# Patient Record
Sex: Male | Born: 1935 | Race: White | Hispanic: No | Smoking: Former smoker
Health system: Southern US, Community
[De-identification: ages and names within clinical notes are randomized; demographics above are authoritative.]

## PROBLEM LIST (undated history)

## (undated) DIAGNOSIS — K635 Polyp of colon: Secondary | ICD-10-CM

## (undated) DIAGNOSIS — I10 Essential (primary) hypertension: Secondary | ICD-10-CM

## (undated) DIAGNOSIS — R52 Pain, unspecified: Secondary | ICD-10-CM

## (undated) DIAGNOSIS — M5412 Radiculopathy, cervical region: Secondary | ICD-10-CM

## (undated) DIAGNOSIS — N189 Chronic kidney disease, unspecified: Secondary | ICD-10-CM

## (undated) DIAGNOSIS — I4891 Unspecified atrial fibrillation: Secondary | ICD-10-CM

## (undated) DIAGNOSIS — I4510 Unspecified right bundle-branch block: Secondary | ICD-10-CM

## (undated) DIAGNOSIS — Z9981 Dependence on supplemental oxygen: Secondary | ICD-10-CM

## (undated) DIAGNOSIS — J449 Chronic obstructive pulmonary disease, unspecified: Secondary | ICD-10-CM

## (undated) DIAGNOSIS — S8291XA Unspecified fracture of right lower leg, initial encounter for closed fracture: Secondary | ICD-10-CM

## (undated) DIAGNOSIS — K219 Gastro-esophageal reflux disease without esophagitis: Secondary | ICD-10-CM

## (undated) DIAGNOSIS — Z86010 Personal history of colonic polyps: Secondary | ICD-10-CM

## (undated) DIAGNOSIS — C801 Malignant (primary) neoplasm, unspecified: Secondary | ICD-10-CM

## (undated) DIAGNOSIS — K5790 Diverticulosis of intestine, part unspecified, without perforation or abscess without bleeding: Secondary | ICD-10-CM

## (undated) DIAGNOSIS — H269 Unspecified cataract: Secondary | ICD-10-CM

## (undated) DIAGNOSIS — K760 Fatty (change of) liver, not elsewhere classified: Secondary | ICD-10-CM

## (undated) DIAGNOSIS — G459 Transient cerebral ischemic attack, unspecified: Secondary | ICD-10-CM

## (undated) DIAGNOSIS — I82441 Acute embolism and thrombosis of right tibial vein: Secondary | ICD-10-CM

## (undated) DIAGNOSIS — I639 Cerebral infarction, unspecified: Secondary | ICD-10-CM

## (undated) DIAGNOSIS — M1990 Unspecified osteoarthritis, unspecified site: Secondary | ICD-10-CM

## (undated) DIAGNOSIS — R0602 Shortness of breath: Secondary | ICD-10-CM

## (undated) DIAGNOSIS — H9190 Unspecified hearing loss, unspecified ear: Secondary | ICD-10-CM

## (undated) DIAGNOSIS — I8290 Acute embolism and thrombosis of unspecified vein: Secondary | ICD-10-CM

## (undated) DIAGNOSIS — J189 Pneumonia, unspecified organism: Secondary | ICD-10-CM

## (undated) DIAGNOSIS — Z87442 Personal history of urinary calculi: Secondary | ICD-10-CM

## (undated) DIAGNOSIS — H35353 Cystoid macular degeneration, bilateral: Secondary | ICD-10-CM

## (undated) DIAGNOSIS — Z973 Presence of spectacles and contact lenses: Secondary | ICD-10-CM

## (undated) HISTORY — PX: COLONOSCOPY W/ POLYPECTOMY: SHX1380

## (undated) HISTORY — PX: COLONOSCOPY: SHX174

## (undated) HISTORY — DX: Chronic obstructive pulmonary disease, unspecified: J44.9

## (undated) HISTORY — DX: Unspecified atrial fibrillation: I48.91

## (undated) HISTORY — PX: EYE SURGERY: SHX253

## (undated) HISTORY — DX: Polyp of colon: K63.5

## (undated) HISTORY — PX: OTHER SURGICAL HISTORY: SHX169

## (undated) HISTORY — DX: Unspecified cataract: H26.9

## (undated) HISTORY — DX: Fatty (change of) liver, not elsewhere classified: K76.0

## (undated) HISTORY — DX: Radiculopathy, cervical region: M54.12

## (undated) HISTORY — DX: Acute embolism and thrombosis of right tibial vein: I82.441

## (undated) HISTORY — DX: Unspecified right bundle-branch block: I45.10

## (undated) HISTORY — DX: Diverticulosis of intestine, part unspecified, without perforation or abscess without bleeding: K57.90

## (undated) HISTORY — DX: Personal history of colonic polyps: Z86.010

---

## 2004-04-15 ENCOUNTER — Emergency Department (HOSPITAL_COMMUNITY): Admission: EM | Admit: 2004-04-15 | Discharge: 2004-04-15 | Payer: Self-pay | Admitting: Family Medicine

## 2004-04-16 ENCOUNTER — Inpatient Hospital Stay (HOSPITAL_COMMUNITY): Admission: EM | Admit: 2004-04-16 | Discharge: 2004-04-20 | Payer: Self-pay | Admitting: Family Medicine

## 2005-07-13 ENCOUNTER — Emergency Department (HOSPITAL_COMMUNITY): Admission: EM | Admit: 2005-07-13 | Discharge: 2005-07-13 | Payer: Self-pay | Admitting: *Deleted

## 2007-09-13 ENCOUNTER — Ambulatory Visit (HOSPITAL_COMMUNITY): Admission: RE | Admit: 2007-09-13 | Discharge: 2007-09-13 | Payer: Self-pay | Admitting: Orthopedic Surgery

## 2008-08-27 ENCOUNTER — Encounter (INDEPENDENT_AMBULATORY_CARE_PROVIDER_SITE_OTHER): Payer: Self-pay | Admitting: *Deleted

## 2008-10-18 ENCOUNTER — Observation Stay (HOSPITAL_COMMUNITY): Admission: EM | Admit: 2008-10-18 | Discharge: 2008-10-19 | Payer: Self-pay | Admitting: Emergency Medicine

## 2008-10-18 ENCOUNTER — Ambulatory Visit: Payer: Self-pay | Admitting: Cardiology

## 2008-10-18 ENCOUNTER — Ambulatory Visit: Payer: Self-pay | Admitting: *Deleted

## 2009-06-06 ENCOUNTER — Telehealth: Payer: Self-pay | Admitting: Internal Medicine

## 2009-08-12 ENCOUNTER — Emergency Department (HOSPITAL_COMMUNITY): Admission: EM | Admit: 2009-08-12 | Discharge: 2009-08-12 | Payer: Self-pay | Admitting: Family Medicine

## 2009-08-12 ENCOUNTER — Emergency Department (HOSPITAL_COMMUNITY): Admission: EM | Admit: 2009-08-12 | Discharge: 2009-08-12 | Payer: Self-pay | Admitting: Emergency Medicine

## 2009-08-19 ENCOUNTER — Encounter: Admission: RE | Admit: 2009-08-19 | Discharge: 2009-08-19 | Payer: Self-pay | Admitting: Family Medicine

## 2009-11-12 ENCOUNTER — Emergency Department (HOSPITAL_COMMUNITY): Admission: EM | Admit: 2009-11-12 | Discharge: 2009-11-12 | Payer: Self-pay | Admitting: Family Medicine

## 2010-02-05 ENCOUNTER — Emergency Department (HOSPITAL_COMMUNITY): Admission: EM | Admit: 2010-02-05 | Discharge: 2010-02-05 | Payer: Self-pay | Admitting: Family Medicine

## 2010-06-08 ENCOUNTER — Encounter: Payer: Self-pay | Admitting: Internal Medicine

## 2010-06-24 NOTE — Progress Notes (Signed)
Summary: Schedule Colonoscopy  Phone Note Outgoing Call Call back at [facility] Phone [phone]   Call placed by: Harlow Mares CMA Duncan Dull),  June 06, 2009 11:30 AM Call placed to: Patient Summary of Call: Left message on patients machine to call back. patient needs direct colonoscopy if he meets guildlines Initial call taken by: Harlow Mares CMA Duncan Dull),  June 06, 2009 11:31 AM  Follow-up for Phone Call        Left message on patients machine to call back.  Follow-up by: Harlow Mares CMA Duncan Dull),  June 16, 2009 3:35 PM  Additional Follow-up for Phone Call Additional follow up Details #1::        Left message on patients machine to call back.  Additional Follow-up by: Harlow Mares CMA (AAMA),  June 25, 2009 10:38 AM

## 2010-06-25 NOTE — Letter (Signed)
Summary: Pre Visit Letter Revised  [facility]  [address] [place], [place] [address]   Phone: [phone]  Fax: [phone]        06/08/2010 MRN: [id number]  Brendan Hopkins [address] [place], [place]  [address]  [place]             Procedure Date:  07-15-10 9:30am           Recall Colon Dr Leone Payor   Welcome to the Gastroenterology Division at [facility].    You are scheduled to see a nurse for your pre-procedure visit on 07-01-10 at 9:30am on the 3rd floor at [facility], [address]  We ask that you try to arrive at our office 15 minutes prior to your appointment time to allow for check-in.  Please take a minute to review the attached form.  If you answer "Yes" to one or more of the questions on the first page, we ask that you call the person listed at your earliest opportunity.  If you answer "No" to all of the questions, please complete the rest of the form and bring it to your appointment.    Your nurse visit will consist of discussing your medical and surgical history, your immediate family medical history, and your medications.   If you are unable to list all of your medications on the form, please bring the medication bottles to your appointment and we will list them.  We will need to be aware of both prescribed and over the counter drugs.  We will need to know exact dosage information as well.    Please be prepared to read and sign documents such as consent forms, a financial agreement, and acknowledgement forms.  If necessary, and with your consent, a friend or relative is welcome to sit-in on the nurse visit with you.  Please bring your insurance card so that we may make a copy of it.  If your insurance requires a referral to see a specialist, please bring your referral form from your primary care physician.  No co-pay is required for this nurse visit.     If you cannot keep your appointment, please call [phone] to cancel or  reschedule prior to your appointment date.  This allows [place] the opportunity to schedule an appointment for another patient in need of care.    Thank you for choosing [facility] for your medical needs.  We appreciate the opportunity to care for you.  Please visit [place] at our website  to learn more about our practice.  Sincerely, The Gastroenterology Division

## 2010-06-26 ENCOUNTER — Inpatient Hospital Stay (INDEPENDENT_AMBULATORY_CARE_PROVIDER_SITE_OTHER)
Admission: RE | Admit: 2010-06-26 | Discharge: 2010-06-26 | Disposition: A | Discharge status: Home / Self Care | Payer: Medicare PPO | Source: Ambulatory Visit | Attending: Emergency Medicine | Admitting: Emergency Medicine

## 2010-06-26 DIAGNOSIS — S61409A Unspecified open wound of unspecified hand, initial encounter: Secondary | ICD-10-CM

## 2010-06-30 ENCOUNTER — Encounter (INDEPENDENT_AMBULATORY_CARE_PROVIDER_SITE_OTHER): Payer: Self-pay | Admitting: *Deleted

## 2010-07-01 ENCOUNTER — Encounter: Payer: Self-pay | Admitting: Internal Medicine

## 2010-07-09 NOTE — Letter (Signed)
Summary: [facility] Instructions  [facility]  [address] [place], [place] [address]   Phone: [phone]  Fax: [phone]       Brendan Hopkins    11/23/1935    MRN: [id number]        Procedure Day Dorna Bloom:  [facility]  07/15/10     Arrival Time:  8:30AM     Procedure Time:  9:30AM     Location of Procedure:                    [name] _  [facility] (4th Floor)                      PREPARATION FOR COLONOSCOPY WITH MOVIPREP   Starting 5 days prior to your procedure 07/10/10 do not eat nuts, seeds, popcorn, corn, beans, peas,  salads, or any raw vegetables.  Do not take any fiber supplements (e.g. Metamucil, Citrucel, and Benefiber).  THE DAY BEFORE YOUR PROCEDURE         DATE: 07/14/10   DAY: TUESDAY  1.  Drink clear liquids the entire day-NO SOLID FOOD  2.  Do not drink anything colored red or purple.  Avoid juices with pulp.  No orange juice.  3.  Drink at least 64 oz. (8 glasses) of fluid/clear liquids during the day to prevent dehydration and help the prep work efficiently.  CLEAR LIQUIDS INCLUDE: Water Jello Ice Popsicles Tea (sugar ok, no milk/cream) Powdered fruit flavored drinks Coffee (sugar ok, no milk/cream) Gatorade Juice: apple, white grape, white cranberry  Lemonade Clear bullion, consomm, broth Carbonated beverages (any kind) Strained chicken noodle soup Hard Candy                             4.  In the morning, mix first dose of MoviPrep solution:    Empty 1 Pouch A and 1 Pouch B into the disposable container    Add lukewarm drinking water to the top line of the container. Mix to dissolve    Refrigerate (mixed solution should be used within 24 hrs)  5.  Begin drinking the prep at 5:00 p.m. The MoviPrep container is divided by 4 marks.   Every 15 minutes drink the solution down to the next mark (approximately 8 oz) until the full liter is complete.   6.  Follow completed prep with 16 oz of clear liquid of your choice  (Nothing red or purple).  Continue to drink clear liquids until bedtime.  7.  Before going to bed, mix second dose of MoviPrep solution:    Empty 1 Pouch A and 1 Pouch B into the disposable container    Add lukewarm drinking water to the top line of the container. Mix to dissolve    Refrigerate  THE DAY OF YOUR PROCEDURE      DATE: 07/15/10   DAY: WEDNESDAY  Beginning at 4:30AM (5 hours before procedure):         1. Every 15 minutes, drink the solution down to the next mark (approx 8 oz) until the full liter is complete.  2. Follow completed prep with 16 oz. of clear liquid of your choice.    3. You may drink clear liquids until 7:30AM (2 HOURS BEFORE PROCEDURE).   MEDICATION INSTRUCTIONS  Unless otherwise instructed, you should take regular prescription medications with a small sip of water   as early as possible the morning  of your procedure.        OTHER INSTRUCTIONS  You will need a responsible adult at least 73 years of age to accompany you and drive you home.   This person must remain in the waiting room during your procedure.  Wear loose fitting clothing that is easily removed.  Leave jewelry and other valuables at home.  However, you may wish to bring a book to read or  an iPod/MP3 player to listen to music as you wait for your procedure to start.  Remove all body piercing jewelry and leave at home.  Total time from sign-in until discharge is approximately 2-3 hours.  You should go home directly after your procedure and rest.  You can resume normal activities the  day after your procedure.  The day of your procedure you should not:   Drive   Make legal decisions   Operate machinery   Drink alcohol   Return to work  You will receive specific instructions about eating, activities and medications before you leave.    The above instructions have been reviewed and explained to me by   Wyona Almas RN  July 01, 2010 10:04 AM     I fully  understand and can verbalize these instructions _____________________________ Date _________

## 2010-07-09 NOTE — Miscellaneous (Signed)
Summary: [facility] Previsit/prep  Clinical Lists Changes  Medications: Added new medication of MOVIPREP 100 GM  SOLR (PEG-KCL-NACL-NASULF-NA ASC-C) As per prep instructions. - Signed Rx of MOVIPREP 100 GM  SOLR (PEG-KCL-NACL-NASULF-NA ASC-C) As per prep instructions.;  #1 x 0;  Signed;  Entered by: Wyona Almas RN;  Authorized by: Iva Boop MD, [facility];  Method used: Electronically to [facility] [address] [place], [place], [place]  [address], Ph: [phone], Fax: [phone] Observations: Added new observation of NKA: T (07/01/2010 8:58)    Prescriptions: MOVIPREP 100 GM  SOLR (PEG-KCL-NACL-NASULF-NA ASC-C) As per prep instructions.  #1 x 0   Entered by:   Wyona Almas RN   Authorized by:   Iva Boop MD, [facility]   Signed by:   Wyona Almas RN on 07/01/2010   Method used:   Electronically to        [facility]       [address]       [place], [place]  [address]       Ph: [phone]       Fax: [phone]   RxID:   [id number]

## 2010-07-12 ENCOUNTER — Inpatient Hospital Stay (HOSPITAL_COMMUNITY)
Admission: RE | Admit: 2010-07-12 | Discharge: 2010-07-12 | Disposition: A | Discharge status: Home / Self Care | Payer: Medicare PPO | Source: Ambulatory Visit | Attending: Family Medicine | Admitting: Family Medicine

## 2010-07-15 ENCOUNTER — Other Ambulatory Visit: Payer: Self-pay | Admitting: Internal Medicine

## 2010-07-15 ENCOUNTER — Other Ambulatory Visit (AMBULATORY_SURGERY_CENTER): Payer: Medicare PPO | Admitting: Internal Medicine

## 2010-07-15 DIAGNOSIS — D126 Benign neoplasm of colon, unspecified: Secondary | ICD-10-CM

## 2010-07-15 DIAGNOSIS — K5730 Diverticulosis of large intestine without perforation or abscess without bleeding: Secondary | ICD-10-CM

## 2010-07-15 DIAGNOSIS — Z860100 Personal history of colon polyps, unspecified: Secondary | ICD-10-CM

## 2010-07-15 DIAGNOSIS — Z1211 Encounter for screening for malignant neoplasm of colon: Secondary | ICD-10-CM

## 2010-07-15 HISTORY — DX: Personal history of colonic polyps: Z86.010

## 2010-07-15 HISTORY — DX: Personal history of colon polyps, unspecified: Z86.0100

## 2010-07-15 LAB — HM COLONOSCOPY

## 2010-07-21 ENCOUNTER — Encounter: Payer: Self-pay | Admitting: Internal Medicine

## 2010-07-21 NOTE — Procedures (Addendum)
Summary: Colonoscopy  Patient: [name] Note: All result statuses are Final unless otherwise noted.  Tests: (1) Colonoscopy (COL)   COL Colonoscopy           DONE     [facility]     [address]     [place], [place]  [address]           COLONOSCOPY PROCEDURE REPORT           PATIENT:  Brendan, Hopkins  MR#:  [id number]     BIRTHDATE:  11-06-1935, 74 yrs. old  GENDER:  male     ENDOSCOPIST:  Iva Boop, MD, [facility]           PROCEDURE DATE:  07/15/2010     PROCEDURE:  Colonoscopy with biopsy and snare polypectomy     ASA CLASS:  Class II     INDICATIONS:  surveillance and high-risk screening, history of     pre-cancerous (adenomatous) colon polyps diminutive adenoma     removed 2005 (index colonoscopy)     MEDICATIONS:  Fentanyl 50 micrograms IV and Versed 5 milligrams IV           DESCRIPTION OF PROCEDURE:   After the risks benefits and     alternatives of the procedure were thoroughly explained, informed     consent was obtained.  No rectal exam performed. The LB 180AL     [id number] endoscope was introduced through the anus and advanced to     the cecum, which was identified by both the appendix and ileocecal     valve, without limitations.  The quality of the prep was     excellent, using MoviPrep.  The instrument was then slowly     withdrawn as the colon was fully examined. Insertion: 1:38 minutes     Withdrawal: 16:29 minutes     <<PROCEDUREIMAGES>>           FINDINGS:  Two polyps were found. Cecal polyp (7-71mm) and hepatic     flexure polyp 10mm. Cecal  polyp was snared without cautery and     removed with biopsy forceps also on a piecemeal fashion.[facility]     Retrieval was successful. Hepatic flexure polyp was snared, then     cauterized with monopolar cautery. Retrieval was successful.     Moderate diverticulosis was found in the left colon.  This was     otherwise a normal examination of the colon.   Retroflexed views     in the right colon and rectum  revealed no abnormalities.    The     scope was then withdrawn from the patient and the procedure     completed.           COMPLICATIONS:  None     ENDOSCOPIC IMPRESSION:     1) Two polyps removed, largest 10mm     2) Moderate diverticulosis in the left colon     3) Otherwise normal examination, excellent prep     4) Personal history of diminutive adenoma removal 2005     RECOMMENDATIONS:     1) Hold aspirin, aspirin products, and anti-inflammatory     medication for 2 weeks.     REPEAT EXAM:  In for Colonoscopy, pending biopsy results.           Iva Boop, MD, [facility]           CC:  Gilmore Laroche MD     The Patient  n.     eSIGNED:   Iva Boop at 07/15/2010 10:09 AM           Sharyl Nimrod, [id number]  Note: An exclamation mark (!) indicates a result that was not dispersed into the flowsheet. Document Creation Date: 07/15/2010 10:10 AM _______________________________________________________________________  (1) Order result status: Final Collection or observation date-time: 07/15/2010 09:59 Requested date-time:  Receipt date-time:  Reported date-time:  Referring Physician:   Ordering Physician: Stan Head [phone]) Specimen Source:  Source: Launa Grill Order Number: [id number] Lab site:   Appended Document: Colonoscopy     Procedures Next Due Date:    Colonoscopy: 07/2013

## 2010-07-30 NOTE — Letter (Signed)
Summary: Patient Notice- Polyp Results  [facility]  [address] [place], [place] [address]   Phone: [phone]  Fax: [phone]        July 21, 2010 MRN: [id number]    Brendan Hopkins [address] [place], [place]  [address]    Dear Mr. PASTORINO,  The polyps removed from your colon were adenomatous. This means that they were pre-cancerous or that  they had the potential to change into cancer over time.   I recommend that you have a repeat colonoscopy in 3 years to determine if you have developed any new polyps over time and screen for colorectal cancer. If you develop any new rectal bleeding, abdominal pain or significant bowel habit changes, please contact us before then.  Please call us if you are having persistent problems or have questions about your condition that have not been fully answered at this time.  Sincerely,  Iva Boop MD, [facility]  This letter has been electronically signed by your physician.  Appended Document: Patient Notice- Polyp Results letter mailed

## 2010-08-06 LAB — POCT URINALYSIS DIPSTICK
Bilirubin Urine: NEGATIVE
Glucose, UA: NEGATIVE mg/dL
Hgb urine dipstick: NEGATIVE
Ketones, ur: NEGATIVE mg/dL
Nitrite: NEGATIVE
Protein, ur: NEGATIVE mg/dL
Specific Gravity, Urine: 1.025 (ref 1.005–1.030)
Urobilinogen, UA: 0.2 mg/dL (ref 0.0–1.0)
pH: 5.5 (ref 5.0–8.0)

## 2010-08-09 LAB — CULTURE, ROUTINE-ABSCESS
Culture: NO GROWTH
Gram Stain: NONE SEEN

## 2010-08-16 LAB — DIFFERENTIAL
Basophils Absolute: 0 10*3/uL (ref 0.0–0.1)
Basophils Relative: 1 % (ref 0–1)
Eosinophils Absolute: 0.1 10*3/uL (ref 0.0–0.7)
Eosinophils Relative: 1 % (ref 0–5)
Lymphocytes Relative: 22 % (ref 12–46)
Lymphs Abs: 1.7 10*3/uL (ref 0.7–4.0)
Monocytes Absolute: 0.4 10*3/uL (ref 0.1–1.0)
Monocytes Relative: 6 % (ref 3–12)
Neutro Abs: 5.4 10*3/uL (ref 1.7–7.7)
Neutrophils Relative %: 71 % (ref 43–77)

## 2010-08-16 LAB — CBC
HCT: 44.6 % (ref 39.0–52.0)
Hemoglobin: 14.8 g/dL (ref 13.0–17.0)
MCHC: 33.2 g/dL (ref 30.0–36.0)
MCV: 97.2 fL (ref 78.0–100.0)
Platelets: 180 10*3/uL (ref 150–400)
RBC: 4.59 MIL/uL (ref 4.22–5.81)
RDW: 13.3 % (ref 11.5–15.5)
WBC: 7.7 10*3/uL (ref 4.0–10.5)

## 2010-08-16 LAB — POCT I-STAT, CHEM 8
BUN: 19 mg/dL (ref 6–23)
Calcium, Ion: 1.12 mmol/L (ref 1.12–1.32)
Chloride: 107 mEq/L (ref 96–112)
Creatinine, Ser: 0.9 mg/dL (ref 0.4–1.5)
Glucose, Bld: 116 mg/dL — ABNORMAL HIGH (ref 70–99)
HCT: 44 % (ref 39.0–52.0)
Hemoglobin: 15 g/dL (ref 13.0–17.0)
Potassium: 4.1 mEq/L (ref 3.5–5.1)
Sodium: 139 mEq/L (ref 135–145)
TCO2: 24 mmol/L (ref 0–100)

## 2010-09-01 LAB — BASIC METABOLIC PANEL
BUN: 17 mg/dL (ref 6–23)
BUN: 31 mg/dL — ABNORMAL HIGH (ref 6–23)
CO2: 27 mEq/L (ref 19–32)
Calcium: 9.2 mg/dL (ref 8.4–10.5)
Chloride: 110 mEq/L (ref 96–112)
Chloride: 111 mEq/L (ref 96–112)
Creatinine, Ser: 0.88 mg/dL (ref 0.4–1.5)
Creatinine, Ser: 1.39 mg/dL (ref 0.4–1.5)
GFR calc Af Amer: >60 mL/min (ref >60)
GFR calc Af Amer: >60 mL/min (ref >60)
GFR calc non Af Amer: 51 mL/min — ABNORMAL LOW (ref >60)
GFR calc non Af Amer: >60 mL/min (ref >60)
Glucose, Bld: 105 mg/dL — ABNORMAL HIGH (ref 70–99)
Potassium: 4.1 mEq/L (ref 3.5–5.1)
Potassium: 4.1 mEq/L (ref 3.5–5.1)
Sodium: 142 mEq/L (ref 135–145)

## 2010-09-01 LAB — DIFFERENTIAL
Basophils Absolute: 0 10*3/uL (ref 0.0–0.1)
Eosinophils Absolute: 0.3 10*3/uL (ref 0.0–0.7)
Lymphs Abs: 2.2 10*3/uL (ref 0.7–4.0)
Neutrophils Relative %: 55 % (ref 43–77)

## 2010-09-01 LAB — CBC
MCV: 96.7 fL (ref 78.0–100.0)
MCV: 96.8 fL (ref 78.0–100.0)
Platelets: 150 10*3/uL (ref 150–400)
Platelets: 172 10*3/uL (ref 150–400)
Platelets: 176 10*3/uL (ref 150–400)
RBC: 4.24 MIL/uL (ref 4.22–5.81)
RDW: 13.1 % (ref 11.5–15.5)
RDW: 13.2 % (ref 11.5–15.5)
WBC: 6.3 10*3/uL (ref 4.0–10.5)
WBC: 6.9 10*3/uL (ref 4.0–10.5)
WBC: 7.5 10*3/uL (ref 4.0–10.5)

## 2010-09-01 LAB — LIPID PANEL
LDL Cholesterol: 112 mg/dL — ABNORMAL HIGH (ref 0–99)
Triglycerides: 91 mg/dL (ref <150)
VLDL: 18 mg/dL (ref 0–40)

## 2010-09-01 LAB — RPR: RPR Ser Ql: NONREACTIVE

## 2010-09-01 LAB — COMPREHENSIVE METABOLIC PANEL
AST: 23 U/L (ref 0–37)
Albumin: 3.7 g/dL (ref 3.5–5.2)
Alkaline Phosphatase: 67 U/L (ref 39–117)
Chloride: 108 mEq/L (ref 96–112)
GFR calc Af Amer: >60 mL/min (ref >60)
Potassium: 4 mEq/L (ref 3.5–5.1)
Sodium: 138 mEq/L (ref 135–145)
Total Bilirubin: 1.3 mg/dL — ABNORMAL HIGH (ref 0.3–1.2)
Total Protein: 6.5 g/dL (ref 6.0–8.3)

## 2010-09-01 LAB — PROTIME-INR
INR: 1 (ref 0.00–1.49)
Prothrombin Time: 13.7 seconds (ref 11.6–15.2)

## 2010-09-01 LAB — MAGNESIUM: Magnesium: 2.1 mg/dL (ref 1.5–2.5)

## 2010-09-01 LAB — CARDIAC PANEL(CRET KIN+CKTOT+MB+TROPI)
CK, MB: 2.8 ng/mL (ref 0.3–4.0)
CK, MB: 3.3 ng/mL (ref 0.3–4.0)
Relative Index: 2 (ref 0.0–2.5)
Relative Index: 2.1 (ref 0.0–2.5)
Total CK: 141 U/L (ref 7–232)
Troponin I: 0.01 ng/mL (ref 0.00–0.06)
Troponin I: 0.02 ng/mL (ref 0.00–0.06)

## 2010-09-01 LAB — TSH: TSH: 3.144 u[IU]/mL (ref 0.350–4.500)

## 2010-09-01 LAB — CK TOTAL AND CKMB (NOT AT ARMC): Total CK: 147 U/L (ref 7–232)

## 2010-10-06 NOTE — H&P (Signed)
NAMEMORGEN, RITACCO             ACCOUNT NO.:  [id number]   MEDICAL RECORD NO.:  [id number]          PATIENT TYPE:  OBV   LOCATION:  3027                         FACILITY:  [facility]   PHYSICIAN:  Vania Rea, M.D. DATE OF BIRTH:  [birth year]   DATE OF ADMISSION:  10/17/2008  DATE OF DISCHARGE:                              HISTORY & PHYSICAL   PRIMARY CARE PHYSICIAN:  [employer].   CHIEF COMPLAINT:  Episodic dizziness.   HISTORY OF THE PRESENT ILLNESS:  This is a 75 year old Caucasian  gentleman with a history of hypertension and a remote history of TIA  some 5 years ago at that time manifested by transient blurring of vision  with negative subsequent workup.  He was in his baseline state of  health, working outdoors today in the sun, when he had the sudden onset  of dizziness, blurring of vision and difficulty keeping his balance  while walking.  He states this was associated with difficulty focusing  mentally and eventually he went inside his house to lie down.  After  about 5 hours of rest dizziness, he states had improved somewhat, but  his vision was still episodically blurred and he still did not feel  quite right.  Eventually family persuaded him to come to the emergency  room where he was seen and evaluated.  He had a CT scan of the brain  that showed no acute abnormality in the Hospitalist Service was called  to assist with his management.   The patient denies at any time having chest discomfort or any type of  focal weakness.  He had no difficulty swallowing nor difficulty with his  speech; however, he states although he does not feel a hundred percent  right now he cannot identify any specific abnormality at this time.   PAST MEDICAL HISTORY:  The past medical history is significant for:  1. Hypertension.  2. History of TIA.  3  Osteoarthritis.   MEDICATIONS:  1. Lotrel; unknown dose, but takes 1 tablet daily.  2. Aspirin 81 mg daily.  3.  Ibuprofen 200 mg seven times daily.  4. Centrum Silver 1 tablet daily.   ALLERGIES:  No known drug allergies.   SOCIAL HISTORY:  The patient has smoked one-pack per day for the past 55  years.  Denies alcohol or illicit drug use.  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   FAMILY HISTORY:  There is no family history of cardiovascular disease.   REVIEW OF SYSTEMS:  Other than noted above a 10-point review of systems  is significant only for chronic cough for the past 2 years, which is  dry.  There is no history of hemoptysis.   PHYSICAL EXAMINATION:  GENERAL APPEARANCE:  On physical exam the patient  is a pleasant elderly Caucasian man lying on the stretcher and coughing  very frequently.  VITAL SIGNS:  The patient's temperature is 98.1, pulse 62, respirations  16, blood pressure 130/81, and he is saturating at 93% on 2 liters.  GENERAL:  The patient is in no pain.  HEENT:  The pupils are round and equal.  Mucous  membranes are pink.  Anicteric.  NECK:  The neck reveals no cervical lymphadenopathy or thyromegaly.  No  jugular venous distention.  CHEST:  The patient's chest is clear to auscultation bilaterally.  HEART:  Cardiovascular system shows a regular rhythm without murmur.  ABDOMEN:  The patient's abdomen is soft and nontender.  No masses.  EXTREMITIES:  The extremities are without edema.  He has 3+ pulses  bilaterally.  SKIN:  The patient's skin is weather beaten, but there are no rashes or  ulcers.  NEUROLOGIC EXAMINATION:  Central nervous system - cranial nerves II-XII  are grossly intact.  His power is a full grade 5 throughout; and, there  are no focal lateralizing signs.   LABORATORY DATA:  The patient's CBC was reviewed and was completely  normal with a white count of 6.9, hemoglobin 14.5 and platelets 176,000.  Serum chemistries were reviewed and were likewise pretty normal.  His  sodium is 142, potassium 4.1, chloride 110, CO2 27, glucose 105, BUN 31,  creatinine 1.39, and  calcium 9.2.  PT, PTT and INR were all normal.  CT  scan of the brain showed atrophy with minor small vessel chronic  ischemic changes of deep cerebral white matter with no definite acute  intracranial abnormalities.  There was a nonspecific well-circumscribed  lytic focus at the left parietal vertex of about 10 mm in diameter, and  of  uncertain etiology.   ASSESSMENT:  1. Transient ischemic attack.  2. Hypertension, controlled.  3. Chronic tobacco abuse.  4. Nonspecific lytic lesion in the left parietal calvarium   PLAN:  1. We will admit this gentleman for observation and hydration, and      assessment of his cardiovascular risk factors.  2. We will go ahead and get an MRI of brain to further characterize in      particular his occipital portion of the brain.  3. Because of his persistent cough and his a longstanding tobacco      abuse we will get CT imaging of his chest.  4. We will first get a D-dimer and if it is elevated will get a CT      angiogram, and if it is not elevated we will get CT scan of his      chest with contrast.  5. Other plans will be as per orders.      Vania Rea, M.D.  Electronically Signed     LC/MEDQ  D:  10/18/2008  T:  10/18/2008  Job:  [id number]   cc:   [facility]

## 2010-10-06 NOTE — Discharge Summary (Signed)
NAMEMARKEVION, LATTIN             ACCOUNT NO.:  [id number]   MEDICAL RECORD NO.:  [id number]          PATIENT TYPE:  OBV   LOCATION:  3027                         FACILITY:  [facility]   PHYSICIAN:  Peggye Pitt, M.D. DATE OF BIRTH:  June 16, 1935   DATE OF ADMISSION:  10/17/2008  DATE OF DISCHARGE:  10/19/2008                               DISCHARGE SUMMARY   DISCHARGE DIAGNOSES:  1. Transient ischemic attack.  2. Hypertension.  3. History of osteoarthritis.  4. Tobacco abuse.  5. A 4-mm right lower lobe lung nodule.   DISCHARGE MEDICATIONS:  1. Aspirin 81 mg daily.  2. Lisinopril 20 mg daily.  3. Nicotine patch 21 mg x1 week, then 14 mg x1 week, then 7 mg x1      week, changing daily.   DISPOSITION AND FOLLOWUP:  Brendan Hopkins was discharged home in stable  condition.  We recommend a followup CT scan of his chest in 1 year to  follow up on his pulmonary nodule.   CONSULTATIONS THIS HOSPITALIZATION:  None.   IMAGES AND PROCEDURES:  1. A CT scan of the head without contrast on Oct 17, 2008, that showed      atrophy with minor small-vessel chronic ischemic changes of the      deep cerebral white matter.  No definite acute intracranial      abnormalities.  Tiny nonspecific lucent lesions at the left      parietal calvarium at the vertex.  2. A CT scan of the chest with contrast on Oct 18, 2008, that showed a      mildly enlarged right hilar lymph node of uncertain etiology.  A 4-      mm right lower lobe pulmonary nodule.  Recommend followup CT chest      in 1 year.  3. An MRI scan of the brain and neck on Oct 18, 2008, that showed no      acute or reversible process.  Minor chronic small vessel changes      within the hemispheric white matter.  Normal intracranial MRA of      the large and medium-sized vessels.  4. MRA of the neck was negative with carotid bifurcation that appeared      normal.  Vertebral arteries appeared widely patent.   HISTORY AND PHYSICAL EXAM:  For full  details, please see dictation on  Oct 18, 2008, by Dr. Orvan Falconer, but in brief Mr. Schack is a very  pleasant 75 year old Caucasian man, who has a remote history of a TIA  and hypertension, who presented with transient blurring of vision at  that time.  Today, he was working outdoors in the son when he had sudden  onset of dizziness, blurry vision, and difficulty keeping his balance  while walking.  This persisted for about 5 hours.  Later, his family  members persuaded him to come into the emergency department and we were  asked to admit him for further evaluation and management.   HOSPITAL COURSE:  1. For his transient ischemic attack, his MRI started not show any      evidence for CVA.  He was started on aspirin.  He was risk      stratified with a fasting lipid profile that showed a total      cholesterol 166, triglycerides of 91, HDL of 36, and LDL of 112.      Given his only coronary artery disease risk factor is hypertension,      we will not start him on a statin at this time and I have      recommended lifestyle modification for him.  He also had carotid      Dopplers and a 2-D echocardiogram which were both within normal      limits.  He will be discharged home today in stable condition to      follow up with his primary care physician.  2. For his hypertension, he has maintained good blood pressure control      in the hospital.  3. For his tobacco abuse, he has requested assistant with smoking      cessation.  He would prefer to use the patch and I have given him a      prescription.  As for his right lower lobe pulmonary nodule, given      its small size, radiologist has recommended a repeat scan of his      chest in 1 year.  If at that time nodular has increased in size,      then further workup will ensue.  4. Rest of chronic medical issues have not been a problem this      hospitalization.   DISCHARGE VITAL SIGNS:  Blood pressure 138/72, heart rate 60,  respirations  16, and O2 sats 97% on room air with a temp of 98.1.   DISCHARGE LABORATORY DATA:  Sodium 141, potassium 4.1, chloride 111,  bicarb 24, BUN 17, and creatinine 0.88 with a glucose of 95.  WBC 6.3,  hemoglobin 14.2, and a platelet count of 150.      Peggye Pitt, M.D.  Electronically Signed     EH/MEDQ  D:  10/19/2008  T:  10/20/2008  Job:  [id number]   cc:   [facility]

## 2010-10-09 NOTE — Consult Note (Signed)
Brendan Hopkins, Brendan Hopkins             ACCOUNT NO.:  [id number]   MEDICAL RECORD NO.:  [id number]          PATIENT TYPE:  INP   LOCATION:  5031                         FACILITY:  [facility]   PHYSICIAN:  Adolph Pollack, M.D.DATE OF BIRTH:  18-May-1936   DATE OF CONSULTATION:  04/18/2004  DATE OF DISCHARGE:                                   CONSULTATION   REFERRING PHYSICIAN:  Hollice Espy, M.D.   REASON FOR PROCEDURE:  Left forearm cellulitis.   HISTORY OF PRESENT ILLNESS:  Mr. 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  He sustained  a number of scratches and scrapes to his upper extremities.  After he  returned he noticed some swelling, erythema and pain in his left forearm.  He initially was treated with at Urgent Care with oral antibiotics and a  shot of antibiotics.  However, he continued to have progression of his  cellulitis in the left forearm and it was subsequently admitted to the  hospital.  While in the hospital, he had been placed on intravenous  antibiotics and a wound culture demonstrates strep pyogenes.  However, he is  not completely cleared of the cellulitis and there is some concern about  undrained infection.  He did have a CT scan April 16, 2004 of the left  forearm which demonstrated findings consistent with the cellulitis but no  abscess.   PAST MEDICAL HISTORY:  1.  Hypertension.  2.  Arthritis.   DRUG ALLERGIES:  None.   MEDICATIONS AT HOME:  Lotrel and ibuprofen.   SOCIAL HISTORY:  He is a smoker of  one to one-and-a-half packs a day.  No  alcohol or drug use.  Married.   FAMILY HISTORY:  Positive for coronary artery disease.   REVIEW OF SYSTEMS:  CARDIAC:  Denies any heart disease.  PULMONARY:  No  chronic lung disease or asthma.  NEUROLOGIC:  No strokes or seizures.  HEMATOLOGIC:  No bleeding disorders.  ENDOCRINE:  No diabetes or thyroid disease.   REVIEW OF SYSTEMS:  CARDIAC:   She denies any heart disease, heart valve  disease or murmur.  RESPIRATORY:  She says she has hay fever.  She denies  pneumonia, asthma, emphysema.  GI:  Denies peptic ulcer disease, hepatitis,  diverticulitis although she states she has diverticulosis.  No blood in the  stool.  GU:  No kidney stones or bladder problems.  HEMATOLOGIC:  No  bleeding disorders.  No transfusions.  No deep venous thrombosis.  NEUROLOGIC:  No strokes or seizures.  ENDOCRINE:  No sugar diabetes.   PHYSICAL EXAMINATION:  GENERAL:  well-developed, well-nourished male in no  acute distress.  Very pleasant and cooperative.  VITAL SIGNS:  Afebrile with normal vital signs.  MUSCULOSKELETAL:  Examining the upper extremities, the right wrist  demonstrates a small fluctuant area in the medial aspect as well as some  erythema surrounding it.  The left forearm demonstrates dorsal and ventral  erythema with a bolus-type lesion dorsally in the mid forearm region with  some serous drainage and induration  present.  Good peripheral pulses.   DESCRIPTION OF PROCEDURE:  The right wrist area was gently debrided of  overlying necrotic skin and purulent material was drained revealing an open  wound.  The bullous lesion on the left forearm was then gently debrided as  well passively and this leaked some serous fluid but no underlying abscess  was present.   LABORATORY DATA:  White cell count 10,100.  CT scan of the left forearm  demonstrates findings consistent with cellulitis but no abscess.   IMPRESSION:  1.  Right wrist abscess, debrided and drained.  2.  Left forearm cellulitis, debrided.   RECOMMENDATIONS:  I would broaden the antibiotic coverage and add some  vancomycin because of the potential of superinfection.  I would start wound  care twice a day.  I do not see any need for further debridement at this  time.      Tish Men  D:  04/18/2004  T:  04/18/2004  Job:  [id number]   cc:   Hollice Espy,  M.D.

## 2010-10-09 NOTE — Discharge Summary (Signed)
NAMEANTHONIE, Brendan Hopkins             ACCOUNT NO.:  [id number]   MEDICAL RECORD NO.:  [id number]          PATIENT TYPE:  INP   LOCATION:  5031                         FACILITY:  [facility]   PHYSICIAN:  Hollice Espy, M.D.DATE OF BIRTH:  1936-02-15   DATE OF ADMISSION:  04/16/2004  DATE OF DISCHARGE:  04/20/2004                                 DISCHARGE SUMMARY   CONSULTATIONS:  Adolph Pollack, M.D.   DISCHARGE DIAGNOSES:  1.  Left arm cellulitis.  2.  Right wrist abscess.  3.  Hypertension.  4.  History of arthritis.   DISCHARGE MEDICATIONS:  1.  Augmentin 875 mg p.o. b.i.d. x7 days.  2.  Nicotine patch 21 mg topically once a day for 6 weeks, then decrease to      14 mg topically daily for 2 weeks and then 7 mg topically daily for 2      weeks.  The patient was advised not to smoke while on the patch.   The patient was given a prescription for Percocet 5/325 q.6h. p.r.n. for  pain, however, he refused this and decided to go with over-the-counter  analgesics instead.   HISTORY OF PRESENT ILLNESS:  The patient is a 75 year old white male with  past medical history of hypertension who was working on renovating a home  down in [place] when he either cut himself or received an insect bite, he is  not sure which as he suffered many scrapes and insect bites well renovating  his house.  Several days later, the patient's left arm was noted to be  markedly swollen.  He put some ice packs on it originally, but it did not  seem to improve.  He returned to [place] and went to an urgent care  where he received antibiotics and a shot of Rocephin, however, by the next  morning he was having increased swelling and erythema of his left forearm  with some lymphangitic streaking.  He became concerned and came to the  hospital.  There, he was noted to be slightly febrile.  His white count was  elevated at 17.2 with a 87% shift.  He was admitted for cellulitis and  started on IV Zosyn.   In addition, he was continued on his Lotrel for his  hypertension.   HOSPITAL COURSE:  The patient continued to have some slight swelling.  After  further examination his antibiotics were changed over to Ancef and I asked  the surgery service to come and evaluate the patient as he continued to have  some drainage and I was concerned about underlying infection.  A CT of his  arm showed no evidence of any abscess and on further evaluation Dr.  Abbey Chatters recommended that vancomycin be added.  In addition, his left  forearm was drained and showed no evidence of any abscess, however, Dr.  Abbey Chatters was concerned that the patient had some overlying necrotic skin  over his right wrist area and some purulent material.  This area was  debrided as well and did well.  By 04/20/04, the patient was doing much  better.  At this  point, he was on antibiotics for 5 days.  He felt  comfortable and his left arm had greatly decreased swelling and erythema,  and he felt back to his baseline - he was able to move his hand and wrist  freely of both arms.  I am discharging him on  Augmentin for 7 more days to  complete a 2-week course.  The patient was advised to follow up with his PCP  to insure the infection has completely resolved, he tells me he will do so.  He is being discharged to home.   DISPOSITION:  Improved.   DIET:  Low sodium.   ACTIVITY:  As tolerated although I am advising him to keep his wounds clean  and dry and to change his dressings once a day.  The patient tells me he  will do so.      Send   SKK/MEDQ  D:  05/07/2004  T:  05/07/2004  Job:  [id number]   cc:   Adolph Pollack, M.D.  [address], [address]  [place]  [place] [address]  Fax: [phone]

## 2010-10-09 NOTE — H&P (Signed)
NAMESHILO, PAUWELS             ACCOUNT NO.:  [id number]   MEDICAL RECORD NO.:  [id number]          PATIENT TYPE:  INP   LOCATION:  1831                         FACILITY:  [facility]   PHYSICIAN:  Hollice Espy, M.D.DATE OF BIRTH:  Jan 14, 1936   DATE OF ADMISSION:  04/16/2004  DATE OF DISCHARGE:                                HISTORY & PHYSICAL   PRIMARY CARE PHYSICIAN:  None.   CHIEF COMPLAINT:  Left forearm swelling and redness.  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  At that  time ,he did a lot of expensive floor work and in the process of removing  part of the old floor received a what he thought might have been a cut on  his arm.  In addition, he says he was working in a lot of dark corners in  the house and may have been bitten by an insect but he does not recall but  he cannot rule that out as well.  Since that time approximately 5 days ago,  he has had increased arm swelling and redness along with a cut on his left  arm that was draining.  This continued to get worse and worse and finally he  went into an Urgent Care Center yesterday, April 15, 2004.  At that time,  they made a diagnosis of cellulitis.  They outlined the borders of the  cellulitis on his skin on his left forearm, gave him a shot of Rocephin, and  sent him home with a prescription of doxycycline as well as Omnicef.  The  patient woke up this morning with increased pain and by examination the  cellulitis had spread past the borders that were marked and up his arm.  In  addition, he was having some increased drainage as well.  He became  concerned and came into the emergency room here for further evaluation.  On  admission, the patient was noted to have a slightly elevated temp of 99.5.  Most concerning, however, were his lab work.  His white count was noted to  be elevated at 17.3  with an 97% shift.  The patient was felt that he likely  had some form of extensive cellulitis that had not done well in outpatient  therapy.  He was given a dose of Unasyn and wound cultures were drawn.  Currently, he states his arm is still hurting but feels a little bit better  than it did earlier on in the day.  He denies any other complaints such as  headaches, visual changes, dysphagia, chest pain, palpitations, shortness of  breath, wheeze, cough, abdominal pain, hematuria, dysuria, constipation or  diarrhea.  He has, however, not had not much of an appetite.   PAST MEDICAL HISTORY:  Hypertension as well as tobacco abuse.   MEDICATIONS:  He currently is on Lotrel 5/20 mg.  He has recently been on,  as of yesterday, doxycycline and Omnicef.   He had no known drug allergies.  SOCIAL HISTORY:  He denies any drug or alcohol use.  He does, however, smoke  a pack to 1-1/2 packs of cigarettes daily for many years now.   FAMILY HISTORY:  Positive for CAD.   PHYSICAL EXAMINATION:  VITAL SIGNS:  On admission, temp 99.2, blood pressure  139/78, heart rate 75, respirations 14, O2 saturation 97% on room air.  GENERAL:  The patient is alert and oriented x3.  In no apparent distress.  HEENT:  Normocephalic, atraumatic.  His mucous membranes are moist.  NECK:  He has no carotid bruits.  CARDIOVASCULAR:  Regular rate and rhythm.  S1 & S2.  LUNGS:  Clear to auscultation bilaterally.  ABDOMEN:  Soft, nontender, nondistended.  Positive bowel sounds.  EXTREMITIES:  Lower extremities show a trace pitting edema.  His left arm is  markedly swollen especially over the dorsal part of the forearm with a  significant erythema.  His skin is overall quite tanned from being out in  the sun.  He has a slightly elevated warmth which is showing some evidence  of drainage with minimal blistering.  It is slightly tender.  There is an  evidence of a cellulitis extending up his arm in the form of streaking.   On  his right hand he has a small open wound blister which he says started  yesterday.  The patient otherwise is unremarkable.   LABORATORY DATA:  White count of 17.3 with an 87% shift.  H&H 17.9/42 with  MCV of 94, platelet count of 177.  Sodium 138, potassium 4.5, chloride 105,  bicarb 26, BUN 19, creatinine 1.1, glucose 122, calcium 8.9.  Cultures are  drawn and pending.   ASSESSMENT AND PLAN:  1.  Cellulitis of the left forearm.  Will start with IV Zosyn and follow.      If the cellulitis is not improving or it or the white count increased in      the morning we will add vancomycin.  Underlying etiology may be a skin      tear or even a possible spider bite.  Given the drainage, we will go      ahead and check a culture and a CT to rule out abscess.  If there is      something seen on the CT, we will ask surgery to see the patient right      away.  2.  Hypertension.  We will continue Lotrel.      Send   SKK/MEDQ  D:  04/16/2004  T:  04/16/2004  Job:  [id number]

## 2011-01-11 ENCOUNTER — Inpatient Hospital Stay (INDEPENDENT_AMBULATORY_CARE_PROVIDER_SITE_OTHER)
Admission: RE | Admit: 2011-01-11 | Discharge: 2011-01-11 | Disposition: A | Discharge status: Home / Self Care | Payer: Medicare PPO | Source: Ambulatory Visit | Attending: Family Medicine | Admitting: Family Medicine

## 2011-01-11 DIAGNOSIS — B029 Zoster without complications: Secondary | ICD-10-CM

## 2011-11-15 ENCOUNTER — Other Ambulatory Visit: Payer: Self-pay

## 2011-12-01 ENCOUNTER — Encounter (HOSPITAL_COMMUNITY): Payer: Self-pay | Admitting: Emergency Medicine

## 2011-12-01 ENCOUNTER — Emergency Department (HOSPITAL_COMMUNITY): Payer: Medicare PPO

## 2011-12-01 ENCOUNTER — Other Ambulatory Visit: Payer: Self-pay | Admitting: Family Medicine

## 2011-12-01 ENCOUNTER — Observation Stay (HOSPITAL_COMMUNITY)
Admission: EM | Admit: 2011-12-01 | Discharge: 2011-12-03 | Disposition: A | Discharge status: Home / Self Care | Payer: Medicare PPO | Attending: Family Medicine | Admitting: Family Medicine

## 2011-12-01 ENCOUNTER — Observation Stay (HOSPITAL_COMMUNITY): Payer: Medicare PPO

## 2011-12-01 DIAGNOSIS — I10 Essential (primary) hypertension: Secondary | ICD-10-CM

## 2011-12-01 DIAGNOSIS — N289 Disorder of kidney and ureter, unspecified: Secondary | ICD-10-CM | POA: Insufficient documentation

## 2011-12-01 DIAGNOSIS — I4891 Unspecified atrial fibrillation: Secondary | ICD-10-CM | POA: Insufficient documentation

## 2011-12-01 DIAGNOSIS — I452 Bifascicular block: Secondary | ICD-10-CM

## 2011-12-01 DIAGNOSIS — M129 Arthropathy, unspecified: Secondary | ICD-10-CM

## 2011-12-01 DIAGNOSIS — Z8679 Personal history of other diseases of the circulatory system: Secondary | ICD-10-CM

## 2011-12-01 DIAGNOSIS — I679 Cerebrovascular disease, unspecified: Secondary | ICD-10-CM

## 2011-12-01 DIAGNOSIS — R7309 Other abnormal glucose: Secondary | ICD-10-CM | POA: Insufficient documentation

## 2011-12-01 DIAGNOSIS — G459 Transient cerebral ischemic attack, unspecified: Principal | ICD-10-CM | POA: Insufficient documentation

## 2011-12-01 DIAGNOSIS — I959 Hypotension, unspecified: Secondary | ICD-10-CM

## 2011-12-01 DIAGNOSIS — M1990 Unspecified osteoarthritis, unspecified site: Secondary | ICD-10-CM

## 2011-12-01 DIAGNOSIS — R7302 Impaired glucose tolerance (oral): Secondary | ICD-10-CM

## 2011-12-01 DIAGNOSIS — E785 Hyperlipidemia, unspecified: Secondary | ICD-10-CM

## 2011-12-01 HISTORY — DX: Transient cerebral ischemic attack, unspecified: G45.9

## 2011-12-01 HISTORY — DX: Essential (primary) hypertension: I10

## 2011-12-01 LAB — BASIC METABOLIC PANEL
BUN: 40 mg/dL — ABNORMAL HIGH (ref 6–23)
CO2: 25 mEq/L (ref 19–32)
Calcium: 10 mg/dL (ref 8.4–10.5)
GFR calc non Af Amer: 36 mL/min — ABNORMAL LOW (ref >90)
Glucose, Bld: 117 mg/dL — ABNORMAL HIGH (ref 70–99)
Sodium: 139 mEq/L (ref 135–145)

## 2011-12-01 LAB — URINE MICROSCOPIC-ADD ON

## 2011-12-01 LAB — PROTIME-INR
INR: 1.05 (ref 0.00–1.49)
Prothrombin Time: 13.9 seconds (ref 11.6–15.2)

## 2011-12-01 LAB — CBC WITH DIFFERENTIAL/PLATELET
Eosinophils Absolute: 0.2 10*3/uL (ref 0.0–0.7)
Eosinophils Relative: 1 % (ref 0–5)
HCT: 41.8 % (ref 39.0–52.0)
Hemoglobin: 14.9 g/dL (ref 13.0–17.0)
Lymphocytes Relative: 15 % (ref 12–46)
Lymphs Abs: 1.7 10*3/uL (ref 0.7–4.0)
MCH: 32.8 pg (ref 26.0–34.0)
MCV: 92.1 fL (ref 78.0–100.0)
Monocytes Relative: 5 % (ref 3–12)
Platelets: 198 10*3/uL (ref 150–400)
RBC: 4.54 MIL/uL (ref 4.22–5.81)
WBC: 11.2 10*3/uL — ABNORMAL HIGH (ref 4.0–10.5)

## 2011-12-01 LAB — URINALYSIS, ROUTINE W REFLEX MICROSCOPIC
Glucose, UA: NEGATIVE mg/dL
Hgb urine dipstick: NEGATIVE
Protein, ur: 30 mg/dL — AB
Specific Gravity, Urine: 1.024 (ref 1.005–1.030)
Urobilinogen, UA: 0.2 mg/dL (ref 0.0–1.0)

## 2011-12-01 MED ORDER — SODIUM CHLORIDE 0.9 % IV SOLN
Freq: Once | INTRAVENOUS | Status: AC
  Administered 2011-12-01: via INTRAVENOUS

## 2011-12-01 MED ORDER — ASPIRIN 81 MG PO CHEW
324.0000 mg | CHEWABLE_TABLET | Freq: Once | ORAL | Status: AC
  Administered 2011-12-01: 324 mg via ORAL
  Filled 2011-12-01: qty 1

## 2011-12-01 MED ORDER — SODIUM CHLORIDE 0.9 % IV BOLUS (SEPSIS): 1000.0000 mL | Freq: Once | INTRAVENOUS | Status: DC

## 2011-12-01 MED ORDER — HEPARIN SODIUM (PORCINE) 5000 UNIT/ML IJ SOLN
5000.0000 [IU] | Freq: Three times a day (TID) | INTRAMUSCULAR | Status: DC
  Administered 2011-12-01 – 2011-12-03 (×5): 5000 [IU] via SUBCUTANEOUS
  Filled 2011-12-01 (×8): qty 1

## 2011-12-01 MED ORDER — ASPIRIN 325 MG PO TABS
325.0000 mg | ORAL_TABLET | Freq: Every day | ORAL | Status: DC
  Administered 2011-12-01 – 2011-12-03 (×3): 325 mg via ORAL
  Filled 2011-12-01 (×3): qty 1

## 2011-12-01 MED ORDER — ONDANSETRON HCL 4 MG/2ML IJ SOLN: 4.0000 mg | Freq: Once | INTRAMUSCULAR | Status: DC

## 2011-12-01 NOTE — ED Provider Notes (Signed)
History     CSN: [id number]  Arrival date & time 12/01/11  1414   First MD Initiated Contact with Patient 12/01/11 1510      Chief Complaint  Patient presents with  . Atrial Fibrillation  . Nausea    (Consider location/radiation/quality/duration/timing/severity/associated sxs/prior treatment) Patient is a 76 y.o. male presenting with atrial fibrillation. The history is provided by the patient.  Atrial Fibrillation  About 3 hours ago, he was mowing his lawn using a push mower when he started getting hot and sweaty. He switched to a riding mower and continued to mow his lawn, but he continued to feel hot and sweaty. When he came into the house after completing the mottling, he continued to sweat and he seemed to be in a more of a cold sweat. He was feeling dizzy and lightheaded and had associated nausea. His wife noted that he was pale and asked him was the matter and he was unable to speak. She his face looked contorted but could not be sure how it was contorted. He was not aware of any focal weakness. She called 911 and he came to the ED. The pressure was taken at home and was very low for him in that it was 100/50 and he has a history of hypertension. He denies chest pain, heaviness, tightness, pressure. He does have a history of atrial fibrillation. Is not aware of any palpitations. There is no associated dyspnea.  Past Medical History  Diagnosis Date  . Atrial fibrillation   . Hypertension     History reviewed. No pertinent past surgical history.  History reviewed. No pertinent family history.  History  Substance Use Topics  . Smoking status: Former Games developer  . Smokeless tobacco: Not on file  . Alcohol Use: No      Review of Systems  All other systems reviewed and are negative.    Allergies  Review of patient's allergies indicates no known allergies.  Home Medications   Current Outpatient Rx  Name Route Sig Dispense Refill  . AMLODIPINE BESY-BENAZEPRIL HCL 10-40  MG PO CAPS Oral Take 1 capsule by mouth daily.    . ASPIRIN EC 81 MG PO TBEC Oral Take 81 mg by mouth daily.    . IBUPROFEN 200 MG PO TABS Oral Take 200-400 mg by mouth every 6 (six) hours as needed. For pain    . ADULT MULTIVITAMIN W/MINERALS CH Oral Take 1 tablet by mouth daily.    . OMEGA-3-ACID ETHYL ESTERS 1 G PO CAPS Oral Take 1 g by mouth daily.      BP 91/49  Pulse 60  Temp 97.7 F (36.5 C) (Oral)  Resp 17  SpO2 95%  Physical Exam  Nursing note and vitals reviewed.  76 year old male who is resting comfortably and in no acute distress. Vital signs are significant for mild bradycardia with heart rate of 58. Oxygen saturation is 95% which is normal. Blood pressure is normal at 101/70, but low for someone with hypertension. Head is normocephalic and atraumatic. PERRLA, EOMI. Fundi show no hemorrhage, exudate, or papilledema. There is no facial asymmetry. Neck is nontender and supple without adenopathy, JVD, or bruit. Back is nontender. Lungs are clear without rales, wheezes, rhonchi. Heart has regular rhythm with occasional premature beat, no murmur heard. Abdomen is soft, flat, nontender without masses or hepatosplenomegaly. Extremities have no cyanosis or edema, full range of motion is present. Skin is warm and dry without rash. Neurologic: Mental status is normal, speech is normal, cranial nerves  are intact. There are no motor or sensory deficits. There is no pronator drift.  ED Course  Procedures (including critical care time)  Results for orders placed during the hospital encounter of 12/01/11  CBC WITH DIFFERENTIAL      Component Value Range   WBC 11.2 (*) 4.0 - 10.5 K/uL   RBC 4.54  4.22 - 5.81 MIL/uL   Hemoglobin 14.9  13.0 - 17.0 g/dL   HCT 16.1  09.6 - 04.5 %   MCV 92.1  78.0 - 100.0 fL   MCH 32.8  26.0 - 34.0 pg   MCHC 35.6  30.0 - 36.0 g/dL   RDW 40.9  81.1 - 91.4 %   Platelets 198  150 - 400 K/uL   Neutrophils Relative 78 (*) 43 - 77 %   Neutro Abs 8.8 (*) 1.7 - 7.7  K/uL   Lymphocytes Relative 15  12 - 46 %   Lymphs Abs 1.7  0.7 - 4.0 K/uL   Monocytes Relative 5  3 - 12 %   Monocytes Absolute 0.6  0.1 - 1.0 K/uL   Eosinophils Relative 1  0 - 5 %   Eosinophils Absolute 0.2  0.0 - 0.7 K/uL   Basophils Relative 0  0 - 1 %   Basophils Absolute 0.0  0.0 - 0.1 K/uL  BASIC METABOLIC PANEL      Component Value Range   Sodium 139  135 - 145 mEq/L   Potassium 4.5  3.5 - 5.1 mEq/L   Chloride 102  96 - 112 mEq/L   CO2 25  19 - 32 mEq/L   Glucose, Bld 117 (*) 70 - 99 mg/dL   BUN 40 (*) 6 - 23 mg/dL   Creatinine, Ser 7.82 (*) 0.50 - 1.35 mg/dL   Calcium 95.6  8.4 - 21.3 mg/dL   GFR calc non Af Amer 36 (*) >90 mL/min   GFR calc Af Amer 42 (*) >90 mL/min  TROPONIN I      Component Value Range   Troponin I <0.30  <0.30 ng/mL  PROTIME-INR      Component Value Range   Prothrombin Time 13.9  11.6 - 15.2 seconds   INR 1.05  0.00 - 1.49  APTT      Component Value Range   aPTT 31  24 - 37 seconds   Dg Chest 2 View  12/01/2011  *RADIOLOGY REPORT*  Clinical Data: Nausea.  Sweating.  Hypertension.  CHEST - 2 VIEW  Comparison: 02/05/2010  Findings: Heart size is normal.  The aorta shows unfolding. Patient has taken a poor inspiration.  There are hypoaerative changes at the lung bases.  This could simply relate to the poor inspiration or there could be some basilar atelectasis.  Scarring at both lung apices appears the same.  IMPRESSION: Hypoaeration at the lung bases either due to a poor inspiration or basilar atelectasis.  Original Report Authenticated By: Thomasenia Sales, M.D.   Ct Head Wo Contrast  12/01/2011  *RADIOLOGY REPORT*  Clinical Data: 76 year old male with sudden onset of nausea, diaphoresis, atrial fibrillation.  CT HEAD WITHOUT CONTRAST  Technique:  Contiguous axial images were obtained from the base of the skull through the vertex without contrast.  Comparison: Brain MRI 08/12/2009, head CT 10/17/2008.  Findings: Visualized paranasal sinuses and mastoids  are clear. Visualized orbits and scalp soft tissues are within normal limits. No acute osseous abnormality identified.  Stable, normal cerebral volume.  No ventriculomegaly. No midline shift, mass effect, or evidence  of mass lesion.  Patchy periventricular white matter hypodensity may be mildly increased from prior studies. No evidence of cortically based acute infarction identified.  No acute intracranial hemorrhage identified.  No suspicious intracranial vascular hyperdensity.  IMPRESSION: No acute intracranial abnormality.  Negative except for mild nonspecific white matter changes which may have mildly increased since 2010.  Original Report Authenticated By: Harley Hallmark, M.D.      Date: 12/01/2011  Rate: 54  Rhythm: sinus bradycardia and premature atrial contractions (PAC)  QRS Axis: left  Intervals: PR prolonged  ST/T Wave abnormalities: normal  Conduction Disutrbances:first-degree A-V block , right bundle branch block and left anterior fascicular block  Narrative Interpretation: Sinus bradycardia with premature atrial contractions, first degree AV block, right bundle branch block, left anterior fascicular block. When compared with ECG of 08/12/2009, no significant changes are seen.  Old EKG Reviewed: unchanged    1. Transient ischemic attack (TIA)   2. Hypotension   3. Renal insufficiency   4. Bifascicular block       MDM  Episode of dizziness and diaphoresis which may just be related, but with associated nausea, need to consider possibility of angina equivalent. Episode of inability to speak which may been related to hypotension, but needs to be treated as a possible transient ischemic attack. Cardiac and TIA workup will be initiated.  Laboratory workup shows worsening renal function compared with baseline. It is possible that dehydration accounts for all of his symptoms, however, description by his wife certainly seems like transient ischemic attack and he does need a full workup  for that. Also, it is noted that he has bifascicular block with first degree AV block and he probably should be evaluated by a cardiologist for consideration for pacemaker prophylactically. Case is discussed with Toya Smothers NP who agrees to admit the patient      Dione Booze, MD 12/01/11 [phone]

## 2011-12-01 NOTE — Consult Note (Signed)
CARDIOLOGY CONSULT NOTE  Patient ID: Brendan Hopkins MRN: [id number] DOB/AGE: [birth year] 76 y.o.  Admit date: 12/01/2011 Referring Physician Pleas Koch, MD Primary Physician: Leo Grosser, MD Reason for Consultation Abnormal EKG.  HPI: Brendan Hopkins is a 76 year old Caucasian male with no significant prior cardiovascular history except questionable atrial fibrillation in the past which I do not see any documentation going back to 2010 also admitted in 2011 with dizziness and nonspecific symptoms and MRI, MRA of the brain had revealed no significant abnormalities.  He been doing well until yesterday where he unloaded close to 2 tons of scrap building material into the jugular after loading it into the truck.  He was extremely diaphoretic, tired and felt very weak after the job.  He went home and felt nauseous and had profound diaphoresis.  He recuperated from this focal at 4 this morning and went to work came back and was more distal on and again felt very weak and fatigued.  He went inside the house and hydrated himself.  There is suddenly felt very sick to the stomach and felt nauseous and started to have profuse "cold sweats".  He also did not feel well and felt very weak.  Was extremely nauseous and at that time his wife tried to talk to him and thought that his face did not  appear to look right and hence called the EMS. Patient denies any weakness in the extremities, dizziness, syncope.  He denies any visual disturbances.  He denies any chest pain, shortness of breath, PND or orthopnea.  Past Medical History  Diagnosis Date  . Atrial fibrillation   . Hypertension   . TIA (transient ischemic attack)      History reviewed. No pertinent past surgical history.   Family History  Problem Relation Age of Onset  . Cancer Father   . Cancer Mother     died more of old age at age 38    Social History: History   Social History  . Marital Status: Married    Spouse Name: N/A   Number of Children: N/A  . Years of Education: N/A   Occupational History  . Not on file.   Social History Main Topics  . Smoking status: Former Smoker -- 57 years    Types: Cigarettes    Quit date: 05/25/2007  . Smokeless tobacco: Not on file  . Alcohol Use: No  . Drug Use: No  . Sexually Active: Not on file   Other Topics Concern  . Not on file   Social History Narrative  . No narrative on file     Prescriptions prior to admission  Medication Sig Dispense Refill  . amLODipine-benazepril (LOTREL) 10-40 MG per capsule Take 1 capsule by mouth daily.      Marland Kitchen aspirin EC 81 MG tablet Take 81 mg by mouth daily.      Marland Kitchen ibuprofen (ADVIL,MOTRIN) 200 MG tablet Take 200-400 mg by mouth every 6 (six) hours as needed. For pain      . Multiple Vitamin (MULTIVITAMIN WITH MINERALS) TABS Take 1 tablet by mouth daily.      Marland Kitchen omega-3 acid ethyl esters (LOVAZA) 1 G capsule Take 1 g by mouth daily.        ROS: General: no fevers/chills/night sweats. Had "cold sweats" yesterday and today after working. Eyes: no blurry vision, diplopia, or amaurosis ENT: no sore throat or hearing loss Resp: no cough, wheezing, or hemoptysis CV: no edema or palpitations GI: no abdominal pain, diarrhea,  or constipation. Has profound nausea.  GU: no dysuria, frequency, or hematuria Skin: no rash Neuro: no headache, numbness, tingling, or weakness of extremities Musculoskeletal: no joint pain or swelling Heme: no bleeding, DVT, or easy bruising Endo: no polydipsia or polyuria    Physical Exam: Blood pressure 113/60, pulse 56, temperature 98 F (36.7 C), temperature source Oral, resp. rate 19, height 6' (1.829 m), weight 98.521 kg (217 lb 3.2 oz), SpO2 95.00%.  Pt is alert and oriented, WD, WN, in no distress. HEENT: normal Neck: JVP normal. Carotid upstrokes normal without bruits. No thyromegaly. Lungs: equal expansion, clear bilaterally CV: Apex is discrete and nondisplaced, RRR without murmur or  gallop Abd: soft, NT, +BS, no bruit, no hepatosplenomegaly Back: no CVA tenderness Ext: no C/C/E        Femoral pulses 2+= without bruits        DP/PT pulses intact and = Skin: warm and dry without rash Neuro: CNII-XII intact             Strength intact = bilaterally  Labs:   Lab Results  Component Value Date   WBC 11.2* 12/01/2011   HGB 14.9 12/01/2011   HCT 41.8 12/01/2011   MCV 92.1 12/01/2011   PLT 198 12/01/2011    Lab 12/01/11 1526  NA 139  K 4.5  CL 102  CO2 25  BUN 40*  CREATININE 1.79*  CALCIUM 10.0  PROT --  BILITOT --  ALKPHOS --  ALT --  AST --  GLUCOSE 117*   Lab Results  Component Value Date   CKTOTAL 156 10/18/2008   CKMB 3.3 10/18/2008   TROPONINI <0.30 12/01/2011    Lipid Panel     Component Value Date/Time   CHOL  Value: 166        ATP III CLASSIFICATION:  <200     mg/dL   Desirable  161-096  mg/dL   Borderline High  >=045    mg/dL   High        08/30/8117 1145   TRIG 91 10/18/2008 1145   HDL 36* 10/18/2008 1145   CHOLHDL 4.6 10/18/2008 1145   VLDL 18 10/18/2008 1145   LDLCALC  Value: 112        Total Cholesterol/HDL:CHD Risk Coronary Heart Disease Risk Table                     Men   Women  1/2 Average Risk   3.4   3.3  Average Risk       5.0   4.4  2 X Average Risk   9.6   7.1  3 X Average Risk  23.4   11.0        Use the calculated Patient Ratio above and the CHD Risk Table to determine the patient's CHD Risk.        ATP III CLASSIFICATION (LDL):  <100     mg/dL   Optimal  147-829  mg/dL   Near or Above                    Optimal  130-159  mg/dL   Borderline  562-130  mg/dL   High  >865     mg/dL   Very High* 7/84/6962 1145       Radiology: Dg Chest 2 View  12/01/2011  *RADIOLOGY REPORT*  Clinical Data: Nausea.  Sweating.  Hypertension.  CHEST - 2 VIEW  Comparison: 02/05/2010  Findings: Heart size is normal.  The aorta shows unfolding. Patient has taken a poor inspiration.  There are hypoaerative changes at the lung bases.  This could simply relate to  the poor inspiration or there could be some basilar atelectasis.  Scarring at both lung apices appears the same.  IMPRESSION: Hypoaeration at the lung bases either due to a poor inspiration or basilar atelectasis.  Original Report Authenticated By: Thomasenia Sales, M.D.   Ct Head Wo Contrast  12/01/2011  *RADIOLOGY REPORT*  Clinical Data: 76 year old male with sudden onset of nausea, diaphoresis, atrial fibrillation.  CT HEAD WITHOUT CONTRAST  Technique:  Contiguous axial images were obtained from the base of the skull through the vertex without contrast.  Comparison: Brain MRI 08/12/2009, head CT 10/17/2008.  Findings: Visualized paranasal sinuses and mastoids are clear. Visualized orbits and scalp soft tissues are within normal limits. No acute osseous abnormality identified.  Stable, normal cerebral volume.  No ventriculomegaly. No midline shift, mass effect, or evidence of mass lesion.  Patchy periventricular white matter hypodensity may be mildly increased from prior studies. No evidence of cortically based acute infarction identified.  No acute intracranial hemorrhage identified.  No suspicious intracranial vascular hyperdensity.  IMPRESSION: No acute intracranial abnormality.  Negative except for mild nonspecific white matter changes which may have mildly increased since 2010.  Original Report Authenticated By: Ulla Potash III, M.D.    EKG: 12/01/2011: Normal sinus rhythm/sinus bradycardia at rate of 56 bpm.  Right bundle branch block.  Frequent PACs and compensated pause.  No evidence of heart block.  No ischemia.  ASSESSMENT AND PLAN:  1.  Nausea and vomiting and symptoms suggest near syncope probably vasovagal versus profound dehydration. 2.  Acute renal insufficiency secondary to dehydration. 3.  Abnormal EKG showing right bundle branch block and frequent PACs.  No significant heart block (AV block). Recommendation: I will start the patient on IV hydration overnight.  I doubt that this is TIA.  I  suspect this is performed dehydration and exhaustion.  From cardiac standpoint do not recommend any further workup at this time.  Patient will contact me for performing a outpatient routine treadmill exercise stress test to exclude any myocardial ischemia.  Will discharge him home on aspirin 81 mg by mouth daily.  His physical examination is normal.  Pamella Pert, MD 12/01/2011, 11:12 PM

## 2011-12-01 NOTE — Progress Notes (Signed)
Brendan Hopkins, is a 76 y.o. male,   MRN: [id number]  -  DOB - 04-07-1936  Outpatient Primary MD for the patient is [facility] TOM, MD  in for    Chief Complaint  Patient presents with  . Atrial Fibrillation  . Nausea     Blood pressure 91/49, pulse 60, temperature 97.7 F (36.5 C), temperature source Oral, resp. rate 17, SpO2 95.00%.  Principal Problem:  *TIA (transient ischemic attack) Active Problems:  H/O atrial fibrillation without current medication  HTN (hypertension)     76 yo hx HTN, afib , TIA, mowing lawn this am became dizzy, nauseated. Went inside and cooled off after about 1 hour and drinking water, tea and soda. Then sudden diaphoresis with nausea and difficulty speaking for few seconds. Wife reports eyes rolled back in head for few seconds and whole face contorted. VSS . CT head neg for acute abnormality.  Admit for dehydration and TIA workup.

## 2011-12-01 NOTE — ED Notes (Signed)
Patient unable to void at this time

## 2011-12-01 NOTE — ED Notes (Signed)
Pt in from [facility] from home for sudden onset of nausea and diaphoresis.  Pt states he was out mowing earlier this AM and came in--states he'd been drinking water and Dr. Reino Kent to hydrate self.  States came in after getting hot after being done with mowing and sat down, got to feeling worse, then had the sudden onset of of breaking out into cold sweat with bad nausea.  Pt has hx of a-fib.  Pt alert and oriented; states he still is nauseated but not as bad.

## 2011-12-01 NOTE — H&P (Signed)
[facility] History and Physical  Brendan Hopkins:[id number] DOB: [birth year] DOA: 12/01/2011  Referring physician: Ranae Palms PCP: Leo Grosser, MD   Chief Complaint: Brief period of altered mental status  HPI:  76 yr old male was outside yesterday and had an episode where he got really "hot" and had to stop.  Felt more overheated than anything else.  He continued doing this this Morning and was abck out mowing the grass at about 09:00 am and started to [facility] and when he got on the riding mower got real "hot" again and drank some water, Dr. Reino Kent and some tea.  Didn't feel really good and was sitting under the fan and sat there 30-45 minutes.  He started to have some [place] and cold sweats.  His wife tried to ask some clarifying questions and he became aphasic and his eyes rolled back ion his head and she called 911 which lasted for 5-6 seconds. This seems to have happened 5 yrs ago with possible TIa per wife  Review of Systems:  NO Chest pain, had some Lower extremity cramping, checked the blood pressure at home with his monitor and noted it was 100/57 Had some cold sweats as well and was pouring sweat Denies SOB but thinks his breathing changedm,  No fever or cold No stuffiness of nose or anythng else No h/o seizures No abnormal movements of the body, no postictal state however did feel no    Past Medical History  Diagnosis Date  . Atrial fibrillation   . Hypertension   . TIA (transient ischemic attack)    Chart Review Admit 12/15/5 for L arm cellulitis +R Wrist Abscess Admit 10/17/2008 for TIA-at that time had a 4 mm Lung nodule   History reviewed. No pertinent past surgical history. Social History:  reports that he quit smoking about 4 years ago. His smoking use included Cigarettes. He quit after 57 years of use. He does not have any smokeless tobacco history on file. He reports that he does not drink alcohol or use illicit drugs.  No Known Allergies  Family  History  Problem Relation Age of Onset  . Cancer Father   . Cancer Mother     died more of old age at age 28    Prior to Admission medications   Medication Sig Start Date End Date Taking? Authorizing Provider  amLODipine-benazepril (LOTREL) 10-40 MG per capsule Take 1 capsule by mouth daily.   Yes Historical Provider, MD  aspirin EC 81 MG tablet Take 81 mg by mouth daily.   Yes Historical Provider, MD  ibuprofen (ADVIL,MOTRIN) 200 MG tablet Take 200-400 mg by mouth every 6 (six) hours as needed. For pain   Yes Historical Provider, MD  Multiple Vitamin (MULTIVITAMIN WITH MINERALS) TABS Take 1 tablet by mouth daily.   Yes Historical Provider, MD  omega-3 acid ethyl esters (LOVAZA) 1 G capsule Take 1 g by mouth daily.   Yes Historical Provider, MD   Physical Exam: Filed Vitals:   12/01/11 1415 12/01/11 1447  BP: 101/70 91/49  Pulse: 58 60  Temp: 97.7 F (36.5 C)   TempSrc: Oral   Resp: 18 17  SpO2: 95% 95%     General:  Alert pleasant oriented Caucasian male, poor dentition  Eyes: No arcus senilis some mild pallor  ENT: NAD no thyroid megaly no category  Neck: soft non-tender  Cardiovascular: S1-S2 no murmur rub or gallop.  Respiratory: Clinically clear no added sound  Abdomen: Soft nontender nondistended no rebound  or guarding bowel sounds are  Skin: No lower extremity pitting edema  Musculoskeletal: Range of motion intact  Psychiatric: Euthymic  Neurologic: Cranial 2 through 12 grossly intact finger-nose-finger test normal. Strength 5 out of 5 bilaterally in upper and lower extremities sensation intact no other cerebellar signs noted gait not assessed  Labs on Admission:  Basic Metabolic Panel:  Lab 12/01/11 1610  NA 139  K 4.5  CL 102  CO2 25  GLUCOSE 117*  BUN 40*  CREATININE 1.79*  CALCIUM 10.0  MG --  PHOS --   Liver Function Tests: No results found for this basename: AST:5,ALT:5,ALKPHOS:5,BILITOT:5,PROT:5,ALBUMIN:5 in the last 168 hours No  results found for this basename: LIPASE:5,AMYLASE:5 in the last 168 hours No results found for this basename: AMMONIA:5 in the last 168 hours CBC:  Lab 12/01/11 1526  WBC 11.2*  NEUTROABS 8.8*  HGB 14.9  HCT 41.8  MCV 92.1  PLT 198   Cardiac Enzymes:  Lab 12/01/11 1526  CKTOTAL --  CKMB --  CKMBINDEX --  TROPONINI <0.30   BNP: No components found with this basename: POCBNP:5 CBG: No results found for this basename: GLUCAP:5 in the last 168 hours  Radiological Exams on Admission: Dg Chest 2 View  12/01/2011  *RADIOLOGY REPORT*  Clinical Data: Nausea.  Sweating.  Hypertension.  CHEST - 2 VIEW  Comparison: 02/05/2010  Findings: Heart size is normal.  The aorta shows unfolding. Patient has taken a poor inspiration.  There are hypoaerative changes at the lung bases.  This could simply relate to the poor inspiration or there could be some basilar atelectasis.  Scarring at both lung apices appears the same.  IMPRESSION: Hypoaeration at the lung bases either due to a poor inspiration or basilar atelectasis.  Original Report Authenticated By: Thomasenia Sales, M.D.   Ct Head Wo Contrast  12/01/2011  *RADIOLOGY REPORT*  Clinical Data: 76 year old male with sudden onset of nausea, diaphoresis, atrial fibrillation.  CT HEAD WITHOUT CONTRAST  Technique:  Contiguous axial images were obtained from the base of the skull through the vertex without contrast.  Comparison: Brain MRI 08/12/2009, head CT 10/17/2008.  Findings: Visualized paranasal sinuses and mastoids are clear. Visualized orbits and scalp soft tissues are within normal limits. No acute osseous abnormality identified.  Stable, normal cerebral volume.  No ventriculomegaly. No midline shift, mass effect, or evidence of mass lesion.  Patchy periventricular white matter hypodensity may be mildly increased from prior studies. No evidence of cortically based acute infarction identified.  No acute intracranial hemorrhage identified.  No suspicious  intracranial vascular hyperdensity.  IMPRESSION: No acute intracranial abnormality.  Negative except for mild nonspecific white matter changes which may have mildly increased since 2010.  Original Report Authenticated By: Harley Hallmark, M.D.    EKG: Independently reviewed. EKG done 12/01/11 = first degree AV block axis rightward QRS -50 = left anterior fascicular block right bundle branch block pattern noted with some evidence of strain V4 through V6. PACs noted throughout overall abnormal EKG  Assessment/Plan Principal Problem:  *TIA (transient ischemic attack) Active Problems:  H/O atrial fibrillation without current medication  HTN (hypertension)  Arthritis   1. Possible TIA-will rule out for TIA with MRI of brain, carotid Dopplers, echocardiogram and other workup. We'll consult neurology 2. Significant volume depletion likely secondary to heat exhaustion/stroke-continue IV fluids at 125 cc an hour. Repeat be met in the morning. 3. EKG abnormalities-cycle cardiac enzymes given this could be an issue equivalent. I have consulted Dr. Jacinto Halim to  assess patient. I doubt this is a high degree arrhythmia but given his right bundle branch and left acute fascicular block I believe it is very reasonable to get an echocardiogram 4. History of hypertension-will discontinue for now his hypertensive regimen. Patient was on an ACE and Dyazide which would worsen his renal insufficiency. 5. Acute kidney injury secondary to heat exhaustion-see above 6. Questioner history atrial fibrillation-patient has been told he has an arrhythmia. There is no mention of him ever having it fibrillation past notes.  Code Status: DO NOT RESUSCITATE Family Communication: SPoke w Chip Boer scott-Thoen at bedside, Mobile [phone] Disposition Plan: Observation team #4  Rhetta Mura [facility] Pager [phone]  If 7PM-7AM, please contact night-coverage [email or web address] Password [facility] 12/01/2011, 5:31  PM

## 2011-12-01 NOTE — ED Notes (Signed)
[facility] dr. Burnett Harry gave verbal order to disregard the swallow screen test and give patient something to drink.  Patient was given a coke with ice.  No difficulty swallowing witnessed or reported.

## 2011-12-02 ENCOUNTER — Observation Stay (HOSPITAL_COMMUNITY): Payer: Medicare PPO

## 2011-12-02 ENCOUNTER — Ambulatory Visit (HOSPITAL_COMMUNITY): Payer: Medicare PPO

## 2011-12-02 DIAGNOSIS — R7302 Impaired glucose tolerance (oral): Secondary | ICD-10-CM

## 2011-12-02 DIAGNOSIS — I10 Essential (primary) hypertension: Secondary | ICD-10-CM

## 2011-12-02 DIAGNOSIS — I452 Bifascicular block: Secondary | ICD-10-CM

## 2011-12-02 DIAGNOSIS — E785 Hyperlipidemia, unspecified: Secondary | ICD-10-CM

## 2011-12-02 LAB — CBC
Hemoglobin: 14.4 g/dL (ref 13.0–17.0)
MCH: 32.8 pg (ref 26.0–34.0)
MCHC: 35.5 g/dL (ref 30.0–36.0)

## 2011-12-02 LAB — LIPID PANEL
LDL Cholesterol: 96 mg/dL (ref 0–99)
Triglycerides: 79 mg/dL (ref <150)

## 2011-12-02 LAB — BASIC METABOLIC PANEL
BUN: 39 mg/dL — ABNORMAL HIGH (ref 6–23)
Calcium: 9.1 mg/dL (ref 8.4–10.5)
GFR calc non Af Amer: 55 mL/min — ABNORMAL LOW (ref >90)
Glucose, Bld: 132 mg/dL — ABNORMAL HIGH (ref 70–99)
Potassium: 3.6 mEq/L (ref 3.5–5.1)

## 2011-12-02 LAB — HEMOGLOBIN A1C
Hgb A1c MFr Bld: 6 % — ABNORMAL HIGH (ref <5.7)
Mean Plasma Glucose: 126 mg/dL — ABNORMAL HIGH (ref <117)

## 2011-12-02 LAB — RAPID URINE DRUG SCREEN, HOSP PERFORMED: Benzodiazepines: NOT DETECTED

## 2011-12-02 LAB — CARDIAC PANEL(CRET KIN+CKTOT+MB+TROPI)
CK, MB: 5.3 ng/mL — ABNORMAL HIGH (ref 0.3–4.0)
CK, MB: 4.2 ng/mL — ABNORMAL HIGH (ref 0.3–4.0)
Relative Index: 0.9 (ref 0.0–2.5)
Relative Index: 0.9 (ref 0.0–2.5)
Total CK: 576 U/L — ABNORMAL HIGH (ref 7–232)
Total CK: 452 U/L — ABNORMAL HIGH (ref 7–232)
Troponin I: <0.3 ng/mL (ref <0.30)

## 2011-12-02 LAB — GLUCOSE, CAPILLARY: Glucose-Capillary: 105 mg/dL — ABNORMAL HIGH (ref 70–99)

## 2011-12-02 LAB — MAGNESIUM: Magnesium: 2.2 mg/dL (ref 1.5–2.5)

## 2011-12-02 NOTE — Progress Notes (Signed)
  Echocardiogram 2D Echocardiogram has been performed.  Brendan Hopkins 12/02/2011, 10:44 AM

## 2011-12-02 NOTE — Care Management Note (Signed)
    Page 1 of 1   12/02/2011     2:10:27 PM   CARE MANAGEMENT NOTE 12/02/2011  Patient:  Brendan Hopkins, Brendan Hopkins   Account Number:  [id number]  Date Initiated:  12/02/2011  Documentation initiated by:  Onnie Boer  Subjective/Objective Assessment:   PT WAS ADMITTED WITH TIA     Action/Plan:   PROGRESSION OF CARE AND DISCHARGE PLANNING   Anticipated DC Date:  12/03/2011   Anticipated DC Plan:  HOME/SELF CARE      DC Planning Services  CM consult      Choice offered to / List presented to:             Status of service:  In process, will continue to follow Medicare Important Message given?   (If response is "NO", the following Medicare IM given date fields will be blank) Date Medicare IM given:   Date Additional Medicare IM given:    Discharge Disposition:    Per UR Regulation:  Reviewed for med. necessity/level of care/duration of stay  If discussed at Long Length of Stay Meetings, dates discussed:    Comments:  12/02/11 Onnie Boer, RN, BSN 1408 PT WAS ADMITTED WITH WEAKNESS.  PTA PT WAS AT HOME WITH SELF CARE.  PT IS HAVING A CARDIAC CONSULT.  WILL F/U  ON DC NEEDS.

## 2011-12-02 NOTE — Progress Notes (Signed)
VASCULAR LAB PRELIMINARY  PRELIMINARY  PRELIMINARY  PRELIMINARY  Carotid Dopplers completed.    Preliminary report:  No ICA stenosis.  Vertebral artery flow is antegrade.  Tiea Manninen, 12/02/2011, 3:42 PM

## 2011-12-02 NOTE — Progress Notes (Signed)
Event: RN called this NP 2/2 changes on tele. EKG ordered which was changed from previous today. NP to bedside. S: pt says he feels very well. A "whole lot better" than earlier today. He denies CP, SOB, n/t, weakness, dizziness when OOB or sitting up, HA, vision or speech changes.  O: Well appearing elderly WM in NAD. A&O x 3, pleasant. Card: S1S2 with occasional irregularity. Resp effort is normal. Neuro exam is grossly intact and without noted focal deficit. A/P: 1. Syncope spell-dehydration vs.TIA or cardiac issue. He is asymptomatic at present and has improved since admission. Abnormal EKG-tracing was poor quality. Repeat EKG is the same as earlier today. Cardio reviewed that EKG and has already seen pt and made recommendations. The pt is asymptomatic. Has a hx of Afib but not noted on EKG. Given the negative exam and no EKG changes, I do not see any reason to change the plan of care for tonight.   Maren Reamer, NP [facility]

## 2011-12-02 NOTE — Progress Notes (Signed)
PROGRESS NOTE  Brendan Hopkins WRU:[id number] DOB: Aug 07, 1935 DOA: 12/01/2011 PCP: Leo Grosser, MD  Brief narrative: 76 year old male with history of TIA in 2010 admitted for brief episode of unresponsiveness 7/  Past medical history: Chart Review  Admit 12/15/5 for L arm cellulitis +R Wrist Abscess  Admit 10/17/2008 for TIA-at that time had a 4 mm Lung nodule   Consultants:  Cardiology-Dr. Jacinto Halim  Procedures:   two-view chest x-ray 7/10= hyperaeration and lung base due to poor inspiration or atelectasis  CT scan head 7/10 = no acute intracranial abnormality negative except for mild nonspecific white matter changes which are mildly increased since 2000  MRI head and brain 7/11 = no acute stroke or bleed, atrophy and chronic microvascular ischemic change progress from 2007, no significant intracranial flow reducing lesions or vascular abnormality  Carotid Dopplers mouth neck pending  Echocardiogram report pending  Antibiotics:  None   Subjective  Feels well. Very hungry. No shortness of breath or chest pain. No further weakness or other issues Family in room  Objective    Interim History: Overnight events note-noted cardiology and  Subjective:   Objective: Filed Vitals:   12/01/11 2200 12/02/11 0200 12/02/11 0600 12/02/11 0940  BP: 113/60 124/80 140/73 134/65  Pulse: 56 69 61 64  Temp: 98 F (36.7 C) 98 F (36.7 C) 97.9 F (36.6 C) 97.6 F (36.4 C)  TempSrc: Oral Oral Oral   Resp: 19 18 18 20   Height:      Weight:      SpO2: 95% 96% 99% 97%    Intake/Output Summary (Last 24 hours) at 12/02/11 1247 Last data filed at 12/02/11 1210  Gross per 24 hour  Intake    840 ml  Output    600 ml  Net    240 ml    Exam: General: Alert pleasant oriented Caucasian male, poor dentition  Eyes: No arcus senilis some mild pallor  ENT: NAD no thyroid megaly no category  Neck: soft non-tender  Cardiovascular: S1-S2 no murmur rub or gallop.  Respiratory:  Clinically clear no added sound  Abdomen: Soft nontender nondistended no rebound or guarding bowel sounds are   Psychiatric: Euthymic  Neurologic: Cranial 2 through 12 grossly intact finger-nose-finger test normal. Strength 5 out of 5 bilaterally in upper and lower extremities sensation intact no other cerebellar signs noted gait not assessed   Data Reviewed: Basic Metabolic Panel:  Lab 12/02/11 9147 12/01/11 1526  NA 135 139  K 3.6 4.5  CL 99 102  CO2 22 25  GLUCOSE 132* 117*  BUN 39* 40*  CREATININE 1.26 1.79*  CALCIUM 9.1 10.0  MG 2.2 --  PHOS -- --   Liver Function Tests: No results found for this basename: AST:5,ALT:5,ALKPHOS:5,BILITOT:5,PROT:5,ALBUMIN:5 in the last 168 hours No results found for this basename: LIPASE:5,AMYLASE:5 in the last 168 hours No results found for this basename: AMMONIA:5 in the last 168 hours CBC:  Lab 12/02/11 0200 12/01/11 1526  WBC 7.7 11.2*  NEUTROABS -- 8.8*  HGB 14.4 14.9  HCT 40.6 41.8  MCV 92.5 92.1  PLT 182 198   Cardiac Enzymes:  Lab 12/02/11 0905 12/02/11 0140 12/01/11 1526  CKTOTAL 519* 576* --  CKMB 4.7* 5.3* --  CKMBINDEX -- -- --  TROPONINI <0.30 <0.30 <0.30   BNP: No components found with this basename: POCBNP:5 CBG:  Lab 12/02/11 0646  GLUCAP 107*    No results found for this or any previous visit (from the past 240 hour(s)).  Studies:              All Imaging reviewed and is as per above notation   Scheduled Meds:   . sodium chloride   Intravenous Once  . aspirin  324 mg Oral Once  . aspirin  325 mg Oral Daily  . heparin  5,000 Units Subcutaneous Q8H  . DISCONTD: ondansetron (ZOFRAN) IV  4 mg Intravenous Once  . DISCONTD: sodium chloride  1,000 mL Intravenous Once   Continuous Infusions:    Assessment/Plan:  1. Possible TIA-MRI of brain- carotid Dopplers, echocardiogram and other workup.  We'll consult neurology-I spoke with Dr.Lindzen of Neurology, on 7/11 who reviewed films and the h/o and is not  convinced this was even a TIa and will recommend only ASA for now  2. Significant volume depletion likely secondary to heat exhaustion/stroke-continue IV fluids at 125 cc an hour. Will rpt Bmet in the am 3. EKG abnormalities-appreciate cardiology input. No need for inpatient workup-will need outpatient stress test 4. History of hypertension-will discontinue for now his hypertensive regimen. Patient was on an ACE and Dyazide which would worsen his renal insufficiency.  Might need to add low dose CCB amlodipine 5mg , as blood pressures slightly up 5. Impaired Glucose tolerance-get A1c and determine the need for oral hypoglycemic if result shows 6. Acute kidney injury secondary to heat exhaustion + medications-see above 7. Questioner history atrial fibrillation-patient has been told he has an arrhythmia. There is no mention of him ever having it fibrillation in past notes. 8. HLD-his LDL was 96.  Will start on Low dose Zocor 20 mg  Code Status: DNR Family Communication: Family in room-fully updated Disposition Plan: likely home once work-up complete   Pleas Koch, MD  [facility] Pager [phone] 12/02/2011, 12:47 PM    LOS: 1 day

## 2011-12-02 NOTE — Progress Notes (Signed)
Patient's reading on tele monitoring shows changes from A-fib to first degree block. 12 lead EKG done by Clydie Braun Kirby's orders. Results  texted to her.

## 2011-12-03 ENCOUNTER — Ambulatory Visit (HOSPITAL_COMMUNITY): Payer: Medicare PPO

## 2011-12-03 DIAGNOSIS — R7309 Other abnormal glucose: Secondary | ICD-10-CM

## 2011-12-03 DIAGNOSIS — E785 Hyperlipidemia, unspecified: Secondary | ICD-10-CM

## 2011-12-03 LAB — BASIC METABOLIC PANEL
GFR calc Af Amer: >90 mL/min (ref >90)
GFR calc non Af Amer: 81 mL/min — ABNORMAL LOW (ref >90)
Glucose, Bld: 100 mg/dL — ABNORMAL HIGH (ref 70–99)
Potassium: 4.4 mEq/L (ref 3.5–5.1)
Sodium: 139 mEq/L (ref 135–145)

## 2011-12-03 LAB — HEMOGLOBIN A1C
Hgb A1c MFr Bld: 5.7 % — ABNORMAL HIGH (ref <5.7)
Mean Plasma Glucose: 117 mg/dL — ABNORMAL HIGH (ref <117)

## 2011-12-03 LAB — GLUCOSE, CAPILLARY
Glucose-Capillary: 122 mg/dL — ABNORMAL HIGH (ref 70–99)
Glucose-Capillary: 101 mg/dL — ABNORMAL HIGH (ref 70–99)

## 2011-12-03 MED ORDER — AMLODIPINE BESYLATE 5 MG PO TABS: 5.0000 mg | ORAL_TABLET | Freq: Every day | ORAL | Status: DC

## 2011-12-03 MED ORDER — AMLODIPINE BESYLATE 5 MG PO TABS
5.0000 mg | ORAL_TABLET | Freq: Every day | ORAL | Status: DC
  Administered 2011-12-03: 5 mg via ORAL
  Filled 2011-12-03: qty 1

## 2011-12-03 NOTE — Discharge Summary (Addendum)
Physician Discharge Summary  Brendan Hopkins ZOX:[id number] DOB: 02/02/1936 DOA: 12/01/2011  PCP: Leo Grosser, MD  Admit date: 12/01/2011 Discharge date: 12/03/2011  Recommendations for Outpatient Follow-up:  1. Needs followup of pulmonary nodule 2. Needs to have A1c and lipid panel addressed as an outpatient-might need low-dose metformin/statin 3. Patient will need o a basic metabolic panel in one week  Discharge Diagnoses:  Principal Problem:  *TIA (transient ischemic attack) Active Problems:  H/O atrial fibrillation without current medication  HTN (hypertension)  Arthritis  HLD (hyperlipidemia)  Impaired glucose tolerance   1. 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 He felt tired and nauseous and sweaty and went inside to have a cold drink and then had an 45 second episode of nausea cold sweats and became a few sick for couple of seconds and is to practice the patient came to emergency room and was admitted  Past medical history:  Chart Review  Admit 12/15/5 for L arm cellulitis +R Wrist Abscess  Admit 10/17/2008 for TIA-at that time had a 4 mm Lung nodule   Consultants:  Cardiology-Dr. Jacinto Halim  Procedures:  two-view chest x-ray 7/10= hyperaeration and lung base due to poor inspiration or atelectasis  CT scan head 7/10 = no acute intracranial abnormality negative except for mild nonspecific white matter changes which are mildly increased since 2000  MRI head and brain 7/11 = no acute stroke or bleed, atrophy and chronic microvascular ischemic change progress from 2007, no significant intracranial flow reducing lesions or vascular abnormality  Carotid Dopplers neck negative Echocardiogram 7/11 = systolic function normal no  wall motion abnormalities, no source of emboli noted  Antibiotics:  None  Hospital Course:   1. Possible TIA-MRI of brain- carotid Dopplers, echocardiogram and other workup performed were negative as above telephone consult neurology-I spoke with Dr.Lindzen of Neurology, on 7/11 who reviewed films and the h/o and is not convinced this was even a TIa and will recommend only ASA for now-given his lipid panel was also elevated, but deferred treatment for secondary prevention with a statin as well.  2. Significant volume depletion likely secondary to heat exhaustion/stroke-was on IV fluids at 125 cc an hour. Creatinine trended down to premorbid state 3. EKG abnormalities-appreciate cardiology input. No need for inpatient workup-will need outpatient stress test per patient in PCPs preference. 4. History of hypertension-will discontinue for now his hypertensive regimen. Patient was on an ACE and Dyazide which would worsen his renal insufficiency. This is likely because of his volume depletion and significant sweating. He'll be started on low-dose amlodipine 5 mg. This may need to be uptitrated as an outpatient . 5. Impaired Glucose tolerance-get A1c a- this is still pending at time of discharge. Patient will need followup primary care physician for further issues. 6. Acute kidney injury secondary to heat exhaustion + medications-see above 7. Questioner history atrial fibrillation-patient has been told he has an arrhythmia. There is no mention of him ever having it fibrillation in past notes. 8. HLD-his LDL was 96. Patient would like to defer his next possible take medications I encouraged him a heart healthy diet rich in fruits and vegetables   Discharge Exam: Filed Vitals:   12/03/11 0600  BP: 132/61  Pulse: 57  Temp: 97.6 F (36.4 C)  Resp:  20   Filed Vitals:   12/02/11 1700 12/02/11 2200 12/03/11 0159 12/03/11 0600  BP: 134/84 135/70 175/86 132/61  Pulse: 76 60 64 57  Temp: 97.8 F (36.6  C) 98.2 F (36.8 C) 97.7 F (36.5 C) 97.6 F (36.4 C)  TempSrc: Oral Oral Oral Oral  Resp: 20 20 20 20   Height:      Weight:      SpO2: 98% 97% 98% 100%   General: Alert pleasant oriented Caucasian male, poor dentition  Eyes: No arcus senilis some mild pallor  ENT: NAD no thyroid megaly no category  Neck: soft non-tender  Cardiovascular: S1-S2 no murmur rub or gallop.  Respiratory: Clinically clear no added sound  Abdomen: Soft nontender nondistended no rebound or guarding bowel sounds are  Psychiatric: Euthymic  Neurologic: Cranial 2 through 12 grossly intact finger-nose-finger test normal. Strength 5 out of 5 bilaterally in upper and lower extremities sensation intact no other cerebellar signs noted gait not assessed   Discharge Instructions  Discharge Orders    Future Appointments: Provider: Department: Dept Phone: Center:   12/03/2011 9:00 AM [facility]  None     Future Orders Please Complete By Expires   Diet - low sodium heart healthy      Increase activity slowly      Call MD for:  temperature >100.4      Call MD for:  redness, tenderness, or signs of infection (pain, swelling, redness, odor or green/yellow discharge around incision site)      Call MD for:  severe uncontrolled pain      Call MD for:  persistant nausea and vomiting      Call MD for:  extreme fatigue        Medication List  As of 12/03/2011  8:58 AM   STOP taking these medications         amLODipine-benazepril 10-40 MG per capsule         TAKE these medications         amLODipine 5 MG tablet   Commonly known as: NORVASC   Take 1 tablet (5 mg total) by mouth daily.      aspirin EC 81 MG tablet   Take 81 mg by mouth daily.      ibuprofen 200 MG tablet   Commonly known as: ADVIL,MOTRIN   Take 200-400 mg by mouth every 6 (six) hours as needed. For pain      multivitamin with minerals Tabs   Take 1 tablet by mouth daily.      omega-3 acid ethyl esters 1 G capsule   Commonly known  as: LOVAZA   Take 1 g by mouth daily.           Follow-up Information    Schedule an appointment as soon as possible for a visit with Leo Grosser, MD.   Contact information:   [phone] [place] [address] [phone]           The results of significant diagnostics from this hospitalization (including imaging, microbiology, ancillary and laboratory) are listed below for reference.    Significant Diagnostic Studies: Dg Chest 2 View  12/01/2011  *RADIOLOGY REPORT*  Clinical Data: Nausea.  Sweating.  Hypertension.  CHEST - 2 VIEW  Comparison: 02/05/2010  Findings: Heart size is normal.  The aorta shows unfolding. Patient has taken a poor inspiration.  There are hypoaerative changes at the lung bases.  This could simply relate to the poor  inspiration or there could be some basilar atelectasis.  Scarring at both lung apices appears the same.  IMPRESSION: Hypoaeration at the lung bases either due to a poor inspiration or basilar atelectasis.  Original Report Authenticated By: Thomasenia Sales, M.D.   Ct Head Wo Contrast  12/01/2011  *RADIOLOGY REPORT*  Clinical Data: 76 year old male with sudden onset of nausea, diaphoresis, atrial fibrillation.  CT HEAD WITHOUT CONTRAST  Technique:  Contiguous axial images were obtained from the base of the skull through the vertex without contrast.  Comparison: Brain MRI 08/12/2009, head CT 10/17/2008.  Findings: Visualized paranasal sinuses and mastoids are clear. Visualized orbits and scalp soft tissues are within normal limits. No acute osseous abnormality identified.  Stable, normal cerebral volume.  No ventriculomegaly. No midline shift, mass effect, or evidence of mass lesion.  Patchy periventricular white matter hypodensity may be mildly increased from prior studies. No evidence of cortically based acute infarction identified.  No acute intracranial hemorrhage identified.  No suspicious intracranial vascular hyperdensity.   IMPRESSION: No acute intracranial abnormality.  Negative except for mild nonspecific white matter changes which may have mildly increased since 2010.  Original Report Authenticated By: Harley Hallmark, M.D.   Mri Brain Without Contrast  12/02/2011  *RADIOLOGY REPORT*  Clinical Data:  Possible stroke versus TIA.  Hypertension.  Atrial fibrillation.  Sudden onset of nausea and diaphoresis. Brief period of altered mental status.  MRI HEAD WITHOUT CONTRAST MRA HEAD WITHOUT CONTRAST  Technique:  Multiplanar, multiecho pulse sequences of the brain and surrounding structures were obtained without intravenous contrast. Angiographic images of the head were obtained using MRA technique without contrast.  Comparison:  CT head 12/01/2011.  MR head 08/12/2009  MRI HEAD  Findings:  There is no evidence for acute infarction, intracranial hemorrhage, mass lesion, hydrocephalus, or extra-axial fluid. There is moderate atrophy with chronic microvascular ischemic change.  No midline shift.  Normal pituitary and cerebellar tonsils.  Negative upper cervical region.  No acute sinus or mastoid disease.  IMPRESSION: No acute stroke or bleed.  Atrophy and chronic microvascular ischemic change progressive from 2011.  MRA HEAD  Findings: The internal carotid arteries are widely patent.  Basilar artery is widely patent with left vertebral dominant.  There is no intracranial stenosis or aneurysm.  No cerebellar branch occlusion. Hypoplastic distal right vertebral.  IMPRESSION: No significant intracranial flow reducing lesion or vascular abnormality.  Original Report Authenticated By: Elsie Stain, M.D.   Mr Mra Head/brain Wo Cm  12/02/2011  *RADIOLOGY REPORT*  Clinical Data:  Possible stroke versus TIA.  Hypertension.  Atrial fibrillation.  Sudden onset of nausea and diaphoresis. Brief period of altered mental status.  MRI HEAD WITHOUT CONTRAST MRA HEAD WITHOUT CONTRAST  Technique:  Multiplanar, multiecho pulse sequences of the brain and  surrounding structures were obtained without intravenous contrast. Angiographic images of the head were obtained using MRA technique without contrast.  Comparison:  CT head 12/01/2011.  MR head 08/12/2009  MRI HEAD  Findings:  There is no evidence for acute infarction, intracranial hemorrhage, mass lesion, hydrocephalus, or extra-axial fluid. There is moderate atrophy with chronic microvascular ischemic change.  No midline shift.  Normal pituitary and cerebellar tonsils.  Negative upper cervical region.  No acute sinus or mastoid disease.  IMPRESSION: No acute stroke or bleed.  Atrophy and chronic microvascular ischemic change progressive from 2011.  MRA HEAD  Findings: The internal carotid arteries are widely patent.  Basilar artery is widely patent with left vertebral dominant.  There  is no intracranial stenosis or aneurysm.  No cerebellar branch occlusion. Hypoplastic distal right vertebral.  IMPRESSION: No significant intracranial flow reducing lesion or vascular abnormality.  Original Report Authenticated By: Elsie Stain, M.D.    Microbiology: No results found for this or any previous visit (from the past 240 hour(s)).   Labs: Basic Metabolic Panel:  Lab 12/03/11 2130 12/02/11 0200 12/01/11 1526  NA 139 135 139  K 4.4 3.6 4.5  CL 108 99 102  CO2 22 22 25   GLUCOSE 100* 132* 117*  BUN 19 39* 40*  CREATININE 0.93 1.26 1.79*  CALCIUM 8.6 9.1 10.0  MG -- 2.2 --  PHOS -- -- --   Liver Function Tests: No results found for this basename: AST:5,ALT:5,ALKPHOS:5,BILITOT:5,PROT:5,ALBUMIN:5 in the last 168 hours No results found for this basename: LIPASE:5,AMYLASE:5 in the last 168 hours No results found for this basename: AMMONIA:5 in the last 168 hours CBC:  Lab 12/02/11 0200 12/01/11 1526  WBC 7.7 11.2*  NEUTROABS -- 8.8*  HGB 14.4 14.9  HCT 40.6 41.8  MCV 92.5 92.1  PLT 182 198   Cardiac Enzymes:  Lab 12/02/11 1707 12/02/11 0905 12/02/11 0140 12/01/11 1526  CKTOTAL 452* 519* 576*  --  CKMB 4.2* 4.7* 5.3* --  CKMBINDEX -- -- -- --  TROPONINI <0.30 <0.30 <0.30 <0.30   BNP: BNP (last 3 results) No results found for this basename: PROBNP:3 in the last 8760 hours CBG:  Lab 12/02/11 1208 12/02/11 0646  GLUCAP 105* 107*    Time coordinating discharge: 25 min  Signed:  Rhetta Mura  [facility] 12/03/2011, 8:47 AM

## 2012-07-12 ENCOUNTER — Encounter: Payer: Self-pay | Admitting: *Deleted

## 2012-08-15 ENCOUNTER — Other Ambulatory Visit: Payer: Self-pay | Admitting: Family Medicine

## 2012-08-24 ENCOUNTER — Telehealth: Payer: Self-pay | Admitting: Family Medicine

## 2012-08-24 ENCOUNTER — Other Ambulatory Visit: Payer: Self-pay | Admitting: Family Medicine

## 2012-08-24 MED ORDER — MECLIZINE HCL 25 MG PO TABS: 25.0000 mg | ORAL_TABLET | Freq: Three times a day (TID) | ORAL | Status: DC | PRN

## 2012-08-24 NOTE — Telephone Encounter (Signed)
Meclizine e scribed.

## 2012-08-25 NOTE — Telephone Encounter (Signed)
Pt aware and got med last pm

## 2012-09-22 ENCOUNTER — Ambulatory Visit
Admission: RE | Admit: 2012-09-22 | Discharge: 2012-09-22 | Disposition: A | Discharge status: Home / Self Care | Payer: No Typology Code available for payment source | Source: Ambulatory Visit | Attending: Family Medicine | Admitting: Family Medicine

## 2012-09-22 ENCOUNTER — Encounter: Payer: Self-pay | Admitting: Family Medicine

## 2012-09-22 ENCOUNTER — Ambulatory Visit (INDEPENDENT_AMBULATORY_CARE_PROVIDER_SITE_OTHER): Payer: Medicare Other | Admitting: Family Medicine

## 2012-09-22 VITALS — BP 120/78 | HR 58 | Temp 97.7°F | Resp 18 | Ht 71.0 in | Wt 230.0 lb

## 2012-09-22 DIAGNOSIS — R0989 Other specified symptoms and signs involving the circulatory and respiratory systems: Secondary | ICD-10-CM

## 2012-09-22 DIAGNOSIS — Z0000 Encounter for general adult medical examination without abnormal findings: Secondary | ICD-10-CM

## 2012-09-22 DIAGNOSIS — R0600 Dyspnea, unspecified: Secondary | ICD-10-CM

## 2012-09-22 DIAGNOSIS — Z125 Encounter for screening for malignant neoplasm of prostate: Secondary | ICD-10-CM

## 2012-09-22 DIAGNOSIS — R0609 Other forms of dyspnea: Secondary | ICD-10-CM

## 2012-09-22 LAB — BASIC METABOLIC PANEL
CO2: 26 mEq/L (ref 19–32)
Calcium: 9.7 mg/dL (ref 8.4–10.5)
Chloride: 104 mEq/L (ref 96–112)
Glucose, Bld: 109 mg/dL — ABNORMAL HIGH (ref 70–99)
Potassium: 4.5 mEq/L (ref 3.5–5.3)
Sodium: 139 mEq/L (ref 135–145)

## 2012-09-22 LAB — CBC WITH DIFFERENTIAL/PLATELET
Basophils Absolute: 0 10*3/uL (ref 0.0–0.1)
Basophils Relative: 0 % (ref 0–1)
Eosinophils Absolute: 0.3 10*3/uL (ref 0.0–0.7)
Eosinophils Relative: 4 % (ref 0–5)
HCT: 42.4 % (ref 39.0–52.0)
MCH: 32 pg (ref 26.0–34.0)
MCHC: 34.7 g/dL (ref 30.0–36.0)
MCV: 92.4 fL (ref 78.0–100.0)
Monocytes Absolute: 0.6 10*3/uL (ref 0.1–1.0)
RDW: 13.4 % (ref 11.5–15.5)

## 2012-09-22 LAB — LIPID PANEL
Cholesterol: 170 mg/dL (ref 0–200)
Triglycerides: 79 mg/dL (ref <150)

## 2012-09-22 LAB — HEPATIC FUNCTION PANEL
ALT: 16 U/L (ref 0–53)
AST: 21 U/L (ref 0–37)
Total Protein: 7.2 g/dL (ref 6.0–8.3)

## 2012-09-22 LAB — TSH: TSH: 4.306 u[IU]/mL (ref 0.350–4.500)

## 2012-09-22 MED ORDER — PANTOPRAZOLE SODIUM 40 MG PO TBEC: 40.0000 mg | DELAYED_RELEASE_TABLET | Freq: Every day | ORAL | Status: DC

## 2012-09-22 NOTE — Progress Notes (Signed)
Subjective:    Patient ID: Brendan Hopkins, male    DOB: February 28, 1936, 77 y.o.   MRN: [id number]  HPI  He presents today for his complete physical exam. He also reports pain in his right hip he has been taking 7 Aleve tablets every day for this pain. He denies any stomach upset. He denies any melena. He does report dyspnea on exertion. He works every day in [employer]. He does a lot of manual labor. However over the last several months he notices it becoming more easily winded. He denies chest pain. He denies angina. He denies nausea vomiting. He denies chest pressure. He denies cough or hemoptysis. He does have a 50-pack-year history of smoking. However his past history is also significant for a TIA versus heat stroke in the past raising the concern for ASCVD. He has a reported history of atrial fibrillation. However he is in normal sinus rhythm today with frequent PVCs or PACs. Past Medical History  Diagnosis Date  . Atrial fibrillation   . Hypertension   . TIA (transient ischemic attack)   . RBBB   . COPD (chronic obstructive pulmonary disease)   . Diverticulosis     2005  . Colon polyps     2005   Current Outpatient Prescriptions on File Prior to Visit  Medication Sig Dispense Refill  . amLODipine (NORVASC) 5 MG tablet TAKE ONE TABLET BY MOUTH ONE TIME DAILY  30 tablet  3  . aspirin EC 81 MG tablet Take 81 mg by mouth daily.      . Multiple Vitamin (MULTIVITAMIN WITH MINERALS) TABS Take 1 tablet by mouth daily.       No current facility-administered medications on file prior to visit.   No Known Allergies History   Social History  . Marital Status: Married    Spouse Name: N/A    Number of Children: N/A  . Years of Education: N/A   Occupational History  . Not on file.   Social History Main Topics  . Smoking status: Former Smoker -- 57 years    Types: Cigarettes    Quit date: 05/25/2007  . Smokeless tobacco: Not on file  . Alcohol Use: No  . Drug Use: No  . Sexually  Active: Not on file   Other Topics Concern  . Not on file   Social History Narrative  . No narrative on file   Family History  Problem Relation Age of Onset  . Cancer Father   . Cancer Mother     died more of old age at age 83     Review of Systems  All other systems reviewed and are negative.   Colonoscopy is up to date. Prostate exam is due today. He has had a Pneumovax in 2004 his last tetanus shot was in 2005    Objective:   Physical Exam  Vitals reviewed. Constitutional: He is oriented to person, place, and time. He appears well-developed and well-nourished.  HENT:  Head: Normocephalic and atraumatic.  Right Ear: External ear normal.  Left Ear: External ear normal.  Nose: Nose normal.  Mouth/Throat: Oropharynx is clear and moist.  Eyes: Conjunctivae and EOM are normal. Pupils are equal, round, and reactive to light.  Neck: Normal range of motion. Neck supple. No JVD present. No tracheal deviation present. No thyromegaly present.  Cardiovascular: Normal rate, regular rhythm and intact distal pulses.  Exam reveals gallop (frequent PAC's auscultated). Exam reveals no friction rub.   No murmur heard. Pulmonary/Chest: Effort normal  and breath sounds normal. No respiratory distress. He has no wheezes. He has no rales. He exhibits no tenderness.  Abdominal: Soft. Bowel sounds are normal. He exhibits no distension and no mass. There is no tenderness. There is no rebound and no guarding.  Genitourinary: Rectum normal, prostate normal and penis normal.  Musculoskeletal: Normal range of motion. He exhibits no edema and no tenderness.  Lymphadenopathy:    He has no cervical adenopathy.  Neurological: He is alert and oriented to person, place, and time. He has normal reflexes. He displays normal reflexes. No cranial nerve deficit. He exhibits normal muscle tone. Coordination normal.  Skin: Skin is warm and dry. No rash noted. No erythema. No pallor.  Psychiatric: He has a normal  mood and affect. His behavior is normal. Judgment and thought content normal.          Assessment & Plan:  1. Routine general medical examination at a health care facility Physical exam appears normal.  With pain baseline screening labs pertinent to his physical the - Basic Metabolic Panel - CBC with Differential - Hepatic Function Panel - Lipid Panel - PSA, Medicare - TSH  2. Dyspnea on exertion Check a CBC to rule out anemia. Check a chest x-ray to rule out pulmonary pathology. EKG shows normal sinus rhythm with frequent PACs. He also has Q waves in II, III, aVF raising the concern of inferior ischemia. He also has a first-degree block with a right bundle branch block.  Given his history of TIAs, his history of hypertension, his long history of smoking, I felt he would benefit from a stress test to rule out ischemic cardiomyopathy.  That being said I feel the most likely explanation for his shortness of breath however is early emphysema. His chest x-rays, labs, and stress test are normal, I would proceed with workup for emphysema. - DG Chest 2 View; Future - EKG 12-Lead  3. Prostate cancer screening Patient is normal rectal exam. - PSA, Medicare

## 2012-09-25 NOTE — Progress Notes (Signed)
Pt would like to try diet and exercise to decrease his ldl before trying another medication. Told pt ok and what foods to avoid and to increase his walking that will help bring it down as well and we will recheck his cholesterol in 6 months

## 2012-10-20 ENCOUNTER — Encounter: Payer: Self-pay | Admitting: Cardiology

## 2012-10-20 ENCOUNTER — Ambulatory Visit (INDEPENDENT_AMBULATORY_CARE_PROVIDER_SITE_OTHER): Payer: Medicare Other | Admitting: Cardiology

## 2012-10-20 VITALS — BP 130/60 | HR 60 | Ht 71.5 in | Wt 229.0 lb

## 2012-10-20 DIAGNOSIS — Z01810 Encounter for preprocedural cardiovascular examination: Secondary | ICD-10-CM | POA: Insufficient documentation

## 2012-10-20 DIAGNOSIS — R0989 Other specified symptoms and signs involving the circulatory and respiratory systems: Secondary | ICD-10-CM

## 2012-10-20 DIAGNOSIS — R0600 Dyspnea, unspecified: Secondary | ICD-10-CM | POA: Insufficient documentation

## 2012-10-20 DIAGNOSIS — R0609 Other forms of dyspnea: Secondary | ICD-10-CM

## 2012-10-20 DIAGNOSIS — I10 Essential (primary) hypertension: Secondary | ICD-10-CM

## 2012-10-20 NOTE — Assessment & Plan Note (Signed)
Question related to COPD given history of tobacco abuse. Will arrange Myoview to exclude ischemia particularly in light of need for hip replacement.

## 2012-10-20 NOTE — Assessment & Plan Note (Signed)
Schedule abdominal ultrasound to exclude aneurysm. 

## 2012-10-20 NOTE — Patient Instructions (Addendum)
Your physician recommends that you schedule a follow-up appointment in: AS NEEDED PENDING TEST RESULTS  Your physician has requested that you have an abdominal aorta duplex. During this test, an ultrasound is used to evaluate the aorta. Allow 30 minutes for this exam. Do not eat after midnight the day before and avoid carbonated beverages   Your physician has requested that you have a lexiscan myoview. For further information please visit [email or web address] Please follow instruction sheet, as given.

## 2012-10-20 NOTE — Assessment & Plan Note (Signed)
Continue present medications. 

## 2012-10-20 NOTE — Progress Notes (Signed)
  HPI: 77 year old male for evaluation of dyspnea and preoperative evaluation prior to hip replacement. Patient has a question history of atrial fibrillation but not documented (states occasional "skip beat" in past. Echocardiogram in July of 2013 showed normal LV function and mild left ventricular hypertrophy. The right ventricle was mildly dilated. There was trace aortic insufficiency. Carotid Dopplers in July of 2013 showed no significant internal carotid artery stenosis. Patient has dyspnea with vigorous activities but not routine activities. No orthopnea, PND, pedal edema, chest pain, palpitations or syncope.  Current Outpatient Prescriptions  Medication Sig Dispense Refill  . acetaminophen (TYLENOL) 500 MG tablet Take 500 mg by mouth every 6 (six) hours as needed for pain. 6-7 TABLET DAILY      . amLODipine (NORVASC) 5 MG tablet TAKE ONE TABLET BY MOUTH ONE TIME DAILY  30 tablet  3  . aspirin EC 81 MG tablet Take 81 mg by mouth daily.      . Multiple Vitamin (MULTIVITAMIN WITH MINERALS) TABS Take 1 tablet by mouth daily.      . Naproxen Sodium (ALEVE) 220 MG CAPS Take by mouth as needed.      . Omega-3 Fatty Acids (FISH OIL MAXIMUM STRENGTH) 1200 MG CAPS Take by mouth.      . pantoprazole (PROTONIX) 40 MG tablet Take 1 tablet (40 mg total) by mouth daily.  30 tablet  11   No current facility-administered medications for this visit.    No Known Allergies  Past Medical History  Diagnosis Date  . Hypertension   . TIA (transient ischemic attack)   . RBBB   . COPD (chronic obstructive pulmonary disease)   . Diverticulosis     2005  . Colon polyps     2005    History reviewed. No pertinent past surgical history.  History   Social History  . Marital Status: Married    Spouse Name: N/A    Number of Children: N/A  . Years of Education: N/A   Occupational History  . Not on file.   Social History Main Topics  . Smoking status: Former Smoker -- 57 years    Types: Cigarettes   Quit date: 05/25/2007  . Smokeless tobacco: Not on file  . Alcohol Use: No  . Drug Use: No  . Sexually Active: Not on file   Other Topics Concern  . Not on file   Social History Narrative  . No narrative on file    Family History  Problem Relation Age of Onset  . Cancer Father   . Cancer Mother     died more of old age at age 60    ROS:  pain in right hip but no fevers or chills, productive cough, hemoptysis, dysphasia, odynophagia, melena, hematochezia, dysuria, hematuria, rash, seizure activity, orthopnea, PND, pedal edema, claudication. Remaining systems are negative.  Physical Exam:   Blood pressure 130/60, pulse 60, height 5' 11.5" (1.816 m), weight 229 lb (103.874 kg).  General:  Well developed/well nourished in NAD Skin warm/dry Patient not depressed No peripheral clubbing Back-normal HEENT-normal/normal eyelids Neck supple/normal carotid upstroke bilaterally; no bruits; no JVD; no thyromegaly chest - CTA/ normal expansion CV - RRR/normal S1 and S2; no murmurs, rubs or gallops;  PMI nondisplaced Abdomen -NT/ND, no HSM, no mass, + bowel sounds, positive bruit 2+ femoral pulses, no bruits Ext-trace edema, no chords, 2+ DP Neuro-grossly nonfocal  ECG 09/22/12-sinus rhythm, PACs, first degree AV block, right bundle branch block, left anterior fascicular block.

## 2012-10-20 NOTE — Assessment & Plan Note (Signed)
Await results of Myoview. If negative he may proceed with surgery.

## 2012-10-30 ENCOUNTER — Encounter (INDEPENDENT_AMBULATORY_CARE_PROVIDER_SITE_OTHER): Payer: Medicare Other

## 2012-10-30 ENCOUNTER — Other Ambulatory Visit: Payer: Self-pay | Admitting: Family Medicine

## 2012-10-30 ENCOUNTER — Telehealth: Payer: Self-pay | Admitting: Family Medicine

## 2012-10-30 DIAGNOSIS — R0989 Other specified symptoms and signs involving the circulatory and respiratory systems: Secondary | ICD-10-CM

## 2012-10-30 DIAGNOSIS — R0600 Dyspnea, unspecified: Secondary | ICD-10-CM

## 2012-10-30 DIAGNOSIS — I700 Atherosclerosis of aorta: Secondary | ICD-10-CM

## 2012-10-30 NOTE — Telephone Encounter (Signed)
Ok to refill 

## 2012-10-30 NOTE — Telephone Encounter (Signed)
OK to fill

## 2012-11-01 ENCOUNTER — Ambulatory Visit (HOSPITAL_COMMUNITY): Payer: Medicare Other | Attending: Cardiology | Admitting: Radiology

## 2012-11-01 VITALS — BP 140/75 | HR 56 | Ht 71.5 in | Wt 228.0 lb

## 2012-11-01 DIAGNOSIS — Z8673 Personal history of transient ischemic attack (TIA), and cerebral infarction without residual deficits: Secondary | ICD-10-CM | POA: Insufficient documentation

## 2012-11-01 DIAGNOSIS — Z87891 Personal history of nicotine dependence: Secondary | ICD-10-CM | POA: Insufficient documentation

## 2012-11-01 DIAGNOSIS — E663 Overweight: Secondary | ICD-10-CM | POA: Insufficient documentation

## 2012-11-01 DIAGNOSIS — R0609 Other forms of dyspnea: Secondary | ICD-10-CM | POA: Insufficient documentation

## 2012-11-01 DIAGNOSIS — R0600 Dyspnea, unspecified: Secondary | ICD-10-CM

## 2012-11-01 DIAGNOSIS — R5381 Other malaise: Secondary | ICD-10-CM | POA: Insufficient documentation

## 2012-11-01 DIAGNOSIS — R0602 Shortness of breath: Secondary | ICD-10-CM

## 2012-11-01 DIAGNOSIS — Z01810 Encounter for preprocedural cardiovascular examination: Secondary | ICD-10-CM | POA: Insufficient documentation

## 2012-11-01 DIAGNOSIS — R0989 Other specified symptoms and signs involving the circulatory and respiratory systems: Secondary | ICD-10-CM | POA: Insufficient documentation

## 2012-11-01 DIAGNOSIS — I4510 Unspecified right bundle-branch block: Secondary | ICD-10-CM | POA: Insufficient documentation

## 2012-11-01 DIAGNOSIS — I10 Essential (primary) hypertension: Secondary | ICD-10-CM | POA: Insufficient documentation

## 2012-11-01 MED ORDER — TECHNETIUM TC 99M SESTAMIBI GENERIC - CARDIOLITE
11.0000 | Freq: Once | INTRAVENOUS | Status: AC | PRN
  Administered 2012-11-01: 11 via INTRAVENOUS

## 2012-11-01 MED ORDER — TECHNETIUM TC 99M SESTAMIBI GENERIC - CARDIOLITE
33.0000 | Freq: Once | INTRAVENOUS | Status: AC | PRN
  Administered 2012-11-01: 33 via INTRAVENOUS

## 2012-11-01 MED ORDER — REGADENOSON 0.4 MG/5ML IV SOLN
0.4000 mg | Freq: Once | INTRAVENOUS | Status: AC
  Administered 2012-11-01: 0.4 mg via INTRAVENOUS

## 2012-11-01 NOTE — Progress Notes (Signed)
[facility] SITE 3 NUCLEAR MED [address] [place], [place] [address] [phone]    Cardiology Nuclear Med Study  Brendan Hopkins is a 77 y.o. male     MRN : [id number]     DOB: [birth year]  Procedure Date: 11/01/2012  Nuclear Med Background Indication for Stress Test:  Evaluation for Ischemia and Surgical Clearance for Pending (R) Hip Replacement History:  '13 Echo:normal LVF Cardiac Risk Factors: History of Smoking, Hypertension, Overweight, RBBB and TIA  Symptoms:  DOE and Chronic Fatigue   Nuclear Pre-Procedure Caffeine/Decaff Intake:  None NPO After: 7:00pm   Lungs:  Clear. O2 Sat: 95% on room air. IV 0.9% NS with Angio Cath:  22g  IV Site: R Hand  IV Started by:  Bonnita Levan, RN  Chest Size (in):  46 Cup Size: n/a  Height: 5' 11.5" (1.816 m)  Weight:  228 lb (103.42 kg)  BMI:  Body mass index is 31.36 kg/(m^2). Tech Comments:  AM med's taken.    Nuclear Med Study 1 or 2 day study: 1 day  Stress Test Type:  Lexiscan  Reading MD: Willa Rough, MD  Order Authorizing Provider:  Olga Millers, MD  Resting Radionuclide: Technetium 36m Sestamibi  Resting Radionuclide Dose: 11.0 mCi   Stress Radionuclide:  Technetium 29m Sestamibi  Stress Radionuclide Dose: 33.0 mCi           Stress Protocol Rest HR: 56 Stress HR: 69  Rest BP: 140/75 Stress BP: 140/81  Exercise Time (min): n/a METS: n/a   Predicted Max HR: 146 bpm % Max HR: 47.26 bpm Rate Pressure Product: 9660   Dose of Adenosine (mg):  n/a Dose of Lexiscan: 0.4 mg  Dose of Atropine (mg): n/a Dose of Dobutamine: n/a mcg/kg/min (at max HR)  Stress Test Technologist: Smiley Houseman, CMA-N  Nuclear Technologist:  Domenic Polite, CNMT     Rest Procedure:  Myocardial perfusion imaging was performed at rest 45 minutes following the intravenous administration of Technetium 90m Sestamibi.  Rest ECG: normal sinus rhythm with right bundle branch block  Stress Procedure:  The patient received IV  Lexiscan 0.4 mg over 15-seconds.  Technetium 58m Sestamibi injected at 30-seconds.  He did c/o chest discomfort with Lexiscan.  Quantitative spect images were obtained after a 45 minute delay.  Stress ECG: No significant change from baseline ECG  QPS Raw Data Images:  Patient motion noted; appropriate software correction applied. Stress Images:  There is a medium size defect with moderate decreased activity  affecting the base/mid/apical inferior wall. Rest Images:  There is a small defect with moderate decreased activity affecting the base/mid inferior wall. Subtraction (SDS):  There is a small area of mild reversibility at the apical inferior segment. Transient Ischemic Dilatation (Normal <1.22):  0.97 Lung/Heart Ratio (Normal <0.45):  0.25  Quantitative Gated Spect Images QGS EDV:  135 ml QGS ESV:  57 ml  Impression Exercise Capacity:  Lexiscan with no exercise. BP Response:  Normal blood pressure response. Clinical Symptoms:  He felt different in the chest ECG Impression:  No significant ST segment change suggestive of ischemia. Comparison with Prior Nuclear Study: No previous nuclear study performed  Overall Impression:  This study is abnormal. There is a small area of scar at the base/mid inferior wall. There is slight peri-infarct ischemia. This is a low risk scan.  LV Ejection Fraction: 58%.  LV Wall Motion:  Slight hypokinesis of the inferior wall.  Willa Rough, MD

## 2012-11-02 ENCOUNTER — Other Ambulatory Visit: Payer: Self-pay | Admitting: Family Medicine

## 2012-11-02 NOTE — Telephone Encounter (Signed)
Done 11/02/12

## 2012-11-09 ENCOUNTER — Encounter: Payer: Self-pay | Admitting: Family Medicine

## 2012-11-10 NOTE — Telephone Encounter (Signed)
This encounter was created in error - please disregard.

## 2012-11-20 ENCOUNTER — Ambulatory Visit (INDEPENDENT_AMBULATORY_CARE_PROVIDER_SITE_OTHER): Payer: Medicare Other | Admitting: Physician Assistant

## 2012-11-20 ENCOUNTER — Encounter: Payer: Self-pay | Admitting: Physician Assistant

## 2012-11-20 VITALS — BP 130/72 | HR 68 | Temp 98.2°F | Resp 20 | Wt 230.0 lb

## 2012-11-20 DIAGNOSIS — H6120 Impacted cerumen, unspecified ear: Secondary | ICD-10-CM

## 2012-11-20 DIAGNOSIS — H6123 Impacted cerumen, bilateral: Secondary | ICD-10-CM

## 2012-11-21 NOTE — Progress Notes (Signed)
   Patient ID: Brendan Hopkins MRN: [id number], DOB: September 17, 1935, 77 y.o. Date of Encounter: 11/21/2012, 9:04 AM    Chief Complaint:  Chief Complaint  Patient presents with  . Left ear clogged     HPI: 77 y.o. year old male here for eval b/c his ears feel like they are stopped up and his hearing has sudenly worsened in past few days. Worse on left, but is having sme problem with the right one also.      Home Meds: See attached medication section for any medications that were entered at today's visit. The computer does not put those onto this list.The following list is a list of meds entered prior to today's visit.   Current Outpatient Prescriptions on File Prior to Visit  Medication Sig Dispense Refill  . acetaminophen (TYLENOL) 500 MG tablet Take 500 mg by mouth every 6 (six) hours as needed for pain. 6-7 TABLET DAILY      . amLODipine (NORVASC) 5 MG tablet TAKE ONE TABLET BY MOUTH ONE TIME DAILY  30 tablet  3  . aspirin EC 81 MG tablet Take 81 mg by mouth daily.      . meclizine (ANTIVERT) 25 MG tablet TAKE ONE TABLET BY MOUTH THREE TIMES DAILY AS NEEDED  30 tablet  0  . Multiple Vitamin (MULTIVITAMIN WITH MINERALS) TABS Take 1 tablet by mouth daily.      . Naproxen Sodium (ALEVE) 220 MG CAPS Take by mouth as needed.      . Omega-3 Fatty Acids (FISH OIL MAXIMUM STRENGTH) 1200 MG CAPS Take by mouth.      . pantoprazole (PROTONIX) 40 MG tablet Take 1 tablet (40 mg total) by mouth daily.  30 tablet  11   No current facility-administered medications on file prior to visit.    Allergies: No Known Allergies    Review of Systems: See HPI for pertinent ROS. All other ROS negative.    Physical Exam: Blood pressure 130/72, pulse 68, temperature 98.2 F (36.8 C), temperature source Oral, resp. rate 20, weight 230 lb (104.327 kg)., Body mass index is 31.63 kg/(m^2). General: WNWD WM. Appears in no acute distress. HEENT: Bilateral ear canals are completely obstructed with cerumen.  . Lungs: Clear bilaterally to auscultation without wheezes, rales, or rhonchi. Breathing is unlabored. Heart: Regular rhythm. No murmurs, rubs, or gallops. Psych:  Responds to questions appropriately with a normal affect.     ASSESSMENT AND PLAN:  77 y.o. year old male with  1. Cerumen impaction, bilateral Bilateral ears are irrigated.    Signed, [address] [place], [place], [facility] 11/21/2012 9:04 AM

## 2012-12-11 ENCOUNTER — Other Ambulatory Visit: Payer: Self-pay | Admitting: Family Medicine

## 2012-12-27 ENCOUNTER — Encounter (HOSPITAL_COMMUNITY): Payer: Self-pay | Admitting: Pharmacy Technician

## 2013-01-01 ENCOUNTER — Encounter (HOSPITAL_COMMUNITY)
Admission: RE | Admit: 2013-01-01 | Discharge: 2013-01-01 | Disposition: A | Discharge status: Home / Self Care | Payer: Medicare Other | Source: Ambulatory Visit | Attending: Orthopedic Surgery | Admitting: Orthopedic Surgery

## 2013-01-01 ENCOUNTER — Encounter (HOSPITAL_COMMUNITY): Payer: Self-pay

## 2013-01-01 DIAGNOSIS — Z01818 Encounter for other preprocedural examination: Secondary | ICD-10-CM | POA: Insufficient documentation

## 2013-01-01 DIAGNOSIS — Z01812 Encounter for preprocedural laboratory examination: Secondary | ICD-10-CM | POA: Insufficient documentation

## 2013-01-01 HISTORY — DX: Shortness of breath: R06.02

## 2013-01-01 HISTORY — DX: Gastro-esophageal reflux disease without esophagitis: K21.9

## 2013-01-01 HISTORY — DX: Pain, unspecified: R52

## 2013-01-01 HISTORY — DX: Unspecified osteoarthritis, unspecified site: M19.90

## 2013-01-01 HISTORY — DX: Cerebral infarction, unspecified: I63.9

## 2013-01-01 LAB — URINALYSIS, ROUTINE W REFLEX MICROSCOPIC
Bilirubin Urine: NEGATIVE
Specific Gravity, Urine: 1.026 (ref 1.005–1.030)
pH: 5.5 (ref 5.0–8.0)

## 2013-01-01 LAB — BASIC METABOLIC PANEL
BUN: 23 mg/dL (ref 6–23)
Calcium: 9.6 mg/dL (ref 8.4–10.5)
GFR calc non Af Amer: 81 mL/min — ABNORMAL LOW (ref >90)
Glucose, Bld: 106 mg/dL — ABNORMAL HIGH (ref 70–99)
Sodium: 137 mEq/L (ref 135–145)

## 2013-01-01 LAB — CBC
MCH: 32.4 pg (ref 26.0–34.0)
MCHC: 35.3 g/dL (ref 30.0–36.0)
Platelets: 200 10*3/uL (ref 150–400)

## 2013-01-01 LAB — SURGICAL PCR SCREEN: MRSA, PCR: NEGATIVE

## 2013-01-01 LAB — URINE MICROSCOPIC-ADD ON

## 2013-01-01 LAB — PROTIME-INR: Prothrombin Time: 13 seconds (ref 11.6–15.2)

## 2013-01-01 NOTE — Patient Instructions (Addendum)
YOUR SURGERY IS SCHEDULED AT [facility]  ON:  Tuesday 8/19  REPORT TO [facility] AT:  11:40 AM      PHONE # FOR SHORT STAY IS [phone]  DO NOT EAT ANYTHING AFTER MIDNIGHT THE NIGHT BEFORE YOUR SURGERY.  NO FOOD, NO CHEWING GUM, NO MINTS, NO CANDIES, NO CHEWING TOBACCO. YOU MAY HAVE CLEAR LIQUIDS TO DRINK FROM MIDNIGHT THE NIGHT BEFORE YOUR SURGERY  -  UNTIL 8:30 AM  - LIKE WATER, COFFEE - NO MILK OR MILK PRODUCTS.   NOTHING TO DRINK AFTER 8:30 AM DAY OF SURGERY.  PLEASE TAKE THE FOLLOWING MEDICATIONS THE AM OF YOUR SURGERY WITH A FEW SIPS OF WATER:  NO MEDICATIONS TO TAKE    DO NOT BRING VALUABLES, MONEY, CREDIT CARDS.  DO NOT WEAR JEWELRY, MAKE-UP, NAIL POLISH AND NO METAL PINS OR CLIPS IN YOUR HAIR. CONTACT LENS, DENTURES / PARTIALS, GLASSES SHOULD NOT BE WORN TO SURGERY AND IN MOST CASES-HEARING AIDS WILL NEED TO BE REMOVED.  BRING YOUR GLASSES CASE, ANY EQUIPMENT NEEDED FOR YOUR CONTACT LENS. FOR PATIENTS ADMITTED TO THE HOSPITAL--CHECK OUT TIME THE DAY OF DISCHARGE IS 11:00 AM.  ALL INPATIENT ROOMS ARE PRIVATE - WITH BATHROOM, TELEPHONE, TELEVISION AND WIFI INTERNET.                                 PLEASE READ OVER ANY  FACT SHEETS THAT YOU WERE GIVEN: MRSA INFORMATION, BLOOD TRANSFUSION INFORMATION, INCENTIVE SPIROMETER INFORMATION. FAILURE TO FOLLOW THESE INSTRUCTIONS MAY RESULT IN THE CANCELLATION OF YOUR SURGERY.   PATIENT SIGNATURE_________________________________  PT NOTIFIED HIS SURGERY TIME ON Tuesday 8/19 WAS MOVED UP TO 12:55 PM AND HE NEEDS TO ARRIVE TO [facility] SHORT STAY CENTER BY 9:55 AM.  NOTHING TO EAT AFTER MIDNIGHT - BUT MAY HAVE THE CLEAR LIQUIDS UNTIL 6:30 AM DAY OF SURGERY - NOTHING TO DRINK AFTER 6:30 AM.

## 2013-01-01 NOTE — Pre-Procedure Instructions (Signed)
PT HAS CARDIAC CLEARANCE FOR HIP REPLACEMENT, NUCLEAR STRESS TEST REPORT 11/02/12,  CARDIOLOGY OFFICE NOTE 10/20/12 DR. CRENSHAW - IN EPIC. EKG AND CXR REPORTS ARE IN EPIC FROM 09/22/12.   MEDICAL CLEARANCE FROM DR. PICKARD ON THIS CHART.

## 2013-01-02 LAB — URINE CULTURE

## 2013-01-03 NOTE — Pre-Procedure Instructions (Signed)
PT'S PCR REPORT - POSITIVE STAPH AUREUS, URINALYSIS, URINE MICROSCOPIC, AND URINE CULTURE IN PROCESS REPORTS FAXED TO DR. Nilsa Nutting OFFICE  ON 01/01/13. URINE CULTURE RESULT  - NO GROWTH - FAXED TO DR. Nilsa Nutting OFFICE TODAY 01/03/13.

## 2013-01-07 NOTE — H&P (Signed)
TOTAL HIP ADMISSION H&P  Patient is admitted for right total hip arthroplasty.  Subjective:  Chief Complaint: Right hip OA / pain  HPI: Brendan Hopkins, 77 y.o. male, has a history of pain and functional disability in the right hip(s) due to arthritis and patient has failed non-surgical conservative treatments for greater than 12 weeks to include NSAID's and/or analgesics and activity modification.  Onset of symptoms was gradual starting 1 years ago with rapidlly worsening for a couple of months.The patient noted no past surgery on the right hip(s).  Patient currently rates pain in the right hip at 10 out of 10 with activity. Patient has night pain, worsening of pain with activity and weight bearing, trendelenberg gait, pain that interfers with activities of daily living and pain with passive range of motion. Patient has evidence of periarticular osteophytes and joint space narrowing by imaging studies. This condition presents safety issues increasing the risk of falls. There is no current active infection.  Risks, benefits and expectations were discussed with the patient. Patient understand the risks, benefits and expectations and wishes to proceed with surgery.   D/C Plans:   Home with HHPT/SNF  Post-op Meds:    No Rx given  Tranexamic Acid:   To be given  Decadron:    To be given  FYI:    ASA - post-op  Patient Active Problem List   Diagnosis Date Noted  . Dyspnea 10/20/2012  . Preop cardiovascular exam 10/20/2012  . Bruit 10/20/2012  . HLD (hyperlipidemia) 12/02/2011  . Impaired glucose tolerance 12/02/2011  . TIA (transient ischemic attack) 12/01/2011  . H/O atrial fibrillation without current medication 12/01/2011  . HTN (hypertension) 12/01/2011  . Arthritis 12/01/2011   Past Medical History  Diagnosis Date  . Hypertension   . TIA (transient ischemic attack)   . Diverticulosis     2005  . Colon polyps     2005  . RBBB     POSS HX AF IN PAST- PAC'S  . COPD (chronic  obstructive pulmonary disease)     SMOKED FOR 73 YRS  . Shortness of breath     WITH EXERTION ONLY  . Stroke     FEW YEARS AGO - PT SAYS "HEAT STROKE" X 2 - BUT NO PHYSICAL DEFICITS  . GERD (gastroesophageal reflux disease)   . Pain     RIGHT HIP AND BACK  . Arthritis     RIGHT HIP, HANDS, BACK    Past Surgical History  Procedure Laterality Date  . No previous surgery      No prescriptions prior to admission   No Known Allergies   History  Substance Use Topics  . Smoking status: Former Smoker -- 57 years    Types: Cigarettes    Quit date: 05/25/2007  . Smokeless tobacco: Not on file  . Alcohol Use: No    Family History  Problem Relation Age of Onset  . Cancer Father   . Cancer Mother     died more of old age at age 61     Review of Systems  Constitutional: Negative.   HENT: Positive for hearing loss and tinnitus.   Eyes: Negative.   Respiratory: Positive for shortness of breath.   Cardiovascular: Negative.   Gastrointestinal: Positive for heartburn.  Genitourinary: Positive for urgency and frequency.  Musculoskeletal: Positive for back pain and joint pain.  Skin: Negative.   Neurological: Negative.   Endo/Heme/Allergies: Negative.   Psychiatric/Behavioral: Negative.     Objective:  Physical  Exam  Constitutional: He is oriented to person, place, and time. He appears well-developed and well-nourished.  HENT:  Head: Normocephalic and atraumatic.  Mouth/Throat: Oropharynx is clear and moist. He has dentures.  Eyes: Pupils are equal, round, and reactive to light.  Neck: Neck supple. No JVD present. No tracheal deviation present. No thyromegaly present.  Cardiovascular: Normal rate, regular rhythm, normal heart sounds and intact distal pulses.   Respiratory: Effort normal and breath sounds normal. No stridor. No respiratory distress. He has no wheezes.  GI: Soft. There is no tenderness. There is no guarding.  Musculoskeletal:       Right hip: He exhibits  decreased range of motion, decreased strength, tenderness and bony tenderness. He exhibits no swelling, no deformity and no laceration.  Lymphadenopathy:    He has no cervical adenopathy.  Neurological: He is alert and oriented to person, place, and time.  Skin: Skin is warm and dry.  Psychiatric: He has a normal mood and affect.    Labs:  Estimated body mass index is 31.63 kg/(m^2) as calculated from the following:   Height as of 11/01/12: 5' 11.5" (1.816 m).   Weight as of 11/20/12: 104.327 kg (230 lb).   Imaging Review Plain radiographs demonstrate severe degenerative joint disease of the right hip(s). The bone quality appears to be good for age and reported activity level.  Assessment/Plan:  End stage arthritis, right hip(s)  The patient history, physical examination, clinical judgement of the provider and imaging studies are consistent with end stage degenerative joint disease of the right hip(s) and total hip arthroplasty is deemed medically necessary. The treatment options including medical management, injection therapy, arthroscopy and arthroplasty were discussed at length. The risks and benefits of total hip arthroplasty were presented and reviewed. The risks due to aseptic loosening, infection, stiffness, dislocation/subluxation,  thromboembolic complications and other imponderables were discussed.  The patient acknowledged the explanation, agreed to proceed with the plan and consent was signed. Patient is being admitted for inpatient treatment for surgery, pain control, PT, OT, prophylactic antibiotics, VTE prophylaxis, progressive ambulation and ADL's and discharge planning.The patient is planning to be discharged home with home health services/SNF .      Anastasio Auerbach Ever Halberg   PAC  01/07/2013, 8:47 PM

## 2013-01-09 ENCOUNTER — Inpatient Hospital Stay (HOSPITAL_COMMUNITY): Payer: Medicare Other

## 2013-01-09 ENCOUNTER — Inpatient Hospital Stay (HOSPITAL_COMMUNITY)
Admission: RE | Admit: 2013-01-09 | Discharge: 2013-01-10 | DRG: 470 | Disposition: A | Discharge status: Home Health | Payer: Medicare Other | Source: Ambulatory Visit | Attending: Orthopedic Surgery | Admitting: Orthopedic Surgery

## 2013-01-09 ENCOUNTER — Inpatient Hospital Stay (HOSPITAL_COMMUNITY): Payer: Medicare Other | Admitting: Anesthesiology

## 2013-01-09 ENCOUNTER — Encounter (HOSPITAL_COMMUNITY)
Admission: RE | Disposition: A | Discharge status: Home Health | Payer: Self-pay | Source: Ambulatory Visit | Attending: Orthopedic Surgery

## 2013-01-09 ENCOUNTER — Encounter (HOSPITAL_COMMUNITY): Payer: Self-pay | Admitting: *Deleted

## 2013-01-09 ENCOUNTER — Encounter (HOSPITAL_COMMUNITY): Payer: Self-pay | Admitting: Anesthesiology

## 2013-01-09 DIAGNOSIS — E785 Hyperlipidemia, unspecified: Secondary | ICD-10-CM | POA: Diagnosis present

## 2013-01-09 DIAGNOSIS — J4489 Other specified chronic obstructive pulmonary disease: Secondary | ICD-10-CM | POA: Diagnosis present

## 2013-01-09 DIAGNOSIS — J449 Chronic obstructive pulmonary disease, unspecified: Secondary | ICD-10-CM | POA: Diagnosis present

## 2013-01-09 DIAGNOSIS — M1610 Unilateral primary osteoarthritis, unspecified hip: Principal | ICD-10-CM | POA: Diagnosis present

## 2013-01-09 DIAGNOSIS — E669 Obesity, unspecified: Secondary | ICD-10-CM | POA: Diagnosis present

## 2013-01-09 DIAGNOSIS — Z96649 Presence of unspecified artificial hip joint: Secondary | ICD-10-CM

## 2013-01-09 DIAGNOSIS — M169 Osteoarthritis of hip, unspecified: Principal | ICD-10-CM | POA: Diagnosis present

## 2013-01-09 DIAGNOSIS — Z6832 Body mass index (BMI) 32.0-32.9, adult: Secondary | ICD-10-CM

## 2013-01-09 DIAGNOSIS — K219 Gastro-esophageal reflux disease without esophagitis: Secondary | ICD-10-CM | POA: Diagnosis present

## 2013-01-09 DIAGNOSIS — Z01812 Encounter for preprocedural laboratory examination: Secondary | ICD-10-CM

## 2013-01-09 DIAGNOSIS — Z87891 Personal history of nicotine dependence: Secondary | ICD-10-CM

## 2013-01-09 DIAGNOSIS — I10 Essential (primary) hypertension: Secondary | ICD-10-CM | POA: Diagnosis present

## 2013-01-09 DIAGNOSIS — Z8673 Personal history of transient ischemic attack (TIA), and cerebral infarction without residual deficits: Secondary | ICD-10-CM

## 2013-01-09 HISTORY — PX: TOTAL HIP ARTHROPLASTY: SHX124

## 2013-01-09 SURGERY — ARTHROPLASTY, HIP, TOTAL, ANTERIOR APPROACH
Anesthesia: General | Site: Hip | Laterality: Right | Wound class: Clean

## 2013-01-09 MED ORDER — EPHEDRINE SULFATE 50 MG/ML IJ SOLN
INTRAMUSCULAR | Status: DC | PRN
  Administered 2013-01-09: 5 mg via INTRAVENOUS
  Administered 2013-01-09 (×2): 10 mg via INTRAVENOUS
  Administered 2013-01-09: 5 mg via INTRAVENOUS

## 2013-01-09 MED ORDER — PANTOPRAZOLE SODIUM 40 MG PO TBEC
40.0000 mg | DELAYED_RELEASE_TABLET | Freq: Every day | ORAL | Status: DC
  Administered 2013-01-10: 40 mg via ORAL
  Filled 2013-01-09 (×2): qty 1

## 2013-01-09 MED ORDER — METHOCARBAMOL 500 MG PO TABS
500.0000 mg | ORAL_TABLET | Freq: Four times a day (QID) | ORAL | Status: DC | PRN
  Filled 2013-01-09 (×2): qty 1

## 2013-01-09 MED ORDER — PHENOL 1.4 % MT LIQD: 1.0000 | OROMUCOSAL | Status: DC | PRN

## 2013-01-09 MED ORDER — LACTATED RINGERS IV SOLN: INTRAVENOUS | Status: DC

## 2013-01-09 MED ORDER — CEFAZOLIN SODIUM-DEXTROSE 2-3 GM-% IV SOLR
2.0000 g | INTRAVENOUS | Status: AC
  Administered 2013-01-09: 2 g via INTRAVENOUS

## 2013-01-09 MED ORDER — ALUM & MAG HYDROXIDE-SIMETH 200-200-20 MG/5ML PO SUSP: 30.0000 mL | ORAL | Status: DC | PRN

## 2013-01-09 MED ORDER — MENTHOL 3 MG MT LOZG: 1.0000 | LOZENGE | OROMUCOSAL | Status: DC | PRN

## 2013-01-09 MED ORDER — SODIUM CHLORIDE 0.9 % IV SOLN
100.0000 mL/h | INTRAVENOUS | Status: DC
  Administered 2013-01-10: 100 mL/h via INTRAVENOUS
  Administered 2013-01-10: 20 mL/h via INTRAVENOUS
  Filled 2013-01-09 (×9): qty 1000

## 2013-01-09 MED ORDER — METHOCARBAMOL 100 MG/ML IJ SOLN
500.0000 mg | Freq: Four times a day (QID) | INTRAMUSCULAR | Status: DC | PRN
  Administered 2013-01-09: 500 mg via INTRAVENOUS
  Filled 2013-01-09: qty 5

## 2013-01-09 MED ORDER — ROCURONIUM BROMIDE 100 MG/10ML IV SOLN
INTRAVENOUS | Status: DC | PRN
  Administered 2013-01-09: 10 mg via INTRAVENOUS
  Administered 2013-01-09: 50 mg via INTRAVENOUS

## 2013-01-09 MED ORDER — FENTANYL CITRATE 0.05 MG/ML IJ SOLN
INTRAMUSCULAR | Status: DC | PRN
  Administered 2013-01-09: 50 ug via INTRAVENOUS
  Administered 2013-01-09: 25 ug via INTRAVENOUS
  Administered 2013-01-09 (×2): 100 ug via INTRAVENOUS
  Administered 2013-01-09: 50 ug via INTRAVENOUS
  Administered 2013-01-09: 25 ug via INTRAVENOUS
  Administered 2013-01-09: 50 ug via INTRAVENOUS

## 2013-01-09 MED ORDER — HYDROMORPHONE HCL PF 1 MG/ML IJ SOLN
INTRAMUSCULAR | Status: DC | PRN
  Administered 2013-01-09: 1 mg via INTRAVENOUS
  Administered 2013-01-09: 2 mg via INTRAVENOUS
  Administered 2013-01-09: 1 mg via INTRAVENOUS

## 2013-01-09 MED ORDER — METOCLOPRAMIDE HCL 5 MG/ML IJ SOLN
INTRAMUSCULAR | Status: DC | PRN
  Administered 2013-01-09: 10 mg via INTRAVENOUS

## 2013-01-09 MED ORDER — HYDROMORPHONE HCL PF 1 MG/ML IJ SOLN
0.5000 mg | INTRAMUSCULAR | Status: DC | PRN
Start: 2013-01-09 — End: 2013-01-10
  Administered 2013-01-09: 1 mg via INTRAVENOUS
  Filled 2013-01-09: qty 1

## 2013-01-09 MED ORDER — ZOLPIDEM TARTRATE 5 MG PO TABS: 5.0000 mg | ORAL_TABLET | Freq: Every evening | ORAL | Status: DC | PRN

## 2013-01-09 MED ORDER — AMLODIPINE BESYLATE 5 MG PO TABS
5.0000 mg | ORAL_TABLET | Freq: Every day | ORAL | Status: DC
  Administered 2013-01-09 – 2013-01-10 (×2): 5 mg via ORAL
  Filled 2013-01-09 (×2): qty 1

## 2013-01-09 MED ORDER — DIPHENHYDRAMINE HCL 25 MG PO CAPS: 25.0000 mg | ORAL_CAPSULE | Freq: Four times a day (QID) | ORAL | Status: DC | PRN

## 2013-01-09 MED ORDER — ONDANSETRON HCL 4 MG/2ML IJ SOLN: 4.0000 mg | Freq: Four times a day (QID) | INTRAMUSCULAR | Status: DC | PRN

## 2013-01-09 MED ORDER — METOCLOPRAMIDE HCL 10 MG PO TABS: 5.0000 mg | ORAL_TABLET | Freq: Three times a day (TID) | ORAL | Status: DC | PRN

## 2013-01-09 MED ORDER — PROMETHAZINE HCL 25 MG/ML IJ SOLN: 6.2500 mg | INTRAMUSCULAR | Status: DC | PRN

## 2013-01-09 MED ORDER — KETAMINE HCL 10 MG/ML IJ SOLN
INTRAMUSCULAR | Status: DC | PRN
  Administered 2013-01-09: 25 mg via INTRAVENOUS

## 2013-01-09 MED ORDER — NEOSTIGMINE METHYLSULFATE 1 MG/ML IJ SOLN
INTRAMUSCULAR | Status: DC | PRN
  Administered 2013-01-09: 5 mg via INTRAVENOUS

## 2013-01-09 MED ORDER — POLYETHYLENE GLYCOL 3350 17 G PO PACK
17.0000 g | PACK | Freq: Two times a day (BID) | ORAL | Status: DC
  Administered 2013-01-09 – 2013-01-10 (×2): 17 g via ORAL

## 2013-01-09 MED ORDER — BISACODYL 10 MG RE SUPP: 10.0000 mg | Freq: Every day | RECTAL | Status: DC | PRN

## 2013-01-09 MED ORDER — ONDANSETRON HCL 4 MG/2ML IJ SOLN
INTRAMUSCULAR | Status: DC | PRN
  Administered 2013-01-09: 4 mg via INTRAVENOUS

## 2013-01-09 MED ORDER — DEXAMETHASONE SODIUM PHOSPHATE 10 MG/ML IJ SOLN
10.0000 mg | Freq: Once | INTRAMUSCULAR | Status: DC
  Filled 2013-01-09: qty 1

## 2013-01-09 MED ORDER — DOCUSATE SODIUM 100 MG PO CAPS
100.0000 mg | ORAL_CAPSULE | Freq: Two times a day (BID) | ORAL | Status: DC
  Administered 2013-01-09 – 2013-01-10 (×2): 100 mg via ORAL

## 2013-01-09 MED ORDER — CHLORHEXIDINE GLUCONATE 4 % EX LIQD
60.0000 mL | Freq: Once | CUTANEOUS | Status: DC
  Filled 2013-01-09: qty 60

## 2013-01-09 MED ORDER — 0.9 % SODIUM CHLORIDE (POUR BTL) OPTIME
TOPICAL | Status: DC | PRN
  Administered 2013-01-09: 1000 mL

## 2013-01-09 MED ORDER — ASPIRIN EC 325 MG PO TBEC
325.0000 mg | DELAYED_RELEASE_TABLET | Freq: Two times a day (BID) | ORAL | Status: DC
  Administered 2013-01-10: 325 mg via ORAL
  Filled 2013-01-09 (×3): qty 1

## 2013-01-09 MED ORDER — HYDROCODONE-ACETAMINOPHEN 7.5-325 MG PO TABS
1.0000 | ORAL_TABLET | ORAL | Status: DC
  Filled 2013-01-09: qty 1

## 2013-01-09 MED ORDER — CELECOXIB 200 MG PO CAPS
200.0000 mg | ORAL_CAPSULE | Freq: Two times a day (BID) | ORAL | Status: DC
  Administered 2013-01-09 – 2013-01-10 (×2): 200 mg via ORAL
  Filled 2013-01-09 (×3): qty 1

## 2013-01-09 MED ORDER — TRANEXAMIC ACID 100 MG/ML IV SOLN
1000.0000 mg | Freq: Once | INTRAVENOUS | Status: AC
  Administered 2013-01-09: 1000 mg via INTRAVENOUS
  Filled 2013-01-09: qty 10

## 2013-01-09 MED ORDER — ONDANSETRON HCL 4 MG PO TABS: 4.0000 mg | ORAL_TABLET | Freq: Four times a day (QID) | ORAL | Status: DC | PRN

## 2013-01-09 MED ORDER — FLEET ENEMA 7-19 GM/118ML RE ENEM: 1.0000 | ENEMA | Freq: Once | RECTAL | Status: AC | PRN

## 2013-01-09 MED ORDER — CEFAZOLIN SODIUM-DEXTROSE 2-3 GM-% IV SOLR
2.0000 g | Freq: Four times a day (QID) | INTRAVENOUS | Status: AC
  Administered 2013-01-09 – 2013-01-10 (×2): 2 g via INTRAVENOUS
  Filled 2013-01-09 (×2): qty 50

## 2013-01-09 MED ORDER — HYDROCODONE-ACETAMINOPHEN 7.5-325 MG PO TABS
1.0000 | ORAL_TABLET | ORAL | Status: DC
  Administered 2013-01-10: 2 via ORAL
  Administered 2013-01-10: 1 via ORAL
  Administered 2013-01-10: 2 via ORAL
  Administered 2013-01-10: 1 via ORAL
  Filled 2013-01-09: qty 2
  Filled 2013-01-09: qty 1
  Filled 2013-01-09: qty 2
  Filled 2013-01-09: qty 1

## 2013-01-09 MED ORDER — FERROUS SULFATE 325 (65 FE) MG PO TABS
325.0000 mg | ORAL_TABLET | Freq: Three times a day (TID) | ORAL | Status: DC
  Administered 2013-01-10 (×2): 325 mg via ORAL
  Filled 2013-01-09 (×4): qty 1

## 2013-01-09 MED ORDER — DEXAMETHASONE SODIUM PHOSPHATE 4 MG/ML IJ SOLN
INTRAMUSCULAR | Status: DC | PRN
  Administered 2013-01-09: 10 mg via INTRAVENOUS

## 2013-01-09 MED ORDER — HYDROMORPHONE HCL PF 1 MG/ML IJ SOLN
0.2500 mg | INTRAMUSCULAR | Status: DC | PRN
Start: 2013-01-09 — End: 2013-01-09

## 2013-01-09 MED ORDER — PROPOFOL 10 MG/ML IV BOLUS
INTRAVENOUS | Status: DC | PRN
  Administered 2013-01-09: 110 mg via INTRAVENOUS

## 2013-01-09 MED ORDER — GLYCOPYRROLATE 0.2 MG/ML IJ SOLN
INTRAMUSCULAR | Status: DC | PRN
  Administered 2013-01-09: 0.6 mg via INTRAVENOUS

## 2013-01-09 MED ORDER — LACTATED RINGERS IV SOLN
INTRAVENOUS | Status: DC
  Administered 2013-01-09: 1000 mL via INTRAVENOUS
  Administered 2013-01-09 (×2): via INTRAVENOUS

## 2013-01-09 MED ORDER — DEXAMETHASONE SODIUM PHOSPHATE 10 MG/ML IJ SOLN: 10.0000 mg | Freq: Once | INTRAMUSCULAR | Status: DC

## 2013-01-09 MED ORDER — METOCLOPRAMIDE HCL 5 MG/ML IJ SOLN
5.0000 mg | Freq: Three times a day (TID) | INTRAMUSCULAR | Status: DC | PRN
  Administered 2013-01-09: 10 mg via INTRAVENOUS
  Filled 2013-01-09: qty 2

## 2013-01-09 SURGICAL SUPPLY — 38 items
BAG ZIPLOCK 12X15 (MISCELLANEOUS) ×2 IMPLANT
BLADE SAW SGTL 18X1.27X75 (BLADE) ×2 IMPLANT
CAPT HIP PF MOP ×2 IMPLANT
CLOTH BEACON ORANGE TIMEOUT ST (SAFETY) ×2 IMPLANT
DERMABOND ADVANCED (GAUZE/BANDAGES/DRESSINGS) ×1
DERMABOND ADVANCED .7 DNX12 (GAUZE/BANDAGES/DRESSINGS) ×1 IMPLANT
DRAPE C-ARM 42X120 X-RAY (DRAPES) ×2 IMPLANT
DRAPE STERI IOBAN 125X83 (DRAPES) ×2 IMPLANT
DRAPE U-SHAPE 47X51 STRL (DRAPES) ×2 IMPLANT
DRSG AQUACEL AG ADV 3.5X10 (GAUZE/BANDAGES/DRESSINGS) ×2 IMPLANT
DRSG TEGADERM 4X4.75 (GAUZE/BANDAGES/DRESSINGS) ×2 IMPLANT
DURAPREP 26ML APPLICATOR (WOUND CARE) ×2 IMPLANT
ELECT BLADE TIP CTD 4 INCH (ELECTRODE) ×2 IMPLANT
ELECT REM PT RETURN 9FT ADLT (ELECTROSURGICAL) ×2
ELECTRODE REM PT RTRN 9FT ADLT (ELECTROSURGICAL) ×1 IMPLANT
EVACUATOR 1/8 PVC DRAIN (DRAIN) ×2 IMPLANT
FACESHIELD LNG OPTICON STERILE (SAFETY) ×6 IMPLANT
GAUZE SPONGE 2X2 8PLY STRL LF (GAUZE/BANDAGES/DRESSINGS) ×1 IMPLANT
GLOVE BIOGEL PI IND STRL 7.5 (GLOVE) ×1 IMPLANT
GLOVE BIOGEL PI IND STRL 8 (GLOVE) ×1 IMPLANT
GLOVE BIOGEL PI INDICATOR 7.5 (GLOVE) ×1
GLOVE BIOGEL PI INDICATOR 8 (GLOVE) ×1
GLOVE ECLIPSE 8.0 STRL XLNG CF (GLOVE) ×2 IMPLANT
GLOVE ORTHO TXT STRL SZ7.5 (GLOVE) ×4 IMPLANT
GOWN BRE IMP PREV XXLGXLNG (GOWN DISPOSABLE) ×2 IMPLANT
GOWN STRL NON-REIN LRG LVL3 (GOWN DISPOSABLE) ×2 IMPLANT
KIT BASIN OR (CUSTOM PROCEDURE TRAY) ×2 IMPLANT
PACK TOTAL JOINT (CUSTOM PROCEDURE TRAY) ×2 IMPLANT
PADDING CAST COTTON 6X4 STRL (CAST SUPPLIES) ×2 IMPLANT
SPONGE GAUZE 2X2 STER 10/PKG (GAUZE/BANDAGES/DRESSINGS) ×1
SUCTION FRAZIER 12FR DISP (SUCTIONS) ×2 IMPLANT
SUT MNCRL AB 4-0 PS2 18 (SUTURE) ×2 IMPLANT
SUT VIC AB 1 CT1 36 (SUTURE) ×6 IMPLANT
SUT VIC AB 2-0 CT1 27 (SUTURE) ×2
SUT VIC AB 2-0 CT1 TAPERPNT 27 (SUTURE) ×2 IMPLANT
SUT VLOC 180 0 24IN GS25 (SUTURE) ×2 IMPLANT
TOWEL OR 17X26 10 PK STRL BLUE (TOWEL DISPOSABLE) ×2 IMPLANT
TRAY FOLEY BAG SILVER LF 16FR (CATHETERS) ×2 IMPLANT

## 2013-01-09 NOTE — Progress Notes (Signed)
Utilization review completed.  

## 2013-01-09 NOTE — Op Note (Signed)
NAME:  Brendan Hopkins                ACCOUNT NO.: [id number]      MEDICAL RECORD NO.: [id number]      FACILITY:  [facility]      PHYSICIAN:  Durene Romans D  DATE OF BIRTH:  [birth year]     DATE OF PROCEDURE:  01/09/2013                                 OPERATIVE REPORT         PREOPERATIVE DIAGNOSIS: Right  hip osteoarthritis.      POSTOPERATIVE DIAGNOSIS:  Right hip osteoarthritis.      PROCEDURE:  Right total hip replacement through an anterior approach   utilizing DePuy THR system, component size 54mm pinnacle cup, a size 36+4 neutral   Altrex liner, a size 8 Hi Tri Lock stem with a 36+1.5 Articuleze metal ball.      SURGEON:  Madlyn Frankel. Charlann Boxer, M.D.      ASSISTANT:  Lanney Gins, PA-C     ANESTHESIA:  General.      SPECIMENS:  None.      COMPLICATIONS:  None.      BLOOD LOSS:  150 cc     DRAINS:  One Hemovac.      INDICATION OF THE PROCEDURE:  Brendan Hopkins is a 77 y.o. male who had   presented to office for evaluation of right hip pain.  Radiographs revealed   progressive degenerative changes with bone-on-bone   articulation to the  hip joint.  The patient had painful limited range of   motion significantly affecting their overall quality of life.  The patient was failing to    respond to conservative measures, and at this point was ready   to proceed with more definitive measures.  The patient has noted progressive   degenerative changes in his hip, progressive problems and dysfunction   with regarding the hip prior to surgery.  Consent was obtained for   benefit of pain relief.  Specific risk of infection, DVT, component   failure, dislocation, need for revision surgery, as well discussion of   the anterior versus posterior approach were reviewed.  Consent was   obtained for benefit of anterior pain relief through an anterior   approach.      PROCEDURE IN DETAIL:  The patient was brought to operative theater.   Once adequate  anesthesia, preoperative antibiotics, 2gm Ancef administered.   The patient was positioned supine on the OSI Hanna table.  Once adequate   padding of boney process was carried out, we had predraped out the hip, and  used fluoroscopy to confirm orientation of the pelvis and position.      The right hip was then prepped and draped from proximal iliac crest to   mid thigh with shower curtain technique.      Time-out was performed identifying the patient, planned procedure, and   extremity.     An incision was then made 2 cm distal and lateral to the   anterior superior iliac spine extending over the orientation of the   tensor fascia lata muscle and sharp dissection was carried down to the   fascia of the muscle and protractor placed in the soft tissues.      The fascia was then incised.  The muscle belly was identified and swept  laterally and retractor placed along the superior neck.  Following   cauterization of the circumflex vessels and removing some pericapsular   fat, a second cobra retractor was placed on the inferior neck.  A third   retractor was placed on the anterior acetabulum after elevating the   anterior rectus.  A L-capsulotomy was along the line of the   superior neck to the trochanteric fossa, then extended proximally and   distally.  Tag sutures were placed and the retractors were then placed   intracapsular.  We then identified the trochanteric fossa and   orientation of my neck cut, confirmed this radiographically   and then made a neck osteotomy with the femur on traction.  The femoral   head was removed without difficulty or complication.  Traction was let   off and retractors were placed posterior and anterior around the   acetabulum.      The labrum and foveal tissue were debrided.  I began reaming with a 47mm   reamer and reamed up to 53mm reamer with good bony bed preparation and a 54   cup was chosen.  The final 54mm Pinnacle cup was then impacted under  fluoroscopy  to confirm the depth of penetration and orientation with respect to   abduction.  A screw was placed followed by the hole eliminator.  The final   36+4 neitral Altrex liner was impacted with good visualized rim fit.  The cup was positioned anatomically within the acetabular portion of the pelvis.      At this point, the femur was rolled at 80 degrees.  Further capsule was   released off the inferior aspect of the femoral neck.  I then   released the superior capsule proximally.  The hook was placed laterally   along the femur and elevated manually and held in position with the bed   hook.  The leg was then extended and adducted with the leg rolled to 100   degrees of external rotation.  Once the proximal femur was fully   exposed, I used a box osteotome to set orientation.  I then began   broaching with the starting chili pepper broach and passed this by hand and then broached up to 8.  With the 8 broach in place I chose a high offset neck and did a trial reduction with the 36+1.5 trial ball.  The offset was appropriate, confirmed radiographically.  He is noted to have advanced left hip arthritis as well so I intentional left him a bit longer on this right hip correcting for his arthritic loss of cartilage height with the known possibility that his left hip would need to be replaced at some time.  Given these findings, I went ahead and dislocated the hip, repositioned all   retractors and positioned the right hip in the extended and abducted position.  The final 8 Hi Tri Lock stem was   chosen and it was impacted down to the level of neck cut.  Based on this   and the trial reduction, a 36+1.5 Articuleze metal ball was chosen and   impacted onto a clean and dry trunnion, and the hip was reduced.  The   hip had been irrigated throughout the case again at this point.  I did   reapproximate the superior capsular leaflet to the anterior leaflet   using #1 Vicryl, placed a medium Hemovac  drain deep.  The fascia of the   tensor fascia lata muscle was then  reapproximated using #1 Vicryl.  The   remaining wound was closed with 2-0 Vicryl and running 4-0 Monocryl.   The hip was cleaned, dried, and dressed sterilely using Dermabond and   Aquacel dressing.  Drain site dressed separately.  She was then brought   to recovery room in stable condition tolerating the procedure well.    Lanney Gins, PA-C was present for the entirety of the case involved from   preoperative positioning, perioperative retractor management, general   facilitation of the case, as well as primary wound closure as assistant.            Madlyn Frankel Charlann Boxer, M.D.            MDO/MEDQ  D:  03/16/2011  T:  03/16/2011  Job:  [id number]      Electronically Signed by Durene Romans M.D. on 03/22/2011 09:15:38 AM

## 2013-01-09 NOTE — Preoperative (Signed)
Beta Blockers   Reason not to administer Beta Blockers:Not Applicable 

## 2013-01-09 NOTE — Interval H&P Note (Signed)
History and Physical Interval Note:  01/09/2013 10:22 AM  [name]  has presented today for surgery, with the diagnosis of Right Hip Osteoarthritis  The various methods of treatment have been discussed with the patient and family. After consideration of risks, benefits and other options for treatment, the patient has consented to  Procedure(s): RIGHT TOTAL HIP ARTHROPLASTY ANTERIOR APPROACH (Right) as a surgical intervention .  The patient's history has been reviewed, patient examined, no change in status, stable for surgery.  I have reviewed the patient's chart and labs.  Questions were answered to the patient's satisfaction.     Brendan Hopkins

## 2013-01-09 NOTE — Anesthesia Preprocedure Evaluation (Signed)
Anesthesia Evaluation  Patient identified by MRN, date of birth, ID band Patient awake    Reviewed: Allergy & Precautions, H&P , NPO status , Patient's Chart, lab work & pertinent test results  Airway Mallampati: II TM Distance: >3 FB Neck ROM: Full    Dental  (+) Dental Advisory Given, Edentulous Upper and Edentulous Lower   Pulmonary shortness of breath and with exertion, COPDformer smoker,  breath sounds clear to auscultation  Pulmonary exam normal       Cardiovascular hypertension, Pt. on medications + dysrhythmias Atrial Fibrillation Rhythm:Regular Rate:Normal     Neuro/Psych TIACVA negative psych ROS   GI/Hepatic Neg liver ROS, GERD-  Medicated,  Endo/Other  negative endocrine ROS  Renal/GU negative Renal ROS  negative genitourinary   Musculoskeletal negative musculoskeletal ROS (+)   Abdominal   Peds  Hematology negative hematology ROS (+)   Anesthesia Other Findings   Reproductive/Obstetrics                           Anesthesia Physical Anesthesia Plan  ASA: III  Anesthesia Plan: General   Post-op Pain Management:    Induction: Intravenous  Airway Management Planned: Oral ETT  Additional Equipment:   Intra-op Plan:   Post-operative Plan: Extubation in OR  Informed Consent: I have reviewed the patients History and Physical, chart, labs and discussed the procedure including the risks, benefits and alternatives for the proposed anesthesia with the patient or authorized representative who has indicated his/her understanding and acceptance.   Dental advisory given  Plan Discussed with: CRNA  Anesthesia Plan Comments:         Anesthesia Quick Evaluation

## 2013-01-09 NOTE — Transfer of Care (Signed)
Immediate Anesthesia Transfer of Care Note  Patient: Brendan Hopkins  Procedure(s) Performed: Procedure(s): RIGHT TOTAL HIP ARTHROPLASTY ANTERIOR APPROACH (Right)  Patient Location: PACU  Anesthesia Type:General  Level of Consciousness: awake, alert , oriented and patient cooperative  Airway & Oxygen Therapy: Patient Spontanous Breathing and Patient connected to face mask oxygen  Post-op Assessment: Report given to PACU RN, Post -op Vital signs reviewed and stable and Patient moving all extremities  Post vital signs: Reviewed and stable  Complications: No apparent anesthesia complications

## 2013-01-10 ENCOUNTER — Encounter (HOSPITAL_COMMUNITY): Payer: Self-pay | Admitting: Orthopedic Surgery

## 2013-01-10 ENCOUNTER — Other Ambulatory Visit: Payer: Self-pay | Admitting: Family Medicine

## 2013-01-10 DIAGNOSIS — E669 Obesity, unspecified: Secondary | ICD-10-CM | POA: Diagnosis present

## 2013-01-10 LAB — CBC
MCH: 32 pg (ref 26.0–34.0)
MCHC: 34.7 g/dL (ref 30.0–36.0)
MCV: 92.2 fL (ref 78.0–100.0)
Platelets: 187 10*3/uL (ref 150–400)
RBC: 4.12 MIL/uL — ABNORMAL LOW (ref 4.22–5.81)
RDW: 12.7 % (ref 11.5–15.5)

## 2013-01-10 LAB — BASIC METABOLIC PANEL
CO2: 27 mEq/L (ref 19–32)
Calcium: 9.3 mg/dL (ref 8.4–10.5)
Creatinine, Ser: 0.87 mg/dL (ref 0.50–1.35)
GFR calc Af Amer: >90 mL/min (ref >90)
GFR calc non Af Amer: 83 mL/min — ABNORMAL LOW (ref >90)
Sodium: 136 mEq/L (ref 135–145)

## 2013-01-10 MED ORDER — FERROUS SULFATE 325 (65 FE) MG PO TABS: 325.0000 mg | ORAL_TABLET | Freq: Three times a day (TID) | ORAL | Status: DC

## 2013-01-10 MED ORDER — TIZANIDINE HCL 4 MG PO CAPS: 4.0000 mg | ORAL_CAPSULE | Freq: Three times a day (TID) | ORAL | Status: DC | PRN

## 2013-01-10 MED ORDER — ASPIRIN 325 MG PO TBEC: 325.0000 mg | DELAYED_RELEASE_TABLET | Freq: Two times a day (BID) | ORAL | Status: AC

## 2013-01-10 MED ORDER — POLYETHYLENE GLYCOL 3350 17 G PO PACK: 17.0000 g | PACK | Freq: Two times a day (BID) | ORAL | Status: DC

## 2013-01-10 MED ORDER — HYDROCODONE-ACETAMINOPHEN 7.5-325 MG PO TABS: 1.0000 | ORAL_TABLET | ORAL | Status: DC | PRN

## 2013-01-10 MED ORDER — DSS 100 MG PO CAPS: 100.0000 mg | ORAL_CAPSULE | Freq: Two times a day (BID) | ORAL | Status: DC

## 2013-01-10 NOTE — Anesthesia Postprocedure Evaluation (Signed)
Anesthesia Post Note  Patient: Brendan Hopkins  Procedure(s) Performed: Procedure(s) (LRB): RIGHT TOTAL HIP ARTHROPLASTY ANTERIOR APPROACH (Right)  Anesthesia type: General  Patient location: PACU  Post pain: Pain level controlled  Post assessment: Post-op Vital signs reviewed  Last Vitals:  Filed Vitals:   01/10/13 0529  BP: 146/74  Pulse: 66  Temp: 37 C  Resp: 16    Post vital signs: Reviewed  Level of consciousness: sedated  Complications: No apparent anesthesia complications

## 2013-01-10 NOTE — Progress Notes (Signed)
[facility]  [facility] is providing the following services: [employer] (patient declined rw - already has at home)  If patient discharges after hours, please call [phone].   Renard Hamper [phone] 01/10/2013, 8:34 AM

## 2013-01-10 NOTE — Progress Notes (Signed)
Physical Therapy Treatment Patient Details Name: Brendan Hopkins MRN: [id number] DOB: [birth year] Today's Date: 01/10/2013 Time: 6578-4696 PT Time Calculation (min): 28 min  PT Assessment / Plan / Recommendation  History of Present Illness pt was admitted for R anterior direct THA   PT Comments   Reviewed stairs and car transfers with pt and spouse.  Spouse with multiple questions asked and answered  Follow Up Recommendations  Home health PT     Does the patient have the potential to tolerate intense rehabilitation     Barriers to Discharge        Equipment Recommendations  None recommended by PT    Recommendations for Other Services OT consult  Frequency 7X/week   Progress towards PT Goals Progress towards PT goals: Progressing toward goals  Plan Current plan remains appropriate    Precautions / Restrictions Precautions Precautions: Fall Restrictions Weight Bearing Restrictions: No   Pertinent Vitals/Pain 3-4/10 with activity, premed    Mobility  Bed Mobility Bed Mobility: Sit to Supine Supine to Sit: 4: Min guard Sit to Supine: 4: Min guard Details for Bed Mobility Assistance: Pt self assisting R LE with L LE Transfers Transfers: Sit to Stand;Stand to Sit Sit to Stand: 5: Supervision Stand to Sit: 5: Supervision Details for Transfer Assistance: cues for use of UEs to self assist Ambulation/Gait Ambulation/Gait Assistance: 4: Min guard;5: Supervision Ambulation Distance (Feet): 140 Feet Assistive device: Rolling walker Ambulation/Gait Assistance Details: cues for posture, stride length, position from RW Gait Pattern: Step-to pattern;Step-through pattern;Shuffle;Decreased step length - right;Decreased step length - left;Trunk flexed Stairs: Yes Stairs Assistance: 4: Min guard Stair Management Technique: Two rails;Forwards;Step to pattern Number of Stairs: 4    Exercises     PT Diagnosis:    PT Problem List:   PT Treatment Interventions:     PT Goals  (current goals can now be found in the care plan section) Acute Rehab PT Goals 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: With patient Time For Goal Achievement: 01/13/13 Potential to Achieve Goals: Good  Visit Information  Last PT Received On: 01/10/13 Assistance Needed: +1 History of Present Illness: pt was admitted for R anterior direct THA    Subjective Data  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

## 2013-01-10 NOTE — Evaluation (Signed)
Physical Therapy Evaluation Patient Details Name: Brendan Hopkins MRN: [id number] DOB: 06-Jun-1935 Today's Date: 01/10/2013 Time: 0932-1005 PT Time Calculation (min): 33 min  PT Assessment / Plan / Recommendation History of Present Illness  pt was admitted for R anterior direct THA  Clinical Impression  Pt s/p R THR presents with functional mobility limitations 2* decreased R LE strength/ROM and post op pain.  Pt should progress well to d/c home with family assist and HHPT follow up.    PT Assessment  Patient needs continued PT services    Follow Up Recommendations  Home health PT    Does the patient have the potential to tolerate intense rehabilitation      Barriers to Discharge        Equipment Recommendations  None recommended by PT    Recommendations for Other Services OT consult   Frequency 7X/week    Precautions / Restrictions Precautions Precautions: Fall Restrictions Weight Bearing Restrictions: No   Pertinent Vitals/Pain 4/10; premed, ice pack provided      Mobility  Bed Mobility Bed Mobility: Supine to Sit Supine to Sit: 4: Min assist;With rails Details for Bed Mobility Assistance: cues for sequence and use of L LE to self assist Transfers Transfers: Sit to Stand;Stand to Sit Sit to Stand: 4: Min guard Stand to Sit: 5: Supervision Details for Transfer Assistance: cues to extend leg Ambulation/Gait Ambulation/Gait Assistance: 4: Min assist;4: Min guard Ambulation Distance (Feet): 147 Feet Assistive device: Rolling walker Ambulation/Gait Assistance Details: cues for posture, sequence and position from RW Gait Pattern: Step-to pattern;Step-through pattern;Shuffle;Decreased step length - right;Decreased step length - left;Trunk flexed Stairs: No    Exercises Total Joint Exercises Ankle Circles/Pumps: AROM;15 reps;Supine;Both Quad Sets: AROM;10 reps;Both;Supine Heel Slides: AAROM;15 reps;Supine;Right Hip ABduction/ADduction: AAROM;Supine;Right;15  reps   PT Diagnosis: Difficulty walking  PT Problem List: Decreased strength;Decreased range of motion;Decreased activity tolerance;Decreased mobility;Decreased knowledge of use of DME;Pain PT Treatment Interventions: DME instruction;Gait training;Stair training;Functional mobility training;Therapeutic activities;Therapeutic exercise;Patient/family education     PT Goals(Current goals can be found in the care plan section) Acute Rehab PT Goals 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: With patient Time For Goal Achievement: 01/13/13 Potential to Achieve Goals: Good  Visit Information  Last PT Received On: 01/10/13 Assistance Needed: +1 History of Present Illness: pt was admitted for R anterior direct THA       Prior Functioning  Home Living Family/patient expects to be discharged to:: Private residence Living Arrangements: Spouse/significant other Type of Home: House Home Access: Stairs to enter Secretary/administrator of Steps: 2 Entrance Stairs-Rails: Right;Left;Can reach both Home Layout: One level Home Equipment: Environmental consultant - 2 wheels Additional Comments: 3:1 delivered to room Prior Function Level of Independence: Independent with assistive device(s) (adls with difficulty) Comments: Used RW in morning for short period Communication Communication: No difficulties Dominant Hand: Right    Cognition  Cognition Arousal/Alertness: Awake/alert Behavior During Therapy: WFL for tasks assessed/performed Overall Cognitive Status: Within Functional Limits for tasks assessed    Extremity/Trunk Assessment Upper Extremity Assessment Upper Extremity Assessment: Overall WFL for tasks assessed Lower Extremity Assessment Lower Extremity Assessment: RLE deficits/detail RLE Deficits / Details: 2+/5 hip strength with AAROM at hip to 80 flex and 10 abd   Balance    End of Session    GP     Yani Lal 01/10/2013, 12:05 PM

## 2013-01-10 NOTE — Evaluation (Signed)
Occupational Therapy Evaluation Patient Details Name: Brendan Hopkins MRN: [id number] DOB: 04-26-1936 Today's Date: 01/10/2013 Time: 8657-8469 OT Time Calculation (min): 20 min  OT Assessment / Plan / Recommendation History of present illness pt was admitted for R anterior direct THA   Clinical Impression   Pt was admitted for the above procedure.  All education was completed.  Pt does not need any further OT at this time.      OT Assessment  Patient does not need any further OT services    Follow Up Recommendations  No OT follow up    Barriers to Discharge      Equipment Recommendations  3 in 1 bedside comode    Recommendations for Other Services    Frequency       Precautions / Restrictions Restrictions Weight Bearing Restrictions: No   Pertinent Vitals/Pain 5/10 with standing/weight bearing, R hip.  Repositioned with ice    ADL  Grooming: Supervision/safety Where Assessed - Grooming: Supported standing Upper Body Bathing: Set up Where Assessed - Upper Body Bathing: Unsupported sitting Lower Body Bathing: Moderate assistance Where Assessed - Lower Body Bathing: Supported sit to stand Upper Body Dressing: Set up Where Assessed - Upper Body Dressing: Supported standing Lower Body Dressing: Moderate assistance Where Assessed - Lower Body Dressing: Supported sit to stand Toilet Transfer: Hydrographic surveyor Method: Sit to Barista:  (chair, bathroom, chair) Toileting - Architect and Hygiene: Min guard Where Assessed - Engineer, mining and Hygiene: Sit to stand from 3-in-1 or toilet Tub/Shower Transfer: Min guard Tub/Shower Transfer Method: Science writer: Walk in shower Equipment Used: Rolling walker Transfers/Ambulation Related to ADLs: ambulated to bathroom and practiced shower transfer. Pt has a shower seat at home and 3:1 was delivered.  ADL Comments: wife will assist with adls  as needed.  They have an outside reacher--recommended not using this.      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  Visit Information  Last OT Received On: 01/10/13 Assistance Needed: +1 History of Present Illness: pt was admitted for R anterior direct THA       Prior Functioning     Home Living Family/patient expects to be discharged to:: Private residence Living Arrangements: Spouse/significant other Additional Comments: 3:1 delivered to room Prior Function Level of Independence: Independent with assistive device(s) (adls with difficulty) Communication Communication: No difficulties Dominant Hand: Right         Vision/Perception     Cognition  Cognition Arousal/Alertness: Awake/alert Behavior During Therapy: WFL for tasks assessed/performed Overall Cognitive Status: Within Functional Limits for tasks assessed    Extremity/Trunk Assessment Upper Extremity Assessment Upper Extremity Assessment: Overall WFL for tasks assessed     Mobility Transfers Transfers: Sit to Stand;Stand to Sit Sit to Stand: 4: Min guard Stand to Sit: 5: Supervision Details for Transfer Assistance: cues to extend leg     Exercise     Balance     End of Session OT - End of Session Activity Tolerance: Patient tolerated treatment well Patient left: in chair;with call bell/phone within reach;with family/visitor present  GO     Brendan Hopkins 01/10/2013, 10:49 AM Marica Otter, OTR/L [phone] 01/10/2013

## 2013-01-10 NOTE — Progress Notes (Signed)
   Subjective: 1 Day Post-Op Procedure(s) (LRB): RIGHT TOTAL HIP ARTHROPLASTY ANTERIOR APPROACH (Right)   Patient reports pain as mild, pain well controlled. No events throughout the night. Ready to be discharged home if he does well with PT and pain stays well controlled.   Objective:   VITALS:   Filed Vitals:   01/10/13 0529  BP: 146/74  Pulse: 66  Temp: 98.6 F (37 C)  Resp: 16    Neurovascular intact Dorsiflexion/Plantar flexion intact Incision: dressing C/D/I No cellulitis present Compartment soft  LABS  Recent Labs  01/10/13 0425  HGB 13.2  HCT 38.0*  WBC 9.4  PLT 187     Recent Labs  01/10/13 0425  NA 136  K 4.0  BUN 20  CREATININE 0.87  GLUCOSE 154*     Assessment/Plan: 1 Day Post-Op Procedure(s) (LRB): RIGHT TOTAL HIP ARTHROPLASTY ANTERIOR APPROACH (Right) HV drain d/c'ed Foley cath d/c'ed Advance diet Up with therapy D/C IV fluids Discharge home with home health Follow up in 2 weeks at [facility]. Follow up with OLIN,Brytney Somes D in 2 weeks.  Contact information:  [facility] [address], [address] [place] [address] [phone]    Obese (BMI 30-39.9) Estimated body mass index is 32.09 kg/(m^2) as calculated from the following:   Height as of this encounter: 5\' 11"  (1.803 m).   Weight as of this encounter: 104.327 kg (230 lb). Patient also counseled that weight may inhibit the healing process Patient counseled that losing weight will help with future health issues       Anastasio Auerbach. Shellsea Borunda   PAC  01/10/2013, 8:06 AM

## 2013-01-11 NOTE — Progress Notes (Signed)
Utilization review completed.  

## 2013-01-11 NOTE — Care Management Note (Signed)
    Page 1 of 1   01/11/2013     9:22:55 AM   CARE MANAGEMENT NOTE 01/11/2013  Patient:  Brendan Hopkins, Brendan Hopkins   Account Number:  [id number]  Date Initiated:  01/10/2013  Documentation initiated by:  Colleen Can  Subjective/Objective Assessment:   DX RT HIP REPLACEMNT     Action/Plan:   CM SPOKE WITH PT.Plans are for patient to return to his home where spouse will be caregiver. Already has DME.    Genevieve Norlander will provide HHpt with start date 01/11/2013   Anticipated DC Date:  01/10/2013   Anticipated DC Plan:  HOME W HOME HEALTH SERVICES  In-house referral  Clinical Social Worker      DC Planning Services  CM consult      [facility] Choice  HOME HEALTH   Choice offered to / List presented to:  C-1 Patient        HH arranged  HH-2 PT      [facility] agency  [facility]   Status of service:  Completed, signed off Medicare Important Message given?  NA - LOS <3 / Initial given by admissions (If response is "NO", the following Medicare IM given date fields will be blank) Date Medicare IM given:   Date Additional Medicare IM given:    Discharge Disposition:  HOME W HOME HEALTH SERVICES  Per UR Regulation:    If discussed at Long Length of Stay Meetings, dates discussed:    Comments:

## 2013-01-15 NOTE — Discharge Summary (Signed)
Physician Discharge Summary  Patient ID: Brendan Hopkins MRN: [id number] DOB/AGE: 1935/07/14 77 y.o.  Admit date: 01/09/2013 Discharge date: 01/10/2013   Procedures:  Procedure(s) (LRB): RIGHT TOTAL HIP ARTHROPLASTY ANTERIOR APPROACH (Right)  Attending Physician:  Dr. Durene Romans   Admission Diagnoses:   Right hip OA / pain  Discharge Diagnoses:  Principal Problem:   S/P right THA, AA Active Problems:   Obese  Past Medical History  Diagnosis Date  . Hypertension   . TIA (transient ischemic attack)   . Diverticulosis     2005  . Colon polyps     2005  . RBBB     POSS HX AF IN PAST- PAC'S  . COPD (chronic obstructive pulmonary disease)     SMOKED FOR 49 YRS  . Shortness of breath     WITH EXERTION ONLY  . Stroke     FEW YEARS AGO - PT SAYS "HEAT STROKE" X 2 - BUT NO PHYSICAL DEFICITS  . GERD (gastroesophageal reflux disease)   . Pain     RIGHT HIP AND BACK  . Arthritis     RIGHT HIP, HANDS, BACK    HPI: Brendan Hopkins, 77 y.o. male, has a history of pain and functional disability in the right hip(s) due to arthritis and patient has failed non-surgical conservative treatments for greater than 12 weeks to include NSAID's and/or analgesics and activity modification. Onset of symptoms was gradual starting 1 years ago with rapidlly worsening for a couple of months.The patient noted no past surgery on the right hip(s). Patient currently rates pain in the right hip at 10 out of 10 with activity. Patient has night pain, worsening of pain with activity and weight bearing, trendelenberg gait, pain that interfers with activities of daily living and pain with passive range of motion. Patient has evidence of periarticular osteophytes and joint space narrowing by imaging studies. This condition presents safety issues increasing the risk of falls. There is no current active infection. Risks, benefits and expectations were discussed with the patient. Patient understand the risks,  benefits and expectations and wishes to proceed with surgery.   PCP: Leo Grosser, MD   Discharged Condition: good  Hospital Course:  Patient underwent the above stated procedure on 01/09/2013. Patient tolerated the procedure well and brought to the recovery room in good condition and subsequently to the floor.  POD #1 BP: 146/74 ; Pulse: 66 ; Temp: 98.6 F (37 C) ; Resp: 16  Pt's foley was removed, as well as the hemovac drain removed. IV was changed to a saline lock. Patient reports pain as mild, pain well controlled. No events throughout the night. Ready to be discharged home. Neurovascular intact, dorsiflexion/plantar flexion intact, incision: dressing C/D/I, no cellulitis present and compartment soft.   LABS  Basename    HGB  13.2  HCT  38.0    Discharge Exam: General appearance: alert, cooperative and no distress Extremities: Homans sign is negative, no sign of DVT, no edema, redness or tenderness in the calves or thighs and no ulcers, gangrene or trophic changes  Disposition:    Home-Health Care Svc with follow up in 2 weeks   Follow-up Information   Follow up with Shelda Pal, MD. Schedule an appointment as soon as possible for a visit in 2 weeks.   Specialty:  Orthopedic Surgery   Contact information:   [address] [place] [address] [phone]       Discharge Orders   Future Orders Complete  By Expires   Call MD / Call 911  As directed    Comments:     If you experience chest pain or shortness of breath, CALL 911 and be transported to the hospital emergency room.  If you develope a fever above 101 F, pus (white drainage) or increased drainage or redness at the wound, or calf pain, call your surgeon's office.   Change dressing  As directed    Comments:     Maintain surgical dressing for 10-14 days, then replace with 4x4 guaze and tape. Keep the area dry and clean.   Constipation Prevention  As directed    Comments:     Drink  plenty of fluids.  Prune juice may be helpful.  You may use a stool softener, such as Colace (over the counter) 100 mg twice a day.  Use MiraLax (over the counter) for constipation as needed.   Diet - low sodium heart healthy  As directed    Discharge instructions  As directed    Comments:     Maintain surgical dressing for 10-14 days, then replace with gauze and tape. Keep the area dry and clean until follow up. Follow [facility] in [facility] at [facility]. Call with any questions or concerns.   Increase activity slowly as tolerated  As directed    TED hose  As directed    Comments:     Use stockings (TED hose) for 2 weeks on both leg(s).  You may remove them at night for sleeping.   Weight bearing as tolerated  As directed         Medication List    STOP taking these medications       ibuprofen 200 MG tablet  Commonly known as:  ADVIL,MOTRIN      TAKE these medications       amLODipine 5 MG tablet  Commonly known as:  NORVASC  Take 5 mg by mouth every evening.     aspirin 325 MG EC tablet  Take 1 tablet (325 mg total) by mouth 2 (two) times daily.     DSS 100 MG Caps  Take 100 mg by mouth 2 (two) times daily.     ferrous sulfate 325 (65 FE) MG tablet  Take 1 tablet (325 mg total) by mouth 3 (three) times daily after meals.     fish oil-omega-3 fatty acids 1000 MG capsule  Take 1 g by mouth daily.     HYDROcodone-acetaminophen 7.5-325 MG per tablet  Commonly known as:  NORCO  Take 1-2 tablets by mouth every 4 (four) hours as needed for pain.     multivitamin with minerals Tabs tablet  Take 1 tablet by mouth daily.     pantoprazole 40 MG tablet  Commonly known as:  PROTONIX  Take 40 mg by mouth daily. TAKES IN EVENING     polyethylene glycol packet  Commonly known as:  MIRALAX / GLYCOLAX  Take 17 g by mouth 2 (two) times daily.     tiZANidine 4 MG capsule  Commonly known as:  ZANAFLEX  Take 1 capsule (4 mg total) by mouth 3 (three) times daily as needed for  muscle spasms.         Signed: Anastasio Auerbach. Indy Kuck   PAC  01/15/2013, 6:41 PM

## 2013-04-09 ENCOUNTER — Ambulatory Visit (INDEPENDENT_AMBULATORY_CARE_PROVIDER_SITE_OTHER): Payer: Medicare Other | Admitting: Family Medicine

## 2013-04-09 DIAGNOSIS — Z23 Encounter for immunization: Secondary | ICD-10-CM

## 2013-07-10 ENCOUNTER — Encounter: Payer: Self-pay | Admitting: Internal Medicine

## 2013-07-12 ENCOUNTER — Encounter: Payer: Self-pay | Admitting: Internal Medicine

## 2013-07-30 ENCOUNTER — Other Ambulatory Visit: Payer: Self-pay | Admitting: Family Medicine

## 2013-07-31 NOTE — Telephone Encounter (Signed)
Last OV 09/22/12  OK refill?  Not on medication list?

## 2013-08-03 ENCOUNTER — Other Ambulatory Visit: Payer: Self-pay | Admitting: Family Medicine

## 2013-09-30 ENCOUNTER — Other Ambulatory Visit: Payer: Self-pay | Admitting: Family Medicine

## 2013-10-07 ENCOUNTER — Other Ambulatory Visit: Payer: Self-pay | Admitting: Family Medicine

## 2013-10-19 ENCOUNTER — Encounter: Payer: Self-pay | Admitting: Internal Medicine

## 2013-12-10 ENCOUNTER — Other Ambulatory Visit: Payer: Medicare HMO

## 2013-12-10 DIAGNOSIS — I10 Essential (primary) hypertension: Secondary | ICD-10-CM

## 2013-12-10 DIAGNOSIS — Z0000 Encounter for general adult medical examination without abnormal findings: Secondary | ICD-10-CM

## 2013-12-10 DIAGNOSIS — E785 Hyperlipidemia, unspecified: Secondary | ICD-10-CM

## 2013-12-10 DIAGNOSIS — R0600 Dyspnea, unspecified: Secondary | ICD-10-CM

## 2013-12-10 LAB — LIPID PANEL
Cholesterol: 161 mg/dL (ref 0–200)
HDL: 48 mg/dL (ref >39)
LDL CALC: 101 mg/dL — AB (ref 0–99)
Total CHOL/HDL Ratio: 3.4 Ratio
Triglycerides: 61 mg/dL (ref <150)
VLDL: 12 mg/dL (ref 0–40)

## 2013-12-10 LAB — CBC WITH DIFFERENTIAL/PLATELET
BASOS ABS: 0.1 10*3/uL (ref 0.0–0.1)
Basophils Relative: 1 % (ref 0–1)
EOS ABS: 0.2 10*3/uL (ref 0.0–0.7)
Eosinophils Relative: 4 % (ref 0–5)
HCT: 42.5 % (ref 39.0–52.0)
Hemoglobin: 14.6 g/dL (ref 13.0–17.0)
LYMPHS ABS: 1.7 10*3/uL (ref 0.7–4.0)
LYMPHS PCT: 29 % (ref 12–46)
MCH: 31.6 pg (ref 26.0–34.0)
MCHC: 34.4 g/dL (ref 30.0–36.0)
MCV: 92 fL (ref 78.0–100.0)
Monocytes Absolute: 0.3 10*3/uL (ref 0.1–1.0)
Monocytes Relative: 6 % (ref 3–12)
NEUTROS PCT: 60 % (ref 43–77)
Neutro Abs: 3.4 10*3/uL (ref 1.7–7.7)
PLATELETS: 194 10*3/uL (ref 150–400)
RBC: 4.62 MIL/uL (ref 4.22–5.81)
RDW: 13.9 % (ref 11.5–15.5)
WBC: 5.7 10*3/uL (ref 4.0–10.5)

## 2013-12-10 LAB — COMPLETE METABOLIC PANEL WITH GFR
ALT: 16 U/L (ref 0–53)
AST: 19 U/L (ref 0–37)
Albumin: 4.2 g/dL (ref 3.5–5.2)
Alkaline Phosphatase: 80 U/L (ref 39–117)
BILIRUBIN TOTAL: 1 mg/dL (ref 0.2–1.2)
BUN: 24 mg/dL — ABNORMAL HIGH (ref 6–23)
CHLORIDE: 103 meq/L (ref 96–112)
CO2: 24 meq/L (ref 19–32)
Calcium: 9.3 mg/dL (ref 8.4–10.5)
Creat: 0.99 mg/dL (ref 0.50–1.35)
GFR, EST AFRICAN AMERICAN: 86 mL/min
GFR, Est Non African American: 74 mL/min
Glucose, Bld: 106 mg/dL — ABNORMAL HIGH (ref 70–99)
Potassium: 4.6 mEq/L (ref 3.5–5.3)
SODIUM: 138 meq/L (ref 135–145)
TOTAL PROTEIN: 6.9 g/dL (ref 6.0–8.3)

## 2013-12-10 LAB — TSH: TSH: 3.63 u[IU]/mL (ref 0.350–4.500)

## 2013-12-11 LAB — PSA, MEDICARE: PSA: 0.89 ng/mL (ref <=4.00)

## 2013-12-13 ENCOUNTER — Ambulatory Visit (AMBULATORY_SURGERY_CENTER): Payer: Managed Care, Other (non HMO) | Admitting: *Deleted

## 2013-12-13 VITALS — Ht 69.5 in | Wt 226.2 lb

## 2013-12-13 DIAGNOSIS — Z86010 Personal history of colonic polyps: Secondary | ICD-10-CM

## 2013-12-13 MED ORDER — NA SULFATE-K SULFATE-MG SULF 17.5-3.13-1.6 GM/177ML PO SOLN: ORAL | Status: DC

## 2013-12-13 NOTE — Progress Notes (Signed)
No egg or soy allergy  No anesthesia or intubation problems per pt  No diet medications taken  Registered in EMMI   

## 2013-12-17 ENCOUNTER — Encounter: Payer: Self-pay | Admitting: Family Medicine

## 2013-12-17 ENCOUNTER — Ambulatory Visit (INDEPENDENT_AMBULATORY_CARE_PROVIDER_SITE_OTHER): Payer: Medicare HMO | Admitting: Family Medicine

## 2013-12-17 VITALS — BP 130/84 | HR 78 | Temp 97.7°F | Resp 22 | Ht 70.0 in | Wt 229.0 lb

## 2013-12-17 DIAGNOSIS — Z0000 Encounter for general adult medical examination without abnormal findings: Secondary | ICD-10-CM

## 2013-12-17 NOTE — Progress Notes (Signed)
Subjective:    Patient ID: Brendan Hopkins, male    DOB: 10-23-1935, 78 y.o.   MRN: [id number]  HPI  Patient is here today for complete physical exam. Overall he is doing extremely well. He denies any complaints. His last colonoscopy was performed in 2012. He is scheduled to see his gastroenterologist later this year. Patient has had Prevnar 13. He has had Pneumovax 23. His last tetanus shot was in 2005. However he declines a repeat tetanus shot today. His most recent labwork as listed below: Lab on 12/10/2013  Component Date Value Ref Range Status  . Cholesterol 12/10/2013 161  0 - 200 mg/dL Final   Comment: ATP III Classification:                                < 200        mg/dL        Desirable                               200 - 239     mg/dL        Borderline High                               >= 240        mg/dL        High                             . Triglycerides 12/10/2013 61  <150 mg/dL Final  . HDL 12/10/2013 48  >39 mg/dL Final  . Total CHOL/HDL Ratio 12/10/2013 3.4   Final  . VLDL 12/10/2013 12  0 - 40 mg/dL Final  . LDL Cholesterol 12/10/2013 101* 0 - 99 mg/dL Final   Comment:                            Total Cholesterol/HDL Ratio:CHD Risk                                                 Coronary Heart Disease Risk Table                                                                 Men       Women                                   1/2 Average Risk              3.4        3.3                                       Average Risk  5.0        4.4                                    2X Average Risk              9.6        7.1                                    3X Average Risk             23.4       11.0                          Use the calculated Patient Ratio above and the CHD Risk table                           to determine the patient's CHD Risk.                          ATP III Classification (LDL):                                < 100        mg/dL          Optimal                               100 - 129     mg/dL         Near or Above Optimal                               130 - 159     mg/dL         Borderline High                               160 - 189     mg/dL         High                                > 190        mg/dL         Very High                             . PSA 12/10/2013 0.89  <=4.00 ng/mL Final   Comment: Test Methodology: ECLIA PSA (Electrochemiluminescence Immunoassay)                                                     For PSA values from 2.5-4.0, particularly in younger men <60 years  old, the AUA and NCCN suggest testing for % Free PSA (3515) and                          evaluation of the rate of increase in PSA (PSA velocity).  . TSH 12/10/2013 3.630  0.350 - 4.500 uIU/mL Final  . WBC 12/10/2013 5.7  4.0 - 10.5 K/uL Final  . RBC 12/10/2013 4.62  4.22 - 5.81 MIL/uL Final  . Hemoglobin 12/10/2013 14.6  13.0 - 17.0 g/dL Final  . HCT 12/10/2013 42.5  39.0 - 52.0 % Final  . MCV 12/10/2013 92.0  78.0 - 100.0 fL Final  . MCH 12/10/2013 31.6  26.0 - 34.0 pg Final  . MCHC 12/10/2013 34.4  30.0 - 36.0 g/dL Final  . RDW 12/10/2013 13.9  11.5 - 15.5 % Final  . Platelets 12/10/2013 194  150 - 400 K/uL Final  . Neutrophils Relative % 12/10/2013 60  43 - 77 % Final  . Neutro Abs 12/10/2013 3.4  1.7 - 7.7 K/uL Final  . Lymphocytes Relative 12/10/2013 29  12 - 46 % Final  . Lymphs Abs 12/10/2013 1.7  0.7 - 4.0 K/uL Final  . Monocytes Relative 12/10/2013 6  3 - 12 % Final  . Monocytes Absolute 12/10/2013 0.3  0.1 - 1.0 K/uL Final  . Eosinophils Relative 12/10/2013 4  0 - 5 % Final  . Eosinophils Absolute 12/10/2013 0.2  0.0 - 0.7 K/uL Final  . Basophils Relative 12/10/2013 1  0 - 1 % Final  . Basophils Absolute 12/10/2013 0.1  0.0 - 0.1 K/uL Final  . Smear Review 12/10/2013 Criteria for review not met   Final  . Sodium 12/10/2013 138  135 - 145 mEq/L Final  . Potassium 12/10/2013 4.6  3.5 - 5.3 mEq/L  Final  . Chloride 12/10/2013 103  96 - 112 mEq/L Final  . CO2 12/10/2013 24  19 - 32 mEq/L Final  . Glucose, Bld 12/10/2013 106* 70 - 99 mg/dL Final  . BUN 12/10/2013 24* 6 - 23 mg/dL Final  . Creat 12/10/2013 0.99  0.50 - 1.35 mg/dL Final  . Total Bilirubin 12/10/2013 1.0  0.2 - 1.2 mg/dL Final  . Alkaline Phosphatase 12/10/2013 80  39 - 117 U/L Final  . AST 12/10/2013 19  0 - 37 U/L Final  . ALT 12/10/2013 16  0 - 53 U/L Final  . Total Protein 12/10/2013 6.9  6.0 - 8.3 g/dL Final  . Albumin 12/10/2013 4.2  3.5 - 5.2 g/dL Final  . Calcium 12/10/2013 9.3  8.4 - 10.5 mg/dL Final  . GFR, Est African American 12/10/2013 86   Final  . GFR, Est Non African American 12/10/2013 74   Final   Comment:                            The estimated GFR is a calculation valid for adults (>=38 years old)                          that uses the CKD-EPI algorithm to adjust for age and sex. It is                            not to be used for children, pregnant women, hospitalized patients,  patients on dialysis, or with rapidly changing kidney function.                          According to the NKDEP, eGFR >89 is normal, 60-89 shows mild                          impairment, 30-59 shows moderate impairment, 15-29 shows severe                          impairment and <15 is ESRD.                              Past Medical History  Diagnosis Date  . Hypertension   . TIA (transient ischemic attack)   . Diverticulosis     2005  . Colon polyps     2005  . RBBB     POSS HX AF IN PAST- PAC'S  . COPD (chronic obstructive pulmonary disease)     SMOKED FOR 62 YRS  . Shortness of breath     WITH EXERTION ONLY  . Stroke     FEW YEARS AGO - PT SAYS "HEAT STROKE" X 2 - BUT NO PHYSICAL DEFICITS  . GERD (gastroesophageal reflux disease)   . Pain     RIGHT HIP AND BACK  . Arthritis     RIGHT HIP, HANDS, BACK  . Personal history of colonic polyps-adenoma and sessile serrated polyp  07/15/2010  . Cataract    Past Surgical History  Procedure Laterality Date  . No previous surgery    . Total hip arthroplasty Right 01/09/2013    Procedure: RIGHT TOTAL HIP ARTHROPLASTY ANTERIOR APPROACH;  Surgeon: [name], MD;  Location: [facility];  Service: Orthopedics;  Laterality: Right;   Current Outpatient Prescriptions on File Prior to Visit  Medication Sig Dispense Refill  . amLODipine (NORVASC) 5 MG tablet Take 5 mg by mouth every evening.      Marland Kitchen aspirin EC 81 MG tablet Take 81 mg by mouth daily.      . fish oil-omega-3 fatty acids 1000 MG capsule Take 1 g by mouth daily.      Marland Kitchen ibuprofen (ADVIL,MOTRIN) 200 MG tablet Take 200 mg by mouth 4 (four) times daily.      . meclizine (ANTIVERT) 25 MG tablet Take one tablet by mouth three times daily as needed  30 tablet  0  . Multiple Vitamin (MULTIVITAMIN WITH MINERALS) TABS Take 1 tablet by mouth daily.      . Na Sulfate-K Sulfate-Mg Sulf (SUPREP BOWEL PREP) SOLN Suprep as directed, no substitutions  354 mL  0  . pantoprazole (PROTONIX) 40 MG tablet Take one tablet by mouth one time daily  30 tablet  11   No current facility-administered medications on file prior to visit.   No Known Allergies History   Social History  . Marital Status: Married    Spouse Name: N/A    Number of Children: N/A  . Years of Education: N/A   Occupational History  . Not on file.   Social History Main Topics  . Smoking status: Former Smoker -- 78 years    Types: Cigarettes    Quit date: 05/25/2007  . Smokeless tobacco: Never Used  . Alcohol Use: No  . Drug Use: No  . Sexual Activity: Not on file  Other Topics Concern  . Not on file   Social History Narrative  . No narrative on file   Family History  Problem Relation Age of Onset  . Cancer Father   . Cancer Mother     died more of old age at age 59  . Colon cancer Neg Hx   . Esophageal cancer Neg Hx   . Prostate cancer Neg Hx   . Rectal cancer Neg Hx      Review of Systems    All other systems reviewed and are negative.      Objective:   Physical Exam  Vitals reviewed. Constitutional: He is oriented to person, place, and time. He appears well-developed and well-nourished. No distress.  HENT:  Head: Normocephalic and atraumatic.  Right Ear: External ear normal.  Left Ear: External ear normal.  Nose: Nose normal.  Mouth/Throat: Oropharynx is clear and moist. No oropharyngeal exudate.  Eyes: Conjunctivae and EOM are normal. Pupils are equal, round, and reactive to light. Right eye exhibits no discharge. Left eye exhibits no discharge. No scleral icterus.  Neck: Normal range of motion. Neck supple. No JVD present. No tracheal deviation present. No thyromegaly present.  Cardiovascular: Normal rate, regular rhythm, normal heart sounds and intact distal pulses.  Exam reveals no gallop and no friction rub.   No murmur heard. Pulmonary/Chest: Effort normal and breath sounds normal. No stridor. No respiratory distress. He has no wheezes. He has no rales. He exhibits no tenderness.  Abdominal: Soft. Bowel sounds are normal. He exhibits no distension and no mass. There is no tenderness. There is no rebound and no guarding.  Musculoskeletal: Normal range of motion. He exhibits no edema and no tenderness.  Lymphadenopathy:    He has no cervical adenopathy.  Neurological: He is alert and oriented to person, place, and time. He has normal reflexes. He displays normal reflexes. No cranial nerve deficit. He exhibits normal muscle tone. Coordination normal.  Skin: Skin is warm. No rash noted. He is not diaphoretic. No erythema. No pallor.  Psychiatric: He has a normal mood and affect. His behavior is normal. Judgment and thought content normal.   patient declines digital rectal exam        Assessment & Plan:  1. Routine general medical examination at a health care facility Patient's physical exam is completely normal. His preventive care is up to date. His PSA is  excellent. His colonoscopy is up to date. His lab work is all within normal limits. His goal LDL cholesterol is less than 100. Result cholesterols except. I recommended cholesterol below 100. His blood pressure is excellent. On May no changes in his medication at this time. Regular anticipatory guidance was provided.

## 2013-12-27 ENCOUNTER — Encounter: Payer: Self-pay | Admitting: Internal Medicine

## 2013-12-27 ENCOUNTER — Ambulatory Visit (AMBULATORY_SURGERY_CENTER): Payer: Managed Care, Other (non HMO) | Admitting: Internal Medicine

## 2013-12-27 VITALS — BP 126/76 | HR 55 | Temp 97.4°F | Resp 22 | Ht 70.0 in | Wt 229.0 lb

## 2013-12-27 DIAGNOSIS — Z86010 Personal history of colonic polyps: Secondary | ICD-10-CM

## 2013-12-27 MED ORDER — SODIUM CHLORIDE 0.9 % IV SOLN: 500.0000 mL | INTRAVENOUS | Status: DC

## 2013-12-27 NOTE — Patient Instructions (Signed)
YOU HAD AN ENDOSCOPIC PROCEDURE TODAY AT THE [facility]: Refer to the procedure report that was given to you for any specific questions about what was found during the examination.  If the procedure report does not answer your questions, please call your gastroenterologist to clarify.  If you requested that your care partner not be given the details of your procedure findings, then the procedure report has been included in a sealed envelope for you to review at your convenience later.  YOU SHOULD EXPECT: Some feelings of bloating in the abdomen. Passage of more gas than usual.  Walking can help get rid of the air that was put into your GI tract during the procedure and reduce the bloating. If you had a lower endoscopy (such as a colonoscopy or flexible sigmoidoscopy) you may notice spotting of blood in your stool or on the toilet paper. If you underwent a bowel prep for your procedure, then you may not have a normal bowel movement for a few days.  DIET: Your first meal following the procedure should be a light meal and then it is ok to progress to your normal diet.  A half-sandwich or bowl of soup is an example of a good first meal.  Heavy or fried foods are harder to digest and may make you feel nauseous or bloated.  Likewise meals heavy in dairy and vegetables can cause extra gas to form and this can also increase the bloating.  Drink plenty of fluids but you should avoid alcoholic beverages for 24 hours.  ACTIVITY: Your care partner should take you home directly after the procedure.  You should plan to take it easy, moving slowly for the rest of the day.  You can resume normal activity the day after the procedure however you should NOT DRIVE or use heavy machinery for 24 hours (because of the sedation medicines used during the test).    SYMPTOMS TO REPORT IMMEDIATELY: A gastroenterologist can be reached at any hour.  During normal business hours, 8:30 AM to 5:00 PM Monday through Friday,  call [phone].  After hours and on weekends, please call the GI answering service at [phone] who will take a message and have the physician on call contact you.   Following lower endoscopy (colonoscopy or flexible sigmoidoscopy):  Excessive amounts of blood in the stool  Significant tenderness or worsening of abdominal pains  Swelling of the abdomen that is new, acute  Fever of 100F or higher    FOLLOW UP: If any biopsies were taken you will be contacted by phone or by letter within the next 1-3 weeks.  Call your gastroenterologist if you have not heard about the biopsies in 3 weeks.  Our staff will call the home number listed on your records the next business day following your procedure to check on you and address any questions or concerns that you may have at that time regarding the information given to you following your procedure. This is a courtesy call and so if there is no answer at the home number and we have not heard from you through the emergency physician on call, we will assume that you have returned to your regular daily activities without incident.  SIGNATURES/CONFIDENTIALITY: You and/or your care partner have signed paperwork which will be entered into your electronic medical record.  These signatures attest to the fact that that the information above on your After Visit Summary has been reviewed and is understood.  Full responsibility of the confidentiality   of this discharge information lies with you and/or your care-partner.   Diverticulosis information given.  Repeat colonoscopy in 78 years, office visit discussion prior to due to age-78 years old.

## 2013-12-27 NOTE — Op Note (Signed)
[place]  [address]. [place], [address]   COLONOSCOPY PROCEDURE REPORT  PATIENT: Brendan Hopkins, Brendan Hopkins  MR#: [id number] BIRTHDATE: [birth year] , 77  yrs. old GENDER: Male ENDOSCOPIST: Gatha Mayer, Brendan Hopkins, [facility] PROCEDURE DATE:  12/27/2013 PROCEDURE:   Colonoscopy, surveillance First Screening Colonoscopy - Avg.  risk and is 50 yrs.  old or older - No.  Prior Negative Screening - Now for repeat screening. N/A  History of Adenoma - Now for follow-up colonoscopy & has been > or = to 3 yrs.  Yes hx of adenoma.  Has been 3 or more years since last colonoscopy.  Polyps Removed Today? No.  Recommend repeat exam, <10 yrs? Yes.  High risk (family or personal hx). ASA CLASS:   Class III INDICATIONS:Patient's personal history of adenomatous colon polyps.  MEDICATIONS: propofol (Diprivan) 300mg  IV, MAC sedation, administered by CRNA, and These medications were titrated to patient response per physician's verbal order  DESCRIPTION OF PROCEDURE:   After the risks benefits and alternatives of the procedure were thoroughly explained, informed consent was obtained.  A digital rectal exam revealed no abnormalities of the rectum, A digital rectal exam revealed no prostatic nodules, and A digital rectal exam revealed the prostate was not enlarged.   The [id number]  endoscope was introduced through the anus and advanced to the cecum, which was identified by both the appendix and ileocecal valve. No adverse events experienced.   The quality of the prep was excellent using Suprep  The instrument was then slowly withdrawn as the colon was fully examined.      COLON FINDINGS: Severe diverticulosis was noted in the sigmoid colon.   The colon mucosa was otherwise normal.   A right colon retroflexion was performed.   A right colon retroflexion was performed.  Retroflexed views revealed no abnormalities. The time to cecum=1 minutes 49 seconds.  Withdrawal time=8 minutes  43 seconds.  The scope was withdrawn and the procedure completed. COMPLICATIONS: There were no complications.  ENDOSCOPIC IMPRESSION: 1.   Severe diverticulosis was noted in the sigmoid colon 2.   The colon mucosa was otherwise normal - excellent prep - hx adenoma and ssp 2012  RECOMMENDATIONS: Repeat Colonoscopy in 5 years office visit discussion prior as he will be 82   eSigned:  Gatha Mayer, Brendan Hopkins, [facility] 12/27/2013 8:24 AM   cc: The Patient

## 2013-12-27 NOTE — Progress Notes (Signed)
Stable to RR 

## 2013-12-28 ENCOUNTER — Telehealth: Payer: Self-pay | Admitting: *Deleted

## 2013-12-28 NOTE — Telephone Encounter (Signed)
  Follow up Call-  Call back number 12/27/2013  Post procedure Call Back phone  # [phone]  Permission to leave phone message Yes     Patient questions:  Do you have a fever, pain , or abdominal swelling? No. Pain Score  0 *  Have you tolerated food without any problems? Yes.    Have you been able to return to your normal activities? Yes.    Do you have any questions about your discharge instructions: Diet   No. Medications  No. Follow up visit  No.  Do you have questions or concerns about your Care? No.  Actions: * If pain score is 4 or above: No action needed, pain <4.

## 2014-01-08 ENCOUNTER — Encounter: Payer: Self-pay | Admitting: Internal Medicine

## 2014-02-12 ENCOUNTER — Encounter: Payer: Self-pay | Admitting: Family Medicine

## 2014-02-12 ENCOUNTER — Telehealth: Payer: Self-pay | Admitting: Family Medicine

## 2014-02-12 ENCOUNTER — Ambulatory Visit (INDEPENDENT_AMBULATORY_CARE_PROVIDER_SITE_OTHER): Payer: Medicare HMO | Admitting: Family Medicine

## 2014-02-12 VITALS — BP 142/78 | HR 60 | Temp 97.9°F | Resp 18 | Wt 223.0 lb

## 2014-02-12 DIAGNOSIS — R2231 Localized swelling, mass and lump, right upper limb: Secondary | ICD-10-CM

## 2014-02-12 DIAGNOSIS — Z23 Encounter for immunization: Secondary | ICD-10-CM

## 2014-02-12 DIAGNOSIS — R229 Localized swelling, mass and lump, unspecified: Secondary | ICD-10-CM

## 2014-02-12 NOTE — Progress Notes (Signed)
Subjective:    Patient ID: Brendan Hopkins, male    DOB: [birth year], 78 y.o.   MRN: [id number]  HPI Patient has an enlarging mass in the web space of his right hand between his thumb and his index finger. It is approximately 1 cm in size. It is white in color. It is freely mobile. It feels like a hard sebaceous cyst or possibly a giant cell tumor. It is constantly interfering with his hand when he is trying to work and he would like surgical excision. Past Medical History  Diagnosis Date  . Hypertension   . TIA (transient ischemic attack)   . Diverticulosis     2005  . Colon polyps     2005  . RBBB     POSS HX AF IN PAST- PAC'S  . COPD (chronic obstructive pulmonary disease)     SMOKED FOR 20 YRS  . Shortness of breath     WITH EXERTION ONLY  . Stroke     FEW YEARS AGO - PT SAYS "HEAT STROKE" X 2 - BUT NO PHYSICAL DEFICITS  . GERD (gastroesophageal reflux disease)   . Pain     RIGHT HIP AND BACK  . Arthritis     RIGHT HIP, HANDS, BACK  . Personal history of colonic polyps-adenoma and sessile serrated polyp 07/15/2010  . Cataract    Past Surgical History  Procedure Laterality Date  . No previous surgery    . Total hip arthroplasty Right 01/09/2013    Procedure: RIGHT TOTAL HIP ARTHROPLASTY ANTERIOR APPROACH;  Surgeon: [name], MD;  Location: [facility];  Service: Orthopedics;  Laterality: Right;   Current Outpatient Prescriptions on File Prior to Visit  Medication Sig Dispense Refill  . amLODipine (NORVASC) 5 MG tablet Take 5 mg by mouth every evening.      Marland Kitchen aspirin EC 81 MG tablet Take 81 mg by mouth daily.      . fish oil-omega-3 fatty acids 1000 MG capsule Take 1 g by mouth daily.      Marland Kitchen ibuprofen (ADVIL,MOTRIN) 200 MG tablet Take 200 mg by mouth 4 (four) times daily.      . meclizine (ANTIVERT) 25 MG tablet Take one tablet by mouth three times daily as needed  30 tablet  0  . Multiple Vitamin (MULTIVITAMIN WITH MINERALS) TABS Take 1 tablet by mouth daily.      .  pantoprazole (PROTONIX) 40 MG tablet Take one tablet by mouth one time daily  30 tablet  11   No current facility-administered medications on file prior to visit.   No Known Allergies History   Social History  . Marital Status: Married    Spouse Name: N/A    Number of Children: N/A  . Years of Education: N/A   Occupational History  . Not on file.   Social History Main Topics  . Smoking status: Former Smoker -- 80 years    Types: Cigarettes    Quit date: 05/25/2007  . Smokeless tobacco: Never Used  . Alcohol Use: No  . Drug Use: No  . Sexual Activity: Not on file   Other Topics Concern  . Not on file   Social History Narrative  . No narrative on file      Review of Systems  All other systems reviewed and are negative.      Objective:   Physical Exam  Vitals reviewed. Cardiovascular: Normal rate and regular rhythm.   Pulmonary/Chest: Effort normal and breath sounds normal.  1 cm nodular-like subcutaneous mass on the right hand between the thumb and index finger in the web space        Assessment & Plan:  Need for prophylactic vaccination and inoculation against influenza - Plan: Flu Vaccine QUAD 36+ mos PF IM (Fluarix Quad PF)  Mass of right hand  This represents a sebaceous cyst versus giant cell tumor. I have recommended surgical excision. Given the location on recommend patient see a hand surgeon to reduce the chance of morbidity after surgical excision such as numbness in his index finger.

## 2014-02-12 NOTE — Telephone Encounter (Signed)
Patient forgot to tell dr pickard this morning that he had a bad cough and would like to now if we can call something in for him  [facility]  [phone] for questions with patient

## 2014-02-14 MED ORDER — BENZONATATE 200 MG PO CAPS: 200.0000 mg | ORAL_CAPSULE | Freq: Three times a day (TID) | ORAL | Status: DC | PRN

## 2014-02-14 NOTE — Telephone Encounter (Signed)
rx to pharmacy and pt made aware 

## 2014-02-14 NOTE — Telephone Encounter (Signed)
Tessalon perles 200 q 8 hrs prn cough (30)

## 2014-03-25 ENCOUNTER — Telehealth: Payer: Self-pay | Admitting: Family Medicine

## 2014-03-25 NOTE — Telephone Encounter (Signed)
Patient is calling to check the status of his referral  [phone]

## 2014-03-28 NOTE — Telephone Encounter (Signed)
Contacted pt back and he is calling in reference to his wife Forestine Chute, states that she is having changes in mental status and wanted to know if there was a referral for her to be seen to have her checked out. I informed pt that she needed appointment her first so that Provider can evaluate her and if feels like need sto be seen by specialist he will refer her. Appointment scheduled for Nov 10 at 12:15p.

## 2014-04-09 ENCOUNTER — Other Ambulatory Visit: Payer: Self-pay | Admitting: Family Medicine

## 2014-04-09 NOTE — Telephone Encounter (Signed)
Refill appropriate and filled per protocol. 

## 2014-04-22 ENCOUNTER — Ambulatory Visit: Payer: Medicare HMO | Admitting: Family Medicine

## 2014-05-23 ENCOUNTER — Ambulatory Visit (INDEPENDENT_AMBULATORY_CARE_PROVIDER_SITE_OTHER): Payer: Medicare HMO | Admitting: Family Medicine

## 2014-05-23 ENCOUNTER — Encounter: Payer: Self-pay | Admitting: Family Medicine

## 2014-05-23 VITALS — BP 140/70 | HR 60 | Temp 98.0°F | Resp 18 | Ht 70.0 in | Wt 228.0 lb

## 2014-05-23 DIAGNOSIS — K5732 Diverticulitis of large intestine without perforation or abscess without bleeding: Secondary | ICD-10-CM

## 2014-05-23 LAB — COMPLETE METABOLIC PANEL WITH GFR
ALK PHOS: 67 U/L (ref 39–117)
ALT: 17 U/L (ref 0–53)
AST: 19 U/L (ref 0–37)
Albumin: 4.3 g/dL (ref 3.5–5.2)
BUN: 22 mg/dL (ref 6–23)
CHLORIDE: 104 meq/L (ref 96–112)
CO2: 23 mEq/L (ref 19–32)
CREATININE: 1.01 mg/dL (ref 0.50–1.35)
Calcium: 9.7 mg/dL (ref 8.4–10.5)
GFR, Est African American: 83 mL/min
GFR, Est Non African American: 72 mL/min
Glucose, Bld: 108 mg/dL — ABNORMAL HIGH (ref 70–99)
Potassium: 4.5 mEq/L (ref 3.5–5.3)
SODIUM: 140 meq/L (ref 135–145)
Total Bilirubin: 1 mg/dL (ref 0.2–1.2)
Total Protein: 7 g/dL (ref 6.0–8.3)

## 2014-05-23 LAB — CBC WITH DIFFERENTIAL/PLATELET
BASOS ABS: 0.1 10*3/uL (ref 0.0–0.1)
Basophils Relative: 1 % (ref 0–1)
Eosinophils Absolute: 0.2 10*3/uL (ref 0.0–0.7)
Eosinophils Relative: 4 % (ref 0–5)
HCT: 42.7 % (ref 39.0–52.0)
Hemoglobin: 15.6 g/dL (ref 13.0–17.0)
LYMPHS PCT: 29 % (ref 12–46)
Lymphs Abs: 1.7 10*3/uL (ref 0.7–4.0)
MCH: 32.4 pg (ref 26.0–34.0)
MCHC: 36.5 g/dL — ABNORMAL HIGH (ref 30.0–36.0)
MCV: 88.8 fL (ref 78.0–100.0)
MONO ABS: 0.6 10*3/uL (ref 0.1–1.0)
MONOS PCT: 10 % (ref 3–12)
MPV: 10.3 fL (ref 8.6–12.4)
Neutro Abs: 3.2 10*3/uL (ref 1.7–7.7)
Neutrophils Relative %: 56 % (ref 43–77)
Platelets: 203 10*3/uL (ref 150–400)
RBC: 4.81 MIL/uL (ref 4.22–5.81)
RDW: 13.1 % (ref 11.5–15.5)
WBC: 5.8 10*3/uL (ref 4.0–10.5)

## 2014-05-23 MED ORDER — CIPROFLOXACIN HCL 500 MG PO TABS: 500.0000 mg | ORAL_TABLET | Freq: Two times a day (BID) | ORAL | Status: DC

## 2014-05-23 MED ORDER — METRONIDAZOLE 500 MG PO TABS: 500.0000 mg | ORAL_TABLET | Freq: Three times a day (TID) | ORAL | Status: DC

## 2014-05-23 NOTE — Progress Notes (Signed)
Subjective:    Patient ID: Brendan Hopkins, male    DOB: 01-02-1936, 78 y.o.   MRN: [id number]  HPI Patient has a history of severe diverticulosis seen on colonoscopy performed in August. Over the last 3 weeks he developed severe left lower quadrant abdominal pain.  He is tender to palpation left lower quadrant. He denies any change in his bowel habits. He denies any diarrhea or blood in his stool. He denies any nausea vomiting. He denies any constipation. The pain does not worsen with movement. It does not appear to be muscular. He denies any hematuria or dysuria. Past Medical History  Diagnosis Date  . Hypertension   . TIA (transient ischemic attack)   . Diverticulosis     2005  . Colon polyps     2005  . RBBB     POSS HX AF IN PAST- PAC'S  . COPD (chronic obstructive pulmonary disease)     SMOKED FOR 22 YRS  . Shortness of breath     WITH EXERTION ONLY  . Stroke     FEW YEARS AGO - PT SAYS "HEAT STROKE" X 2 - BUT NO PHYSICAL DEFICITS  . GERD (gastroesophageal reflux disease)   . Pain     RIGHT HIP AND BACK  . Arthritis     RIGHT HIP, HANDS, BACK  . Personal history of colonic polyps-adenoma and sessile serrated polyp 07/15/2010  . Cataract    Past Surgical History  Procedure Laterality Date  . No previous surgery    . Total hip arthroplasty Right 01/09/2013    Procedure: RIGHT TOTAL HIP ARTHROPLASTY ANTERIOR APPROACH;  Surgeon: [name], MD;  Location: [facility];  Service: Orthopedics;  Laterality: Right;   Current Outpatient Prescriptions on File Prior to Visit  Medication Sig Dispense Refill  . amLODipine (NORVASC) 5 MG tablet TAKE ONE TABLET BY MOUTH ONE TIME DAILY  30 tablet 4  . aspirin EC 81 MG tablet Take 81 mg by mouth daily.    . fish oil-omega-3 fatty acids 1000 MG capsule Take 1 g by mouth daily.    Marland Kitchen ibuprofen (ADVIL,MOTRIN) 200 MG tablet Take 200 mg by mouth 4 (four) times daily.    . Multiple Vitamin (MULTIVITAMIN WITH MINERALS) TABS Take 1 tablet by  mouth daily.    . pantoprazole (PROTONIX) 40 MG tablet Take one tablet by mouth one time daily 30 tablet 11   No current facility-administered medications on file prior to visit.   No Known Allergies History   Social History  . Marital Status: Married    Spouse Name: N/A    Number of Children: N/A  . Years of Education: N/A   Occupational History  . Not on file.   Social History Main Topics  . Smoking status: Former Smoker -- 70 years    Types: Cigarettes    Quit date: 05/25/2007  . Smokeless tobacco: Never Used  . Alcohol Use: No  . Drug Use: No  . Sexual Activity: Not on file   Other Topics Concern  . Not on file   Social History Narrative      Review of Systems  All other systems reviewed and are negative.      Objective:   Physical Exam  Cardiovascular: Normal rate, regular rhythm and normal heart sounds.   No murmur heard. Pulmonary/Chest: Effort normal and breath sounds normal. No respiratory distress. He has no wheezes. He has no rales.  Abdominal: Soft. Bowel sounds are normal. There is  tenderness.  Vitals reviewed.         Assessment & Plan:  Diverticulitis of colon without hemorrhage - Plan: ciprofloxacin (CIPRO) 500 MG tablet, metroNIDAZOLE (FLAGYL) 500 MG tablet, CBC with Differential, COMPLETE METABOLIC PANEL WITH GFR, Sedimentation rate  Patient appears to have diverticulitis although I'm not certain of this diagnosis. I will begin treating him empirically with Flagyl 500 mg by mouth 3 times a day for 10 days and Cipro 500 mg by mouth twice a day for 10 days. I will check a CBC as well as a CMP. I will also schedule the patient for a CT scan of the abdomen given the doubt regarding his diagnosis.

## 2014-05-24 LAB — SEDIMENTATION RATE: SED RATE: 4 mm/h (ref 0–16)

## 2014-05-27 ENCOUNTER — Telehealth: Payer: Self-pay | Admitting: *Deleted

## 2014-05-27 NOTE — Telephone Encounter (Signed)
I called pt this morning to inform him of his CT appt at [facility] imaging and pt wanted to let you know that the 2 RX that was given to him he still hurts but not as bad or often, Pt wants to know do you still want him to have the CT or wait couple more days.

## 2014-05-27 NOTE — Telephone Encounter (Signed)
Yes proceed with ct.

## 2014-05-27 NOTE — Telephone Encounter (Signed)
Called pt and aware to proceed with CT

## 2014-05-31 ENCOUNTER — Ambulatory Visit
Admission: RE | Admit: 2014-05-31 | Discharge: 2014-05-31 | Disposition: A | Discharge status: Home / Self Care | Payer: Commercial Managed Care - HMO | Source: Ambulatory Visit | Attending: Family Medicine | Admitting: Family Medicine

## 2014-05-31 DIAGNOSIS — K5732 Diverticulitis of large intestine without perforation or abscess without bleeding: Secondary | ICD-10-CM

## 2014-05-31 MED ORDER — IOHEXOL 300 MG/ML  SOLN
125.0000 mL | Freq: Once | INTRAMUSCULAR | Status: AC | PRN
  Administered 2014-05-31: 125 mL via INTRAVENOUS

## 2014-06-03 ENCOUNTER — Telehealth: Payer: Self-pay | Admitting: Family Medicine

## 2014-06-03 DIAGNOSIS — R911 Solitary pulmonary nodule: Secondary | ICD-10-CM

## 2014-06-03 NOTE — Telephone Encounter (Signed)
Patient calling for scan results done last week  [phone]

## 2014-06-03 NOTE — Telephone Encounter (Signed)
Awaiting MD to review

## 2014-06-04 NOTE — Telephone Encounter (Signed)
-----   Message from [name], MD sent at 06/04/2014  7:37 AM EST ----- No explanation of the patients pain is seen on CT.  Of note, he does have severe DDD in L-spine and a small nodule in his right lung that were coincidental findings.  Nodule is likely nothing but we need to repeat CT of chest in 6 months to follow up to make sure it is not growing.  Please have sabrina schedule for 6 months.   NTBS if pain is not improving because right now I am not sure of its cause if the abx did not help.

## 2014-06-04 NOTE — Telephone Encounter (Signed)
There is no cause seen on CT.  If not better after abx it is likely not diverticulitis.  I do not know what is causing his pain.  I would like to reexamine him to try to come up with the next step because my first impression was wrong.

## 2014-06-04 NOTE — Telephone Encounter (Signed)
Pt aware of CT results.  He states he has completed the abx.  Still has pain, not quite as bad but still there.  He wants to know what to do now?  I told him you said NTBS, but wants message sent to you anyway!  Future CT chest ordered.

## 2014-06-04 NOTE — Telephone Encounter (Signed)
Left patient message that provider does want to see him again.

## 2014-06-10 ENCOUNTER — Ambulatory Visit (INDEPENDENT_AMBULATORY_CARE_PROVIDER_SITE_OTHER): Payer: Commercial Managed Care - HMO | Admitting: Family Medicine

## 2014-06-10 ENCOUNTER — Encounter: Payer: Self-pay | Admitting: Family Medicine

## 2014-06-10 VITALS — BP 140/84 | HR 68 | Temp 97.6°F | Resp 20 | Wt 231.0 lb

## 2014-06-10 DIAGNOSIS — K5732 Diverticulitis of large intestine without perforation or abscess without bleeding: Secondary | ICD-10-CM

## 2014-06-10 DIAGNOSIS — R1032 Left lower quadrant pain: Secondary | ICD-10-CM

## 2014-06-10 MED ORDER — AMOXICILLIN-POT CLAVULANATE 875-125 MG PO TABS: 1.0000 | ORAL_TABLET | Freq: Two times a day (BID) | ORAL | Status: DC

## 2014-06-10 NOTE — Progress Notes (Signed)
Subjective:    Patient ID: Brendan Hopkins, male    DOB: [birth year], 79 y.o.   MRN: [id number]  HPI 05/23/14 Patient has a history of severe diverticulosis seen on colonoscopy performed in August. Over the last 3 weeks he developed severe left lower quadrant abdominal pain.  He is tender to palpation left lower quadrant. He denies any change in his bowel habits. He denies any diarrhea or blood in his stool. He denies any nausea vomiting. He denies any constipation. The pain does not worsen with movement. It does not appear to be muscular. He denies any hematuria or dysuria.  At that time, my plan was: Patient appears to have diverticulitis although I'm not certain of this diagnosis. I will begin treating him empirically with Flagyl 500 mg by mouth 3 times a day for 10 days and Cipro 500 mg by mouth twice a day for 10 days. I will check a CBC as well as a CMP. I will also schedule the patient for a CT scan of the abdomen given the doubt regarding his diagnosis.  06/10/14 Patient's pain did improve on antibiotics somewhat but it never completely went away. Due to the persistent nature of the pain I did obtain a CT scan. The CT scan revealed no explanation for the patient's left lower quadrant abdominal pain other than the left hemidiaphragm elevation. A second floor quadrant abdominal pain which is made worse by food. He is also having more frequent bowel movements. He has severe diverticulosis. He denies any fevers or chills or myalgias. He denies any melena or hematochezia. He denies any nausea or vomiting. He denies any exacerbation of the pain with movement. It does not appear to be muscular. He denies any hematuria or dysuria.  Past Medical History  Diagnosis Date  . Hypertension   . TIA (transient ischemic attack)   . Diverticulosis     2005  . Colon polyps     2005  . RBBB     POSS HX AF IN PAST- PAC'S  . COPD (chronic obstructive pulmonary disease)     SMOKED FOR 67 YRS  . Shortness  of breath     WITH EXERTION ONLY  . Stroke     FEW YEARS AGO - PT SAYS "HEAT STROKE" X 2 - BUT NO PHYSICAL DEFICITS  . GERD (gastroesophageal reflux disease)   . Pain     RIGHT HIP AND BACK  . Arthritis     RIGHT HIP, HANDS, BACK  . Personal history of colonic polyps-adenoma and sessile serrated polyp 07/15/2010  . Cataract    Past Surgical History  Procedure Laterality Date  . No previous surgery    . Total hip arthroplasty Right 01/09/2013    Procedure: RIGHT TOTAL HIP ARTHROPLASTY ANTERIOR APPROACH;  Surgeon: [name], MD;  Location: [facility];  Service: Orthopedics;  Laterality: Right;   Current Outpatient Prescriptions on File Prior to Visit  Medication Sig Dispense Refill  . amLODipine (NORVASC) 5 MG tablet TAKE ONE TABLET BY MOUTH ONE TIME DAILY  30 tablet 4  . aspirin EC 81 MG tablet Take 81 mg by mouth daily.    . fish oil-omega-3 fatty acids 1000 MG capsule Take 1 g by mouth daily.    Marland Kitchen ibuprofen (ADVIL,MOTRIN) 200 MG tablet Take 200 mg by mouth 4 (four) times daily.    . metroNIDAZOLE (FLAGYL) 500 MG tablet Take 1 tablet (500 mg total) by mouth 3 (three) times daily. 30 tablet 0  .  Multiple Vitamin (MULTIVITAMIN WITH MINERALS) TABS Take 1 tablet by mouth daily.    . pantoprazole (PROTONIX) 40 MG tablet Take one tablet by mouth one time daily 30 tablet 11   No current facility-administered medications on file prior to visit.   No Known Allergies History   Social History  . Marital Status: Married    Spouse Name: N/A    Number of Children: N/A  . Years of Education: N/A   Occupational History  . Not on file.   Social History Main Topics  . Smoking status: Former Smoker -- 53 years    Types: Cigarettes    Quit date: 05/25/2007  . Smokeless tobacco: Never Used  . Alcohol Use: No  . Drug Use: No  . Sexual Activity: Not on file   Other Topics Concern  . Not on file   Social History Narrative      Review of Systems  All other systems reviewed and are  negative.      Objective:   Physical Exam  Cardiovascular: Normal rate, regular rhythm and normal heart sounds.   No murmur heard. Pulmonary/Chest: Effort normal and breath sounds normal. No respiratory distress. He has no wheezes. He has no rales.  Abdominal: Soft. Bowel sounds are normal. There is tenderness.  Vitals reviewed.         Assessment & Plan:  Diverticulitis of colon without hemorrhage - Plan: amoxicillin-clavulanate (AUGMENTIN) 875-125 MG per tablet  LLQ abdominal pain  At this point I am unsure of the patient's cause of his pain. I am concerned there may be a subclinical festering area of diverticulitis given the location and nature of the pain. I will treat the patient with Augmentin 875 mg by mouth twice a day for at least 10 days. If his pain improves we may extend it for 21 days total. The pain is not better after 10 days or like him to see a gastroenterologist for possible colonoscopy.

## 2014-06-12 ENCOUNTER — Encounter: Payer: Self-pay | Admitting: Internal Medicine

## 2014-06-18 ENCOUNTER — Telehealth: Payer: Self-pay | Admitting: Physician Assistant

## 2014-06-18 ENCOUNTER — Ambulatory Visit (INDEPENDENT_AMBULATORY_CARE_PROVIDER_SITE_OTHER): Payer: Commercial Managed Care - HMO | Admitting: Physician Assistant

## 2014-06-18 ENCOUNTER — Encounter: Payer: Self-pay | Admitting: Physician Assistant

## 2014-06-18 VITALS — BP 140/82 | HR 70 | Ht 71.0 in | Wt 226.0 lb

## 2014-06-18 DIAGNOSIS — K5730 Diverticulosis of large intestine without perforation or abscess without bleeding: Secondary | ICD-10-CM

## 2014-06-18 MED ORDER — DICYCLOMINE HCL 10 MG PO CAPS: 10.0000 mg | ORAL_CAPSULE | Freq: Four times a day (QID) | ORAL | Status: DC

## 2014-06-18 MED ORDER — HYOSCYAMINE SULFATE 0.125 MG PO TABS: 0.2500 mg | ORAL_TABLET | Freq: Four times a day (QID) | ORAL | Status: DC | PRN

## 2014-06-18 NOTE — Telephone Encounter (Signed)
Patient notified of new rx

## 2014-06-18 NOTE — Telephone Encounter (Signed)
Dicyclomine 10 mg q 6 hours  # 120, 1 rf

## 2014-06-18 NOTE — Progress Notes (Signed)
Patient ID: Brendan Hopkins, male   DOB: 1936/04/04, 79 y.o.   MRN: [id number]     History of Present Illness:   Brendan Hopkins is a 79 year old male presents here with a 3 to four-week history of left lower quadrant abdominal pain. He describes the pain as an ache in nature. It is exacerbated with ingestion of food and it is alleviated with passage of gas or defecation. He has had no associated diarrhea. He's had no bright red blood per rectum or melena. He has no associated nausea or vomiting. He has not had any fever or chills. He had a colonoscopy on 12/27/2013 that showed severe diverticulosis in the sigmoid colon. He was initially seen by his primary care provider on December 31 with left lower quadrant pain he was treated with a course of Cipro and Flagyl and a CBC was obtained that revealed a normal white blood cell count. He had a CT scan that revealed no diverticulitis. He was seen in follow-up on January 18 and noted some mild improvement on antibiotics but states his pain never went away. He was empirically started on Augmentin and advised to follow up here today. He has no radiation of the pain. He has no associated urinary symptoms. He's had no change in his stool habits or caliber.   Past Medical History  Diagnosis Date  . Hypertension   . TIA (transient ischemic attack)   . Diverticulosis     2005  . Colon polyps     2005  . RBBB     POSS HX AF IN PAST- PAC'S  . COPD (chronic obstructive pulmonary disease)     SMOKED FOR 63 YRS  . Shortness of breath     WITH EXERTION ONLY  . Stroke     FEW YEARS AGO - PT SAYS "HEAT STROKE" X 2 - BUT NO PHYSICAL DEFICITS  . GERD (gastroesophageal reflux disease)   . Pain     RIGHT HIP AND BACK  . Arthritis     RIGHT HIP, HANDS, BACK  . Personal history of colonic polyps-adenoma and sessile serrated polyp 07/15/2010  . Cataract     Past Surgical History  Procedure Laterality Date  . No previous surgery    . Total hip arthroplasty Right  01/09/2013    Procedure: RIGHT TOTAL HIP ARTHROPLASTY ANTERIOR APPROACH;  Surgeon: [name], MD;  Location: [facility];  Service: Orthopedics;  Laterality: Right;   Family History  Problem Relation Age of Onset  . Cancer Father   . Cancer Mother     died more of old age at age 61  . Colon cancer Neg Hx   . Esophageal cancer Neg Hx   . Prostate cancer Neg Hx   . Rectal cancer Neg Hx    History  Substance Use Topics  . Smoking status: Former Smoker -- 55 years    Types: Cigarettes    Quit date: 05/25/2007  . Smokeless tobacco: Never Used  . Alcohol Use: No   Current Outpatient Prescriptions  Medication Sig Dispense Refill  . amLODipine (NORVASC) 5 MG tablet TAKE ONE TABLET BY MOUTH ONE TIME DAILY  30 tablet 4  . amoxicillin-clavulanate (AUGMENTIN) 875-125 MG per tablet Take 1 tablet by mouth 2 (two) times daily. 20 tablet 0  . aspirin EC 81 MG tablet Take 81 mg by mouth daily.    . fish oil-omega-3 fatty acids 1000 MG capsule Take 1 g by mouth daily.    Marland Kitchen ibuprofen (ADVIL,MOTRIN) 200  MG tablet Take 200 mg by mouth 4 (four) times daily.    . Multiple Vitamin (MULTIVITAMIN WITH MINERALS) TABS Take 1 tablet by mouth daily.    . pantoprazole (PROTONIX) 40 MG tablet Take one tablet by mouth one time daily 30 tablet 11  . hyoscyamine (LEVSIN) 0.125 MG tablet Take 2 tablets (0.25 mg total) by mouth every 6 (six) hours as needed for cramping. 120 tablet 1  . metroNIDAZOLE (FLAGYL) 500 MG tablet Take 1 tablet (500 mg total) by mouth 3 (three) times daily. (Patient not taking: Reported on 06/18/2014) 30 tablet 0   No current facility-administered medications for this visit.   No Known Allergies    Review of Systems: Gen: Denies any fever, chills, sweats, anorexia, fatigue, weakness, malaise, weight loss, and sleep disorder CV: Denies chest pain, angina, palpitations, syncope, orthopnea, PND, peripheral edema, and claudication. Resp: Denies dyspnea at rest, dyspnea with exercise, cough,  sputum, wheezing, coughing up blood, and pleurisy. GI: Denies vomiting blood, jaundice, and fecal incontinence.   Denies dysphagia or odynophagia. GU : Denies urinary burning, blood in urine, urinary frequency, urinary hesitancy, nocturnal urination, and urinary incontinence. MS: Denies joint pain, limitation of movement, and swelling, stiffness, low back pain, extremity pain. Denies muscle weakness, cramps, atrophy.  Derm: Denies rash, itching, dry skin, hives, moles, warts, or unhealing ulcers.  Psych: Denies depression, anxiety, memory loss, suicidal ideation, hallucinations, paranoia, and confusion. Heme: Denies bruising, bleeding, and enlarged lymph nodes. Neuro:  Denies any headaches, dizziness, paresthesia Endo:  Denies any problems with DM, thyroid, adrenal  LAB RESULTS: CBC on 05/23/2014 had a white blood cell count 5.8, hemoglobin 15.6, hematocrit 42.7, platelets 203,000.  Studies:   Ct Abdomen Pelvis W Contrast  05/31/2014   CLINICAL DATA:  Left lower quadrant pain for 4 weeks, nausea, evaluate for diverticulitis, on antibiotics  EXAM: CT ABDOMEN AND PELVIS WITH CONTRAST  TECHNIQUE: Multidetector CT imaging of the abdomen and pelvis was performed using the standard protocol following bolus administration of intravenous contrast.  CONTRAST:  162mL OMNIPAQUE IOHEXOL 300 MG/ML  SOLN  COMPARISON:  None.  FINDINGS: There is levoscoliosis of lumbar spine. Sagittal images shows significant disc space flattening with endplate sclerotic changes multilevel. There is significant disc space flattening with vacuum disc phenomenon and mild anterior spurring at T11-T12 level. Significant disc space flattening with mild anterior and mild posterior spurring at T12-L1-L1-L2 L2-L3 and L3-L4 level. Moderate disc space flattening with vacuum disc phenomenon and posterior spurring at L4-L5 level. Disc space flattening with vacuum disc phenomenon at L5-S1 level. There are metallic artifacts from right hip  prosthesis.  There is elevation of the left hemidiaphragm. Mild atelectasis noted left base posteriorly.  Mild hepatic fatty infiltration. No calcified gallstones are noted within gallbladder. No focal hepatic mass. The pancreas, spleen and adrenal glands are unremarkable.  Kidneys are symmetrical in size and enhancement. No hydronephrosis or hydroureter. There is a nonobstructive calculus in lower left renal pelvis measures 8.8 mm. Mild cortical thinning noted lower pole of the left kidney. Nonobstructive calcified calculus in midpole of the left kidney measures 6.7 mm. Nonobstructive calculus in medial aspect lower pole of the left kidney measures 6 mm.  No calcified ureteral calculi are noted.  Oral contrast material was given to the patient. No small bowel obstruction. No ascites or free air. No adenopathy. Descending colon diverticula are noted. Multiple sigmoid colon diverticula. There is no definite evidence of acute diverticulitis.  No pericecal inflammation. Normal appendix partially visualized in axial image  58. The urinary bladder is unremarkable. Prostate gland and seminal vesicles are unremarkable.  Coronal images shows moderate degenerative changes left hip joint.  Delayed renal images shows bilateral renal symmetrical excretion. There is bilateral complication of proximal ureter.  Axial image one there is 6 mm nodule in right lower lobe  IMPRESSION: 1. There is levoscoliosis in extensive degenerative changes lumbar spine are noted. Degenerative changes left hip joint. Right hip prosthesis in anatomic alignment. 2. No small bowel or colonic obstruction. 3. Mild elevation of the left hemidiaphragm with left lung base posterior atelectasis. 4. Left colon diverticula are noted. Multiple sigmoid colon diverticula. No evidence of acute diverticulitis or colitis. 5. No hydronephrosis or hydroureter. Left nonobstructive nephrolithiasis. No calcified ureteral calculi. Bilateral duplication of proximal ureter.  6. No pericecal inflammation.  Normal appendix partially visualized. 7. There is 6 mm non calcified nodule in right lower lobe partially visualized. If the patient is at high risk for bronchogenic carcinoma, follow-up chest CT at 6-12 months is recommended. If the patient is at low risk for bronchogenic carcinoma, follow-up chest CT at 12 months is recommended. This recommendation follows the consensus statement: Guidelines for Management of Small Pulmonary Nodules Detected on CT Scans: A Statement from the [employer] as published in Radiology 2005;237:395-400.   Electronically Signed   By: Lahoma Crocker M.D.   On: 05/31/2014 10:58   Endoscopies: Colonoscopy 12/27/13:  COLON FINDINGS: Severe diverticulosis was noted in the sigmoid colon. The colon mucosa was otherwise normal. A right colon retroflexion was performed. A right colon retroflexion was performed. Retroflexed views revealed no abnormalities. The time to cecum=1 minutes 49 seconds. Withdrawal time=8 minutes 43 seconds. The scope was withdrawn and the procedure completed. COMPLICATIONS: There were no complications. ENDOSCOPIC IMPRESSION: 1. Severe diverticulosis was noted in the sigmoid colon 2. The colon mucosa was otherwise normal - excellent prep - hx adenoma and ssp 2012 RECOMMENDATIONS: Repeat Colonoscopy in 5 years office visit discussion prior as he will be 82  Physical Exam: General: Pleasant, well developed male in no acute distress Head: Normocephalic and atraumatic Eyes:  sclerae anicteric, conjunctiva pink  Ears: Normal auditory acuity Lungs: Clear throughout to auscultation Heart: Regular rate and rhythm Abdomen: Soft, non distended, mild LLQ tenderness with no rebound or guarding, no CVAT, No masses, no hepatomegaly. Normal bowel sounds Musculoskeletal: Symmetrical with no gross deformities  Extremities: No edema  Neurological: Alert oriented x 4, grossly nonfocal Psychological:  Alert and cooperative. Normal mood  and affect  Assessment and Recommendations: 79 year old male with known extensive diverticular disease here with a several week history of lower quadrant pain. His pain is reportedly exacerbated with ingestion of food and somewhat alleviated with defecation or passage of gas, suggestive of a functional component. He may have a segment of spastic diverticular disease. Dietary review reveals that the patient's eats out most of the time and has an affinity for fried foods. He has been advised to adhere to a high-fiber low-fat diet. He will be given a trial of Levsin 0.25 mg every 6 hours. He will also try [facility] 250 mg twice a day for 30 days and we'll discontinue his Augmentin he will follow up in 2 weeks, but was advised to call us in 1 week to let us know how he is feeling.    Odesser Tourangeau, Deloris Ping 06/18/2014,

## 2014-06-18 NOTE — Telephone Encounter (Signed)
Lori please advise an alternative to hyoscyamine.  Too expensive.  Thanks

## 2014-06-18 NOTE — Patient Instructions (Addendum)
Stop your augumentin.  We have sent the following medications to your pharmacy for you to pick up at your convenience: levsin  Purchase over the counter Florastor to take twice a day.  This is a probiotic to put the good bacteria back into your colon.  Today you have been given a handout on low fat diet information to read and follow.  We are giving you a benefiber sheet to read and follow.  Take one heaping tablespoon daily.  I will be in touch to make a follow up appointment with Cecille Rubin for you.  I appreciate the opportunity to care for you.

## 2014-06-19 ENCOUNTER — Telehealth: Payer: Self-pay | Admitting: *Deleted

## 2014-06-19 NOTE — Telephone Encounter (Signed)
Submitted [employer] referral thru [facility] for authorization to Dr. Silvano Rusk Gastroenterologist with authorization number [id number]  Dx: [id number] of lg int w/o perforation or abscess w/o bleeding  Number of visits:6  Start Date: 06/18/14  Valid till 12/15/14  Spoke to [place] and is aware of authorization number

## 2014-06-24 NOTE — Progress Notes (Signed)
Agree w/ Ms. Hvozdovic's note and mangement.  

## 2014-07-02 ENCOUNTER — Ambulatory Visit (INDEPENDENT_AMBULATORY_CARE_PROVIDER_SITE_OTHER): Payer: Commercial Managed Care - HMO | Admitting: Physician Assistant

## 2014-07-02 ENCOUNTER — Encounter: Payer: Self-pay | Admitting: *Deleted

## 2014-07-02 VITALS — BP 138/78 | HR 72 | Ht 71.0 in | Wt 229.0 lb

## 2014-07-02 DIAGNOSIS — K589 Irritable bowel syndrome without diarrhea: Secondary | ICD-10-CM

## 2014-07-02 DIAGNOSIS — K5730 Diverticulosis of large intestine without perforation or abscess without bleeding: Secondary | ICD-10-CM

## 2014-07-02 MED ORDER — DICYCLOMINE HCL 10 MG PO CAPS: 20.0000 mg | ORAL_CAPSULE | Freq: Four times a day (QID) | ORAL | Status: DC

## 2014-07-02 NOTE — Patient Instructions (Signed)
We have sent the following medications to your pharmacy for you to pick up at your convenience: Dicyclomine 10 mg capsules-Take 2 capsules every 6 hours as needed  Please purchase the following medications over the counter and take as directed: Gas-X twice daily  You have been scheduled for a follow up with Dr Carlean Purl on Thursday, 08/29/14 @ 11:00 am.

## 2014-07-02 NOTE — Progress Notes (Signed)
Patient ID: Brendan Hopkins, male   DOB: 12-09-1935, 79 y.o.   MRN: [id number]     History of Present Illness:     Brendan Hopkins is a 79 year old male who was last seen here in 05/29/2014. He had presented with a 3 to four-week history of left lower quadrant pain that he described as a constant ache. His pain is exacerbated with ingestion of food and alleviated with passage of gas or defecation. It is not associated with diarrhea. He has had no bright red blood per rectum or melena. He had a colonoscopy in August 2015 that showed severe diverticulosis in the sigmoid. On December 21 he was seen by his primary care provider with left lower quadrant pain and treated with a course of Cipro and Flagyl. CBC was normal and CT revealed no diverticulitis he was seen in follow-up on January 18 and was still complaining of pain and so he wasn't. We started on Augmentin and advised to follow up with [place] his Augmentin was discontinued at his last visit and he was advised to adhere to a high-fiber low-fat diet. He was given a trial of Levsin however his insurance would not cover it and so dicyclomine was substituted. He returns today stating he is unable to tolerate a low-fat diet he says he last approximately 2 days on a low-fat diet and then had to get some fried food because it is what he is used to. His bowel movements are formed and occurring once on a daily basis but he gets left lower quadrant cramping after every meal that is relieved with defecation or passage of gas. He has had no fever or chills. He has not noted any significant relief with the use of dicyclomine 10 mg 4 times daily. His weight has been stable and his appetite is good. He has not experienced dry mouth, sleepiness, or urinary retention with dicyclomine.   Past Medical History  Diagnosis Date  . Hypertension   . TIA (transient ischemic attack)   . Diverticulosis     2005  . Colon polyps     2005  . RBBB     POSS HX AF IN PAST- PAC'S  . COPD  (chronic obstructive pulmonary disease)     SMOKED FOR 46 YRS  . Shortness of breath     WITH EXERTION ONLY  . Stroke     FEW YEARS AGO - PT SAYS "HEAT STROKE" X 2 - BUT NO PHYSICAL DEFICITS  . GERD (gastroesophageal reflux disease)   . Pain     RIGHT HIP AND BACK  . Arthritis     RIGHT HIP, HANDS, BACK  . Personal history of colonic polyps-adenoma and sessile serrated polyp 07/15/2010  . Cataract   . Fatty liver     Past Surgical History  Procedure Laterality Date  . No previous surgery    . Total hip arthroplasty Right 01/09/2013    Procedure: RIGHT TOTAL HIP ARTHROPLASTY ANTERIOR APPROACH;  Surgeon: [name], MD;  Location: [facility];  Service: Orthopedics;  Laterality: Right;   Family History  Problem Relation Age of Onset  . Cancer Father   . Cancer Mother     died more of old age at age 42  . Colon cancer Neg Hx   . Esophageal cancer Neg Hx   . Prostate cancer Neg Hx   . Rectal cancer Neg Hx    History  Substance Use Topics  . Smoking status: Former Smoker -- 98 years  Types: Cigarettes    Quit date: 05/25/2007  . Smokeless tobacco: Never Used  . Alcohol Use: No   Current Outpatient Prescriptions  Medication Sig Dispense Refill  . amLODipine (NORVASC) 5 MG tablet TAKE ONE TABLET BY MOUTH ONE TIME DAILY  30 tablet 4  . Aspirin (ANACIN PO) Take by mouth. Takes 2 pills 3-4 times daily    . aspirin EC 81 MG tablet Take 81 mg by mouth daily.    Marland Kitchen dicyclomine (BENTYL) 10 MG capsule Take 2 capsules (20 mg total) by mouth every 6 (six) hours. 240 capsule 2  . fish oil-omega-3 fatty acids 1000 MG capsule Take 1 g by mouth daily.    . Multiple Vitamin (MULTIVITAMIN WITH MINERALS) TABS Take 1 tablet by mouth daily.    . pantoprazole (PROTONIX) 40 MG tablet Take one tablet by mouth one time daily 30 tablet 11   No current facility-administered medications for this visit.   No Known Allergies    Review of Systems: Gen: Denies any fever, chills, sweats, anorexia,  fatigue, weakness, malaise, weight loss, and sleep disorder CV: Denies chest pain, angina, palpitations, syncope, orthopnea, PND, peripheral edema, and claudication. Resp: Denies dyspnea at rest, dyspnea with exercise, cough, sputum, wheezing, coughing up blood, and pleurisy. GI: Denies vomiting blood, jaundice, and fecal incontinence.   Denies dysphagia or odynophagia. GU : Denies urinary burning, blood in urine, urinary frequency, urinary hesitancy, nocturnal urination, and urinary incontinence. MS: Denies joint pain, limitation of movement, and swelling, stiffness, low back pain, extremity pain. Denies muscle weakness, cramps, atrophy.  Derm: Denies rash, itching, dry skin, hives, moles, warts, or unhealing ulcers.  Psych: Denies depression, anxiety, memory loss, suicidal ideation, hallucinations, paranoia, and confusion. Heme: Denies bruising, bleeding, and enlarged lymph nodes. Neuro:  Denies any headaches, dizziness, paresthesia Endo:  Denies any problems with DM, thyroid, adrenal    Physical Exam: General: Pleasant, well developed male in no acute distress Head: Normocephalic and atraumatic Eyes:  sclerae anicteric, conjunctiva pink  Ears: Normal auditory acuity Lungs: Clear throughout to auscultation Heart: Regular rate and rhythm Abdomen: Soft, non distended, non-tender. No masses, no hepatomegaly. Normal bowel sounds Musculoskeletal: Symmetrical with no gross deformities  Extremities: No edema  Neurological: Alert oriented x 4, grossly nonfocal Psychological:  Alert and cooperative. Normal mood and affect  Assessment and Recommendations:  79 year old male with a known history of extensive diverticular disease experiencing postprandial cramping. His symptoms are likely functional and representative of some spastic diverticular disease he's been advised to try a low-fat diet again. He will increase his dicyclomine to 10 mg 2 capsules every 6 hours as needed and will return in 4-6  weeks to assess response, sooner if needed.    Dayami Taitt, Vita Barley PA-C 07/02/2014,

## 2014-07-11 NOTE — Progress Notes (Signed)
Agree w/ Ms. Hvozdovic's note and mangement.  

## 2014-08-05 ENCOUNTER — Ambulatory Visit: Payer: Commercial Managed Care - HMO | Admitting: Internal Medicine

## 2014-08-05 ENCOUNTER — Telehealth: Payer: Self-pay | Admitting: Physician Assistant

## 2014-08-05 NOTE — Telephone Encounter (Signed)
Patient asking if it is ok to continue dicyclomine.  He is advised that he can refill as needed. He is asked to keep the follow up with Dr. Carlean Purl on 08/29/14

## 2014-08-29 ENCOUNTER — Ambulatory Visit: Payer: Commercial Managed Care - HMO | Admitting: Internal Medicine

## 2014-09-04 NOTE — Progress Notes (Signed)
Patient ID: Brendan Hopkins, male   DOB: 1935/11/17, 79 y.o.   MRN: [id number] The patient's chart has been reviewed by Dr. Carlean Purl  and the recommendations are noted below.    Follow-up advised. Schedule patient for next available appointment. Outcome of communication with the patient:   Letter mailed

## 2014-09-11 ENCOUNTER — Other Ambulatory Visit: Payer: Self-pay | Admitting: Family Medicine

## 2014-09-27 ENCOUNTER — Encounter: Payer: Self-pay | Admitting: Family Medicine

## 2014-09-27 ENCOUNTER — Ambulatory Visit (INDEPENDENT_AMBULATORY_CARE_PROVIDER_SITE_OTHER): Payer: Commercial Managed Care - HMO | Admitting: Family Medicine

## 2014-09-27 VITALS — BP 106/70 | HR 78 | Temp 98.0°F | Resp 18 | Wt 227.0 lb

## 2014-09-27 DIAGNOSIS — H6123 Impacted cerumen, bilateral: Secondary | ICD-10-CM | POA: Diagnosis not present

## 2014-09-27 NOTE — Progress Notes (Signed)
Subjective:    Patient ID: Brendan Hopkins, male    DOB: 11/20/1935, 79 y.o.   MRN: [id number]  HPI Patient reports decreased hearing in both ears. He believes he has wax plugs bilaterally. He is requesting to have the wax plugs removed so that he can hear better. Physical exam confirms bilateral cerumen impactions. Past Medical History  Diagnosis Date  . Hypertension   . TIA (transient ischemic attack)   . Diverticulosis     2005  . Colon polyps     2005  . RBBB     POSS HX AF IN PAST- PAC'S  . COPD (chronic obstructive pulmonary disease)     SMOKED FOR 90 YRS  . Shortness of breath     WITH EXERTION ONLY  . Stroke     FEW YEARS AGO - PT SAYS "HEAT STROKE" X 2 - BUT NO PHYSICAL DEFICITS  . GERD (gastroesophageal reflux disease)   . Pain     RIGHT HIP AND BACK  . Arthritis     RIGHT HIP, HANDS, BACK  . Personal history of colonic polyps-adenoma and sessile serrated polyp 07/15/2010  . Cataract   . Fatty liver    Past Surgical History  Procedure Laterality Date  . No previous surgery    . Total hip arthroplasty Right 01/09/2013    Procedure: RIGHT TOTAL HIP ARTHROPLASTY ANTERIOR APPROACH;  Surgeon: [name], MD;  Location: [facility];  Service: Orthopedics;  Laterality: Right;  . Colonoscopy     Current Outpatient Prescriptions on File Prior to Visit  Medication Sig Dispense Refill  . amLODipine (NORVASC) 5 MG tablet TAKE ONE TABLET BY MOUTH ONE TIME DAILY 30 tablet 11  . Aspirin (ANACIN PO) Take by mouth. Takes 2 pills 3-4 times daily    . aspirin EC 81 MG tablet Take 81 mg by mouth daily.    Marland Kitchen dicyclomine (BENTYL) 10 MG capsule Take 2 capsules (20 mg total) by mouth every 6 (six) hours. 240 capsule 2  . fish oil-omega-3 fatty acids 1000 MG capsule Take 1 g by mouth daily.    . Multiple Vitamin (MULTIVITAMIN WITH MINERALS) TABS Take 1 tablet by mouth daily.    . pantoprazole (PROTONIX) 40 MG tablet Take one tablet by mouth one time daily 30 tablet 11   No current  facility-administered medications on file prior to visit.   No Known Allergies History   Social History  . Marital Status: Married    Spouse Name: N/A  . Number of Children: N/A  . Years of Education: N/A   Occupational History  . Retired    Social History Main Topics  . Smoking status: Former Smoker -- 1 years    Types: Cigarettes    Quit date: 05/25/2007  . Smokeless tobacco: Never Used  . Alcohol Use: No  . Drug Use: No  . Sexual Activity: Not on file   Other Topics Concern  . Not on file   Social History Narrative     Review of Systems  All other systems reviewed and are negative.      Objective:   Physical Exam  Constitutional: He appears well-developed and well-nourished.  HENT:  Right Ear: External ear normal.  Left Ear: External ear normal.  Neck: Neck supple.  Cardiovascular: Normal rate and regular rhythm.   Pulmonary/Chest: Effort normal and breath sounds normal.  Vitals reviewed.  patient has bilateral cerumen impactions        Assessment & Plan:  Cerumen  impaction, bilateral  Patient does have hearing loss related to the bilateral cerumen impactions. Cerumen impaction was removed using irrigation/lavage without difficulty. The patient's hearing improved dramatically.

## 2014-10-07 ENCOUNTER — Other Ambulatory Visit: Payer: Self-pay | Admitting: Family Medicine

## 2014-10-18 ENCOUNTER — Other Ambulatory Visit: Payer: Self-pay | Admitting: Physician Assistant

## 2014-11-04 ENCOUNTER — Encounter: Payer: Self-pay | Admitting: Internal Medicine

## 2014-11-04 ENCOUNTER — Ambulatory Visit (INDEPENDENT_AMBULATORY_CARE_PROVIDER_SITE_OTHER): Payer: Commercial Managed Care - HMO | Admitting: Internal Medicine

## 2014-11-04 VITALS — BP 128/72 | HR 76 | Ht 71.0 in | Wt 229.0 lb

## 2014-11-04 DIAGNOSIS — R1032 Left lower quadrant pain: Secondary | ICD-10-CM

## 2014-11-04 DIAGNOSIS — R1012 Left upper quadrant pain: Secondary | ICD-10-CM

## 2014-11-04 DIAGNOSIS — K5730 Diverticulosis of large intestine without perforation or abscess without bleeding: Secondary | ICD-10-CM | POA: Diagnosis not present

## 2014-11-04 NOTE — Patient Instructions (Signed)
  You have been scheduled for an endoscopy. Please follow written instructions given to you at your visit today. If you use inhalers (even only as needed), please bring them with you on the day of your procedure.   I appreciate the opportunity to care for you. Carl Gessner, MD, FACG 

## 2014-11-05 ENCOUNTER — Encounter: Payer: Self-pay | Admitting: Internal Medicine

## 2014-11-05 NOTE — Progress Notes (Signed)
   Subjective:    Patient ID: Brendan Hopkins, male    DOB: 06-29-1935, 79 y.o.   MRN: [id number] Chief complaint: Left abdominal pain HPI  The patient is a very nice elderly white man known to me from prior colonoscopy, last in 2015. He had severe diverticulosis on the left side of the colon. He has been having problems with left-sided abdominal pain seems to be in the left lower quadrant area but is during and after eating where it comes on and he feels a pressure there. No problems with defecation. He has been treated 2 or 3 times for diverticulitis with Cipro and metronidazole and then Augmentin since fall 2015. CT scanning in January demonstrated severe diverticulosis but no findings to suggest diverticulitis or other cause of his pain. Dicyclomine was prescribed by one of our physician assistant and does help some. He has not lost weight, he thinks he has gained weight. There is no bleeding. He does not think the antibody helped him though he is not quite sure when questioned closely. However he has the symptoms still. He takes PPI daily but he also uses ibuprofen several times a day because of back pain. X-rays have shown levoscoliosis as well. He does not remember having these problems before the last several months. Medications, allergies, past medical history, past surgical history, family history and social history are reviewed and updated in the EMR.   Review of Systems As per history of present illness. 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    Objective:   Physical Exam @BP  128/72 mmHg  Pulse 76  Ht 5\' 11"  (1.803 m)  Wt 229 lb (103.874 kg)  BMI 31.95 kg/m2@  General:  NAD Eyes:   anicteric Lungs:  clear Heart::  S1S2 no rubs, murmurs or gallops Abdomen:  soft and mild to moderately tender in the mid and left lower quadrants. His ribs calmed down low there is not much abdomen there., BS+     Data Reviewed:  Prior primary care notes from 2016 in  late 2015, CT scanning from 2016, GI notes, labs previous colonoscopies he has not had an upper endoscopy before     Assessment & Plan:   1. LUQ pain   2. LLQ pain   3. Diverticulosis of colon without hemorrhage    We had thought he probably has symptomatic diverticulosis and he may. The onset of pain during and soon after eating suggest the possibility of a gastric problem. Question if his scoliosis and back problems have any relation though relationship to eating goes against that. He is an elderly man with the symptoms that we haven't completely explained but only partially helped and since CT has not shown a clear cause, colonoscopy is recent, we'll pursue upper endoscopy.The risks and benefits as well as alternatives of endoscopic procedure(s) have been discussed and reviewed. All questions answered. The patient agrees to proceed. I appreciate the opportunity to care for this patient.

## 2014-11-07 ENCOUNTER — Encounter: Payer: Self-pay | Admitting: Internal Medicine

## 2014-11-07 ENCOUNTER — Ambulatory Visit (AMBULATORY_SURGERY_CENTER): Payer: Commercial Managed Care - HMO | Admitting: Internal Medicine

## 2014-11-07 VITALS — BP 116/63 | HR 53 | Temp 96.8°F | Resp 20 | Ht 71.0 in | Wt 229.0 lb

## 2014-11-07 DIAGNOSIS — R1012 Left upper quadrant pain: Secondary | ICD-10-CM | POA: Diagnosis not present

## 2014-11-07 DIAGNOSIS — K2990 Gastroduodenitis, unspecified, without bleeding: Secondary | ICD-10-CM

## 2014-11-07 DIAGNOSIS — K2970 Gastritis, unspecified, without bleeding: Secondary | ICD-10-CM | POA: Diagnosis not present

## 2014-11-07 DIAGNOSIS — K319 Disease of stomach and duodenum, unspecified: Secondary | ICD-10-CM | POA: Diagnosis not present

## 2014-11-07 MED ORDER — SODIUM CHLORIDE 0.9 % IV SOLN: 500.0000 mL | INTRAVENOUS | Status: DC

## 2014-11-07 NOTE — Op Note (Signed)
[facility]  [address] [place], [address]   ENDOSCOPY PROCEDURE REPORT  PATIENT: Adeoluwa, Silvers  MR#: [id number] BIRTHDATE: 1935-05-25 , 78  yrs. old GENDER: male ENDOSCOPIST: Gatha Mayer, MD, [facility] PROCEDURE DATE:  11/07/2014 PROCEDURE:  EGD w/ biopsy ASA CLASS:     Class III INDICATIONS:  abdominal pain in upper left quadrant. MEDICATIONS: Propofol 120 mg IV and Monitored anesthesia care TOPICAL ANESTHETIC: none  DESCRIPTION OF PROCEDURE: After the risks benefits and alternatives of the procedure were thoroughly explained, informed consent was obtained.  The LB [id number] endoscope was introduced through the mouth and advanced to the second portion of the duodenum , Without limitations.  The instrument was slowly withdrawn as the mucosa was fully examined.    1) One erosion and mottled + erythematous antrum = gastritis. Biopsies taken. 2) Otherwise normal.  Retroflexed views revealed no abnormalities. The scope was then withdrawn from the patient and the procedure completed.  COMPLICATIONS: There were no immediate complications.  ENDOSCOPIC IMPRESSION: 1) One erosion and mottled + erythematous antrum = gastritis. Biopsies taken. 2) Otherwise normal  RECOMMENDATIONS: Office will call with results Due for Chest CT in July - to be done through PCP REPEAT EXAM:  eSigned:  Gatha Mayer, MD, [facility] 11/07/2014 7:55 AM    CC:The Patient and Dr. Margaretmary Eddy

## 2014-11-07 NOTE — Patient Instructions (Addendum)
The stomach looks irritated - we call that gastritis. I took biopsies to help understand better and will call with results - next week.  I appreciate the opportunity to care for you. Gatha Mayer, MD, FACG   YOU HAD AN ENDOSCOPIC PROCEDURE TODAY AT [facility]:   Refer to the procedure report that was given to you for any specific questions about what was found during the examination.  If the procedure report does not answer your questions, please call your gastroenterologist to clarify.  If you requested that your care partner not be given the details of your procedure findings, then the procedure report has been included in a sealed envelope for you to review at your convenience later.  YOU SHOULD EXPECT: Some feelings of bloating in the abdomen. Passage of more gas than usual.  Walking can help get rid of the air that was put into your GI tract during the procedure and reduce the bloating. If you had a lower endoscopy (such as a colonoscopy or flexible sigmoidoscopy) you may notice spotting of blood in your stool or on the toilet paper. If you underwent a bowel prep for your procedure, you may not have a normal bowel movement for a few days.  Please Note:  You might notice some irritation and congestion in your nose or some drainage.  This is from the oxygen used during your procedure.  There is no need for concern and it should clear up in a day or so.  SYMPTOMS TO REPORT IMMEDIATELY:    Following upper endoscopy (EGD)  Vomiting of blood or coffee ground material  New chest pain or pain under the shoulder blades  Painful or persistently difficult swallowing  New shortness of breath  Fever of 100F or higher  Black, tarry-looking stools  For urgent or emergent issues, a gastroenterologist can be reached at any hour by calling [phone].   DIET: Your first meal following the procedure should be a small meal.  Likewise, meals heavy in dairy and vegetables  can increase bloating.  Drink plenty of fluids but you should avoid alcoholic beverages for 24 hours.  ACTIVITY:  You should plan to take it easy for the rest of today and you should NOT DRIVE or use heavy machinery until tomorrow (because of the sedation medicines used during the test).    FOLLOW UP: Our staff will call the number listed on your records the next business day following your procedure to check on you and address any questions or concerns that you may have regarding the information given to you following your procedure. If we do not reach you, we will leave a message.  However, if you are feeling well and you are not experiencing any problems, there is no need to return our call.  We will assume that you have returned to your regular daily activities without incident.  If any biopsies were taken you will be contacted by phone or by letter within the next 1-3 weeks.  Please call us at [phone] if you have not heard about the biopsies in 3 weeks.    SIGNATURES/CONFIDENTIALITY: You and/or your care partner have signed paperwork which will be entered into your electronic medical record.  These signatures attest to the fact that that the information above on your After Visit Summary has been reviewed and is understood.  Full responsibility of the confidentiality of this discharge information lies with you and/or your care-partner.  Read all of the handouts  given to you by your recovery room nurse.

## 2014-11-07 NOTE — Progress Notes (Signed)
Called to room to assist during endoscopic procedure.  Patient ID and intended procedure confirmed with present staff. Received instructions for my participation in the procedure from the performing physician.  

## 2014-11-07 NOTE — Progress Notes (Signed)
To recovery, report to Hodges, RN, VSS 

## 2014-11-11 ENCOUNTER — Telehealth: Payer: Self-pay | Admitting: *Deleted

## 2014-11-11 NOTE — Telephone Encounter (Signed)
Left message on f/u callback 

## 2014-11-18 NOTE — Progress Notes (Signed)
Quick Note:  Bxs ok - no cancer, mild inflammation Would continue current Rx - if he is not satisfied with that Tx then REV me next avail not urgent ______

## 2014-12-04 ENCOUNTER — Telehealth: Payer: Self-pay | Admitting: *Deleted

## 2014-12-04 NOTE — Telephone Encounter (Signed)
Submitted humana referral thru acuity connect for authorization to [place] with authorization number [id number]  Requesting provider: Flonnie Hailstone  Treating provider: [facility]ology  Number of visits: 6  Start Date:12/03/14  End Date:06/01/15  Dx:R91.1-Solitary pulmonary nodule  Place of service: Physicians office  Copy has been faxed to [facility] imaging for review and records

## 2014-12-04 NOTE — Telephone Encounter (Signed)
Submitted humana referral thru acuity connect for authorization to [place] with authorization number [id number]  Requesting provider: Flonnie Hailstone  Treating provider: [facility]  Number of visits: 1  Start Date:12/04/14  End Date:06/02/15  Dx:R91.1-Solitary pulmonary nodule  Place of service: Physicians office  Type of service: CT  Procedure:71250-Ct-Thorax w/o dye  Copy has been faxed to [facility] imaging for review and records

## 2014-12-06 ENCOUNTER — Ambulatory Visit
Admission: RE | Admit: 2014-12-06 | Discharge: 2014-12-06 | Disposition: A | Discharge status: Home / Self Care | Payer: Commercial Managed Care - HMO | Source: Ambulatory Visit | Attending: Family Medicine | Admitting: Family Medicine

## 2014-12-06 DIAGNOSIS — R911 Solitary pulmonary nodule: Secondary | ICD-10-CM

## 2014-12-07 ENCOUNTER — Emergency Department (HOSPITAL_COMMUNITY)
Admission: EM | Admit: 2014-12-07 | Discharge: 2014-12-07 | Disposition: A | Discharge status: Home / Self Care | Payer: Commercial Managed Care - HMO | Attending: Emergency Medicine | Admitting: Emergency Medicine

## 2014-12-07 ENCOUNTER — Emergency Department (HOSPITAL_COMMUNITY): Payer: Commercial Managed Care - HMO

## 2014-12-07 ENCOUNTER — Encounter (HOSPITAL_COMMUNITY): Payer: Self-pay | Admitting: *Deleted

## 2014-12-07 DIAGNOSIS — Z7982 Long term (current) use of aspirin: Secondary | ICD-10-CM | POA: Diagnosis not present

## 2014-12-07 DIAGNOSIS — Z79899 Other long term (current) drug therapy: Secondary | ICD-10-CM | POA: Diagnosis not present

## 2014-12-07 DIAGNOSIS — Z8673 Personal history of transient ischemic attack (TIA), and cerebral infarction without residual deficits: Secondary | ICD-10-CM | POA: Insufficient documentation

## 2014-12-07 DIAGNOSIS — M19041 Primary osteoarthritis, right hand: Secondary | ICD-10-CM | POA: Diagnosis not present

## 2014-12-07 DIAGNOSIS — Z86010 Personal history of colonic polyps: Secondary | ICD-10-CM | POA: Insufficient documentation

## 2014-12-07 DIAGNOSIS — Z87891 Personal history of nicotine dependence: Secondary | ICD-10-CM | POA: Diagnosis not present

## 2014-12-07 DIAGNOSIS — K219 Gastro-esophageal reflux disease without esophagitis: Secondary | ICD-10-CM | POA: Diagnosis not present

## 2014-12-07 DIAGNOSIS — I10 Essential (primary) hypertension: Secondary | ICD-10-CM | POA: Insufficient documentation

## 2014-12-07 DIAGNOSIS — Z8669 Personal history of other diseases of the nervous system and sense organs: Secondary | ICD-10-CM | POA: Insufficient documentation

## 2014-12-07 DIAGNOSIS — L03114 Cellulitis of left upper limb: Secondary | ICD-10-CM | POA: Insufficient documentation

## 2014-12-07 DIAGNOSIS — M169 Osteoarthritis of hip, unspecified: Secondary | ICD-10-CM | POA: Diagnosis not present

## 2014-12-07 DIAGNOSIS — M47819 Spondylosis without myelopathy or radiculopathy, site unspecified: Secondary | ICD-10-CM | POA: Insufficient documentation

## 2014-12-07 DIAGNOSIS — M19042 Primary osteoarthritis, left hand: Secondary | ICD-10-CM | POA: Insufficient documentation

## 2014-12-07 DIAGNOSIS — J449 Chronic obstructive pulmonary disease, unspecified: Secondary | ICD-10-CM | POA: Insufficient documentation

## 2014-12-07 MED ORDER — SULFAMETHOXAZOLE-TRIMETHOPRIM 800-160 MG PO TABS: 1.0000 | ORAL_TABLET | Freq: Two times a day (BID) | ORAL | Status: AC

## 2014-12-07 MED ORDER — SULFAMETHOXAZOLE-TRIMETHOPRIM 800-160 MG PO TABS
1.0000 | ORAL_TABLET | Freq: Once | ORAL | Status: AC
  Administered 2014-12-07: 1 via ORAL
  Filled 2014-12-07: qty 1

## 2014-12-07 NOTE — ED Notes (Signed)
The pt is c/o pain and swelling in his lt hand palmar surface.  He had a splinter  Lodged in his lt hand  Just beneath his little finger .  He removed a portion of it but he was unable to get the rest out.  Increasing pain with swelling

## 2014-12-07 NOTE — ED Provider Notes (Signed)
CSN: [id number]     Arrival date & time 12/07/14  2055 History  This chart was scribed for Ottie Glazier, PA-C, working with Francine Graven, DO by Starleen Arms, ED Scribe. This patient was seen in room C28C/C28C and the patient's care was started at 9:45 PM.   Chief Complaint  Patient presents with  . Foreign Body in Skin   The history is provided by the patient. No language interpreter was used.   HPI Comments: Brendan Hopkins is a 79 y.o. male who presents to the Emergency Department complaining of a splinter in the palm of the left hand onset several days ago with associated mild pain and swelling.  The patient reports he was able to remove part of the wooden splinter with a pair of pliers, but he thinks a piece is still lodged.  He has a prior history of infection after a similar incident in the opposite hand.  Patient is currently taking anti-coagulants.  He denies any fever or chills.  Past Medical History  Diagnosis Date  . Hypertension   . TIA (transient ischemic attack)   . Diverticulosis     2005  . Colon polyps     2005  . RBBB     POSS HX AF IN PAST- PAC'S  . COPD (chronic obstructive pulmonary disease)     SMOKED FOR 28 YRS  . Shortness of breath     WITH EXERTION ONLY  . Stroke     FEW YEARS AGO - PT SAYS "HEAT STROKE" X 2 - BUT NO PHYSICAL DEFICITS  . GERD (gastroesophageal reflux disease)   . Pain     RIGHT HIP AND BACK  . Arthritis     RIGHT HIP, HANDS, BACK  . Personal history of colonic polyps-adenoma and sessile serrated polyp 07/15/2010  . Cataract   . Fatty liver    Past Surgical History  Procedure Laterality Date  . Total hip arthroplasty Right 01/09/2013    Procedure: RIGHT TOTAL HIP ARTHROPLASTY ANTERIOR APPROACH;  Surgeon: [name], MD;  Location: [facility]S;  Service: Orthopedics;  Laterality: Right;  . Colonoscopy     Family History  Problem Relation Age of Onset  . Cancer Father   . Cancer Mother     died more of old age at age 24  .  Colon cancer Neg Hx   . Esophageal cancer Neg Hx   . Prostate cancer Neg Hx   . Rectal cancer Neg Hx   . Stomach cancer Neg Hx    History  Substance Use Topics  . Smoking status: Former Smoker -- 72 years    Types: Cigarettes    Quit date: 05/25/2007  . Smokeless tobacco: Never Used  . Alcohol Use: No    Review of Systems  Constitutional: Negative for fever.  Skin: Positive for wound.      Allergies  Review of patient's allergies indicates no known allergies.  Home Medications   Prior to Admission medications   Medication Sig Start Date End Date Taking? Authorizing Provider  amLODipine (NORVASC) 5 MG tablet TAKE ONE TABLET BY MOUTH ONE TIME DAILY 09/11/14   [name], MD  aspirin EC 81 MG tablet Take 81 mg by mouth daily.    Historical Provider, MD  dicyclomine (BENTYL) 10 MG capsule TAKE TWO CAPSULES BY MOUTH EVERY SIX HOURS 10/18/14   Lori P Hvozdovic, PA-C  fish oil-omega-3 fatty acids 1000 MG capsule Take 1 g by mouth daily.    Historical  Provider, MD  ibuprofen (ADVIL,MOTRIN) 100 MG/5ML suspension Take 200 mg by mouth every 4 (four) hours as needed.    Historical Provider, MD  Multiple Vitamin (MULTIVITAMIN WITH MINERALS) TABS Take 1 tablet by mouth daily.    Historical Provider, MD  pantoprazole (PROTONIX) 40 MG tablet TAKE ONE TABLET BY MOUTH ONE TIME DAILY 10/07/14   [name], MD  sulfamethoxazole-trimethoprim (BACTRIM DS,SEPTRA DS) 800-160 MG per tablet Take 1 tablet by mouth 2 (two) times daily. 12/07/14 12/14/14  Lashae Wollenberg Patel-Mills, PA-C   BP 136/84 mmHg  Pulse 68  Temp(Src) 97.9 F (36.6 C) (Oral)  Resp 16  Ht 5\' 8"  (1.727 m)  Wt 225 lb 8 oz (102.286 kg)  BMI 34.30 kg/m2  SpO2 98% Physical Exam  Constitutional: He is oriented to person, place, and time. He appears well-developed and well-nourished. No distress.  HENT:  Head: Normocephalic and atraumatic.  Eyes: Conjunctivae and EOM are normal.  Neck: Neck supple. No tracheal deviation present.   Cardiovascular: Normal rate.   Pulmonary/Chest: Effort normal. No respiratory distress.  Musculoskeletal: Normal range of motion.  He is able to flex and extend all fingers. Good radial pulse. He has a 2 cm area of erythema and tenderness to palpation along the palmar surface of the fifth metacarpal without drainage or signs of necrosis.  Neurological: He is alert and oriented to person, place, and time.  Skin: Skin is warm and dry.  Psychiatric: He has a normal mood and affect. His behavior is normal.  Nursing note and vitals reviewed.   ED Course  Procedures (including critical care time)  DIAGNOSTIC STUDIES: Oxygen Saturation is 96% on RA, adequate by my interpretation.    COORDINATION OF CARE:  9:51 PM Discussed treatment plan with patient at bedside.  Patient acknowledges and agrees with plan.    Labs Review Labs Reviewed - No data to display  Imaging Review Ct Chest Wo Contrast  12/06/2014   CLINICAL DATA:  Six month followup lung nodules.  EXAM: CT CHEST WITHOUT CONTRAST  TECHNIQUE: Multidetector CT imaging of the chest was performed following the standard protocol without IV contrast.  COMPARISON:  09/22/2012  FINDINGS: Mediastinum: The heart size appears normal. There is no pericardial effusion. The trachea appears patent and is midline. Normal appearance of the esophagus. Aortic atherosclerosis. Calcifications within the LAD coronary artery noted. No mediastinal or hilar adenopathy.  Lungs/Pleura: Moderate changes of centrilobular emphysema. There is no airspace consolidation. Chronic pleural and parenchymal scarring in the left lung base noted. Right lower lobe lung nodule measures 7 mm, image 33/series 4. This measured 6 mm in 2011 and is favored to represent a benign process. No new pulmonary nodules identified.  Upper Abdomen: There is stress that the visualized portions of the liver and spleen are normal. There is a small calcification within the left adrenal gland. Small  bilateral renal calculi noted.  Musculoskeletal: Multi level degenerative disc disease is noted within the lower thoracic spine. No aggressive lytic or sclerotic bone lesions.  IMPRESSION: 1. No acute cardiopulmonary abnormalities. 2. Chronic scarring in the left lung base. 3. Emphysema 4. Aortic atherosclerosis 5. No significant change in the appearance of right lower lobe nodule compared with 08/19/2009 consistent with a benign process.   Electronically Signed   By: Kerby Moors M.D.   On: 12/06/2014 15:53   Dg Hand 2 View Left  12/07/2014   CLINICAL DATA:  Left hand splinter/foreign body  EXAM: LEFT HAND - 2 VIEW  COMPARISON:  Bilateral  hand radiographs dated 07/13/2005  FINDINGS: No fracture or dislocation is seen.  Moderate degenerative changes of the 1st CMC joint. Mild degenerative changes at the 1st MCP joint.  No radiopaque foreign body is seen.  IMPRESSION: No radiopaque foreign body is seen.   Electronically Signed   By: Julian Hy M.D.   On: 12/07/2014 21:50     EKG Interpretation None      MDM   Final diagnoses:  Cellulitis of hand, left  Vitals are stable and patient is in no acute distress. X-ray is negative for any foreign body. The area from which the splinter was removed appears to be the beginning of an infection. Due to the patient's comorbidities I will place the patient on anabiotic's and he can follow up with his primary care physician. I gave the patient return precautions. Patient verbally agrees with the plan. Medications  sulfamethoxazole-trimethoprim (BACTRIM DS,SEPTRA DS) 800-160 MG per tablet 1 tablet (1 tablet Oral Given 12/07/14 2221)  I personally performed the services described in this documentation, which was scribed in my presence. The recorded information has been reviewed and is accurate.    Ottie Glazier, PA-C 12/08/14 [place], DO 12/10/14 2206

## 2014-12-07 NOTE — Discharge Instructions (Signed)
Cellulitis Apply warm compresses to the area several times a day. Take anabiotic says prescribed. Follow-up with your primary care physician in 2 days. Return for fever or worsening symptoms including spreading redness or pain. Cellulitis is an infection of the skin and the tissue under the skin. The infected area is usually red and tender. This happens most often in the arms and lower legs. HOME CARE   Take your antibiotic medicine as told. Finish the medicine even if you start to feel better.  Keep the infected arm or leg raised (elevated).  Put a warm cloth on the area up to 4 times per day.  Only take medicines as told by your doctor.  Keep all doctor visits as told. GET HELP IF:  You see red streaks on the skin coming from the infected area.  Your red area gets bigger or turns a dark color.  Your bone or joint under the infected area is painful after the skin heals.  Your infection comes back in the same area or different area.  You have a puffy (swollen) bump in the infected area.  You have new symptoms.  You have a fever. GET HELP RIGHT AWAY IF:   You feel very sleepy.  You throw up (vomit) or have watery poop (diarrhea).  You feel sick and have muscle aches and pains. MAKE SURE YOU:   Understand these instructions.  Will watch your condition.  Will get help right away if you are not doing well or get worse. Document Released: 10/27/2007 Document Revised: 09/24/2013 Document Reviewed: 07/26/2011 [facility] Patient Information 2015 [place], [place]. This information is not intended to replace advice given to you by your health care provider. Make sure you discuss any questions you have with your health care provider.

## 2014-12-19 ENCOUNTER — Ambulatory Visit (INDEPENDENT_AMBULATORY_CARE_PROVIDER_SITE_OTHER): Payer: Commercial Managed Care - HMO | Admitting: Family Medicine

## 2014-12-19 ENCOUNTER — Encounter: Payer: Self-pay | Admitting: Family Medicine

## 2014-12-19 VITALS — BP 146/80 | HR 78 | Temp 98.3°F | Resp 20 | Ht 71.0 in | Wt 222.0 lb

## 2014-12-19 DIAGNOSIS — Z23 Encounter for immunization: Secondary | ICD-10-CM | POA: Diagnosis not present

## 2014-12-19 DIAGNOSIS — Z0000 Encounter for general adult medical examination without abnormal findings: Secondary | ICD-10-CM | POA: Diagnosis not present

## 2014-12-19 LAB — COMPLETE METABOLIC PANEL WITH GFR
ALK PHOS: 84 U/L (ref 40–115)
ALT: 16 U/L (ref 9–46)
AST: 21 U/L (ref 10–35)
Albumin: 4.6 g/dL (ref 3.6–5.1)
BILIRUBIN TOTAL: 1.2 mg/dL (ref 0.2–1.2)
BUN: 26 mg/dL — ABNORMAL HIGH (ref 7–25)
CALCIUM: 9.6 mg/dL (ref 8.6–10.3)
CO2: 23 mEq/L (ref 20–31)
Chloride: 104 mEq/L (ref 98–110)
Creat: 0.95 mg/dL (ref 0.70–1.18)
GFR, Est Non African American: 77 mL/min (ref >=60)
Glucose, Bld: 97 mg/dL (ref 70–99)
POTASSIUM: 4.3 meq/L (ref 3.5–5.3)
SODIUM: 141 meq/L (ref 135–146)
TOTAL PROTEIN: 7.2 g/dL (ref 6.1–8.1)

## 2014-12-19 LAB — CBC WITH DIFFERENTIAL/PLATELET
BASOS PCT: 1 % (ref 0–1)
Basophils Absolute: 0.1 10*3/uL (ref 0.0–0.1)
EOS PCT: 4 % (ref 0–5)
Eosinophils Absolute: 0.3 10*3/uL (ref 0.0–0.7)
HEMATOCRIT: 43.7 % (ref 39.0–52.0)
Hemoglobin: 15.6 g/dL (ref 13.0–17.0)
Lymphocytes Relative: 25 % (ref 12–46)
Lymphs Abs: 2 10*3/uL (ref 0.7–4.0)
MCH: 33.2 pg (ref 26.0–34.0)
MCHC: 35.7 g/dL (ref 30.0–36.0)
MCV: 93 fL (ref 78.0–100.0)
MONO ABS: 0.6 10*3/uL (ref 0.1–1.0)
MPV: 10.1 fL (ref 8.6–12.4)
Monocytes Relative: 7 % (ref 3–12)
Neutro Abs: 5 10*3/uL (ref 1.7–7.7)
Neutrophils Relative %: 63 % (ref 43–77)
Platelets: 193 10*3/uL (ref 150–400)
RBC: 4.7 MIL/uL (ref 4.22–5.81)
RDW: 13.2 % (ref 11.5–15.5)
WBC: 7.9 10*3/uL (ref 4.0–10.5)

## 2014-12-19 LAB — LIPID PANEL
Cholesterol: 193 mg/dL (ref 125–200)
HDL: 52 mg/dL (ref >=40)
LDL Cholesterol: 125 mg/dL (ref <130)
Total CHOL/HDL Ratio: 3.7 Ratio (ref <=5.0)
Triglycerides: 78 mg/dL (ref <150)
VLDL: 16 mg/dL (ref <30)

## 2014-12-19 NOTE — Addendum Note (Signed)
Addended by: Shary Decamp B on: 12/19/2014 10:41 AM   Modules accepted: Orders

## 2014-12-19 NOTE — Progress Notes (Signed)
Subjective:    Patient ID: Brendan Hopkins, male    DOB: July 24, 1935, 79 y.o.   MRN: [id number]  HPI Patient is here today for complete physical exam. He denies any concerns. He has had Prevnar 13. He is due for Pneumovax 23 today. He is due for the shingles vaccine but the patient has had shingles and due to the cost of the vaccine he declines it. His colonoscopy was last checked August 2015. He is due for prostate exam today but he declines a digital rectal exam. He will consent to a PSA. Past Medical History  Diagnosis Date  . Hypertension   . TIA (transient ischemic attack)   . Diverticulosis     2005  . Colon polyps     2005  . RBBB     POSS HX AF IN PAST- PAC'S  . COPD (chronic obstructive pulmonary disease)     SMOKED FOR 74 YRS  . Shortness of breath     WITH EXERTION ONLY  . Stroke     FEW YEARS AGO - PT SAYS "HEAT STROKE" X 2 - BUT NO PHYSICAL DEFICITS  . GERD (gastroesophageal reflux disease)   . Pain     RIGHT HIP AND BACK  . Arthritis     RIGHT HIP, HANDS, BACK  . Personal history of colonic polyps-adenoma and sessile serrated polyp 07/15/2010  . Cataract   . Fatty liver    Past Surgical History  Procedure Laterality Date  . Total hip arthroplasty Right 01/09/2013    Procedure: RIGHT TOTAL HIP ARTHROPLASTY ANTERIOR APPROACH;  Surgeon: [name], MD;  Location: [facility];  Service: Orthopedics;  Laterality: Right;  . Colonoscopy     Current Outpatient Prescriptions on File Prior to Visit  Medication Sig Dispense Refill  . amLODipine (NORVASC) 5 MG tablet TAKE ONE TABLET BY MOUTH ONE TIME DAILY 30 tablet 11  . aspirin EC 81 MG tablet Take 81 mg by mouth daily.    Marland Kitchen dicyclomine (BENTYL) 10 MG capsule TAKE TWO CAPSULES BY MOUTH EVERY SIX HOURS 240 capsule 2  . fish oil-omega-3 fatty acids 1000 MG capsule Take 1 g by mouth daily.    Marland Kitchen ibuprofen (ADVIL,MOTRIN) 100 MG/5ML suspension Take 200 mg by mouth every 4 (four) hours as needed.    . Multiple Vitamin  (MULTIVITAMIN WITH MINERALS) TABS Take 1 tablet by mouth daily.    . pantoprazole (PROTONIX) 40 MG tablet TAKE ONE TABLET BY MOUTH ONE TIME DAILY 30 tablet 11   No current facility-administered medications on file prior to visit.   No Known Allergies History   Social History  . Marital Status: Married    Spouse Name: N/A  . Number of Children: N/A  . Years of Education: N/A   Occupational History  . Retired    Social History Main Topics  . Smoking status: Former Smoker -- 89 years    Types: Cigarettes    Quit date: 05/25/2007  . Smokeless tobacco: Never Used  . Alcohol Use: No  . Drug Use: No  . Sexual Activity: Not on file   Other Topics Concern  . 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 Cancer Father   . Cancer Mother     died more of old age at age 35  . Colon cancer Neg Hx   . Esophageal  cancer Neg Hx   . Prostate cancer Neg Hx   . Rectal cancer Neg Hx   . Stomach cancer Neg Hx       Review of Systems  All other systems reviewed and are negative.      Objective:   Physical Exam  Constitutional: He is oriented to person, place, and time. He appears well-developed and well-nourished. No distress.  HENT:  Head: Normocephalic and atraumatic.  Right Ear: External ear normal.  Left Ear: External ear normal.  Nose: Nose normal.  Mouth/Throat: Oropharynx is clear and moist. No oropharyngeal exudate.  Eyes: Conjunctivae and EOM are normal. Pupils are equal, round, and reactive to light. Right eye exhibits no discharge. Left eye exhibits no discharge. No scleral icterus.  Neck: Normal range of motion. Neck supple. No JVD present. No tracheal deviation present. No thyromegaly present.  Cardiovascular: Normal rate, regular rhythm, normal heart sounds and intact distal pulses.  Exam reveals no gallop and no friction rub.   No murmur  heard. Pulmonary/Chest: Effort normal and breath sounds normal. No stridor. No respiratory distress. He has no wheezes. He has no rales. He exhibits no tenderness.  Abdominal: Soft. Bowel sounds are normal. He exhibits no distension and no mass. There is no tenderness. There is no rebound and no guarding.  Musculoskeletal: Normal range of motion. He exhibits no edema or tenderness.  Lymphadenopathy:    He has no cervical adenopathy.  Neurological: He is alert and oriented to person, place, and time. He has normal reflexes. He displays normal reflexes. No cranial nerve deficit. He exhibits normal muscle tone. Coordination normal.  Skin: Skin is warm. No rash noted. He is not diaphoretic. No erythema. No pallor.  Psychiatric: He has a normal mood and affect. His behavior is normal. Judgment and thought content normal.  Vitals reviewed.         Assessment & Plan:  Routine general medical examination at a health care facility - Plan: CBC with Differential/Platelet, COMPLETE METABOLIC PANEL WITH GFR, Lipid panel, PSA, Medicare  Patient's physical exam is completely normal. Vaccinations are updated. Patient received Pneumovax 23 today. His cancer screening is up-to-date. Patient's colonoscopy was performed last year. His PSA will be performed today. He declines a digital rectal exam. I will check a CBC, CMP, fasting lipid panel, and a PSA

## 2014-12-20 ENCOUNTER — Encounter: Payer: Self-pay | Admitting: *Deleted

## 2014-12-20 LAB — PSA, MEDICARE: PSA: 1.03 ng/mL (ref <=4.00)

## 2015-01-15 ENCOUNTER — Other Ambulatory Visit: Payer: Self-pay | Admitting: Family Medicine

## 2015-01-15 ENCOUNTER — Other Ambulatory Visit: Payer: Self-pay | Admitting: Physician Assistant

## 2015-01-16 NOTE — Telephone Encounter (Signed)
ok 

## 2015-01-16 NOTE — Telephone Encounter (Signed)
Ok to refill 

## 2015-01-16 NOTE — Telephone Encounter (Signed)
Refill appropriate and filled per protocol. 

## 2015-01-30 DIAGNOSIS — Z8673 Personal history of transient ischemic attack (TIA), and cerebral infarction without residual deficits: Secondary | ICD-10-CM | POA: Diagnosis not present

## 2015-01-30 DIAGNOSIS — S50812A Abrasion of left forearm, initial encounter: Secondary | ICD-10-CM | POA: Diagnosis not present

## 2015-01-30 DIAGNOSIS — Y9389 Activity, other specified: Secondary | ICD-10-CM | POA: Insufficient documentation

## 2015-01-30 DIAGNOSIS — S3992XA Unspecified injury of lower back, initial encounter: Secondary | ICD-10-CM | POA: Insufficient documentation

## 2015-01-30 DIAGNOSIS — K219 Gastro-esophageal reflux disease without esophagitis: Secondary | ICD-10-CM | POA: Diagnosis not present

## 2015-01-30 DIAGNOSIS — J449 Chronic obstructive pulmonary disease, unspecified: Secondary | ICD-10-CM | POA: Diagnosis not present

## 2015-01-30 DIAGNOSIS — Z7982 Long term (current) use of aspirin: Secondary | ICD-10-CM | POA: Insufficient documentation

## 2015-01-30 DIAGNOSIS — Z79899 Other long term (current) drug therapy: Secondary | ICD-10-CM | POA: Diagnosis not present

## 2015-01-30 DIAGNOSIS — Y9289 Other specified places as the place of occurrence of the external cause: Secondary | ICD-10-CM | POA: Diagnosis not present

## 2015-01-30 DIAGNOSIS — Z87891 Personal history of nicotine dependence: Secondary | ICD-10-CM | POA: Diagnosis not present

## 2015-01-30 DIAGNOSIS — H269 Unspecified cataract: Secondary | ICD-10-CM | POA: Insufficient documentation

## 2015-01-30 DIAGNOSIS — W11XXXA Fall on and from ladder, initial encounter: Secondary | ICD-10-CM | POA: Insufficient documentation

## 2015-01-30 DIAGNOSIS — M158 Other polyosteoarthritis: Secondary | ICD-10-CM | POA: Diagnosis not present

## 2015-01-30 DIAGNOSIS — I10 Essential (primary) hypertension: Secondary | ICD-10-CM | POA: Insufficient documentation

## 2015-01-30 DIAGNOSIS — S299XXA Unspecified injury of thorax, initial encounter: Secondary | ICD-10-CM | POA: Diagnosis present

## 2015-01-30 DIAGNOSIS — Z86010 Personal history of colonic polyps: Secondary | ICD-10-CM | POA: Insufficient documentation

## 2015-01-30 DIAGNOSIS — Y998 Other external cause status: Secondary | ICD-10-CM | POA: Insufficient documentation

## 2015-01-30 DIAGNOSIS — S20212A Contusion of left front wall of thorax, initial encounter: Secondary | ICD-10-CM | POA: Insufficient documentation

## 2015-01-31 ENCOUNTER — Emergency Department (HOSPITAL_COMMUNITY): Payer: Commercial Managed Care - HMO

## 2015-01-31 ENCOUNTER — Emergency Department (HOSPITAL_COMMUNITY)
Admission: EM | Admit: 2015-01-31 | Discharge: 2015-01-31 | Disposition: A | Discharge status: Home / Self Care | Payer: Commercial Managed Care - HMO | Attending: Emergency Medicine | Admitting: Emergency Medicine

## 2015-01-31 ENCOUNTER — Encounter (HOSPITAL_COMMUNITY): Payer: Self-pay | Admitting: *Deleted

## 2015-01-31 DIAGNOSIS — S20212A Contusion of left front wall of thorax, initial encounter: Secondary | ICD-10-CM

## 2015-01-31 DIAGNOSIS — W11XXXA Fall on and from ladder, initial encounter: Secondary | ICD-10-CM

## 2015-01-31 MED ORDER — HYDROCODONE-ACETAMINOPHEN 5-325 MG PO TABS
1.0000 | ORAL_TABLET | Freq: Once | ORAL | Status: AC
  Administered 2015-01-31: 1 via ORAL
  Filled 2015-01-31: qty 1

## 2015-01-31 MED ORDER — HYDROCODONE-ACETAMINOPHEN 5-325 MG PO TABS: 2.0000 | ORAL_TABLET | ORAL | Status: DC | PRN

## 2015-01-31 NOTE — ED Notes (Addendum)
Pt reports he was working on a roof x 2 days ago, standing on two 5 foot ladder with a board and fell.  Landing on his back.  Pt reports L upper back pain.

## 2015-01-31 NOTE — Discharge Instructions (Signed)
Chest Contusion A chest contusion is a deep bruise on your chest area. Contusions are the result of an injury that caused bleeding under the skin. A chest contusion may involve bruising of the skin, muscles, or ribs. The contusion may turn blue, purple, or yellow. Minor injuries will give you a painless contusion, but more severe contusions may stay painful and swollen for a few weeks. CAUSES  A contusion is usually caused by a blow, trauma, or direct force to an area of the body. SYMPTOMS   Swelling and redness of the injured area.  Discoloration of the injured area.  Tenderness and soreness of the injured area.  Pain. DIAGNOSIS  The diagnosis can be made by taking a history and performing a physical exam. An X-ray, CT scan, or MRI may be needed to determine if there were any associated injuries, such as broken bones (fractures) or internal injuries. TREATMENT  Often, the best treatment for a chest contusion is resting, icing, and applying cold compresses to the injured area. Deep breathing exercises may be recommended to reduce the risk of pneumonia. Over-the-counter medicines may also be recommended for pain control. HOME CARE INSTRUCTIONS   Put ice on the injured area.  Put ice in a plastic bag.  Place a towel between your skin and the bag.  Leave the ice on for 15-20 minutes, 03-04 times a day.  Only take over-the-counter or prescription medicines as directed by your caregiver. Your caregiver may recommend avoiding anti-inflammatory medicines (aspirin, ibuprofen, and naproxen) for 48 hours because these medicines may increase bruising.  Rest the injured area.  Perform deep-breathing exercises as directed by your caregiver.  Stop smoking if you smoke.  Do not lift objects over 5 pounds (2.3 kg) for 3 days or longer if recommended by your caregiver. SEEK IMMEDIATE MEDICAL CARE IF:   You have increased bruising or swelling.  You have pain that is getting worse.  You have  difficulty breathing.  You have dizziness, weakness, or fainting.  You have blood in your urine or stool.  You cough up or vomit blood.  Your swelling or pain is not relieved with medicines. MAKE SURE YOU:   Understand these instructions.  Will watch your condition.  Will get help right away if you are not doing well or get worse. Document Released: 02/02/2001 Document Revised: 02/02/2012 Document Reviewed: 11/01/2011 [facility] Patient Information 2015 [place], [place]. This information is not intended to replace advice given to you by your health care provider. Make sure you discuss any questions you have with your health care provider. There is no evidence of rib fracture on your x-ray tonight he be given a prescription for Vicodin for severe pain. Please continue taking ibuprofen. Follow up with your primary care physician as needed

## 2015-01-31 NOTE — ED Provider Notes (Signed)
CSN: [id number]     Arrival date & time 01/30/15  2357 History   First MD Initiated Contact with Patient 01/31/15 0049     Chief Complaint  Patient presents with  . Fall     (Consider location/radiation/quality/duration/timing/severity/associated sxs/prior Treatment) HPI Comments: This is a healthy, active 79 year old man.  He states that on Tuesday he was on a 5 foot ladder when the ladder gave way causing him to fall, landing on his left side.  He hit his head on the stump and scratched his forearm on a 2 x 4.  He cleaned it out when back to work.  He took ibuprofen at home which she normally takes for his arthritic pain.  He cleaned up his arm and rested.  He worked the next day and he worked today as well, but after sitting on the sofa tonight he developed increased pain in his left posterior rib area that cause some shortness of breath when he moves. He denies any neck pain, loss of consciousness  Patient is a 79 y.o. male presenting with fall. The history is provided by the patient.  Fall This is a new problem. The current episode started in the past 7 days. The problem occurs constantly. The problem has been gradually worsening. Associated symptoms include arthralgias. Pertinent negatives include no coughing, fever, headaches, neck pain, rash or weakness. The symptoms are aggravated by exertion. He has tried NSAIDs for the symptoms. The treatment provided no relief.    Past Medical History  Diagnosis Date  . Hypertension   . TIA (transient ischemic attack)   . Diverticulosis     2005  . Colon polyps     2005  . RBBB     POSS HX AF IN PAST- PAC'S  . COPD (chronic obstructive pulmonary disease)     SMOKED FOR 51 YRS  . Shortness of breath     WITH EXERTION ONLY  . Stroke     FEW YEARS AGO - PT SAYS "HEAT STROKE" X 2 - BUT NO PHYSICAL DEFICITS  . GERD (gastroesophageal reflux disease)   . Pain     RIGHT HIP AND BACK  . Arthritis     RIGHT HIP, HANDS, BACK  . Personal history  of colonic polyps-adenoma and sessile serrated polyp 07/15/2010  . Cataract   . Fatty liver    Past Surgical History  Procedure Laterality Date  . Total hip arthroplasty Right 01/09/2013    Procedure: RIGHT TOTAL HIP ARTHROPLASTY ANTERIOR APPROACH;  Surgeon: [name], MD;  Location: [facility];  Service: Orthopedics;  Laterality: Right;  . Colonoscopy     Family History  Problem Relation Age of Onset  . Cancer Father   . Cancer Mother     died more of old age at age 48  . Colon cancer Neg Hx   . Esophageal cancer Neg Hx   . Prostate cancer Neg Hx   . Rectal cancer Neg Hx   . Stomach cancer Neg Hx    Social History  Substance Use Topics  . Smoking status: Former Smoker -- 73 years    Types: Cigarettes    Quit date: 05/25/2007  . Smokeless tobacco: Never Used  . Alcohol Use: No    Review of Systems  Constitutional: Negative for fever.  Respiratory: Negative for cough and shortness of breath.   Musculoskeletal: Positive for back pain and arthralgias. Negative for neck pain.  Skin: Positive for wound. Negative for rash.  Neurological: Negative for dizziness,  weakness and headaches.  All other systems reviewed and are negative.     Allergies  Review of patient's allergies indicates no known allergies.  Home Medications   Prior to Admission medications   Medication Sig Start Date End Date Taking? Authorizing Provider  amLODipine (NORVASC) 5 MG tablet TAKE ONE TABLET BY MOUTH ONE TIME DAILY 09/11/14   [name], MD  aspirin EC 81 MG tablet Take 81 mg by mouth daily.    Historical Provider, MD  dicyclomine (BENTYL) 10 MG capsule TAKE TWO CAPSULES BY MOUTH EVERY SIX HOURS 01/15/15   Gatha Mayer, MD  fish oil-omega-3 fatty acids 1000 MG capsule Take 1 g by mouth daily.    Historical Provider, MD  HYDROcodone-acetaminophen (NORCO/VICODIN) 5-325 MG per tablet Take 2 tablets by mouth every 4 (four) hours as needed. 01/31/15   Junius Creamer, NP  ibuprofen (ADVIL,MOTRIN) 100  MG/5ML suspension Take 200 mg by mouth every 4 (four) hours as needed.    Historical Provider, MD  meclizine (ANTIVERT) 25 MG tablet TAKE ONE TABLET BY MOUTH THREE TIMES DAILY AS NEEDED 01/16/15   [name], MD  Multiple Vitamin (MULTIVITAMIN WITH MINERALS) TABS Take 1 tablet by mouth daily.    Historical Provider, MD  pantoprazole (PROTONIX) 40 MG tablet TAKE ONE TABLET BY MOUTH ONE TIME DAILY 10/07/14   [name], MD   BP 156/93 mmHg  Pulse 81  Temp(Src) 98.1 F (36.7 C) (Oral)  Resp 18  SpO2 95% Physical Exam  Constitutional: He is oriented to person, place, and time. He appears well-developed and well-nourished.  HENT:  Head: Normocephalic.  Eyes: Pupils are equal, round, and reactive to light.  Neck: Normal range of motion.  Cardiovascular: Normal rate and regular rhythm.   Pulmonary/Chest: Effort normal and breath sounds normal. He exhibits tenderness.  Musculoskeletal: Normal range of motion.  Neurological: He is alert and oriented to person, place, and time.  Skin: Skin is warm and dry.  Superficial linear abrasion along the lateral left forearm, without surrounding erythema.  Scab has formed  Nursing note and vitals reviewed.   ED Course  Procedures (including critical care time) Labs Review Labs Reviewed - No data to display  Imaging Review Dg Ribs Unilateral W/chest Left  01/31/2015   CLINICAL DATA:  79 year old male with fall  EXAM: LEFT RIBS AND CHEST - 3+ VIEW  COMPARISON:  CT dated 12/06/2014  FINDINGS: Emphysema. Left lung base atelectatic changes/scarring. Pneumonia is not excluded. No pneumothorax. No pleural effusion. The cardiac silhouette is within normal limits.  Osteopenia with degenerative changes of the spine. No rib fracture identified.  IMPRESSION: No evidence of rib fracture or pneumothorax.   Electronically Signed   By: Anner Crete M.D.   On: 01/31/2015 02:37   I have personally reviewed and evaluated these images and lab results as part  of my medical decision-making.   EKG Interpretation None     No other fractures noted on his x-ray has been given a prescription for Vicodin and follow-up with his primary care physician [facility]DM   Final diagnoses:  Fall from ladder, initial encounter  Rib contusion, left, initial encounter         Junius Creamer, NP 01/31/15 0300  April Palumbo, MD 01/31/15 [phone]

## 2015-02-27 ENCOUNTER — Other Ambulatory Visit: Payer: Self-pay | Admitting: Internal Medicine

## 2015-02-27 NOTE — Telephone Encounter (Signed)
Spoke with patient, he is taking the dicyclomine 10mg  tabs as follows:  2 with breakfast, 2 at lunch, 3 with supper, and 2 at bedtime.  He said it is really helping.

## 2015-02-27 NOTE — Telephone Encounter (Signed)
Spoke with patient and he said the ladder sunk in the ground and fell , he didn't fall off the ladder.  He will try to reduce the dose.

## 2015-02-27 NOTE — Telephone Encounter (Signed)
This medicine can make him dizzy somewhat and I saw he fell off a ladder so I am concerned. I'm glad it's helping but would like to see if we could perhaps reduce the dose. I will refill it but I want him to try to cut back on it to see if he continues only one at a time. You can refill at 2

## 2015-02-27 NOTE — Telephone Encounter (Signed)
Please advise how many refills Sir, thank you. 

## 2015-02-27 NOTE — Telephone Encounter (Signed)
Find out how he is taking thisplease 10, 20 mg and how often  Thanks

## 2015-03-14 ENCOUNTER — Other Ambulatory Visit (INDEPENDENT_AMBULATORY_CARE_PROVIDER_SITE_OTHER): Payer: Commercial Managed Care - HMO | Admitting: Family Medicine

## 2015-03-14 DIAGNOSIS — Z23 Encounter for immunization: Secondary | ICD-10-CM

## 2015-04-14 ENCOUNTER — Other Ambulatory Visit: Payer: Self-pay | Admitting: Internal Medicine

## 2015-04-15 NOTE — Telephone Encounter (Signed)
May I refill Sir? 

## 2015-04-16 NOTE — Telephone Encounter (Signed)
Refill x 6 We know he is elderly so accept the rx

## 2015-09-11 ENCOUNTER — Other Ambulatory Visit: Payer: Self-pay | Admitting: Family Medicine

## 2015-10-07 ENCOUNTER — Other Ambulatory Visit: Payer: Self-pay | Admitting: Family Medicine

## 2015-10-07 NOTE — Telephone Encounter (Signed)
Refill appropriate and filled per protocol. 

## 2015-11-11 ENCOUNTER — Other Ambulatory Visit: Payer: Self-pay | Admitting: Family Medicine

## 2015-11-11 NOTE — Telephone Encounter (Signed)
Refill appropriate and filled per protocol. 

## 2015-11-19 ENCOUNTER — Telehealth: Payer: Self-pay | Admitting: Internal Medicine

## 2015-11-19 ENCOUNTER — Telehealth: Payer: Self-pay | Admitting: Family Medicine

## 2015-11-19 NOTE — Telephone Encounter (Signed)
Pt had blood in his stool this morning and he would like to know if he needs to wait until Dr. Dennard Schaumann comes back to see him or if he should be scheduled for a colonoscopy. [phone]

## 2015-11-19 NOTE — Telephone Encounter (Signed)
Spoke to pt and he is having a lot of bright red blood in his stool when he has BM's. I recommended that he needs to be seen but Dr. Dennard Schaumann is OOT this week and he would have to see Olean Ree and he is not comfortable seeing a male so i suggested he call Dr. Carlean Purl and see what they tell him and if we need to do referral we will be more then happy to assist with that referral.

## 2015-11-19 NOTE — Telephone Encounter (Signed)
The pt had 2 episodes of BRB per rectum today, this is the first time this has happened in several months.  The pt has an appt in August with Dr Carlean Purl and has been added to the wait list.  He is seeing his PCP tomorrow and will have that office contact us if needed.

## 2015-11-19 NOTE — Telephone Encounter (Signed)
Left message on machine to call back  

## 2015-11-20 NOTE — Telephone Encounter (Signed)
Called and spoke to pt and he has not had any episodes since yesterday and if it reoccurs he will call us back for an appt.

## 2016-01-01 ENCOUNTER — Telehealth: Payer: Self-pay | Admitting: *Deleted

## 2016-01-01 MED ORDER — MECLIZINE HCL 25 MG PO TABS: 25.0000 mg | ORAL_TABLET | Freq: Three times a day (TID) | ORAL | 0 refills | Status: DC | PRN

## 2016-01-01 NOTE — Telephone Encounter (Signed)
Medication called/sent to requested pharmacy  

## 2016-01-01 NOTE — Telephone Encounter (Signed)
Patient called and states he needs a refill of his Meclizine. His pharmacy is [facility] in [place]. Thanks

## 2016-01-21 ENCOUNTER — Other Ambulatory Visit (INDEPENDENT_AMBULATORY_CARE_PROVIDER_SITE_OTHER): Payer: PPO

## 2016-01-21 ENCOUNTER — Ambulatory Visit (INDEPENDENT_AMBULATORY_CARE_PROVIDER_SITE_OTHER): Payer: PPO | Admitting: Internal Medicine

## 2016-01-21 ENCOUNTER — Telehealth: Payer: Self-pay | Admitting: Internal Medicine

## 2016-01-21 ENCOUNTER — Encounter: Payer: Self-pay | Admitting: Internal Medicine

## 2016-01-21 ENCOUNTER — Encounter (INDEPENDENT_AMBULATORY_CARE_PROVIDER_SITE_OTHER): Payer: Self-pay

## 2016-01-21 VITALS — BP 150/80 | HR 64 | Ht 68.25 in | Wt 228.0 lb

## 2016-01-21 DIAGNOSIS — R1012 Left upper quadrant pain: Secondary | ICD-10-CM

## 2016-01-21 DIAGNOSIS — G8929 Other chronic pain: Secondary | ICD-10-CM

## 2016-01-21 DIAGNOSIS — K921 Melena: Secondary | ICD-10-CM

## 2016-01-21 DIAGNOSIS — K648 Other hemorrhoids: Secondary | ICD-10-CM

## 2016-01-21 DIAGNOSIS — K641 Second degree hemorrhoids: Secondary | ICD-10-CM

## 2016-01-21 LAB — CBC WITH DIFFERENTIAL/PLATELET
Basophils Absolute: 0.1 10*3/uL (ref 0.0–0.1)
Basophils Relative: 0.8 % (ref 0.0–3.0)
Eosinophils Absolute: 0.3 10*3/uL (ref 0.0–0.7)
Eosinophils Relative: 3.4 % (ref 0.0–5.0)
HCT: 44.8 % (ref 39.0–52.0)
Hemoglobin: 15.6 g/dL (ref 13.0–17.0)
Lymphocytes Relative: 23.1 % (ref 12.0–46.0)
Lymphs Abs: 1.7 10*3/uL (ref 0.7–4.0)
MCHC: 34.7 g/dL (ref 30.0–36.0)
MCV: 94.1 fl (ref 78.0–100.0)
Monocytes Absolute: 0.6 10*3/uL (ref 0.1–1.0)
Monocytes Relative: 7.6 % (ref 3.0–12.0)
Neutro Abs: 4.9 10*3/uL (ref 1.4–7.7)
Neutrophils Relative %: 65.1 % (ref 43.0–77.0)
Platelets: 192 10*3/uL (ref 150.0–400.0)
RBC: 4.76 Mil/uL (ref 4.22–5.81)
RDW: 13 % (ref 11.5–15.5)
WBC: 7.5 10*3/uL (ref 4.0–10.5)

## 2016-01-21 MED ORDER — DICYCLOMINE HCL 20 MG PO TABS: 20.0000 mg | ORAL_TABLET | Freq: Three times a day (TID) | ORAL | 11 refills | Status: DC

## 2016-01-21 NOTE — Telephone Encounter (Signed)
See follow up. 

## 2016-01-21 NOTE — Patient Instructions (Signed)
  Your physician has requested that you go to the basement for the following lab work before leaving today: CBC/diff    I appreciate the opportunity to care for you. Silvano Rusk, MD, [facility]

## 2016-01-21 NOTE — Progress Notes (Signed)
   Subjective:     Assessment & Plan:   Encounter Diagnoses  Name Primary?  . Hematochezia Yes  . Chronic LUQ pain   . Prolapsed internal hemorrhoids, grade 2    Sounds like a low-grade short-lived diverticular bleed vs bleeding hemorrhoids Resolved  His chronic LUQ pain is likely symptomatic diverticulosis   Plans:  Meds:  Change dicyclomine to 20 mg tid/hs Studies: CBC  RTC prn further bleeding     Patient ID: Brendan Hopkins, male    DOB: [birth year], 80 y.o.   MRN: [id number] Cc: rectal bleeding HPI A few months ago had 2 episodes in one day of passing red blood w/ pain or other sxs.Does have a chronic low grade LLQ pain w/o change - prior w/u colonoscopy, CT unrevealinmg Has not recurred. Having Reg defecation.   Medications, allergies, past medical history, past surgical history, family history and social history are reviewed and updated in the EMR.  Review of Systems As above    Objective:   Physical Exam BP (!) 150/80 (BP Location: Left Arm, Patient Position: Sitting, Cuff Size: Normal)   Pulse 64   Ht 5' 8.25" (1.734 m) Comment: height measured without shoes  Wt 228 lb (103.4 kg)   BMI 34.41 kg/m  NAD Bd soft and NT BS+ Rectal - NL anoderm - NL resting tone, brown stool and no mass Anoscopy: - gr 2 inflamed internal hemorrhoids all positions

## 2016-01-22 NOTE — Progress Notes (Signed)
Blood count is fine - normal - no new recommendations

## 2016-01-24 ENCOUNTER — Encounter: Payer: Self-pay | Admitting: Internal Medicine

## 2016-02-09 ENCOUNTER — Telehealth: Payer: Self-pay | Admitting: Family Medicine

## 2016-02-09 NOTE — Telephone Encounter (Signed)
Received fax from [facility] back Auth# [id number] Treating Provider Dr. Silvano Rusk # of Visits 6 Start Date 01/21/2016 End Date 07/19/2016 CPT 99499 DX K92.1

## 2016-03-04 ENCOUNTER — Ambulatory Visit (INDEPENDENT_AMBULATORY_CARE_PROVIDER_SITE_OTHER): Payer: PPO

## 2016-03-04 DIAGNOSIS — Z23 Encounter for immunization: Secondary | ICD-10-CM

## 2016-03-09 ENCOUNTER — Other Ambulatory Visit: Payer: Self-pay | Admitting: Family Medicine

## 2016-03-16 ENCOUNTER — Other Ambulatory Visit: Payer: Self-pay | Admitting: Family Medicine

## 2016-03-18 ENCOUNTER — Ambulatory Visit (INDEPENDENT_AMBULATORY_CARE_PROVIDER_SITE_OTHER): Payer: PPO | Admitting: Physician Assistant

## 2016-03-18 VITALS — BP 146/82 | HR 66 | Temp 98.3°F | Resp 16 | Wt 229.0 lb

## 2016-03-18 DIAGNOSIS — H6123 Impacted cerumen, bilateral: Secondary | ICD-10-CM

## 2016-03-18 NOTE — Progress Notes (Signed)
    Patient ID: Brendan Hopkins MRN: [id number], DOB: February 21, 1936, 80 y.o. Date of Encounter: 03/18/2016, 2:26 PM    Chief Complaint:  Chief Complaint  Patient presents with  . check ears     HPI: 80 y.o. year old male presents with above.   Patient states that he usually has to come get his ears cleaned out about once a year. Wondering if they have gotten clogged up again and need to be cleaned again.  No other complaints or concerns.     Home Meds:   Outpatient Medications Prior to Visit  Medication Sig Dispense Refill  . amLODipine (NORVASC) 5 MG tablet TAKE ONE TABLET BY MOUTH ONCE DAILY 30 tablet 5  . aspirin EC 81 MG tablet Take 81 mg by mouth daily.    Marland Kitchen dicyclomine (BENTYL) 20 MG tablet Take 1 tablet (20 mg total) by mouth 4 (four) times daily -  before meals and at bedtime. 120 tablet 11  . fish oil-omega-3 fatty acids 1000 MG capsule Take 1 g by mouth daily.    Marland Kitchen HYDROcodone-acetaminophen (NORCO/VICODIN) 5-325 MG per tablet Take 2 tablets by mouth every 4 (four) hours as needed. 10 tablet 0  . ibuprofen (ADVIL,MOTRIN) 100 MG/5ML suspension Take 200 mg by mouth every 4 (four) hours as needed.    . meclizine (ANTIVERT) 25 MG tablet Take 1 tablet (25 mg total) by mouth 3 (three) times daily as needed. 30 tablet 0  . Multiple Vitamin (MULTIVITAMIN WITH MINERALS) TABS Take 1 tablet by mouth daily.    . pantoprazole (PROTONIX) 40 MG tablet TAKE ONE TABLET BY MOUTH ONCE DAILY 30 tablet 3   No facility-administered medications prior to visit.     Allergies: No Known Allergies    Review of Systems: See HPI for pertinent ROS. All other ROS negative.    Physical Exam: Blood pressure (!) 146/82, pulse 66, temperature 98.3 F (36.8 C), resp. rate 16, weight 229 lb (103.9 kg)., Body mass index is 34.56 kg/m. General:  WNWD WM. Appears in no acute distress. HEENT:  Right Ear Canal is 2/3 obstructed with cerumen.  Left Ear Canal is completed obstructed with cerumen.    Neck: Supple. No thyromegaly. No lymphadenopathy. Lungs: Clear bilaterally to auscultation without wheezes, rales, or rhonchi. Breathing is unlabored. Heart: Regular rhythm. No murmurs, rubs, or gallops. Msk:  Strength and tone normal for age. Extremities/Skin: Warm and dry.  Neuro: Alert and oriented X 3. Moves all extremities spontaneously. Gait is normal. CNII-XII grossly in tact. Psych:  Responds to questions appropriately with a normal affect.     ASSESSMENT AND PLAN:  80 y.o. year old male with  1. Bilateral impacted cerumen Both ears are irrigated. To use otc drops to prevent re-occurrence   Signed, Olean Ree Arnold, [place], [facility] 03/18/2016 2:26 PM

## 2016-04-27 ENCOUNTER — Telehealth: Payer: Self-pay | Admitting: Family Medicine

## 2016-04-27 NOTE — Telephone Encounter (Signed)
Apolonio Schneiders # [id number] for 6 visits with Dr Carlean Purl, [place], 01/21/16 -07/19/16

## 2016-05-06 ENCOUNTER — Encounter: Payer: Self-pay | Admitting: Family Medicine

## 2016-05-06 ENCOUNTER — Ambulatory Visit (INDEPENDENT_AMBULATORY_CARE_PROVIDER_SITE_OTHER): Payer: PPO | Admitting: Family Medicine

## 2016-05-06 VITALS — BP 140/82 | HR 72 | Temp 98.1°F | Resp 20 | Ht 71.0 in | Wt 232.0 lb

## 2016-05-06 DIAGNOSIS — R0602 Shortness of breath: Secondary | ICD-10-CM

## 2016-05-06 MED ORDER — AMOXICILLIN 875 MG PO TABS: 875.0000 mg | ORAL_TABLET | Freq: Two times a day (BID) | ORAL | 0 refills | Status: DC

## 2016-05-07 LAB — COMPLETE METABOLIC PANEL WITH GFR
ALT: 15 U/L (ref 9–46)
AST: 21 U/L (ref 10–35)
Albumin: 4.4 g/dL (ref 3.6–5.1)
Alkaline Phosphatase: 66 U/L (ref 40–115)
BILIRUBIN TOTAL: 0.7 mg/dL (ref 0.2–1.2)
BUN: 23 mg/dL (ref 7–25)
CHLORIDE: 105 mmol/L (ref 98–110)
CO2: 23 mmol/L (ref 20–31)
Calcium: 9.3 mg/dL (ref 8.6–10.3)
Creat: 1.15 mg/dL (ref 0.70–1.18)
GFR, EST AFRICAN AMERICAN: 70 mL/min (ref >=60)
GFR, EST NON AFRICAN AMERICAN: 61 mL/min (ref >=60)
Glucose, Bld: 114 mg/dL — ABNORMAL HIGH (ref 70–99)
POTASSIUM: 4.1 mmol/L (ref 3.5–5.3)
Sodium: 139 mmol/L (ref 135–146)
TOTAL PROTEIN: 6.9 g/dL (ref 6.1–8.1)

## 2016-05-07 LAB — CBC WITH DIFFERENTIAL/PLATELET
BASOS ABS: 0 {cells}/uL (ref 0–200)
Basophils Relative: 0 %
EOS ABS: 312 {cells}/uL (ref 15–500)
EOS PCT: 4 %
HCT: 44.2 % (ref 38.5–50.0)
Hemoglobin: 15.5 g/dL (ref 13.0–17.0)
LYMPHS PCT: 25 %
Lymphs Abs: 1950 cells/uL (ref 850–3900)
MCH: 32.9 pg (ref 27.0–33.0)
MCHC: 35.1 g/dL (ref 32.0–36.0)
MCV: 93.8 fL (ref 80.0–100.0)
MONOS PCT: 7 %
MPV: 10.2 fL (ref 7.5–12.5)
Monocytes Absolute: 546 cells/uL (ref 200–950)
Neutro Abs: 4992 cells/uL (ref 1500–7800)
Neutrophils Relative %: 64 %
PLATELETS: 212 10*3/uL (ref 140–400)
RBC: 4.71 MIL/uL (ref 4.20–5.80)
RDW: 13.5 % (ref 11.0–15.0)
WBC: 7.8 10*3/uL (ref 3.8–10.8)

## 2016-05-07 LAB — BRAIN NATRIURETIC PEPTIDE: Brain Natriuretic Peptide: 55.7 pg/mL (ref <100)

## 2016-05-07 NOTE — Progress Notes (Signed)
Subjective:    Patient ID: Brendan Hopkins, male    DOB: 1936-02-04, 80 y.o.   MRN: [id number]  HPI Patient is a very pleasant 80 year old white male. 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 Therefore he is not as physically active as he once was. She also is losing weight. She will eat [employer]. Therefore they go there almost every day or [employer] caf[employer]. As a result he has been eating fast food on a regular basis. As a result he has gained a proximally 10 pounds over the last year. He is now concerned because whenever he bends over to put on his shoes he is very short of breath. If he tries to walk long distances he becomes very short of breath. He denies any chest pain or angina. He denies any orthopnea. He does have some peripheral edema but this is chronic and mild. He denies any palpitations or syncope. His other primary concern are some tender nodules that he is palpated on the left side of his neck. It is difficult to feel them below I believe he is feeling are reactive lymph nodes. They're approximately 1 cm in diameter. They are not hard or calcified. At best I can palpate one or possibly 2 on the left side of his neck anterior to the sternocleidomastoid muscle. He has recently had a "sinus infection" for the last 4 weeks coupled with drainage rhinorrhea headaches and sore throat Past Medical History:  Diagnosis Date  . Arthritis    RIGHT HIP, HANDS, BACK  . Cataract   . Colon polyps    2005  . COPD (chronic obstructive pulmonary disease) ([place])    SMOKED FOR 19 YRS  . Diverticulosis    2005  . Fatty liver   . GERD (gastroesophageal reflux disease)   . Hypertension   . Pain    RIGHT HIP AND BACK  . Personal history of colonic polyps-adenoma and sessile serrated polyp 07/15/2010  . RBBB    POSS HX AF IN PAST- PAC'S  . Shortness of breath    WITH EXERTION ONLY  . Stroke ([place])    FEW YEARS AGO - PT SAYS "HEAT STROKE" X 2 -  BUT NO PHYSICAL DEFICITS  . TIA (transient ischemic attack)    Past Surgical History:  Procedure Laterality Date  . COLONOSCOPY    . TOTAL HIP ARTHROPLASTY Right 01/09/2013   Procedure: RIGHT TOTAL HIP ARTHROPLASTY ANTERIOR APPROACH;  Surgeon: [name], MD;  Location: [facility];  Service: Orthopedics;  Laterality: Right;   Current Outpatient Prescriptions on File Prior to Visit  Medication Sig Dispense Refill  . amLODipine (NORVASC) 5 MG tablet TAKE ONE TABLET BY MOUTH ONCE DAILY 30 tablet 5  . aspirin EC 81 MG tablet Take 81 mg by mouth daily.    Marland Kitchen dicyclomine (BENTYL) 20 MG tablet Take 1 tablet (20 mg total) by mouth 4 (four) times daily -  before meals and at bedtime. 120 tablet 11  . fish oil-omega-3 fatty acids 1000 MG capsule Take 1 g by mouth daily.    Marland Kitchen ibuprofen (ADVIL,MOTRIN) 100 MG/5ML suspension Take 200 mg by mouth every 4 (four) hours as needed.    . meclizine (ANTIVERT) 25 MG tablet Take 1 tablet (25 mg total) by mouth 3 (three) times daily as needed. 30 tablet 0  . Multiple Vitamin (MULTIVITAMIN WITH MINERALS) TABS Take 1 tablet by mouth daily.    . pantoprazole (  PROTONIX) 40 MG tablet TAKE ONE TABLET BY MOUTH ONCE DAILY 30 tablet 3   No current facility-administered medications on file prior to visit.    No Known Allergies Social History   Social History  . Marital status: Married    Spouse name: N/A  . Number of children: N/A  . Years of education: N/A   Occupational History  . Retired    Social History Main Topics  . Smoking status: Former Smoker    Years: 57.00    Types: Cigarettes    Quit date: 05/25/2007  . Smokeless tobacco: Never Used  . Alcohol use No  . Drug use: No  . Sexual activity: Not on file   Other Topics Concern  . 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      Objective:   Physical Exam    Constitutional: He appears well-developed and well-nourished. No distress.  Eyes: Conjunctivae are normal.  Neck: Neck supple. No JVD present. No tracheal deviation present. No thyromegaly present.  Cardiovascular: Normal rate, regular rhythm and normal heart sounds.   No murmur heard. Pulmonary/Chest: Effort normal and breath sounds normal. No stridor. No respiratory distress. He has no wheezes. He has no rales.  Abdominal: Soft. Bowel sounds are normal. He exhibits no distension and no mass. There is no tenderness. There is no rebound and no guarding.  Musculoskeletal: He exhibits edema.  Lymphadenopathy:    He has cervical adenopathy.  Skin: He is not diaphoretic.  Vitals reviewed.         Assessment & Plan:  SOB (shortness of breath) on exertion - Plan: EKG 12-Lead, CBC with Differential/Platelet, COMPLETE METABOLIC PANEL WITH GFR, Brain natriuretic peptide, DG Chest 2 View  The lymph nodes do not feel pathologic. I believe they're likely reactive to his sinus infection. Given duration of symptoms, I will check a CBC. Also treat the patient with amoxicillin 875 mg by mouth twice a day for 10 days. Reassess if lymph nodes persist more than 3 weeks. Ellyn Hack his shortness of breath with exertion. I believe this is more likely deconditioning. His weight gain does not appear to be fluid overload. Instead I believe this is due to their dietary changes and his lack of exercise. EKG today does show a chronic right bundle branch block with chronic Q waves in inferior leads but this has not changed since 2014 when compared to old EKGs. Therefore there is no obvious change in his EKG. I'll check a CBC to rule out anemia. I will check a BNP given his edema. Also check a chest x-ray given his history of smoking. I have asked the patient to gradually begin increasing his exercise to see if this is deconditioning. Given his age, and shortness of breath worsens, I would recommend cardiology evaluation as  the likelihood of coronary artery calcifications are very high. EKG at first class appears his atrial fibrillation. However there are small definite P waves. The abnormality seen appear to be occasional PACs.

## 2016-05-13 ENCOUNTER — Ambulatory Visit
Admission: RE | Admit: 2016-05-13 | Discharge: 2016-05-13 | Disposition: A | Discharge status: Home / Self Care | Payer: PPO | Source: Ambulatory Visit | Attending: Family Medicine | Admitting: Family Medicine

## 2016-05-13 DIAGNOSIS — R0602 Shortness of breath: Secondary | ICD-10-CM

## 2016-07-10 ENCOUNTER — Other Ambulatory Visit: Payer: Self-pay | Admitting: Family Medicine

## 2016-09-18 ENCOUNTER — Other Ambulatory Visit: Payer: Self-pay | Admitting: Family Medicine

## 2016-12-14 ENCOUNTER — Encounter: Payer: Self-pay | Admitting: Family Medicine

## 2016-12-14 ENCOUNTER — Ambulatory Visit (INDEPENDENT_AMBULATORY_CARE_PROVIDER_SITE_OTHER): Payer: PPO | Admitting: Family Medicine

## 2016-12-14 VITALS — BP 126/80 | HR 82 | Temp 98.1°F | Resp 18 | Ht 71.0 in | Wt 229.0 lb

## 2016-12-14 DIAGNOSIS — I10 Essential (primary) hypertension: Secondary | ICD-10-CM

## 2016-12-14 DIAGNOSIS — Z125 Encounter for screening for malignant neoplasm of prostate: Secondary | ICD-10-CM | POA: Diagnosis not present

## 2016-12-14 DIAGNOSIS — Z0000 Encounter for general adult medical examination without abnormal findings: Secondary | ICD-10-CM | POA: Diagnosis not present

## 2016-12-14 LAB — LIPID PANEL
Cholesterol: 185 mg/dL (ref <200)
HDL: 56 mg/dL (ref >40)
LDL Cholesterol: 115 mg/dL — ABNORMAL HIGH (ref <100)
Total CHOL/HDL Ratio: 3.3 Ratio (ref <5.0)
Triglycerides: 70 mg/dL (ref <150)
VLDL: 14 mg/dL (ref <30)

## 2016-12-14 LAB — CBC WITH DIFFERENTIAL/PLATELET
BASOS ABS: 83 {cells}/uL (ref 0–200)
Basophils Relative: 1 %
EOS ABS: 249 {cells}/uL (ref 15–500)
Eosinophils Relative: 3 %
HCT: 46.9 % (ref 38.5–50.0)
HEMOGLOBIN: 16 g/dL (ref 13.0–17.0)
LYMPHS ABS: 1909 {cells}/uL (ref 850–3900)
Lymphocytes Relative: 23 %
MCH: 32.7 pg (ref 27.0–33.0)
MCHC: 34.1 g/dL (ref 32.0–36.0)
MCV: 95.7 fL (ref 80.0–100.0)
MONO ABS: 498 {cells}/uL (ref 200–950)
MPV: 10.2 fL (ref 7.5–12.5)
Monocytes Relative: 6 %
NEUTROS ABS: 5561 {cells}/uL (ref 1500–7800)
Neutrophils Relative %: 67 %
Platelets: 208 10*3/uL (ref 140–400)
RBC: 4.9 MIL/uL (ref 4.20–5.80)
RDW: 13.5 % (ref 11.0–15.0)
WBC: 8.3 10*3/uL (ref 3.8–10.8)

## 2016-12-14 LAB — COMPLETE METABOLIC PANEL WITH GFR
ALBUMIN: 4.5 g/dL (ref 3.6–5.1)
ALK PHOS: 77 U/L (ref 40–115)
ALT: 16 U/L (ref 9–46)
AST: 20 U/L (ref 10–35)
BILIRUBIN TOTAL: 0.9 mg/dL (ref 0.2–1.2)
BUN: 21 mg/dL (ref 7–25)
CO2: 20 mmol/L (ref 20–31)
CREATININE: 1.04 mg/dL (ref 0.70–1.18)
Calcium: 9.4 mg/dL (ref 8.6–10.3)
Chloride: 105 mmol/L (ref 98–110)
GFR, EST AFRICAN AMERICAN: 79 mL/min (ref >=60)
GFR, EST NON AFRICAN AMERICAN: 68 mL/min (ref >=60)
GLUCOSE: 98 mg/dL (ref 70–99)
Potassium: 4.5 mmol/L (ref 3.5–5.3)
SODIUM: 139 mmol/L (ref 135–146)
TOTAL PROTEIN: 7.2 g/dL (ref 6.1–8.1)

## 2016-12-14 MED ORDER — HYOSCYAMINE SULFATE 0.125 MG PO TABS: 0.1250 mg | ORAL_TABLET | ORAL | 0 refills | Status: DC | PRN

## 2016-12-14 NOTE — Progress Notes (Signed)
Subjective:    Patient ID: Brendan Hopkins, male    DOB: 1935-09-17, 81 y.o.   MRN: [id number]  HPI Patient is here today for complete physical exam. He denies any concerns. Unfortunately, since I last saw him, his wife died from Alzheimer's disease.  He is doing remarkably well.  His only concern is llq abd pain that has been present for years and has been treated by Dr. Carlean Purl with bentyl.  He would like to try something else as bentyl is not beneficial.  Last colonosocpy was 2015 and is due again in 2020.  He is due for a PSA today. Immunizations are up-to-date except for the shingles vaccine and the tetanus shot. He declines the shingles vaccine and a tetanus shot today. He can return if he suffers a cut or puncture wound and I'll be glad given the tetanus shot at that time. The remainder of his preventative care is up-to-date Past Medical History:  Diagnosis Date  . Arthritis    RIGHT HIP, HANDS, BACK  . Cataract   . Colon polyps    2005  . COPD (chronic obstructive pulmonary disease) ([place])    SMOKED FOR 35 YRS  . Diverticulosis    2005  . Fatty liver   . GERD (gastroesophageal reflux disease)   . Hypertension   . Pain    RIGHT HIP AND BACK  . Personal history of colonic polyps-adenoma and sessile serrated polyp 07/15/2010  . RBBB    POSS HX AF IN PAST- PAC'S  . Shortness of breath    WITH EXERTION ONLY  . Stroke ([place])    FEW YEARS AGO - PT SAYS "HEAT STROKE" X 2 - BUT NO PHYSICAL DEFICITS  . TIA (transient ischemic attack)    Past Surgical History:  Procedure Laterality Date  . COLONOSCOPY    . TOTAL HIP ARTHROPLASTY Right 01/09/2013   Procedure: RIGHT TOTAL HIP ARTHROPLASTY ANTERIOR APPROACH;  Surgeon: [name], MD;  Location: [facility];  Service: Orthopedics;  Laterality: Right;   Current Outpatient Prescriptions on File Prior to Visit  Medication Sig Dispense Refill  . amLODipine (NORVASC) 5 MG tablet TAKE ONE TABLET BY MOUTH ONCE DAILY 30 tablet 5  . aspirin  EC 81 MG tablet Take 81 mg by mouth daily.    Marland Kitchen dicyclomine (BENTYL) 20 MG tablet Take 1 tablet (20 mg total) by mouth 4 (four) times daily -  before meals and at bedtime. 120 tablet 11  . fish oil-omega-3 fatty acids 1000 MG capsule Take 1 g by mouth daily.    Marland Kitchen ibuprofen (ADVIL,MOTRIN) 100 MG/5ML suspension Take 200 mg by mouth every 4 (four) hours as needed.    . meclizine (ANTIVERT) 25 MG tablet TAKE ONE TABLET BY MOUTH THREE TIMES DAILY AS NEEDED (Patient taking differently: TAKE ONE TABLET BY MOUTH THREE TIMES DAILY AS NEEDED FOR DIZZINESS) 30 tablet 0  . Multiple Vitamin (MULTIVITAMIN WITH MINERALS) TABS Take 1 tablet by mouth daily.    . pantoprazole (PROTONIX) 40 MG tablet TAKE ONE TABLET BY MOUTH ONCE DAILY 30 tablet 3   No current facility-administered medications on file prior to visit.    No Known Allergies Social History   Social History  . Marital status: Married    Spouse name: N/A  . Number of children: N/A  . Years of education: N/A   Occupational History  . Retired    Social History Main Topics  . Smoking status: Former Smoker    Years: 57.00  Types: Cigarettes    Quit date: 05/25/2007  . Smokeless tobacco: Never Used  . Alcohol use No  . Drug use: No  . Sexual activity: Not on file   Other Topics Concern  . 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 Cancer Father   . Cancer Mother        died more of old age at age 51  . Colon cancer Neg Hx   . Esophageal cancer Neg Hx   . Prostate cancer Neg Hx   . Rectal cancer Neg Hx   . Stomach cancer Neg Hx       Review of Systems  All other systems reviewed and are negative.      Objective:   Physical Exam  Constitutional: He is oriented to person, place, and time. He appears well-developed and well-nourished. No distress.  HENT:  Head: Normocephalic and atraumatic.  Right Ear:  External ear normal.  Left Ear: External ear normal.  Nose: Nose normal.  Mouth/Throat: Oropharynx is clear and moist. No oropharyngeal exudate.  Eyes: Pupils are equal, round, and reactive to light. Conjunctivae and EOM are normal. Right eye exhibits no discharge. Left eye exhibits no discharge. No scleral icterus.  Neck: Normal range of motion. Neck supple. No JVD present. No tracheal deviation present. No thyromegaly present.  Cardiovascular: Normal rate, regular rhythm, normal heart sounds and intact distal pulses.  Exam reveals no gallop and no friction rub.   No murmur heard. Pulmonary/Chest: Effort normal and breath sounds normal. No stridor. No respiratory distress. He has no wheezes. He has no rales. He exhibits no tenderness.  Abdominal: Soft. Bowel sounds are normal. He exhibits no distension and no mass. There is no tenderness. There is no rebound and no guarding.  Musculoskeletal: Normal range of motion. He exhibits no edema or tenderness.  Lymphadenopathy:    He has no cervical adenopathy.  Neurological: He is alert and oriented to person, place, and time. He has normal reflexes. No cranial nerve deficit. He exhibits normal muscle tone. Coordination normal.  Skin: Skin is warm. No rash noted. He is not diaphoretic. No erythema. No pallor.  Psychiatric: He has a normal mood and affect. His behavior is normal. Judgment and thought content normal.  Vitals reviewed.         Assessment & Plan:  Benign essential HTN - Plan: CBC with Differential/Platelet, COMPLETE METABOLIC PANEL WITH GFR, Lipid panel  Prostate cancer screening - Plan: PSA  Routine general medical examination at a health care facility  Patient's physical exam is completely normal. Colonoscopy is up-to-date. Immunizations are up-to-date. I did recommend the shingles shot. He should check on the price first. He can return at any time to get that if he wants it. Likewise he can return at any point for the tetanus  shot. I will check a PSA to screen for prostate cancer. Blood pressures well controlled on medication. I will check a CBC, CMP, fasting lipid panel. Goal LDL cholesterol is less than 130. On his back, there are several sebaceous cyst. 3 a very small. 2 are moderate in size about 2 cm each. He also has a 2 x 3 cm elliptical erythematous scaly hard patch on his right flank which is concerning for skin cancer. I recommended an excisional biopsy of this. He can return at that point for that procedure  and we can also remove the 2 bigger cysts.  He will schedule that.

## 2016-12-15 LAB — PSA: PSA: 0.9 ng/mL (ref <=4.0)

## 2016-12-20 ENCOUNTER — Ambulatory Visit: Payer: PPO | Admitting: Family Medicine

## 2016-12-21 ENCOUNTER — Ambulatory Visit (INDEPENDENT_AMBULATORY_CARE_PROVIDER_SITE_OTHER): Payer: PPO | Admitting: Family Medicine

## 2016-12-21 VITALS — BP 140/70 | HR 68 | Temp 98.3°F | Resp 18 | Ht 71.0 in | Wt 229.0 lb

## 2016-12-21 DIAGNOSIS — D485 Neoplasm of uncertain behavior of skin: Secondary | ICD-10-CM | POA: Diagnosis not present

## 2016-12-21 DIAGNOSIS — C4491 Basal cell carcinoma of skin, unspecified: Secondary | ICD-10-CM | POA: Diagnosis not present

## 2016-12-21 NOTE — Addendum Note (Signed)
Addended by: Shary Decamp B on: 12/21/2016 03:02 PM   Modules accepted: Orders

## 2016-12-21 NOTE — Progress Notes (Signed)
Subjective:    Patient ID: Brendan Hopkins, male    DOB: 03-Mar-1936, 81 y.o.   MRN: [id number]  HPI  Patient has a suspicious lesion on his right flank at approximately the level of T12. It is a 2 cm erythematous scaly patch with telangiectasias and fissuring but I am concerned may be a squamous cell carcinoma. I recommended an excisional biopsy of this. He is here today for that procedure Past Medical History:  Diagnosis Date  . Arthritis    RIGHT HIP, HANDS, BACK  . Cataract   . Colon polyps    2005  . COPD (chronic obstructive pulmonary disease) ([place])    SMOKED FOR 70 YRS  . Diverticulosis    2005  . Fatty liver   . GERD (gastroesophageal reflux disease)   . Hypertension   . Pain    RIGHT HIP AND BACK  . Personal history of colonic polyps-adenoma and sessile serrated polyp 07/15/2010  . RBBB    POSS HX AF IN PAST- PAC'S  . Shortness of breath    WITH EXERTION ONLY  . Stroke ([place])    FEW YEARS AGO - PT SAYS "HEAT STROKE" X 2 - BUT NO PHYSICAL DEFICITS  . TIA (transient ischemic attack)    Past Surgical History:  Procedure Laterality Date  . COLONOSCOPY    . TOTAL HIP ARTHROPLASTY Right 01/09/2013   Procedure: RIGHT TOTAL HIP ARTHROPLASTY ANTERIOR APPROACH;  Surgeon: [name], MD;  Location: [facility];  Service: Orthopedics;  Laterality: Right;   Current Outpatient Prescriptions on File Prior to Visit  Medication Sig Dispense Refill  . amLODipine (NORVASC) 5 MG tablet TAKE ONE TABLET BY MOUTH ONCE DAILY 30 tablet 5  . aspirin EC 81 MG tablet Take 81 mg by mouth daily.    Marland Kitchen dicyclomine (BENTYL) 20 MG tablet Take 1 tablet (20 mg total) by mouth 4 (four) times daily -  before meals and at bedtime. 120 tablet 11  . fish oil-omega-3 fatty acids 1000 MG capsule Take 1 g by mouth daily.    . hyoscyamine (LEVSIN) 0.125 MG tablet Take 1 tablet (0.125 mg total) by mouth every 4 (four) hours as needed. 30 tablet 0  . ibuprofen (ADVIL,MOTRIN) 100 MG/5ML suspension Take 200 mg  by mouth every 4 (four) hours as needed.    . meclizine (ANTIVERT) 25 MG tablet TAKE ONE TABLET BY MOUTH THREE TIMES DAILY AS NEEDED (Patient taking differently: TAKE ONE TABLET BY MOUTH THREE TIMES DAILY AS NEEDED FOR DIZZINESS) 30 tablet 0  . Multiple Vitamin (MULTIVITAMIN WITH MINERALS) TABS Take 1 tablet by mouth daily.    . pantoprazole (PROTONIX) 40 MG tablet TAKE ONE TABLET BY MOUTH ONCE DAILY 30 tablet 3   No current facility-administered medications on file prior to visit.    No Known Allergies Social History   Social History  . Marital status: Married    Spouse name: N/A  . Number of children: N/A  . Years of education: N/A   Occupational History  . Retired    Social History Main Topics  . Smoking status: Former Smoker    Years: 57.00    Types: Cigarettes    Quit date: 05/25/2007  . Smokeless tobacco: Never Used  . Alcohol use No  . Drug use: No  . Sexual activity: Not on file   Other Topics Concern  . 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      Objective:   Physical Exam  Cardiovascular: Normal rate, regular rhythm and normal heart sounds.   Pulmonary/Chest: Effort normal and breath sounds normal. No respiratory distress. He has no wheezes. He has no rales.  Vitals reviewed.   See HPI      Assessment & Plan:  Neoplasm of uncertain behavior of skin  There was anesthetized with 0.1% lidocaine without epinephrine. A 2 cm x 3 cm elliptical excision was performed horizontally around the lesion down to the subcutaneous fascia. The lesion was sent to pathology in a labeled container. Procedure was performed under sterile conditions after the patient was prepped and draped in a sterile fashion. The wound edges were then approximated using 5 simple interrupted 3-0 Ethilon sutures. It was minimal blood loss. The patient tolerated  the procedure well. Return for suture removal in 7-10 days

## 2016-12-22 ENCOUNTER — Other Ambulatory Visit: Payer: Self-pay | Admitting: Family Medicine

## 2016-12-23 ENCOUNTER — Telehealth: Payer: Self-pay | Admitting: Family Medicine

## 2016-12-23 NOTE — Telephone Encounter (Signed)
Called pt and left detailed message on vm

## 2016-12-23 NOTE — Telephone Encounter (Signed)
Pt called about medication hyoscyamine, he cannot afford it, is there anything else similar with lower cost that we can prescribe?

## 2016-12-23 NOTE — Telephone Encounter (Signed)
He can come try samples of the donnatal elixir 1 tsp po q 6 hrs prn we have, I put on your desk.

## 2016-12-24 LAB — PATHOLOGY

## 2016-12-27 ENCOUNTER — Telehealth: Payer: Self-pay | Admitting: Family Medicine

## 2016-12-27 NOTE — Telephone Encounter (Signed)
Pt came in and got samples and understands how to take the medication.

## 2016-12-27 NOTE — Telephone Encounter (Signed)
Brendan Hopkins called saying the samples we had given to him were expired, he is going to take them still and see if they work if not he

## 2016-12-28 NOTE — Telephone Encounter (Signed)
I still think they are good to try to see if med will help before he buys it.

## 2016-12-28 NOTE — Telephone Encounter (Signed)
Pt is going to take them and let us know how they work

## 2016-12-30 ENCOUNTER — Encounter: Payer: Self-pay | Admitting: Family Medicine

## 2016-12-30 ENCOUNTER — Ambulatory Visit (INDEPENDENT_AMBULATORY_CARE_PROVIDER_SITE_OTHER): Payer: PPO | Admitting: Family Medicine

## 2016-12-30 VITALS — BP 136/70 | HR 80 | Temp 98.2°F | Resp 18 | Ht 71.0 in | Wt 229.0 lb

## 2016-12-30 DIAGNOSIS — Z4802 Encounter for removal of sutures: Secondary | ICD-10-CM

## 2016-12-30 NOTE — Progress Notes (Signed)
Subjective:    Patient ID: Brendan Hopkins, male    DOB: 1936-02-24, 81 y.o.   MRN: [id number]  HPI 12/21/16 Patient has a suspicious lesion on his right flank at approximately the level of T12. It is a 2 cm erythematous scaly patch with telangiectasias and fissuring but I am concerned may be a squamous cell carcinoma. I recommended an excisional biopsy of this. He is here today for that procedure.  At that time, my plan was: There was anesthetized with 0.1% lidocaine without epinephrine. A 2 cm x 3 cm elliptical excision was performed horizontally around the lesion down to the subcutaneous fascia. The lesion was sent to pathology in a labeled container. Procedure was performed under sterile conditions after the patient was prepped and draped in a sterile fashion. The wound edges were then approximated using 5 simple interrupted 3-0 Ethilon sutures. It was minimal blood loss. The patient tolerated the procedure well. Return for suture removal in 7-10 days  12/30/16 Biopsy revealed squamous cell cancer however margins were clear. He is here today for suture removal. The wound itself is well healed. There is no evidence of cellulitis or erythema. All 5 sutures were removed without difficulty. Past Medical History:  Diagnosis Date  . Arthritis    RIGHT HIP, HANDS, BACK  . Cataract   . Colon polyps    2005  . COPD (chronic obstructive pulmonary disease) ([place])    SMOKED FOR 63 YRS  . Diverticulosis    2005  . Fatty liver   . GERD (gastroesophageal reflux disease)   . Hypertension   . Pain    RIGHT HIP AND BACK  . Personal history of colonic polyps-adenoma and sessile serrated polyp 07/15/2010  . RBBB    POSS HX AF IN PAST- PAC'S  . Shortness of breath    WITH EXERTION ONLY  . Stroke ([place])    FEW YEARS AGO - PT SAYS "HEAT STROKE" X 2 - BUT NO PHYSICAL DEFICITS  . TIA (transient ischemic attack)    Past Surgical History:  Procedure Laterality Date  . COLONOSCOPY    . TOTAL HIP  ARTHROPLASTY Right 01/09/2013   Procedure: RIGHT TOTAL HIP ARTHROPLASTY ANTERIOR APPROACH;  Surgeon: [name], MD;  Location: [facility];  Service: Orthopedics;  Laterality: Right;   Current Outpatient Prescriptions on File Prior to Visit  Medication Sig Dispense Refill  . amLODipine (NORVASC) 5 MG tablet TAKE ONE TABLET BY MOUTH ONCE DAILY 30 tablet 5  . aspirin EC 81 MG tablet Take 81 mg by mouth daily.    Marland Kitchen dicyclomine (BENTYL) 20 MG tablet Take 1 tablet (20 mg total) by mouth 4 (four) times daily -  before meals and at bedtime. 120 tablet 11  . fish oil-omega-3 fatty acids 1000 MG capsule Take 1 g by mouth daily.    . hyoscyamine (LEVSIN, ANASPAZ) 0.125 MG tablet TAKE 1 TABLET BY MOUTH EVERY 4 HOURS AS NEEDED 180 tablet 0  . ibuprofen (ADVIL,MOTRIN) 100 MG/5ML suspension Take 200 mg by mouth every 4 (four) hours as needed.    . meclizine (ANTIVERT) 25 MG tablet TAKE ONE TABLET BY MOUTH THREE TIMES DAILY AS NEEDED (Patient taking differently: TAKE ONE TABLET BY MOUTH THREE TIMES DAILY AS NEEDED FOR DIZZINESS) 30 tablet 0  . Multiple Vitamin (MULTIVITAMIN WITH MINERALS) TABS Take 1 tablet by mouth daily.    . pantoprazole (PROTONIX) 40 MG tablet TAKE ONE TABLET BY MOUTH ONCE DAILY 30 tablet 3   No current  facility-administered medications on file prior to visit.    No Known Allergies Social History   Social History  . Marital status: Married    Spouse name: N/A  . Number of children: N/A  . Years of education: N/A   Occupational History  . Retired    Social History Main Topics  . Smoking status: Former Smoker    Years: 57.00    Types: Cigarettes    Quit date: 05/25/2007  . Smokeless tobacco: Never Used  . Alcohol use No  . Drug use: No  . Sexual activity: Not on file   Other Topics Concern  . 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      Objective:   Physical Exam  Cardiovascular: Normal rate, regular rhythm and normal heart sounds.   Pulmonary/Chest: Effort normal and breath sounds normal. No respiratory distress. He has no wheezes. He has no rales.  Vitals reviewed.   See HPI      Assessment & Plan:  Visit for suture removal  Wound is well-healed with no evidence of secondary infection. Sutures removed without difficulty. Biopsy reveals cancer was removed with clear margins

## 2017-01-10 DIAGNOSIS — H2513 Age-related nuclear cataract, bilateral: Secondary | ICD-10-CM | POA: Diagnosis not present

## 2017-01-26 ENCOUNTER — Other Ambulatory Visit: Payer: Self-pay | Admitting: *Deleted

## 2017-01-26 MED ORDER — AMLODIPINE BESYLATE 5 MG PO TABS: 5.0000 mg | ORAL_TABLET | Freq: Every day | ORAL | 3 refills | Status: DC

## 2017-02-03 ENCOUNTER — Other Ambulatory Visit: Payer: Self-pay | Admitting: Internal Medicine

## 2017-02-03 DIAGNOSIS — R1012 Left upper quadrant pain: Principal | ICD-10-CM

## 2017-02-03 DIAGNOSIS — G8929 Other chronic pain: Secondary | ICD-10-CM

## 2017-02-03 NOTE — Telephone Encounter (Signed)
Tell him I read that he told Dr. Dennard Schaumann dicyclomine was not helping his LLQ pain  I suggest he try IB Gard 2 tablets twice a day to see if that helps

## 2017-02-03 NOTE — Telephone Encounter (Signed)
Spoke with Brendan Hopkins and he will go get the IBgard and try it for several weeks and let us know if it helps him or not. He wrote the name down.

## 2017-02-03 NOTE — Telephone Encounter (Signed)
Okay to refill Sir? 

## 2017-02-10 ENCOUNTER — Other Ambulatory Visit: Payer: Self-pay | Admitting: Family Medicine

## 2017-02-10 NOTE — Telephone Encounter (Signed)
Medication refilled per protocol. 

## 2017-03-02 ENCOUNTER — Ambulatory Visit (INDEPENDENT_AMBULATORY_CARE_PROVIDER_SITE_OTHER): Payer: PPO

## 2017-03-02 DIAGNOSIS — Z23 Encounter for immunization: Secondary | ICD-10-CM

## 2017-03-02 NOTE — Progress Notes (Signed)
Patient was seen in office for flu vaccine.patietn received vaccine in left deltoid. patient tolerated well

## 2017-03-03 ENCOUNTER — Ambulatory Visit: Payer: PPO | Admitting: Family Medicine

## 2017-05-26 ENCOUNTER — Ambulatory Visit (INDEPENDENT_AMBULATORY_CARE_PROVIDER_SITE_OTHER): Payer: Medicare HMO | Admitting: Family Medicine

## 2017-05-26 ENCOUNTER — Ambulatory Visit
Admission: RE | Admit: 2017-05-26 | Discharge: 2017-05-26 | Disposition: A | Discharge status: Home / Self Care | Payer: Medicare HMO | Source: Ambulatory Visit | Attending: Family Medicine | Admitting: Family Medicine

## 2017-05-26 ENCOUNTER — Encounter: Payer: Self-pay | Admitting: Family Medicine

## 2017-05-26 VITALS — BP 130/84 | HR 88 | Temp 98.2°F | Resp 18 | Ht 71.0 in | Wt 233.0 lb

## 2017-05-26 DIAGNOSIS — M549 Dorsalgia, unspecified: Secondary | ICD-10-CM | POA: Diagnosis not present

## 2017-05-26 DIAGNOSIS — M4184 Other forms of scoliosis, thoracic region: Secondary | ICD-10-CM | POA: Diagnosis not present

## 2017-05-26 DIAGNOSIS — M47814 Spondylosis without myelopathy or radiculopathy, thoracic region: Secondary | ICD-10-CM | POA: Diagnosis not present

## 2017-05-26 MED ORDER — CYCLOBENZAPRINE HCL 10 MG PO TABS: 10.0000 mg | ORAL_TABLET | Freq: Three times a day (TID) | ORAL | 0 refills | Status: DC | PRN

## 2017-05-26 MED ORDER — PREDNISONE 20 MG PO TABS: ORAL_TABLET | ORAL | 0 refills | Status: DC

## 2017-05-26 NOTE — Progress Notes (Signed)
Subjective:    Patient ID: Brendan Hopkins, male    DOB: [birth year], 82 y.o.   MRN: [id number]  Patient is a very pleasant 82 year old Caucasian male who presents with 4 weeks of pain in his mid back. The pain occurs at approximately the level of T3 just to the left of his spinal cord and just medial to his shoulder blade. The pain is described as an intense burning stabbing pain. It is made worse with forward abduction of his left arm and also when he bends over to pick up an object with his left arm. Any use of his extended left arm pulse pressure in that area and causes intense burning pain. He would like a referral to see Dr. Mina Marble at [facility] orthopedic.  He denies any specific injury that caused this problem. He denies any numbness or tingling in his left arm. He denies any chest pain or shortness of breath or hemoptysis. On examination today, there is no visible abnormality on the skin in the area where he reports pain. I am unable to reproduce the pain with palpation. There is no palpable abnormality in the musculature in his mid upper back to the left of the spine. His muscle strength in his left and right arm is equal and symmetric bilaterally and 5 over 5. There is no pain shooting down his back with axial load applied to the skull Past Medical History:  Diagnosis Date  . Arthritis    RIGHT HIP, HANDS, BACK  . Cataract   . Colon polyps    2005  . COPD (chronic obstructive pulmonary disease) ([place])    SMOKED FOR 14 YRS  . Diverticulosis    2005  . Fatty liver   . GERD (gastroesophageal reflux disease)   . Hypertension   . Pain    RIGHT HIP AND BACK  . Personal history of colonic polyps-adenoma and sessile serrated polyp 07/15/2010  . RBBB    POSS HX AF IN PAST- PAC'S  . Shortness of breath    WITH EXERTION ONLY  . Stroke ([place])    FEW YEARS AGO - PT SAYS "HEAT STROKE" X 2 - BUT NO PHYSICAL DEFICITS  . TIA (transient ischemic attack)    Past Surgical History:  Procedure  Laterality Date  . COLONOSCOPY    . TOTAL HIP ARTHROPLASTY Right 01/09/2013   Procedure: RIGHT TOTAL HIP ARTHROPLASTY ANTERIOR APPROACH;  Surgeon: [name], MD;  Location: [facility];  Service: Orthopedics;  Laterality: Right;   Current Outpatient Medications on File Prior to Visit  Medication Sig Dispense Refill  . amLODipine (NORVASC) 5 MG tablet Take 1 tablet (5 mg total) by mouth daily. 90 tablet 3  . aspirin EC 81 MG tablet Take 81 mg by mouth daily.    . fish oil-omega-3 fatty acids 1000 MG capsule Take 1 g by mouth daily.    . Multiple Vitamin (MULTIVITAMIN WITH MINERALS) TABS Take 1 tablet by mouth daily.    . pantoprazole (PROTONIX) 40 MG tablet TAKE 1 TABLET BY MOUTH ONCE DAILY 90 tablet 1   No current facility-administered medications on file prior to visit.    No Known Allergies Social History   Socioeconomic History  . Marital status: Married    Spouse name: Not on file  . Number of children: Not on file  . Years of education: Not on file  . Highest education level: Not on file  Social Needs  . Financial resource strain: Not on file  .  Food insecurity - worry: Not on file  . Food insecurity - inability: Not on file  . Transportation needs - medical: Not on file  . Transportation needs - non-medical: Not on file  Occupational History  . Occupation: Retired  Tobacco Use  . Smoking status: Former Smoker    Years: 57.00    Types: Cigarettes    Last attempt to quit: 05/25/2007    Years since quitting: 10.0  . Smokeless tobacco: Never Used  Substance and Sexual Activity  . Alcohol use: No    Alcohol/week: 0.0 oz  . Drug use: No  . Sexual activity: Not on file  Other Topics Concern  . 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      Objective:   Physical Exam  Constitutional: He appears well-developed and well-nourished. No  distress.   Neck: Neck supple. No JVD present. No tracheal deviation present. No thyromegaly present.  Cardiovascular: Normal rate, regular rhythm and normal heart sounds.   No murmur heard. Pulmonary/Chest: Effort normal and breath sounds normal. No stridor. No respiratory distress. He has no wheezes. He has no rales.   Skin: He is not diaphoretic.  Vitals reviewed.   Pain is located at the level of T3 just to the left of the spine just medial to the shoulder blade. Skin appears normal. He has pain with range of motion in his left shoulder when the left arm is held forward 90 similar to O'Brien's maneuver. He also has pain with gentle range of motion of the left shoulder however the pain is not in the shoulder itself      Assessment & Plan:  Mid back pain - Plan: DG Thoracic Spine W/Swimmers  I suspect that the patient has strained a muscle in his upper mid back possibly the subscapularis or the rhomboids.  Patient is already tried and failed 4 weeks of conservative therapy. I will try the patient on a prednisone taper pack as an anti-inflammatory and also use Flexeril 5-10 mg every 8 hours as needed for muscle spasms. I will also obtain an x-ray of the thoracic spine to evaluate for any possible thoracic degenerative disc disease that can mimic this pain pattern. If pain does not improve, I will gladly consult orthopedics but I suspect that they would recommend physical therapy

## 2017-05-31 ENCOUNTER — Other Ambulatory Visit: Payer: Self-pay | Admitting: Family Medicine

## 2017-05-31 DIAGNOSIS — M549 Dorsalgia, unspecified: Secondary | ICD-10-CM

## 2017-06-06 ENCOUNTER — Other Ambulatory Visit: Payer: Self-pay | Admitting: Family Medicine

## 2017-06-07 NOTE — Telephone Encounter (Signed)
Ok to refill 

## 2017-06-13 DIAGNOSIS — M5412 Radiculopathy, cervical region: Secondary | ICD-10-CM | POA: Diagnosis not present

## 2017-06-18 DIAGNOSIS — M546 Pain in thoracic spine: Secondary | ICD-10-CM | POA: Diagnosis not present

## 2017-06-19 ENCOUNTER — Other Ambulatory Visit: Payer: Self-pay | Admitting: Family Medicine

## 2017-06-20 DIAGNOSIS — M5412 Radiculopathy, cervical region: Secondary | ICD-10-CM | POA: Diagnosis not present

## 2017-06-20 NOTE — Telephone Encounter (Signed)
Requesting refill    Flexeril  LOV: 05/26/17 LRF:  06/07/17

## 2017-06-23 DIAGNOSIS — M542 Cervicalgia: Secondary | ICD-10-CM | POA: Diagnosis not present

## 2017-06-27 DIAGNOSIS — M5412 Radiculopathy, cervical region: Secondary | ICD-10-CM | POA: Diagnosis not present

## 2017-06-29 DIAGNOSIS — M5412 Radiculopathy, cervical region: Secondary | ICD-10-CM | POA: Diagnosis not present

## 2017-07-04 ENCOUNTER — Other Ambulatory Visit: Payer: Self-pay | Admitting: Family Medicine

## 2017-07-04 NOTE — Telephone Encounter (Signed)
Requesting refill    Flexeril  LOV:  05/26/17 LRF:  06/20/17

## 2017-07-05 DIAGNOSIS — M5412 Radiculopathy, cervical region: Secondary | ICD-10-CM | POA: Diagnosis not present

## 2017-07-06 ENCOUNTER — Telehealth: Payer: Self-pay | Admitting: Family Medicine

## 2017-07-06 NOTE — Telephone Encounter (Signed)
PA Submitted through [email or web address] and received the following:   Sent to [employer]  The plan will fax you a determination, typically within 1 to 5 business days.   Called pt LMTR

## 2017-07-07 MED ORDER — CYCLOBENZAPRINE HCL 10 MG PO TABS: 10.0000 mg | ORAL_TABLET | Freq: Three times a day (TID) | ORAL | 0 refills | Status: DC | PRN

## 2017-07-07 NOTE — Telephone Encounter (Signed)
Medication approved through ins - pharm aware

## 2017-07-08 DIAGNOSIS — M5412 Radiculopathy, cervical region: Secondary | ICD-10-CM | POA: Diagnosis not present

## 2017-07-11 ENCOUNTER — Ambulatory Visit: Payer: Medicare HMO | Admitting: Family Medicine

## 2017-07-11 DIAGNOSIS — M5412 Radiculopathy, cervical region: Secondary | ICD-10-CM | POA: Diagnosis not present

## 2017-07-13 DIAGNOSIS — M5412 Radiculopathy, cervical region: Secondary | ICD-10-CM | POA: Diagnosis not present

## 2017-07-19 DIAGNOSIS — M25512 Pain in left shoulder: Secondary | ICD-10-CM | POA: Diagnosis not present

## 2017-07-22 DIAGNOSIS — M5412 Radiculopathy, cervical region: Secondary | ICD-10-CM | POA: Diagnosis not present

## 2017-07-22 DIAGNOSIS — M25512 Pain in left shoulder: Secondary | ICD-10-CM | POA: Diagnosis not present

## 2017-08-05 ENCOUNTER — Other Ambulatory Visit: Payer: Self-pay | Admitting: Family Medicine

## 2017-08-05 NOTE — Telephone Encounter (Signed)
Requesting refill    Flexeril  LOV: 05/26/17  LRF:  07/07/17

## 2017-08-08 ENCOUNTER — Encounter: Payer: Self-pay | Admitting: Family Medicine

## 2017-08-09 DIAGNOSIS — M5412 Radiculopathy, cervical region: Secondary | ICD-10-CM | POA: Diagnosis not present

## 2017-08-16 ENCOUNTER — Other Ambulatory Visit: Payer: Self-pay | Admitting: Family Medicine

## 2017-09-06 ENCOUNTER — Other Ambulatory Visit: Payer: Self-pay | Admitting: Family Medicine

## 2017-09-06 NOTE — Telephone Encounter (Signed)
Requesting refill      LOV: 05/26/17  LRF:  08/05/17

## 2017-09-15 ENCOUNTER — Encounter: Payer: Self-pay | Admitting: Physician Assistant

## 2017-09-15 ENCOUNTER — Other Ambulatory Visit: Payer: Self-pay

## 2017-09-15 ENCOUNTER — Ambulatory Visit
Admission: RE | Admit: 2017-09-15 | Discharge: 2017-09-15 | Disposition: A | Discharge status: Home / Self Care | Payer: Medicare HMO | Source: Ambulatory Visit | Attending: Physician Assistant | Admitting: Physician Assistant

## 2017-09-15 ENCOUNTER — Ambulatory Visit (INDEPENDENT_AMBULATORY_CARE_PROVIDER_SITE_OTHER): Payer: Medicare HMO | Admitting: Physician Assistant

## 2017-09-15 VITALS — BP 140/86 | HR 93 | Temp 97.8°F | Resp 18 | Ht 71.0 in | Wt 229.8 lb

## 2017-09-15 DIAGNOSIS — S59911A Unspecified injury of right forearm, initial encounter: Secondary | ICD-10-CM | POA: Diagnosis not present

## 2017-09-15 DIAGNOSIS — M79631 Pain in right forearm: Secondary | ICD-10-CM | POA: Diagnosis not present

## 2017-09-15 DIAGNOSIS — S59901A Unspecified injury of right elbow, initial encounter: Secondary | ICD-10-CM | POA: Diagnosis not present

## 2017-09-15 DIAGNOSIS — M25521 Pain in right elbow: Secondary | ICD-10-CM | POA: Diagnosis not present

## 2017-09-15 DIAGNOSIS — M79602 Pain in left arm: Secondary | ICD-10-CM

## 2017-09-15 DIAGNOSIS — M25511 Pain in right shoulder: Secondary | ICD-10-CM | POA: Diagnosis not present

## 2017-09-15 DIAGNOSIS — S4991XA Unspecified injury of right shoulder and upper arm, initial encounter: Secondary | ICD-10-CM | POA: Diagnosis not present

## 2017-09-15 DIAGNOSIS — M25512 Pain in left shoulder: Secondary | ICD-10-CM

## 2017-09-15 MED ORDER — HYDROCODONE-ACETAMINOPHEN 5-325 MG PO TABS: 1.0000 | ORAL_TABLET | ORAL | 0 refills | Status: DC | PRN

## 2017-09-15 NOTE — Progress Notes (Signed)
Patient ID: Brendan Hopkins MRN: [id number], DOB: [birth year], 82 y.o. Date of Encounter: 09/15/2017, 3:44 PM    Chief Complaint:  Chief Complaint  Patient presents with  . right arm and shoulder pain    fell today      HPI: 82 y.o. year old male presents with above.   Reports that his wife passed away last 23-Nov-2022. Since then, his daughter passed away. Also mentions a third family member who has also passed away in the past year.  Says that when his wife was alive, she would take care of the plants.  Says that they would set the plants under the house-- has area where can walk up under the house and just sets the plants right there during winter and then puts them out during the warm weather. He was going into that area --under the house-- today to get those plants out.  There is a threshold at the door.  Says that the pot was coming loose from the saucer underneath that--- he was trying to keep those 2 pieces together ---and in the meanwhile his foot was up against that threshold--- and then he fell.  Thinks that he fell onto the right arm with the right arm down against his body.  Fell to the floor/ground. Has pain up towards the right shoulder and cannot lift and abduct from the right shoulder.  Also has bruising and skin tear on the right forearm.  Says that when it first happened especially was really afraid he had broken a bone.  Also concerned because he could not even lift his arm to drive over here-- had to use the left arm to drive.     Home Meds:   Outpatient Medications Prior to Visit  Medication Sig Dispense Refill  . amLODipine (NORVASC) 5 MG tablet Take 1 tablet (5 mg total) by mouth daily. 90 tablet 3  . aspirin EC 81 MG tablet Take 81 mg by mouth daily.    . cyclobenzaprine (FLEXERIL) 10 MG tablet Take 1 tablet (10 mg total) by mouth 3 (three) times daily as needed. for muscle spams 30 tablet 0  . fish oil-omega-3 fatty acids 1000 MG capsule Take 1 g by mouth  daily.    . Multiple Vitamin (MULTIVITAMIN WITH MINERALS) TABS Take 1 tablet by mouth daily.    . pantoprazole (PROTONIX) 40 MG tablet TAKE 1 TABLET BY MOUTH ONCE DAILY 90 tablet 3  . cyclobenzaprine (FLEXERIL) 10 MG tablet TAKE 1 TABLET BY MOUTH THREE TIMES DAILY AS NEEDED FOR MUSCLE SPASM 30 tablet 1  . predniSONE (DELTASONE) 20 MG tablet 3 tabs poqday 1-2, 2 tabs poqday 3-4, 1 tab poqday 5-6 12 tablet 0   No facility-administered medications prior to visit.     Allergies: No Known Allergies    Review of Systems: See HPI for pertinent ROS. All other ROS negative.    Physical Exam: Blood pressure 140/86, pulse 93, temperature 97.8 F (36.6 C), temperature source Oral, resp. rate 18, height 5\' 11"  (1.803 m), weight 104.2 kg (229 lb 12.8 oz), SpO2 94 %., Body mass index is 32.05 kg/m. General:  WNWD WM. Appears in no acute distress. Neck: Supple. No thyromegaly. No lymphadenopathy. Lungs: Clear bilaterally to auscultation without wheezes, rales, or rhonchi. Breathing is unlabored. Heart: Regular rhythm. No murmurs, rubs, or gallops. Msk:  Strength and tone normal for age. Extremities/Skin:  Right Shoulder: Inspection of shoulder region appears normal with no ecchymosis and no abrasion. Passive range of  motion is intact.  However active range of motion is extremely limited. Forearm strength against resistance with adduction and abduction are intact. Elbow region appears normal.  Just distal to the elbow--on lateral aspect of forearm---there is large area of ecchymosis--dark purple/black color. At the proximal edge of this, just distal to the elbow--there is small skin tear.  Neuro: Alert and oriented X 3. Moves all extremities spontaneously. Gait is normal. CNII-XII grossly in tact. Psych:  Responds to questions appropriately with a normal affect.     ASSESSMENT AND PLAN:  82 y.o. year old male with  1. Right arm pain Obtain x-ray of right shoulder, humerus, elbow, forearm. Site  of skin tear was cleaned and bandaged.  He is to keep this area clean and dry. I will call him when I get x-ray reports. Have prescribed hydrocodone 5 mg to have available in case pain increases especially at night.  Gave #30+0. Follow-up if needed.  Otherwise I will follow-up with him when I get x-ray result. - DG Elbow 2 Views Right; Future - DG Forearm Right; Future - DG Humerus Right; Future - DG Shoulder Right; Future  2. Acute pain of Right shoulder    Signed, [address] [place], [place], [facility] 09/15/2017 3:44 PM

## 2017-09-29 ENCOUNTER — Telehealth: Payer: Self-pay | Admitting: Family Medicine

## 2017-09-29 NOTE — Telephone Encounter (Signed)
Patient is calling to speak with you regarding the fall he had  And about the medication he is taking for this [phone]

## 2017-09-30 NOTE — Telephone Encounter (Signed)
LMTRC

## 2017-10-06 NOTE — Telephone Encounter (Signed)
LMTRC

## 2017-10-11 DIAGNOSIS — M5412 Radiculopathy, cervical region: Secondary | ICD-10-CM | POA: Diagnosis not present

## 2017-10-11 DIAGNOSIS — M25511 Pain in right shoulder: Secondary | ICD-10-CM | POA: Diagnosis not present

## 2017-11-03 DIAGNOSIS — Z833 Family history of diabetes mellitus: Secondary | ICD-10-CM | POA: Diagnosis not present

## 2017-11-03 DIAGNOSIS — I10 Essential (primary) hypertension: Secondary | ICD-10-CM | POA: Diagnosis not present

## 2017-11-03 DIAGNOSIS — Z6832 Body mass index (BMI) 32.0-32.9, adult: Secondary | ICD-10-CM | POA: Diagnosis not present

## 2017-11-03 DIAGNOSIS — K219 Gastro-esophageal reflux disease without esophagitis: Secondary | ICD-10-CM | POA: Diagnosis not present

## 2017-11-03 DIAGNOSIS — Z803 Family history of malignant neoplasm of breast: Secondary | ICD-10-CM | POA: Diagnosis not present

## 2017-11-03 DIAGNOSIS — Z809 Family history of malignant neoplasm, unspecified: Secondary | ICD-10-CM | POA: Diagnosis not present

## 2017-11-03 DIAGNOSIS — E669 Obesity, unspecified: Secondary | ICD-10-CM | POA: Diagnosis not present

## 2017-11-03 DIAGNOSIS — Z8249 Family history of ischemic heart disease and other diseases of the circulatory system: Secondary | ICD-10-CM | POA: Diagnosis not present

## 2017-11-03 DIAGNOSIS — Z87891 Personal history of nicotine dependence: Secondary | ICD-10-CM | POA: Diagnosis not present

## 2017-11-03 DIAGNOSIS — R69 Illness, unspecified: Secondary | ICD-10-CM | POA: Diagnosis not present

## 2017-12-01 DIAGNOSIS — M25511 Pain in right shoulder: Secondary | ICD-10-CM | POA: Diagnosis not present

## 2017-12-05 DIAGNOSIS — Z85828 Personal history of other malignant neoplasm of skin: Secondary | ICD-10-CM | POA: Diagnosis not present

## 2017-12-05 DIAGNOSIS — L819 Disorder of pigmentation, unspecified: Secondary | ICD-10-CM | POA: Diagnosis not present

## 2017-12-05 DIAGNOSIS — L720 Epidermal cyst: Secondary | ICD-10-CM | POA: Diagnosis not present

## 2017-12-05 DIAGNOSIS — L821 Other seborrheic keratosis: Secondary | ICD-10-CM | POA: Diagnosis not present

## 2017-12-05 DIAGNOSIS — L578 Other skin changes due to chronic exposure to nonionizing radiation: Secondary | ICD-10-CM | POA: Diagnosis not present

## 2017-12-05 DIAGNOSIS — D229 Melanocytic nevi, unspecified: Secondary | ICD-10-CM | POA: Diagnosis not present

## 2017-12-05 DIAGNOSIS — L814 Other melanin hyperpigmentation: Secondary | ICD-10-CM | POA: Diagnosis not present

## 2017-12-05 DIAGNOSIS — L570 Actinic keratosis: Secondary | ICD-10-CM | POA: Diagnosis not present

## 2017-12-16 DIAGNOSIS — M25511 Pain in right shoulder: Secondary | ICD-10-CM | POA: Diagnosis not present

## 2017-12-20 ENCOUNTER — Ambulatory Visit (INDEPENDENT_AMBULATORY_CARE_PROVIDER_SITE_OTHER): Payer: Medicare HMO | Admitting: Family Medicine

## 2017-12-20 ENCOUNTER — Encounter: Payer: Self-pay | Admitting: Family Medicine

## 2017-12-20 VITALS — BP 150/80 | HR 78 | Temp 97.9°F | Resp 18 | Ht 71.0 in | Wt 234.0 lb

## 2017-12-20 DIAGNOSIS — E785 Hyperlipidemia, unspecified: Secondary | ICD-10-CM | POA: Diagnosis not present

## 2017-12-20 DIAGNOSIS — G459 Transient cerebral ischemic attack, unspecified: Secondary | ICD-10-CM

## 2017-12-20 DIAGNOSIS — Z125 Encounter for screening for malignant neoplasm of prostate: Secondary | ICD-10-CM

## 2017-12-20 DIAGNOSIS — I10 Essential (primary) hypertension: Secondary | ICD-10-CM | POA: Diagnosis not present

## 2017-12-20 DIAGNOSIS — Z13220 Encounter for screening for lipoid disorders: Secondary | ICD-10-CM | POA: Diagnosis not present

## 2017-12-20 DIAGNOSIS — R7309 Other abnormal glucose: Secondary | ICD-10-CM | POA: Diagnosis not present

## 2017-12-20 DIAGNOSIS — Z0000 Encounter for general adult medical examination without abnormal findings: Secondary | ICD-10-CM

## 2017-12-20 DIAGNOSIS — M79602 Pain in left arm: Secondary | ICD-10-CM | POA: Diagnosis not present

## 2017-12-20 MED ORDER — HYDROCODONE-ACETAMINOPHEN 7.5-325 MG PO TABS: 1.0000 | ORAL_TABLET | Freq: Four times a day (QID) | ORAL | 0 refills | Status: DC | PRN

## 2017-12-20 NOTE — Progress Notes (Signed)
Subjective:    Patient ID: Brendan Hopkins, male    DOB: 01-01-1936, 82 y.o.   MRN: [id number]  HPI Patient is here today for complete physical exam. He denies any concerns.  Since I last saw him, the patient suffered an accidental fall and injured his right rotator cuff.  He is planning to have surgical correction in the next month or so with orthopedics.  He is having severe pain at night that keeps him awake.  During the daytime he does relatively well but at night he is constantly awakened by severe shooting pain in his right shoulder.  He would be willing to consider pain medication at night that he could use sparingly to help him sleep until he has surgical correction.  He is due today for a PSA for prostate cancer screening.  His colonoscopy was last performed in 2015 and is good until 2020.  He sees Dr. Carlean Purl with gastroenterology.  His immunizations are up-to-date: Immunization History  Administered Date(s) Administered  . Influenza Split 02/15/2011  . Influenza,inj,Quad PF,6+ Mos 04/09/2013, 02/12/2014, 03/14/2015, 03/04/2016, 03/02/2017  . Pneumococcal Conjugate-13 04/09/2013  . Pneumococcal Polysaccharide-23 12/19/2014  . Td 09/10/2003    Past Medical History:  Diagnosis Date  . Arthritis    RIGHT HIP, HANDS, BACK  . Cataract   . Cervical radiculopathy    severe C3-4 neuroforaminal stenosis, C5-6 left synovial cyst compressing left nerve root.  . Colon polyps    2005  . COPD (chronic obstructive pulmonary disease) ([place])    SMOKED FOR 44 YRS  . Diverticulosis    2005  . Fatty liver   . GERD (gastroesophageal reflux disease)   . Hypertension   . Pain    RIGHT HIP AND BACK  . Personal history of colonic polyps-adenoma and sessile serrated polyp 07/15/2010  . RBBB    POSS HX AF IN PAST- PAC'S  . Shortness of breath    WITH EXERTION ONLY  . Stroke ([place])    FEW YEARS AGO - PT SAYS "HEAT STROKE" X 2 - BUT NO PHYSICAL DEFICITS  . TIA (transient ischemic attack)     Past Surgical History:  Procedure Laterality Date  . COLONOSCOPY    . TOTAL HIP ARTHROPLASTY Right 01/09/2013   Procedure: RIGHT TOTAL HIP ARTHROPLASTY ANTERIOR APPROACH;  Surgeon: [name], MD;  Location: [facility];  Service: Orthopedics;  Laterality: Right;   Current Outpatient Medications on File Prior to Visit  Medication Sig Dispense Refill  . amLODipine (NORVASC) 5 MG tablet Take 1 tablet (5 mg total) by mouth daily. 90 tablet 3  . aspirin EC 81 MG tablet Take 81 mg by mouth daily.    . fish oil-omega-3 fatty acids 1000 MG capsule Take 1 g by mouth daily.    Marland Kitchen HYDROcodone-acetaminophen (NORCO/VICODIN) 5-325 MG tablet Take 1 tablet by mouth every 4 (four) hours as needed. 30 tablet 0  . Multiple Vitamin (MULTIVITAMIN WITH MINERALS) TABS Take 1 tablet by mouth daily.    . pantoprazole (PROTONIX) 40 MG tablet TAKE 1 TABLET BY MOUTH ONCE DAILY 90 tablet 3   No current facility-administered medications on file prior to visit.    No Known Allergies Social History   Socioeconomic History  . Marital status: Married    Spouse name: Not on file  . Number of children: Not on file  . Years of education: Not on file  . Highest education level: Not on file  Occupational History  . Occupation: Retired  Science writer  Needs  . Financial resource strain: Not on file  . Food insecurity:    Worry: Not on file    Inability: Not on file  . Transportation needs:    Medical: Not on file    Non-medical: Not on file  Tobacco Use  . Smoking status: Former Smoker    Years: 57.00    Types: Cigarettes    Last attempt to quit: 05/25/2007    Years since quitting: 10.5  . Smokeless tobacco: Never Used  Substance and Sexual Activity  . Alcohol use: No    Alcohol/week: 0.0 oz  . Drug use: No  . Sexual activity: Not on file  Lifestyle  . Physical activity:    Days per week: Not on file    Minutes per session: Not on file  . Stress: Not on file  Relationships  . Social connections:    Talks on  phone: Not on file    Gets together: Not on file    Attends religious service: Not on file    Active member of club or organization: Not on file    Attends meetings of clubs or organizations: Not on file    Relationship status: Not on file  . Intimate partner violence:    Fear of current or ex partner: Not on file    Emotionally abused: Not on file    Physically abused: Not on file    Forced sexual activity: Not on file  Other Topics Concern  . 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 Cancer Father   . Cancer Mother        died more of old age at age 70  . Colon cancer Neg Hx   . Esophageal cancer Neg Hx   . Prostate cancer Neg Hx   . Rectal cancer Neg Hx   . Stomach cancer Neg Hx       Review of Systems  All other systems reviewed and are negative.      Objective:   Physical Exam  Constitutional: He is oriented to person, place, and time. He appears well-developed and well-nourished. No distress.  HENT:  Head: Normocephalic and atraumatic.  Right Ear: External ear normal.  Left Ear: External ear normal.  Nose: Nose normal.  Mouth/Throat: Oropharynx is clear and moist. No oropharyngeal exudate.  Eyes: Pupils are equal, round, and reactive to light. Conjunctivae and EOM are normal. Right eye exhibits no discharge. Left eye exhibits no discharge. No scleral icterus.  Neck: Normal range of motion. Neck supple. No JVD present. No tracheal deviation present. No thyromegaly present.  Cardiovascular: Normal rate, regular rhythm, normal heart sounds and intact distal pulses. Exam reveals no gallop and no friction rub.  No murmur heard. Pulmonary/Chest: Effort normal and breath sounds normal. No stridor. No respiratory distress. He has no wheezes. He has no rales. He exhibits no tenderness.  Abdominal: Soft. Bowel sounds are normal. He exhibits no distension and  no mass. There is no tenderness. There is no rebound and no guarding.  Musculoskeletal: Normal range of motion. He exhibits no edema or tenderness.  Lymphadenopathy:    He has no cervical adenopathy.  Neurological: He is alert and oriented to person, place, and time. He has normal reflexes. No cranial nerve deficit. He exhibits normal muscle tone. Coordination normal.  Skin: Skin is warm. No rash noted. He  is not diaphoretic. No erythema. No pallor.  Psychiatric: He has a normal mood and affect. His behavior is normal. Judgment and thought content normal.  Vitals reviewed.         Assessment & Plan:  General medical exam - Plan: CBC with Differential/Platelet, COMPLETE METABOLIC PANEL WITH GFR, Lipid panel, PSA  Right arm pain  Prostate cancer screening  TIA (transient ischemic attack)  Benign essential HTN  Patient's physical exam is normal except for his elevated blood pressure and his severe pain in his right shoulder.  I have recommended hydrocodone/acetaminophen 7.5/325 p.o. nightly to help him sleep until he sees the surgeon.  I recommended that he check his blood pressure every day and bring those values by so that we can check them.  His goal blood pressures less than 140/90.  We may need to uptitrate his medication.  Given his history of a TIA, I will check a fasting lipid panel and his goal LDL cholesterol is less than 70.  I will also screen for prostate cancer with a PSA and check a CBC as well as a CMP in addition to his fasting lipid panel.  Colonoscopy is up-to-date.  Immunizations are up-to-date.  Regular anticipatory guidance is provided.  Follow-up his blood pressure 1 to 2 weeks

## 2017-12-23 LAB — COMPLETE METABOLIC PANEL WITH GFR
AG Ratio: 1.8 (calc) (ref 1.0–2.5)
ALBUMIN MSPROF: 4.4 g/dL (ref 3.6–5.1)
ALT: 18 U/L (ref 9–46)
AST: 19 U/L (ref 10–35)
Alkaline phosphatase (APISO): 69 U/L (ref 40–115)
BUN: 22 mg/dL (ref 7–25)
CHLORIDE: 104 mmol/L (ref 98–110)
CO2: 23 mmol/L (ref 20–32)
Calcium: 9.7 mg/dL (ref 8.6–10.3)
Creat: 1.08 mg/dL (ref 0.70–1.18)
GFR, EST AFRICAN AMERICAN: 75 mL/min/{1.73_m2} (ref >=60)
GFR, EST NON AFRICAN AMERICAN: 65 mL/min/{1.73_m2} (ref >=60)
GLUCOSE: 138 mg/dL — AB (ref 65–99)
Globulin: 2.5 g/dL (calc) (ref 1.9–3.7)
Potassium: 4.2 mmol/L (ref 3.5–5.3)
Sodium: 139 mmol/L (ref 135–146)
TOTAL PROTEIN: 6.9 g/dL (ref 6.1–8.1)
Total Bilirubin: 0.8 mg/dL (ref 0.2–1.2)

## 2017-12-23 LAB — HEMOGLOBIN A1C
HEMOGLOBIN A1C: 6.1 %{Hb} — AB (ref <5.7)
MEAN PLASMA GLUCOSE: 128 (calc)
eAG (mmol/L): 7.1 (calc)

## 2017-12-23 LAB — CBC WITH DIFFERENTIAL/PLATELET
BASOS PCT: 1 %
Basophils Absolute: 70 cells/uL (ref 0–200)
EOS ABS: 252 {cells}/uL (ref 15–500)
Eosinophils Relative: 3.6 %
HCT: 46 % (ref 38.5–50.0)
HEMOGLOBIN: 16.3 g/dL (ref 13.2–17.1)
Lymphs Abs: 1442 cells/uL (ref 850–3900)
MCH: 32.7 pg (ref 27.0–33.0)
MCHC: 35.4 g/dL (ref 32.0–36.0)
MCV: 92.2 fL (ref 80.0–100.0)
MONOS PCT: 6.4 %
MPV: 10.6 fL (ref 7.5–12.5)
Neutro Abs: 4788 cells/uL (ref 1500–7800)
Neutrophils Relative %: 68.4 %
Platelets: 210 10*3/uL (ref 140–400)
RBC: 4.99 10*6/uL (ref 4.20–5.80)
RDW: 12.6 % (ref 11.0–15.0)
Total Lymphocyte: 20.6 %
WBC: 7 10*3/uL (ref 3.8–10.8)
WBCMIX: 448 {cells}/uL (ref 200–950)

## 2017-12-23 LAB — LIPID PANEL
CHOL/HDL RATIO: 3.9 (calc) (ref <5.0)
Cholesterol: 183 mg/dL (ref <200)
HDL: 47 mg/dL (ref >40)
LDL CHOLESTEROL (CALC): 113 mg/dL — AB
NON-HDL CHOLESTEROL (CALC): 136 mg/dL — AB (ref <130)
Triglycerides: 123 mg/dL (ref <150)

## 2017-12-23 LAB — TEST AUTHORIZATION

## 2017-12-23 LAB — PSA: PSA: 1.5 ng/mL (ref <=4.0)

## 2017-12-26 ENCOUNTER — Encounter: Payer: Self-pay | Admitting: Family Medicine

## 2018-01-02 ENCOUNTER — Encounter: Payer: Self-pay | Admitting: Family Medicine

## 2018-01-02 ENCOUNTER — Ambulatory Visit (INDEPENDENT_AMBULATORY_CARE_PROVIDER_SITE_OTHER): Payer: Medicare HMO | Admitting: Family Medicine

## 2018-01-02 VITALS — BP 130/76 | HR 68 | Temp 98.3°F | Resp 18 | Ht 71.0 in | Wt 235.0 lb

## 2018-01-02 DIAGNOSIS — I10 Essential (primary) hypertension: Secondary | ICD-10-CM

## 2018-01-02 DIAGNOSIS — M25511 Pain in right shoulder: Secondary | ICD-10-CM

## 2018-01-02 DIAGNOSIS — G8929 Other chronic pain: Secondary | ICD-10-CM

## 2018-01-02 DIAGNOSIS — H6123 Impacted cerumen, bilateral: Secondary | ICD-10-CM | POA: Diagnosis not present

## 2018-01-02 MED ORDER — HYDROCHLOROTHIAZIDE 25 MG PO TABS: 25.0000 mg | ORAL_TABLET | Freq: Every day | ORAL | 3 refills | Status: DC

## 2018-01-02 NOTE — Progress Notes (Signed)
Subjective:    Patient ID: Brendan Hopkins, male    DOB: May 17, 1936, 82 y.o.   MRN: [id number]  HPI  12/20/17 Patient is here today for complete physical exam. He denies any concerns.  Since I last saw him, the patient suffered an accidental fall and injured his right rotator cuff.  He is planning to have surgical correction in the next month or so with orthopedics.  He is having severe pain at night that keeps him awake.  During the daytime he does relatively well but at night he is constantly awakened by severe shooting pain in his right shoulder.  He would be willing to consider pain medication at night that he could use sparingly to help him sleep until he has surgical correction.  He is due today for a PSA for prostate cancer screening.  His colonoscopy was last performed in 2015 and is good until 2020.  He sees Dr. Carlean Purl with gastroenterology.  His immunizations are up-to-date.  At that time, my plan was: Patient's physical exam is normal except for his elevated blood pressure and his severe pain in his right shoulder.  I have recommended hydrocodone/acetaminophen 7.5/325 p.o. nightly to help him sleep until he sees the surgeon.  I recommended that he check his blood pressure every day and bring those values by so that we can check them.  His goal blood pressures less than 140/90.  We may need to uptitrate his medication.  Given his history of a TIA, I will check a fasting lipid panel and his goal LDL cholesterol is less than 70.  I will also screen for prostate cancer with a PSA and check a CBC as well as a CMP in addition to his fasting lipid panel.  Colonoscopy is up-to-date.  Immunizations are up-to-date.  Regular anticipatory guidance is provided.  Follow-up his blood pressure 1 to 2 weeks  01/02/18 Patient is here today requesting that his ears be cleaned out.  He is being evaluated for hearing aids tomorrow.  On physical exam, he has bilateral cerumen impactions.  This was removed easily  with irrigation and lavage.  The patient's hearing improved considerably simply with removal of cerumen impactions however he is still planning to go for hearing aid assessment in the morning.  He is also been monitoring his blood pressure.  His blood pressure at home has been consistent between 150 and 165/80-95.  He is taking amlodipine 5 mg a day.  He also states that the hydrocodone/acetaminophen 7.5/325 tablets are helping him sleep 2 hours 3 hours but they tend to wear off later in the night and he is unable to sleep after that.  He is still not getting a good night sleep.   Past Medical History:  Diagnosis Date  . Arthritis    RIGHT HIP, HANDS, BACK  . Cataract   . Cervical radiculopathy    severe C3-4 neuroforaminal stenosis, C5-6 left synovial cyst compressing left nerve root.  . Colon polyps    2005  . COPD (chronic obstructive pulmonary disease) ([place])    SMOKED FOR 68 YRS  . Diverticulosis    2005  . Fatty liver   . GERD (gastroesophageal reflux disease)   . Hypertension   . Pain    RIGHT HIP AND BACK  . Personal history of colonic polyps-adenoma and sessile serrated polyp 07/15/2010  . RBBB    POSS HX AF IN PAST- PAC'S  . Shortness of breath    WITH EXERTION ONLY  .  Stroke ([place])    FEW YEARS AGO - PT SAYS "HEAT STROKE" X 2 - BUT NO PHYSICAL DEFICITS  . TIA (transient ischemic attack)    Past Surgical History:  Procedure Laterality Date  . COLONOSCOPY    . TOTAL HIP ARTHROPLASTY Right 01/09/2013   Procedure: RIGHT TOTAL HIP ARTHROPLASTY ANTERIOR APPROACH;  Surgeon: [name], MD;  Location: [facility];  Service: Orthopedics;  Laterality: Right;   Current Outpatient Medications on File Prior to Visit  Medication Sig Dispense Refill  . amLODipine (NORVASC) 5 MG tablet Take 1 tablet (5 mg total) by mouth daily. 90 tablet 3  . aspirin EC 81 MG tablet Take 81 mg by mouth daily.    . fish oil-omega-3 fatty acids 1000 MG capsule Take 1 g by mouth daily.    Marland Kitchen  HYDROcodone-acetaminophen (NORCO) 7.5-325 MG tablet Take 1 tablet by mouth every 6 (six) hours as needed for moderate pain. 30 tablet 0  . Multiple Vitamin (MULTIVITAMIN WITH MINERALS) TABS Take 1 tablet by mouth daily.    . pantoprazole (PROTONIX) 40 MG tablet TAKE 1 TABLET BY MOUTH ONCE DAILY 90 tablet 3   No current facility-administered medications on file prior to visit.    No Known Allergies Social History   Socioeconomic History  . Marital status: Married    Spouse name: Not on file  . Number of children: Not on file  . Years of education: Not on file  . Highest education level: Not on file  Occupational History  . Occupation: Retired  Scientific laboratory technician  . Financial resource strain: Not on file  . Food insecurity:    Worry: Not on file    Inability: Not on file  . Transportation needs:    Medical: Not on file    Non-medical: Not on file  Tobacco Use  . Smoking status: Former Smoker    Years: 57.00    Types: Cigarettes    Last attempt to quit: 05/25/2007    Years since quitting: 10.6  . Smokeless tobacco: Never Used  Substance and Sexual Activity  . Alcohol use: No    Alcohol/week: 0.0 standard drinks  . Drug use: No  . Sexual activity: Not on file  Lifestyle  . Physical activity:    Days per week: Not on file    Minutes per session: Not on file  . Stress: Not on file  Relationships  . Social connections:    Talks on phone: Not on file    Gets together: Not on file    Attends religious service: Not on file    Active member of club or organization: Not on file    Attends meetings of clubs or organizations: Not on file    Relationship status: Not on file  . Intimate partner violence:    Fear of current or ex partner: Not on file    Emotionally abused: Not on file    Physically abused: Not on file    Forced sexual activity: Not on file  Other Topics Concern  . 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 Cancer Father   . Cancer Mother        died more of old age at age 32  . Colon cancer Neg Hx   . Esophageal cancer Neg Hx   . Prostate cancer Neg Hx   .  Rectal cancer Neg Hx   . Stomach cancer Neg Hx       Review of Systems  All other systems reviewed and are negative.      Objective:   Physical Exam  Constitutional: He appears well-developed and well-nourished. No distress.  HENT:  Right Ear: External ear normal.  Left Ear: External ear normal.  Cardiovascular: Normal rate, regular rhythm, normal heart sounds and intact distal pulses. Exam reveals no gallop and no friction rub.  No murmur heard. Pulmonary/Chest: Effort normal and breath sounds normal. No respiratory distress. He has no wheezes. He has no rales. He exhibits no tenderness.  Musculoskeletal: He exhibits tenderness.  Skin: He is not diaphoretic.  Vitals reviewed.         Assessment & Plan:  Bilateral cerumen impactions-removed easily with irrigation and lavage without complication  Essential hypertension-continue amlodipine and supplement with hydrochlorothiazide 25 mg p.o. daily and recheck blood pressure in 2 to 3 weeks  Shoulder pain-patient can increase his hydrocodone/acetaminophen to 1-1/2 tablets p.o. nightly and see if this gets him a little bit longer pain-free interval so that he can sleep better.  He has an appointment later this week to see orthopedist.

## 2018-01-04 DIAGNOSIS — M75121 Complete rotator cuff tear or rupture of right shoulder, not specified as traumatic: Secondary | ICD-10-CM | POA: Diagnosis not present

## 2018-01-06 ENCOUNTER — Other Ambulatory Visit: Payer: Self-pay | Admitting: Family Medicine

## 2018-01-06 MED ORDER — HYDROCODONE-ACETAMINOPHEN 7.5-325 MG PO TABS: 1.0000 | ORAL_TABLET | Freq: Four times a day (QID) | ORAL | 0 refills | Status: DC | PRN

## 2018-01-06 NOTE — Telephone Encounter (Signed)
Patient is requesting a refill on Hydrocodone   LOV: 01/02/18  LRF:   12/20/17

## 2018-01-06 NOTE — Telephone Encounter (Signed)
Patient needs refill on his hydrocodone  To be sent to [place]

## 2018-01-13 ENCOUNTER — Other Ambulatory Visit: Payer: Self-pay | Admitting: Family Medicine

## 2018-01-17 DIAGNOSIS — S46011S Strain of muscle(s) and tendon(s) of the rotator cuff of right shoulder, sequela: Secondary | ICD-10-CM | POA: Diagnosis not present

## 2018-01-17 DIAGNOSIS — M659 Synovitis and tenosynovitis, unspecified: Secondary | ICD-10-CM | POA: Diagnosis not present

## 2018-01-17 DIAGNOSIS — M7521 Bicipital tendinitis, right shoulder: Secondary | ICD-10-CM | POA: Diagnosis not present

## 2018-01-17 DIAGNOSIS — G8918 Other acute postprocedural pain: Secondary | ICD-10-CM | POA: Diagnosis not present

## 2018-01-17 DIAGNOSIS — Y999 Unspecified external cause status: Secondary | ICD-10-CM | POA: Diagnosis not present

## 2018-01-17 DIAGNOSIS — S46011A Strain of muscle(s) and tendon(s) of the rotator cuff of right shoulder, initial encounter: Secondary | ICD-10-CM | POA: Diagnosis not present

## 2018-01-17 DIAGNOSIS — M7541 Impingement syndrome of right shoulder: Secondary | ICD-10-CM | POA: Diagnosis not present

## 2018-01-25 DIAGNOSIS — M25511 Pain in right shoulder: Secondary | ICD-10-CM | POA: Diagnosis not present

## 2018-01-25 DIAGNOSIS — Z9889 Other specified postprocedural states: Secondary | ICD-10-CM | POA: Diagnosis not present

## 2018-01-26 ENCOUNTER — Telehealth: Payer: Self-pay | Admitting: Family Medicine

## 2018-01-26 NOTE — Telephone Encounter (Signed)
Pt came in and states that he will start doing doing his blood pressure readings and that he will bring them back since pickard requested 2 weeks. He has had shoulder surgery and that is why he had not done it yet.   Also, pt dropped off empty toe nail fungus medication that he bought otc and states to tell pickard to advise all his patients with toe nail fungus to use "KERASAL" because it "WORKS"

## 2018-01-31 DIAGNOSIS — M25511 Pain in right shoulder: Secondary | ICD-10-CM | POA: Diagnosis not present

## 2018-02-03 DIAGNOSIS — M25511 Pain in right shoulder: Secondary | ICD-10-CM | POA: Diagnosis not present

## 2018-02-07 DIAGNOSIS — M25511 Pain in right shoulder: Secondary | ICD-10-CM | POA: Diagnosis not present

## 2018-02-10 ENCOUNTER — Telehealth: Payer: Self-pay | Admitting: Family Medicine

## 2018-02-10 DIAGNOSIS — M25511 Pain in right shoulder: Secondary | ICD-10-CM | POA: Diagnosis not present

## 2018-02-10 NOTE — Telephone Encounter (Signed)
Can't do that, come in to discuss.

## 2018-02-10 NOTE — Telephone Encounter (Signed)
Pt called and states that he is having to take a lot of the IBGuard medication and it is very expensive (30$ q 3 days) and he had some hydrocodone left over and started taking that tid for the last 3 days and has had no pain what so ever and would like a refill for this. OK to refill ? LRF - 01/06/18

## 2018-02-13 NOTE — Telephone Encounter (Signed)
Pt aware and apt made 

## 2018-02-14 DIAGNOSIS — M25511 Pain in right shoulder: Secondary | ICD-10-CM | POA: Diagnosis not present

## 2018-02-17 ENCOUNTER — Encounter: Payer: Self-pay | Admitting: Family Medicine

## 2018-02-17 ENCOUNTER — Ambulatory Visit (INDEPENDENT_AMBULATORY_CARE_PROVIDER_SITE_OTHER): Payer: Medicare HMO | Admitting: Family Medicine

## 2018-02-17 VITALS — BP 136/74 | HR 100 | Temp 97.9°F | Resp 18 | Ht 71.0 in | Wt 225.0 lb

## 2018-02-17 DIAGNOSIS — Z23 Encounter for immunization: Secondary | ICD-10-CM

## 2018-02-17 DIAGNOSIS — K582 Mixed irritable bowel syndrome: Secondary | ICD-10-CM | POA: Diagnosis not present

## 2018-02-17 MED ORDER — OXYCODONE-ACETAMINOPHEN 10-325 MG PO TABS: 1.0000 | ORAL_TABLET | Freq: Two times a day (BID) | ORAL | 0 refills | Status: DC

## 2018-02-17 NOTE — Addendum Note (Signed)
Addended by: Shary Decamp B on: 02/17/2018 02:04 PM   Modules accepted: Orders

## 2018-02-17 NOTE — Progress Notes (Signed)
Subjective:    Patient ID: Brendan Hopkins, male    DOB: 1936/02/05, 82 y.o.   MRN: [id number]  HPI  At the end of 2015 in the beginning of 2016, I started to see the patient for unexplained abdominal pain.  Work-up at that time included a CT scan that showed no cause for the patient's abdominal pain: IMPRESSION: 1. There is levoscoliosis in extensive degenerative changes lumbar spine are noted. Degenerative changes left hip joint. Right hip prosthesis in anatomic alignment. 2. No small bowel or colonic obstruction. 3. Mild elevation of the left hemidiaphragm with left lung base posterior atelectasis. 4. Left colon diverticula are noted. Multiple sigmoid colon diverticula. No evidence of acute diverticulitis or colitis. 5. No hydronephrosis or hydroureter. Left nonobstructive nephrolithiasis. No calcified ureteral calculi. Bilateral duplication of proximal ureter. 6. No pericecal inflammation.  Normal appendix partially visualized. 7. There is 6 mm non calcified nodule in right lower lobe partially visualized. If the patient is at high risk for bronchogenic carcinoma, follow-up chest CT at 6-12 months is recommended. If the patient is at low risk for bronchogenic carcinoma, follow-up chest CT at 12 months is recommended. This recommendation follows the consensus statement: Guidelines for Management of Small Pulmonary Nodules Detected on CT Scans: A Statement from the [employer] as published in Radiology 2005;237:395-400.  She had a colonoscopy in 2015, and upper endoscopy in 2016.  He saw gastroenterology, Dr. Carlean Purl, and was diagnosed with irritable bowel syndrome.  Numerous agents including Levsin have been tried.  Ultimately the patient was started on IBGard.  However he has to take 6 pills a day in order to control his abdominal pain which is primarily left upper quadrant and left lower quadrant and cramp-like in nature.  This manages the pain however it caused him more  than $120 per month.  Recently with his shoulder surgery, he had oxycodone left over which he actually took for the abdominal pain and the pain improved dramatically.  He is here today to discuss possibly supplementing his natural supplement with oxycodone to help manage his abdominal pain.  He would primarily want to take the medication at night to help him sleep as the abdominal pain frequently awakens him from sleep. Past Medical History:  Diagnosis Date  . Arthritis    RIGHT HIP, HANDS, BACK  . Cataract   . Cervical radiculopathy    severe C3-4 neuroforaminal stenosis, C5-6 left synovial cyst compressing left nerve root.  . Colon polyps    2005  . COPD (chronic obstructive pulmonary disease) ([place])    SMOKED FOR 68 YRS  . Diverticulosis    2005  . Fatty liver   . GERD (gastroesophageal reflux disease)   . Hypertension   . Pain    RIGHT HIP AND BACK  . Personal history of colonic polyps-adenoma and sessile serrated polyp 07/15/2010  . RBBB    POSS HX AF IN PAST- PAC'S  . Shortness of breath    WITH EXERTION ONLY  . Stroke ([place])    FEW YEARS AGO - PT SAYS "HEAT STROKE" X 2 - BUT NO PHYSICAL DEFICITS  . TIA (transient ischemic attack)    Past Surgical History:  Procedure Laterality Date  . COLONOSCOPY    . TOTAL HIP ARTHROPLASTY Right 01/09/2013   Procedure: RIGHT TOTAL HIP ARTHROPLASTY ANTERIOR APPROACH;  Surgeon: [name], MD;  Location: [facility];  Service: Orthopedics;  Laterality: Right;   Current Outpatient Medications on File Prior to Visit  Medication  Sig Dispense Refill  . amLODipine (NORVASC) 5 MG tablet Take 1 tablet (5 mg total) by mouth daily. 90 tablet 3  . aspirin EC 81 MG tablet Take 81 mg by mouth daily.    . fish oil-omega-3 fatty acids 1000 MG capsule Take 1 g by mouth daily.    . hydrochlorothiazide (HYDRODIURIL) 25 MG tablet Take 1 tablet (25 mg total) by mouth daily. 90 tablet 3  . HYDROcodone-acetaminophen (NORCO) 7.5-325 MG tablet Take 1 tablet by  mouth every 6 (six) hours as needed for moderate pain. 30 tablet 0  . meclizine (ANTIVERT) 25 MG tablet TAKE ONE TABLET BY MOUTH THREE TIMES DAILY AS NEEDED FOR DIZZINESS 30 tablet 2  . Multiple Vitamin (MULTIVITAMIN WITH MINERALS) TABS Take 1 tablet by mouth daily.    . pantoprazole (PROTONIX) 40 MG tablet TAKE 1 TABLET BY MOUTH ONCE DAILY 90 tablet 3   No current facility-administered medications on file prior to visit.    No Known Allergies Social History   Socioeconomic History  . Marital status: Married    Spouse name: Not on file  . Number of children: Not on file  . Years of education: Not on file  . Highest education level: Not on file  Occupational History  . Occupation: Retired  Scientific laboratory technician  . Financial resource strain: Not on file  . Food insecurity:    Worry: Not on file    Inability: Not on file  . Transportation needs:    Medical: Not on file    Non-medical: Not on file  Tobacco Use  . Smoking status: Former Smoker    Years: 57.00    Types: Cigarettes    Last attempt to quit: 05/25/2007    Years since quitting: 10.7  . Smokeless tobacco: Never Used  Substance and Sexual Activity  . Alcohol use: No    Alcohol/week: 0.0 standard drinks  . Drug use: No  . Sexual activity: Not on file  Lifestyle  . Physical activity:    Days per week: Not on file    Minutes per session: Not on file  . Stress: Not on file  Relationships  . Social connections:    Talks on phone: Not on file    Gets together: Not on file    Attends religious service: Not on file    Active member of club or organization: Not on file    Attends meetings of clubs or organizations: Not on file    Relationship status: Not on file  . Intimate partner violence:    Fear of current or ex partner: Not on file    Emotionally abused: Not on file    Physically abused: Not on file    Forced sexual activity: Not on file  Other Topics Concern  . 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 Arthritis    RIGHT HIP, HANDS, BACK  . Cataract   . Cervical radiculopathy    severe C3-4 neuroforaminal stenosis, C5-6 left synovial cyst compressing left nerve root.  . Colon polyps    2005  . COPD (chronic obstructive pulmonary disease) ([place])    SMOKED FOR 38 YRS  . Diverticulosis    2005  . Fatty liver   . GERD (gastroesophageal reflux disease)   . Hypertension   . Pain    RIGHT HIP AND BACK  . Personal  history of colonic polyps-adenoma and sessile serrated polyp 07/15/2010  . RBBB    POSS HX AF IN PAST- PAC'S  . Shortness of breath    WITH EXERTION ONLY  . Stroke ([place])    FEW YEARS AGO - PT SAYS "HEAT STROKE" X 2 - BUT NO PHYSICAL DEFICITS  . TIA (transient ischemic attack)    Past Surgical History:  Procedure Laterality Date  . COLONOSCOPY    . TOTAL HIP ARTHROPLASTY Right 01/09/2013   Procedure: RIGHT TOTAL HIP ARTHROPLASTY ANTERIOR APPROACH;  Surgeon: [name], MD;  Location: [facility];  Service: Orthopedics;  Laterality: Right;   Current Outpatient Medications on File Prior to Visit  Medication Sig Dispense Refill  . amLODipine (NORVASC) 5 MG tablet Take 1 tablet (5 mg total) by mouth daily. 90 tablet 3  . aspirin EC 81 MG tablet Take 81 mg by mouth daily.    . fish oil-omega-3 fatty acids 1000 MG capsule Take 1 g by mouth daily.    . hydrochlorothiazide (HYDRODIURIL) 25 MG tablet Take 1 tablet (25 mg total) by mouth daily. 90 tablet 3  . HYDROcodone-acetaminophen (NORCO) 7.5-325 MG tablet Take 1 tablet by mouth every 6 (six) hours as needed for moderate pain. 30 tablet 0  . meclizine (ANTIVERT) 25 MG tablet TAKE ONE TABLET BY MOUTH THREE TIMES DAILY AS NEEDED FOR DIZZINESS 30 tablet 2  . Multiple Vitamin (MULTIVITAMIN WITH MINERALS) TABS Take 1 tablet by mouth daily.    . pantoprazole (PROTONIX) 40 MG tablet TAKE 1 TABLET BY MOUTH ONCE DAILY 90 tablet 3   No  current facility-administered medications on file prior to visit.    No Known Allergies Social History   Socioeconomic History  . Marital status: Married    Spouse name: Not on file  . Number of children: Not on file  . Years of education: Not on file  . Highest education level: Not on file  Occupational History  . Occupation: Retired  Scientific laboratory technician  . Financial resource strain: Not on file  . Food insecurity:    Worry: Not on file    Inability: Not on file  . Transportation needs:    Medical: Not on file    Non-medical: Not on file  Tobacco Use  . Smoking status: Former Smoker    Years: 57.00    Types: Cigarettes    Last attempt to quit: 05/25/2007    Years since quitting: 10.7  . Smokeless tobacco: Never Used  Substance and Sexual Activity  . Alcohol use: No    Alcohol/week: 0.0 standard drinks  . Drug use: No  . Sexual activity: Not on file  Lifestyle  . Physical activity:    Days per week: Not on file    Minutes per session: Not on file  . Stress: Not on file  Relationships  . Social connections:    Talks on phone: Not on file    Gets together: Not on file    Attends religious service: Not on file    Active member of club or organization: Not on file    Attends meetings of clubs or organizations: Not on file    Relationship status: Not on file  . Intimate partner violence:    Fear of current or ex partner: Not on file    Emotionally abused: Not on file    Physically abused: Not on file    Forced sexual activity: Not on file  Other Topics Concern  . 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      Objective:   Physical Exam  Cardiovascular: Normal rate, regular rhythm and normal heart sounds.  No murmur heard. Pulmonary/Chest: Effort normal and breath sounds normal. No respiratory distress. He has no wheezes. He has no  rales.  Abdominal: Soft. Bowel sounds are normal. He exhibits no distension and no mass. There is no tenderness. There is no rebound and no guarding.  Vitals reviewed.         Assessment & Plan:  Irritable bowel syndrome with both constipation and diarrhea  Patient has a 4-year history of IBS with diarrhea and constipation primarily characterized by crampy left-sided abdominal pain.  He has been scanned and also evaluated by GI and no cause was found.  Patient is very trustworthy.  I have no concerns about him abusing medication.  Given his age, I believe is reasonable for him to use Percocet 1 pill/day at night to help manage his chronic abdominal pain.  I will give him 60 tablets.  He can take it up to twice a day if necessary however I would like the 60 tablets to last 2 months of possible.  He can continue to take IBGard to manage his pain primarily however this is been limited mainly due to cost.  Recheck the patient in 2 months or as needed

## 2018-02-21 DIAGNOSIS — M25511 Pain in right shoulder: Secondary | ICD-10-CM | POA: Diagnosis not present

## 2018-02-24 DIAGNOSIS — M25511 Pain in right shoulder: Secondary | ICD-10-CM | POA: Diagnosis not present

## 2018-02-28 DIAGNOSIS — M25511 Pain in right shoulder: Secondary | ICD-10-CM | POA: Diagnosis not present

## 2018-03-03 DIAGNOSIS — M25511 Pain in right shoulder: Secondary | ICD-10-CM | POA: Diagnosis not present

## 2018-03-07 DIAGNOSIS — M25511 Pain in right shoulder: Secondary | ICD-10-CM | POA: Diagnosis not present

## 2018-03-10 DIAGNOSIS — M25511 Pain in right shoulder: Secondary | ICD-10-CM | POA: Diagnosis not present

## 2018-03-14 DIAGNOSIS — M25511 Pain in right shoulder: Secondary | ICD-10-CM | POA: Diagnosis not present

## 2018-03-17 DIAGNOSIS — M25511 Pain in right shoulder: Secondary | ICD-10-CM | POA: Diagnosis not present

## 2018-03-21 DIAGNOSIS — M25511 Pain in right shoulder: Secondary | ICD-10-CM | POA: Diagnosis not present

## 2018-03-22 ENCOUNTER — Other Ambulatory Visit: Payer: Self-pay | Admitting: Family Medicine

## 2018-03-24 DIAGNOSIS — M25511 Pain in right shoulder: Secondary | ICD-10-CM | POA: Diagnosis not present

## 2018-03-28 DIAGNOSIS — M25511 Pain in right shoulder: Secondary | ICD-10-CM | POA: Diagnosis not present

## 2018-03-31 DIAGNOSIS — M25511 Pain in right shoulder: Secondary | ICD-10-CM | POA: Diagnosis not present

## 2018-04-05 DIAGNOSIS — Z9889 Other specified postprocedural states: Secondary | ICD-10-CM | POA: Diagnosis not present

## 2018-04-07 DIAGNOSIS — D485 Neoplasm of uncertain behavior of skin: Secondary | ICD-10-CM | POA: Diagnosis not present

## 2018-04-07 DIAGNOSIS — D225 Melanocytic nevi of trunk: Secondary | ICD-10-CM | POA: Diagnosis not present

## 2018-04-07 DIAGNOSIS — L439 Lichen planus, unspecified: Secondary | ICD-10-CM | POA: Diagnosis not present

## 2018-04-07 DIAGNOSIS — C44622 Squamous cell carcinoma of skin of right upper limb, including shoulder: Secondary | ICD-10-CM | POA: Diagnosis not present

## 2018-04-07 DIAGNOSIS — D1801 Hemangioma of skin and subcutaneous tissue: Secondary | ICD-10-CM | POA: Diagnosis not present

## 2018-04-07 DIAGNOSIS — L570 Actinic keratosis: Secondary | ICD-10-CM | POA: Diagnosis not present

## 2018-04-07 DIAGNOSIS — L821 Other seborrheic keratosis: Secondary | ICD-10-CM | POA: Diagnosis not present

## 2018-04-17 ENCOUNTER — Other Ambulatory Visit: Payer: Self-pay | Admitting: Family Medicine

## 2018-04-17 MED ORDER — HYDROCODONE-ACETAMINOPHEN 7.5-325 MG PO TABS: 1.0000 | ORAL_TABLET | Freq: Four times a day (QID) | ORAL | 0 refills | Status: DC | PRN

## 2018-04-17 NOTE — Telephone Encounter (Signed)
Patient is requesting a refill on Hydrocodone   LOV: 02/17/18  LRF:   01/06/18

## 2018-04-17 NOTE — Telephone Encounter (Signed)
[place]  Patient needs his oxycodone

## 2018-04-18 ENCOUNTER — Telehealth: Payer: Self-pay | Admitting: Family Medicine

## 2018-04-18 DIAGNOSIS — D0461 Carcinoma in situ of skin of right upper limb, including shoulder: Secondary | ICD-10-CM | POA: Diagnosis not present

## 2018-04-18 MED ORDER — OXYCODONE-ACETAMINOPHEN 10-325 MG PO TABS: 1.0000 | ORAL_TABLET | Freq: Two times a day (BID) | ORAL | 0 refills | Status: DC

## 2018-04-18 NOTE — Telephone Encounter (Signed)
Can we send over for Oxycodone? I accidentally set up the RX for hydrocodone. I will call pharm if ok and give permission to fill the Oxycodone if ok!?!

## 2018-04-18 NOTE — Telephone Encounter (Signed)
Called and spoke to pharm and medication went through on ins and pt aware via vm

## 2018-04-18 NOTE — Telephone Encounter (Signed)
Pt was sent Norco instead of percocet, ha dboth on med list.  Percocet sent Will alert pharmacy of mistake. Pt not to take norco if he has picked up. If for some reason he can not get the Percocet, understandingly with the new narcotic laws, he can take very 6 hours as needed for pain

## 2018-05-02 ENCOUNTER — Emergency Department (HOSPITAL_COMMUNITY)
Admission: EM | Admit: 2018-05-02 | Discharge: 2018-05-02 | Disposition: A | Discharge status: Home / Self Care | Payer: Medicare HMO | Attending: Emergency Medicine | Admitting: Emergency Medicine

## 2018-05-02 ENCOUNTER — Encounter (HOSPITAL_COMMUNITY): Payer: Self-pay | Admitting: Internal Medicine

## 2018-05-02 DIAGNOSIS — Z08 Encounter for follow-up examination after completed treatment for malignant neoplasm: Secondary | ICD-10-CM | POA: Diagnosis not present

## 2018-05-02 DIAGNOSIS — J449 Chronic obstructive pulmonary disease, unspecified: Secondary | ICD-10-CM | POA: Insufficient documentation

## 2018-05-02 DIAGNOSIS — L03113 Cellulitis of right upper limb: Secondary | ICD-10-CM | POA: Diagnosis not present

## 2018-05-02 DIAGNOSIS — I10 Essential (primary) hypertension: Secondary | ICD-10-CM | POA: Insufficient documentation

## 2018-05-02 DIAGNOSIS — Z7982 Long term (current) use of aspirin: Secondary | ICD-10-CM | POA: Insufficient documentation

## 2018-05-02 DIAGNOSIS — Z85828 Personal history of other malignant neoplasm of skin: Secondary | ICD-10-CM | POA: Diagnosis not present

## 2018-05-02 DIAGNOSIS — Z87891 Personal history of nicotine dependence: Secondary | ICD-10-CM | POA: Diagnosis not present

## 2018-05-02 DIAGNOSIS — Z79899 Other long term (current) drug therapy: Secondary | ICD-10-CM | POA: Insufficient documentation

## 2018-05-02 DIAGNOSIS — L570 Actinic keratosis: Secondary | ICD-10-CM | POA: Diagnosis not present

## 2018-05-02 MED ORDER — CLINDAMYCIN HCL 150 MG PO CAPS: 300.0000 mg | ORAL_CAPSULE | Freq: Three times a day (TID) | ORAL | 0 refills | Status: AC

## 2018-05-02 NOTE — ED Notes (Signed)
ED Provider at bedside. 

## 2018-05-02 NOTE — ED Provider Notes (Signed)
[facility] Provider Note   CSN: [id number] Arrival date & time: 05/02/18  1497     History   Chief Complaint Chief Complaint  Patient presents with  . Arm Swelling    HPI Brendan Hopkins is a 82 y.o. male.  Patient with history of stroke, COPD, skin cancer currently undergoing topical treatment who presents the ED with bilateral arm redness and swelling.  Redness and swelling slightly worse on the right than the left.  Patient has been using topical creams to treat skin issue, supposedly cancer, to both arms.  He states that arms have been red and irritated and ever since using the cream that he started several weeks ago.  He tried to call his dermatologist for follow-up today but told over the phone that this was likely a normal reaction.  Patient denies any nausea, vomiting, chest pain.  The history is provided by the patient.  Illness  This is a new problem. The current episode started more than 1 week ago. The problem occurs daily. The problem has been gradually worsening. Pertinent negatives include no chest pain, no abdominal pain, no headaches and no shortness of breath. Nothing aggravates the symptoms. Nothing relieves the symptoms. He has tried nothing for the symptoms. The treatment provided no relief.    Past Medical History:  Diagnosis Date  . Arthritis    RIGHT HIP, HANDS, BACK  . Cataract   . Cervical radiculopathy    severe C3-4 neuroforaminal stenosis, C5-6 left synovial cyst compressing left nerve root.  . Colon polyps    2005  . COPD (chronic obstructive pulmonary disease) ([place])    SMOKED FOR 17 YRS  . Diverticulosis    2005  . Fatty liver   . GERD (gastroesophageal reflux disease)   . Hypertension   . Pain    RIGHT HIP AND BACK  . Personal history of colonic polyps-adenoma and sessile serrated polyp 07/15/2010  . RBBB    POSS HX AF IN PAST- PAC'S  . Shortness of breath    WITH EXERTION ONLY  . Stroke ([place])      FEW YEARS AGO - PT SAYS "HEAT STROKE" X 2 - BUT NO PHYSICAL DEFICITS  . TIA (transient ischemic attack)     Patient Active Problem List   Diagnosis Date Noted  . Obese 01/10/2013  . S/P right THA, AA 01/09/2013  . Dyspnea 10/20/2012  . Preop cardiovascular exam 10/20/2012  . Bruit 10/20/2012  . HLD (hyperlipidemia) 12/02/2011  . Impaired glucose tolerance 12/02/2011  . TIA (transient ischemic attack) 12/01/2011  . H/O atrial fibrillation without current medication 12/01/2011  . HTN (hypertension) 12/01/2011  . Arthritis 12/01/2011  . Personal history of colonic polyps-adenoma and sessile serrated polyp 07/15/2010    Past Surgical History:  Procedure Laterality Date  . COLONOSCOPY    . TOTAL HIP ARTHROPLASTY Right 01/09/2013   Procedure: RIGHT TOTAL HIP ARTHROPLASTY ANTERIOR APPROACH;  Surgeon: [name], MD;  Location: [facility];  Service: Orthopedics;  Laterality: Right;        Home Medications    Prior to Admission medications   Medication Sig Start Date End Date Taking? Authorizing Provider  amLODipine (NORVASC) 5 MG tablet TAKE 1 TABLET BY MOUTH ONCE DAILY 03/22/18   [name], MD  aspirin EC 81 MG tablet Take 81 mg by mouth daily.    [provider]  clindamycin (CLEOCIN) 150 MG capsule Take 2 capsules (300 mg total) by mouth 3 (  three) times daily for 10 days. 05/02/18 05/12/18  Keera Altidor, DO  fish oil-omega-3 fatty acids 1000 MG capsule Take 1 g by mouth daily.    [provider]  hydrochlorothiazide (HYDRODIURIL) 25 MG tablet Take 1 tablet (25 mg total) by mouth daily. 01/02/18   [name], MD  meclizine (ANTIVERT) 25 MG tablet TAKE ONE TABLET BY MOUTH THREE TIMES DAILY AS NEEDED FOR DIZZINESS 01/13/18   [name], MD  Multiple Vitamin (MULTIVITAMIN WITH MINERALS) TABS Take 1 tablet by mouth daily.    [provider]  oxyCODONE-acetaminophen (PERCOCET) 10-325 MG tablet Take 1 tablet by mouth 2 (two) times daily  at 10 AM and 5 PM. 04/18/18   [place], Modena Nunnery, MD  pantoprazole (PROTONIX) 40 MG tablet TAKE 1 TABLET BY MOUTH ONCE DAILY 08/16/17   [name], MD    Family History Family History  Problem Relation Age of Onset  . Cancer Father   . Cancer Mother        died more of old age at age 78  . Colon cancer Neg Hx   . Esophageal cancer Neg Hx   . Prostate cancer Neg Hx   . Rectal cancer Neg Hx   . Stomach cancer Neg Hx     Social History Social History   Tobacco Use  . Smoking status: Former Smoker    Years: 57.00    Types: Cigarettes    Last attempt to quit: 05/25/2007    Years since quitting: 10.9  . Smokeless tobacco: Never Used  Substance Use Topics  . Alcohol use: No    Alcohol/week: 0.0 standard drinks  . Drug use: No     Allergies   Patient has no known allergies.   Review of Systems Review of Systems  Constitutional: Negative for chills and fever.  HENT: Negative for ear pain and sore throat.   Eyes: Negative for pain and visual disturbance.  Respiratory: Negative for cough and shortness of breath.   Cardiovascular: Negative for chest pain and palpitations.  Gastrointestinal: Negative for abdominal pain and vomiting.  Genitourinary: Negative for dysuria and hematuria.  Musculoskeletal: Negative for arthralgias and back pain.  Skin: Positive for color change, rash and wound.  Neurological: Negative for seizures, syncope and headaches.  All other systems reviewed and are negative.    Physical Exam Updated Vital Signs BP (!) 130/103 (BP Location: Left Arm)   Pulse (!) 108   Temp 98.5 F (36.9 C) (Oral)   Resp 16   SpO2 95%   Physical Exam  Constitutional: He appears well-developed and well-nourished.  HENT:  Head: Normocephalic and atraumatic.  Eyes: Pupils are equal, round, and reactive to light. Conjunctivae and EOM are normal.  Neck: Normal range of motion. Neck supple.  Cardiovascular: Normal rate, regular rhythm, normal heart sounds and  intact distal pulses.  No murmur heard. Pulmonary/Chest: Effort normal and breath sounds normal. No respiratory distress.  Abdominal: Soft. There is no tenderness.  Musculoskeletal: Normal range of motion. He exhibits no edema, tenderness or deformity.  Neurological: He is alert.  Skin: Skin is warm and dry.  Bilateral arms are red, warm, irritated.  Redness extends from the shoulder down to the wrists bilaterally.  There is mild swelling to both arms that is slightly worse than the right.  Concerning for cellulitis  Psychiatric: He has a normal mood and affect.  Nursing note and vitals reviewed.    ED Treatments / Results  Labs (all labs ordered  are listed, but only abnormal results are displayed) Labs Reviewed - No data to display  EKG None  Radiology No results found.  Procedures Procedures (including critical care time)  Medications Ordered in ED Medications - No data to display   Initial Impression / Assessment and Plan / ED Course  I have reviewed the triage vital signs and the nursing notes.  Pertinent labs & imaging results that were available during my care of the patient were reviewed by me and considered in my medical decision making (see chart for details).     Brendan Hopkins is an 82 year old male with history of COPD, skin cancer currently undergoing topical treatment who presents to the ED with bilateral arm redness and swelling.  Patient with overall unremarkable vitals.  No fever.  Patient with redness and swelling that started several weeks ago since starting new topical treatment for skin cancer to both arms.  Patient called dermatologist for appointment today as he has noticed worsening redness to both arms with slightly more swollen in the right arm.  Patient has normal pulses in both arms.  Both arms appear to be irritated, red, warm.  Possible side effect of medication versus possible cellulitis.  It appears to be likely a slow process as symptoms have  been ongoing for several weeks.  Slightly worse over the last 2 days according to the patient.  Will start clindamycin for possible cellulitis. Rash is not concerning for SJS/TEN at this time.  However recommend close follow-up with dermatologist to discuss whether this is side effect from medication he has been given as well.  No concern for blood clot or deeper space infection.  Recommend 48-hour follow-up with dermatology or primary care doctor.  Told to return to the ED if symptoms worsen.  Patient discharged in good condition.  Understands return precautions.  This chart was dictated using voice recognition software.  Despite best efforts to proofread,  errors can occur which can change the documentation meaning.   Final Clinical Impressions(s) / ED Diagnoses   Final diagnoses:  Cellulitis of right upper extremity    ED Discharge Orders         Ordered    clindamycin (CLEOCIN) 150 MG capsule  3 times daily     05/02/18 0807           Lennice Sites, DO 05/02/18 6834

## 2018-05-02 NOTE — Discharge Instructions (Signed)
Follow-up with dermatologist in 48 hours, return to ED if symptoms worsen.  Possibly drug side effect versus skin infection.

## 2018-05-02 NOTE — ED Triage Notes (Signed)
Pt here from home c/o right arm swelling x2 days. Positive radial pulses bilaterally. Endorses chills and shortness of breath x2 days as well.

## 2018-06-19 ENCOUNTER — Other Ambulatory Visit: Payer: Self-pay | Admitting: Family Medicine

## 2018-06-19 MED ORDER — OXYCODONE-ACETAMINOPHEN 10-325 MG PO TABS: 1.0000 | ORAL_TABLET | Freq: Two times a day (BID) | ORAL | 0 refills | Status: DC

## 2018-06-19 NOTE — Telephone Encounter (Signed)
Refill on oxycodone to [facility] °

## 2018-06-19 NOTE — Telephone Encounter (Signed)
Patient requesting a refill on Oxycodone     LOV: 02/17/18  LRF:     04/18/18

## 2018-07-07 DIAGNOSIS — C44629 Squamous cell carcinoma of skin of left upper limb, including shoulder: Secondary | ICD-10-CM | POA: Diagnosis not present

## 2018-07-07 DIAGNOSIS — Z85828 Personal history of other malignant neoplasm of skin: Secondary | ICD-10-CM | POA: Diagnosis not present

## 2018-07-07 DIAGNOSIS — D692 Other nonthrombocytopenic purpura: Secondary | ICD-10-CM | POA: Diagnosis not present

## 2018-07-07 DIAGNOSIS — L570 Actinic keratosis: Secondary | ICD-10-CM | POA: Diagnosis not present

## 2018-07-07 DIAGNOSIS — D485 Neoplasm of uncertain behavior of skin: Secondary | ICD-10-CM | POA: Diagnosis not present

## 2018-07-07 DIAGNOSIS — D225 Melanocytic nevi of trunk: Secondary | ICD-10-CM | POA: Diagnosis not present

## 2018-07-07 DIAGNOSIS — L821 Other seborrheic keratosis: Secondary | ICD-10-CM | POA: Diagnosis not present

## 2018-07-07 DIAGNOSIS — L814 Other melanin hyperpigmentation: Secondary | ICD-10-CM | POA: Diagnosis not present

## 2018-07-12 DIAGNOSIS — H2513 Age-related nuclear cataract, bilateral: Secondary | ICD-10-CM | POA: Diagnosis not present

## 2018-07-20 ENCOUNTER — Telehealth: Payer: Self-pay | Admitting: Family Medicine

## 2018-07-20 NOTE — Telephone Encounter (Signed)
Pt states he has gone to several pharmacies looking for the mask they are showing on the news that everyone needs to purchase for the coronovirus. Please call pt to advise where he can go to purchase the mask.  [phone]

## 2018-07-24 NOTE — Telephone Encounter (Signed)
I do not know will have to call around to different places to find

## 2018-07-24 NOTE — Telephone Encounter (Signed)
Pt aware.

## 2018-08-07 DIAGNOSIS — L905 Scar conditions and fibrosis of skin: Secondary | ICD-10-CM | POA: Diagnosis not present

## 2018-08-07 DIAGNOSIS — C44629 Squamous cell carcinoma of skin of left upper limb, including shoulder: Secondary | ICD-10-CM | POA: Diagnosis not present

## 2018-08-09 ENCOUNTER — Other Ambulatory Visit: Payer: Self-pay | Admitting: Family Medicine

## 2018-08-17 ENCOUNTER — Other Ambulatory Visit: Payer: Self-pay | Admitting: Family Medicine

## 2018-08-17 NOTE — Telephone Encounter (Signed)
Ok to refill??  Last office visit 9/27/22019.  Last refill 06/19/2018.

## 2018-08-17 NOTE — Telephone Encounter (Signed)
Patient called in requesting a refill on his oxyCODONE-acetaminophen (PERCOCET) 10-325 MG tablet  Called into [facility] at [facility]. He will be out of medication this weekend.  CB# [phone]

## 2018-08-18 MED ORDER — OXYCODONE-ACETAMINOPHEN 10-325 MG PO TABS: 1.0000 | ORAL_TABLET | Freq: Two times a day (BID) | ORAL | 0 refills | Status: DC

## 2018-08-21 ENCOUNTER — Telehealth: Payer: Self-pay | Admitting: Family Medicine

## 2018-08-21 DIAGNOSIS — C44629 Squamous cell carcinoma of skin of left upper limb, including shoulder: Secondary | ICD-10-CM | POA: Diagnosis not present

## 2018-08-21 NOTE — Telephone Encounter (Signed)
Wants to ask you a question about his heart, I offered him to talk to triage but he refused.

## 2018-08-21 NOTE — Telephone Encounter (Signed)
Called and spoke to pt - he had an episode that woke him up with his heart beating hard and loud and was concerned. NO cp, arm pain, sob, n,v,d, fever. Suggested pt be seen and apt made.

## 2018-08-22 ENCOUNTER — Encounter: Payer: Self-pay | Admitting: Family Medicine

## 2018-08-22 ENCOUNTER — Ambulatory Visit (INDEPENDENT_AMBULATORY_CARE_PROVIDER_SITE_OTHER): Payer: Medicare HMO | Admitting: Family Medicine

## 2018-08-22 ENCOUNTER — Other Ambulatory Visit: Payer: Self-pay

## 2018-08-22 VITALS — BP 118/90 | HR 80 | Temp 98.0°F | Resp 18 | Ht 71.0 in | Wt 232.0 lb

## 2018-08-22 DIAGNOSIS — R002 Palpitations: Secondary | ICD-10-CM

## 2018-08-22 NOTE — Progress Notes (Signed)
Subjective:    Patient ID: Brendan Hopkins, male    DOB: 1935-07-14, 83 y.o.   MRN: [id number]  HPI  Sunday night, the patient was awoken from sleep by pounding heartbeat.  He denies any irregular heartbeat.  It was beating normally and regularly however it seemed to be pounding.  Patient states that it even seem to shake the bed that he was laying on.  He was laying on his left side.  When he rolled over onto his back it seemed to improve.  However when he rolled back to his left side, the pounding heartbeat continued.  He called and made this appointment.  It did not happen Monday night.  He denies any chest pain.  There was no chest pain during the event.  There was no shortness of breath.  There was no syncope or near syncope.  He denies any irregular heartbeat since that time.  He denies any palpitations.  He denies any angina.  He denies any pleurisy or shortness of breath or tearing chest pain.  Patient states he feels fine now.  He just wanted it checked out. Past Medical History:  Diagnosis Date  . Arthritis    RIGHT HIP, HANDS, BACK  . Cataract   . Cervical radiculopathy    severe C3-4 neuroforaminal stenosis, C5-6 left synovial cyst compressing left nerve root.  . Colon polyps    2005  . COPD (chronic obstructive pulmonary disease) ([place])    SMOKED FOR 48 YRS  . Diverticulosis    2005  . Fatty liver   . GERD (gastroesophageal reflux disease)   . Hypertension   . Pain    RIGHT HIP AND BACK  . Personal history of colonic polyps-adenoma and sessile serrated polyp 07/15/2010  . RBBB    POSS HX AF IN PAST- PAC'S  . Shortness of breath    WITH EXERTION ONLY  . Stroke ([place])    FEW YEARS AGO - PT SAYS "HEAT STROKE" X 2 - BUT NO PHYSICAL DEFICITS  . TIA (transient ischemic attack)    Past Surgical History:  Procedure Laterality Date  . COLONOSCOPY    . TOTAL HIP ARTHROPLASTY Right 01/09/2013   Procedure: RIGHT TOTAL HIP ARTHROPLASTY ANTERIOR APPROACH;  Surgeon: [name], MD;  Location: [facility];  Service: Orthopedics;  Laterality: Right;   Current Outpatient Medications on File Prior to Visit  Medication Sig Dispense Refill  . amLODipine (NORVASC) 5 MG tablet TAKE 1 TABLET BY MOUTH ONCE DAILY 90 tablet 3  . aspirin EC 81 MG tablet Take 81 mg by mouth daily.    . fish oil-omega-3 fatty acids 1000 MG capsule Take 1 g by mouth daily.    . hydrochlorothiazide (HYDRODIURIL) 25 MG tablet Take 1 tablet (25 mg total) by mouth daily. 90 tablet 3  . meclizine (ANTIVERT) 25 MG tablet TAKE ONE TABLET BY MOUTH THREE TIMES DAILY AS NEEDED FOR DIZZINESS 30 tablet 2  . Multiple Vitamin (MULTIVITAMIN WITH MINERALS) TABS Take 1 tablet by mouth daily.    Marland Kitchen oxyCODONE-acetaminophen (PERCOCET) 10-325 MG tablet Take 1 tablet by mouth 2 (two) times daily at 10 AM and 5 PM. 60 tablet 0  . pantoprazole (PROTONIX) 40 MG tablet Take 1 tablet by mouth once daily 90 tablet 0   No current facility-administered medications on file prior to visit.    No Known Allergies Social History   Socioeconomic History  . Marital status: Married    Spouse name: Not on file  .  Number of children: Not on file  . Years of education: Not on file  . Highest education level: Not on file  Occupational History  . Occupation: Retired  Scientific laboratory technician  . Financial resource strain: Not on file  . Food insecurity:    Worry: Not on file    Inability: Not on file  . Transportation needs:    Medical: Not on file    Non-medical: Not on file  Tobacco Use  . Smoking status: Former Smoker    Years: 57.00    Types: Cigarettes    Last attempt to quit: 05/25/2007    Years since quitting: 11.2  . Smokeless tobacco: Never Used  Substance and Sexual Activity  . Alcohol use: No    Alcohol/week: 0.0 standard drinks  . Drug use: No  . Sexual activity: Not on file  Lifestyle  . Physical activity:    Days per week: Not on file    Minutes per session: Not on file  . Stress: Not on file  Relationships  .  Social connections:    Talks on phone: Not on file    Gets together: Not on file    Attends religious service: Not on file    Active member of club or organization: Not on file    Attends meetings of clubs or organizations: Not on file    Relationship status: Not on file  . Intimate partner violence:    Fear of current or ex partner: Not on file    Emotionally abused: Not on file    Physically abused: Not on file    Forced sexual activity: Not on file  Other Topics Concern  . 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 Arthritis    RIGHT HIP, HANDS, BACK  . Cataract   . Cervical radiculopathy    severe C3-4 neuroforaminal stenosis, C5-6 left synovial cyst compressing left nerve root.  . Colon polyps    2005  . COPD (chronic obstructive pulmonary disease) ([place])    SMOKED FOR 15 YRS  . Diverticulosis    2005  . Fatty liver   . GERD (gastroesophageal reflux disease)   . Hypertension   . Pain    RIGHT HIP AND BACK  . Personal history of colonic polyps-adenoma and sessile serrated polyp 07/15/2010  . RBBB    POSS HX AF IN PAST- PAC'S  . Shortness of breath    WITH EXERTION ONLY  . Stroke ([place])    FEW YEARS AGO - PT SAYS "HEAT STROKE" X 2 - BUT NO PHYSICAL DEFICITS  . TIA (transient ischemic attack)    Past Surgical History:  Procedure Laterality Date  . COLONOSCOPY    . TOTAL HIP ARTHROPLASTY Right 01/09/2013   Procedure: RIGHT TOTAL HIP ARTHROPLASTY ANTERIOR APPROACH;  Surgeon: [name], MD;  Location: [facility];  Service: Orthopedics;  Laterality: Right;   Current Outpatient Medications on File Prior to Visit  Medication Sig Dispense Refill  . amLODipine (NORVASC) 5 MG tablet TAKE 1 TABLET BY MOUTH ONCE DAILY 90 tablet 3  . aspirin EC 81 MG tablet Take 81 mg by mouth daily.    . fish oil-omega-3 fatty acids 1000 MG capsule Take 1 g by mouth daily.    .  hydrochlorothiazide (HYDRODIURIL) 25 MG tablet Take 1 tablet (25 mg total) by mouth daily. 90 tablet 3  .  meclizine (ANTIVERT) 25 MG tablet TAKE ONE TABLET BY MOUTH THREE TIMES DAILY AS NEEDED FOR DIZZINESS 30 tablet 2  . Multiple Vitamin (MULTIVITAMIN WITH MINERALS) TABS Take 1 tablet by mouth daily.    Marland Kitchen oxyCODONE-acetaminophen (PERCOCET) 10-325 MG tablet Take 1 tablet by mouth 2 (two) times daily at 10 AM and 5 PM. 60 tablet 0  . pantoprazole (PROTONIX) 40 MG tablet Take 1 tablet by mouth once daily 90 tablet 0   No current facility-administered medications on file prior to visit.    No Known Allergies Social History   Socioeconomic History  . Marital status: Married    Spouse name: Not on file  . Number of children: Not on file  . Years of education: Not on file  . Highest education level: Not on file  Occupational History  . Occupation: Retired  Scientific laboratory technician  . Financial resource strain: Not on file  . Food insecurity:    Worry: Not on file    Inability: Not on file  . Transportation needs:    Medical: Not on file    Non-medical: Not on file  Tobacco Use  . Smoking status: Former Smoker    Years: 57.00    Types: Cigarettes    Last attempt to quit: 05/25/2007    Years since quitting: 11.2  . Smokeless tobacco: Never Used  Substance and Sexual Activity  . Alcohol use: No    Alcohol/week: 0.0 standard drinks  . Drug use: No  . Sexual activity: Not on file  Lifestyle  . Physical activity:    Days per week: Not on file    Minutes per session: Not on file  . Stress: Not on file  Relationships  . Social connections:    Talks on phone: Not on file    Gets together: Not on file    Attends religious service: Not on file    Active member of club or organization: Not on file    Attends meetings of clubs or organizations: Not on file    Relationship status: Not on file  . Intimate partner violence:    Fear of current or ex partner: Not on file    Emotionally abused: Not on  file    Physically abused: Not on file    Forced sexual activity: Not on file  Other Topics Concern  . 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      Objective:   Physical Exam  Constitutional: He appears well-developed and well-nourished. No distress.  Neck: No JVD present. No thyromegaly present.  Cardiovascular: Normal rate, regular rhythm, normal heart sounds and intact distal pulses. Exam reveals no gallop and no friction rub.  No murmur heard. Pulmonary/Chest: Effort normal and breath sounds normal. No respiratory distress. He has no wheezes. He has no rales.  Abdominal: Soft. Bowel sounds are normal. He exhibits no distension. There is no abdominal tenderness.  Musculoskeletal:        General: No edema.  Lymphadenopathy:    He has no cervical adenopathy.  Skin: He is not diaphoretic.  Vitals reviewed.   EKG today shows normal sinus rhythm.  He does have a first-degree AV block with a PR interval greater than 200.  He also has a right bundle branch block and a left axis deviation.  Although this is essentially unchanged  from his previous EKG in 2017.  There is also mention of a possible anterior lateral infarct due to Q waves in the inferior and lateral leads.  This was present as well in 2017.      Assessment & Plan:  Pounding heartbeat - Plan: EKG 12-Lead  Patient denies any angina.  He denies any shortness of breath.  He denies any syncope or palpitations or presyncope.  There is no indication for hospitalization.  His EKG is essentially unchanged from 2017 and his physical exam is normal.  I believe that the reason he felt his pounding heartbeat was because he was laying on his left side which was the way he was sleeping.  When he rolled off of his left side, the symptoms subsided.  If this continues, we could consider possibly switching  to a beta-blocker to see if this would help ameliorate some of the symptoms if this becomes persistent however I hesitate to do that given his prolonged PR interval first-degree AV block.  Therefore the patient elects to make no changes and simply monitor this clinically at the present time

## 2018-10-18 ENCOUNTER — Other Ambulatory Visit: Payer: Self-pay | Admitting: Family Medicine

## 2018-10-18 NOTE — Telephone Encounter (Signed)
Patient requesting refill on oxycodone  [facility]

## 2018-10-18 NOTE — Telephone Encounter (Signed)
Patient requesting a refill on Oxycodone     LOV:  08/22/2018  LRF:     08/18/18

## 2018-10-19 MED ORDER — OXYCODONE-ACETAMINOPHEN 10-325 MG PO TABS: 1.0000 | ORAL_TABLET | Freq: Two times a day (BID) | ORAL | 0 refills | Status: DC

## 2018-10-30 ENCOUNTER — Telehealth: Payer: Self-pay | Admitting: Family Medicine

## 2018-10-30 MED ORDER — HYDROCHLOROTHIAZIDE 25 MG PO TABS: 25.0000 mg | ORAL_TABLET | Freq: Every day | ORAL | 3 refills | Status: DC

## 2018-10-30 NOTE — Telephone Encounter (Signed)
Refill on hctz to [facility]

## 2018-10-30 NOTE — Telephone Encounter (Signed)
Medication called/sent to requested pharmacy  

## 2018-11-07 ENCOUNTER — Other Ambulatory Visit: Payer: Self-pay | Admitting: Family Medicine

## 2018-11-07 MED ORDER — PANTOPRAZOLE SODIUM 40 MG PO TBEC: 40.0000 mg | DELAYED_RELEASE_TABLET | Freq: Every day | ORAL | 3 refills | Status: DC

## 2018-12-18 ENCOUNTER — Other Ambulatory Visit: Payer: Self-pay | Admitting: Family Medicine

## 2018-12-18 MED ORDER — OXYCODONE-ACETAMINOPHEN 10-325 MG PO TABS: 1.0000 | ORAL_TABLET | Freq: Two times a day (BID) | ORAL | 0 refills | Status: DC

## 2018-12-18 NOTE — Telephone Encounter (Signed)
Refill on oxycodone to [facility].

## 2018-12-18 NOTE — Telephone Encounter (Signed)
Patient requesting a refill on Oxycodone     LOV: 08/22/18  LRF:     10/19/18

## 2018-12-21 ENCOUNTER — Encounter: Payer: Self-pay | Admitting: Internal Medicine

## 2019-01-24 ENCOUNTER — Other Ambulatory Visit: Payer: Self-pay

## 2019-01-25 ENCOUNTER — Ambulatory Visit (INDEPENDENT_AMBULATORY_CARE_PROVIDER_SITE_OTHER): Payer: Medicare HMO | Admitting: Family Medicine

## 2019-01-25 VITALS — BP 120/70 | HR 63 | Temp 97.8°F | Resp 18 | Ht 71.0 in | Wt 234.8 lb

## 2019-01-25 DIAGNOSIS — Z23 Encounter for immunization: Secondary | ICD-10-CM | POA: Diagnosis not present

## 2019-01-25 DIAGNOSIS — Z8673 Personal history of transient ischemic attack (TIA), and cerebral infarction without residual deficits: Secondary | ICD-10-CM

## 2019-01-25 DIAGNOSIS — Z0001 Encounter for general adult medical examination with abnormal findings: Secondary | ICD-10-CM | POA: Diagnosis not present

## 2019-01-25 DIAGNOSIS — Z0000 Encounter for general adult medical examination without abnormal findings: Secondary | ICD-10-CM

## 2019-01-25 DIAGNOSIS — I10 Essential (primary) hypertension: Secondary | ICD-10-CM

## 2019-01-25 DIAGNOSIS — R0602 Shortness of breath: Secondary | ICD-10-CM | POA: Diagnosis not present

## 2019-01-25 DIAGNOSIS — K582 Mixed irritable bowel syndrome: Secondary | ICD-10-CM | POA: Diagnosis not present

## 2019-01-25 DIAGNOSIS — R7303 Prediabetes: Secondary | ICD-10-CM

## 2019-01-25 DIAGNOSIS — E7800 Pure hypercholesterolemia, unspecified: Secondary | ICD-10-CM | POA: Diagnosis not present

## 2019-01-25 MED ORDER — TERBINAFINE HCL 250 MG PO TABS: 250.0000 mg | ORAL_TABLET | Freq: Every day | ORAL | 0 refills | Status: DC

## 2019-01-25 NOTE — Progress Notes (Signed)
Subjective:    Patient ID: Brendan Hopkins, male    DOB: [birth year], 83 y.o.   MRN: [id number]  HPI Patient is here today for complete physical exam. He denies any concerns.  His colonoscopy was last performed in 2015.  Due to his age they do not recommend a repeat colonoscopy.  Also due to his age I would not recommend a PSA.  Patient denies any complaints or concerns regarding his prostate.  He denies any blood in his stool or melena.  He denies any issues with depression or memory loss or trouble performing his ADLs.  In fact he recently helped his son build a deck.  However while building the deck, he noticed shortness of breath and dyspnea on exertion beyond what he experienced before.  He denies any chest pain.  He denied any palpitations.  He denies any orthopnea or paroxysmal nocturnal dyspnea although he has noticed some increased swelling in both of his legs left greater than right.  He is still able to do considerable amount of physical work to be 83 years old however his exercise tolerance has diminished in the last year. Immunization History  Administered Date(s) Administered  . Influenza Split 02/15/2011  . Influenza, High Dose Seasonal PF 02/17/2018  . Influenza,inj,Quad PF,6+ Mos 04/09/2013, 02/12/2014, 03/14/2015, 03/04/2016, 03/02/2017  . Pneumococcal Conjugate-13 04/09/2013  . Pneumococcal Polysaccharide-23 12/19/2014  . Td 09/10/2003    Past Medical History:  Diagnosis Date  . Arthritis    RIGHT HIP, HANDS, BACK  . Cataract   . Cervical radiculopathy    severe C3-4 neuroforaminal stenosis, C5-6 left synovial cyst compressing left nerve root.  . Colon polyps    2005  . COPD (chronic obstructive pulmonary disease) ([place])    SMOKED FOR 14 YRS  . Diverticulosis    2005  . Fatty liver   . GERD (gastroesophageal reflux disease)   . Hypertension   . Pain    RIGHT HIP AND BACK  . Personal history of colonic polyps-adenoma and sessile serrated polyp 07/15/2010  . RBBB     POSS HX AF IN PAST- PAC'S  . Shortness of breath    WITH EXERTION ONLY  . Stroke ([place])    FEW YEARS AGO - PT SAYS "HEAT STROKE" X 2 - BUT NO PHYSICAL DEFICITS  . TIA (transient ischemic attack)    Past Surgical History:  Procedure Laterality Date  . COLONOSCOPY    . TOTAL HIP ARTHROPLASTY Right 01/09/2013   Procedure: RIGHT TOTAL HIP ARTHROPLASTY ANTERIOR APPROACH;  Surgeon: [name], MD;  Location: [facility];  Service: Orthopedics;  Laterality: Right;   Current Outpatient Medications on File Prior to Visit  Medication Sig Dispense Refill  . amLODipine (NORVASC) 5 MG tablet TAKE 1 TABLET BY MOUTH ONCE DAILY 90 tablet 3  . aspirin EC 81 MG tablet Take 81 mg by mouth daily.    . fish oil-omega-3 fatty acids 1000 MG capsule Take 1 g by mouth daily.    . hydrochlorothiazide (HYDRODIURIL) 25 MG tablet Take 1 tablet (25 mg total) by mouth daily. 90 tablet 3  . meclizine (ANTIVERT) 25 MG tablet TAKE ONE TABLET BY MOUTH THREE TIMES DAILY AS NEEDED FOR DIZZINESS 30 tablet 2  . Multiple Vitamin (MULTIVITAMIN WITH MINERALS) TABS Take 1 tablet by mouth daily.    Marland Kitchen oxyCODONE-acetaminophen (PERCOCET) 10-325 MG tablet Take 1 tablet by mouth 2 (two) times daily at 10 AM and 5 PM. 60 tablet 0  . pantoprazole (PROTONIX) 40  MG tablet Take 1 tablet (40 mg total) by mouth daily. 90 tablet 3   No current facility-administered medications on file prior to visit.    No Known Allergies Social History   Socioeconomic History  . Marital status: Married    Spouse name: Not on file  . Number of children: Not on file  . Years of education: Not on file  . Highest education level: Not on file  Occupational History  . Occupation: Retired  Scientific laboratory technician  . Financial resource strain: Not on file  . Food insecurity    Worry: Not on file    Inability: Not on file  . Transportation needs    Medical: Not on file    Non-medical: Not on file  Tobacco Use  . Smoking status: Former Smoker    Years: 57.00     Types: Cigarettes    Quit date: 05/25/2007    Years since quitting: 11.6  . Smokeless tobacco: Never Used  Substance and Sexual Activity  . Alcohol use: No    Alcohol/week: 0.0 standard drinks  . Drug use: No  . Sexual activity: Not on file  Lifestyle  . Physical activity    Days per week: Not on file    Minutes per session: Not on file  . Stress: Not on file  Relationships  . Social Herbalist on phone: Not on file    Gets together: Not on file    Attends religious service: Not on file    Active member of club or organization: Not on file    Attends meetings of clubs or organizations: Not on file    Relationship status: Not on file  . Intimate partner violence    Fear of current or ex partner: Not on file    Emotionally abused: Not on file    Physically abused: Not on file    Forced sexual activity: Not on file  Other Topics Concern  . 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 Cancer Father   . Cancer Mother        died more of old age at age 12  . Colon cancer Neg Hx   . Esophageal cancer Neg Hx   . Prostate cancer Neg Hx   . Rectal cancer Neg Hx   . Stomach cancer Neg Hx       Review of Systems  All other systems reviewed and are negative.      Objective:   Physical Exam  Constitutional: He is oriented to person, place, and time. He appears well-developed and well-nourished. No distress.  HENT:  Head: Normocephalic and atraumatic.  Right Ear: External ear normal.  Left Ear: External ear normal.  Nose: Nose normal.  Mouth/Throat: Oropharynx is clear and moist. No oropharyngeal exudate.  Eyes: Pupils are equal, round, and reactive to light. Conjunctivae and EOM are normal. Right eye exhibits no discharge. Left eye exhibits no discharge. No scleral icterus.  Neck: Normal range of motion. Neck supple. No JVD present. No tracheal  deviation present. No thyromegaly present.  Cardiovascular: Normal rate, regular rhythm, normal heart sounds and intact distal pulses. Exam reveals no gallop and no friction rub.  No murmur heard. Pulmonary/Chest: Effort normal and breath sounds normal. No stridor. No respiratory distress. He has no wheezes. He has no rales. He exhibits no tenderness.  Abdominal: Soft. Bowel sounds are normal. He exhibits no distension and no mass. There is no abdominal tenderness. There is no rebound and no guarding.  Musculoskeletal: Normal range of motion.        General: No tenderness or edema.  Lymphadenopathy:    He has no cervical adenopathy.  Neurological: He is alert and oriented to person, place, and time. He has normal reflexes. No cranial nerve deficit. He exhibits normal muscle tone. Coordination normal.  Skin: Skin is warm. No rash noted. He is not diaphoretic. No erythema. No pallor.  Psychiatric: He has a normal mood and affect. His behavior is normal. Judgment and thought content normal.  Vitals reviewed.  Patient has a cerumen impaction in both ears that he would like removed       Assessment & Plan:  General medical exam - Plan: CBC with Differential/Platelet, COMPLETE METABOLIC PANEL WITH GFR, Lipid panel  Benign essential HTN - Plan: CBC with Differential/Platelet, COMPLETE METABOLIC PANEL WITH GFR, Lipid panel  Pure hypercholesterolemia - Plan: CBC with Differential/Platelet, COMPLETE METABOLIC PANEL WITH GFR, Lipid panel  Irritable bowel syndrome with both constipation and diarrhea  History of TIA (transient ischemic attack) - Plan: CBC with Differential/Platelet, COMPLETE METABOLIC PANEL WITH GFR, Lipid panel  Prediabetes - Plan: Hemoglobin A1c  SOB (shortness of breath) - Plan: DG Chest 2 View, ECHOCARDIOGRAM COMPLETE  Patient's physical exam today is normal.  He received his flu shot.  I recommended shin Grix when available.  Blood pressure is controlled.  I recommended  returning in the morning fasting for CBC, CMP, fasting lipid panel.  I would like his LDL cholesterol to be below 100 given his history of a TIA.  Given his history of prediabetes I will check a hemoglobin A1c.  Given his dyspnea on exertion I will check a chest x-ray even his remote smoking history.  I will also check an echocardiogram and check a CBC to rule out anemia.  If all of this is normal, I will assume that his shortness of breath could be due to declining physical conditioning secondary to age.  Patient otherwise is doing exceptionally well.  Depression screen, fall risk screen, is completely normal

## 2019-01-26 ENCOUNTER — Other Ambulatory Visit: Payer: Self-pay

## 2019-01-26 ENCOUNTER — Ambulatory Visit
Admission: RE | Admit: 2019-01-26 | Discharge: 2019-01-26 | Disposition: A | Discharge status: Home / Self Care | Payer: Medicare HMO | Source: Ambulatory Visit | Attending: Family Medicine | Admitting: Family Medicine

## 2019-01-26 ENCOUNTER — Other Ambulatory Visit: Payer: Medicare HMO

## 2019-01-26 DIAGNOSIS — E7800 Pure hypercholesterolemia, unspecified: Secondary | ICD-10-CM | POA: Diagnosis not present

## 2019-01-26 DIAGNOSIS — R7303 Prediabetes: Secondary | ICD-10-CM | POA: Diagnosis not present

## 2019-01-26 DIAGNOSIS — R0602 Shortness of breath: Secondary | ICD-10-CM | POA: Diagnosis not present

## 2019-01-26 DIAGNOSIS — I10 Essential (primary) hypertension: Secondary | ICD-10-CM | POA: Diagnosis not present

## 2019-01-26 DIAGNOSIS — Z0000 Encounter for general adult medical examination without abnormal findings: Secondary | ICD-10-CM | POA: Diagnosis not present

## 2019-01-26 DIAGNOSIS — Z8673 Personal history of transient ischemic attack (TIA), and cerebral infarction without residual deficits: Secondary | ICD-10-CM | POA: Diagnosis not present

## 2019-01-27 LAB — COMPLETE METABOLIC PANEL WITH GFR
AG Ratio: 1.6 (calc) (ref 1.0–2.5)
ALT: 15 U/L (ref 9–46)
AST: 20 U/L (ref 10–35)
Albumin: 4.1 g/dL (ref 3.6–5.1)
Alkaline phosphatase (APISO): 52 U/L (ref 35–144)
BUN: 25 mg/dL (ref 7–25)
CO2: 25 mmol/L (ref 20–32)
Calcium: 9.3 mg/dL (ref 8.6–10.3)
Chloride: 101 mmol/L (ref 98–110)
Creat: 1.04 mg/dL (ref 0.70–1.11)
GFR, Est African American: 78 mL/min/{1.73_m2} (ref >=60)
GFR, Est Non African American: 67 mL/min/{1.73_m2} (ref >=60)
Globulin: 2.5 g/dL (calc) (ref 1.9–3.7)
Glucose, Bld: 107 mg/dL — ABNORMAL HIGH (ref 65–99)
Potassium: 4.2 mmol/L (ref 3.5–5.3)
Sodium: 137 mmol/L (ref 135–146)
Total Bilirubin: 1 mg/dL (ref 0.2–1.2)
Total Protein: 6.6 g/dL (ref 6.1–8.1)

## 2019-01-27 LAB — HEMOGLOBIN A1C
Hgb A1c MFr Bld: 6 % of total Hgb — ABNORMAL HIGH (ref <5.7)
Mean Plasma Glucose: 126 (calc)
eAG (mmol/L): 7 (calc)

## 2019-01-27 LAB — LIPID PANEL
Cholesterol: 175 mg/dL (ref <200)
HDL: 46 mg/dL (ref >=40)
LDL Cholesterol (Calc): 107 mg/dL (calc) — ABNORMAL HIGH
Non-HDL Cholesterol (Calc): 129 mg/dL (calc) (ref <130)
Total CHOL/HDL Ratio: 3.8 (calc) (ref <5.0)
Triglycerides: 121 mg/dL (ref <150)

## 2019-01-27 LAB — CBC WITH DIFFERENTIAL/PLATELET
Absolute Monocytes: 635 cells/uL (ref 200–950)
Basophils Absolute: 37 cells/uL (ref 0–200)
Basophils Relative: 0.5 %
Eosinophils Absolute: 241 cells/uL (ref 15–500)
Eosinophils Relative: 3.3 %
HCT: 46.6 % (ref 38.5–50.0)
Hemoglobin: 16.1 g/dL (ref 13.2–17.1)
Lymphs Abs: 1497 cells/uL (ref 850–3900)
MCH: 33.2 pg — ABNORMAL HIGH (ref 27.0–33.0)
MCHC: 34.5 g/dL (ref 32.0–36.0)
MCV: 96.1 fL (ref 80.0–100.0)
MPV: 10.5 fL (ref 7.5–12.5)
Monocytes Relative: 8.7 %
Neutro Abs: 4891 cells/uL (ref 1500–7800)
Neutrophils Relative %: 67 %
Platelets: 191 10*3/uL (ref 140–400)
RBC: 4.85 10*6/uL (ref 4.20–5.80)
RDW: 12.3 % (ref 11.0–15.0)
Total Lymphocyte: 20.5 %
WBC: 7.3 10*3/uL (ref 3.8–10.8)

## 2019-02-02 ENCOUNTER — Other Ambulatory Visit: Payer: Self-pay

## 2019-02-02 ENCOUNTER — Ambulatory Visit (HOSPITAL_COMMUNITY): Payer: Medicare HMO | Attending: Cardiology

## 2019-02-02 DIAGNOSIS — R0602 Shortness of breath: Secondary | ICD-10-CM | POA: Diagnosis not present

## 2019-02-14 ENCOUNTER — Telehealth: Payer: Self-pay | Admitting: Family Medicine

## 2019-02-14 NOTE — Telephone Encounter (Signed)
Patient requesting a refill on Oxycodone     LOV: 01/25/2019  LRF:    12/18/2018

## 2019-02-14 NOTE — Telephone Encounter (Signed)
Patient left a message requesting a refill on his oxycodone.  CB# [phone]

## 2019-02-15 MED ORDER — OXYCODONE-ACETAMINOPHEN 10-325 MG PO TABS: 1.0000 | ORAL_TABLET | Freq: Two times a day (BID) | ORAL | 0 refills | Status: DC

## 2019-02-15 NOTE — Telephone Encounter (Signed)
I refilled his oxycodone

## 2019-02-26 ENCOUNTER — Telehealth: Payer: Self-pay | Admitting: Family Medicine

## 2019-02-26 NOTE — Telephone Encounter (Signed)
Patient needs refill on his terbinafine [facility]

## 2019-02-26 NOTE — Telephone Encounter (Signed)
Requesting refill on Terbinafine - ok to refill?   LOV: 01/25/19  LRF:  01/25/19

## 2019-02-27 NOTE — Telephone Encounter (Signed)
What is he using it for?  We only use it for 3 months and need to monitor lft's monthly while on it.

## 2019-02-28 NOTE — Telephone Encounter (Signed)
Called and spoke to pt and he states that you give it to him for some fingernail fungus. He states that it is better but not gone and thinks he needs another month and it should be gone by then.

## 2019-03-01 ENCOUNTER — Other Ambulatory Visit: Payer: Self-pay | Admitting: Family Medicine

## 2019-03-01 MED ORDER — TERBINAFINE HCL 250 MG PO TABS: 250.0000 mg | ORAL_TABLET | Freq: Every day | ORAL | 0 refills | Status: DC

## 2019-03-01 NOTE — Telephone Encounter (Signed)
I will refill one more month.

## 2019-03-01 NOTE — Telephone Encounter (Signed)
Pt aware via vm 

## 2019-03-12 DIAGNOSIS — D1801 Hemangioma of skin and subcutaneous tissue: Secondary | ICD-10-CM | POA: Diagnosis not present

## 2019-03-12 DIAGNOSIS — L570 Actinic keratosis: Secondary | ICD-10-CM | POA: Diagnosis not present

## 2019-03-12 DIAGNOSIS — Z85828 Personal history of other malignant neoplasm of skin: Secondary | ICD-10-CM | POA: Diagnosis not present

## 2019-03-12 DIAGNOSIS — L814 Other melanin hyperpigmentation: Secondary | ICD-10-CM | POA: Diagnosis not present

## 2019-03-12 DIAGNOSIS — L578 Other skin changes due to chronic exposure to nonionizing radiation: Secondary | ICD-10-CM | POA: Diagnosis not present

## 2019-03-19 ENCOUNTER — Telehealth: Payer: Self-pay | Admitting: Family Medicine

## 2019-03-19 NOTE — Telephone Encounter (Signed)
Patient calling requesting a refill on his amlodpipine called into [facility] [address].    CB# [phone]

## 2019-03-20 MED ORDER — AMLODIPINE BESYLATE 5 MG PO TABS: 5.0000 mg | ORAL_TABLET | Freq: Every day | ORAL | 3 refills | Status: DC

## 2019-03-20 NOTE — Telephone Encounter (Signed)
Medication called/sent to requested pharmacy  

## 2019-03-25 DIAGNOSIS — J189 Pneumonia, unspecified organism: Secondary | ICD-10-CM

## 2019-03-25 HISTORY — DX: Pneumonia, unspecified organism: J18.9

## 2019-04-05 ENCOUNTER — Ambulatory Visit (INDEPENDENT_AMBULATORY_CARE_PROVIDER_SITE_OTHER): Payer: Medicare HMO | Admitting: Family Medicine

## 2019-04-05 ENCOUNTER — Other Ambulatory Visit: Payer: Self-pay

## 2019-04-05 DIAGNOSIS — Z20828 Contact with and (suspected) exposure to other viral communicable diseases: Secondary | ICD-10-CM

## 2019-04-05 DIAGNOSIS — Z20822 Contact with and (suspected) exposure to covid-19: Secondary | ICD-10-CM

## 2019-04-05 NOTE — Progress Notes (Signed)
Subjective:    Patient ID: Brendan Hopkins, male    DOB: 1935/07/18, 83 y.o.   MRN: [id number]  HPI  Patient is a very pleasant 83 year old Caucasian male who is being seen today as a telephone visit.  Phone call began at 920.  Phone call concluded at 928.  Patient consents to be seen by telephone.  8 days ago, the patient got a haircut.  The person cutting his hair was not wearing a mask.  They were asymptomatic.  However the barber called him yesterday and stated that he had just turned positive for Covid.  His symptoms developed 2 or 3 days after he cut the patient's hair.  The patient denies any symptoms whatsoever.  He denies any cough, rhinorrhea, fatigue, change in taste or smell, runny nose, sore throat.  He states that he feels fine.  He went to an urgent care this morning and had a Covid test however the results will not be back for another 3 days.  His last exposure to COVID-19 was potentially 8 days ago. Past Medical History:  Diagnosis Date  . Arthritis    RIGHT HIP, HANDS, BACK  . Cataract   . Cervical radiculopathy    severe C3-4 neuroforaminal stenosis, C5-6 left synovial cyst compressing left nerve root.  . Colon polyps    2005  . COPD (chronic obstructive pulmonary disease) ([place])    SMOKED FOR 5 YRS  . Diverticulosis    2005  . Fatty liver   . GERD (gastroesophageal reflux disease)   . Hypertension   . Pain    RIGHT HIP AND BACK  . Personal history of colonic polyps-adenoma and sessile serrated polyp 07/15/2010  . RBBB    POSS HX AF IN PAST- PAC'S  . Shortness of breath    WITH EXERTION ONLY  . Stroke ([place])    FEW YEARS AGO - PT SAYS "HEAT STROKE" X 2 - BUT NO PHYSICAL DEFICITS  . TIA (transient ischemic attack)    Past Surgical History:  Procedure Laterality Date  . COLONOSCOPY    . TOTAL HIP ARTHROPLASTY Right 01/09/2013   Procedure: RIGHT TOTAL HIP ARTHROPLASTY ANTERIOR APPROACH;  Surgeon: [name], MD;  Location: [facility];  Service: Orthopedics;   Laterality: Right;   Current Outpatient Medications on File Prior to Visit  Medication Sig Dispense Refill  . amLODipine (NORVASC) 5 MG tablet Take 1 tablet (5 mg total) by mouth daily. 90 tablet 3  . aspirin EC 81 MG tablet Take 81 mg by mouth daily.    . fish oil-omega-3 fatty acids 1000 MG capsule Take 1 g by mouth daily.    . hydrochlorothiazide (HYDRODIURIL) 25 MG tablet Take 1 tablet (25 mg total) by mouth daily. 90 tablet 3  . meclizine (ANTIVERT) 25 MG tablet TAKE ONE TABLET BY MOUTH THREE TIMES DAILY AS NEEDED FOR DIZZINESS 30 tablet 2  . Multiple Vitamin (MULTIVITAMIN WITH MINERALS) TABS Take 1 tablet by mouth daily.    Marland Kitchen oxyCODONE-acetaminophen (PERCOCET) 10-325 MG tablet Take 1 tablet by mouth 2 (two) times daily at 10 AM and 5 PM. 60 tablet 0  . pantoprazole (PROTONIX) 40 MG tablet Take 1 tablet (40 mg total) by mouth daily. 90 tablet 3  . terbinafine (LAMISIL) 250 MG tablet Take 1 tablet (250 mg total) by mouth daily. 30 tablet 0   No current facility-administered medications on file prior to visit.    No Known Allergies Social History   Socioeconomic History  .  Marital status: Married    Spouse name: Not on file  . Number of children: Not on file  . Years of education: Not on file  . Highest education level: Not on file  Occupational History  . Occupation: Retired  Scientific laboratory technician  . Financial resource strain: Not on file  . Food insecurity    Worry: Not on file    Inability: Not on file  . Transportation needs    Medical: Not on file    Non-medical: Not on file  Tobacco Use  . Smoking status: Former Smoker    Years: 57.00    Types: Cigarettes    Quit date: 05/25/2007    Years since quitting: 11.8  . Smokeless tobacco: Never Used  Substance and Sexual Activity  . Alcohol use: No    Alcohol/week: 0.0 standard drinks  . Drug use: No  . Sexual activity: Not on file  Lifestyle  . Physical activity    Days per week: Not on file    Minutes per session: Not on  file  . Stress: Not on file  Relationships  . Social Herbalist on phone: Not on file    Gets together: Not on file    Attends religious service: Not on file    Active member of club or organization: Not on file    Attends meetings of clubs or organizations: Not on file    Relationship status: Not on file  . Intimate partner violence    Fear of current or ex partner: Not on file    Emotionally abused: Not on file    Physically abused: Not on file    Forced sexual activity: Not on file  Other Topics Concern  . 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      Objective:   Physical Exam  Patient was seen today as a telephone visit.  No physical exam could be performed.      Assessment & Plan:  Close exposure to COVID-19 virus  Patient has been asymptomatic now for 8 days.  I have recommended the patient quarantine for 14 days total.  I explained to the patient that symptoms can present up to 14 days after potential exposure so even though he feels fine now he should still quarantine at home to avoid exposing other people.  Hopefully his test results are returned negative early next week and he will complete the 14-day quarantine without any symptoms.  At the present time he needs no treatment as he is completely asymptomatic but I stressed the importance of wearing a mask and quarantining to avoid potentially exposing other people.

## 2019-04-10 ENCOUNTER — Other Ambulatory Visit: Payer: Self-pay | Admitting: Family Medicine

## 2019-04-10 ENCOUNTER — Telehealth: Payer: Self-pay | Admitting: Family Medicine

## 2019-04-10 MED ORDER — HYDROCODONE-HOMATROPINE 5-1.5 MG/5ML PO SYRP: 5.0000 mL | ORAL_SOLUTION | Freq: Three times a day (TID) | ORAL | 0 refills | Status: DC | PRN

## 2019-04-10 NOTE — Telephone Encounter (Signed)
Patient has covid 85, he wants to know if something can be called in for his cough if possible  [phone] [facility]

## 2019-04-10 NOTE — Telephone Encounter (Signed)
Pt aware.

## 2019-04-10 NOTE — Telephone Encounter (Signed)
I have sent in cough medicine to his pharmacy.  However if his cough is worsening and he develops shortness of breath he needs to go to the emergency room

## 2019-04-11 ENCOUNTER — Telehealth: Payer: Self-pay | Admitting: Family Medicine

## 2019-04-11 NOTE — Telephone Encounter (Signed)
If patient is expericing worsening cough, SOB, fever, he would need to be seen at ER to have chest xray. Since he is positive for COVID If he feels well otherwise and just has fever, he can continue to monitor symptoms, take fever reducer

## 2019-04-11 NOTE — Telephone Encounter (Signed)
Spoke with patient and informed him of recommendations. Patient verbalized understanding.

## 2019-04-11 NOTE — Telephone Encounter (Signed)
Patient called in stating that he tested positive for COVID on Sunday at an Urgent Care. Today makes day 7 of the onset of his symptoms. He has been doing fairly well just having some muscle aches and cough. However today he has ran a fever for the first time at 101.2.  Patient is wondering if you believe he could be experiencing a secondary infection. He would like to know if we could order a chest xray. Please advise?

## 2019-04-12 ENCOUNTER — Ambulatory Visit (INDEPENDENT_AMBULATORY_CARE_PROVIDER_SITE_OTHER): Payer: Medicare HMO

## 2019-04-12 ENCOUNTER — Ambulatory Visit (HOSPITAL_COMMUNITY)
Admission: EM | Admit: 2019-04-12 | Discharge: 2019-04-12 | Disposition: A | Discharge status: Home / Self Care | Payer: Medicare HMO | Attending: Family Medicine | Admitting: Family Medicine

## 2019-04-12 ENCOUNTER — Encounter (HOSPITAL_COMMUNITY): Payer: Self-pay

## 2019-04-12 ENCOUNTER — Other Ambulatory Visit: Payer: Self-pay

## 2019-04-12 DIAGNOSIS — U071 COVID-19: Secondary | ICD-10-CM

## 2019-04-12 DIAGNOSIS — J441 Chronic obstructive pulmonary disease with (acute) exacerbation: Secondary | ICD-10-CM | POA: Diagnosis not present

## 2019-04-12 DIAGNOSIS — R05 Cough: Secondary | ICD-10-CM | POA: Diagnosis not present

## 2019-04-12 DIAGNOSIS — R0602 Shortness of breath: Secondary | ICD-10-CM | POA: Diagnosis not present

## 2019-04-12 MED ORDER — DOXYCYCLINE HYCLATE 100 MG PO CAPS: 100.0000 mg | ORAL_CAPSULE | Freq: Two times a day (BID) | ORAL | 0 refills | Status: AC

## 2019-04-12 MED ORDER — ALBUTEROL SULFATE HFA 108 (90 BASE) MCG/ACT IN AERS
2.0000 | INHALATION_SPRAY | Freq: Once | RESPIRATORY_TRACT | Status: AC
  Administered 2019-04-12: 17:00:00 2 via RESPIRATORY_TRACT

## 2019-04-12 MED ORDER — ONDANSETRON 4 MG PO TBDP: 4.0000 mg | ORAL_TABLET | Freq: Three times a day (TID) | ORAL | 0 refills | Status: DC | PRN

## 2019-04-12 MED ORDER — PREDNISONE 10 MG PO TABS: 40.0000 mg | ORAL_TABLET | Freq: Every day | ORAL | 0 refills | Status: AC

## 2019-04-12 MED ORDER — BENZONATATE 100 MG PO CAPS: 100.0000 mg | ORAL_CAPSULE | Freq: Three times a day (TID) | ORAL | 0 refills | Status: DC

## 2019-04-12 MED ORDER — AEROCHAMBER PLUS FLO-VU LARGE MISC
Status: AC
  Filled 2019-04-12: qty 1

## 2019-04-12 MED ORDER — AEROCHAMBER PLUS FLO-VU MEDIUM MISC: 1.0000 | Freq: Once | Status: DC

## 2019-04-12 MED ORDER — ALBUTEROL SULFATE HFA 108 (90 BASE) MCG/ACT IN AERS
INHALATION_SPRAY | RESPIRATORY_TRACT | Status: AC
  Filled 2019-04-12: qty 6.7

## 2019-04-12 NOTE — Discharge Instructions (Addendum)
Doxycycline twice a day for the next 7 days Prednisone daily for the next 5 days.  Take this with food Tessalon Perles for cough every 8 hours as needed Zofran every 8 hours as needed for nausea, vomiting An inhaler given here.  You can use that every 6 hours as needed for cough, wheezing or shortness of breath If your symptoms worsen go to ER.

## 2019-04-12 NOTE — ED Provider Notes (Signed)
[facility]    CSN: [id number] Arrival date & time: 04/12/19  1405      History   Chief Complaint Chief Complaint  Patient presents with  . COVID POSITIVE sob, nausea    HPI LEAM SAYE is a 83 y.o. male.   Patient is a 83 year old male with past medical history of arthritis, cataract, COPD, diverticulosis, fatty liver, GERD, hypertension, right bundle branch block, TIA. Patient presents today with cough, mild shortness of breath, nausea, decreased appetite, fever.  Patient tested positive for Covid approximately 1 week ago.  Reporting the cough has somewhat improved but he is now concerned with the fever, nausea and loss of appetite.  He is not been able to eat much in over a week.  Reporting he is feeling overall weak.  He has been sipping fluids.  No vomiting or diarrhea.  No chest pain.  He has been taking hydrocodone cough syrup which he reports makes him nauseous.  ROS per HPI      Past Medical History:  Diagnosis Date  . Arthritis    RIGHT HIP, HANDS, BACK  . Cataract   . Cervical radiculopathy    severe C3-4 neuroforaminal stenosis, C5-6 left synovial cyst compressing left nerve root.  . Colon polyps    2005  . COPD (chronic obstructive pulmonary disease) ([place])    SMOKED FOR 69 YRS  . Diverticulosis    2005  . Fatty liver   . GERD (gastroesophageal reflux disease)   . Hypertension   . Pain    RIGHT HIP AND BACK  . Personal history of colonic polyps-adenoma and sessile serrated polyp 07/15/2010  . RBBB    POSS HX AF IN PAST- PAC'S  . Shortness of breath    WITH EXERTION ONLY  . Stroke ([place])    FEW YEARS AGO - PT SAYS "HEAT STROKE" X 2 - BUT NO PHYSICAL DEFICITS  . TIA (transient ischemic attack)     Patient Active Problem List   Diagnosis Date Noted  . Obese 01/10/2013  . S/P right THA, AA 01/09/2013  . Dyspnea 10/20/2012  . Preop cardiovascular exam 10/20/2012  . Bruit 10/20/2012  . HLD (hyperlipidemia) 12/02/2011  . Impaired  glucose tolerance 12/02/2011  . TIA (transient ischemic attack) 12/01/2011  . H/O atrial fibrillation without current medication 12/01/2011  . HTN (hypertension) 12/01/2011  . Arthritis 12/01/2011  . Personal history of colonic polyps-adenoma and sessile serrated polyp 07/15/2010    Past Surgical History:  Procedure Laterality Date  . COLONOSCOPY    . TOTAL HIP ARTHROPLASTY Right 01/09/2013   Procedure: RIGHT TOTAL HIP ARTHROPLASTY ANTERIOR APPROACH;  Surgeon: [name], MD;  Location: [facility];  Service: Orthopedics;  Laterality: Right;       Home Medications    Prior to Admission medications   Medication Sig Start Date End Date Taking? Authorizing Provider  amLODipine (NORVASC) 5 MG tablet Take 1 tablet (5 mg total) by mouth daily. 03/20/19   [name], MD  aspirin EC 81 MG tablet Take 81 mg by mouth daily.    [provider]  benzonatate (TESSALON) 100 MG capsule Take 1 capsule (100 mg total) by mouth every 8 (eight) hours. 04/12/19   Loura Halt A, NP  doxycycline (VIBRAMYCIN) 100 MG capsule Take 1 capsule (100 mg total) by mouth 2 (two) times daily for 7 days. 04/12/19 04/19/19  Loura Halt A, NP  fish oil-omega-3 fatty acids 1000 MG capsule Take 1 g by mouth daily.  [provider]  hydrochlorothiazide (HYDRODIURIL) 25 MG tablet Take 1 tablet (25 mg total) by mouth daily. 10/30/18   [name], MD  HYDROcodone-homatropine (HYCODAN) 5-1.5 MG/5ML syrup Take 5 mLs by mouth every 8 (eight) hours as needed for cough. 04/10/19   [name], MD  meclizine (ANTIVERT) 25 MG tablet TAKE ONE TABLET BY MOUTH THREE TIMES DAILY AS NEEDED FOR DIZZINESS 01/13/18   [name], MD  Multiple Vitamin (MULTIVITAMIN WITH MINERALS) TABS Take 1 tablet by mouth daily.    [provider]  ondansetron (ZOFRAN ODT) 4 MG disintegrating tablet Take 1 tablet (4 mg total) by mouth every 8 (eight) hours as needed for nausea or vomiting. 04/12/19   Loura Halt A, NP  oxyCODONE-acetaminophen (PERCOCET) 10-325 MG tablet Take 1 tablet by mouth 2 (two) times daily at 10 AM and 5 PM. 02/15/19   [name], MD  pantoprazole (PROTONIX) 40 MG tablet Take 1 tablet (40 mg total) by mouth daily. 11/07/18   [name], MD  predniSONE (DELTASONE) 10 MG tablet Take 4 tablets (40 mg total) by mouth daily for 5 days. 04/12/19 04/17/19  Loura Halt A, NP  terbinafine (LAMISIL) 250 MG tablet Take 1 tablet (250 mg total) by mouth daily. 03/01/19   [name], MD    Family History Family History  Problem Relation Age of Onset  . Cancer Father   . Cancer Mother        died more of old age at age 50  . Colon cancer Neg Hx   . Esophageal cancer Neg Hx   . Prostate cancer Neg Hx   . Rectal cancer Neg Hx   . Stomach cancer Neg Hx     Social History Social History   Tobacco Use  . Smoking status: Former Smoker    Years: 57.00    Types: Cigarettes    Quit date: 05/25/2007    Years since quitting: 11.8  . Smokeless tobacco: Never Used  Substance Use Topics  . Alcohol use: No    Alcohol/week: 0.0 standard drinks  . Drug use: No     Allergies   Patient has no known allergies.   Review of Systems Review of Systems   Physical Exam Triage Vital Signs ED Triage Vitals  Enc Vitals Group     BP 04/12/19 1504 (!) 165/89     Pulse Rate 04/12/19 1504 87     Resp 04/12/19 1504 16     Temp 04/12/19 1508 97.8 F (36.6 C)     Temp Source 04/12/19 1508 Oral     SpO2 04/12/19 1504 94 %     Weight --      Height --      Head Circumference --      Peak Flow --      Pain Score 04/12/19 1509 0     Pain Loc --      Pain Edu? --      Excl. in [place]? --    No data found.  Updated Vital Signs BP (!) 165/89 (BP Location: Left Arm)   Pulse 87   Temp 97.8 F (36.6 C) (Oral)   Resp (!) 22   SpO2 94%   Visual Acuity Right Eye Distance:   Left Eye Distance:   Bilateral Distance:    Right Eye Near:   Left Eye Near:    Bilateral  Near:     Physical Exam Vitals signs and nursing note reviewed.  Constitutional:      General: He is not in acute distress.    Appearance: Normal appearance. He is not ill-appearing, toxic-appearing or diaphoretic.  HENT:     Head: Normocephalic and atraumatic.     Nose: Nose normal.     Mouth/Throat:     Pharynx: Oropharynx is clear.  Eyes:     Conjunctiva/sclera: Conjunctivae normal.  Neck:     Musculoskeletal: Normal range of motion.  Cardiovascular:     Rate and Rhythm: Normal rate and regular rhythm.  Pulmonary:     Effort: Pulmonary effort is normal.     Comments: Mild expiratory wheezing.  Patient coughing during entire exam Musculoskeletal: Normal range of motion.  Skin:    General: Skin is warm and dry.  Neurological:     Mental Status: He is alert.  Psychiatric:        Mood and Affect: Mood normal.      UC Treatments / Results  Labs (all labs ordered are listed, but only abnormal results are displayed) Labs Reviewed - No data to display  EKG   Radiology Dg Chest 2 View  Result Date: 04/12/2019 CLINICAL DATA:  Cough and shortness of breath. COVID positive. EXAM: CHEST - 2 VIEW COMPARISON:  Chest x-rays dated 01/26/2019 and 05/13/2016 and chest CT dated 12/06/2014 FINDINGS: Since the prior study the patient has developed increased diffuse accentuation of the interstitial markings bilaterally most prominent at the bases and in the right midzone. Chronic peribronchial thickening has increased. There is chronic slight elevation of the left hemidiaphragm with slight scarring at the left lung base. The heart size and pulmonary vascularity are normal. No effusions. No acute bone abnormality. IMPRESSION: 1. Increased accentuation of the interstitial markings since the prior study. This could represent acute on chronic interstitial lung disease. The prior chest CT demonstrated emphysematous changes in the upper lobes. 2. Progressive chronic peribronchial thickening.  Electronically Signed   By: Lorriane Shire M.D.   On: 04/12/2019 16:10    Procedures Procedures (including critical care time)  Medications Ordered in UC Medications  albuterol (VENTOLIN HFA) 108 (90 Base) MCG/ACT inhaler 2 puff (2 puffs Inhalation Given 04/12/19 1646)  albuterol (VENTOLIN HFA) 108 (90 Base) MCG/ACT inhaler (has no administration in time range)  AeroChamber Plus Flo-Vu Large MISC (has no administration in time range)    Initial Impression / Assessment and Plan / UC Course  I have reviewed the triage vital signs and the nursing notes.  Pertinent labs & imaging results that were available during my care of the patient were reviewed by me and considered in my medical decision making (see chart for details).     COVID-19-patient with mild shortness of breath and nausea, loss of appetite. X-ray without lobar pneumonia or viral pneumonia Most likely COPD exacerbation due to the progressive chronic peribronchial thickening.  Patient's vital signs stable and he is not dyspneic or in any distress. Sats 94%. We will treat with prednisone daily over the next 5 days Tessalon Perles for cough instead of the hydrocodone cough syrup.  Patient reports medication makes him nauseous. Albuterol inhaler And antibiotic coverage with doxycycline. Zofran as needed for nausea, vomiting  Final Clinical Impressions(s) / UC Diagnoses   Final diagnoses:  COPD exacerbation ([place])  COVID-19     Discharge Instructions     Doxycycline twice a day for the next 7 days Prednisone daily for the next 5 days.  Take this with food Tessalon Perles for cough every 8 hours as  needed Zofran every 8 hours as needed for nausea, vomiting An inhaler given here.  You can use that every 6 hours as needed for cough, wheezing or shortness of breath If your symptoms worsen go to ER.     ED Prescriptions    Medication Sig Dispense Auth. Provider   doxycycline (VIBRAMYCIN) 100 MG capsule Take 1 capsule  (100 mg total) by mouth 2 (two) times daily for 7 days. 14 capsule Azariya Freeman A, NP   predniSONE (DELTASONE) 10 MG tablet Take 4 tablets (40 mg total) by mouth daily for 5 days. 20 tablet Melvie Paglia A, NP   benzonatate (TESSALON) 100 MG capsule Take 1 capsule (100 mg total) by mouth every 8 (eight) hours. 21 capsule Avonelle Viveros A, NP   ondansetron (ZOFRAN ODT) 4 MG disintegrating tablet Take 1 tablet (4 mg total) by mouth every 8 (eight) hours as needed for nausea or vomiting. 20 tablet Loura Halt A, NP     PDMP not reviewed this encounter.   Orvan July, NP 04/13/19 1455

## 2019-04-12 NOTE — ED Triage Notes (Signed)
Pt presents to UC stating he tested positive covid 1 week ago. Pt has sob and nausea x1 week. Pt's nurse stated to come here to get xray.

## 2019-04-13 ENCOUNTER — Encounter (HOSPITAL_COMMUNITY): Payer: Self-pay | Admitting: Family Medicine

## 2019-04-18 ENCOUNTER — Other Ambulatory Visit: Payer: Self-pay

## 2019-04-18 MED ORDER — OXYCODONE-ACETAMINOPHEN 10-325 MG PO TABS: 1.0000 | ORAL_TABLET | Freq: Two times a day (BID) | ORAL | 0 refills | Status: DC

## 2019-04-18 NOTE — Telephone Encounter (Signed)
Requested Prescriptions   Pending Prescriptions Disp Refills  . oxyCODONE-acetaminophen (PERCOCET) 10-325 MG tablet 60 tablet 0    Sig: Take 1 tablet by mouth 2 (two) times daily at 10 AM and 5 PM.    Last OV 04/05/2019   Last written 02/15/2019

## 2019-06-18 ENCOUNTER — Other Ambulatory Visit: Payer: Self-pay | Admitting: Family Medicine

## 2019-06-18 NOTE — Telephone Encounter (Signed)
Patient requesting a refill on Oxycodone     LOV: 04/05/2019  LRF:    05/18/2019

## 2019-06-18 NOTE — Telephone Encounter (Signed)
Patient requesting a refill on his oxycodone he uses Paediatric nurse at [facility].  CB# [phone]

## 2019-06-19 MED ORDER — OXYCODONE-ACETAMINOPHEN 10-325 MG PO TABS: 1.0000 | ORAL_TABLET | Freq: Two times a day (BID) | ORAL | 0 refills | Status: DC

## 2019-06-27 ENCOUNTER — Telehealth: Payer: Self-pay | Admitting: Family Medicine

## 2019-06-27 NOTE — Telephone Encounter (Signed)
Pt called and states that he would like to try Ambien to help him sleep at night. He has taken some of his daughters and it works very well for him. Please advise.

## 2019-06-28 ENCOUNTER — Other Ambulatory Visit: Payer: Self-pay | Admitting: Family Medicine

## 2019-06-28 MED ORDER — ZOLPIDEM TARTRATE 10 MG PO TABS: 10.0000 mg | ORAL_TABLET | Freq: Every evening | ORAL | 1 refills | Status: DC | PRN

## 2019-06-28 NOTE — Telephone Encounter (Signed)
I will give him ambien but don't take with his pain medication

## 2019-06-28 NOTE — Telephone Encounter (Signed)
Pt aware of med and not to take both together

## 2019-07-01 ENCOUNTER — Encounter (HOSPITAL_COMMUNITY): Payer: Self-pay | Admitting: Emergency Medicine

## 2019-07-01 ENCOUNTER — Emergency Department (HOSPITAL_COMMUNITY): Payer: Medicare Other

## 2019-07-01 ENCOUNTER — Emergency Department (HOSPITAL_COMMUNITY)
Admission: EM | Admit: 2019-07-01 | Discharge: 2019-07-01 | Disposition: A | Discharge status: Home / Self Care | Payer: Medicare Other | Attending: Emergency Medicine | Admitting: Emergency Medicine

## 2019-07-01 DIAGNOSIS — S82841A Displaced bimalleolar fracture of right lower leg, initial encounter for closed fracture: Secondary | ICD-10-CM | POA: Diagnosis not present

## 2019-07-01 DIAGNOSIS — Y9389 Activity, other specified: Secondary | ICD-10-CM | POA: Diagnosis not present

## 2019-07-01 DIAGNOSIS — J449 Chronic obstructive pulmonary disease, unspecified: Secondary | ICD-10-CM | POA: Diagnosis not present

## 2019-07-01 DIAGNOSIS — Y999 Unspecified external cause status: Secondary | ICD-10-CM | POA: Diagnosis not present

## 2019-07-01 DIAGNOSIS — S8251XD Displaced fracture of medial malleolus of right tibia, subsequent encounter for closed fracture with routine healing: Secondary | ICD-10-CM | POA: Diagnosis not present

## 2019-07-01 DIAGNOSIS — S82831A Other fracture of upper and lower end of right fibula, initial encounter for closed fracture: Secondary | ICD-10-CM | POA: Diagnosis not present

## 2019-07-01 DIAGNOSIS — X500XXA Overexertion from strenuous movement or load, initial encounter: Secondary | ICD-10-CM | POA: Diagnosis not present

## 2019-07-01 DIAGNOSIS — Z87891 Personal history of nicotine dependence: Secondary | ICD-10-CM | POA: Diagnosis not present

## 2019-07-01 DIAGNOSIS — Z743 Need for continuous supervision: Secondary | ICD-10-CM | POA: Diagnosis not present

## 2019-07-01 DIAGNOSIS — Z79899 Other long term (current) drug therapy: Secondary | ICD-10-CM | POA: Insufficient documentation

## 2019-07-01 DIAGNOSIS — R609 Edema, unspecified: Secondary | ICD-10-CM | POA: Diagnosis not present

## 2019-07-01 DIAGNOSIS — Z8616 Personal history of COVID-19: Secondary | ICD-10-CM | POA: Insufficient documentation

## 2019-07-01 DIAGNOSIS — Z7982 Long term (current) use of aspirin: Secondary | ICD-10-CM | POA: Insufficient documentation

## 2019-07-01 DIAGNOSIS — S82891A Other fracture of right lower leg, initial encounter for closed fracture: Secondary | ICD-10-CM | POA: Diagnosis not present

## 2019-07-01 DIAGNOSIS — I10 Essential (primary) hypertension: Secondary | ICD-10-CM | POA: Diagnosis not present

## 2019-07-01 DIAGNOSIS — W010XXA Fall on same level from slipping, tripping and stumbling without subsequent striking against object, initial encounter: Secondary | ICD-10-CM | POA: Insufficient documentation

## 2019-07-01 DIAGNOSIS — S99911A Unspecified injury of right ankle, initial encounter: Secondary | ICD-10-CM | POA: Diagnosis present

## 2019-07-01 DIAGNOSIS — Y92007 Garden or yard of unspecified non-institutional (private) residence as the place of occurrence of the external cause: Secondary | ICD-10-CM | POA: Diagnosis not present

## 2019-07-01 DIAGNOSIS — S8251XA Displaced fracture of medial malleolus of right tibia, initial encounter for closed fracture: Secondary | ICD-10-CM | POA: Diagnosis not present

## 2019-07-01 DIAGNOSIS — R0902 Hypoxemia: Secondary | ICD-10-CM | POA: Diagnosis not present

## 2019-07-01 DIAGNOSIS — S82831D Other fracture of upper and lower end of right fibula, subsequent encounter for closed fracture with routine healing: Secondary | ICD-10-CM | POA: Diagnosis not present

## 2019-07-01 DIAGNOSIS — W19XXXA Unspecified fall, initial encounter: Secondary | ICD-10-CM | POA: Diagnosis not present

## 2019-07-01 LAB — CBC WITH DIFFERENTIAL/PLATELET
Abs Immature Granulocytes: 0.04 10*3/uL (ref 0.00–0.07)
Basophils Absolute: 0.1 10*3/uL (ref 0.0–0.1)
Basophils Relative: 1 %
Eosinophils Absolute: 0.1 10*3/uL (ref 0.0–0.5)
Eosinophils Relative: 2 %
HCT: 48.6 % (ref 39.0–52.0)
Hemoglobin: 16.6 g/dL (ref 13.0–17.0)
Immature Granulocytes: 1 %
Lymphocytes Relative: 16 %
Lymphs Abs: 1.4 10*3/uL (ref 0.7–4.0)
MCH: 32.9 pg (ref 26.0–34.0)
MCHC: 34.2 g/dL (ref 30.0–36.0)
MCV: 96.2 fL (ref 80.0–100.0)
Monocytes Absolute: 0.5 10*3/uL (ref 0.1–1.0)
Monocytes Relative: 6 %
Neutro Abs: 6.6 10*3/uL (ref 1.7–7.7)
Neutrophils Relative %: 74 %
Platelets: 198 10*3/uL (ref 150–400)
RBC: 5.05 MIL/uL (ref 4.22–5.81)
RDW: 12.9 % (ref 11.5–15.5)
WBC: 8.8 10*3/uL (ref 4.0–10.5)
nRBC: 0 % (ref 0.0–0.2)

## 2019-07-01 LAB — BASIC METABOLIC PANEL
Anion gap: 10 (ref 5–15)
BUN: 21 mg/dL (ref 8–23)
CO2: 23 mmol/L (ref 22–32)
Calcium: 9.3 mg/dL (ref 8.9–10.3)
Chloride: 102 mmol/L (ref 98–111)
Creatinine, Ser: 0.99 mg/dL (ref 0.61–1.24)
GFR calc Af Amer: >60 mL/min (ref >60)
GFR calc non Af Amer: >60 mL/min (ref >60)
Glucose, Bld: 118 mg/dL — ABNORMAL HIGH (ref 70–99)
Potassium: 4 mmol/L (ref 3.5–5.1)
Sodium: 135 mmol/L (ref 135–145)

## 2019-07-01 LAB — PROTIME-INR
INR: 1 (ref 0.8–1.2)
Prothrombin Time: 12.9 seconds (ref 11.4–15.2)

## 2019-07-01 MED ORDER — IBUPROFEN 400 MG PO TABS
600.0000 mg | ORAL_TABLET | Freq: Once | ORAL | Status: AC
  Administered 2019-07-01: 12:00:00 600 mg via ORAL
  Filled 2019-07-01: qty 1

## 2019-07-01 MED ORDER — FENTANYL CITRATE (PF) 100 MCG/2ML IJ SOLN
INTRAMUSCULAR | Status: AC | PRN
  Administered 2019-07-01: 50 ug via INTRAVENOUS

## 2019-07-01 MED ORDER — IBUPROFEN 600 MG PO TABS: 600.0000 mg | ORAL_TABLET | Freq: Four times a day (QID) | ORAL | 0 refills | Status: DC | PRN

## 2019-07-01 MED ORDER — ETOMIDATE 2 MG/ML IV SOLN
INTRAVENOUS | Status: AC | PRN
  Administered 2019-07-01: 10 mg via INTRAVENOUS

## 2019-07-01 MED ORDER — OXYCODONE-ACETAMINOPHEN 5-325 MG PO TABS: 1.0000 | ORAL_TABLET | Freq: Four times a day (QID) | ORAL | 0 refills | Status: DC | PRN

## 2019-07-01 MED ORDER — FENTANYL CITRATE (PF) 100 MCG/2ML IJ SOLN
100.0000 ug | Freq: Once | INTRAMUSCULAR | Status: DC
  Filled 2019-07-01: qty 2

## 2019-07-01 MED ORDER — ETOMIDATE 2 MG/ML IV SOLN
10.0000 mg | Freq: Once | INTRAVENOUS | Status: DC
  Filled 2019-07-01: qty 10

## 2019-07-01 NOTE — ED Triage Notes (Signed)
Pt here from home with c/o right ankle pain after a fall this morning when he went out to feed his birds ,pt had to crawl back to the house

## 2019-07-01 NOTE — Discharge Instructions (Addendum)
You have a serious fracture of your right ankle.  Your bones were dislocated, but we were able to line them back up in the ER today.  You will still need to see an orthopedic surgeon this week, and you will very likely need an operation to stabilize your ankle.  Please call Dr Tama Headings office on Monday.  He would like to see you in their office on Tuesday.  It is very important you don't miss this appointment.  In the meantime, you need to be NON WEIGHT BEARING on your right foot.  Do not put any weight on your foot.  Use your crutches to get around.  Keep your cast warm and dry at home.  If you notice your toes turning blue or purple, or losing feeling in your foot, come back to the ER immediately.  These can be signs of injuries to your blood vessels or nerves in your ankle or foot.  Finally, you can take motrin 600 mg every 6 hours for pain, for the next 7-10 days.  If you have severe pain, you can take a percocet (5 mg).  Be aware this medicine has opioids in it, and can make you woozy, drowsy, dizzy.  It can raise your risk of falling.  Do not take this medicine before driving (you should also not drive until your ankle is healed).   Your prescriptions were sent to your [facility] on [address].

## 2019-07-01 NOTE — ED Provider Notes (Signed)
[facility] EMERGENCY DEPARTMENT Provider Note   CSN: [id number] Arrival date & time: 07/01/19  1017     History CC: ankle injury  Brendan Hopkins is a 84 y.o. male presenting to the emergency department with mechanical fall and right ankle injury.  Patient reports that he was restocking his bird feeder today outside and then slipped coming back into the house.  He still he rolled his ankle backwards and fell forwards.  He had immediate severe pain in his right ankle was unable to walk.  He dragged himself into his house.  He denies striking his head or injuring any other part of his body.  He is not on any blood thinners.  He denies numbness in his foot.  Hx of COVID-19 illness in Nov 2020  HPI     Past Medical History:  Diagnosis Date  . Arthritis    RIGHT HIP, HANDS, BACK  . Cataract   . Cervical radiculopathy    severe C3-4 neuroforaminal stenosis, C5-6 left synovial cyst compressing left nerve root.  . Colon polyps    2005  . COPD (chronic obstructive pulmonary disease) ([place])    SMOKED FOR 42 YRS  . Diverticulosis    2005  . Fatty liver   . GERD (gastroesophageal reflux disease)   . Hypertension   . Pain    RIGHT HIP AND BACK  . Personal history of colonic polyps-adenoma and sessile serrated polyp 07/15/2010  . RBBB    POSS HX AF IN PAST- PAC'S  . Shortness of breath    WITH EXERTION ONLY  . Stroke ([place])    FEW YEARS AGO - PT SAYS "HEAT STROKE" X 2 - BUT NO PHYSICAL DEFICITS  . TIA (transient ischemic attack)     Patient Active Problem List   Diagnosis Date Noted  . Obese 01/10/2013  . S/P right THA, AA 01/09/2013  . Dyspnea 10/20/2012  . Preop cardiovascular exam 10/20/2012  . Bruit 10/20/2012  . HLD (hyperlipidemia) 12/02/2011  . Impaired glucose tolerance 12/02/2011  . TIA (transient ischemic attack) 12/01/2011  . H/O atrial fibrillation without current medication 12/01/2011  . HTN (hypertension) 12/01/2011  . Arthritis 12/01/2011    . Personal history of colonic polyps-adenoma and sessile serrated polyp 07/15/2010    Past Surgical History:  Procedure Laterality Date  . COLONOSCOPY    . TOTAL HIP ARTHROPLASTY Right 01/09/2013   Procedure: RIGHT TOTAL HIP ARTHROPLASTY ANTERIOR APPROACH;  Surgeon: [name], MD;  Location: [facility] ORS;  Service: Orthopedics;  Laterality: Right;       Family History  Problem Relation Age of Onset  . Cancer Father   . Cancer Mother        died more of old age at age 47  . Colon cancer Neg Hx   . Esophageal cancer Neg Hx   . Prostate cancer Neg Hx   . Rectal cancer Neg Hx   . Stomach cancer Neg Hx     Social History   Tobacco Use  . Smoking status: Former Smoker    Years: 57.00    Types: Cigarettes    Quit date: 05/25/2007    Years since quitting: 12.1  . Smokeless tobacco: Never Used  Substance Use Topics  . Alcohol use: No    Alcohol/week: 0.0 standard drinks  . Drug use: No    Home Medications Prior to Admission medications   Medication Sig Start Date End Date Taking? Authorizing Provider  amLODipine (NORVASC) 5 MG tablet  Take 1 tablet (5 mg total) by mouth daily. 03/20/19   [name], MD  aspirin EC 81 MG tablet Take 81 mg by mouth daily.    [provider]  benzonatate (TESSALON) 100 MG capsule Take 1 capsule (100 mg total) by mouth every 8 (eight) hours. 04/12/19   Bast, Tressia Miners A, NP  fish oil-omega-3 fatty acids 1000 MG capsule Take 1 g by mouth daily.    [provider]  hydrochlorothiazide (HYDRODIURIL) 25 MG tablet Take 1 tablet (25 mg total) by mouth daily. 10/30/18   [name], MD  HYDROcodone-homatropine (HYCODAN) 5-1.5 MG/5ML syrup Take 5 mLs by mouth every 8 (eight) hours as needed for cough. 04/10/19   [name], MD  ibuprofen (ADVIL) 600 MG tablet Take 1 tablet (600 mg total) by mouth every 6 (six) hours as needed for up to 30 doses for mild pain or moderate pain. 07/01/19   Wyvonnia Dusky, MD  meclizine  (ANTIVERT) 25 MG tablet TAKE ONE TABLET BY MOUTH THREE TIMES DAILY AS NEEDED FOR DIZZINESS 01/13/18   [name], MD  Multiple Vitamin (MULTIVITAMIN WITH MINERALS) TABS Take 1 tablet by mouth daily.    [provider]  ondansetron (ZOFRAN ODT) 4 MG disintegrating tablet Take 1 tablet (4 mg total) by mouth every 8 (eight) hours as needed for nausea or vomiting. 04/12/19   Loura Halt A, NP  oxyCODONE-acetaminophen (PERCOCET) 10-325 MG tablet Take 1 tablet by mouth 2 (two) times daily at 10 AM and 5 PM. 06/19/19   [name], MD  oxyCODONE-acetaminophen (PERCOCET/ROXICET) 5-325 MG tablet Take 1 tablet by mouth every 6 (six) hours as needed for up to 5 doses for severe pain. 07/01/19   Wyvonnia Dusky, MD  pantoprazole (PROTONIX) 40 MG tablet Take 1 tablet (40 mg total) by mouth daily. 11/07/18   [name], MD  terbinafine (LAMISIL) 250 MG tablet Take 1 tablet (250 mg total) by mouth daily. 03/01/19   [name], MD  zolpidem (AMBIEN) 10 MG tablet Take 1 tablet (10 mg total) by mouth at bedtime as needed for sleep. 06/28/19 07/28/19  [name], MD    Allergies    Patient has no known allergies.  Review of Systems   Review of Systems  Constitutional: Negative for chills and fever.  Respiratory: Negative for cough and shortness of breath.   Cardiovascular: Negative for chest pain and palpitations.  Musculoskeletal: Positive for arthralgias, gait problem, joint swelling and myalgias.  Neurological: Negative for syncope, numbness and headaches.  Psychiatric/Behavioral: Negative for agitation and confusion.  All other systems reviewed and are negative.   Physical Exam Updated Vital Signs BP (!) 142/87   Pulse (!) 57   Temp 98.6 F (37 C) (Oral)   Resp 16   SpO2 96%   Physical Exam Vitals and nursing note reviewed.  Constitutional:      Appearance: He is well-developed.  HENT:     Head: Normocephalic and atraumatic.  Eyes:     Conjunctiva/sclera:  Conjunctivae normal.  Cardiovascular:     Rate and Rhythm: Normal rate and regular rhythm.     Pulses: Normal pulses.  Pulmonary:     Effort: Pulmonary effort is normal. No respiratory distress.  Musculoskeletal:     Cervical back: Neck supple.     Comments: Gross deformity and swelling of right ankle No open fracture or wound  Skin:    General: Skin is warm and dry.  Neurological:  Mental Status: He is alert.     Comments: Sensation in foot intact Able to move all toes  Psychiatric:        Mood and Affect: Mood normal.        Behavior: Behavior normal.     ED Results / Procedures / Treatments   Labs (all labs ordered are listed, but only abnormal results are displayed) Labs Reviewed  BASIC METABOLIC PANEL - Abnormal; Notable for the following components:      Result Value   Glucose, Bld 118 (*)    All other components within normal limits  CBC WITH DIFFERENTIAL/PLATELET  PROTIME-INR    EKG None  Radiology DG Ankle 2 Views Right  Result Date: 07/01/2019 CLINICAL DATA:  Fracture.  Post reduction. EXAM: RIGHT ANKLE - 2 VIEW COMPARISON:  Earlier same day FINDINGS: Two views study has fine bony detail obscured by overlying fiberglass cast. The tibiotalar dislocation has been reduced in the interval with comminuted fractures of the medial malleolus and distal fibula again noted. No definite posterior lip fracture noted distal tibia. IMPRESSION: Improved alignment status post closed reduction. Electronically Signed   By: Misty Stanley M.D.   On: 07/01/2019 14:18   DG Ankle Complete Right  Result Date: 07/01/2019 CLINICAL DATA:  84 year old male with ankle injury EXAM: RIGHT ANKLE - COMPLETE 3+ VIEW COMPARISON:  None. FINDINGS: Osteopenia. Posterolateral subluxation of the tibiotalar joint. Comminuted fracture of the distal fibula. Comminuted fracture of the medial malleolus. Soft tissue swelling. Joint effusion. Degenerative changes of the hindfoot. IMPRESSION: Tibiotalar  fracture dislocation Electronically Signed   By: Corrie Mckusick D.O.   On: 07/01/2019 13:05    Procedures .Ortho Injury Treatment  Date/Time: 07/01/2019 5:26 PM Performed by: Wyvonnia Dusky, MD Authorized by: Wyvonnia Dusky, MD   Consent:    Consent obtained:  Verbal   Consent given by:  Patient   Risks discussed:  Fracture, nerve damage and recurrent dislocationInjury location: ankle Location details: right ankle Injury type: fracture-dislocation Fracture type: trimalleolar Pre-procedure neurovascular assessment: neurovascularly intact Pre-procedure distal perfusion: normal Pre-procedure neurological function: normal Pre-procedure range of motion: reduced  Anesthesia: Local anesthesia used: no  Patient sedated: Yes. Refer to sedation procedure documentation for details of sedation. Manipulation performed: yes Skin traction used: yes Skeletal traction used: yes Reduction successful: yes X-ray confirmed reduction: yes Immobilization: splint Splint type: ankle stirrup Supplies used: cotton padding and Ortho-Glass Post-procedure neurovascular assessment: post-procedure neurovascularly intact Post-procedure distal perfusion: normal Post-procedure neurological function: normal Post-procedure range of motion: improved Patient tolerance: patient tolerated the procedure well with no immediate complications  .Sedation  Date/Time: 07/01/2019 5:27 PM Performed by: Wyvonnia Dusky, MD Authorized by: Wyvonnia Dusky, MD   Consent:    Consent obtained:  Verbal   Consent given by:  Patient   Risks discussed:  Allergic reaction, prolonged hypoxia resulting in organ damage, prolonged sedation necessitating reversal, dysrhythmia, inadequate sedation, respiratory compromise necessitating ventilatory assistance and intubation, vomiting and nausea Universal protocol:    Procedure explained and questions answered to patient or proxy's satisfaction: yes     Relevant documents  present and verified: yes     Test results available and properly labeled: yes     Imaging studies available: yes     Required blood products, implants, devices, and special equipment available: yes     Site/side marked: yes     Immediately prior to procedure a time out was called: yes     Patient identity confirmation method:  Anonymous  protocol, patient vented/unresponsive, verbally with patient and arm band Indications:    Procedure performed:  Dislocation reduction   Procedure necessitating sedation performed by:  Physician performing sedation Pre-sedation assessment:    Time since last food or drink:  6 hour   ASA classification: class 3 - patient with severe systemic disease     Neck mobility: normal     Mouth opening:  2 finger widths   Thyromental distance:  3 finger widths   Mallampati score:  III - soft palate, base of uvula visible   Pre-sedation assessments completed and reviewed: airway patency, cardiovascular function, mental status, nausea/vomiting, pain level and respiratory function   Immediate pre-procedure details:    Reassessment: Patient reassessed immediately prior to procedure     Reviewed: vital signs, relevant labs/tests and NPO status     Verified: bag valve mask available, emergency equipment available, intubation equipment available, IV patency confirmed, oxygen available, reversal medications available and suction available   Procedure details (see MAR for exact dosages):    Preoxygenation:  Nasal cannula   Sedation:  Etomidate   Intended level of sedation: deep   Analgesia:  Fentanyl   Intra-procedure monitoring:  Continuous capnometry, blood pressure monitoring, cardiac monitor, continuous pulse oximetry, frequent LOC assessments and frequent vital sign checks   Intra-procedure events: none     Total Provider sedation time (minutes):  30 Post-procedure details:    Attendance: Constant attendance by certified staff until patient recovered     Recovery:  Patient returned to pre-procedure baseline     Post-sedation assessments completed and reviewed: airway patency, cardiovascular function, mental status, pain level and respiratory function     Patient is stable for discharge or admission: yes     Patient tolerance:  Tolerated well, no immediate complications   (including critical care time)  Medications Ordered in ED Medications  fentaNYL (SUBLIMAZE) injection 100 mcg (has no administration in time range)  etomidate (AMIDATE) injection 10 mg (has no administration in time range)  ibuprofen (ADVIL) tablet 600 mg (600 mg Oral Given by Other 07/01/19 1137)  fentaNYL (SUBLIMAZE) injection (50 mcg Intravenous Given 07/01/19 1329)  etomidate (AMIDATE) injection (10 mg Intravenous Given 07/01/19 1329)    ED Course  I have reviewed the triage vital signs and the nursing notes.  Pertinent labs & imaging results that were available during my care of the patient were reviewed by me and considered in my medical decision making (see chart for details).  84 yo male presenting to ED with right ankle fracture/dislocation after mechanical fall.  Neurovascularly intact.  Ankle reduced in ED after discussion with Dr Doreatha Martin of orthopedics.  Patient placed in splint, neurological and vascularly reassessed after reduction, and discharged home on crutches with planned close outpatient office follow up.  Clinical Course as of Jul 01 1727  Nancy Fetter Jul 01, 2019  1150 Per my interpretion of xray, patient with bilmalleolar, possible trimalleolar fracture and dislocation of the ankle.  He remains neurovascularly intact in his foot   [MT]  1225 Will attempt reduction in ED, per dr haddix.     [MT]  1318 IMPRESSION: Tibiotalar fracture dislocation   [MT]  1339 Attempted reduction w/ 50 mcg fentanyl for pain, 10 mg etomidate.  Awaiting xray, patient tolerated well   [MT]  1407 I spoke to Dr Doreatha Martin ortho who reports reduction appears appropriate on films.  If patient can  manage crutches and NWB at home, okay to discharge and have him f/u in their office Tuesday,  likely OR fixation on Wed.  We'll see how the patient does once anesthesia has fully worn off    [MT]  1443 Able to ambulate with crutches, will discharge home and have him f/u in the office this week.  He will call his granddaughter to come get him   [MT]    Clinical Course User Index [MT] Arlynn Mcdermid, Carola Rhine, MD    Final Clinical Impression(s) / ED Diagnoses Final diagnoses:  Ankle fracture, right, closed, initial encounter    Rx / DC Orders ED Discharge Orders         Ordered    ibuprofen (ADVIL) 600 MG tablet  Every 6 hours PRN     07/01/19 1451    oxyCODONE-acetaminophen (PERCOCET/ROXICET) 5-325 MG tablet  Every 6 hours PRN     07/01/19 1451           Wyvonnia Dusky, MD 07/01/19 1729

## 2019-07-02 DIAGNOSIS — C44629 Squamous cell carcinoma of skin of left upper limb, including shoulder: Secondary | ICD-10-CM | POA: Diagnosis not present

## 2019-07-02 DIAGNOSIS — L570 Actinic keratosis: Secondary | ICD-10-CM | POA: Diagnosis not present

## 2019-07-02 DIAGNOSIS — D485 Neoplasm of uncertain behavior of skin: Secondary | ICD-10-CM | POA: Diagnosis not present

## 2019-07-02 DIAGNOSIS — L244 Irritant contact dermatitis due to drugs in contact with skin: Secondary | ICD-10-CM | POA: Diagnosis not present

## 2019-07-05 ENCOUNTER — Telehealth: Payer: Self-pay

## 2019-07-05 NOTE — Telephone Encounter (Signed)
Pt called to report that he fell on Sunday morning and broke his ankle and leg. Pt states that he went to the pharmacy to get his zolpidem refilled and they would only fill half of his Rx stating that pt needs a PA done. Do you know if this has been done?

## 2019-07-05 NOTE — Telephone Encounter (Signed)
No PA done - Dr. Dennard Schaumann only give him 15 to use prn not nightly.

## 2019-07-06 NOTE — Telephone Encounter (Signed)
Left vm on pt's phone.

## 2019-07-09 ENCOUNTER — Other Ambulatory Visit (HOSPITAL_COMMUNITY)
Admission: RE | Admit: 2019-07-09 | Discharge: 2019-07-09 | Disposition: A | Discharge status: Home / Self Care | Payer: Medicare Other | Source: Ambulatory Visit | Attending: Student | Admitting: Student

## 2019-07-09 ENCOUNTER — Ambulatory Visit: Payer: Self-pay | Admitting: Student

## 2019-07-09 DIAGNOSIS — Z01812 Encounter for preprocedural laboratory examination: Secondary | ICD-10-CM | POA: Diagnosis not present

## 2019-07-09 DIAGNOSIS — S82851A Displaced trimalleolar fracture of right lower leg, initial encounter for closed fracture: Secondary | ICD-10-CM

## 2019-07-09 DIAGNOSIS — Z20822 Contact with and (suspected) exposure to covid-19: Secondary | ICD-10-CM | POA: Diagnosis not present

## 2019-07-09 LAB — SARS CORONAVIRUS 2 (TAT 6-24 HRS): SARS Coronavirus 2: NEGATIVE

## 2019-07-09 NOTE — H&P (Signed)
Orthopaedic Trauma Service (OTS) H&P  Patient ID: Brendan Hopkins MRN: [id number] DOB/AGE: [birth year] 82 y.o.  Reason for Surgery: Right trimalleolar ankle fracture  HPI: Brendan Hopkins is an 84 y.o. male presenting for surgery on the right ankle.  Patient sustained a right ankle injury after a fall while at home on 07/01/2019. Was evaluated in [facility] emergency department on day of injury, imaging revealed a right trimalleolar ankle fracture dislocation.  Orthopedics was consulted, requested ankle be reduced by EDP.  Ankle was successfully reduced by EDP and patient was placed in a short leg splint with instructions to follow-up with orthopedic trauma service on outpatient basis to discuss surgical fixation of the fracture.  Patient seen in [place] clinic on 07/09/2019.  He is remained nonweightbearing on the right lower extremity.  Denies any significant numbness or tingling.  History of right total hip replacement by Dr. Alvan Dame in 2014.  Denies any previous injury or surgery to the right ankle.  Denies pain in any of his other extremities.  Has been mobilizing with the use of a knee scooter.  Patient lives at home alone but does have a family member who checks on him twice a day.  History of hypertension, controlled with medication.  Takes aspirin 81 mg daily.   Past Medical History:  Diagnosis Date  . Arthritis    RIGHT HIP, HANDS, BACK  . Cataract   . Cervical radiculopathy    severe C3-4 neuroforaminal stenosis, C5-6 left synovial cyst compressing left nerve root.  . Colon polyps    2005  . COPD (chronic obstructive pulmonary disease) ([place])    SMOKED FOR 3 YRS  . Diverticulosis    2005  . Fatty liver   . GERD (gastroesophageal reflux disease)   . Hypertension   . Pain    RIGHT HIP AND BACK  . Personal history of colonic polyps-adenoma and sessile serrated polyp 07/15/2010  . RBBB    POSS HX AF IN PAST- PAC'S  . Shortness of breath    WITH EXERTION ONLY  . Stroke ([place])    FEW  YEARS AGO - PT SAYS "HEAT STROKE" X 2 - BUT NO PHYSICAL DEFICITS  . TIA (transient ischemic attack)     Past Surgical History:  Procedure Laterality Date  . COLONOSCOPY    . TOTAL HIP ARTHROPLASTY Right 01/09/2013   Procedure: RIGHT TOTAL HIP ARTHROPLASTY ANTERIOR APPROACH;  Surgeon: [name], MD;  Location: [facility] ORS;  Service: Orthopedics;  Laterality: Right;    Family History  Problem Relation Age of Onset  . Cancer Father   . Cancer Mother        died more of old age at age 57  . Colon cancer Neg Hx   . Esophageal cancer Neg Hx   . Prostate cancer Neg Hx   . Rectal cancer Neg Hx   . Stomach cancer Neg Hx     Social History:  reports that he quit smoking about 12 years ago. His smoking use included cigarettes. He quit after 57.00 years of use. He has never used smokeless tobacco. He reports that he does not drink alcohol or use drugs.  Allergies: No Known Allergies  Medications:  No current facility-administered medications on file prior to encounter.   Current Outpatient Medications on File Prior to Encounter  Medication Sig Dispense Refill  . amLODipine (NORVASC) 5 MG tablet Take 1 tablet (5 mg total) by mouth daily. 90 tablet 3  . aspirin EC 81 MG  tablet Take 81 mg by mouth daily.    . benzonatate (TESSALON) 100 MG capsule Take 1 capsule (100 mg total) by mouth every 8 (eight) hours. 21 capsule 0  . fish oil-omega-3 fatty acids 1000 MG capsule Take 1 g by mouth daily.    . hydrochlorothiazide (HYDRODIURIL) 25 MG tablet Take 1 tablet (25 mg total) by mouth daily. 90 tablet 3  . HYDROcodone-homatropine (HYCODAN) 5-1.5 MG/5ML syrup Take 5 mLs by mouth every 8 (eight) hours as needed for cough. 120 mL 0  . ibuprofen (ADVIL) 600 MG tablet Take 1 tablet (600 mg total) by mouth every 6 (six) hours as needed for up to 30 doses for mild pain or moderate pain. 30 tablet 0  . meclizine (ANTIVERT) 25 MG tablet TAKE ONE TABLET BY MOUTH THREE TIMES DAILY AS NEEDED FOR DIZZINESS 30  tablet 2  . Multiple Vitamin (MULTIVITAMIN WITH MINERALS) TABS Take 1 tablet by mouth daily.    . ondansetron (ZOFRAN ODT) 4 MG disintegrating tablet Take 1 tablet (4 mg total) by mouth every 8 (eight) hours as needed for nausea or vomiting. 20 tablet 0  . oxyCODONE-acetaminophen (PERCOCET) 10-325 MG tablet Take 1 tablet by mouth 2 (two) times daily at 10 AM and 5 PM. 60 tablet 0  . oxyCODONE-acetaminophen (PERCOCET/ROXICET) 5-325 MG tablet Take 1 tablet by mouth every 6 (six) hours as needed for up to 5 doses for severe pain. 5 tablet 0  . pantoprazole (PROTONIX) 40 MG tablet Take 1 tablet (40 mg total) by mouth daily. 90 tablet 3  . terbinafine (LAMISIL) 250 MG tablet Take 1 tablet (250 mg total) by mouth daily. 30 tablet 0  . zolpidem (AMBIEN) 10 MG tablet Take 1 tablet (10 mg total) by mouth at bedtime as needed for sleep. 15 tablet 1     ROS: Constitutional: No fever or chills Vision: No changes in vision ENT: No difficulty swallowing CV: No chest pain Pulm: No SOB or wheezing GI: No nausea or vomiting GU: No urgency or inability to hold urine Skin: No poor wound healing Neurologic: No numbness or tingling Psychiatric: No depression or anxiety Heme: No bruising Allergic: No reaction to medications or food   Exam: There were no vitals taken for this visit. General: No acute distress Orientation: Alert and oriented x3 Mood and Affect: Mood and affect appropriate.  Pleasant and cooperative Gait: Nonweightbearing right lower extremity, mobilizes with knee scooter. Coordination and balance: Within normal limits  Right lower extremity: Short leg splint in place.  Nontender above splint.  Splint partially removed to evaluate soft tissue.  Swelling noted to the ankle, skin wrinkles small amount.  Bruising noted throughout the lower leg, ankle, foot.  Sensation is intact to light touch of the dorsal and plantar aspect of the foot.  Patient is able to wiggle his toes.+ EHL. + FHL. + DP  pulse  Left lower extremity: Skin without lesions. No tenderness to palpation. Full painless ROM, full strength in each muscle group without evidence of instability.  Motor and sensory function is intact.  Neurovascularly intact   Medical Decision Making: Data: Imaging: AP and lateral views of the right ankle show fractures involving the medial and lateral malleolus. Tibiotalar joint maintained.    Labs: No results found for this or any previous visit (from the past 24 hour(s)).   Assessment/Plan: 84 year old male status post fall, resulting in right ankle fracture.  Patient sustained a significant injury to the right lower extremity which required surgical fixation in  order to obtain optimal outcome.  Recommend proceeding with open reduction internal fixation of the right ankle.  Risks and benefits of the procedure discussed patient. Risks discussed included bleeding, infection, malunion, nonunion, damage to surrounding nerves and blood vessels, pain, hardware prominence or irritation, hardware failure, stiffness, post-traumatic arthritis, DVT/PE, and anesthesia complications.  Patient states understanding of these risks and agrees to proceed with surgery.  All questions were answered to the patient's satisfaction.  Patient will be admitted for observation postoperatively and will likely be discharged on postoperative day #1.  Hellena Pridgen A. [facility] [phone] (office) [email or web address]

## 2019-07-10 ENCOUNTER — Other Ambulatory Visit: Payer: Self-pay

## 2019-07-10 ENCOUNTER — Encounter (HOSPITAL_COMMUNITY): Payer: Self-pay | Admitting: Student

## 2019-07-10 NOTE — Progress Notes (Addendum)
[name] denies chest pain or shortness of breath. Until accident , Mr Brendan Hopkins worked part time in [employer].  [name] had Covid in November, 2020, patient tested negative 07/08/2018. I instructed Mr Brendan Hopkins to stop Advil, vitamins and herbal products. I instructed patient to not eat anything after midnight, but may drink clear liquids until 0530.  Patient does not have anyone to Mr Brendan Hopkins to pick up Pre- OP Ensure states he will drink black coffee and Gatorade. [name] lives alone and is concerned is there is power outage 2/18 if he will be discharged. I told patient to speak to Dr. And staff on the unit he is admitted on.   Mr takes IBGuard before meals and in the midlde of the night, I left a message asking someone in pharmacy to add medications.  I did inform that it is a herbal product and that he should not take anymore today or in the am.

## 2019-07-11 ENCOUNTER — Other Ambulatory Visit: Payer: Self-pay

## 2019-07-11 ENCOUNTER — Ambulatory Visit (HOSPITAL_COMMUNITY): Payer: Medicare Other | Admitting: Certified Registered Nurse Anesthetist

## 2019-07-11 ENCOUNTER — Observation Stay (HOSPITAL_COMMUNITY): Payer: Medicare Other

## 2019-07-11 ENCOUNTER — Encounter (HOSPITAL_COMMUNITY)
Admission: RE | Disposition: A | Discharge status: Skilled Nursing Facility | Payer: Self-pay | Source: Home / Self Care | Attending: Student

## 2019-07-11 ENCOUNTER — Observation Stay (HOSPITAL_COMMUNITY)
Admission: RE | Admit: 2019-07-11 | Discharge: 2019-07-13 | Disposition: A | Discharge status: Skilled Nursing Facility | Payer: Medicare Other | Attending: Student | Admitting: Student

## 2019-07-11 ENCOUNTER — Encounter (HOSPITAL_COMMUNITY): Payer: Self-pay | Admitting: Student

## 2019-07-11 DIAGNOSIS — I10 Essential (primary) hypertension: Secondary | ICD-10-CM | POA: Insufficient documentation

## 2019-07-11 DIAGNOSIS — Z8673 Personal history of transient ischemic attack (TIA), and cerebral infarction without residual deficits: Secondary | ICD-10-CM | POA: Insufficient documentation

## 2019-07-11 DIAGNOSIS — S82891A Other fracture of right lower leg, initial encounter for closed fracture: Secondary | ICD-10-CM | POA: Diagnosis present

## 2019-07-11 DIAGNOSIS — J449 Chronic obstructive pulmonary disease, unspecified: Secondary | ICD-10-CM | POA: Insufficient documentation

## 2019-07-11 DIAGNOSIS — K219 Gastro-esophageal reflux disease without esophagitis: Secondary | ICD-10-CM | POA: Insufficient documentation

## 2019-07-11 DIAGNOSIS — Z7982 Long term (current) use of aspirin: Secondary | ICD-10-CM | POA: Diagnosis not present

## 2019-07-11 DIAGNOSIS — W19XXXA Unspecified fall, initial encounter: Secondary | ICD-10-CM | POA: Insufficient documentation

## 2019-07-11 DIAGNOSIS — Z20822 Contact with and (suspected) exposure to covid-19: Secondary | ICD-10-CM | POA: Diagnosis not present

## 2019-07-11 DIAGNOSIS — Y92009 Unspecified place in unspecified non-institutional (private) residence as the place of occurrence of the external cause: Secondary | ICD-10-CM | POA: Diagnosis not present

## 2019-07-11 DIAGNOSIS — G8918 Other acute postprocedural pain: Secondary | ICD-10-CM | POA: Diagnosis not present

## 2019-07-11 DIAGNOSIS — S93431A Sprain of tibiofibular ligament of right ankle, initial encounter: Secondary | ICD-10-CM | POA: Insufficient documentation

## 2019-07-11 DIAGNOSIS — S82841D Displaced bimalleolar fracture of right lower leg, subsequent encounter for closed fracture with routine healing: Secondary | ICD-10-CM | POA: Diagnosis not present

## 2019-07-11 DIAGNOSIS — Z85828 Personal history of other malignant neoplasm of skin: Secondary | ICD-10-CM | POA: Diagnosis not present

## 2019-07-11 DIAGNOSIS — Z79899 Other long term (current) drug therapy: Secondary | ICD-10-CM | POA: Insufficient documentation

## 2019-07-11 DIAGNOSIS — Z419 Encounter for procedure for purposes other than remedying health state, unspecified: Secondary | ICD-10-CM

## 2019-07-11 DIAGNOSIS — S8251XD Displaced fracture of medial malleolus of right tibia, subsequent encounter for closed fracture with routine healing: Secondary | ICD-10-CM | POA: Diagnosis not present

## 2019-07-11 DIAGNOSIS — S82851A Displaced trimalleolar fracture of right lower leg, initial encounter for closed fracture: Secondary | ICD-10-CM | POA: Diagnosis not present

## 2019-07-11 DIAGNOSIS — E785 Hyperlipidemia, unspecified: Secondary | ICD-10-CM | POA: Diagnosis not present

## 2019-07-11 DIAGNOSIS — S82391D Other fracture of lower end of right tibia, subsequent encounter for closed fracture with routine healing: Secondary | ICD-10-CM | POA: Diagnosis not present

## 2019-07-11 DIAGNOSIS — S82831D Other fracture of upper and lower end of right fibula, subsequent encounter for closed fracture with routine healing: Secondary | ICD-10-CM | POA: Diagnosis not present

## 2019-07-11 DIAGNOSIS — T148XXA Other injury of unspecified body region, initial encounter: Secondary | ICD-10-CM

## 2019-07-11 HISTORY — PX: ORIF ANKLE FRACTURE: SHX5408

## 2019-07-11 HISTORY — DX: Malignant (primary) neoplasm, unspecified: C80.1

## 2019-07-11 HISTORY — PX: ORIF ANKLE FRACTURE: SUR919

## 2019-07-11 LAB — CBC
HCT: 45.7 % (ref 39.0–52.0)
Hemoglobin: 15.6 g/dL (ref 13.0–17.0)
MCH: 34 pg (ref 26.0–34.0)
MCHC: 34.1 g/dL (ref 30.0–36.0)
MCV: 99.6 fL (ref 80.0–100.0)
Platelets: 209 10*3/uL (ref 150–400)
RBC: 4.59 MIL/uL (ref 4.22–5.81)
RDW: 13.2 % (ref 11.5–15.5)
WBC: 6.7 10*3/uL (ref 4.0–10.5)
nRBC: 0 % (ref 0.0–0.2)

## 2019-07-11 LAB — CREATININE, SERUM
Creatinine, Ser: 1.01 mg/dL (ref 0.61–1.24)
GFR calc Af Amer: >60 mL/min (ref >60)
GFR calc non Af Amer: >60 mL/min (ref >60)

## 2019-07-11 LAB — VITAMIN D 25 HYDROXY (VIT D DEFICIENCY, FRACTURES): Vit D, 25-Hydroxy: 35.64 ng/mL (ref 30–100)

## 2019-07-11 SURGERY — OPEN REDUCTION INTERNAL FIXATION (ORIF) ANKLE FRACTURE
Anesthesia: Regional | Site: Ankle | Laterality: Right

## 2019-07-11 MED ORDER — FENTANYL CITRATE (PF) 250 MCG/5ML IJ SOLN
INTRAMUSCULAR | Status: DC | PRN
  Administered 2019-07-11: 100 ug via INTRAVENOUS
  Administered 2019-07-11: 50 ug via INTRAVENOUS

## 2019-07-11 MED ORDER — LIDOCAINE 2% (20 MG/ML) 5 ML SYRINGE
INTRAMUSCULAR | Status: AC
  Filled 2019-07-11: qty 5

## 2019-07-11 MED ORDER — DOCUSATE SODIUM 100 MG PO CAPS: 100.0000 mg | ORAL_CAPSULE | Freq: Two times a day (BID) | ORAL | Status: DC

## 2019-07-11 MED ORDER — EPHEDRINE 5 MG/ML INJ
INTRAVENOUS | Status: AC
  Filled 2019-07-11: qty 10

## 2019-07-11 MED ORDER — OXYCODONE HCL 5 MG PO TABS: 5.0000 mg | ORAL_TABLET | ORAL | Status: DC | PRN

## 2019-07-11 MED ORDER — HYDROCHLOROTHIAZIDE 25 MG PO TABS
25.0000 mg | ORAL_TABLET | Freq: Every day | ORAL | Status: DC
  Administered 2019-07-11 – 2019-07-13 (×3): 25 mg via ORAL
  Filled 2019-07-11 (×3): qty 1

## 2019-07-11 MED ORDER — PEPPERMINT OIL 90 MG PO CPCR
2.0000 | ORAL_CAPSULE | ORAL | Status: DC
  Administered 2019-07-11 – 2019-07-13 (×9): 2 via ORAL
  Filled 2019-07-11 (×9): qty 2

## 2019-07-11 MED ORDER — ONDANSETRON HCL 4 MG/2ML IJ SOLN
INTRAMUSCULAR | Status: DC | PRN
  Administered 2019-07-11: 4 mg via INTRAVENOUS

## 2019-07-11 MED ORDER — DEXAMETHASONE SODIUM PHOSPHATE 10 MG/ML IJ SOLN
INTRAMUSCULAR | Status: DC | PRN
  Administered 2019-07-11: 4 mg via INTRAVENOUS

## 2019-07-11 MED ORDER — HYDROMORPHONE HCL 1 MG/ML IJ SOLN: 1.0000 mg | INTRAMUSCULAR | Status: DC | PRN

## 2019-07-11 MED ORDER — SODIUM CHLORIDE 0.9 % IV SOLN: INTRAVENOUS | Status: DC

## 2019-07-11 MED ORDER — ONDANSETRON HCL 4 MG PO TABS: 4.0000 mg | ORAL_TABLET | Freq: Four times a day (QID) | ORAL | Status: DC | PRN

## 2019-07-11 MED ORDER — ENOXAPARIN SODIUM 40 MG/0.4ML ~~LOC~~ SOLN: 40.0000 mg | SUBCUTANEOUS | Status: DC

## 2019-07-11 MED ORDER — SUGAMMADEX SODIUM 200 MG/2ML IV SOLN
INTRAVENOUS | Status: DC | PRN
  Administered 2019-07-11: 200 mg via INTRAVENOUS

## 2019-07-11 MED ORDER — METHOCARBAMOL 1000 MG/10ML IJ SOLN
500.0000 mg | Freq: Four times a day (QID) | INTRAVENOUS | Status: DC | PRN
  Filled 2019-07-11: qty 5

## 2019-07-11 MED ORDER — ONDANSETRON HCL 4 MG/2ML IJ SOLN
INTRAMUSCULAR | Status: AC
  Filled 2019-07-11: qty 2

## 2019-07-11 MED ORDER — VANCOMYCIN HCL 1000 MG IV SOLR
INTRAVENOUS | Status: DC | PRN
  Administered 2019-07-11: 1000 mg

## 2019-07-11 MED ORDER — ROCURONIUM BROMIDE 10 MG/ML (PF) SYRINGE
PREFILLED_SYRINGE | INTRAVENOUS | Status: AC
  Filled 2019-07-11: qty 10

## 2019-07-11 MED ORDER — ASPIRIN EC 325 MG PO TBEC
325.0000 mg | DELAYED_RELEASE_TABLET | Freq: Two times a day (BID) | ORAL | Status: DC
  Administered 2019-07-11 – 2019-07-13 (×4): 325 mg via ORAL
  Filled 2019-07-11 (×4): qty 1

## 2019-07-11 MED ORDER — DEXAMETHASONE SODIUM PHOSPHATE 10 MG/ML IJ SOLN
INTRAMUSCULAR | Status: AC
  Filled 2019-07-11: qty 1

## 2019-07-11 MED ORDER — EPHEDRINE SULFATE-NACL 50-0.9 MG/10ML-% IV SOSY
PREFILLED_SYRINGE | INTRAVENOUS | Status: DC | PRN
  Administered 2019-07-11 (×2): 5 mg via INTRAVENOUS

## 2019-07-11 MED ORDER — METOCLOPRAMIDE HCL 5 MG/ML IJ SOLN: 5.0000 mg | Freq: Three times a day (TID) | INTRAMUSCULAR | Status: DC | PRN

## 2019-07-11 MED ORDER — METHOCARBAMOL 500 MG PO TABS
500.0000 mg | ORAL_TABLET | Freq: Four times a day (QID) | ORAL | Status: DC | PRN
  Administered 2019-07-13: 500 mg via ORAL
  Filled 2019-07-11: qty 1

## 2019-07-11 MED ORDER — CEFAZOLIN SODIUM-DEXTROSE 2-4 GM/100ML-% IV SOLN
2.0000 g | Freq: Three times a day (TID) | INTRAVENOUS | Status: AC
  Administered 2019-07-11 – 2019-07-12 (×3): 2 g via INTRAVENOUS
  Filled 2019-07-11 (×3): qty 100

## 2019-07-11 MED ORDER — ROCURONIUM BROMIDE 10 MG/ML (PF) SYRINGE
PREFILLED_SYRINGE | INTRAVENOUS | Status: DC | PRN
  Administered 2019-07-11: 70 mg via INTRAVENOUS

## 2019-07-11 MED ORDER — PHENYLEPHRINE 40 MCG/ML (10ML) SYRINGE FOR IV PUSH (FOR BLOOD PRESSURE SUPPORT)
PREFILLED_SYRINGE | INTRAVENOUS | Status: DC | PRN
  Administered 2019-07-11: 80 ug via INTRAVENOUS
  Administered 2019-07-11: 40 ug via INTRAVENOUS

## 2019-07-11 MED ORDER — TOBRAMYCIN SULFATE 1.2 G IJ SOLR
INTRAMUSCULAR | Status: AC
  Filled 2019-07-11: qty 1.2

## 2019-07-11 MED ORDER — PHENYLEPHRINE 40 MCG/ML (10ML) SYRINGE FOR IV PUSH (FOR BLOOD PRESSURE SUPPORT)
PREFILLED_SYRINGE | INTRAVENOUS | Status: AC
  Filled 2019-07-11: qty 10

## 2019-07-11 MED ORDER — ACETAMINOPHEN 500 MG PO TABS
1000.0000 mg | ORAL_TABLET | Freq: Four times a day (QID) | ORAL | Status: DC
  Administered 2019-07-11 – 2019-07-13 (×8): 1000 mg via ORAL
  Filled 2019-07-11 (×8): qty 2

## 2019-07-11 MED ORDER — POTASSIUM CHLORIDE IN NACL 20-0.9 MEQ/L-% IV SOLN
INTRAVENOUS | Status: DC
  Filled 2019-07-11: qty 1000

## 2019-07-11 MED ORDER — VANCOMYCIN HCL 1000 MG IV SOLR
INTRAVENOUS | Status: AC
  Filled 2019-07-11: qty 1000

## 2019-07-11 MED ORDER — DIPHENHYDRAMINE HCL 12.5 MG/5ML PO ELIX: 12.5000 mg | ORAL_SOLUTION | ORAL | Status: DC | PRN

## 2019-07-11 MED ORDER — CEFAZOLIN SODIUM-DEXTROSE 2-3 GM-%(50ML) IV SOLR
INTRAVENOUS | Status: DC | PRN
  Administered 2019-07-11: 2 g via INTRAVENOUS

## 2019-07-11 MED ORDER — CEFAZOLIN SODIUM-DEXTROSE 2-4 GM/100ML-% IV SOLN
INTRAVENOUS | Status: AC
  Filled 2019-07-11: qty 100

## 2019-07-11 MED ORDER — PROPOFOL 10 MG/ML IV BOLUS
INTRAVENOUS | Status: AC
  Filled 2019-07-11: qty 40

## 2019-07-11 MED ORDER — DOCUSATE SODIUM 100 MG PO CAPS
100.0000 mg | ORAL_CAPSULE | Freq: Two times a day (BID) | ORAL | Status: DC
  Administered 2019-07-11 – 2019-07-13 (×4): 100 mg via ORAL
  Filled 2019-07-11 (×5): qty 1

## 2019-07-11 MED ORDER — LIDOCAINE 2% (20 MG/ML) 5 ML SYRINGE
INTRAMUSCULAR | Status: DC | PRN
  Administered 2019-07-11: 60 mg via INTRAVENOUS

## 2019-07-11 MED ORDER — GABAPENTIN 100 MG PO CAPS
100.0000 mg | ORAL_CAPSULE | Freq: Three times a day (TID) | ORAL | Status: DC
  Administered 2019-07-11 – 2019-07-13 (×7): 100 mg via ORAL
  Filled 2019-07-11 (×7): qty 1

## 2019-07-11 MED ORDER — LACTATED RINGERS IV SOLN: INTRAVENOUS | Status: DC | PRN

## 2019-07-11 MED ORDER — FENTANYL CITRATE (PF) 250 MCG/5ML IJ SOLN
INTRAMUSCULAR | Status: AC
  Filled 2019-07-11: qty 5

## 2019-07-11 MED ORDER — ASPIRIN EC 81 MG PO TBEC
81.0000 mg | DELAYED_RELEASE_TABLET | Freq: Every day | ORAL | Status: DC
  Administered 2019-07-11: 81 mg via ORAL
  Filled 2019-07-11: qty 1

## 2019-07-11 MED ORDER — METOCLOPRAMIDE HCL 5 MG PO TABS: 5.0000 mg | ORAL_TABLET | Freq: Three times a day (TID) | ORAL | Status: DC | PRN

## 2019-07-11 MED ORDER — 0.9 % SODIUM CHLORIDE (POUR BTL) OPTIME
TOPICAL | Status: DC | PRN
  Administered 2019-07-11: 1000 mL

## 2019-07-11 MED ORDER — AMLODIPINE BESYLATE 5 MG PO TABS
5.0000 mg | ORAL_TABLET | Freq: Every day | ORAL | Status: DC
  Administered 2019-07-11 – 2019-07-12 (×2): 5 mg via ORAL
  Filled 2019-07-11 (×2): qty 1

## 2019-07-11 MED ORDER — PANTOPRAZOLE SODIUM 40 MG PO TBEC
40.0000 mg | DELAYED_RELEASE_TABLET | Freq: Every day | ORAL | Status: DC
  Administered 2019-07-11 – 2019-07-13 (×3): 40 mg via ORAL
  Filled 2019-07-11 (×3): qty 1

## 2019-07-11 MED ORDER — ONDANSETRON HCL 4 MG/2ML IJ SOLN: 4.0000 mg | Freq: Four times a day (QID) | INTRAMUSCULAR | Status: DC | PRN

## 2019-07-11 MED ORDER — MECLIZINE HCL 25 MG PO TABS
25.0000 mg | ORAL_TABLET | Freq: Three times a day (TID) | ORAL | Status: DC | PRN
  Filled 2019-07-11: qty 1

## 2019-07-11 MED ORDER — PROPOFOL 10 MG/ML IV BOLUS
INTRAVENOUS | Status: DC | PRN
  Administered 2019-07-11: 160 mg via INTRAVENOUS
  Administered 2019-07-11: 40 mg via INTRAVENOUS

## 2019-07-11 MED ORDER — ZOLPIDEM TARTRATE 5 MG PO TABS: 5.0000 mg | ORAL_TABLET | Freq: Every evening | ORAL | Status: DC | PRN

## 2019-07-11 SURGICAL SUPPLY — 76 items
BANDAGE ESMARK 6X9 LF (GAUZE/BANDAGES/DRESSINGS) IMPLANT
BIT DRILL 2.5MM SMALL QC EVOS (BIT) ×1 IMPLANT
BIT DRILL QC 2.0 SHORT EVOS SM (DRILL) ×1 IMPLANT
BIT DRILL QC 2.5MM SHRT EVO SM (DRILL) ×1 IMPLANT
BNDG COHESIVE 4X5 TAN STRL (GAUZE/BANDAGES/DRESSINGS) ×2 IMPLANT
BNDG COHESIVE 4X5 WHT NS (GAUZE/BANDAGES/DRESSINGS) ×2 IMPLANT
BNDG ELASTIC 4X5.8 VLCR STR LF (GAUZE/BANDAGES/DRESSINGS) IMPLANT
BNDG ELASTIC 6X5.8 VLCR STR LF (GAUZE/BANDAGES/DRESSINGS) ×2 IMPLANT
BNDG ESMARK 6X9 LF (GAUZE/BANDAGES/DRESSINGS)
BRUSH SCRUB EZ PLAIN DRY (MISCELLANEOUS) ×4 IMPLANT
CHLORAPREP W/TINT 26 (MISCELLANEOUS) ×2 IMPLANT
COVER SURGICAL LIGHT HANDLE (MISCELLANEOUS) ×4 IMPLANT
COVER WAND RF STERILE (DRAPES) ×2 IMPLANT
DRAPE C-ARM 42X72 X-RAY (DRAPES) ×2 IMPLANT
DRAPE C-ARMOR (DRAPES) ×2 IMPLANT
DRAPE ORTHO SPLIT 77X108 STRL (DRAPES) ×2
DRAPE SURG ORHT 6 SPLT 77X108 (DRAPES) ×2 IMPLANT
DRAPE U-SHAPE 47X51 STRL (DRAPES) ×2 IMPLANT
DRILL 2.5MM SMALL QC EVOS (BIT) ×2
DRILL QC 2.0 SHORT EVOS SM (DRILL) ×2
DRILL QC 2.5MM SHORT EVOS SM (DRILL) ×2
DRSG ADAPTIC 3X8 NADH LF (GAUZE/BANDAGES/DRESSINGS) IMPLANT
DRSG MEPITEL 4X7.2 (GAUZE/BANDAGES/DRESSINGS) ×2 IMPLANT
ELECT REM PT RETURN 9FT ADLT (ELECTROSURGICAL) ×2
ELECTRODE REM PT RTRN 9FT ADLT (ELECTROSURGICAL) ×1 IMPLANT
GAUZE SPONGE 4X4 12PLY STRL (GAUZE/BANDAGES/DRESSINGS) IMPLANT
GAUZE SPONGE 4X4 12PLY STRL LF (GAUZE/BANDAGES/DRESSINGS) ×2 IMPLANT
GLOVE BIO SURGEON STRL SZ 6.5 (GLOVE) ×6 IMPLANT
GLOVE BIO SURGEON STRL SZ7.5 (GLOVE) ×6 IMPLANT
GLOVE BIOGEL PI IND STRL 6.5 (GLOVE) ×1 IMPLANT
GLOVE BIOGEL PI IND STRL 7.5 (GLOVE) ×1 IMPLANT
GLOVE BIOGEL PI INDICATOR 6.5 (GLOVE) ×1
GLOVE BIOGEL PI INDICATOR 7.5 (GLOVE) ×1
GOWN STRL REUS W/ TWL LRG LVL3 (GOWN DISPOSABLE) ×2 IMPLANT
GOWN STRL REUS W/TWL LRG LVL3 (GOWN DISPOSABLE) ×2
KIT TURNOVER KIT B (KITS) ×2 IMPLANT
MANIFOLD NEPTUNE II (INSTRUMENTS) IMPLANT
NEEDLE HYPO 21X1.5 SAFETY (NEEDLE) IMPLANT
NEEDLE HYPO 25GX1X1/2 BEV (NEEDLE) IMPLANT
NS IRRIG 1000ML POUR BTL (IV SOLUTION) ×2 IMPLANT
PACK TOTAL JOINT (CUSTOM PROCEDURE TRAY) ×2 IMPLANT
PAD ARMBOARD 7.5X6 YLW CONV (MISCELLANEOUS) ×4 IMPLANT
PAD CAST 4YDX4 CTTN HI CHSV (CAST SUPPLIES) ×1 IMPLANT
PADDING CAST COTTON 4X4 STRL (CAST SUPPLIES) ×1
PADDING CAST COTTON 6X4 STRL (CAST SUPPLIES) ×2 IMPLANT
PADDING CAST SYN 6 (CAST SUPPLIES) ×1
PADDING CAST SYNTHETIC 4 (CAST SUPPLIES) ×1
PADDING CAST SYNTHETIC 4X4 STR (CAST SUPPLIES) ×1 IMPLANT
PADDING CAST SYNTHETIC 6X4 NS (CAST SUPPLIES) ×1 IMPLANT
PIN PROV FIXATION 2.0X14 (WIRE) ×2 IMPLANT
PLATE FIB EVOS 2.7/3.5 7H R103 (Plate) ×2 IMPLANT
SCREW CORT 2.7X17 T8 ST EVOS (Screw) ×4 IMPLANT
SCREW CORT 2.7X19 ST STAR EVOS (Screw) ×2 IMPLANT
SCREW CORT 3.5X13MM (Screw) ×2 IMPLANT
SCREW CORT 3.5X15 ST EVOS (Screw) ×2 IMPLANT
SCREW CORT 3.5X16 ST EVOS (Screw) ×2 IMPLANT
SCREW CORT ST EVOS 3.5X60 (Screw) ×4 IMPLANT
SCREW CORT ST EVOS 3.5X70 (Screw) ×2 IMPLANT
SCREW LOCK ST EVOS 2.7X20 (Screw) ×4 IMPLANT
SPLINT PLASTER CAST XFAST 5X30 (CAST SUPPLIES) ×1 IMPLANT
SPLINT PLASTER XFAST SET 5X30 (CAST SUPPLIES) ×1
SPONGE LAP 18X18 RF (DISPOSABLE) IMPLANT
STAPLER VISISTAT 35W (STAPLE) IMPLANT
SUCTION FRAZIER HANDLE 10FR (MISCELLANEOUS) ×1
SUCTION TUBE FRAZIER 10FR DISP (MISCELLANEOUS) ×1 IMPLANT
SUT ETHILON 3 0 PS 1 (SUTURE) ×4 IMPLANT
SUT PROLENE 0 CT (SUTURE) IMPLANT
SUT VIC AB 0 CT1 27 (SUTURE) ×1
SUT VIC AB 0 CT1 27XBRD ANBCTR (SUTURE) ×1 IMPLANT
SUT VIC AB 2-0 CT1 27 (SUTURE) ×3
SUT VIC AB 2-0 CT1 TAPERPNT 27 (SUTURE) ×3 IMPLANT
SYR CONTROL 10ML LL (SYRINGE) IMPLANT
TOWEL GREEN STERILE (TOWEL DISPOSABLE) ×4 IMPLANT
TOWEL GREEN STERILE FF (TOWEL DISPOSABLE) ×2 IMPLANT
UNDERPAD 30X30 (UNDERPADS AND DIAPERS) ×2 IMPLANT
WATER STERILE IRR 1000ML POUR (IV SOLUTION) ×2 IMPLANT

## 2019-07-11 NOTE — Transfer of Care (Signed)
Immediate Anesthesia Transfer of Care Note  Patient: Brendan Hopkins  Procedure(s) Performed: OPEN REDUCTION INTERNAL FIXATION (ORIF) ANKLE FRACTURE (Right Ankle)  Patient Location: PACU  Anesthesia Type:GA combined with regional for post-op pain  Level of Consciousness: oriented, drowsy and patient cooperative  Airway & Oxygen Therapy: Patient Spontanous Breathing and Patient connected to nasal cannula oxygen  Post-op Assessment: Report given to RN and Post -op Vital signs reviewed and stable  Post vital signs: Reviewed and stable  Last Vitals:  Vitals Value Taken Time  BP 138/76 07/11/19 0959  Temp 36.6 C 07/11/19 1000  Pulse 73 07/11/19 1001  Resp 16 07/11/19 1001  SpO2 96 % 07/11/19 1001  Vitals shown include unvalidated device data.  Last Pain:  Vitals:   07/11/19 1000  TempSrc:   PainSc: 0-No pain         Complications: No apparent anesthesia complications

## 2019-07-11 NOTE — Op Note (Signed)
Orthopaedic Surgery Operative Note (CSN: [id number] ) Date of Surgery: 07/11/2019  Admit Date: 07/11/2019   Diagnoses: Pre-Op Diagnoses: Right trimalleolar ankle fracture  Post-Op Diagnosis: Same  Procedures: 1. CPT A215606 reduction internal fixation of right trimalleolar ankle fracture 2. CPT 27829-Open reduction internal fixation of right syndesmotic disruption  Surgeons : Primary: Quintella Mura, Thomasene Lot, MD  Assistant: Patrecia Pace, PA-C  Location: OR 3  Anesthesia:General with regional anesthesia  Antibiotics: Ancef 2g preop with 1 gram vancomycin topically  Tourniquet time:None used  Estimated Blood 123456 mL  Complications:None   Specimens:None  Implants: Implant Name Type Inv. Item Serial No. Manufacturer Lot No. LRB No. Used Action  SCREW CORTEX 3.5X16 - [id number] Screw SCREW CORTEX 3.5X16  [employer]  Right 1 Implanted  SCREW CORTEX 3.5X15 EVOS - [id number] Screw SCREW CORTEX 3.5X15 EVOS  [employer]  Right 1 Implanted  SCREW CORTEX 3.5X13MM - [id number] Screw SCREW CORTEX 3.5X13MM  [employer]  Right 1 Implanted  PLATE FIB EVOS 075-GRM 7H R103 - [id number] Plate PLATE FIB EVOS 075-GRM 7H R103  [employer]  Right 1 Implanted  SCREW LOCK ST EVOS 2.7X20 [id number] Screw SCREW LOCK ST EVOS 2.7X20  [employer]  Right 2 Implanted  SCREW CORT EVOS ST T8 2.7X17 - [id number] Screw SCREW CORT EVOS ST T8 2.7X17  [employer]  Right 2 Implanted  SCREW LOCK ST EVOS 2.7X19 [id number] Screw SCREW LOCK ST EVOS 2.7X19  [employer]  Right 1 Implanted  SCREW CORT ST EVOS 3.5X70 - [id number] Screw SCREW CORT ST EVOS 3.5X70  [employer]  Right 1 Implanted  SCREW CORT ST EVOS 3.5X60 [id number] Screw SCREW CORT ST EVOS 3.5X60  [employer]  Right 2 Implanted     Indications for Surgery: 84 year old male who tripped and fell down a hill  sustaining a right closed trimalleolar ankle fracture dislocation.  He was reduced in the emergency room and splinted.  Due to the unstable nature of his injury I recommended proceeding to the operating room for open reduction internal fixation.  Risks and benefits were discussed with the patient.  Risks included but not limited to bleeding, infection, malunion, nonunion, hardware failure, hardware irritation, nerve and blood vessel injury, posttraumatic arthritis, ankle stiffness, DVT, even the possibility anesthetic complications.  Patient agreed to proceed with surgery and consent was obtained.  Operative Findings: 1.  Open reduction internal fixation of right trimalleolar ankle fracture using [employer] EVOS distal fibular locking plate and independent 3.5 millimeter screws for the medial malleolus. 2.  Medial clear space widening with external rotation stress view treated with reduction and fixation using a 3.5 mm nonlocking screw. 3.  2.5 mm drill bit that was fractured within the tibia that was left in place  Procedure: The patient was identified in the preoperative holding area. Consent was confirmed with the patient and their family and all questions were answered. The operative extremity was marked after confirmation with the patient. he was then brought back to the operating room by our anesthesia colleagues.  He was placed under general anesthetic and carefully transferred over to a radiolucent flat top table.  The right lower extremity was then prepped and draped in usual sterile fashion.  A timeout was performed to verify the patient, the procedure, and the extremity.  Preoperative antibiotics were dosed.  Fluoroscopic imaging was obtained to show the unstable nature of  his injury.  A direct lateral approach was made to the fibula carried down through skin and subcutaneous tissue.  I carefully dissected to protect the superficial peroneal nerve.  The fracture was identified.  It is highly  comminuted and as result I felt that bridging the fracture would be most appropriate with a distal fibular locking plate.  A 7 hole Smith [employer] Nephew plate was placed on the lateral aspect and held provisionally distally.  A clamp was used to reduce the shaft of the fibula to the plate.  A nonlocking screw was placed in the fibular shaft.  Fluoroscopic imaging confirmed anatomic reduction of the fracture in the mortise.  I then placed locking screws in the distal segment.  These were 2.7 millimeter screws.  I returned to the fibular shaft and placed another 2 nonlocking screws.  Once the fibula was fixed I turned my attention to the medial side.  A curvilinear incision was made along the medial malleolus.  Carried down through skin and subcutaneous tissue.  I carefully dissected out the saphenous neurovascular bundle and protected this through the procedure.  The fracture was visualized periosteum and hematoma was removed.  A reduction tenaculum was used to anatomically reduce the fracture.  I then used fluoroscopy to drill bicortically across the medial malleolus and into the lateral cortex of the tibia metaphysis.  I placed a nonlocking 3.5 millimeter screw.  The process was repeated posteriorly to the first screw.  Unfortunately due to the angle and the quality of his bone the drill bit broke off.  As a result of having already fixed the medial malleolus and the invasiveness of her retrieving the drill bit I felt that leaving this in place would be most appropriate and provide the least amount of risk.  I change the drill bits and then drilled bicortically again and placed a 3.5 millimeter screw.  Excellent fixation was obtained.  An external rotation stress view was then obtained with the fixation in place.  There was medial clear space widening.  As result the ankle was dorsiflexed in full dorsiflexion.  Medial pressure was provided to the fibula to reduce it anatomically in the incisura and then a Quadra  cortical 3.5 millimeter screw was drilled and placed across both the fibula and tibia.  A repeat external rotation stress view was obtained which showed no medial clear space widening.  The incision was copiously irrigated.  A gram of vancomycin powder was placed between the 2 incisions.  A layered closure of 2-0 Vicryl and 3-0 nylon was used to close the hand.  Sterile dressing consisting of Mepitel, 4 x 4's, sterile cast padding and a well-padded short leg splint was placed.  He was then awoken from anesthesia and taken the PACU in stable condition.  Post Op Plan/Instructions: Patient will be nonweightbearing to the right lower extremity.  He will be admitted overnight for observation.  He will receive postoperative Ancef.  He will be placed on aspirin for DVT prophylaxis.  He will be discharged home tomorrow.  We will likely have him mobilize with physical therapy while he is in the hospital.  I was present and performed the entire surgery.  Patrecia Pace, PA-C did assist me throughout the case. An assistant was necessary given the difficulty in approach, maintenance of reduction and ability to instrument the fracture.   Katha Hamming, MD [facility]

## 2019-07-11 NOTE — Anesthesia Procedure Notes (Signed)
Procedure Name: Intubation Date/Time: 07/11/2019 8:41 AM Performed by: Colin Benton, CRNA Pre-anesthesia Checklist: Patient identified, Emergency Drugs available, Suction available and Patient being monitored Patient Re-evaluated:Patient Re-evaluated prior to induction Oxygen Delivery Method: Circle system utilized Preoxygenation: Pre-oxygenation with 100% oxygen Induction Type: IV induction Ventilation: Mask ventilation without difficulty and Oral airway inserted - appropriate to patient size Laryngoscope Size: Miller and 3 Grade View: Grade I Tube type: Oral Tube size: 7.5 mm Number of attempts: 1 Airway Equipment and Method: Stylet Placement Confirmation: ETT inserted through vocal cords under direct vision,  positive ETCO2 and breath sounds checked- equal and bilateral Secured at: 23 cm Tube secured with: Tape Dental Injury: Teeth and Oropharynx as per pre-operative assessment

## 2019-07-11 NOTE — Plan of Care (Signed)

## 2019-07-11 NOTE — Evaluation (Signed)
Physical Therapy Evaluation Patient Details Name: Brendan Hopkins MRN: [id number] DOB: Oct 10, 1935 Today's Date: 07/11/2019   History of Present Illness  Pt is an 84 y/o male s/p ORIF of R ankle fx. PMH includes COPD, GERD, HTN and R THA.   Clinical Impression  Pt is s/p surgery above with deficits below. Pt requiring min guard A to stand and take hop steps at EOB. Able to maintain NWB, however, fatigues easily. Pt reports he is concerned about not being able to do what he needs to do at d/c, however, does not want to stay away from his dogs at d/c. Feel pt may benefit from [facility] for longer distances if he decides to go home. Will continue to follow acutely to maximize functional mobility independence and safety.     Follow Up Recommendations Supervision for mobility/OOB(HHPT vs SNF )    Equipment Recommendations  Wheelchair cushion (measurements PT);Wheelchair (measurements PT)    Recommendations for Other Services       Precautions / Restrictions Precautions Precautions: Fall Restrictions Weight Bearing Restrictions: Yes RLE Weight Bearing: Non weight bearing      Mobility  Bed Mobility Overal bed mobility: Needs Assistance Bed Mobility: Supine to Sit     Supine to sit: Supervision     General bed mobility comments: Supervision for safety.   Transfers Overall transfer level: Needs assistance Equipment used: Rolling walker (2 wheeled) Transfers: Sit to/from Stand Sit to Stand: Min guard         General transfer comment: Min guard for safety. Cues for safe hand placement.   Ambulation/Gait Ambulation/Gait assistance: Min guard   Assistive device: Rolling walker (2 wheeled)       General Gait Details: Took hop steps at EOB. Pt with increased fatigue so further mobility deferred.   Stairs            Wheelchair Mobility    Modified Rankin (Stroke Patients Only)       Balance Overall balance assessment: Needs assistance Sitting-balance support: No  upper extremity supported;Feet supported Sitting balance-Leahy Scale: Fair     Standing balance support: Bilateral upper extremity supported;During functional activity Standing balance-Leahy Scale: Poor Standing balance comment: Reliant on BUE support                              Pertinent Vitals/Pain Pain Assessment: No/denies pain    Home Living Family/patient expects to be discharged to:: Private residence Living Arrangements: Alone Available Help at Discharge: Family Type of Home: House Home Access: Ramped entrance(hommade ramp)   Entrance Stairs-Number of Steps: 1 Home Layout: One level Home Equipment: Walker - 2 wheels;Other (comment);Bedside commode(knee scooter)      Prior Function Level of Independence: Independent with assistive device(s)         Comments: was using knee scooter since he fell     Hand Dominance        Extremity/Trunk Assessment   Upper Extremity Assessment Upper Extremity Assessment: Defer to OT evaluation    Lower Extremity Assessment Lower Extremity Assessment: RLE deficits/detail RLE Deficits / Details: Deficits consistent with post op pain and weakness.     Cervical / Trunk Assessment Cervical / Trunk Assessment: Normal  Communication   Communication: No difficulties  Cognition Arousal/Alertness: Awake/alert Behavior During Therapy: WFL for tasks assessed/performed Overall Cognitive Status: Within Functional Limits for tasks assessed  General Comments      Exercises     Assessment/Plan    PT Assessment Patient needs continued PT services  PT Problem List Decreased strength;Decreased balance;Decreased mobility;Decreased activity tolerance;Decreased knowledge of use of DME       PT Treatment Interventions Gait training;DME instruction;Functional mobility training;Therapeutic activities;Therapeutic exercise;Balance training;Patient/family  education;Wheelchair mobility training    PT Goals (Current goals can be found in the Care Plan section)  Acute Rehab PT Goals Patient Stated Goal: to be able to get around where I need to  PT Goal Formulation: With patient Time For Goal Achievement: 07/25/19 Potential to Achieve Goals: Good    Frequency Min 5X/week   Barriers to discharge Decreased caregiver support      Co-evaluation               AM-PAC PT "6 Clicks" Mobility  Outcome Measure Help needed turning from your back to your side while in a flat bed without using bedrails?: None Help needed moving from lying on your back to sitting on the side of a flat bed without using bedrails?: None Help needed moving to and from a bed to a chair (including a wheelchair)?: A Little Help needed standing up from a chair using your arms (e.g., wheelchair or bedside chair)?: A Little Help needed to walk in hospital room?: A Lot Help needed climbing 3-5 steps with a railing? : A Lot 6 Click Score: 18    End of Session Equipment Utilized During Treatment: Gait belt Activity Tolerance: Patient tolerated treatment well Patient left: in bed;with call bell/phone within reach;with bed alarm set(sitting EOB ) Nurse Communication: Mobility status PT Visit Diagnosis: Unsteadiness on feet (R26.81);History of falling (Z91.81)    Time: 1416-1440 PT Time Calculation (min) (ACUTE ONLY): 24 min   Charges:   PT Evaluation $PT Eval Low Complexity: 1 Low PT Treatments $Therapeutic Activity: 8-22 mins        Lou Miner, DPT  Acute Rehabilitation Services  Pager: [phone] Office: [phone]   Rudean Hitt 07/11/2019, 3:18 PM

## 2019-07-12 DIAGNOSIS — J449 Chronic obstructive pulmonary disease, unspecified: Secondary | ICD-10-CM | POA: Diagnosis not present

## 2019-07-12 DIAGNOSIS — K219 Gastro-esophageal reflux disease without esophagitis: Secondary | ICD-10-CM | POA: Diagnosis not present

## 2019-07-12 DIAGNOSIS — Z79899 Other long term (current) drug therapy: Secondary | ICD-10-CM | POA: Diagnosis not present

## 2019-07-12 DIAGNOSIS — Z7982 Long term (current) use of aspirin: Secondary | ICD-10-CM | POA: Diagnosis not present

## 2019-07-12 DIAGNOSIS — Z8673 Personal history of transient ischemic attack (TIA), and cerebral infarction without residual deficits: Secondary | ICD-10-CM | POA: Diagnosis not present

## 2019-07-12 DIAGNOSIS — S93431A Sprain of tibiofibular ligament of right ankle, initial encounter: Secondary | ICD-10-CM | POA: Diagnosis not present

## 2019-07-12 DIAGNOSIS — I10 Essential (primary) hypertension: Secondary | ICD-10-CM | POA: Diagnosis not present

## 2019-07-12 DIAGNOSIS — Z20822 Contact with and (suspected) exposure to covid-19: Secondary | ICD-10-CM | POA: Diagnosis not present

## 2019-07-12 DIAGNOSIS — S82851A Displaced trimalleolar fracture of right lower leg, initial encounter for closed fracture: Secondary | ICD-10-CM | POA: Diagnosis not present

## 2019-07-12 DIAGNOSIS — Z85828 Personal history of other malignant neoplasm of skin: Secondary | ICD-10-CM | POA: Diagnosis not present

## 2019-07-12 LAB — CBC
HCT: 41.4 % (ref 39.0–52.0)
Hemoglobin: 14 g/dL (ref 13.0–17.0)
MCH: 33.4 pg (ref 26.0–34.0)
MCHC: 33.8 g/dL (ref 30.0–36.0)
MCV: 98.8 fL (ref 80.0–100.0)
Platelets: 210 10*3/uL (ref 150–400)
RBC: 4.19 MIL/uL — ABNORMAL LOW (ref 4.22–5.81)
RDW: 13 % (ref 11.5–15.5)
WBC: 10.2 10*3/uL (ref 4.0–10.5)
nRBC: 0 % (ref 0.0–0.2)

## 2019-07-12 LAB — BASIC METABOLIC PANEL
Anion gap: 11 (ref 5–15)
BUN: 20 mg/dL (ref 8–23)
CO2: 23 mmol/L (ref 22–32)
Calcium: 8.9 mg/dL (ref 8.9–10.3)
Chloride: 104 mmol/L (ref 98–111)
Creatinine, Ser: 0.99 mg/dL (ref 0.61–1.24)
GFR calc Af Amer: >60 mL/min (ref >60)
GFR calc non Af Amer: >60 mL/min (ref >60)
Glucose, Bld: 124 mg/dL — ABNORMAL HIGH (ref 70–99)
Potassium: 3.9 mmol/L (ref 3.5–5.1)
Sodium: 138 mmol/L (ref 135–145)

## 2019-07-12 LAB — SARS CORONAVIRUS 2 (TAT 6-24 HRS): SARS Coronavirus 2: NEGATIVE

## 2019-07-12 MED ORDER — BUPIVACAINE HCL (PF) 0.5 % IJ SOLN
INTRAMUSCULAR | Status: DC | PRN
  Administered 2019-07-11: 30 mL via PERINEURAL

## 2019-07-12 MED ORDER — HYDROCODONE-ACETAMINOPHEN 5-325 MG PO TABS: 1.0000 | ORAL_TABLET | Freq: Four times a day (QID) | ORAL | 0 refills | Status: DC | PRN

## 2019-07-12 MED ORDER — ROPIVACAINE HCL 7.5 MG/ML IJ SOLN
INTRAMUSCULAR | Status: DC | PRN
  Administered 2019-07-11: 12 mL via PERINEURAL

## 2019-07-12 MED ORDER — ASPIRIN 325 MG PO TBEC: 325.0000 mg | DELAYED_RELEASE_TABLET | Freq: Two times a day (BID) | ORAL | 0 refills | Status: DC

## 2019-07-12 MED ORDER — VITAMIN D 25 MCG (1000 UNIT) PO TABS
2000.0000 [IU] | ORAL_TABLET | Freq: Every day | ORAL | Status: DC
  Administered 2019-07-12 – 2019-07-13 (×2): 2000 [IU] via ORAL
  Filled 2019-07-12 (×2): qty 2

## 2019-07-12 NOTE — Anesthesia Procedure Notes (Signed)
Anesthesia Regional Block: Adductor canal block   Pre-Anesthetic Checklist: ,, timeout performed, Correct Patient, Correct Site, Correct Laterality, Correct Procedure, Correct Position, site marked, Risks and benefits discussed,  Surgical consent,  Pre-op evaluation,  At surgeon's request and post-op pain management  Laterality: Right and Lower  Prep: chloraprep       Needles:  Injection technique: Single-shot     Needle Length: 9cm  Needle Gauge: 22     Additional Needles: Arrow StimuQuik ECHO Echogenic Stimulating PNB Needle  Procedures:,,,, ultrasound used (permanent image in chart),,,,  Narrative:  Start time: 07/11/2019 8:22 AM End time: 07/11/2019 8:29 AM Injection made incrementally with aspirations every 5 mL.  Performed by: Personally  Anesthesiologist: Oleta Mouse, MD

## 2019-07-12 NOTE — Care Management (Signed)
    Durable Medical Equipment  (From admission, onward)         Start     Ordered   07/11/19 1806  For home use only DME standard manual wheelchair with seat cushion  Once    Comments: Patient suffers from right trimalleolar ankle fracture which impairs their ability to perform daily activities in the home.  A crutch/walker will not resolve issue with performing activities of daily living. A wheelchair will allow patient to safely perform daily activities. Patient can safely propel the wheelchair in the home or has a caregiver who can provide assistance. Length of need 3 months Accessories: elevating leg rests (ELRs), wheel locks, extensions and anti-tippers.   07/11/19 1806

## 2019-07-12 NOTE — Progress Notes (Signed)
Orthopaedic Trauma Progress Note  S: Spoke to patient via telephone.  Doing well this morning, just finished eating breakfast.  Nerve block has not worn off yet.  Decided he would prefer to go to SNF versus home with home health due to not having any available help at home.  O:  Vitals:   07/11/19 2313 07/12/19 0422  BP: 125/81 123/76  Pulse: (!) 54 (!) 55  Resp: 17 18  Temp: 98.3 F (36.8 C) 97.8 F (36.6 C)  SpO2: 96% 92%    General - Sitting up in bed, no acute distress  Right lower extremity - Short leg splint in place.  Nerve block still in full effect.  Unable to wiggle toes.  Sensation intact to light touch above splint.  Able to bend his knee a small amount.  Imaging: Stable post op imaging.  Labs:  Results for orders placed or performed during the hospital encounter of 07/11/19 (from the past 24 hour(s))  VITAMIN D 25 Hydroxy (Vit-D Deficiency, Fractures)     Status: None   Collection Time: 07/11/19 11:31 AM  Result Value Ref Range   Vit D, 25-Hydroxy 35.64 30 - 100 ng/mL  CBC     Status: None   Collection Time: 07/11/19 11:31 AM  Result Value Ref Range   WBC 6.7 4.0 - 10.5 K/uL   RBC 4.59 4.22 - 5.81 MIL/uL   Hemoglobin 15.6 13.0 - 17.0 g/dL   HCT 45.7 39.0 - 52.0 %   MCV 99.6 80.0 - 100.0 fL   MCH 34.0 26.0 - 34.0 pg   MCHC 34.1 30.0 - 36.0 g/dL   RDW 13.2 11.5 - 15.5 %   Platelets 209 150 - 400 K/uL   nRBC 0.0 0.0 - 0.2 %  Creatinine, serum     Status: None   Collection Time: 07/11/19 11:31 AM  Result Value Ref Range   Creatinine, Ser 1.01 0.61 - 1.24 mg/dL   GFR calc non Af Amer >60 >60 mL/min   GFR calc Af Amer >60 >60 mL/min  Basic metabolic panel     Status: Abnormal   Collection Time: 07/12/19  1:22 AM  Result Value Ref Range   Sodium 138 135 - 145 mmol/L   Potassium 3.9 3.5 - 5.1 mmol/L   Chloride 104 98 - 111 mmol/L   CO2 23 22 - 32 mmol/L   Glucose, Bld 124 (H) 70 - 99 mg/dL   BUN 20 8 - 23 mg/dL   Creatinine, Ser 0.99 0.61 - 1.24 mg/dL   Calcium 8.9 8.9 - 10.3 mg/dL   GFR calc non Af Amer >60 >60 mL/min   GFR calc Af Amer >60 >60 mL/min   Anion gap 11 5 - 15  CBC     Status: Abnormal   Collection Time: 07/12/19  1:22 AM  Result Value Ref Range   WBC 10.2 4.0 - 10.5 K/uL   RBC 4.19 (L) 4.22 - 5.81 MIL/uL   Hemoglobin 14.0 13.0 - 17.0 g/dL   HCT 41.4 39.0 - 52.0 %   MCV 98.8 80.0 - 100.0 fL   MCH 33.4 26.0 - 34.0 pg   MCHC 33.8 30.0 - 36.0 g/dL   RDW 13.0 11.5 - 15.5 %   Platelets 210 150 - 400 K/uL   nRBC 0.0 0.0 - 0.2 %    Assessment: 84 year old male status post fall, 1 Day Post-Op   Injuries: Right trimalleolar ankle fracture status post ORIF  Weightbearing: NWB RLE  Insicional and dressing  care: Dressings left intact until follow-up   Orthopedic device(s): Splint RLE  CV/Blood loss: Hemoglobin 14.0 this morning. Hemodynamically stable  Pain management:  1. Tylenol 1000 mg q 6 hours scheduled 2. Robaxin 500 mg q 6 hours PRN 3. Oxycodone 5-10 mg q 4 hours PRN 4. Neurontin 100 mg TID 5. Dilaudid 1 mg q 3 hours PRN  VTE prophylaxis: Aspirin 325 twice daily.  This will be continued at discharge  ID: Ancef 2gm post op  Foley/Lines: No foley, KVO IVFs  Medical co-morbidities: Hypertension, GERD, irritable bowel syndrome  Impediments to fracture healing: Vitamin D level 35, is within normal limits.  Recommend starting vitamin D3 2000 units daily at discharge  Dispo: Up with therapy as tolerated.  PT recommending SNF.  Patient is in agreement to this.  SW/CM following, will get this process started today.    Follow - up plan: 2 weeks for splint removal and suture removal  Contact information:  Katha Hamming MD, Patrecia Pace PA-C   [facility] [phone] (office) [email or web address]

## 2019-07-12 NOTE — Anesthesia Postprocedure Evaluation (Signed)
Anesthesia Post Note  Patient: Brendan Hopkins  Procedure(s) Performed: OPEN REDUCTION INTERNAL FIXATION (ORIF) ANKLE FRACTURE (Right Ankle)     Patient location during evaluation: PACU Anesthesia Type: Regional and General Level of consciousness: awake and alert Pain management: pain level controlled Vital Signs Assessment: post-procedure vital signs reviewed and stable Respiratory status: spontaneous breathing, nonlabored ventilation, respiratory function stable and patient connected to nasal cannula oxygen Cardiovascular status: blood pressure returned to baseline and stable Postop Assessment: no apparent nausea or vomiting Anesthetic complications: no    Last Vitals:  Vitals:   07/11/19 2313 07/12/19 0422  BP: 125/81 123/76  Pulse: (!) 54 (!) 55  Resp: 17 18  Temp: 36.8 C 36.6 C  SpO2: 96% 92%    Last Pain:  Vitals:   07/12/19 0246  TempSrc:   PainSc: Asleep                 Zahari Xiang

## 2019-07-12 NOTE — Evaluation (Signed)
Occupational Therapy Evaluation Patient Details Name: Brendan Hopkins MRN: [id number] DOB: 05/17/1936 Today's Date: 07/12/2019    History of Present Illness Pt is an 84 y/o male s/p ORIF of R ankle fx. PMH includes COPD, GERD, HTN and R THA.    Clinical Impression   Pt typically functions independently. Presents with impaired standing balance and decreased activity tolerance. Pt requires min guard to min assist for ADL and ADL transfers. He lives alone and reports managing since his fall has been difficult. Recommending ST rehab in SNF and pt is in agreement. Will follow acutely.    Follow Up Recommendations  SNF    Equipment Recommendations  None recommended by OT    Recommendations for Other Services       Precautions / Restrictions Precautions Precautions: Fall Restrictions Weight Bearing Restrictions: Yes RLE Weight Bearing: Non weight bearing      Mobility Bed Mobility    General bed mobility comments: received in chair  Transfers Overall transfer level: Needs assistance Equipment used: Rolling walker (2 wheeled) Transfers: Sit to/from Stand Sit to Stand: Min guard         General transfer comment: good technique    Balance Overall balance assessment: Needs assistance Sitting-balance support: No upper extremity supported;Feet supported Sitting balance-Leahy Scale: Good     Standing balance support: Bilateral upper extremity supported;During functional activity Standing balance-Leahy Scale: Poor Standing balance comment: Reliant on BUE support                            ADL either performed or assessed with clinical judgement   ADL Overall ADL's : Needs assistance/impaired Eating/Feeding: Independent   Grooming: Min guard;Sitting;Wash/dry hands   Upper Body Bathing: Set up;Sitting   Lower Body Bathing: Minimal assistance;Sit to/from stand   Upper Body Dressing : Set up;Sitting   Lower Body Dressing: Minimal assistance;Sit to/from  stand   Toilet Transfer: Min guard;Ambulation;RW   Toileting- Water quality scientist and Hygiene: Min guard;Sit to/from stand       Functional mobility during ADLs: Min guard;Rolling walker       Vision Patient Visual Report: No change from baseline       Perception     Praxis      Pertinent Vitals/Pain Pain Assessment: No/denies pain     Hand Dominance Right   Extremity/Trunk Assessment Upper Extremity Assessment Upper Extremity Assessment: Overall WFL for tasks assessed   Lower Extremity Assessment Lower Extremity Assessment: Defer to PT evaluation   Cervical / Trunk Assessment Cervical / Trunk Assessment: Normal   Communication Communication Communication: No difficulties   Cognition Arousal/Alertness: Awake/alert Behavior During Therapy: WFL for tasks assessed/performed Overall Cognitive Status: Within Functional Limits for tasks assessed                                     General Comments       Exercises   Shoulder Instructions      Home Living Family/patient expects to be discharged to:: Private residence Living Arrangements: Alone Available Help at Discharge: Family Type of Home: House Home Access: Ramped entrance Entrance Stairs-Number of Steps: 1   Home Layout: One level     Bathroom Shower/Tub: Teacher, early years/pre: Standard     Home Equipment: Environmental consultant - 2 wheels;Other (comment);Bedside commode(knee scooter)          Prior Functioning/Environment Level of  Independence: Independent with assistive device(s)        Comments: was using knee scooter since he fell        OT Problem List: Impaired balance (sitting and/or standing)      OT Treatment/Interventions: Self-care/ADL training;DME and/or AE instruction;Patient/family education;Balance training    OT Goals(Current goals can be found in the care plan section) Acute Rehab OT Goals Patient Stated Goal: to be able to get around where I need to   OT Goal Formulation: With patient Time For Goal Achievement: 07/26/19 Potential to Achieve Goals: Good ADL Goals Pt Will Perform Grooming: with modified independence;standing;sitting(at sink) Pt Will Perform Lower Body Bathing: with modified independence;sit to/from stand Pt Will Perform Lower Body Dressing: with modified independence;sit to/from stand Pt Will Transfer to Toilet: with modified independence;ambulating Pt Will Perform Toileting - Clothing Manipulation and hygiene: with modified independence;sit to/from stand Additional ADL Goal #1: Pt will adhere to NWB on R LE during ADL and mobility modified independently.  OT Frequency: Min 2X/week   Barriers to D/C: Decreased caregiver support          Co-evaluation              AM-PAC OT "6 Clicks" Daily Activity     Outcome Measure Help from another person eating meals?: None Help from another person taking care of personal grooming?: A Little Help from another person toileting, which includes using toliet, bedpan, or urinal?: A Little Help from another person bathing (including washing, rinsing, drying)?: A Little Help from another person to put on and taking off regular upper body clothing?: None Help from another person to put on and taking off regular lower body clothing?: A Little 6 Click Score: 20   End of Session Equipment Utilized During Treatment: Gait belt;Rolling walker  Activity Tolerance: Patient tolerated treatment well Patient left: in chair;with call bell/phone within reach  OT Visit Diagnosis: Other abnormalities of gait and mobility (R26.89)                Time: 1340-1355 OT Time Calculation (min): 15 min Charges:  OT Evaluation $OT Eval Low Complexity: 1 Low  Nestor Lewandowsky, OTR/L Acute Rehabilitation Services Pager: [phone] Office: [phone]  Malka So 07/12/2019, 1:57 PM

## 2019-07-12 NOTE — Progress Notes (Addendum)
Ortho Trauma Progress Note  S: Patient seen this afternoon. Doing well, pain well controlled.  Nerve block starting to wear off some.  Has decided to pursue SNF for short-term rehab versus going home with home health therapy.  CM/SW following patient.  Patient has chosen and been accepted at [facility] in [place].    O: General: Sitting up in bed, no acute distress Respiratory: No increased work of breathing Right lower extremity: Short leg splint in place. Bruising noted over toes. Non-tender above splint.  Able to wiggle toes very small amount. Diminished sensation with light touch of dorsal/plantar aspect of foot and toes due to nerve block still being in place.  Able to fully flex and extend the knee without difficulty. Toes warm and well perfused.  Assessment: 84 year old male with right trimalleolar ankle fracture s/p ORIF right ankle 07/11/2019  Plan: - PT/OT eval today, recommending SNF. - Continue nonweightbearing right lower extremity - Repeat Covid testing ordered today, likely discharge to [facility] tomorrow morning - Continue aspirin 325 twice daily for DVT prophylaxis. - Follow up with Dr. Doreatha Martin in 2 weeks for splint and suture removal

## 2019-07-12 NOTE — Anesthesia Procedure Notes (Signed)
Anesthesia Regional Block: Popliteal block   Pre-Anesthetic Checklist: ,, timeout performed, Correct Patient, Correct Site, Correct Laterality, Correct Procedure, Correct Position, site marked, Risks and benefits discussed,  Surgical consent,  Pre-op evaluation,  At surgeon's request and post-op pain management  Laterality: Right and Lower  Prep: chloraprep       Needles:  Injection technique: Single-shot     Needle Length: 9cm  Needle Gauge: 22     Additional Needles: Arrow StimuQuik ECHO Echogenic Stimulating PNB Needle  Procedures:,,,, ultrasound used (permanent image in chart),,,,  Narrative:  Start time: 07/11/2019 8:22 AM End time: 07/11/2019 8:29 AM Injection made incrementally with aspirations every 5 mL.  Performed by: Personally  Anesthesiologist: Oleta Mouse, MD

## 2019-07-12 NOTE — Anesthesia Preprocedure Evaluation (Signed)
Anesthesia Evaluation  Patient identified by MRN, date of birth, ID band Patient awake  General Assessment Comment:Heard of hearing  Reviewed: Allergy & Precautions, NPO status , Patient's Chart, lab work & pertinent test results  History of Anesthesia Complications Negative for: history of anesthetic complications  Airway Mallampati: III  TM Distance: >3 FB Neck ROM: Full    Dental  (+) Dental Advisory Given   Pulmonary shortness of breath, COPD, former smoker,    breath sounds clear to auscultation       Cardiovascular hypertension, Pt. on medications (-) angina(-) Past MI and (-) CHF  Rhythm:Regular     Neuro/Psych TIA Neuromuscular disease CVA negative psych ROS   GI/Hepatic Neg liver ROS, GERD  ,  Endo/Other  negative endocrine ROS  Renal/GU negative Renal ROS     Musculoskeletal  (+) Arthritis ,  Right trimalleolar ankle fracture   Abdominal   Peds  Hematology negative hematology ROS (+)   Anesthesia Other Findings   Reproductive/Obstetrics                             Anesthesia Physical Anesthesia Plan  ASA: II  Anesthesia Plan: General and Regional   Post-op Pain Management:  Regional for Post-op pain   Induction: Intravenous  PONV Risk Score and Plan: 2 and Ondansetron and Dexamethasone  Airway Management Planned: LMA and Oral ETT  Additional Equipment: None  Intra-op Plan:   Post-operative Plan: Extubation in OR  Informed Consent: I have reviewed the patients History and Physical, chart, labs and discussed the procedure including the risks, benefits and alternatives for the proposed anesthesia with the patient or authorized representative who has indicated his/her understanding and acceptance.     Dental advisory given  Plan Discussed with: CRNA and Surgeon  Anesthesia Plan Comments:         Anesthesia Quick Evaluation

## 2019-07-12 NOTE — NC FL2 (Signed)
[place] LEVEL OF CARE SCREENING TOOL     IDENTIFICATION  Patient Name: Brendan Hopkins Birthdate: 1935-08-22 Sex: male Admission Date (Current Location): 07/11/2019  [facility] and [place] Number:  Herbalist and Address:  [facility] [place] [address] [place], [place] [address]      Provider Number: [id number]  Attending Physician Name and Address:  Shona Needles, MD  Relative Name and Phone Number:  Brylen Collens (son) [phone]    Current Level of Care: Hospital Recommended Level of Care: [place] Prior Approval Number:    Date Approved/Denied:   PASRR Number: [id number]  Discharge Plan: SNF    Current Diagnoses: Patient Active Problem List   Diagnosis Date Noted  . Closed right ankle fracture 07/11/2019  . Closed displaced trimalleolar fracture of right lower leg 07/09/2019  . Obese 01/10/2013  . S/P right THA, AA 01/09/2013  . Dyspnea 10/20/2012  . Preop cardiovascular exam 10/20/2012  . Bruit 10/20/2012  . HLD (hyperlipidemia) 12/02/2011  . Impaired glucose tolerance 12/02/2011  . TIA (transient ischemic attack) 12/01/2011  . H/O atrial fibrillation without current medication 12/01/2011  . HTN (hypertension) 12/01/2011  . Arthritis 12/01/2011  . Personal history of colonic polyps-adenoma and sessile serrated polyp 07/15/2010    Orientation RESPIRATION BLADDER Height & Weight     Self, Time, Situation, Place  Normal Continent Weight: 102.1 kg Height:  5\' 11"  (180.3 cm)  BEHAVIORAL SYMPTOMS/MOOD NEUROLOGICAL BOWEL NUTRITION STATUS      Continent Diet  AMBULATORY STATUS COMMUNICATION OF NEEDS Skin   Limited Assist Verbally Normal(surgical incision)                       Personal Care Assistance Level of Assistance  Bathing, Dressing, Feeding Bathing Assistance: Limited assistance Feeding assistance: Limited assistance Dressing Assistance: Limited assistance     Functional  Limitations Info             SPECIAL CARE FACTORS FREQUENCY  PT (By licensed PT), OT (By licensed OT)     PT Frequency: five times a  week OT Frequency: five times a week            Contractures Contractures Info: Not present    Additional Factors Info  Code Status, Allergies Code Status Info: full Allergies Info: No known allergies           Current Medications (07/12/2019):  This is the current hospital active medication list Current Facility-Administered Medications  Medication Dose Route Frequency Provider Last Rate Last Admin  . 0.9 %  sodium chloride infusion   Intravenous Continuous Delray Alt, PA-C 100 mL/hr at 07/11/19 2314 New Bag at 07/11/19 2314  . 0.9 % NaCl with KCl 20 mEq/ L  infusion   Intravenous Continuous Delray Alt, PA-C 50 mL/hr at 07/12/19 1002 New Bag at 07/12/19 1002  . acetaminophen (TYLENOL) tablet 1,000 mg  1,000 mg Oral Q6H Patrecia Pace A, PA-C   1,000 mg at 07/12/19 0954  . amLODipine (NORVASC) tablet 5 mg  5 mg Oral QHS Patrecia Pace A, PA-C   5 mg at 07/11/19 2120  . aspirin EC tablet 325 mg  325 mg Oral BID Delray Alt, PA-C   325 mg at 07/12/19 [id number]  . diphenhydrAMINE (BENADRYL) 12.5 MG/5ML elixir 12.5-25 mg  12.5-25 mg Oral Q4H PRN Patrecia Pace A, PA-C      . docusate sodium (COLACE) capsule 100  mg  100 mg Oral BID Patrecia Pace A, PA-C   100 mg at 07/12/19 [id number]  . gabapentin (NEURONTIN) capsule 100 mg  100 mg Oral TID Patrecia Pace A, PA-C   100 mg at 07/12/19 0954  . hydrochlorothiazide (HYDRODIURIL) tablet 25 mg  25 mg Oral Daily Patrecia Pace A, PA-C   25 mg at 07/12/19 [id number]  . HYDROmorphone (DILAUDID) injection 1 mg  1 mg Intravenous Q3H PRN Delray Alt, PA-C      . meclizine (ANTIVERT) tablet 25 mg  25 mg Oral TID PRN Delray Alt, PA-C      . methocarbamol (ROBAXIN) tablet 500 mg  500 mg Oral Q6H PRN Patrecia Pace A, PA-C       Or  . methocarbamol (ROBAXIN) 500 mg in dextrose 5 % 50 mL IVPB  500 mg Intravenous  Q6H PRN Delray Alt, PA-C      . metoCLOPramide (REGLAN) tablet 5-10 mg  5-10 mg Oral Q8H PRN Patrecia Pace A, PA-C       Or  . metoCLOPramide (REGLAN) injection 5-10 mg  5-10 mg Intravenous Q8H PRN Delray Alt, PA-C      . ondansetron (ZOFRAN) tablet 4 mg  4 mg Oral Q6H PRN Delray Alt, PA-C       Or  . ondansetron (ZOFRAN) injection 4 mg  4 mg Intravenous Q6H PRN Delray Alt, PA-C      . oxyCODONE (Oxy IR/ROXICODONE) immediate release tablet 5-10 mg  5-10 mg Oral Q4H PRN Delray Alt, PA-C      . pantoprazole (PROTONIX) EC tablet 40 mg  40 mg Oral Daily Patrecia Pace A, PA-C   40 mg at 07/12/19 0954  . Peppermint Oil CPCR 2 capsule  2 capsule Oral 5 times per day Delray Alt, PA-C   2 capsule at 07/11/19 2311  . zolpidem (AMBIEN) tablet 5 mg  5 mg Oral QHS PRN Delray Alt, PA-C         Discharge Medications: Please see discharge summary for a list of discharge medications.  Relevant Imaging Results:  Relevant Lab Results:   Additional Information SSI [id number]  Marilu Favre, RN

## 2019-07-12 NOTE — Progress Notes (Signed)
Physical Therapy Treatment Patient Details Name: Brendan Hopkins MRN: [id number] DOB: September 04, 1935 Today's Date: 07/12/2019    History of Present Illness Pt is an 84 y/o male s/p ORIF of R ankle fx. PMH includes COPD, GERD, HTN and R THA.     PT Comments    Pt overall moving very well with functional mobility and use of RW for transfers and initiation of gait training. Pt remains pain-free as he reports numbness from his R knee distally. He did not require any physical assistance this session, just min guard for safety. Pt would continue to benefit from skilled physical therapy services at this time while admitted and after d/c to address the below listed limitations in order to improve overall safety and independence with functional mobility.    Follow Up Recommendations  SNF;Other (comment)(pt wanting to d/c to SNF for short term rehab)     Equipment Recommendations  None recommended by PT    Recommendations for Other Services       Precautions / Restrictions Precautions Precautions: Fall Restrictions Weight Bearing Restrictions: Yes RLE Weight Bearing: Non weight bearing    Mobility  Bed Mobility Overal bed mobility: Needs Assistance Bed Mobility: Supine to Sit;Sit to Supine     Supine to sit: Supervision Sit to supine: Supervision   General bed mobility comments: Supervision for safety.   Transfers Overall transfer level: Needs assistance Equipment used: Rolling walker (2 wheeled) Transfers: Sit to/from Stand Sit to Stand: Min guard         General transfer comment: Min guard for safety, good technique utilized  Ambulation/Gait Ambulation/Gait assistance: Min guard   Assistive device: Rolling walker (2 wheeled)       General Gait Details: pt able to hop several times laterally at EOB, also able to hop forwards and backwards x2   Stairs             Wheelchair Mobility    Modified Rankin (Stroke Patients Only)       Balance Overall  balance assessment: Needs assistance Sitting-balance support: No upper extremity supported;Feet supported Sitting balance-Leahy Scale: Good     Standing balance support: Bilateral upper extremity supported;During functional activity Standing balance-Leahy Scale: Poor Standing balance comment: Reliant on BUE support                             Cognition Arousal/Alertness: Awake/alert Behavior During Therapy: WFL for tasks assessed/performed Overall Cognitive Status: Within Functional Limits for tasks assessed                                        Exercises General Exercises - Lower Extremity Long Arc Quad: AROM;Strengthening;Right;15 reps;Seated Hip Flexion/Marching: AROM;Strengthening;Right;15 reps;Seated;Standing;Other (comment)(pt performed in sitting and standing position)    General Comments        Pertinent Vitals/Pain Pain Assessment: No/denies pain    Home Living                      Prior Function            PT Goals (current goals can now be found in the care plan section) Acute Rehab PT Goals PT Goal Formulation: With patient Time For Goal Achievement: 07/25/19 Potential to Achieve Goals: Good Progress towards PT goals: Progressing toward goals    Frequency    Min 3X/week  PT Plan Current plan remains appropriate;Frequency needs to be updated    Co-evaluation              AM-PAC PT "6 Clicks" Mobility   Outcome Measure  Help needed turning from your back to your side while in a flat bed without using bedrails?: None Help needed moving from lying on your back to sitting on the side of a flat bed without using bedrails?: None Help needed moving to and from a bed to a chair (including a wheelchair)?: A Little Help needed standing up from a chair using your arms (e.g., wheelchair or bedside chair)?: None Help needed to walk in hospital room?: A Little Help needed climbing 3-5 steps with a railing? :  Total 6 Click Score: 19    End of Session Equipment Utilized During Treatment: Gait belt Activity Tolerance: Patient tolerated treatment well Patient left: in bed;with call bell/phone within reach;with nursing/sitter in room Nurse Communication: Mobility status PT Visit Diagnosis: Unsteadiness on feet (R26.81);History of falling (Z91.81)     Time: [id number] PT Time Calculation (min) (ACUTE ONLY): 28 min  Charges:  $Therapeutic Exercise: 8-22 mins $Therapeutic Activity: 8-22 mins                     Anastasio Champion, DPT  Acute Rehabilitation Services Pager [phone] Office [place] 07/12/2019, 11:47 AM

## 2019-07-12 NOTE — TOC Initial Note (Addendum)
Transition of Care [facility]) - Initial/Assessment Note    Patient Details  Name: Brendan Hopkins MRN: [id number] Date of Birth: [birth year]  [facility] CM/SW Contact:    Brendan Favre, RN Phone Number: 07/12/2019, 11:13 AM  Clinical Narrative:                  Patient from home alone. Has PCP.   Patient's son Brendan Hopkins lives in [place] , step daughter Brendan Hopkins lives in [place] and is a Merchandiser, retail. Patient has another son who lives in [place] who has medical issues.  [place] daughter currently looking after patient's home and three dogs.   Patient agreeable to go to SNF for short term rehab. He has never been to a SNF before. His father in law had been to [facility] twice, but patient has no preference.   Explained NCM will fax out FL2 and provide him with offers once received. NCM will start insurance authorization. NCM offered to call family, however patient declined , he stated he has already talked to family.   Will follow up once receive offers. Provided offers to patient. Patient chose [facility].  Offered to call family patient declined stating he will. Sent Brendan Hopkins a message. Will update insurance.  Spoke to [place] with [facility]. Patient will need new covid test and insurance authorization they can admit patient tomorrow if medically ready . Update insurance on patient's chose of SNF spoke with Brendan Hopkins. Insurance auth still Contractor PA for new covid test   Expected Discharge Plan: Skilled Nursing Facility Barriers to Discharge: Continued Medical Work up   Patient Goals and CMS Choice Patient states their goals for this hospitalization and ongoing recovery are:: to get stronger CMS Medicare.gov Compare Post Acute Care list provided to:: Patient Choice offered to / list presented to : Patient  Expected Discharge Plan and Services Expected Discharge Plan: [place]   Discharge Planning Services: CM Consult Post Acute Care  Choice: [place] Living arrangements for the past 2 months: Single Family Home                 DME Arranged: N/A                    Prior Living Arrangements/Services Living arrangements for the past 2 months: Single Family Home Lives with:: Self Patient language and need for interpreter reviewed:: Yes Do you feel safe going back to the place where you live?: Yes      Need for Family Participation in Patient Care: Yes (Comment) Care giver support system in place?: Yes (comment)   Criminal Activity/Legal Involvement Pertinent to Current Situation/Hospitalization: No - Comment as needed  Activities of Daily Living Home Assistive Devices/Equipment: Other (Comment) ADL Screening (condition at time of admission) Patient's cognitive ability adequate to safely complete daily activities?: Yes Is the patient deaf or have difficulty hearing?: Yes Does the patient have difficulty seeing, even when wearing glasses/contacts?: No Does the patient have difficulty concentrating, remembering, or making decisions?: No Patient able to express need for assistance with ADLs?: Yes Does the patient have difficulty dressing or bathing?: Yes Independently performs ADLs?: Yes (appropriate for developmental age) Does the patient have difficulty walking or climbing stairs?: Yes Weakness of Legs: None Weakness of Arms/Hands: None  Permission Sought/Granted   Permission granted to share information with : No              Emotional Assessment Appearance:: Appears stated age Attitude/Demeanor/Rapport:  Engaged Affect (typically observed): Accepting Orientation: : Oriented to Self, Oriented to Place, Oriented to  Time, Oriented to Situation Alcohol / Substance Use: Not Applicable Psych Involvement: No (comment)  Admission diagnosis:  Closed right ankle fracture [S82.891A] Patient Active Problem List   Diagnosis Date Noted  . Closed right ankle fracture 07/11/2019  . Closed  displaced trimalleolar fracture of right lower leg 07/09/2019  . Obese 01/10/2013  . S/P right THA, AA 01/09/2013  . Dyspnea 10/20/2012  . Preop cardiovascular exam 10/20/2012  . Bruit 10/20/2012  . HLD (hyperlipidemia) 12/02/2011  . Impaired glucose tolerance 12/02/2011  . TIA (transient ischemic attack) 12/01/2011  . H/O atrial fibrillation without current medication 12/01/2011  . HTN (hypertension) 12/01/2011  . Arthritis 12/01/2011  . Personal history of colonic polyps-adenoma and sessile serrated polyp 07/15/2010   PCP:  Brendan [name], MD Pharmacy:   [facility] ([place]), [place] - [address] [place]) [place] [address] Phone: [phone] Fax: [phone]     Social Determinants of Health (SDOH) Interventions    Readmission Risk Interventions No flowsheet data found.

## 2019-07-12 NOTE — Plan of Care (Signed)

## 2019-07-12 NOTE — Discharge Instructions (Signed)
[facility] Discharge Instructions   General Discharge Instructions  WEIGHT BEARING STATUS: Nonweightbearing right lower extremity  RANGE OF MOTION/ACTIVITY: Do not remove splint.  Okay for knee motion.  Wound Care: Do not remove splint from right lower extremity.  Okay to begin showering, keep splint covered and dry when doing so.    DVT/PE prophylaxis: Aspirin 325 mg twice daily x30 days  Diet: as you were eating previously.  Can use over the counter stool softeners and bowel preparations, such as Miralax, to help with bowel movements.  Narcotics can be constipating.  Be sure to drink plenty of fluids  PAIN MEDICATION USE AND EXPECTATIONS  You have likely been given narcotic medications to help control your pain.  After a traumatic event that results in an fracture (broken bone) with or without surgery, it is ok to use narcotic pain medications to help control one's pain.  We understand that everyone responds to pain differently and each individual patient will be evaluated on a regular basis for the continued need for narcotic medications. Ideally, narcotic medication use should last no more than 6-8 weeks (coinciding with fracture healing).   As a patient it is your responsibility as well to monitor narcotic medication use and report the amount and frequency you use these medications when you come to your office visit.   We would also advise that if you are using narcotic medications, you should take a dose prior to therapy to maximize you participation.  IF YOU ARE ON NARCOTIC MEDICATIONS IT IS NOT PERMISSIBLE TO OPERATE A MOTOR VEHICLE (MOTORCYCLE/CAR/TRUCK/MOPED) OR HEAVY MACHINERY DO NOT MIX NARCOTICS WITH OTHER CNS (CENTRAL NERVOUS SYSTEM) DEPRESSANTS SUCH AS ALCOHOL   STOP SMOKING OR USING NICOTINE PRODUCTS!!!!  As discussed nicotine severely impairs your body's ability to heal surgical and traumatic wounds but also impairs bone healing.  Wounds and bone heal by  forming microscopic blood vessels (angiogenesis) and nicotine is a vasoconstrictor (essentially, shrinks blood vessels).  Therefore, if vasoconstriction occurs to these microscopic blood vessels they essentially disappear and are unable to deliver necessary nutrients to the healing tissue.  This is one modifiable factor that you can do to dramatically increase your chances of healing your injury.    (This means no smoking, no nicotine gum, patches, etc)  DO NOT USE NONSTEROIDAL ANTI-INFLAMMATORY DRUGS (NSAID'S)  Using products such as Advil (ibuprofen), Aleve (naproxen), Motrin (ibuprofen) for additional pain control during fracture healing can delay and/or prevent the healing response.  If you would like to take over the counter (OTC) medication, Tylenol (acetaminophen) is ok.  However, some narcotic medications that are given for pain control contain acetaminophen as well. Therefore, you should not exceed more than 4000 mg of tylenol in a day if you do not have liver disease.  Also note that there are may OTC medicines, such as cold medicines and allergy medicines that my contain tylenol as well.  If you have any questions about medications and/or interactions please ask your doctor/PA or your pharmacist.      ICE AND ELEVATE INJURED/OPERATIVE EXTREMITY  Using ice and elevating the injured extremity above your heart can help with swelling and pain control.  Icing in a pulsatile fashion, such as 20 minutes on and 20 minutes off, can be followed.    Do not place ice directly on skin. Make sure there is a barrier between to skin and the ice pack.    Using frozen items such as frozen peas works well as the  conform nicely to the are that needs to be iced.  USE AN ACE WRAP OR TED HOSE FOR SWELLING CONTROL  In addition to icing and elevation, Ace wraps or TED hose are used to help limit and resolve swelling.  It is recommended to use Ace wraps or TED hose until you are informed to stop.    When using Ace  Wraps start the wrapping distally (farthest away from the body) and wrap proximally (closer to the body)   Example: If you had surgery on your leg or thing and you do not have a splint on, start the ace wrap at the toes and work your way up to the thigh        If you had surgery on your upper extremity and do not have a splint on, start the ace wrap at your fingers and work your way up to the upper arm  IF YOU ARE IN A SPLINT OR CAST DO NOT [place]   If your splint gets wet for any reason please contact the office immediately. You may shower in your splint or cast as long as you keep it dry.  This can be done by wrapping in a cast cover or garbage back (or similar)  Do Not stick any thing down your splint or cast such as pencils, money, or hangers to try and scratch yourself with.  If you feel itchy take benadryl as prescribed on the bottle for itching  IF YOU ARE IN A CAM BOOT (BLACK BOOT)  You may remove boot periodically. Perform daily dressing changes as noted below.  Wash the liner of the boot regularly and wear a sock when wearing the boot. It is recommended that you sleep in the boot until told otherwise   CALL THE OFFICE WITH ANY QUESTIONS OR CONCERNS: [phone]   VISIT OUR WEBSITE FOR ADDITIONAL INFORMATION: [email or web address]    Discharge Wound Care Instructions  Do NOT apply any ointments, solutions or lotions to pin sites or surgical wounds.  These prevent needed drainage and even though solutions like hydrogen peroxide kill bacteria, they also damage cells lining the pin sites that help fight infection.  Applying lotions or ointments can keep the wounds moist and can cause them to breakdown and open up as well. This can increase the risk for infection. When in doubt call the office.  Surgical incisions should be dressed daily.  If any drainage is noted, use one layer of adaptic, then gauze, Kerlix, and an ace wrap.  Once the incision is completely dry and  without drainage, it may be left open to air out.  Showering may begin 36-48 hours later.  Cleaning gently with soap and water.  Traumatic wounds should be dressed daily as well.    One layer of adaptic, gauze, Kerlix, then ace wrap.  The adaptic can be discontinued once the draining has ceased    If you have a wet to dry dressing: wet the gauze with saline the squeeze as much saline out so the gauze is moist (not soaking wet), place moistened gauze over wound, then place a dry gauze over the moist one, followed by Kerlix wrap, then ace wrap.

## 2019-07-13 DIAGNOSIS — S82851A Displaced trimalleolar fracture of right lower leg, initial encounter for closed fracture: Secondary | ICD-10-CM | POA: Diagnosis not present

## 2019-07-13 DIAGNOSIS — Z85828 Personal history of other malignant neoplasm of skin: Secondary | ICD-10-CM | POA: Diagnosis not present

## 2019-07-13 DIAGNOSIS — M2550 Pain in unspecified joint: Secondary | ICD-10-CM | POA: Diagnosis not present

## 2019-07-13 DIAGNOSIS — S82851E Displaced trimalleolar fracture of right lower leg, subsequent encounter for open fracture type I or II with routine healing: Secondary | ICD-10-CM | POA: Diagnosis not present

## 2019-07-13 DIAGNOSIS — Z741 Need for assistance with personal care: Secondary | ICD-10-CM | POA: Diagnosis not present

## 2019-07-13 DIAGNOSIS — R5381 Other malaise: Secondary | ICD-10-CM | POA: Diagnosis not present

## 2019-07-13 DIAGNOSIS — Z7401 Bed confinement status: Secondary | ICD-10-CM | POA: Diagnosis not present

## 2019-07-13 DIAGNOSIS — Z03818 Encounter for observation for suspected exposure to other biological agents ruled out: Secondary | ICD-10-CM | POA: Diagnosis not present

## 2019-07-13 DIAGNOSIS — Z20822 Contact with and (suspected) exposure to covid-19: Secondary | ICD-10-CM | POA: Diagnosis not present

## 2019-07-13 DIAGNOSIS — Z7982 Long term (current) use of aspirin: Secondary | ICD-10-CM | POA: Diagnosis not present

## 2019-07-13 DIAGNOSIS — S93431A Sprain of tibiofibular ligament of right ankle, initial encounter: Secondary | ICD-10-CM | POA: Diagnosis not present

## 2019-07-13 DIAGNOSIS — Z79899 Other long term (current) drug therapy: Secondary | ICD-10-CM | POA: Diagnosis not present

## 2019-07-13 DIAGNOSIS — Z8673 Personal history of transient ischemic attack (TIA), and cerebral infarction without residual deficits: Secondary | ICD-10-CM | POA: Diagnosis not present

## 2019-07-13 DIAGNOSIS — M6281 Muscle weakness (generalized): Secondary | ICD-10-CM | POA: Diagnosis not present

## 2019-07-13 DIAGNOSIS — I10 Essential (primary) hypertension: Secondary | ICD-10-CM | POA: Diagnosis not present

## 2019-07-13 DIAGNOSIS — J449 Chronic obstructive pulmonary disease, unspecified: Secondary | ICD-10-CM | POA: Diagnosis not present

## 2019-07-13 DIAGNOSIS — M1990 Unspecified osteoarthritis, unspecified site: Secondary | ICD-10-CM | POA: Diagnosis not present

## 2019-07-13 DIAGNOSIS — S82851D Displaced trimalleolar fracture of right lower leg, subsequent encounter for closed fracture with routine healing: Secondary | ICD-10-CM | POA: Diagnosis not present

## 2019-07-13 DIAGNOSIS — R2689 Other abnormalities of gait and mobility: Secondary | ICD-10-CM | POA: Diagnosis not present

## 2019-07-13 DIAGNOSIS — I679 Cerebrovascular disease, unspecified: Secondary | ICD-10-CM | POA: Diagnosis not present

## 2019-07-13 DIAGNOSIS — K219 Gastro-esophageal reflux disease without esophagitis: Secondary | ICD-10-CM | POA: Diagnosis not present

## 2019-07-13 LAB — CBC
HCT: 41.7 % (ref 39.0–52.0)
Hemoglobin: 14.3 g/dL (ref 13.0–17.0)
MCH: 33.6 pg (ref 26.0–34.0)
MCHC: 34.3 g/dL (ref 30.0–36.0)
MCV: 98.1 fL (ref 80.0–100.0)
Platelets: 206 10*3/uL (ref 150–400)
RBC: 4.25 MIL/uL (ref 4.22–5.81)
RDW: 13.3 % (ref 11.5–15.5)
WBC: 8.7 10*3/uL (ref 4.0–10.5)
nRBC: 0 % (ref 0.0–0.2)

## 2019-07-13 LAB — BASIC METABOLIC PANEL
Anion gap: 11 (ref 5–15)
BUN: 17 mg/dL (ref 8–23)
CO2: 23 mmol/L (ref 22–32)
Calcium: 8.8 mg/dL — ABNORMAL LOW (ref 8.9–10.3)
Chloride: 103 mmol/L (ref 98–111)
Creatinine, Ser: 1.05 mg/dL (ref 0.61–1.24)
GFR calc Af Amer: >60 mL/min (ref >60)
GFR calc non Af Amer: >60 mL/min (ref >60)
Glucose, Bld: 101 mg/dL — ABNORMAL HIGH (ref 70–99)
Potassium: 4.1 mmol/L (ref 3.5–5.1)
Sodium: 137 mmol/L (ref 135–145)

## 2019-07-13 MED ORDER — VITAMIN D3 25 MCG PO TABS: 2000.0000 [IU] | ORAL_TABLET | Freq: Every day | ORAL | 0 refills | Status: AC

## 2019-07-13 MED ORDER — HYDROCODONE-ACETAMINOPHEN 5-325 MG PO TABS: 1.0000 | ORAL_TABLET | Freq: Four times a day (QID) | ORAL | 0 refills | Status: DC | PRN

## 2019-07-13 MED ORDER — ASPIRIN 325 MG PO TBEC: 325.0000 mg | DELAYED_RELEASE_TABLET | Freq: Two times a day (BID) | ORAL | 0 refills | Status: AC

## 2019-07-13 NOTE — Progress Notes (Signed)
Patient discharged to [place].  Transported via [facility] with discharge instruction packet.

## 2019-07-13 NOTE — Plan of Care (Signed)

## 2019-07-13 NOTE — TOC Transition Note (Addendum)
Transition of Care [facility]) - CM/SW Discharge Note   Patient Details  Name: Brendan Hopkins MRN: [id number] Date of Birth: 08-31-1935  [facility] of [facility]) CM/SW Contact:  Marilu Favre, RN Phone Number: 07/13/2019, 7:29 AM   Clinical Narrative:     Have received insurance authorization for SNF. Auth number [id number], ref [id number]. Patient has chosen [facility] [place]. Tammy with [facility] asking if patient can be there by 1 pm today so PT can eval same day.  PA working on discharge summary now. Will update Tammy. Will place PTAR papers in shadow chart.   Nurse will call report. [facility] arranged for noon.  Final next level of care: Skilled Nursing Facility Barriers to Discharge: Continued Medical Work up   Patient Goals and CMS Choice Patient states their goals for this hospitalization and ongoing recovery are:: to get stronger CMS Medicare.gov Compare Post Acute Care list provided to:: Patient Choice offered to / list presented to : Patient  Discharge Placement                       Discharge Plan and Services   Discharge Planning Services: CM Consult Post Acute Care Choice: [place]          DME Arranged: N/A                    Social Determinants of Health (SDOH) Interventions     Readmission Risk Interventions No flowsheet data found.

## 2019-07-13 NOTE — Discharge Summary (Signed)
Orthopaedic Trauma Service (OTS) Discharge Summary   Patient ID: Brendan Hopkins MRN: [id number] DOB/AGE: 1935-12-17 84 y.o.  Admit date: 07/11/2019 Discharge date: 07/13/2019  Admission Diagnoses: Right trimalleolar ankle fracture   Discharge Diagnoses:  Principal Problem:   Closed displaced trimalleolar fracture of right lower leg Active Problems:   Closed right ankle fracture   Past Medical History:  Diagnosis Date  . Arthritis    RIGHT HIP, HANDS, BACK  . Cancer [facility])    Skin cancer   . Cataract   . Cervical radiculopathy    severe C3-4 neuroforaminal stenosis, C5-6 left synovial cyst compressing left nerve root.  . Colon polyps    2005  . COPD (chronic obstructive pulmonary disease) ([place])    SMOKED FOR 89 YRS  . Diverticulosis    2005  . Fatty liver   . GERD (gastroesophageal reflux disease)   . Hypertension   . Pain    RIGHT HIP AND BACK  . Personal history of colonic polyps-adenoma and sessile serrated polyp 07/15/2010  . RBBB    POSS HX AF IN PAST- PAC'S  . Shortness of breath    WITH EXERTION ONLY  . Stroke ([place])    FEW YEARS AGO - PT SAYS "HEAT STROKE" X 2 - BUT NO PHYSICAL DEFICITS  . TIA (transient ischemic attack)      Procedures Performed: 1. CPT 27822-Open reduction internal fixation of right trimalleolar ankle fracture 2. CPT 27829-Open reduction internal fixation of right syndesmotic disruption  Discharged Condition: good  Hospital Course: Patient presented to [facility] on 07/11/2019 for scheduled surgical fixation of right ankle.  Was taken to the operating room by Dr. Lennette Bihari Haddix for the above procedure.  He tolerated this well without complications.  Was placed in a short leg splint postoperatively with instructions to be nonweightbearing.  Was admitted overnight for pain control and observation.  Began working with physical and occupational therapy starting on postoperative day #1.  Was started on aspirin 325 mg twice daily for DVT  prophylaxis starting on postoperative day #1.  Due to lack of support at home patient decided to pursue SNF placement for acute rehab.  Case management/social work was consulted to begin this process on postop day #1.  The remainder of the patient's hospitalization was dedicated to increasing mobility. On 07/13/2019, the patient was tolerating diet, working well with therapies, pain well controlled, vital signs stable, dressings clean, dry, intact and felt stable for discharge to  SNF ([place]). Patient will follow up as below and knows to call with questions or concerns.     Consults: None  Significant Diagnostic Studies:   Results for orders placed or performed during the hospital encounter of 07/11/19 (from the past 168 hour(s))  VITAMIN D 25 Hydroxy (Vit-D Deficiency, Fractures)   Collection Time: 07/11/19 11:31 AM  Result Value Ref Range   Vit D, 25-Hydroxy 35.64 30 - 100 ng/mL  CBC   Collection Time: 07/11/19 11:31 AM  Result Value Ref Range   WBC 6.7 4.0 - 10.5 K/uL   RBC 4.59 4.22 - 5.81 MIL/uL   Hemoglobin 15.6 13.0 - 17.0 g/dL   HCT 45.7 39.0 - 52.0 %   MCV 99.6 80.0 - 100.0 fL   MCH 34.0 26.0 - 34.0 pg   MCHC 34.1 30.0 - 36.0 g/dL   RDW 13.2 11.5 - 15.5 %   Platelets 209 150 - 400 K/uL   nRBC 0.0 0.0 - 0.2 %  Creatinine, serum  Collection Time: 07/11/19 11:31 AM  Result Value Ref Range   Creatinine, Ser 1.01 0.61 - 1.24 mg/dL   GFR calc non Af Amer >60 >60 mL/min   GFR calc Af Amer >60 >60 mL/min  Basic metabolic panel   Collection Time: 07/12/19  1:22 AM  Result Value Ref Range   Sodium 138 135 - 145 mmol/L   Potassium 3.9 3.5 - 5.1 mmol/L   Chloride 104 98 - 111 mmol/L   CO2 23 22 - 32 mmol/L   Glucose, Bld 124 (H) 70 - 99 mg/dL   BUN 20 8 - 23 mg/dL   Creatinine, Ser 0.99 0.61 - 1.24 mg/dL   Calcium 8.9 8.9 - 10.3 mg/dL   GFR calc non Af Amer >60 >60 mL/min   GFR calc Af Amer >60 >60 mL/min   Anion gap 11 5 - 15  CBC   Collection Time:  07/12/19  1:22 AM  Result Value Ref Range   WBC 10.2 4.0 - 10.5 K/uL   RBC 4.19 (L) 4.22 - 5.81 MIL/uL   Hemoglobin 14.0 13.0 - 17.0 g/dL   HCT 41.4 39.0 - 52.0 %   MCV 98.8 80.0 - 100.0 fL   MCH 33.4 26.0 - 34.0 pg   MCHC 33.8 30.0 - 36.0 g/dL   RDW 13.0 11.5 - 15.5 %   Platelets 210 150 - 400 K/uL   nRBC 0.0 0.0 - 0.2 %  SARS CORONAVIRUS 2 (TAT 6-24 HRS) Nasopharyngeal Nasopharyngeal Swab   Collection Time: 07/12/19  1:53 PM   Specimen: Nasopharyngeal Swab  Result Value Ref Range   SARS Coronavirus 2 NEGATIVE NEGATIVE  Basic metabolic panel   Collection Time: 07/13/19  4:09 AM  Result Value Ref Range   Sodium 137 135 - 145 mmol/L   Potassium 4.1 3.5 - 5.1 mmol/L   Chloride 103 98 - 111 mmol/L   CO2 23 22 - 32 mmol/L   Glucose, Bld 101 (H) 70 - 99 mg/dL   BUN 17 8 - 23 mg/dL   Creatinine, Ser 1.05 0.61 - 1.24 mg/dL   Calcium 8.8 (L) 8.9 - 10.3 mg/dL   GFR calc non Af Amer >60 >60 mL/min   GFR calc Af Amer >60 >60 mL/min   Anion gap 11 5 - 15  CBC   Collection Time: 07/13/19  4:09 AM  Result Value Ref Range   WBC 8.7 4.0 - 10.5 K/uL   RBC 4.25 4.22 - 5.81 MIL/uL   Hemoglobin 14.3 13.0 - 17.0 g/dL   HCT 41.7 39.0 - 52.0 %   MCV 98.1 80.0 - 100.0 fL   MCH 33.6 26.0 - 34.0 pg   MCHC 34.3 30.0 - 36.0 g/dL   RDW 13.3 11.5 - 15.5 %   Platelets 206 150 - 400 K/uL   nRBC 0.0 0.0 - 0.2 %  Results for orders placed or performed during the hospital encounter of 07/09/19 (from the past 168 hour(s))  SARS CORONAVIRUS 2 (TAT 6-24 HRS) Nasopharyngeal Nasopharyngeal Swab   Collection Time: 07/09/19  1:20 PM   Specimen: Nasopharyngeal Swab  Result Value Ref Range   SARS Coronavirus 2 NEGATIVE NEGATIVE     Treatments: surgery: 1. CPT 27822-Open reduction internal fixation of right trimalleolar ankle fracture 2. CPT 27829-Open reduction internal fixation of right syndesmotic disruption  Discharge Exam: General: Sitting up in bed, no acute distress Respiratory: No increased work  of breathing at rest Right lower extremity: Short leg splint in place.  Bruising noted over  the toes.  Nontender above the splint.  Able to fully flex and extend his knee.  Able to wiggle his toes.  Sensation is intact to light touch over the dorsum and plantar aspect of the forefoot and toes.  Toes are warm and well-perfused.  Disposition: Discharge disposition: 03-Skilled Nursing Facility       Allergies as of 07/13/2019   No Known Allergies     Medication List    STOP taking these medications   benzonatate 100 MG capsule Commonly known as: TESSALON   HYDROcodone-homatropine 5-1.5 MG/5ML syrup Commonly known as: HYCODAN   ibuprofen 600 MG tablet Commonly known as: ADVIL   ondansetron 4 MG disintegrating tablet Commonly known as: Zofran ODT   oxyCODONE-acetaminophen 5-325 MG tablet Commonly known as: PERCOCET/ROXICET   terbinafine 250 MG tablet Commonly known as: LamISIL     TAKE these medications   acetaminophen 650 MG CR tablet Commonly known as: TYLENOL Take 650 mg by mouth every 8 (eight) hours as needed for pain.   amLODipine 5 MG tablet Commonly known as: NORVASC Take 1 tablet (5 mg total) by mouth daily. What changed: when to take this   aspirin 325 MG EC tablet Take 1 tablet (325 mg total) by mouth in the morning and at bedtime. What changed:   medication strength  how much to take  when to take this   fish oil-omega-3 fatty acids 1000 MG capsule Take 1 g by mouth daily.   hydrochlorothiazide 25 MG tablet Commonly known as: HYDRODIURIL Take 1 tablet (25 mg total) by mouth daily.   HYDROcodone-acetaminophen 5-325 MG tablet Commonly known as: NORCO/VICODIN Take 1 tablet by mouth every 6 (six) hours as needed for severe pain.   IBGARD PO Take 2 capsules by mouth See admin instructions. Take 2 capsules in the morning, at lunch, late after noon and bed time and in middle of the night for a total of 10 tablets   meclizine 25 MG tablet Commonly  known as: ANTIVERT TAKE ONE TABLET BY MOUTH THREE TIMES DAILY AS NEEDED FOR DIZZINESS What changed:   how much to take  how to take this  when to take this  reasons to take this   multivitamin with minerals Tabs tablet Take 1 tablet by mouth daily.   pantoprazole 40 MG tablet Commonly known as: PROTONIX Take 1 tablet (40 mg total) by mouth daily.   Vitamin D3 25 MCG tablet Commonly known as: Vitamin D Take 2 tablets (2,000 Units total) by mouth daily.   zolpidem 10 MG tablet Commonly known as: AMBIEN Take 1 tablet (10 mg total) by mouth at bedtime as needed for sleep. What changed: how much to take       Contact information for follow-up providers    Haddix, Thomasene Lot, MD. Schedule an appointment as soon as possible for a visit in 2 week(s).   Specialty: Orthopedic Surgery Why: For splint and suture removal, repeat x-rays right ankle Contact information: [place] [address] [phone]            Contact information for after-discharge care    Destination    [facility] .   Service: Skilled Nursing Contact information: [place] [phone]                  Discharge Instructions and Plan: Patient will be discharged to SNF for acute rehab. Will be discharged on Aspirin 325 mg twice daily x  30 days for DVT prophylaxis. Will remain non-weightbearing on right lower extremity x 6 weeks. Patient will follow up with Dr. Doreatha Martin in 2 weeks for repeat x-rays, suture removal, splint removal with transition to CAM boot.   Signed:  Leary Roca. Carmie Kanner ?[phone] (phone) 07/13/2019, 8:47 AM  [facility] [place] [id number] [phone] (F)

## 2019-07-13 NOTE — Progress Notes (Signed)
Report given to [facility], nurse.

## 2019-07-16 ENCOUNTER — Encounter: Payer: Self-pay | Admitting: Student

## 2019-07-16 ENCOUNTER — Encounter: Payer: Self-pay | Admitting: *Deleted

## 2019-07-16 DIAGNOSIS — M1990 Unspecified osteoarthritis, unspecified site: Secondary | ICD-10-CM | POA: Diagnosis not present

## 2019-07-16 DIAGNOSIS — I10 Essential (primary) hypertension: Secondary | ICD-10-CM | POA: Diagnosis not present

## 2019-07-16 DIAGNOSIS — S93431A Sprain of tibiofibular ligament of right ankle, initial encounter: Secondary | ICD-10-CM | POA: Insufficient documentation

## 2019-07-16 DIAGNOSIS — R5381 Other malaise: Secondary | ICD-10-CM | POA: Diagnosis not present

## 2019-07-16 DIAGNOSIS — S82851D Displaced trimalleolar fracture of right lower leg, subsequent encounter for closed fracture with routine healing: Secondary | ICD-10-CM | POA: Diagnosis not present

## 2019-07-17 DIAGNOSIS — M1990 Unspecified osteoarthritis, unspecified site: Secondary | ICD-10-CM | POA: Diagnosis not present

## 2019-07-17 DIAGNOSIS — I679 Cerebrovascular disease, unspecified: Secondary | ICD-10-CM | POA: Diagnosis not present

## 2019-07-17 DIAGNOSIS — Z03818 Encounter for observation for suspected exposure to other biological agents ruled out: Secondary | ICD-10-CM | POA: Diagnosis not present

## 2019-07-17 DIAGNOSIS — S82851D Displaced trimalleolar fracture of right lower leg, subsequent encounter for closed fracture with routine healing: Secondary | ICD-10-CM | POA: Diagnosis not present

## 2019-07-17 DIAGNOSIS — I10 Essential (primary) hypertension: Secondary | ICD-10-CM | POA: Diagnosis not present

## 2019-07-23 DIAGNOSIS — R5381 Other malaise: Secondary | ICD-10-CM | POA: Diagnosis not present

## 2019-07-23 DIAGNOSIS — S82851D Displaced trimalleolar fracture of right lower leg, subsequent encounter for closed fracture with routine healing: Secondary | ICD-10-CM | POA: Diagnosis not present

## 2019-07-23 DIAGNOSIS — I10 Essential (primary) hypertension: Secondary | ICD-10-CM | POA: Diagnosis not present

## 2019-07-23 DIAGNOSIS — M1990 Unspecified osteoarthritis, unspecified site: Secondary | ICD-10-CM | POA: Diagnosis not present

## 2019-07-24 DIAGNOSIS — S82851D Displaced trimalleolar fracture of right lower leg, subsequent encounter for closed fracture with routine healing: Secondary | ICD-10-CM | POA: Diagnosis not present

## 2019-07-27 DIAGNOSIS — R5381 Other malaise: Secondary | ICD-10-CM | POA: Diagnosis not present

## 2019-07-27 DIAGNOSIS — I10 Essential (primary) hypertension: Secondary | ICD-10-CM | POA: Diagnosis not present

## 2019-07-27 DIAGNOSIS — I679 Cerebrovascular disease, unspecified: Secondary | ICD-10-CM | POA: Diagnosis not present

## 2019-07-27 DIAGNOSIS — M1990 Unspecified osteoarthritis, unspecified site: Secondary | ICD-10-CM | POA: Diagnosis not present

## 2019-08-02 ENCOUNTER — Other Ambulatory Visit: Payer: Self-pay

## 2019-08-02 ENCOUNTER — Ambulatory Visit (INDEPENDENT_AMBULATORY_CARE_PROVIDER_SITE_OTHER): Payer: Medicare Other | Admitting: Family Medicine

## 2019-08-02 ENCOUNTER — Encounter: Payer: Self-pay | Admitting: Family Medicine

## 2019-08-02 VITALS — BP 118/70 | HR 72 | Temp 98.3°F | Resp 14 | Ht 71.0 in | Wt 232.0 lb

## 2019-08-02 DIAGNOSIS — M7989 Other specified soft tissue disorders: Secondary | ICD-10-CM | POA: Diagnosis not present

## 2019-08-02 NOTE — Progress Notes (Signed)
Subjective:    Patient ID: Brendan Hopkins, male    DOB: 01/03/1936, 84 y.o.   MRN: [id number]  HPI  Recently admitted to hospital with ankle fracture.  I copied relevant portions of the discharge summary below:  Admit date: 07/11/2019 Discharge date: 07/13/2019  Admission Diagnoses: Right trimalleolar ankle fracture   Discharge Diagnoses:  Principal Problem:   Closed displaced trimalleolar fracture of right lower leg Active Problems:   Closed right ankle fracture   Procedures Performed: 1. CPT 27822-Open reduction internal fixation of right trimalleolar ankle fracture 2. CPT 27829-Open reduction internal fixation of right syndesmotic disruption  Discharged Condition: good  Hospital Course: Patient presented to [facility] on 07/11/2019 for scheduled surgical fixation of right ankle.  Was taken to the operating room by Dr. Lennette Bihari Haddix for the above procedure.  He tolerated this well without complications.  Was placed in a short leg splint postoperatively with instructions to be nonweightbearing.  Was admitted overnight for pain control and observation.  Began working with physical and occupational therapy starting on postoperative day #1.  Was started on aspirin 325 mg twice daily for DVT prophylaxis starting on postoperative day #1.  Due to lack of support at home patient decided to pursue SNF placement for acute rehab.  Case management/social work was consulted to begin this process on postop day #1.  The remainder of the patient's hospitalization was dedicated to increasing mobility. On 07/13/2019, the patient was tolerating diet,working well with therapies, pain well controlled, vital signs stable,dressings clean, dry, intactand felt stable for [facility] SNF ([facility]). Patient will follow up as below and knows to call with questions or concerns.  Patient has substantial swelling in his right leg distal to the knee.  He has +2 pitting edema in his right  leg and +1 pitting edema in his left leg.  Calf circumference in his right leg is 18 inches.  Calf circumference of his left leg is 17 inches.  He has some pain in his right leg as well would expect due to the fracture.  He has been immobile and has had to remove his cam walker due to pain in his leg.  He is not putting any weight on his leg.  He denies any chest pain shortness of breath or dyspnea on exertion.  He is not requiring any pain medication other than Tylenol.  He is now only taking a baby aspirin and is not on any other anticoagulant.  However at the skilled nursing facility due to Covid restrictions he was basically confined to his room lying in bed as he was unable to leave the room.  Therefore he has been extremely immobile. Past Medical History:  Diagnosis Date  . Arthritis    RIGHT HIP, HANDS, BACK  . Cancer [facility])    Skin cancer   . Cataract   . Cervical radiculopathy    severe C3-4 neuroforaminal stenosis, C5-6 left synovial cyst compressing left nerve root.  . Colon polyps    2005  . COPD (chronic obstructive pulmonary disease) ([place])    SMOKED FOR 72 YRS  . Diverticulosis    2005  . Fatty liver   . GERD (gastroesophageal reflux disease)   . Hypertension   . Pain    RIGHT HIP AND BACK  . Personal history of colonic polyps-adenoma and sessile serrated polyp 07/15/2010  . RBBB    POSS HX AF IN PAST- PAC'S  . Shortness of breath    WITH EXERTION ONLY  .  Stroke ([place])    FEW YEARS AGO - PT SAYS "HEAT STROKE" X 2 - BUT NO PHYSICAL DEFICITS  . TIA (transient ischemic attack)    Past Surgical History:  Procedure Laterality Date  . COLONOSCOPY     maybe 3   . COLONOSCOPY W/ POLYPECTOMY    . ORIF ANKLE FRACTURE Right 07/11/2019  . ORIF ANKLE FRACTURE Right 07/11/2019   Procedure: OPEN REDUCTION INTERNAL FIXATION (ORIF) ANKLE FRACTURE;  Surgeon: Shona Needles, MD;  Location: [facility];  Service: Orthopedics;  Laterality: Right;  . TOTAL HIP ARTHROPLASTY Right 01/09/2013    Procedure: RIGHT TOTAL HIP ARTHROPLASTY ANTERIOR APPROACH;  Surgeon: [name], MD;  Location: [facility];  Service: Orthopedics;  Laterality: Right;   Current Outpatient Medications on File Prior to Visit  Medication Sig Dispense Refill  . acetaminophen (TYLENOL) 650 MG CR tablet Take 650 mg by mouth every 8 (eight) hours as needed for pain.    Marland Kitchen amLODipine (NORVASC) 5 MG tablet Take 1 tablet (5 mg total) by mouth daily. (Patient taking differently: Take 5 mg by mouth at bedtime. ) 90 tablet 3  . aspirin 325 MG EC tablet Take 1 tablet (325 mg total) by mouth in the morning and at bedtime. 60 tablet 0  . cholecalciferol (VITAMIN D) 25 MCG tablet Take 2 tablets (2,000 Units total) by mouth daily. 180 tablet 0  . fish oil-omega-3 fatty acids 1000 MG capsule Take 1 g by mouth daily.    . hydrochlorothiazide (HYDRODIURIL) 25 MG tablet Take 1 tablet (25 mg total) by mouth daily. 90 tablet 3  . HYDROcodone-acetaminophen (NORCO/VICODIN) 5-325 MG tablet Take 1 tablet by mouth every 6 (six) hours as needed for severe pain. 28 tablet 0  . meclizine (ANTIVERT) 25 MG tablet TAKE ONE TABLET BY MOUTH THREE TIMES DAILY AS NEEDED FOR DIZZINESS (Patient taking differently: Take 25 mg by mouth 3 (three) times daily as needed for dizziness. TAKE ONE TABLET BY MOUTH THREE TIMES DAILY AS NEEDED FOR DIZZINESS) 30 tablet 2  . Multiple Vitamin (MULTIVITAMIN WITH MINERALS) TABS Take 1 tablet by mouth daily.    . pantoprazole (PROTONIX) 40 MG tablet Take 1 tablet (40 mg total) by mouth daily. 90 tablet 3  . Peppermint Oil (IBGARD PO) Take 2 capsules by mouth See admin instructions. Take 2 capsules in the morning, at lunch, late after noon and bed time and in middle of the night for a total of 10 tablets    . zolpidem (AMBIEN) 10 MG tablet Take 1 tablet (10 mg total) by mouth at bedtime as needed for sleep. (Patient taking differently: Take 5 mg by mouth at bedtime as needed for sleep. ) 15 tablet 1   No current  facility-administered medications on file prior to visit.   No Known Allergies Social History   Socioeconomic History  . Marital status: Married    Spouse name: Not on file  . Number of children: Not on file  . Years of education: Not on file  . Highest education level: Not on file  Occupational History  . Occupation: Retired  Tobacco Use  . Smoking status: Former Smoker    Years: 57.00    Types: Cigarettes    Quit date: 05/25/2007    Years since quitting: 12.1  . Smokeless tobacco: Never Used  Substance and Sexual Activity  . Alcohol use: No    Alcohol/week: 0.0 standard drinks  . Drug use: No  . Sexual activity: Not on file  Other  Topics Concern  . 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 Difficulty of Paying Living Expenses:   Food Insecurity:   . Worried About Charity fundraiser in the Last Year:   . Arboriculturist in the Last Year:   Transportation Needs:   . Film/video editor (Medical):   Marland Kitchen Lack of Transportation (Non-Medical):   Physical Activity:   . Days of Exercise per Week:   . Minutes of Exercise per Session:   Stress:   . Feeling of Stress :   Social Connections:   . Frequency of Communication with Friends and Family:   . Frequency of Social Gatherings with Friends and Family:   . Attends Religious Services:   . Active Member of Clubs or Organizations:   . Attends Archivist Meetings:   Marland Kitchen Marital Status:   Intimate Partner Violence:   . Fear of Current or Ex-Partner:   . Emotionally Abused:   Marland Kitchen Physically Abused:   . Sexually Abused:     Review of Systems  All other systems reviewed and are negative.      Objective:   Physical Exam Vitals reviewed.  Constitutional:      General: He is not in acute distress.    Appearance: Normal appearance. He is normal weight. He is not ill-appearing or  toxic-appearing.  Cardiovascular:     Rate and Rhythm: Normal rate and regular rhythm.     Heart sounds: Normal heart sounds.  Pulmonary:     Effort: Pulmonary effort is normal. No respiratory distress.     Breath sounds: Normal breath sounds. No stridor. No wheezing, rhonchi or rales.  Chest:     Chest wall: No tenderness.  Musculoskeletal:     Right lower leg: Edema present.     Left lower leg: Edema present.  Skin:    Findings: No erythema or rash.  Neurological:     Mental Status: He is alert.           Assessment & Plan:  Right leg swelling - Plan: CBC with Differential/Platelet, COMPLETE METABOLIC PANEL WITH GFR, US Venous Img Lower Unilateral Right  Given the tremendous amount of swelling in his right leg I am going to have the patient evaluated for a DVT.  Otherwise he seems to be doing well.  I encouraged the patient to use a cam walker as a precaution when he is ambulatory even though he is nonweightbearing.  I am concerned that if he awkwardly put his weight on his ankle by accident he could reinjure the ankle and require further surgery.

## 2019-08-03 ENCOUNTER — Telehealth: Payer: Self-pay | Admitting: *Deleted

## 2019-08-03 ENCOUNTER — Ambulatory Visit (INDEPENDENT_AMBULATORY_CARE_PROVIDER_SITE_OTHER): Payer: Medicare Other | Admitting: Family Medicine

## 2019-08-03 ENCOUNTER — Encounter: Payer: Self-pay | Admitting: Family Medicine

## 2019-08-03 ENCOUNTER — Ambulatory Visit: Payer: Medicare Other | Admitting: Family Medicine

## 2019-08-03 ENCOUNTER — Ambulatory Visit (HOSPITAL_COMMUNITY)
Admission: RE | Admit: 2019-08-03 | Discharge: 2019-08-03 | Disposition: A | Discharge status: Home / Self Care | Payer: Medicare Other | Source: Ambulatory Visit | Attending: Family Medicine | Admitting: Family Medicine

## 2019-08-03 VITALS — BP 130/72 | HR 69 | Temp 97.6°F | Resp 18 | Ht 71.0 in | Wt 232.0 lb

## 2019-08-03 DIAGNOSIS — I82441 Acute embolism and thrombosis of right tibial vein: Secondary | ICD-10-CM | POA: Diagnosis not present

## 2019-08-03 DIAGNOSIS — I82401 Acute embolism and thrombosis of unspecified deep veins of right lower extremity: Secondary | ICD-10-CM

## 2019-08-03 DIAGNOSIS — M7989 Other specified soft tissue disorders: Secondary | ICD-10-CM | POA: Diagnosis not present

## 2019-08-03 LAB — COMPLETE METABOLIC PANEL WITH GFR
AG Ratio: 1.6 (calc) (ref 1.0–2.5)
ALT: 13 U/L (ref 9–46)
AST: 18 U/L (ref 10–35)
Albumin: 4.1 g/dL (ref 3.6–5.1)
Alkaline phosphatase (APISO): 68 U/L (ref 35–144)
BUN: 24 mg/dL (ref 7–25)
CO2: 26 mmol/L (ref 20–32)
Calcium: 9.4 mg/dL (ref 8.6–10.3)
Chloride: 104 mmol/L (ref 98–110)
Creat: 1.06 mg/dL (ref 0.70–1.11)
GFR, Est African American: 76 mL/min/{1.73_m2} (ref >=60)
GFR, Est Non African American: 65 mL/min/{1.73_m2} (ref >=60)
Globulin: 2.6 g/dL (calc) (ref 1.9–3.7)
Glucose, Bld: 113 mg/dL — ABNORMAL HIGH (ref 65–99)
Potassium: 4.2 mmol/L (ref 3.5–5.3)
Sodium: 140 mmol/L (ref 135–146)
Total Bilirubin: 0.5 mg/dL (ref 0.2–1.2)
Total Protein: 6.7 g/dL (ref 6.1–8.1)

## 2019-08-03 LAB — CBC WITH DIFFERENTIAL/PLATELET
Absolute Monocytes: 540 cells/uL (ref 200–950)
Basophils Absolute: 50 cells/uL (ref 0–200)
Basophils Relative: 0.7 %
Eosinophils Absolute: 259 cells/uL (ref 15–500)
Eosinophils Relative: 3.6 %
HCT: 45.3 % (ref 38.5–50.0)
Hemoglobin: 15.7 g/dL (ref 13.2–17.1)
Lymphs Abs: 1901 cells/uL (ref 850–3900)
MCH: 33.6 pg — ABNORMAL HIGH (ref 27.0–33.0)
MCHC: 34.7 g/dL (ref 32.0–36.0)
MCV: 97 fL (ref 80.0–100.0)
MPV: 10.4 fL (ref 7.5–12.5)
Monocytes Relative: 7.5 %
Neutro Abs: 4450 cells/uL (ref 1500–7800)
Neutrophils Relative %: 61.8 %
Platelets: 216 10*3/uL (ref 140–400)
RBC: 4.67 10*6/uL (ref 4.20–5.80)
RDW: 12.4 % (ref 11.0–15.0)
Total Lymphocyte: 26.4 %
WBC: 7.2 10*3/uL (ref 3.8–10.8)

## 2019-08-03 MED ORDER — RIVAROXABAN (XARELTO) VTE STARTER PACK (15 & 20 MG): ORAL_TABLET | ORAL | 0 refills | Status: DC

## 2019-08-03 NOTE — Progress Notes (Signed)
Right lower extremity venous duplex exam completed.  Preliminary results given to [facility], [place]. Patient instructed to go directly to Dr. Samella Parr office.  Preliminary results can be found under CV proc under chart review.  08/03/2019 9:19 AM  Jerica Creegan, K., RDMS, RVT

## 2019-08-03 NOTE — Progress Notes (Signed)
Subjective:    Patient ID: Brendan Hopkins, male    DOB: September 08, 1935, 84 y.o.   MRN: [id number]  HPI 08/02/19 Recently admitted to hospital with ankle fracture.  I copied relevant portions of the discharge summary below:  Admit date: 07/11/2019 Discharge date: 07/13/2019  Admission Diagnoses: Right trimalleolar ankle fracture   Discharge Diagnoses:  Principal Problem:   Closed displaced trimalleolar fracture of right lower leg Active Problems:   Closed right ankle fracture   Procedures Performed: 1. CPT 27822-Open reduction internal fixation of right trimalleolar ankle fracture 2. CPT 27829-Open reduction internal fixation of right syndesmotic disruption  Discharged Condition: good  Hospital Course: Patient presented to [facility] on 07/11/2019 for scheduled surgical fixation of right ankle.  Was taken to the operating room by Dr. Lennette Bihari Haddix for the above procedure.  He tolerated this well without complications.  Was placed in a short leg splint postoperatively with instructions to be nonweightbearing.  Was admitted overnight for pain control and observation.  Began working with physical and occupational therapy starting on postoperative day #1.  Was started on aspirin 325 mg twice daily for DVT prophylaxis starting on postoperative day #1.  Due to lack of support at home patient decided to pursue SNF placement for acute rehab.  Case management/social work was consulted to begin this process on postop day #1.  The remainder of the patient's hospitalization was dedicated to increasing mobility. On 07/13/2019, the patient was tolerating diet,working well with therapies, pain well controlled, vital signs stable,dressings clean, dry, intactand felt stable for [facility] SNF ([facility]). Patient will follow up as below and knows to call with questions or concerns.  Patient has substantial swelling in his right leg distal to the knee.  He has +2 pitting edema in his  right leg and +1 pitting edema in his left leg.  Calf circumference in his right leg is 18 inches.  Calf circumference of his left leg is 17 inches.  He has some pain in his right leg as well would expect due to the fracture.  He has been immobile and has had to remove his cam walker due to pain in his leg.  He is not putting any weight on his leg.  He denies any chest pain shortness of breath or dyspnea on exertion.  He is not requiring any pain medication other than Tylenol.  He is now only taking a baby aspirin and is not on any other anticoagulant.  However at the skilled nursing facility due to Covid restrictions he was basically confined to his room lying in bed as he was unable to leave the room.  Therefore he has been extremely immobile.  At that time, my plan was: Given the tremendous amount of swelling in his right leg I am going to have the patient evaluated for a DVT.  Otherwise he seems to be doing well.  I encouraged the patient to use a cam walker as a precaution when he is ambulatory even though he is nonweightbearing.  I am concerned that if he awkwardly put his weight on his ankle by accident he could reinjure the ankle and require further surgery.  08/03/19 US revealed right posterior tibial DVT.  Here to discuss  Past Medical History:  Diagnosis Date  . Arthritis    RIGHT HIP, HANDS, BACK  . Cancer [facility])    Skin cancer   . Cataract   . Cervical radiculopathy    severe C3-4 neuroforaminal stenosis, C5-6 left synovial  cyst compressing left nerve root.  . Colon polyps    2005  . COPD (chronic obstructive pulmonary disease) ([place])    SMOKED FOR 91 YRS  . Diverticulosis    2005  . Fatty liver   . GERD (gastroesophageal reflux disease)   . Hypertension   . Pain    RIGHT HIP AND BACK  . Personal history of colonic polyps-adenoma and sessile serrated polyp 07/15/2010  . RBBB    POSS HX AF IN PAST- PAC'S  . Shortness of breath    WITH EXERTION ONLY  . Stroke ([place])    FEW YEARS  AGO - PT SAYS "HEAT STROKE" X 2 - BUT NO PHYSICAL DEFICITS  . TIA (transient ischemic attack)    Past Surgical History:  Procedure Laterality Date  . COLONOSCOPY     maybe 3   . COLONOSCOPY W/ POLYPECTOMY    . ORIF ANKLE FRACTURE Right 07/11/2019  . ORIF ANKLE FRACTURE Right 07/11/2019   Procedure: OPEN REDUCTION INTERNAL FIXATION (ORIF) ANKLE FRACTURE;  Surgeon: Shona Needles, MD;  Location: [facility];  Service: Orthopedics;  Laterality: Right;  . TOTAL HIP ARTHROPLASTY Right 01/09/2013   Procedure: RIGHT TOTAL HIP ARTHROPLASTY ANTERIOR APPROACH;  Surgeon: [name], MD;  [facility];  Service: Orthopedics;  Laterality: Right;   Current Outpatient Medications on File Prior to Visit  Medication Sig Dispense Refill  . acetaminophen (TYLENOL) 650 MG CR tablet Take 650 mg by mouth every 8 (eight) hours as needed for pain.    Marland Kitchen amLODipine (NORVASC) 5 MG tablet Take 1 tablet (5 mg total) by mouth daily. (Patient taking differently: Take 5 mg by mouth at bedtime. ) 90 tablet 3  . aspirin 325 MG EC tablet Take 1 tablet (325 mg total) by mouth in the morning and at bedtime. 60 tablet 0  . cholecalciferol (VITAMIN D) 25 MCG tablet Take 2 tablets (2,000 Units total) by mouth daily. 180 tablet 0  . fish oil-omega-3 fatty acids 1000 MG capsule Take 1 g by mouth daily.    . hydrochlorothiazide (HYDRODIURIL) 25 MG tablet Take 1 tablet (25 mg total) by mouth daily. 90 tablet 3  . HYDROcodone-acetaminophen (NORCO/VICODIN) 5-325 MG tablet Take 1 tablet by mouth every 6 (six) hours as needed for severe pain. 28 tablet 0  . meclizine (ANTIVERT) 25 MG tablet TAKE ONE TABLET BY MOUTH THREE TIMES DAILY AS NEEDED FOR DIZZINESS (Patient taking differently: Take 25 mg by mouth 3 (three) times daily as needed for dizziness. TAKE ONE TABLET BY MOUTH THREE TIMES DAILY AS NEEDED FOR DIZZINESS) 30 tablet 2  . Multiple Vitamin (MULTIVITAMIN WITH MINERALS) TABS Take 1 tablet by mouth daily.    . pantoprazole  (PROTONIX) 40 MG tablet Take 1 tablet (40 mg total) by mouth daily. 90 tablet 3  . Peppermint Oil (IBGARD PO) Take 2 capsules by mouth See admin instructions. Take 2 capsules in the morning, at lunch, late after noon and bed time and in middle of the night for a total of 10 tablets    . zolpidem (AMBIEN) 10 MG tablet Take 1 tablet (10 mg total) by mouth at bedtime as needed for sleep. (Patient taking differently: Take 5 mg by mouth at bedtime as needed for sleep. ) 15 tablet 1   No current facility-administered medications on file prior to visit.   No Known Allergies Social History   Socioeconomic History  . Marital status: Married    Spouse name: Not on file  .  Number of children: Not on file  . Years of education: Not on file  . Highest education level: Not on file  Occupational History  . Occupation: Retired  Tobacco Use  . Smoking status: Former Smoker    Years: 57.00    Types: Cigarettes    Quit date: 05/25/2007    Years since quitting: 12.2  . Smokeless tobacco: Never Used  Substance and Sexual Activity  . Alcohol use: No    Alcohol/week: 0.0 standard drinks  . Drug use: No  . Sexual activity: Not on file  Other Topics Concern  . 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 Difficulty of Paying Living Expenses:   Food Insecurity:   . Worried About Charity fundraiser in the Last Year:   . Arboriculturist in the Last Year:   Transportation Needs:   . Film/video editor (Medical):   Marland Kitchen Lack of Transportation (Non-Medical):   Physical Activity:   . Days of Exercise per Week:   . Minutes of Exercise per Session:   Stress:   . Feeling of Stress :   Social Connections:   . Frequency of Communication with Friends and Family:   . Frequency of Social Gatherings with Friends and Family:   . Attends Religious Services:   . Active Member of Clubs  or Organizations:   . Attends Archivist Meetings:   Marland Kitchen Marital Status:   Intimate Partner Violence:   . Fear of Current or Ex-Partner:   . Emotionally Abused:   Marland Kitchen Physically Abused:   . Sexually Abused:     Review of Systems  All other systems reviewed and are negative.      Objective:   Physical Exam Vitals reviewed.  Constitutional:      General: He is not in acute distress.    Appearance: Normal appearance. He is normal weight. He is not ill-appearing or toxic-appearing.  Cardiovascular:     Rate and Rhythm: Normal rate and regular rhythm.     Heart sounds: Normal heart sounds.  Pulmonary:     Effort: Pulmonary effort is normal. No respiratory distress.     Breath sounds: Normal breath sounds. No stridor. No wheezing, rhonchi or rales.  Chest:     Chest wall: No tenderness.  Musculoskeletal:     Right lower leg: Edema present.     Left lower leg: Edema present.  Skin:    Findings: No erythema or rash.  Neurological:     Mental Status: He is alert.           Assessment & Plan:  Acute deep vein thrombosis (DVT) of right lower extremity, unspecified vein (HCC)  Xarelto 15 bid for 21 days then 20 mg poqday for a total of 6 months.  Recheck in 1 month or PRN.

## 2019-08-03 NOTE — Telephone Encounter (Signed)
Received results from Imaging.   Reports DVT noted in R PTV. Patient waiting for provider.  MD made aware and new recommendations obtained.   Advised to come to office to discuss with PCP and get medication samples.

## 2019-08-21 DIAGNOSIS — S82851D Displaced trimalleolar fracture of right lower leg, subsequent encounter for closed fracture with routine healing: Secondary | ICD-10-CM | POA: Diagnosis not present

## 2019-08-22 DIAGNOSIS — D485 Neoplasm of uncertain behavior of skin: Secondary | ICD-10-CM | POA: Diagnosis not present

## 2019-08-22 DIAGNOSIS — L570 Actinic keratosis: Secondary | ICD-10-CM | POA: Diagnosis not present

## 2019-08-22 DIAGNOSIS — L905 Scar conditions and fibrosis of skin: Secondary | ICD-10-CM | POA: Diagnosis not present

## 2019-08-22 DIAGNOSIS — C44319 Basal cell carcinoma of skin of other parts of face: Secondary | ICD-10-CM | POA: Diagnosis not present

## 2019-08-28 DIAGNOSIS — H5203 Hypermetropia, bilateral: Secondary | ICD-10-CM | POA: Diagnosis not present

## 2019-08-28 DIAGNOSIS — H4323 Crystalline deposits in vitreous body, bilateral: Secondary | ICD-10-CM | POA: Diagnosis not present

## 2019-08-28 DIAGNOSIS — H2513 Age-related nuclear cataract, bilateral: Secondary | ICD-10-CM | POA: Diagnosis not present

## 2019-08-28 DIAGNOSIS — H524 Presbyopia: Secondary | ICD-10-CM | POA: Diagnosis not present

## 2019-09-04 ENCOUNTER — Other Ambulatory Visit: Payer: Self-pay

## 2019-09-04 ENCOUNTER — Ambulatory Visit (INDEPENDENT_AMBULATORY_CARE_PROVIDER_SITE_OTHER): Payer: Medicare Other | Admitting: Family Medicine

## 2019-09-04 ENCOUNTER — Encounter: Payer: Self-pay | Admitting: Family Medicine

## 2019-09-04 VITALS — BP 126/78 | HR 86 | Temp 96.5°F | Resp 18 | Ht 71.0 in | Wt 232.0 lb

## 2019-09-04 DIAGNOSIS — I82401 Acute embolism and thrombosis of unspecified deep veins of right lower extremity: Secondary | ICD-10-CM

## 2019-09-04 LAB — CBC WITH DIFFERENTIAL/PLATELET
Absolute Monocytes: 582 cells/uL (ref 200–950)
Basophils Absolute: 74 cells/uL (ref 0–200)
Basophils Relative: 0.9 %
Eosinophils Absolute: 156 cells/uL (ref 15–500)
Eosinophils Relative: 1.9 %
HCT: 43.6 % (ref 38.5–50.0)
Hemoglobin: 15.1 g/dL (ref 13.2–17.1)
Lymphs Abs: 1919 cells/uL (ref 850–3900)
MCH: 33.6 pg — ABNORMAL HIGH (ref 27.0–33.0)
MCHC: 34.6 g/dL (ref 32.0–36.0)
MCV: 97.1 fL (ref 80.0–100.0)
MPV: 9.7 fL (ref 7.5–12.5)
Monocytes Relative: 7.1 %
Neutro Abs: 5469 cells/uL (ref 1500–7800)
Neutrophils Relative %: 66.7 %
Platelets: 252 10*3/uL (ref 140–400)
RBC: 4.49 10*6/uL (ref 4.20–5.80)
RDW: 11.8 % (ref 11.0–15.0)
Total Lymphocyte: 23.4 %
WBC: 8.2 10*3/uL (ref 3.8–10.8)

## 2019-09-04 LAB — BASIC METABOLIC PANEL WITHOUT GFR
BUN: 23 mg/dL (ref 7–25)
CO2: 24 mmol/L (ref 20–32)
Calcium: 9.4 mg/dL (ref 8.6–10.3)
Chloride: 100 mmol/L (ref 98–110)
Creat: 1.07 mg/dL (ref 0.70–1.11)
GFR, Est African American: 75 mL/min/1.73m2 (ref >=60)
GFR, Est Non African American: 65 mL/min/1.73m2 (ref >=60)
Glucose, Bld: 119 mg/dL — ABNORMAL HIGH (ref 65–99)
Potassium: 4.2 mmol/L (ref 3.5–5.3)
Sodium: 135 mmol/L (ref 135–146)

## 2019-09-04 MED ORDER — RIVAROXABAN 20 MG PO TABS: 20.0000 mg | ORAL_TABLET | Freq: Every day | ORAL | 4 refills | Status: DC

## 2019-09-04 NOTE — Progress Notes (Signed)
Subjective:    Patient ID: Brendan Hopkins, male    DOB: [birth year], 84 y.o.   MRN: [id number]  HPI 08/02/19 Recently admitted to hospital with ankle fracture.  I copied relevant portions of the discharge summary below:  Admit date: 07/11/2019 Discharge date: 07/13/2019  Admission Diagnoses: Right trimalleolar ankle fracture   Discharge Diagnoses:  Principal Problem:   Closed displaced trimalleolar fracture of right lower leg Active Problems:   Closed right ankle fracture   Procedures Performed: 1. CPT 27822-Open reduction internal fixation of right trimalleolar ankle fracture 2. CPT 27829-Open reduction internal fixation of right syndesmotic disruption  Discharged Condition: good  Hospital Course: Patient presented to [facility] on 07/11/2019 for scheduled surgical fixation of right ankle.  Was taken to the operating room by Dr. Lennette Bihari Haddix for the above procedure.  He tolerated this well without complications.  Was placed in a short leg splint postoperatively with instructions to be nonweightbearing.  Was admitted overnight for pain control and observation.  Began working with physical and occupational therapy starting on postoperative day #1.  Was started on aspirin 325 mg twice daily for DVT prophylaxis starting on postoperative day #1.  Due to lack of support at home patient decided to pursue SNF placement for acute rehab.  Case management/social work was consulted to begin this process on postop day #1.  The remainder of the patient's hospitalization was dedicated to increasing mobility. On 07/13/2019, the patient was tolerating diet,working well with therapies, pain well controlled, vital signs stable,dressings clean, dry, intactand felt stable for dischargeto SNF ([facility]). Patient will follow up as below and knows to call with questions or concerns.  Patient has substantial swelling in his right leg distal to the knee.  He has +2 pitting edema in his  right leg and +1 pitting edema in his left leg.  Calf circumference in his right leg is 18 inches.  Calf circumference of his left leg is 17 inches.  He has some pain in his right leg as well would expect due to the fracture.  He has been immobile and has had to remove his cam walker due to pain in his leg.  He is not putting any weight on his leg.  He denies any chest pain shortness of breath or dyspnea on exertion.  He is not requiring any pain medication other than Tylenol.  He is now only taking a baby aspirin and is not on any other anticoagulant.  However at the skilled nursing facility due to Covid restrictions he was basically confined to his room lying in bed as he was unable to leave the room.  Therefore he has been extremely immobile.  At that time, my plan was: Given the tremendous amount of swelling in his right leg I am going to have the patient evaluated for a DVT.  Otherwise he seems to be doing well.  I encouraged the patient to use a cam walker as a precaution when he is ambulatory even though he is nonweightbearing.  I am concerned that if he awkwardly put his weight on his ankle by accident he could reinjure the ankle and require further surgery.  08/03/19 US revealed right posterior tibial DVT.  Here to discuss.  At that time, my plan was: Xarelto 15 bid for 21 days then 20 mg poqday for a total of 6 months.  Recheck in 1 month or PRN.  09/04/19  While taking Xarelto 15 mg twice a day, the patient experienced episodes  of hematuria.  They were painless.  They resolved after he switched to Xarelto 20 mg a day.  He is seeing no further episodes of hematuria.  He continues to have mild swelling in his right leg particularly worse later in the day after he is been walking on it.  However the pain in his right leg has improved dramatically.  He denies any further episodes of bleeding or bruising. Past Medical History:  Diagnosis Date  . Acute deep vein thrombosis (DVT) of tibial vein of  right lower extremity (HCC)    after ankle fracture/surgery (2/21)  . Arthritis    RIGHT HIP, HANDS, BACK  . Cancer [facility])    Skin cancer   . Cataract   . Cervical radiculopathy    severe C3-4 neuroforaminal stenosis, C5-6 left synovial cyst compressing left nerve root.  . Colon polyps    2005  . COPD (chronic obstructive pulmonary disease) ([place])    SMOKED FOR 42 YRS  . Diverticulosis    2005  . Fatty liver   . GERD (gastroesophageal reflux disease)   . Hypertension   . Pain    RIGHT HIP AND BACK  . Personal history of colonic polyps-adenoma and sessile serrated polyp 07/15/2010  . RBBB    POSS HX AF IN PAST- PAC'S  . Shortness of breath    WITH EXERTION ONLY  . Stroke ([place])    FEW YEARS AGO - PT SAYS "HEAT STROKE" X 2 - BUT NO PHYSICAL DEFICITS  . TIA (transient ischemic attack)    Past Surgical History:  Procedure Laterality Date  . COLONOSCOPY     maybe 3   . COLONOSCOPY W/ POLYPECTOMY    . ORIF ANKLE FRACTURE Right 07/11/2019  . ORIF ANKLE FRACTURE Right 07/11/2019   Procedure: OPEN REDUCTION INTERNAL FIXATION (ORIF) ANKLE FRACTURE;  Surgeon: Shona Needles, MD;  Location: [place];  Service: Orthopedics;  Laterality: Right;  . TOTAL HIP ARTHROPLASTY Right 01/09/2013   Procedure: RIGHT TOTAL HIP ARTHROPLASTY ANTERIOR APPROACH;  Surgeon: [name], MD;  Location: [facility];  Service: Orthopedics;  Laterality: Right;   Current Outpatient Medications on File Prior to Visit  Medication Sig Dispense Refill  . acetaminophen (TYLENOL) 650 MG CR tablet Take 650 mg by mouth every 8 (eight) hours as needed for pain.    Marland Kitchen amLODipine (NORVASC) 5 MG tablet Take 1 tablet (5 mg total) by mouth daily. (Patient taking differently: Take 5 mg by mouth at bedtime. ) 90 tablet 3  . cholecalciferol (VITAMIN D) 25 MCG tablet Take 2 tablets (2,000 Units total) by mouth daily. 180 tablet 0  . fish oil-omega-3 fatty acids 1000 MG capsule Take 1 g by mouth daily.    . hydrochlorothiazide  (HYDRODIURIL) 25 MG tablet Take 1 tablet (25 mg total) by mouth daily. 90 tablet 3  . HYDROcodone-acetaminophen (NORCO/VICODIN) 5-325 MG tablet Take 1 tablet by mouth every 6 (six) hours as needed for severe pain. 28 tablet 0  . meclizine (ANTIVERT) 25 MG tablet TAKE ONE TABLET BY MOUTH THREE TIMES DAILY AS NEEDED FOR DIZZINESS (Patient taking differently: Take 25 mg by mouth 3 (three) times daily as needed for dizziness. TAKE ONE TABLET BY MOUTH THREE TIMES DAILY AS NEEDED FOR DIZZINESS) 30 tablet 2  . Multiple Vitamin (MULTIVITAMIN WITH MINERALS) TABS Take 1 tablet by mouth daily.    . pantoprazole (PROTONIX) 40 MG tablet Take 1 tablet (40 mg total) by mouth daily. 90 tablet 3  . Peppermint Oil (  IBGARD PO) Take 2 capsules by mouth See admin instructions. Take 2 capsules in the morning, at lunch, late after noon and bed time and in middle of the night for a total of 10 tablets    . Rivaroxaban 15 & 20 MG TBPK Follow package directions: Take one 15mg  tablet by mouth twice a day. On day 22, switch to one 20mg  tablet once a day. Take with food. 51 each 0  . zolpidem (AMBIEN) 10 MG tablet Take 1 tablet (10 mg total) by mouth at bedtime as needed for sleep. (Patient taking differently: Take 5 mg by mouth at bedtime as needed for sleep. ) 15 tablet 1   No current facility-administered medications on file prior to visit.   No Known Allergies Social History   Socioeconomic History  . Marital status: Married    Spouse name: Not on file  . Number of children: Not on file  . Years of education: Not on file  . Highest education level: Not on file  Occupational History  . Occupation: Retired  Tobacco Use  . Smoking status: Former Smoker    Years: 57.00    Types: Cigarettes    Quit date: 05/25/2007    Years since quitting: 12.2  . Smokeless tobacco: Never Used  Substance and Sexual Activity  . Alcohol use: No    Alcohol/week: 0.0 standard drinks  . Drug use: No  . Sexual activity: Not on file    Other Topics Concern  . 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 Difficulty of Paying Living Expenses:   Food Insecurity:   . Worried About Charity fundraiser in the Last Year:   . Arboriculturist in the Last Year:   Transportation Needs:   . Film/video editor (Medical):   Marland Kitchen Lack of Transportation (Non-Medical):   Physical Activity:   . Days of Exercise per Week:   . Minutes of Exercise per Session:   Stress:   . Feeling of Stress :   Social Connections:   . Frequency of Communication with Friends and Family:   . Frequency of Social Gatherings with Friends and Family:   . Attends Religious Services:   . Active Member of Clubs or Organizations:   . Attends Archivist Meetings:   Marland Kitchen Marital Status:   Intimate Partner Violence:   . Fear of Current or Ex-Partner:   . Emotionally Abused:   Marland Kitchen Physically Abused:   . Sexually Abused:     Review of Systems  All other systems reviewed and are negative.      Objective:   Physical Exam Vitals reviewed.  Constitutional:      General: He is not in acute distress.    Appearance: Normal appearance. He is normal weight. He is not ill-appearing or toxic-appearing.  Cardiovascular:     Rate and Rhythm: Normal rate and regular rhythm.     Heart sounds: Normal heart sounds.  Pulmonary:     Effort: Pulmonary effort is normal. No respiratory distress.     Breath sounds: Normal breath sounds. No stridor. No wheezing, rhonchi or rales.  Chest:     Chest wall: No tenderness.  Musculoskeletal:     Right lower leg: Edema present.     Left lower leg: Edema present.  Skin:    Findings: No erythema  or rash.  Neurological:     Mental Status: He is alert.           Assessment & Plan:    Acute deep vein thrombosis (DVT) of right lower extremity, unspecified vein (HCC) - Plan: CBC with  Differential/Platelet, BASIC METABOLIC PANEL WITH GFR  Switch to Xarelto 20 mg a day for an additional 5 months.  Recheck CBC today to rule out thrombocytopenia or severe anemia and monitor renal function with a BMP.  As long as the patient does not see any further episodes of hematuria I will not work-up further.  If he experiences gross hematuria again, I would recommend a urology consultation for possible cystoscopy.

## 2019-09-07 ENCOUNTER — Telehealth: Payer: Self-pay | Admitting: Family Medicine

## 2019-09-07 ENCOUNTER — Other Ambulatory Visit: Payer: Self-pay | Admitting: Family Medicine

## 2019-09-07 MED ORDER — SOLIFENACIN SUCCINATE 10 MG PO TABS: 10.0000 mg | ORAL_TABLET | Freq: Every day | ORAL | 1 refills | Status: DC

## 2019-09-07 NOTE — Telephone Encounter (Signed)
Pt aware.

## 2019-09-07 NOTE — Telephone Encounter (Signed)
Pt wanted to know if there was something that you could prescribe him for overactive bladder. He is having frequent urination at night.

## 2019-09-07 NOTE — Telephone Encounter (Signed)
I will send in vesicare.

## 2019-09-11 DIAGNOSIS — S82851D Displaced trimalleolar fracture of right lower leg, subsequent encounter for closed fracture with routine healing: Secondary | ICD-10-CM | POA: Diagnosis not present

## 2019-10-02 DIAGNOSIS — S82851D Displaced trimalleolar fracture of right lower leg, subsequent encounter for closed fracture with routine healing: Secondary | ICD-10-CM | POA: Diagnosis not present

## 2019-10-11 ENCOUNTER — Other Ambulatory Visit: Payer: Self-pay | Admitting: Family Medicine

## 2019-10-11 MED ORDER — MECLIZINE HCL 25 MG PO TABS: ORAL_TABLET | ORAL | 2 refills | Status: DC

## 2019-10-11 NOTE — Telephone Encounter (Signed)
I refilled the Meclizine - ok to refill Ambien?

## 2019-10-11 NOTE — Telephone Encounter (Signed)
Patient requesting a refill on his meclizine 25 mg, and sleeping pill he don't remember name call into [facility].  CB# [phone]

## 2019-10-12 MED ORDER — ZOLPIDEM TARTRATE 10 MG PO TABS: 10.0000 mg | ORAL_TABLET | Freq: Every evening | ORAL | 1 refills | Status: DC | PRN

## 2019-10-15 DIAGNOSIS — C44319 Basal cell carcinoma of skin of other parts of face: Secondary | ICD-10-CM | POA: Diagnosis not present

## 2019-10-23 DIAGNOSIS — S82851D Displaced trimalleolar fracture of right lower leg, subsequent encounter for closed fracture with routine healing: Secondary | ICD-10-CM | POA: Diagnosis not present

## 2019-11-06 ENCOUNTER — Ambulatory Visit (INDEPENDENT_AMBULATORY_CARE_PROVIDER_SITE_OTHER): Payer: Medicare Other | Admitting: Nurse Practitioner

## 2019-11-06 VITALS — BP 140/88 | HR 106 | Temp 97.6°F | Resp 18 | Wt 236.6 lb

## 2019-11-06 DIAGNOSIS — M7989 Other specified soft tissue disorders: Secondary | ICD-10-CM | POA: Diagnosis not present

## 2019-11-06 DIAGNOSIS — L03119 Cellulitis of unspecified part of limb: Secondary | ICD-10-CM

## 2019-11-06 DIAGNOSIS — I824Y1 Acute embolism and thrombosis of unspecified deep veins of right proximal lower extremity: Secondary | ICD-10-CM

## 2019-11-06 DIAGNOSIS — L02619 Cutaneous abscess of unspecified foot: Secondary | ICD-10-CM | POA: Diagnosis not present

## 2019-11-06 MED ORDER — CEFTRIAXONE SODIUM 500 MG IJ SOLR
500.0000 mg | Freq: Once | INTRAMUSCULAR | Status: AC
  Administered 2019-11-06: 500 mg via INTRAMUSCULAR

## 2019-11-06 MED ORDER — SULFAMETHOXAZOLE-TRIMETHOPRIM 800-160 MG PO TABS: 1.0000 | ORAL_TABLET | Freq: Two times a day (BID) | ORAL | 7 refills | Status: DC

## 2019-11-06 NOTE — Progress Notes (Signed)
Established Patient Office Visit  Subjective:  Patient ID: Brendan Hopkins, male    DOB: 18-Jul-1935  Age: 84 y.o. MRN: [id number]  CC:  Chief Complaint  Patient presents with  . Leg Pain    R leg, edema, can't remember if injuried    HPI Brendan Hopkins is a 84 year old male presenting to the clinic with increased swelling to both le but more in the right over the past two day. He reports that this morning he has a circular area that drained "pus", no foul odor, no fever or chills. He has a known h/o Dec 2020 falling and injuring right leg bones s/p surgery with secondary DVT to RLE, cellulitis, and infection to the right outer ankle located where a "surgical pin was". The pt reports that since the injury he has declined physically and gained much weight. 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   Past Medical History:  Diagnosis Date  . Acute deep vein thrombosis (DVT) of tibial vein of right lower extremity (HCC)    after ankle fracture/surgery (2/21)  . Arthritis    RIGHT HIP, HANDS, BACK  . Cancer [facility])    Skin cancer   . Cataract   . Cervical radiculopathy    severe C3-4 neuroforaminal stenosis, C5-6 left synovial cyst compressing left nerve root.  . Colon polyps    2005  . COPD (chronic obstructive pulmonary disease) ([place])    SMOKED FOR 51 YRS  . Diverticulosis    2005  . Fatty liver   . GERD (gastroesophageal reflux disease)   . Hypertension   . Pain    RIGHT HIP AND BACK  . Personal history of colonic polyps-adenoma and sessile serrated polyp 07/15/2010  . RBBB    POSS HX AF IN PAST- PAC'S  . Shortness of breath    WITH EXERTION ONLY  . Stroke ([place])    FEW YEARS AGO - PT SAYS "HEAT STROKE" X 2 - BUT NO PHYSICAL DEFICITS  . TIA (transient ischemic attack)     Past Surgical History:  Procedure  Laterality Date  . COLONOSCOPY     maybe 3   . COLONOSCOPY W/ POLYPECTOMY    . ORIF ANKLE FRACTURE Right 07/11/2019  . ORIF ANKLE FRACTURE Right 07/11/2019   Procedure: OPEN REDUCTION INTERNAL FIXATION (ORIF) ANKLE FRACTURE;  Surgeon: Shona Needles, MD;  Location: [place];  Service: Orthopedics;  Laterality: Right;  . TOTAL HIP ARTHROPLASTY Right 01/09/2013   Procedure: RIGHT TOTAL HIP ARTHROPLASTY ANTERIOR APPROACH;  Surgeon: [name], MD;  [facility];  Service: Orthopedics;  Laterality: Right;    Family History  Problem Relation Age of Onset  . Cancer Father   . Cancer Mother        died more of old age at age 41  . Colon cancer Neg Hx   . Esophageal cancer Neg Hx   . Prostate cancer Neg Hx   . Rectal cancer Neg Hx   . Stomach cancer Neg Hx     Social History   Socioeconomic History  . Marital status: Married    Spouse name: Not on file  . Number of children: Not on file  . Years of education: Not on file  . Highest education level: Not on file  Occupational History  . Occupation: Retired  Tobacco  Use  . Smoking status: Former Smoker    Years: 57.00    Types: Cigarettes    Quit date: 05/25/2007    Years since quitting: 12.4  . Smokeless tobacco: Never Used  Vaping Use  . Vaping Use: Never used  Substance and Sexual Activity  . Alcohol use: No    Alcohol/week: 0.0 standard drinks  . Drug use: No  . Sexual activity: Not on file  Other Topics Concern  . 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 Difficulty of Paying Living Expenses:   Food Insecurity:   . Worried About Charity fundraiser in the Last Year:   . Arboriculturist in the Last Year:   Transportation Needs:   . Film/video editor (Medical):   Marland Kitchen Lack of Transportation (Non-Medical):   Physical Activity:   . Days of Exercise per Week:   . Minutes of Exercise per  Session:   Stress:   . Feeling of Stress :   Social Connections:   . Frequency of Communication with Friends and Family:   . Frequency of Social Gatherings with Friends and Family:   . Attends Religious Services:   . Active Member of Clubs or Organizations:   . Attends Archivist Meetings:   Marland Kitchen Marital Status:   Intimate Partner Violence:   . Fear of Current or Ex-Partner:   . Emotionally Abused:   Marland Kitchen Physically Abused:   . Sexually Abused:     Outpatient Medications Prior to Visit  Medication Sig Dispense Refill  . acetaminophen (TYLENOL) 650 MG CR tablet Take 650 mg by mouth every 8 (eight) hours as needed for pain.    Marland Kitchen amLODipine (NORVASC) 5 MG tablet Take 1 tablet (5 mg total) by mouth daily. (Patient taking differently: Take 5 mg by mouth at bedtime. ) 90 tablet 3  . fish oil-omega-3 fatty acids 1000 MG capsule Take 1 g by mouth daily.    . hydrochlorothiazide (HYDRODIURIL) 25 MG tablet Take 1 tablet (25 mg total) by mouth daily. 90 tablet 3  . HYDROcodone-acetaminophen (NORCO/VICODIN) 5-325 MG tablet Take 1 tablet by mouth every 6 (six) hours as needed for severe pain. 28 tablet 0  . meclizine (ANTIVERT) 25 MG tablet TAKE ONE TABLET BY MOUTH THREE TIMES DAILY AS NEEDED FOR DIZZINESS 30 tablet 2  . Multiple Vitamin (MULTIVITAMIN WITH MINERALS) TABS Take 1 tablet by mouth daily.    . pantoprazole (PROTONIX) 40 MG tablet Take 1 tablet (40 mg total) by mouth daily. 90 tablet 3  . Peppermint Oil (IBGARD PO) Take 2 capsules by mouth See admin instructions. Take 2 capsules in the morning, at lunch, late after noon and bed time and in middle of the night for a total of 10 tablets    . rivaroxaban (XARELTO) 20 MG TABS tablet Take 1 tablet (20 mg total) by mouth daily with supper. 30 tablet 4  . Rivaroxaban 15 & 20 MG TBPK Follow package directions: Take one 15mg  tablet by mouth twice a day. On day 22, switch to one 20mg  tablet once a day. Take with food. 51 each 0  . solifenacin  (VESICARE) 10 MG tablet Take 1 tablet (10 mg total) by mouth daily. 30 tablet 1  . zolpidem (AMBIEN) 10 MG tablet Take 1 tablet (10 mg total) by mouth at bedtime  as needed for sleep. 15 tablet 1   No facility-administered medications prior to visit.    No Known Allergies  ROS Review of Systems  All other systems reviewed and are negative.     Objective:    Physical Exam Vitals and nursing note reviewed.  Constitutional:      General: He is not in acute distress.    Appearance: Normal appearance. He is not ill-appearing, toxic-appearing or diaphoretic.  HENT:     Head: Normocephalic.  Eyes:     Extraocular Movements: Extraocular movements intact.     Conjunctiva/sclera: Conjunctivae normal.     Pupils: Pupils are equal, round, and reactive to light.  Cardiovascular:     Rate and Rhythm: Normal rate.  Pulmonary:     Effort: Pulmonary effort is normal.  Skin:    Capillary Refill: Capillary refill takes less than 2 seconds.     Coloration: Skin is not cyanotic, jaundiced or mottled.          Comments: Erythema to lower right extremie from ankle up 1/4 of lower half of extremity. The posterial ankle with round 1cm with yellow center, I/D of surface sent for wound culture.  consistent with cellulitis.   The left lower half ext with edema from below knee to ankle no erythema non pitting.  Neurological:     General: No focal deficit present.     Mental Status: He is alert and oriented to person, place, and time.  Psychiatric:        Mood and Affect: Mood normal.        Behavior: Behavior normal.        Thought Content: Thought content normal.     BP 140/88 (BP Location: Left Arm, Patient Position: Sitting, Cuff Size: Large)   Pulse (!) 106   Temp 97.6 F (36.4 C) (Temporal)   Resp 18   Wt 236 lb 9.6 oz (107.3 kg)   SpO2 96%   BMI 33.00 kg/m  Wt Readings from Last 3 Encounters:  11/06/19 236 lb 9.6 oz (107.3 kg)  09/04/19 232 lb (105.2 kg)  08/03/19 232 lb (105.2  kg)     Health Maintenance Due  Topic Date Due  . COVID-19 Vaccine (1) Never done  . TETANUS/TDAP  09/09/2013  . COLONOSCOPY  12/28/2018    There are no preventive care reminders to display for this patient.  Lab Results  Component Value Date   TSH 3.630 12/10/2013   Lab Results  Component Value Date   WBC 8.2 09/04/2019   HGB 15.1 09/04/2019   HCT 43.6 09/04/2019   MCV 97.1 09/04/2019   PLT 252 09/04/2019   Lab Results  Component Value Date   NA 135 09/04/2019   K 4.2 09/04/2019   CO2 24 09/04/2019   GLUCOSE 119 (H) 09/04/2019   BUN 23 09/04/2019   CREATININE 1.07 09/04/2019   BILITOT 0.5 08/02/2019   ALKPHOS 77 12/14/2016   AST 18 08/02/2019   ALT 13 08/02/2019   PROT 6.7 08/02/2019   ALBUMIN 4.5 12/14/2016   CALCIUM 9.4 09/04/2019   ANIONGAP 11 07/13/2019   Lab Results  Component Value Date   CHOL 175 01/26/2019   Lab Results  Component Value Date   HDL 46 01/26/2019   Lab Results  Component Value Date   LDLCALC 107 (H) 01/26/2019   Lab Results  Component Value Date   TRIG 121 01/26/2019   Lab Results  Component Value Date   CHOLHDL 3.8 01/26/2019  Lab Results  Component Value Date   HGBA1C 6.0 (H) 01/26/2019      Assessment & Plan:   Problem List Items Addressed This Visit    None    Visit Diagnoses    Cellulitis and abscess of foot    -  Primary   Relevant Medications   cefTRIAXone (ROCEPHIN) injection 500 mg (Completed)   sulfamethoxazole-trimethoprim (BACTRIM DS) 800-160 MG tablet   Other Relevant Orders   WOUND CULTURE   INCISION AND DRAINAGE   Acute deep vein thrombosis (DVT) of proximal vein of right lower extremity (HCC)       Relevant Medications   furosemide (LASIX) 20 MG tablet   Other Relevant Orders   US Venous Img Lower Unilateral Right   Swelling of both lower extremities       Relevant Medications   furosemide (LASIX) 20 MG tablet   Other Relevant Orders   BASIC METABOLIC PANEL WITH GFR    I/D of top  surface only of ankle, right yellow center abscess for wound culture for sensitivity, Rocephin 500 mg IM 10 minutes in clinic tolerated well no reaction, will start and complete course of Bactrim.   Pt has known h/o Dec 2020 bone injury to right leg s/p surgery with development of right DVT LE diagnosed by [facility] in 07/2019 and repeated cellulitis, wound infection, and continual ble edema.   Measures to reduce edema such as compression device has been limited r/t DVT. The pt as stated in HPI has had a decline in his physical status and gained weight over this course.   He would like to get back to his previous physical state. I will f./u image to evaluate resolution to consider treatment course as status  Edema certainly to bilateral le is a high risk factor for repeated cellulitis and continually decline in this pt physical status.   Most recent labs show good kidney function therefore I will order Lasix 20 mg QOD to help reduce the ble edema and pt instructed to eat one banana daily for 1 month with BMP in 2 weeks, pt will notify clinic of muscle cramps right away educated on low potassium r/t Lasix.   DVT precaution reiterated.   He will continue Xarelto at this point.   Once DVT has resolved the course of treatment will be to order home sequential compression device r/t pt not good candidate for compression stockings, continue lasix if needed/indicated, close monitoring electrolyte imbalance, discontinue Xarelto as soon as clinically possible to reduce bleeding risk r/t falls as pt is High fall risk especially with discomfort of ble edema.  Pts printed instructions: Elevate both lower extremities above the level of the heart to promote decreased swelling.   IM antibiotic given to you in clinic today and you did not have an allergic reaction and tolerated well.   I have prescribed an oral antibiotic start and complete.  I will prescribe Lasix QOD, you should eat a daily banana and report any  muscle cramps  U/S of Right Lower EXT to eval 3 month post of the DVT 3pm 11/07/2019 instruction provided verbally with verbal u/u feedback  Follow up lab in 2 weeks.  Continue DVT precaution and Xarelto at this point, we will consider compression device after DVT has resolved. Meds ordered this encounter  Medications  . cefTRIAXone (ROCEPHIN) injection 500 mg  . sulfamethoxazole-trimethoprim (BACTRIM DS) 800-160 MG tablet    Sig: Take 1 tablet by mouth 2 (two) times daily.    Dispense:  14 tablet    Refill:  7  . furosemide (LASIX) 20 MG tablet    Sig: Take 1 tablet (20 mg total) by mouth every other day.    Dispense:  30 tablet    Refill:  0    Follow-up: Return in about 2 days (around 11/08/2019).    Annie Main, FNP

## 2019-11-07 ENCOUNTER — Telehealth: Payer: Self-pay | Admitting: Nurse Practitioner

## 2019-11-07 ENCOUNTER — Ambulatory Visit (HOSPITAL_COMMUNITY)
Admission: RE | Admit: 2019-11-07 | Discharge: 2019-11-07 | Disposition: A | Discharge status: Home / Self Care | Payer: Medicare Other | Source: Ambulatory Visit | Attending: Nurse Practitioner | Admitting: Nurse Practitioner

## 2019-11-07 ENCOUNTER — Other Ambulatory Visit: Payer: Self-pay

## 2019-11-07 ENCOUNTER — Other Ambulatory Visit: Payer: Self-pay | Admitting: Family Medicine

## 2019-11-07 DIAGNOSIS — I824Y1 Acute embolism and thrombosis of unspecified deep veins of right proximal lower extremity: Secondary | ICD-10-CM | POA: Diagnosis not present

## 2019-11-07 MED ORDER — FUROSEMIDE 20 MG PO TABS: 20.0000 mg | ORAL_TABLET | ORAL | 0 refills | Status: DC

## 2019-11-07 NOTE — Telephone Encounter (Signed)
Left a detailed message on pt's VM.

## 2019-11-07 NOTE — Patient Instructions (Signed)
Elevate both lower extremities above the level of the heart to promote decreased swelling.   IM antibiotic given to you in clinic today and you did not have an allergic reaction and tolerated well.   I have prescribed an oral antibiotic start and complete.  I will prescribe Lasix QOD, you should eat a daily banana and report any muscle cramps  U/S of Right Lower EXT to eval 3 month post of the DVT 3pm 11/07/2019 instruction provided verbally with verbal u/u feedback  Follow up lab in 2 weeks.  Continue DVT precaution and Xarelto at this point, we will consider compression device after DVT has resolved.

## 2019-11-07 NOTE — Telephone Encounter (Signed)
Instruct pt:  Elevate both lower extremities above the level of the heart to promote decreased swelling.   IM antibiotic given to you in clinic today and you did not have an allergic reaction and tolerated well.   I have prescribed an oral antibiotic start and complete.  I will prescribe Lasix QOD, you should eat a daily banana and report any muscle cramps  U/S of Right Lower EXT to eval 3 month post of the DVT 3pm 11/07/2019 instruction provided verbally with verbal u/u feedback  Follow up lab in 2 weeks.  Continue DVT precaution and Xarelto at this point, we will consider compression device after DVT has resolved.

## 2019-11-07 NOTE — Progress Notes (Signed)
1. DVT is still there. We could see vascular to vascular to consider our option or continue the course of Xarelto and repeat picture in another 3 months. There is some improvement. If continue same course then I would recommend Lasix every other day to help minimize the edema to minimize the status to minimize the cellulitis/infection risk, also elevate. We cannot do compression on the DVT side or we could dislodge however avoid wearing those sock he had on yesterday and he could wear a compression stocking to help the other extremity swelling. Also, to keep the potassium up eat banana every day and let us know if he has any muscle cramps and lab every 3 month BMP.   2. Let me know what he wants to do.

## 2019-11-08 ENCOUNTER — Ambulatory Visit: Payer: Medicare Other | Admitting: Nurse Practitioner

## 2019-11-09 LAB — WOUND CULTURE
MICRO NUMBER:: 10593179
SPECIMEN QUALITY:: ADEQUATE

## 2019-11-18 ENCOUNTER — Inpatient Hospital Stay (HOSPITAL_COMMUNITY)
Admission: EM | Admit: 2019-11-18 | Discharge: 2019-11-22 | DRG: 871 | Disposition: A | Discharge status: Home / Self Care | Payer: Medicare Other | Attending: Internal Medicine | Admitting: Internal Medicine

## 2019-11-18 ENCOUNTER — Encounter (HOSPITAL_COMMUNITY): Payer: Self-pay | Admitting: Emergency Medicine

## 2019-11-18 ENCOUNTER — Emergency Department (HOSPITAL_COMMUNITY): Payer: Medicare Other

## 2019-11-18 ENCOUNTER — Other Ambulatory Visit: Payer: Self-pay

## 2019-11-18 DIAGNOSIS — R310 Gross hematuria: Secondary | ICD-10-CM | POA: Diagnosis not present

## 2019-11-18 DIAGNOSIS — L03115 Cellulitis of right lower limb: Secondary | ICD-10-CM | POA: Diagnosis not present

## 2019-11-18 DIAGNOSIS — M25571 Pain in right ankle and joints of right foot: Secondary | ICD-10-CM | POA: Diagnosis not present

## 2019-11-18 DIAGNOSIS — J449 Chronic obstructive pulmonary disease, unspecified: Secondary | ICD-10-CM | POA: Diagnosis not present

## 2019-11-18 DIAGNOSIS — T8141XA Infection following a procedure, superficial incisional surgical site, initial encounter: Secondary | ICD-10-CM | POA: Diagnosis not present

## 2019-11-18 DIAGNOSIS — Z86718 Personal history of other venous thrombosis and embolism: Secondary | ICD-10-CM

## 2019-11-18 DIAGNOSIS — E7800 Pure hypercholesterolemia, unspecified: Secondary | ICD-10-CM | POA: Diagnosis not present

## 2019-11-18 DIAGNOSIS — M7989 Other specified soft tissue disorders: Secondary | ICD-10-CM | POA: Diagnosis not present

## 2019-11-18 DIAGNOSIS — J440 Chronic obstructive pulmonary disease with acute lower respiratory infection: Secondary | ICD-10-CM | POA: Diagnosis present

## 2019-11-18 DIAGNOSIS — Z8781 Personal history of (healed) traumatic fracture: Secondary | ICD-10-CM

## 2019-11-18 DIAGNOSIS — E785 Hyperlipidemia, unspecified: Secondary | ICD-10-CM | POA: Diagnosis present

## 2019-11-18 DIAGNOSIS — S82891G Other fracture of right lower leg, subsequent encounter for closed fracture with delayed healing: Secondary | ICD-10-CM | POA: Diagnosis not present

## 2019-11-18 DIAGNOSIS — Z20822 Contact with and (suspected) exposure to covid-19: Secondary | ICD-10-CM | POA: Diagnosis present

## 2019-11-18 DIAGNOSIS — A419 Sepsis, unspecified organism: Principal | ICD-10-CM | POA: Diagnosis present

## 2019-11-18 DIAGNOSIS — N200 Calculus of kidney: Secondary | ICD-10-CM | POA: Diagnosis not present

## 2019-11-18 DIAGNOSIS — Z6833 Body mass index (BMI) 33.0-33.9, adult: Secondary | ICD-10-CM

## 2019-11-18 DIAGNOSIS — Y831 Surgical operation with implant of artificial internal device as the cause of abnormal reaction of the patient, or of later complication, without mention of misadventure at the time of the procedure: Secondary | ICD-10-CM | POA: Diagnosis present

## 2019-11-18 DIAGNOSIS — E669 Obesity, unspecified: Secondary | ICD-10-CM | POA: Diagnosis present

## 2019-11-18 DIAGNOSIS — R0902 Hypoxemia: Secondary | ICD-10-CM | POA: Diagnosis not present

## 2019-11-18 DIAGNOSIS — S82891A Other fracture of right lower leg, initial encounter for closed fracture: Secondary | ICD-10-CM | POA: Diagnosis present

## 2019-11-18 DIAGNOSIS — E871 Hypo-osmolality and hyponatremia: Secondary | ICD-10-CM | POA: Diagnosis present

## 2019-11-18 DIAGNOSIS — Z87891 Personal history of nicotine dependence: Secondary | ICD-10-CM

## 2019-11-18 DIAGNOSIS — R319 Hematuria, unspecified: Secondary | ICD-10-CM

## 2019-11-18 DIAGNOSIS — J189 Pneumonia, unspecified organism: Secondary | ICD-10-CM

## 2019-11-18 DIAGNOSIS — I10 Essential (primary) hypertension: Secondary | ICD-10-CM | POA: Diagnosis present

## 2019-11-18 DIAGNOSIS — S52201D Unspecified fracture of shaft of right ulna, subsequent encounter for closed fracture with routine healing: Secondary | ICD-10-CM | POA: Diagnosis not present

## 2019-11-18 DIAGNOSIS — N179 Acute kidney failure, unspecified: Secondary | ICD-10-CM | POA: Diagnosis present

## 2019-11-18 DIAGNOSIS — M25471 Effusion, right ankle: Secondary | ICD-10-CM

## 2019-11-18 DIAGNOSIS — G4700 Insomnia, unspecified: Secondary | ICD-10-CM | POA: Diagnosis present

## 2019-11-18 DIAGNOSIS — J9811 Atelectasis: Secondary | ICD-10-CM | POA: Diagnosis not present

## 2019-11-18 DIAGNOSIS — S93431A Sprain of tibiofibular ligament of right ankle, initial encounter: Secondary | ICD-10-CM | POA: Diagnosis not present

## 2019-11-18 DIAGNOSIS — M7731 Calcaneal spur, right foot: Secondary | ICD-10-CM | POA: Diagnosis not present

## 2019-11-18 DIAGNOSIS — K219 Gastro-esophageal reflux disease without esophagitis: Secondary | ICD-10-CM

## 2019-11-18 DIAGNOSIS — Z9889 Other specified postprocedural states: Secondary | ICD-10-CM

## 2019-11-18 DIAGNOSIS — Z7901 Long term (current) use of anticoagulants: Secondary | ICD-10-CM | POA: Diagnosis not present

## 2019-11-18 DIAGNOSIS — S82851K Displaced trimalleolar fracture of right lower leg, subsequent encounter for closed fracture with nonunion: Secondary | ICD-10-CM

## 2019-11-18 DIAGNOSIS — Z96641 Presence of right artificial hip joint: Secondary | ICD-10-CM | POA: Diagnosis present

## 2019-11-18 DIAGNOSIS — R6510 Systemic inflammatory response syndrome (SIRS) of non-infectious origin without acute organ dysfunction: Secondary | ICD-10-CM | POA: Diagnosis present

## 2019-11-18 DIAGNOSIS — S82851A Displaced trimalleolar fracture of right lower leg, initial encounter for closed fracture: Secondary | ICD-10-CM | POA: Diagnosis not present

## 2019-11-18 DIAGNOSIS — I82441 Acute embolism and thrombosis of right tibial vein: Secondary | ICD-10-CM | POA: Diagnosis not present

## 2019-11-18 DIAGNOSIS — L03119 Cellulitis of unspecified part of limb: Secondary | ICD-10-CM

## 2019-11-18 DIAGNOSIS — L02619 Cutaneous abscess of unspecified foot: Secondary | ICD-10-CM

## 2019-11-18 LAB — COMPREHENSIVE METABOLIC PANEL
ALT: 17 U/L (ref 0–44)
AST: 21 U/L (ref 15–41)
Albumin: 3.6 g/dL (ref 3.5–5.0)
Alkaline Phosphatase: 60 U/L (ref 38–126)
Anion gap: 12 (ref 5–15)
BUN: 20 mg/dL (ref 8–23)
CO2: 22 mmol/L (ref 22–32)
Calcium: 9 mg/dL (ref 8.9–10.3)
Chloride: 99 mmol/L (ref 98–111)
Creatinine, Ser: 1.36 mg/dL — ABNORMAL HIGH (ref 0.61–1.24)
GFR calc Af Amer: 56 mL/min — ABNORMAL LOW (ref >60)
GFR calc non Af Amer: 48 mL/min — ABNORMAL LOW (ref >60)
Glucose, Bld: 142 mg/dL — ABNORMAL HIGH (ref 70–99)
Potassium: 3.8 mmol/L (ref 3.5–5.1)
Sodium: 133 mmol/L — ABNORMAL LOW (ref 135–145)
Total Bilirubin: 1.1 mg/dL (ref 0.3–1.2)
Total Protein: 7 g/dL (ref 6.5–8.1)

## 2019-11-18 LAB — CBC WITH DIFFERENTIAL/PLATELET
Abs Immature Granulocytes: 0.03 10*3/uL (ref 0.00–0.07)
Basophils Absolute: 0.1 10*3/uL (ref 0.0–0.1)
Basophils Relative: 1 %
Eosinophils Absolute: 0 10*3/uL (ref 0.0–0.5)
Eosinophils Relative: 0 %
HCT: 41.9 % (ref 39.0–52.0)
Hemoglobin: 13.7 g/dL (ref 13.0–17.0)
Immature Granulocytes: 0 %
Lymphocytes Relative: 8 %
Lymphs Abs: 1 10*3/uL (ref 0.7–4.0)
MCH: 30.6 pg (ref 26.0–34.0)
MCHC: 32.7 g/dL (ref 30.0–36.0)
MCV: 93.7 fL (ref 80.0–100.0)
Monocytes Absolute: 0.8 10*3/uL (ref 0.1–1.0)
Monocytes Relative: 6 %
Neutro Abs: 9.9 10*3/uL — ABNORMAL HIGH (ref 1.7–7.7)
Neutrophils Relative %: 85 %
Platelets: 229 10*3/uL (ref 150–400)
RBC: 4.47 MIL/uL (ref 4.22–5.81)
RDW: 13.1 % (ref 11.5–15.5)
WBC: 11.8 10*3/uL — ABNORMAL HIGH (ref 4.0–10.5)
nRBC: 0 % (ref 0.0–0.2)

## 2019-11-18 LAB — URINALYSIS, ROUTINE W REFLEX MICROSCOPIC
Bacteria, UA: NONE SEEN
Bilirubin Urine: NEGATIVE
Glucose, UA: NEGATIVE mg/dL
Ketones, ur: NEGATIVE mg/dL
Leukocytes,Ua: NEGATIVE
Nitrite: NEGATIVE
Protein, ur: 30 mg/dL — AB
RBC / HPF: >50 RBC/hpf — ABNORMAL HIGH (ref 0–5)
Specific Gravity, Urine: 1.023 (ref 1.005–1.030)
pH: 6 (ref 5.0–8.0)

## 2019-11-18 LAB — SARS CORONAVIRUS 2 BY RT PCR (HOSPITAL ORDER, PERFORMED IN ~~LOC~~ HOSPITAL LAB): SARS Coronavirus 2: NEGATIVE

## 2019-11-18 LAB — PROCALCITONIN: Procalcitonin: 0.12 ng/mL

## 2019-11-18 LAB — PHOSPHORUS: Phosphorus: 3 mg/dL (ref 2.5–4.6)

## 2019-11-18 LAB — MAGNESIUM: Magnesium: 1.9 mg/dL (ref 1.7–2.4)

## 2019-11-18 LAB — LACTIC ACID, PLASMA: Lactic Acid, Venous: 1.4 mmol/L (ref 0.5–1.9)

## 2019-11-18 LAB — STREP PNEUMONIAE URINARY ANTIGEN: Strep Pneumo Urinary Antigen: NEGATIVE

## 2019-11-18 MED ORDER — OXYCODONE HCL 5 MG PO TABS: 5.0000 mg | ORAL_TABLET | ORAL | Status: DC | PRN

## 2019-11-18 MED ORDER — SODIUM CHLORIDE 0.9 % IV SOLN
500.0000 mg | INTRAVENOUS | Status: DC
  Administered 2019-11-19 – 2019-11-21 (×3): 500 mg via INTRAVENOUS
  Filled 2019-11-18 (×4): qty 500

## 2019-11-18 MED ORDER — PANTOPRAZOLE SODIUM 40 MG PO TBEC
40.0000 mg | DELAYED_RELEASE_TABLET | Freq: Every day | ORAL | Status: DC
  Administered 2019-11-18 – 2019-11-22 (×5): 40 mg via ORAL
  Filled 2019-11-18 (×5): qty 1

## 2019-11-18 MED ORDER — ONDANSETRON HCL 4 MG PO TABS: 4.0000 mg | ORAL_TABLET | Freq: Four times a day (QID) | ORAL | Status: DC | PRN

## 2019-11-18 MED ORDER — ACETAMINOPHEN 325 MG PO TABS
650.0000 mg | ORAL_TABLET | Freq: Four times a day (QID) | ORAL | Status: DC | PRN
  Administered 2019-11-19 – 2019-11-22 (×6): 650 mg via ORAL
  Filled 2019-11-18 (×6): qty 2

## 2019-11-18 MED ORDER — SODIUM CHLORIDE 0.9 % IV SOLN
500.0000 mg | Freq: Once | INTRAVENOUS | Status: AC
  Administered 2019-11-18: 500 mg via INTRAVENOUS
  Filled 2019-11-18: qty 500

## 2019-11-18 MED ORDER — SODIUM CHLORIDE 0.9% FLUSH: 3.0000 mL | Freq: Once | INTRAVENOUS | Status: DC

## 2019-11-18 MED ORDER — AMLODIPINE BESYLATE 5 MG PO TABS
5.0000 mg | ORAL_TABLET | Freq: Every day | ORAL | Status: DC
  Administered 2019-11-18 – 2019-11-21 (×4): 5 mg via ORAL
  Filled 2019-11-18 (×4): qty 1

## 2019-11-18 MED ORDER — ACETAMINOPHEN 500 MG PO TABS
1000.0000 mg | ORAL_TABLET | Freq: Once | ORAL | Status: AC
  Administered 2019-11-18: 1000 mg via ORAL
  Filled 2019-11-18: qty 2

## 2019-11-18 MED ORDER — RIVAROXABAN 20 MG PO TABS
20.0000 mg | ORAL_TABLET | Freq: Every day | ORAL | Status: DC
  Administered 2019-11-19 – 2019-11-21 (×3): 20 mg via ORAL
  Filled 2019-11-18 (×4): qty 1

## 2019-11-18 MED ORDER — HYDROMORPHONE HCL 1 MG/ML IJ SOLN: 0.5000 mg | INTRAMUSCULAR | Status: DC | PRN

## 2019-11-18 MED ORDER — ONDANSETRON HCL 4 MG/2ML IJ SOLN: 4.0000 mg | Freq: Four times a day (QID) | INTRAMUSCULAR | Status: DC | PRN

## 2019-11-18 MED ORDER — SODIUM CHLORIDE 0.9 % IV SOLN
1.0000 g | INTRAVENOUS | Status: DC
  Administered 2019-11-19 – 2019-11-21 (×3): 1 g via INTRAVENOUS
  Filled 2019-11-18: qty 10
  Filled 2019-11-18 (×2): qty 1
  Filled 2019-11-18: qty 0.05

## 2019-11-18 MED ORDER — ZOLPIDEM TARTRATE 5 MG PO TABS
10.0000 mg | ORAL_TABLET | Freq: Every evening | ORAL | Status: DC | PRN
  Administered 2019-11-20 – 2019-11-21 (×2): 10 mg via ORAL
  Filled 2019-11-18 (×2): qty 2

## 2019-11-18 MED ORDER — ACETAMINOPHEN 650 MG RE SUPP: 650.0000 mg | Freq: Four times a day (QID) | RECTAL | Status: DC | PRN

## 2019-11-18 MED ORDER — SODIUM CHLORIDE 0.9 % IV SOLN: INTRAVENOUS | Status: DC

## 2019-11-18 MED ORDER — SODIUM CHLORIDE 0.9 % IV SOLN
1.0000 g | Freq: Once | INTRAVENOUS | Status: AC
  Administered 2019-11-18: 1 g via INTRAVENOUS
  Filled 2019-11-18: qty 10

## 2019-11-18 NOTE — ED Notes (Signed)
Pt ambulated in the room with a steady gait and pulse ox. Pt's saturation remained between 95-100%.

## 2019-11-18 NOTE — Progress Notes (Signed)
Pt arrived to 2w36. Alert and oriented x 4. Identified appropriately. VS stable, denied chest pain, no signs of acute distress. Pt ambulated to bathroom with supervision, study gait.  IV fluids infusing NS @ 75 ml/hr. Cardiac monitor in place and CCMD notified.  Pt oriented to room and equipments, instructed to use call bell for assistance and call bell left within pt reach. Will continue to monitor and treat pt per MD orders.

## 2019-11-18 NOTE — ED Triage Notes (Signed)
Pt states he had R ankle surgery in February and has had ongoing problems with infections at surgical site.  C/o R foot swelling and drainage from surgical site R lateral ankle.  Reports nausea and chills last night.

## 2019-11-18 NOTE — H&P (Signed)
History and Physical    JOHNMICHAEL Hopkins CVE:[id number] DOB: 1936-03-25 DOA: 11/18/2019  PCP: [name], MD  Patient coming from: Home  I have personally briefly reviewed patient's old medical records in [place]  Chief Complaint: "I have infection in my body"  HPI: Brendan Hopkins is a 84 y.o. male with medical history significant of right ankle trimalleolar fracture status post ORIF on 07/09/2019, hypertension, GERD, insomnia, obesity, history of right lower extremity DVT-on Xarelto presents to emergency department due to fever and chills.  Patient tells me since he underwent ORIF in February 2021 he has been experiencing intermittent greenish drainage from ORIF sites.  He was seen by his PCP 2 weeks ago due to worsening drainage and bilateral lower extremity edema and was prescribed Bactrim for 10 days and Lasix every other day respectively.  Patient denies severe pain in right ankle, redness, active drainage.  Reports exertional shortness of breath and nausea since about 2 weeks, fever, chills and myalgias started last night.  Denies cough, congestion, headache, blurry vision, chest pain, orthopnea, PND, vomiting, diarrhea, abdominal pain, generalized weakness or lethargy.  Reports that he has increased urinary frequency due to Lasix and HCTZ and noticed blood sometimes in his urine as he takes Xarelto.  He lives with his granddaughter.  Uses walker for ambulation.  No history of smoking, alcohol, listed drug use.  ED Course: Upon arrival to ED: Patient had fever of 101.9, tachycardic, tachypneic with ambulation, leukocytosis of 11.8, lactic acid: WNL, CMP shows AKI, sodium of 133, UA, blood culture, COVID-19 pending.  X-ray of right ankle: Negative for acute findings.  Chest x-ray shows Findings may represent chronic interstitial lung disease with superimposed atelectasis or infiltrates not excluded.  EDP consulted orthopedic surgery recommended work-up for other source of  infection.  Patient started on IV Rocephin and azithromycin.  [facility] hospitalist consulted for admission for sepsis.  Review of Systems: As per HPI otherwise negative.    Past Medical History:  Diagnosis Date  . Acute deep vein thrombosis (DVT) of tibial vein of right lower extremity (HCC)    after ankle fracture/surgery (2/21)  . Arthritis    RIGHT HIP, HANDS, BACK  . Cancer [facility])    Skin cancer   . Cataract   . Cervical radiculopathy    severe C3-4 neuroforaminal stenosis, C5-6 left synovial cyst compressing left nerve root.  . Colon polyps    2005  . COPD (chronic obstructive pulmonary disease) ([employer])    SMOKED FOR 94 YRS  . Diverticulosis    2005  . Fatty liver   . GERD (gastroesophageal reflux disease)   . Hypertension   . Pain    RIGHT HIP AND BACK  . Personal history of colonic polyps-adenoma and sessile serrated polyp 07/15/2010  . RBBB    POSS HX AF IN PAST- PAC'S  . Shortness of breath    WITH EXERTION ONLY  . Stroke ([place])    FEW YEARS AGO - PT SAYS "HEAT STROKE" X 2 - BUT NO PHYSICAL DEFICITS  . TIA (transient ischemic attack)     Past Surgical History:  Procedure Laterality Date  . COLONOSCOPY     maybe 3   . COLONOSCOPY W/ POLYPECTOMY    . ORIF ANKLE FRACTURE Right 07/11/2019  . ORIF ANKLE FRACTURE Right 07/11/2019   Procedure: OPEN REDUCTION INTERNAL FIXATION (ORIF) ANKLE FRACTURE;  Surgeon: Shona Needles, MD;  Location: [place];  Service: Orthopedics;  Laterality: Right;  . TOTAL  HIP ARTHROPLASTY Right 01/09/2013   Procedure: RIGHT TOTAL HIP ARTHROPLASTY ANTERIOR APPROACH;  Surgeon: [name], MD;  Location: [facility];  Service: Orthopedics;  Laterality: Right;     reports that he quit smoking about 12 years ago. His smoking use included cigarettes. He quit after 57.00 years of use. He has never used smokeless tobacco. He reports that he does not drink alcohol and does not use drugs.  No Known Allergies  Family History  Problem Relation Age of Onset    . Cancer Father   . Cancer Mother        died more of old age at age 30  . Colon cancer Neg Hx   . Esophageal cancer Neg Hx   . Prostate cancer Neg Hx   . Rectal cancer Neg Hx   . Stomach cancer Neg Hx     Prior to Admission medications   Medication Sig Start Date End Date Taking? Authorizing Provider  acetaminophen (TYLENOL) 650 MG CR tablet Take 650 mg by mouth every 8 (eight) hours as needed for pain.   Yes [provider]  amLODipine (NORVASC) 5 MG tablet Take 1 tablet (5 mg total) by mouth daily. Patient taking differently: Take 5 mg by mouth at bedtime.  03/20/19  Yes [name], MD  fish oil-omega-3 fatty acids 1000 MG capsule Take 1 g by mouth daily.   Yes [provider]  furosemide (LASIX) 20 MG tablet Take 1 tablet (20 mg total) by mouth every other day. 11/07/19  Yes Bates, Crystal A, FNP  hydrochlorothiazide (HYDRODIURIL) 25 MG tablet Take 1 tablet (25 mg total) by mouth daily. 10/30/18  Yes [name], MD  meclizine (ANTIVERT) 25 MG tablet TAKE ONE TABLET BY MOUTH THREE TIMES DAILY AS NEEDED FOR DIZZINESS Patient taking differently: Take 25 mg by mouth 3 (three) times daily as needed for dizziness. TAKE ONE TABLET BY MOUTH THREE TIMES DAILY AS NEEDED FOR DIZZINESS 10/11/19  Yes [name], MD  Multiple Vitamin (MULTIVITAMIN WITH MINERALS) TABS Take 1 tablet by mouth daily.   Yes [provider]  pantoprazole (PROTONIX) 40 MG tablet Take 1 tablet (40 mg total) by mouth daily. 11/07/18  Yes [name], MD  Peppermint Oil (IBGARD PO) Take 2 capsules by mouth See admin instructions. Take 2 capsules in the morning, at lunch, late after noon and bed time and in middle of the night for a total of 10 tablets   Yes [provider]  rivaroxaban (XARELTO) 20 MG TABS tablet Take 1 tablet (20 mg total) by mouth daily with supper. 09/04/19  Yes [name], MD  sulfamethoxazole-trimethoprim (BACTRIM DS) 800-160 MG tablet Take 1  tablet by mouth 2 (two) times daily. 11/06/19  Yes Bates, Crystal A, FNP  zolpidem (AMBIEN) 10 MG tablet Take 1 tablet (10 mg total) by mouth at bedtime as needed for sleep. 10/12/19 11/18/19 Yes Pickard, Cammie Mcgee, MD  HYDROcodone-acetaminophen (NORCO/VICODIN) 5-325 MG tablet Take 1 tablet by mouth every 6 (six) hours as needed for severe pain. Patient not taking: Reported on 11/18/2019 07/13/19   Delray Alt, PA-C  Rivaroxaban 15 & 20 MG TBPK Follow package directions: Take one 15mg  tablet by mouth twice a day. On day 22, switch to one 20mg  tablet once a day. Take with food. Patient not taking: Reported on 11/18/2019 08/03/19   [name], MD  solifenacin (VESICARE) 10 MG tablet Take 1 tablet (10 mg total) by mouth daily. Patient  not taking: Reported on 11/18/2019 09/07/19   [name], MD    Physical Exam: Vitals:   11/18/19 1414 11/18/19 1415 11/18/19 1537 11/18/19 1616  BP:  140/85  (!) 141/83  Pulse:  82 (!) 108 (!) 103  Resp:  17    Temp: 98.8 F (37.1 C)  (!) 101.9 F (38.8 C)   TempSrc: Oral  Rectal   SpO2:  91% 96% 93%  Weight:      Height:        Constitutional: NAD, calm, comfortable, communicating well, obese, on room air Eyes: PERRL, lids and conjunctivae normal ENMT: Mucous membranes are moist. Posterior pharynx clear of any exudate or lesions.Normal dentition.  Neck: normal, supple, no masses, no thyromegaly Respiratory: clear to auscultation bilaterally, no wheezing, no crackles. Normal respiratory effort. No accessory muscle use.  Cardiovascular: Regular rate and rhythm, no murmurs / rubs / gallops.  1+ bilateral pitting edema positive. 2+ pedal pulses. No carotid bruits.  Abdomen: no tenderness, no masses palpated. No hepatosplenomegaly. Bowel sounds positive.  Musculoskeletal:      Neurologic: CN 2-12 grossly intact. Sensation intact, DTR normal. Strength 5/5 in all 4.  Psychiatric: Normal judgment and insight. Alert and oriented x 3. Normal mood.     Labs on Admission: I have personally reviewed following labs and imaging studies  CBC: Recent Labs  Lab 11/18/19 0932  WBC 11.8*  NEUTROABS 9.9*  HGB 13.7  HCT 41.9  MCV 93.7  PLT 742   Basic Metabolic Panel: Recent Labs  Lab 11/18/19 0932  NA 133*  K 3.8  CL 99  CO2 22  GLUCOSE 142*  BUN 20  CREATININE 1.36*  CALCIUM 9.0   GFR: Estimated Creatinine Clearance: 52.5 mL/min (A) (by C-G formula based on SCr of 1.36 mg/dL (H)). Liver Function Tests: Recent Labs  Lab 11/18/19 0932  AST 21  ALT 17  ALKPHOS 60  BILITOT 1.1  PROT 7.0  ALBUMIN 3.6   No results for input(s): LIPASE, AMYLASE in the last 168 hours. No results for input(s): AMMONIA in the last 168 hours. Coagulation Profile: No results for input(s): INR, PROTIME in the last 168 hours. Cardiac Enzymes: No results for input(s): CKTOTAL, CKMB, CKMBINDEX, TROPONINI in the last 168 hours. BNP (last 3 results) No results for input(s): PROBNP in the last 8760 hours. HbA1C: No results for input(s): HGBA1C in the last 72 hours. CBG: No results for input(s): GLUCAP in the last 168 hours. Lipid Profile: No results for input(s): CHOL, HDL, LDLCALC, TRIG, CHOLHDL, LDLDIRECT in the last 72 hours. Thyroid Function Tests: No results for input(s): TSH, T4TOTAL, FREET4, T3FREE, THYROIDAB in the last 72 hours. Anemia Panel: No results for input(s): VITAMINB12, FOLATE, FERRITIN, TIBC, IRON, RETICCTPCT in the last 72 hours. Urine analysis:    Component Value Date/Time   COLORURINE YELLOW 11/18/2019 1625   APPEARANCEUR HAZY (A) 11/18/2019 1625   LABSPEC 1.023 11/18/2019 1625   PHURINE 6.0 11/18/2019 1625   GLUCOSEU NEGATIVE 11/18/2019 1625   HGBUR LARGE (A) 11/18/2019 1625   BILIRUBINUR NEGATIVE 11/18/2019 1625   KETONESUR NEGATIVE 11/18/2019 1625   PROTEINUR 30 (A) 11/18/2019 1625   UROBILINOGEN 0.2 01/01/2013 1028   NITRITE NEGATIVE 11/18/2019 1625   LEUKOCYTESUR NEGATIVE 11/18/2019 1625    Radiological  Exams on Admission: DG Chest 2 View  Result Date: 11/18/2019 CLINICAL DATA:  Chills. EXAM: CHEST - 2 VIEW COMPARISON:  April 12, 2019 FINDINGS: There are persistent prominent interstitial lung markings with hazy opacities at the  lung bases bilaterally. This is similar to prior studies. There is no pneumothorax. No large pleural effusion. There is stable mild elevation of the left hemidiaphragm. There are degenerative changes throughout the visualized thoracic spine. IMPRESSION: Prominent interstitial lung markings with hazy opacities at the lung bases bilaterally, similar to prior studies. Findings may represent chronic interstitial lung disease with superimposed atelectasis or infiltrates not excluded. Electronically Signed   By: Constance Holster M.D.   On: 11/18/2019 14:57   DG Ankle Complete Right  Result Date: 11/18/2019 CLINICAL DATA:  Pain. EXAM: RIGHT ANKLE - COMPLETE 3+ VIEW COMPARISON:  07/11/2019 FINDINGS: The patient's ankle ORIF hardware is intact. There is no new acute displaced fracture. There is evidence for interval healing of the previously demonstrated fractures. There is persistent surrounding soft tissue swelling with an ankle joint effusion. There is a small plantar calcaneal spur. IMPRESSION: 1. No acute displaced fracture. 2. Interval healing of the previously demonstrated fractures. Electronically Signed   By: Constance Holster M.D.   On: 11/18/2019 14:55   Assessment/Plan Principal Problem:   SIRS (systemic inflammatory response syndrome) (HCC) Active Problems:   HTN (hypertension)   HLD (hyperlipidemia)   Closed right ankle fracture   Acute deep vein thrombosis (DVT) of tibial vein of right lower extremity (HCC)   COPD (chronic obstructive pulmonary disease) (HCC)   Status post ORIF of fracture of ankle   AKI (acute kidney injury) ([place])   Hematuria   Insomnia   GERD (gastroesophageal reflux disease)   Sepsis: -CAP vs right ankle hardware? -Patient presented  with fever of 101.9, tachycardic, tachypnea & hypoxic in 90s with ambulation, leukocytosis of 11.8.  Lactic acid: WNL.  - Reviewed x-ray of right ankle and chest x-ray.  No active drainage or tenderness noted on right ankle. Taking Bactrim DS BID at home. -Patient received IV Rocephin and azithromycin in ED.  -Blood culture, COVID-19 pending, currently patient is on room air. -Admit patient on the floor.  On continuous pulse ox -Check procalcitonin, urine strep antigen, urine Legionella antigen. -Continue Rocephin and azithromycin. -Monitor vitals closely.  Albuterol as needed  History of right ankle fracture: Status post ORIF in February 2021 -EDP consulted orthopedic surgery.  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  No DVT in left leg.  Hypertension: Stable -Continue amlodipine, hold HCTZ due to AKI -Monitor blood pressure closely  AKI: -Hold Lasix, HCTZ for now monitor kidney function closely.  Repeat CMP tomorrow a.m.  Bilateral lower extremity edema: On Lasix every other day -Hold for now due to AKI.  Resume Lasix once kidney function back to baseline.  GERD: Continue PPI  Insomnia: Continue Ambien  Hematuria: -Patient reports intermittent hematuria due to Xarelto.  UA is positive for more than 50 RBC -Currently H&H is stable.  Continue to monitor. -Change/hold Gwenyth Bouillon?  DVT prophylaxis: Xarellto/SCD Code Status: Full code-confirmed with the patient Family Communication: None present at bedside.  Plan of care discussed with patient in length and he verbalized understanding and agreed with it. Disposition Plan: home in 2 days Consults called: Ortho by EDP  Admission status: Inpatient  Mckinley Jewel MD [facility]  If 7PM-7AM, please contact night-coverage [email or web address] Password [facility]  11/18/2019,  5:24 PM

## 2019-11-18 NOTE — ED Provider Notes (Signed)
[place] [facility] EMERGENCY DEPARTMENT Provider Note   CSN: [id number] Arrival date & time: 11/18/19  5035     History Chief Complaint  Patient presents with  . ankle infection    TAMIM SKOG is a 84 y.o. male with history of COPD, hypertension, GERD, CVA/TIA presents for evaluation of acute onset, intermittent chills since last night.  He reports that he underwent ankle surgery with Dr. Doreatha Martin in February 2021.  Since then he has experienced intermittent infections involving this area and states that he has been on 3 different courses of antibiotics.  He subsequently did develop a DVT to the right lower extremity after surgery and is currently anticoagulated on Xarelto.  Around 2 weeks ago he developed worsening drainage from the right ankle and went to see his PCP who cultured the wound and started him on Bactrim.  He reports that he has completed a 10-day course and started another course as of 2 days ago.  He has also been experiencing bilateral lower extremity edema, right worse than left.  His PCP started him on Lasix every other day and reports that his edema has improved significantly since that time.  He does note increased urinary frequency as a result of the medication but otherwise denies urinary symptoms.  Late last night after dinner he reports developing myalgias and subjective fevers and chills.  He was also nauseated and took an Alka-Seltzer with improvement.  He thought he may have eaten something that upset his stomach.  Denies vomiting, abdominal pain, diarrhea.  No chest pain.  He called a family member who recommended presentation to the ED with concern that "the infection has spread to my bloodstream".  He does note some shortness of breath but reports that this is baseline for him and he attributes it to physical deconditioning after contracting Covid November of last year.  He is fully vaccinated against Covid.  He reports that overall his pain has  improved markedly.  He is still draining from his ankle wound but reports that this has improved.  He still feels that the ankle is experiencing infection.  The history is provided by the patient and medical records.       Past Medical History:  Diagnosis Date  . Acute deep vein thrombosis (DVT) of tibial vein of right lower extremity (HCC)    after ankle fracture/surgery (2/21)  . Arthritis    RIGHT HIP, HANDS, BACK  . Cancer [facility])    Skin cancer   . Cataract   . Cervical radiculopathy    severe C3-4 neuroforaminal stenosis, C5-6 left synovial cyst compressing left nerve root.  . Colon polyps    2005  . COPD (chronic obstructive pulmonary disease) ([place])    SMOKED FOR 66 YRS  . Diverticulosis    2005  . Fatty liver   . GERD (gastroesophageal reflux disease)   . Hypertension   . Pain    RIGHT HIP AND BACK  . Personal history of colonic polyps-adenoma and sessile serrated polyp 07/15/2010  . RBBB    POSS HX AF IN PAST- PAC'S  . Shortness of breath    WITH EXERTION ONLY  . Stroke ([place])    FEW YEARS AGO - PT SAYS "HEAT STROKE" X 2 - BUT NO PHYSICAL DEFICITS  . TIA (transient ischemic attack)     Patient Active Problem List   Diagnosis Date Noted  . SIRS (systemic inflammatory response syndrome) ([place]) 11/18/2019  . COPD (chronic obstructive pulmonary disease) ([place])   .  Status post ORIF of fracture of ankle   . AKI (acute kidney injury) ([place])   . Hematuria   . Insomnia   . GERD (gastroesophageal reflux disease)   . Acute deep vein thrombosis (DVT) of tibial vein of right lower extremity ([place])   . Ankle syndesmosis disruption, right, initial encounter 07/16/2019  . Closed right ankle fracture 07/11/2019  . Closed displaced trimalleolar fracture of right lower leg 07/09/2019  . Obese 01/10/2013  . S/P right THA, AA 01/09/2013  . Dyspnea 10/20/2012  . Preop cardiovascular exam 10/20/2012  . Bruit 10/20/2012  . HLD (hyperlipidemia) 12/02/2011  . Impaired glucose  tolerance 12/02/2011  . TIA (transient ischemic attack) 12/01/2011  . H/O atrial fibrillation without current medication 12/01/2011  . HTN (hypertension) 12/01/2011  . Arthritis 12/01/2011  . Personal history of colonic polyps-adenoma and sessile serrated polyp 07/15/2010    Past Surgical History:  Procedure Laterality Date  . COLONOSCOPY     maybe 3   . COLONOSCOPY W/ POLYPECTOMY    . ORIF ANKLE FRACTURE Right 07/11/2019  . ORIF ANKLE FRACTURE Right 07/11/2019   Procedure: OPEN REDUCTION INTERNAL FIXATION (ORIF) ANKLE FRACTURE;  Surgeon: Shona Needles, MD;  Location: [facility];  Service: Orthopedics;  Laterality: Right;  . TOTAL HIP ARTHROPLASTY Right 01/09/2013   Procedure: RIGHT TOTAL HIP ARTHROPLASTY ANTERIOR APPROACH;  Surgeon: [name], MD;  Location: [facility];  Service: Orthopedics;  Laterality: Right;       Family History  Problem Relation Age of Onset  . Cancer Father   . Cancer Mother        died more of old age at age 45  . Colon cancer Neg Hx   . Esophageal cancer Neg Hx   . Prostate cancer Neg Hx   . Rectal cancer Neg Hx   . Stomach cancer Neg Hx     Social History   Tobacco Use  . Smoking status: Former Smoker    Years: 57.00    Types: Cigarettes    Quit date: 05/25/2007    Years since quitting: 12.4  . Smokeless tobacco: Never Used  Vaping Use  . Vaping Use: Never used  Substance Use Topics  . Alcohol use: No    Alcohol/week: 0.0 standard drinks  . Drug use: No    Home Medications Prior to Admission medications   Medication Sig Start Date End Date Taking? Authorizing Provider  acetaminophen (TYLENOL) 650 MG CR tablet Take 650 mg by mouth every 8 (eight) hours as needed for pain.   Yes [provider]  amLODipine (NORVASC) 5 MG tablet Take 1 tablet (5 mg total) by mouth daily. Patient taking differently: Take 5 mg by mouth at bedtime.  03/20/19  Yes [name], MD  fish oil-omega-3 fatty acids 1000 MG capsule Take 1 g by mouth  daily.   Yes [provider]  furosemide (LASIX) 20 MG tablet Take 1 tablet (20 mg total) by mouth every other day. 11/07/19  Yes Bates, Crystal A, FNP  hydrochlorothiazide (HYDRODIURIL) 25 MG tablet Take 1 tablet (25 mg total) by mouth daily. 10/30/18  Yes [name], MD  meclizine (ANTIVERT) 25 MG tablet TAKE ONE TABLET BY MOUTH THREE TIMES DAILY AS NEEDED FOR DIZZINESS Patient taking differently: Take 25 mg by mouth 3 (three) times daily as needed for dizziness. TAKE ONE TABLET BY MOUTH THREE TIMES DAILY AS NEEDED FOR DIZZINESS 10/11/19  Yes [name], MD  Multiple Vitamin (MULTIVITAMIN WITH MINERALS) TABS  Take 1 tablet by mouth daily.   Yes [provider]  pantoprazole (PROTONIX) 40 MG tablet Take 1 tablet (40 mg total) by mouth daily. 11/07/18  Yes [name], MD  Peppermint Oil (IBGARD PO) Take 2 capsules by mouth See admin instructions. Take 2 capsules in the morning, at lunch, late after noon and bed time and in middle of the night for a total of 10 tablets   Yes [provider]  rivaroxaban (XARELTO) 20 MG TABS tablet Take 1 tablet (20 mg total) by mouth daily with supper. 09/04/19  Yes [name], MD  sulfamethoxazole-trimethoprim (BACTRIM DS) 800-160 MG tablet Take 1 tablet by mouth 2 (two) times daily. 11/06/19  Yes Bates, Crystal A, FNP  zolpidem (AMBIEN) 10 MG tablet Take 1 tablet (10 mg total) by mouth at bedtime as needed for sleep. 10/12/19 11/18/19 Yes Pickard, Cammie Mcgee, MD  HYDROcodone-acetaminophen (NORCO/VICODIN) 5-325 MG tablet Take 1 tablet by mouth every 6 (six) hours as needed for severe pain. Patient not taking: Reported on 11/18/2019 07/13/19   Delray Alt, PA-C  Rivaroxaban 15 & 20 MG TBPK Follow package directions: Take one 15mg  tablet by mouth twice a day. On day 22, switch to one 20mg  tablet once a day. Take with food. Patient not taking: Reported on 11/18/2019 08/03/19   [name], MD  solifenacin (VESICARE) 10 MG  tablet Take 1 tablet (10 mg total) by mouth daily. Patient not taking: Reported on 11/18/2019 09/07/19   [name], MD    Allergies    Patient has no known allergies.  Review of Systems   Review of Systems  Constitutional: Positive for chills and fever.  Respiratory: Positive for shortness of breath (chronic, unchanged).   Cardiovascular: Positive for leg swelling. Negative for chest pain.  Gastrointestinal: Positive for nausea. Negative for abdominal pain, diarrhea and vomiting.  Musculoskeletal: Positive for arthralgias.  Neurological: Negative for syncope.  All other systems reviewed and are negative.   Physical Exam Updated Vital Signs BP 113/66   Pulse 81   Temp (!) 101.9 F (38.8 C) (Rectal)   Resp (!) 27   Ht 5' 10.5" (1.791 m)   Wt 106.6 kg   SpO2 91%   BMI 33.24 kg/m   Physical Exam Vitals and nursing note reviewed.  Constitutional:      General: He is not in acute distress.    Appearance: He is well-developed. He is obese.  HENT:     Head: Normocephalic and atraumatic.  Eyes:     General:        Right eye: No discharge.        Left eye: No discharge.     Conjunctiva/sclera: Conjunctivae normal.  Neck:     Vascular: No JVD.     Trachea: No tracheal deviation.  Cardiovascular:     Rate and Rhythm: Normal rate and regular rhythm.     Comments: 2+ DP/PT pulses bilaterally.  Bilateral lower extremity edema, right worse than left.  Patient reports this is improved over the last couple of weeks ever since starting Lasix. Pulmonary:     Effort: Pulmonary effort is normal.  Abdominal:     General: Bowel sounds are normal. There is no distension.     Palpations: Abdomen is soft.  Musculoskeletal:        General: Tenderness present.     Cervical back: Neck supple.     Right lower leg: Edema present.     Left lower  leg: Edema present.     Comments: Diffuse soreness on palpation of the right ankle which patient reports is chronic and improving.  He has  scabbed wounds over the medial and lateral malleoli.  No surrounding erythema or induration.  No active drainage.  5/5 strength of BLE major muscle groups.  Skin:    General: Skin is warm and dry.     Findings: No erythema.  Neurological:     Mental Status: He is alert.     Comments: Sensation intact to light touch of bilateral lower extremities  Psychiatric:        Behavior: Behavior normal.     ED Results / Procedures / Treatments   Labs (all labs ordered are listed, but only abnormal results are displayed) Labs Reviewed  COMPREHENSIVE METABOLIC PANEL - Abnormal; Notable for the following components:      Result Value   Sodium 133 (*)    Glucose, Bld 142 (*)    Creatinine, Ser 1.36 (*)    GFR calc non Af Amer 48 (*)    GFR calc Af Amer 56 (*)    All other components within normal limits  CBC WITH DIFFERENTIAL/PLATELET - Abnormal; Notable for the following components:   WBC 11.8 (*)    Neutro Abs 9.9 (*)    All other components within normal limits  URINALYSIS, ROUTINE W REFLEX MICROSCOPIC - Abnormal; Notable for the following components:   APPearance HAZY (*)    Hgb urine dipstick LARGE (*)    Protein, ur 30 (*)    RBC / HPF >50 (*)    All other components within normal limits  CULTURE, BLOOD (ROUTINE X 2)  CULTURE, BLOOD (ROUTINE X 2)  SARS CORONAVIRUS 2 BY RT PCR (HOSPITAL ORDER, [facility])  LACTIC ACID, PLASMA  STREP PNEUMONIAE URINARY ANTIGEN  PROCALCITONIN  MAGNESIUM  PHOSPHORUS  CBC  COMPREHENSIVE METABOLIC PANEL  LEGIONELLA PNEUMOPHILA SEROGP 1 UR AG    EKG None  Radiology DG Chest 2 View  Result Date: 11/18/2019 CLINICAL DATA:  Chills. EXAM: CHEST - 2 VIEW COMPARISON:  April 12, 2019 FINDINGS: There are persistent prominent interstitial lung markings with hazy opacities at the lung bases bilaterally. This is similar to prior studies. There is no pneumothorax. No large pleural effusion. There is stable mild elevation of the  left hemidiaphragm. There are degenerative changes throughout the visualized thoracic spine. IMPRESSION: Prominent interstitial lung markings with hazy opacities at the lung bases bilaterally, similar to prior studies. Findings may represent chronic interstitial lung disease with superimposed atelectasis or infiltrates not excluded. Electronically Signed   By: Constance Holster M.D.   On: 11/18/2019 14:57   DG Ankle Complete Right  Result Date: 11/18/2019 CLINICAL DATA:  Pain. EXAM: RIGHT ANKLE - COMPLETE 3+ VIEW COMPARISON:  07/11/2019 FINDINGS: The patient's ankle ORIF hardware is intact. There is no new acute displaced fracture. There is evidence for interval healing of the previously demonstrated fractures. There is persistent surrounding soft tissue swelling with an ankle joint effusion. There is a small plantar calcaneal spur. IMPRESSION: 1. No acute displaced fracture. 2. Interval healing of the previously demonstrated fractures. Electronically Signed   By: Constance Holster M.D.   On: 11/18/2019 14:55    Procedures Procedures (including critical care time)  Medications Ordered in ED Medications  sodium chloride flush (NS) 0.9 % injection 3 mL (3 mLs Intravenous Not Given 11/18/19 1157)  cefTRIAXone (ROCEPHIN) 1 g in sodium chloride 0.9 % 100 mL  IVPB (1 g Intravenous New Bag/Given 11/18/19 1734)  azithromycin (ZITHROMAX) 500 mg in sodium chloride 0.9 % 250 mL IVPB (has no administration in time range)  0.9 %  sodium chloride infusion ( Intravenous New Bag/Given 11/18/19 1733)  acetaminophen (TYLENOL) tablet 650 mg (has no administration in time range)    Or  acetaminophen (TYLENOL) suppository 650 mg (has no administration in time range)  oxyCODONE (Oxy IR/ROXICODONE) immediate release tablet 5 mg (has no administration in time range)  HYDROmorphone (DILAUDID) injection 0.5-1 mg (has no administration in time range)  ondansetron (ZOFRAN) tablet 4 mg (has no administration in time range)     Or  ondansetron (ZOFRAN) injection 4 mg (has no administration in time range)  amLODipine (NORVASC) tablet 5 mg (has no administration in time range)  zolpidem (AMBIEN) tablet 10 mg (has no administration in time range)  pantoprazole (PROTONIX) EC tablet 40 mg (40 mg Oral Given 11/18/19 1724)  rivaroxaban (XARELTO) tablet 20 mg (has no administration in time range)  cefTRIAXone (ROCEPHIN) 1 g in sodium chloride 0.9 % 100 mL IVPB (has no administration in time range)  azithromycin (ZITHROMAX) 500 mg in sodium chloride 0.9 % 250 mL IVPB (has no administration in time range)  acetaminophen (TYLENOL) tablet 1,000 mg (1,000 mg Oral Given 11/18/19 1625)    ED Course  I have reviewed the triage vital signs and the nursing notes.  Pertinent labs & imaging results that were available during my care of the patient were reviewed by me and considered in my medical decision making (see chart for details).    MDM Rules/Calculators/A&P                          FABIO WAH was evaluated in Emergency Department on 11/18/2019 for the symptoms described in the history of present illness. He was evaluated in the context of the global COVID-19 pandemic, which necessitated consideration that the patient might be at risk for infection with the SARS-CoV-2 virus that causes COVID-19. Institutional protocols and algorithms that pertain to the evaluation of patients at risk for COVID-19 are in a state of rapid change based on information released by regulatory bodies including the CDC and federal and state organizations. These policies and algorithms were followed during the patient's care in the ED.  Patient presenting for evaluation of fevers and chills since last night.  Has been experiencing ongoing shortness of breath particularly dyspnea on exertion as well as recurrent skin infections to the right ankle.  Status post right ORIF in February with Dr. Doreatha Martin.  Has been on Bactrim for the last 12 days and prior to  that has been on antibiotics intermittently.  On my assessment he is febrile to 101.9 F rectally, tachypneic, tachycardic intermittently.  SPO2 saturations dropped down to the low 90s with movement.  He appears markedly winded with ambulation.  With regards to lower extremity physical examination he has bilateral lower extremity edema, right worse than left.  He does report this is improved after starting Lasix every other day.  His work-up is significant for leukocytosis of 11.8, slight elevation in creatinine but BUN is within normal limits.  He is mildly hyponatremic as well.  UA shows hematuria which he reports he has noticed since starting Xarelto.  He notes urinary frequency as a result of starting Lasix but otherwise no urinary symptoms.  I do not suspect this is a source of infection for him at this time.  His  chest x-ray shows findings concerning for possible pneumonia versus interstitial lung disease with superimposed atelectasis.  His ankle x-ray shows no evidence of fracture, hardware failure or osteomyelitis.  CONSULT: Spoke with Dr. Doreatha Martin, patient's orthopedist.  He reviewed the patient's films today.  He does not feel that the fever is related to the patient's ankle wounds/hardware today.  He does not recommend any advanced imaging today.  He has discussed the possibility of hardware removal with the patient previously.  He recommends searching for potential other sources of infection to explain the patient's fever and will see the patient while admitted to the hospital, likely tomorrow morning.  Given the patient's increased work of breathing, worsening saturations with ambulation I suspect that his fever could be due to a respiratory source.  He is vaccinated against Covid and had Covid in November so I have a low suspicion that his chest x-ray is due to Covid infection.  Will start on Rocephin and azithromycin for management of what appears to be community-acquired pneumonia.  Spoke  with Dr. Doristine Bosworth with [facility] hospitalist service who agrees to some care of patient and bring him into the hospital for further evaluation and management.  Patient was seen and evaluated by Dr. Maryan Rued who agrees with assessment and plan at this time.   Final Clinical Impression(s) / ED Diagnoses Final diagnoses:  SIRS (systemic inflammatory response syndrome) (HCC)  Multifocal pneumonia  Pain and swelling of right ankle    Rx / DC Orders ED Discharge Orders    None       Renita Papa, PA-C 11/18/19 1753    Blanchie Dessert, MD 11/23/19 [phone]

## 2019-11-18 NOTE — ED Notes (Signed)
RN not on the floor

## 2019-11-18 NOTE — ED Notes (Signed)
Patient transported to X-ray 

## 2019-11-18 NOTE — Progress Notes (Signed)
Ortho Trauma Note  Patient is well known to me from ORIF of right ankle in Feb of this year. He has had ongoing intermittent drainage from his lateral incision that has been treated with outpatient oral antibiotics. Patient had DVT postop for which he has been on anticoagulation. Patient now with fever and rigors. Ankle x-rays have no sign of osteomyelitis and appear stable from clinic x-rays. I have very low suspicion that this is coming from his ankle, as this has been a chronic problem. Would recommend workup for other sources of infection. I will evaluate patient in hospital likely tomorrow AM.  Shona Needles, MD [facility] [phone] (office) [email or web address]

## 2019-11-18 NOTE — Consult Note (Signed)
Orthopaedic Trauma Service (OTS) Consult   Patient ID: Brendan Hopkins MRN: [id number] DOB/AGE: 82-Feb-1937 82 y.o.  Reason for Consult: Infection, unknown source Referring Physician: Rodell Perna, PA-C [facility] [name]  HPI: Brendan Hopkins is an 84 y.o. male being seen at the request of [facility] EDP for evaluation of right ankle as possible source of infection. Patient is well known to orthopaedic trauma service from ORIF of right ankle with Dr. Doreatha Martin in February 2021. He has had ongoing intermittent drainage from his lateral incision that has been treated with outpatient oral antibiotics. He subsequently did develop a DVT to the right lower extremity after surgery and is currently anticoagulated on Xarelto.  Around 2 weeks ago he developed worsening drainage from the right ankle and went to see his PCP who cultured the wound and started him on Bactrim.  He reports that he has completed a 10-day course and started another course as of 2 days ago.  Late last night patient reports developing myalgias, subjective fevers and chills, and nausea.  Denies vomiting, abdominal pain, diarrhea.  Presented to emergency department this morning with concern that "the infection has spread to my bloodstream".  Noted to have leukocytosis on initial blood draw. Orthopaedic trauma service was consulted for evaluation of the right ankle as possible source of current infection.  Patient seen this evening in the emergency room. He reports that overall his ankle pain has improved markedly.  noted to have drainage last Monday but has not noted any over the past couple days. Redness previous noted around lateral incision has now resolved. Denies issue with medial incision.  Is hopeful that he doesn't have to have hardware removed.   Past Medical History:  Diagnosis Date  . Acute deep vein thrombosis (DVT) of tibial vein of right lower extremity (HCC)    after ankle fracture/surgery (2/21)  . Arthritis    RIGHT HIP,  HANDS, BACK  . Cancer [facility])    Skin cancer   . Cataract   . Cervical radiculopathy    severe C3-4 neuroforaminal stenosis, C5-6 left synovial cyst compressing left nerve root.  . Colon polyps    2005  . COPD (chronic obstructive pulmonary disease) ([place])    SMOKED FOR 66 YRS  . Diverticulosis    2005  . Fatty liver   . GERD (gastroesophageal reflux disease)   . Hypertension   . Pain    RIGHT HIP AND BACK  . Personal history of colonic polyps-adenoma and sessile serrated polyp 07/15/2010  . RBBB    POSS HX AF IN PAST- PAC'S  . Shortness of breath    WITH EXERTION ONLY  . Stroke ([place])    FEW YEARS AGO - PT SAYS "HEAT STROKE" X 2 - BUT NO PHYSICAL DEFICITS  . TIA (transient ischemic attack)     Past Surgical History:  Procedure Laterality Date  . COLONOSCOPY     maybe 3   . COLONOSCOPY W/ POLYPECTOMY    . ORIF ANKLE FRACTURE Right 07/11/2019  . ORIF ANKLE FRACTURE Right 07/11/2019   Procedure: OPEN REDUCTION INTERNAL FIXATION (ORIF) ANKLE FRACTURE;  Surgeon: Shona Needles, MD;  Location: [facility];  Service: Orthopedics;  Laterality: Right;  . TOTAL HIP ARTHROPLASTY Right 01/09/2013   Procedure: RIGHT TOTAL HIP ARTHROPLASTY ANTERIOR APPROACH;  Surgeon: [name], MD;  Location: [facility];  Service: Orthopedics;  Laterality: Right;    Family History  Problem Relation Age of Onset  . Cancer Father   .  Cancer Mother        died more of old age at age 41  . Colon cancer Neg Hx   . Esophageal cancer Neg Hx   . Prostate cancer Neg Hx   . Rectal cancer Neg Hx   . Stomach cancer Neg Hx     Social History:  reports that he quit smoking about 12 years ago. His smoking use included cigarettes. He quit after 57.00 years of use. He has never used smokeless tobacco. He reports that he does not drink alcohol and does not use drugs.  Allergies: No Known Allergies  Medications:  Prior to Admission:  Medications Prior to Admission  Medication Sig Dispense Refill Last Dose  .  acetaminophen (TYLENOL) 650 MG CR tablet Take 650 mg by mouth every 8 (eight) hours as needed for pain.   11/18/2019 at Unknown time  . amLODipine (NORVASC) 5 MG tablet Take 1 tablet (5 mg total) by mouth daily. (Patient taking differently: Take 5 mg by mouth at bedtime. ) 90 tablet 3 11/17/2019 at Unknown time  . fish oil-omega-3 fatty acids 1000 MG capsule Take 1 g by mouth daily.   11/17/2019 at Unknown time  . furosemide (LASIX) 20 MG tablet Take 1 tablet (20 mg total) by mouth every other day. 30 tablet 0 11/17/2019  . hydrochlorothiazide (HYDRODIURIL) 25 MG tablet Take 1 tablet (25 mg total) by mouth daily. 90 tablet 3 11/17/2019 at Unknown time  . meclizine (ANTIVERT) 25 MG tablet TAKE ONE TABLET BY MOUTH THREE TIMES DAILY AS NEEDED FOR DIZZINESS (Patient taking differently: Take 25 mg by mouth 3 (three) times daily as needed for dizziness. TAKE ONE TABLET BY MOUTH THREE TIMES DAILY AS NEEDED FOR DIZZINESS) 30 tablet 2 unk  . Multiple Vitamin (MULTIVITAMIN WITH MINERALS) TABS Take 1 tablet by mouth daily.   11/17/2019 at Unknown time  . pantoprazole (PROTONIX) 40 MG tablet Take 1 tablet (40 mg total) by mouth daily. 90 tablet 3 11/17/2019 at Unknown time  . Peppermint Oil (IBGARD PO) Take 2 capsules by mouth See admin instructions. Take 2 capsules in the morning, at lunch, late after noon and bed time and in middle of the night for a total of 10 tablets   11/17/2019 at Unknown time  . rivaroxaban (XARELTO) 20 MG TABS tablet Take 1 tablet (20 mg total) by mouth daily with supper. 30 tablet 4 11/17/2019 at 0900  . sulfamethoxazole-trimethoprim (BACTRIM DS) 800-160 MG tablet Take 1 tablet by mouth 2 (two) times daily. 14 tablet 7 11/17/2019 at Unknown time  . zolpidem (AMBIEN) 10 MG tablet Take 1 tablet (10 mg total) by mouth at bedtime as needed for sleep. 15 tablet 1 11/17/2019 at Unknown time  . HYDROcodone-acetaminophen (NORCO/VICODIN) 5-325 MG tablet Take 1 tablet by mouth every 6 (six) hours as needed for  severe pain. (Patient not taking: Reported on 11/18/2019) 28 tablet 0 Not Taking at Unknown time  . Rivaroxaban 15 & 20 MG TBPK Follow package directions: Take one 15mg  tablet by mouth twice a day. On day 22, switch to one 20mg  tablet once a day. Take with food. (Patient not taking: Reported on 11/18/2019) 51 each 0 Not Taking at Unknown time  . solifenacin (VESICARE) 10 MG tablet Take 1 tablet (10 mg total) by mouth daily. (Patient not taking: Reported on 11/18/2019) 30 tablet 1 Not Taking at Unknown time    ROS: Constitutional: +hx fever, chills Vision: No changes in vision ENT: No difficulty swallowing CV: No chest  pain Pulm: +hx of SOB  GI: No nausea or vomiting GU: No urgency or inability to hold urine Skin:+eschar right lateral ankle incision Neurologic: No numbness or tingling Psychiatric: No depression or anxiety Heme: No bruising Allergic: No reaction to medications or food   Exam: Blood pressure (!) 147/76, pulse 84, temperature 98 F (36.7 C), temperature source Oral, resp. rate 15, height 5' 10.5" (1.791 m), weight 106.6 kg, SpO2 96 %. General: Laying in bed, NAD. Orientation: Alert and oriented x 3 Mood and Affect: Mood and affect appropriate, pleasant and cooperative Gait: Not assessed Coordination and balance: Within normal limits  Right Lower Extremity: Lateral ankle incisions with stable eschar. No active drainage. No surrounding erythema. Edema noted to lower leg, right worse than left. No significant tenderness with palpation about the ankle. No significant increased warmth to ankle when compared to contralateral side. Sensation intact to light touch distally. + DP pulse  Left Lower Extremity: Skin without lesions. Lower leg edema, less significant than right. No tenderness to palpation. Full painless ROM. Sensation and motor function intact distally. Neurovascularly intact   Medical Decision Making: Data: Imaging: AP, lateral, oblique views of right ankle show  previous ORIF. No signs of hardware failure or loosening. Fracture healed. No signs of osteomyelitis. Ankle effusion present  Labs:  Results for orders placed or performed during the hospital encounter of 11/18/19 (from the past 24 hour(s))  Lactic acid, plasma     Status: None   Collection Time: 11/18/19  9:32 AM  Result Value Ref Range   Lactic Acid, Venous 1.4 0.5 - 1.9 mmol/L  Comprehensive metabolic panel     Status: Abnormal   Collection Time: 11/18/19  9:32 AM  Result Value Ref Range   Sodium 133 (L) 135 - 145 mmol/L   Potassium 3.8 3.5 - 5.1 mmol/L   Chloride 99 98 - 111 mmol/L   CO2 22 22 - 32 mmol/L   Glucose, Bld 142 (H) 70 - 99 mg/dL   BUN 20 8 - 23 mg/dL   Creatinine, Ser 1.36 (H) 0.61 - 1.24 mg/dL   Calcium 9.0 8.9 - 10.3 mg/dL   Total Protein 7.0 6.5 - 8.1 g/dL   Albumin 3.6 3.5 - 5.0 g/dL   AST 21 15 - 41 U/L   ALT 17 0 - 44 U/L   Alkaline Phosphatase 60 38 - 126 U/L   Total Bilirubin 1.1 0.3 - 1.2 mg/dL   GFR calc non Af Amer 48 (L) >60 mL/min   GFR calc Af Amer 56 (L) >60 mL/min   Anion gap 12 5 - 15  CBC with Differential     Status: Abnormal   Collection Time: 11/18/19  9:32 AM  Result Value Ref Range   WBC 11.8 (H) 4.0 - 10.5 K/uL   RBC 4.47 4.22 - 5.81 MIL/uL   Hemoglobin 13.7 13.0 - 17.0 g/dL   HCT 41.9 39 - 52 %   MCV 93.7 80.0 - 100.0 fL   MCH 30.6 26.0 - 34.0 pg   MCHC 32.7 30.0 - 36.0 g/dL   RDW 13.1 11.5 - 15.5 %   Platelets 229 150 - 400 K/uL   nRBC 0.0 0.0 - 0.2 %   Neutrophils Relative % 85 %   Neutro Abs 9.9 (H) 1.7 - 7.7 K/uL   Lymphocytes Relative 8 %   Lymphs Abs 1.0 0.7 - 4.0 K/uL   Monocytes Relative 6 %   Monocytes Absolute 0.8 0 - 1 K/uL   Eosinophils  Relative 0 %   Eosinophils Absolute 0.0 0 - 0 K/uL   Basophils Relative 1 %   Basophils Absolute 0.1 0 - 0 K/uL   Immature Granulocytes 0 %   Abs Immature Granulocytes 0.03 0.00 - 0.07 K/uL  Culture, blood (routine x 2)     Status: None (Preliminary result)   Collection Time:  11/18/19 12:35 PM   Specimen: BLOOD  Result Value Ref Range   Specimen Description BLOOD LEFT ANTECUBITAL    Special Requests      BOTTLES DRAWN AEROBIC AND ANAEROBIC Blood Culture results may not be optimal due to an excessive volume of blood received in culture bottles   Culture      NO GROWTH < 12 HOURS Performed at [facility] [address], [place],  [address]    Report Status PENDING   Urinalysis, Routine w reflex microscopic     Status: Abnormal   Collection Time: 11/18/19  4:25 PM  Result Value Ref Range   Color, Urine YELLOW YELLOW   APPearance HAZY (A) CLEAR   Specific Gravity, Urine 1.023 1.005 - 1.030   pH 6.0 5.0 - 8.0   Glucose, UA NEGATIVE NEGATIVE mg/dL   Hgb urine dipstick LARGE (A) NEGATIVE   Bilirubin Urine NEGATIVE NEGATIVE   Ketones, ur NEGATIVE NEGATIVE mg/dL   Protein, ur 30 (A) NEGATIVE mg/dL   Nitrite NEGATIVE NEGATIVE   Leukocytes,Ua NEGATIVE NEGATIVE   RBC / HPF >50 (H) 0 - 5 RBC/hpf   Bacteria, UA NONE SEEN NONE SEEN   Squamous Epithelial / LPF 0-5 0 - 5   Mucus PRESENT   Strep pneumoniae urinary antigen     Status: None   Collection Time: 11/18/19  5:09 PM  Result Value Ref Range   Strep Pneumo Urinary Antigen NEGATIVE NEGATIVE  SARS Coronavirus 2 by RT PCR (hospital order, performed in [place] hospital lab) Nasopharyngeal Nasopharyngeal Swab     Status: None   Collection Time: 11/18/19  5:19 PM   Specimen: Nasopharyngeal Swab  Result Value Ref Range   SARS Coronavirus 2 NEGATIVE NEGATIVE  Procalcitonin - Baseline     Status: None   Collection Time: 11/18/19  5:31 PM  Result Value Ref Range   Procalcitonin 0.12 ng/mL  Magnesium     Status: None   Collection Time: 11/18/19  5:31 PM  Result Value Ref Range   Magnesium 1.9 1.7 - 2.4 mg/dL  Phosphorus     Status: None   Collection Time: 11/18/19  5:31 PM  Result Value Ref Range   Phosphorus 3.0 2.5 - 4.6 mg/dL    Assessment/Plan: 84 year old male with s/p ORIF right  ankle 06/2019  Imaging of the right ankle done in emergency department show no signs of osteomyelitis and appear stable form clinic x-rays. Ankle wound with stable eschar and no active drainage or surrounding redness on clinical exam. Currently have low suspicion that infection is coming from his ankle, as this has been a chronic problem. Would recommend workup for other sources of infection. If no other source of infection found, would recommending advanced imaging of the ankle.  Shyah Cadmus A. Carmie [facility] [phone] (office) [email or web address]

## 2019-11-19 ENCOUNTER — Inpatient Hospital Stay (HOSPITAL_COMMUNITY): Payer: Medicare Other

## 2019-11-19 DIAGNOSIS — I82441 Acute embolism and thrombosis of right tibial vein: Secondary | ICD-10-CM

## 2019-11-19 DIAGNOSIS — Z9889 Other specified postprocedural states: Secondary | ICD-10-CM

## 2019-11-19 DIAGNOSIS — R310 Gross hematuria: Secondary | ICD-10-CM

## 2019-11-19 DIAGNOSIS — Z8781 Personal history of (healed) traumatic fracture: Secondary | ICD-10-CM

## 2019-11-19 DIAGNOSIS — S82891G Other fracture of right lower leg, subsequent encounter for closed fracture with delayed healing: Secondary | ICD-10-CM

## 2019-11-19 DIAGNOSIS — I10 Essential (primary) hypertension: Secondary | ICD-10-CM

## 2019-11-19 DIAGNOSIS — N179 Acute kidney failure, unspecified: Secondary | ICD-10-CM

## 2019-11-19 DIAGNOSIS — E7800 Pure hypercholesterolemia, unspecified: Secondary | ICD-10-CM

## 2019-11-19 DIAGNOSIS — K219 Gastro-esophageal reflux disease without esophagitis: Secondary | ICD-10-CM

## 2019-11-19 DIAGNOSIS — J449 Chronic obstructive pulmonary disease, unspecified: Secondary | ICD-10-CM

## 2019-11-19 LAB — CBC
HCT: 37.5 % — ABNORMAL LOW (ref 39.0–52.0)
Hemoglobin: 12.5 g/dL — ABNORMAL LOW (ref 13.0–17.0)
MCH: 31.5 pg (ref 26.0–34.0)
MCHC: 33.3 g/dL (ref 30.0–36.0)
MCV: 94.5 fL (ref 80.0–100.0)
Platelets: 204 10*3/uL (ref 150–400)
RBC: 3.97 MIL/uL — ABNORMAL LOW (ref 4.22–5.81)
RDW: 13.2 % (ref 11.5–15.5)
WBC: 9.1 10*3/uL (ref 4.0–10.5)
nRBC: 0 % (ref 0.0–0.2)

## 2019-11-19 LAB — COMPREHENSIVE METABOLIC PANEL
ALT: 14 U/L (ref 0–44)
AST: 18 U/L (ref 15–41)
Albumin: 3.2 g/dL — ABNORMAL LOW (ref 3.5–5.0)
Alkaline Phosphatase: 50 U/L (ref 38–126)
Anion gap: 10 (ref 5–15)
BUN: 18 mg/dL (ref 8–23)
CO2: 22 mmol/L (ref 22–32)
Calcium: 8.5 mg/dL — ABNORMAL LOW (ref 8.9–10.3)
Chloride: 102 mmol/L (ref 98–111)
Creatinine, Ser: 1.16 mg/dL (ref 0.61–1.24)
GFR calc Af Amer: >60 mL/min (ref >60)
GFR calc non Af Amer: 59 mL/min — ABNORMAL LOW (ref >60)
Glucose, Bld: 116 mg/dL — ABNORMAL HIGH (ref 70–99)
Potassium: 3.8 mmol/L (ref 3.5–5.1)
Sodium: 134 mmol/L — ABNORMAL LOW (ref 135–145)
Total Bilirubin: 0.6 mg/dL (ref 0.3–1.2)
Total Protein: 6.3 g/dL — ABNORMAL LOW (ref 6.5–8.1)

## 2019-11-19 NOTE — Progress Notes (Signed)
PROGRESS NOTE    Brendan Hopkins  BJY:[id number]  DOB: Dec 13, 1935  PCP: [name], MD Admit date:11/18/2019 Chief compliant: fever/chills 84 y.o. male with medical history significant of right ankle trimalleolar fracture status post ORIF on 07/09/2019, hypertension, GERD, insomnia, obesity, history of right lower extremity DVT-on Xarelto presents to emergency department due to fever and chills associated with intermittent greenish drainage from ORIF site since he underwent sx.He was seen by his PCP 2 weeks ago due to worsening drainage and bilateral lower extremity edema and was prescribed Bactrim for 10 days and Lasix every other day respectively.  Reports that he has increased urinary frequency due to Lasix and HCTZ and noticed blood sometimes in his urine as he takes Xarelto. ED Course: Patient had fever of 101.9, tachycardic, tachypneic with ambulation, leukocytosis of 11.8, lactic acid: WNL, CMP shows AKI, sodium of 133, UA, blood culture, COVID-19 pending.  X-ray of right ankle: Negative for acute findings.  Chest x-ray shows Findings may represent chronic interstitial lung disease with superimposed atelectasis or infiltrates not excluded.  EDP consulted orthopedic surgery recommended work-up for other source of infection.  Patient started on IV Rocephin and azithromycin. Hospital course: Patient admitted to [facility] for further mx of sepsis with ortho consultation.  Subjective:  Patient appears to be in good spirits.  He believes the source of infection is his foot.  He does report intermittent gross hematuria over the last few weeks.  He related it to being on a blood thinner.  He states sometimes it is pink urine and sometimes gross blood upon urination.  He does report history of kidney stones which he had passed spontaneously but denies any significant associated pain during these episodes..  Objective: Vitals:   11/18/19 2037 11/18/19 2144 11/19/19 0200 11/19/19 0804  BP:  (!)  149/41 135/85 (!) 142/72  Pulse:  75 81 85  Resp:  16 18 20   Temp: 98 F (36.7 C) 98 F (36.7 C) 98.3 F (36.8 C) 98.3 F (36.8 C)  TempSrc: Oral Oral Oral Oral  SpO2:  97% 98% 94%  Weight:      Height:        Intake/Output Summary (Last 24 hours) at 11/19/2019 0939 Last data filed at 11/19/2019 0865 Gross per 24 hour  Intake 100 ml  Output 1000 ml  Net -900 ml   Filed Weights   11/18/19 0914  Weight: 106.6 kg    Physical Examination:. General: Moderately built, no acute distress noted Head ENT: Atraumatic normocephalic, PERRLA, neck supple Heart: S1-S2 heard, regular rate and rhythm, no murmurs.  No leg edema noted Lungs: Equal air entry bilaterally, no rhonchi or rales on exam, no accessory muscle use Abdomen: Obese, bowel sounds heard, soft, nontender, nondistended. No organomegaly.  No CVA tenderness Extremities: Does have a small scab along lateral aspect of right ankle, no active drainage currently.  He does have some surrounding edema/puffiness but no fluctuant swelling appreciated. Neurological: Awake alert oriented x3, no focal weakness or numbness, strength and sensations to crude touch intact Skin: No wounds or rashes.   Data Reviewed: I have personally reviewed following labs and imaging studies  CBC: Recent Labs  Lab 11/18/19 0932 11/19/19 0333  WBC 11.8* 9.1  NEUTROABS 9.9*  --   HGB 13.7 12.5*  HCT 41.9 37.5*  MCV 93.7 94.5  PLT 229 784   Basic Metabolic Panel: Recent Labs  Lab 11/18/19 0932 11/18/19 1731 11/19/19 0333  NA 133*  --  134*  K  3.8  --  3.8  CL 99  --  102  CO2 22  --  22  GLUCOSE 142*  --  116*  BUN 20  --  18  CREATININE 1.36*  --  1.16  CALCIUM 9.0  --  8.5*  MG  --  1.9  --   PHOS  --  3.0  --    GFR: Estimated Creatinine Clearance: 61.6 mL/min (by C-G formula based on SCr of 1.16 mg/dL). Liver Function Tests: Recent Labs  Lab 11/18/19 0932 11/19/19 0333  AST 21 18  ALT 17 14  ALKPHOS 60 50  BILITOT 1.1 0.6    PROT 7.0 6.3*  ALBUMIN 3.6 3.2*   No results for input(s): LIPASE, AMYLASE in the last 168 hours. No results for input(s): AMMONIA in the last 168 hours. Coagulation Profile: No results for input(s): INR, PROTIME in the last 168 hours. Cardiac Enzymes: No results for input(s): CKTOTAL, CKMB, CKMBINDEX, TROPONINI in the last 168 hours. BNP (last 3 results) No results for input(s): PROBNP in the last 8760 hours. HbA1C: No results for input(s): HGBA1C in the last 72 hours. CBG: No results for input(s): GLUCAP in the last 168 hours. Lipid Profile: No results for input(s): CHOL, HDL, LDLCALC, TRIG, CHOLHDL, LDLDIRECT in the last 72 hours. Thyroid Function Tests: No results for input(s): TSH, T4TOTAL, FREET4, T3FREE, THYROIDAB in the last 72 hours. Anemia Panel: No results for input(s): VITAMINB12, FOLATE, FERRITIN, TIBC, IRON, RETICCTPCT in the last 72 hours. Sepsis Labs: Recent Labs  Lab 11/18/19 0932 11/18/19 1731  PROCALCITON  --  0.12  LATICACIDVEN 1.4  --     Recent Results (from the past 240 hour(s))  Culture, blood (routine x 2)     Status: None (Preliminary result)   Collection Time: 11/18/19 12:35 PM   Specimen: BLOOD  Result Value Ref Range Status   Specimen Description BLOOD LEFT ANTECUBITAL  Final   Special Requests   Final    BOTTLES DRAWN AEROBIC AND ANAEROBIC Blood Culture results may not be optimal due to an excessive volume of blood received in culture bottles   Culture   Final    NO GROWTH < 12 HOURS Performed at [facility] [address] [place], [place] [address]    Report Status PENDING  Incomplete  SARS Coronavirus 2 by RT PCR (hospital order, performed in [facility] hospital lab) Nasopharyngeal Nasopharyngeal Swab     Status: None   Collection Time: 11/18/19  5:19 PM   Specimen: Nasopharyngeal Swab  Result Value Ref Range Status   SARS Coronavirus 2 NEGATIVE NEGATIVE Final    Comment: (NOTE) SARS-CoV-2 target nucleic acids are NOT  DETECTED.  The SARS-CoV-2 RNA is generally detectable in upper and lower respiratory specimens during the acute phase of infection. The lowest concentration of SARS-CoV-2 viral copies this assay can detect is 250 copies / mL. A negative result does not preclude SARS-CoV-2 infection and should not be used as the sole basis for treatment or other patient management decisions.  A negative result may occur with improper specimen collection / handling, submission of specimen other than nasopharyngeal swab, presence of viral mutation(s) within the areas targeted by this assay, and inadequate number of viral copies (<250 copies / mL). A negative result must be combined with clinical observations, patient history, and epidemiological information.  Fact Sheet for Patients:   StrictlyIdeas.no  Fact Sheet for Healthcare Providers: BankingDealers.co.za  This test is not yet approved or  cleared by  the [employer] and has been authorized for detection and/or diagnosis of SARS-CoV-2 by FDA under an Emergency Use Authorization (EUA).  This EUA will remain in effect (meaning this test can be used) for the duration of the COVID-19 declaration under Section 564(b)(1) of the Act, 21 U.S.C. section 360bbb-3(b)(1), unless the authorization is terminated or revoked sooner.  Performed at [facility], [place] [address], [place], [place] [address]       Radiology Studies: DG Chest 2 View  Result Date: 11/18/2019 CLINICAL DATA:  Chills. EXAM: CHEST - 2 VIEW COMPARISON:  April 12, 2019 FINDINGS: There are persistent prominent interstitial lung markings with hazy opacities at the lung bases bilaterally. This is similar to prior studies. There is no pneumothorax. No large pleural effusion. There is stable mild elevation of the left hemidiaphragm. There are degenerative changes throughout the visualized thoracic spine. IMPRESSION: Prominent  interstitial lung markings with hazy opacities at the lung bases bilaterally, similar to prior studies. Findings may represent chronic interstitial lung disease with superimposed atelectasis or infiltrates not excluded. Electronically Signed   By: Constance Holster M.D.   On: 11/18/2019 14:57   DG Ankle Complete Right  Result Date: 11/18/2019 CLINICAL DATA:  Pain. EXAM: RIGHT ANKLE - COMPLETE 3+ VIEW COMPARISON:  07/11/2019 FINDINGS: The patient's ankle ORIF hardware is intact. There is no new acute displaced fracture. There is evidence for interval healing of the previously demonstrated fractures. There is persistent surrounding soft tissue swelling with an ankle joint effusion. There is a small plantar calcaneal spur. IMPRESSION: 1. No acute displaced fracture. 2. Interval healing of the previously demonstrated fractures. Electronically Signed   By: Constance Holster M.D.   On: 11/18/2019 14:55      Scheduled Meds: . amLODipine  5 mg Oral QHS  . pantoprazole  40 mg Oral Daily  . rivaroxaban  20 mg Oral Q supper  . sodium chloride flush  3 mL Intravenous Once   Continuous Infusions: . sodium chloride 75 mL/hr at 11/19/19 0922  . azithromycin    . cefTRIAXone (ROCEPHIN)  IV       Assessment/Plan:  Sepsis POA. Admitted with empiric abx for CAP vs right ankle hardware infection.Patient presented with fever of 101.9, tachycardic, tachypnea & hypoxic in 90s with ambulation, leukocytosis of 11.8.  Lactic acid: WNL. Ankle wound with stable eschar and no active drainage or surrounding redness on clinical exam,no signs of osteomyelitis and appear stable form clinic x-rays.Been taking Bactrim DS BID at home. U/A unremarkable except for hematuria. f/u Blood cultures. CXR with findings that may represent chronic interstitial lung disease with superimposed atelectasis or infiltrates, currently patient is on room air. COVID 19 -ve.Procalcitonin 0.12, urine strep antigen-ve, urine Legionella antigen pend.  Continue Rocephin and azithromycin.  Will obtain CT-renal to rule out nephrolithiasis/ureterolithiasis as possible source.  Patient does not have any renal colic currently but also denies pain with prior episodes.  History of right ankle fracture: Status post ORIF in February 2021-EDP consulted orthopedic surgery who are not concerned and recommended to look for alternate source of infection.  Mild AKI, mild hyponatremia:-Likely secondary to dehydration in the setting of diuretic use.  Creat at 1.36 on admission-- improved to 1.16 (close to baseline 0.9-1.0)Hold Lasix, HCTZ for now monitor kidney function closely.  Elevated creat could be related to bactrim use as well.  Follow-up CT renal as above.  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  No DVT in left leg.  Hematuria:-Patient reports intermittent hematuria due to Xarelto.  Hemoglobin somewhat downtrending but not very significant. UA is positive for more than 50 RBC.Currently H&H is stable. Will obtain CT renal to r/o stones/mass. May need urology evaluation. U/A with no bacteria, pyuria not mentioned. Check urine cx anyway.  Hypertension: Stable.Continue amlodipine, hold HCTZ due to AKI  Bilateral lower extremity edema: On Lasix every other day-Hold for now due to AKI.  Resume Lasix for PRN use once kidney function back to baseline.  GERD: Continue PPI  Insomnia: Continue Ambien      DVT prophylaxis: On Xarelto Code Status: Full code Family / Patient Communication:  Disposition Plan:  He lives with his granddaughter.  Uses walker for ambulation Status is: Inpatient  Remains inpatient appropriate because:IV treatments appropriate due to intensity of illness or inability to take PO   Dispo: The patient is from: Home              Anticipated d/c is to: Home              Anticipated  d/c date is: 2 days              Patient currently is not medically stable to d/c.            Time spent: 35 minutes     >50% time spent in discussions with care team and coordination of care.    Guilford Shi, MD [facility] Pager in [place]  If 7PM-7AM, please contact night-coverage [email or web address] 11/19/2019, 9:39 AM

## 2019-11-20 LAB — BASIC METABOLIC PANEL
Anion gap: 10 (ref 5–15)
BUN: 19 mg/dL (ref 8–23)
CO2: 20 mmol/L — ABNORMAL LOW (ref 22–32)
Calcium: 8.5 mg/dL — ABNORMAL LOW (ref 8.9–10.3)
Chloride: 105 mmol/L (ref 98–111)
Creatinine, Ser: 1.05 mg/dL (ref 0.61–1.24)
GFR calc Af Amer: >60 mL/min (ref >60)
GFR calc non Af Amer: >60 mL/min (ref >60)
Glucose, Bld: 120 mg/dL — ABNORMAL HIGH (ref 70–99)
Potassium: 3.9 mmol/L (ref 3.5–5.1)
Sodium: 135 mmol/L (ref 135–145)

## 2019-11-20 LAB — URINE CULTURE: Culture: NO GROWTH

## 2019-11-20 LAB — LEGIONELLA PNEUMOPHILA SEROGP 1 UR AG: L. pneumophila Serogp 1 Ur Ag: NEGATIVE

## 2019-11-20 NOTE — Treatment Plan (Addendum)
84 y.o. male with history of nephrolithiasis, DVT on xarelto, intermittent gross hematuria, recent right ankle fx s/p ORIF, currently admitted for sepsis. CT scan revealed bilateral renal stones, with 1cm left renal pelvis stone. Patient without flank pain. Hb stable at 15. Urine culture no growth from 6/27. Cr 1.05. Given low concern for urinary source of sepsis, and mild hematuria with stable hemoglobin, and asymptomatic, no indication for acute urologic intervention at this time. Patient will require completion of workup for gross hematuria as an outpatient, including cystoscopy, CT urogram. Will also plan to discuss treatment options for his renal stones.   -- Please place referral to [facility] on discharge for workup of gross hematuria and nephrolithiasis   Plan discussed in person with Dr. Earnest Conroy.

## 2019-11-20 NOTE — Plan of Care (Signed)

## 2019-11-20 NOTE — Progress Notes (Signed)
PROGRESS NOTE    Brendan Hopkins  FIE:[id number]  DOB: 06/17/1935  PCP: [name], MD Admit date:11/18/2019 Chief compliant: fever/chills 84 y.o. male with medical history significant of right ankle trimalleolar fracture status post ORIF on 07/09/2019, hypertension, GERD, insomnia, obesity, history of right lower extremity DVT-on Xarelto presents to emergency department due to fever and chills associated with intermittent greenish drainage from ORIF site since he underwent sx.He was seen by his PCP 2 weeks ago due to worsening drainage and bilateral lower extremity edema and was prescribed Bactrim for 10 days and Lasix every other day respectively.  Reports that he has increased urinary frequency due to Lasix and HCTZ and noticed blood sometimes in his urine as he takes Xarelto. ED Course: Patient had fever of 101.9, tachycardic, tachypneic with ambulation, leukocytosis of 11.8, lactic acid: WNL, CMP shows AKI, sodium of 133, UA, blood culture, COVID-19 pending.  X-ray of right ankle: Negative for acute findings.  Chest x-ray shows Findings may represent chronic interstitial lung disease with superimposed atelectasis or infiltrates not excluded.  EDP consulted orthopedic surgery recommended work-up for other source of infection.  Patient started on IV Rocephin and azithromycin. Hospital course: Patient admitted to [facility] for further mx of sepsis with ortho consultation. Patient also reports intermittent gross hematuria over the last few weeks.  He related it to being on a blood thinner.  He states sometimes it is pink urine and sometimes gross blood upon urination.  He does report history of kidney stones which he had passed spontaneously but denies any significant associated pain during these episodes.. Subjective:  Patient appears to be in good spirits.  He feels the source of infection is his foot.  No hematuria /flank pain while been here.   Objective: Vitals:   11/19/19 0804 11/19/19  1625 11/19/19 2057 11/20/19 0829  BP: (!) 142/72 (!) 145/82 (!) 141/81 (!) 149/76  Pulse: 85 72 71 81  Resp: 20 20 18 19   Temp: 98.3 F (36.8 C) 97.8 F (36.6 C) 98.9 F (37.2 C) 98.8 F (37.1 C)  TempSrc: Oral  Oral Oral  SpO2: 94% 99% 100% 95%  Weight:      Height:        Intake/Output Summary (Last 24 hours) at 11/20/2019 1457 Last data filed at 11/20/2019 0800 Gross per 24 hour  Intake 500 ml  Output 400 ml  Net 100 ml   Filed Weights   11/18/19 0914  Weight: 106.6 kg    Physical Examination:. General: Moderately built, no acute distress noted Head ENT: Atraumatic normocephalic, PERRLA, neck supple Heart: S1-S2 heard, regular rate and rhythm, no murmurs.  No leg edema noted Lungs: Equal air entry bilaterally, no rhonchi or rales on exam, no accessory muscle use Abdomen: Obese, bowel sounds heard, soft, nontender, nondistended. No organomegaly.  No CVA tenderness Extremities: Does have a small scab along lateral aspect of right ankle, no active drainage currently.  He does have some surrounding edema/puffiness but no fluctuant swelling appreciated. Neurological: Awake alert oriented x3, no focal weakness or numbness, strength and sensations to crude touch intact Skin: No wounds or rashes.   Data Reviewed: I have personally reviewed following labs and imaging studies  CBC: Recent Labs  Lab 11/18/19 0932 11/19/19 0333  WBC 11.8* 9.1  NEUTROABS 9.9*  --   HGB 13.7 12.5*  HCT 41.9 37.5*  MCV 93.7 94.5  PLT 229 166   Basic Metabolic Panel: Recent Labs  Lab 11/18/19 0932 11/18/19 1731 11/19/19 0333  11/20/19 1226  NA 133*  --  134* 135  K 3.8  --  3.8 3.9  CL 99  --  102 105  CO2 22  --  22 20*  GLUCOSE 142*  --  116* 120*  BUN 20  --  18 19  CREATININE 1.36*  --  1.16 1.05  CALCIUM 9.0  --  8.5* 8.5*  MG  --  1.9  --   --   PHOS  --  3.0  --   --    GFR: Estimated Creatinine Clearance: 68.1 mL/min (by C-G formula based on SCr of 1.05 mg/dL). Liver  Function Tests: Recent Labs  Lab 11/18/19 0932 11/19/19 0333  AST 21 18  ALT 17 14  ALKPHOS 60 50  BILITOT 1.1 0.6  PROT 7.0 6.3*  ALBUMIN 3.6 3.2*   No results for input(s): LIPASE, AMYLASE in the last 168 hours. No results for input(s): AMMONIA in the last 168 hours. Coagulation Profile: No results for input(s): INR, PROTIME in the last 168 hours. Cardiac Enzymes: No results for input(s): CKTOTAL, CKMB, CKMBINDEX, TROPONINI in the last 168 hours. BNP (last 3 results) No results for input(s): PROBNP in the last 8760 hours. HbA1C: No results for input(s): HGBA1C in the last 72 hours. CBG: No results for input(s): GLUCAP in the last 168 hours. Lipid Profile: No results for input(s): CHOL, HDL, LDLCALC, TRIG, CHOLHDL, LDLDIRECT in the last 72 hours. Thyroid Function Tests: No results for input(s): TSH, T4TOTAL, FREET4, T3FREE, THYROIDAB in the last 72 hours. Anemia Panel: No results for input(s): VITAMINB12, FOLATE, FERRITIN, TIBC, IRON, RETICCTPCT in the last 72 hours. Sepsis Labs: Recent Labs  Lab 11/18/19 0932 11/18/19 1731  PROCALCITON  --  0.12  LATICACIDVEN 1.4  --     Recent Results (from the past 240 hour(s))  Culture, blood (routine x 2)     Status: None (Preliminary result)   Collection Time: 11/18/19 12:35 PM   Specimen: BLOOD  Result Value Ref Range Status   Specimen Description BLOOD LEFT ANTECUBITAL  Final   Special Requests   Final    BOTTLES DRAWN AEROBIC AND ANAEROBIC Blood Culture results may not be optimal due to an excessive volume of blood received in culture bottles   Culture   Final    NO GROWTH 2 DAYS Performed at [facility] [address] [place], [place] [address]    Report Status PENDING  Incomplete  Culture, blood (routine x 2)     Status: None (Preliminary result)   Collection Time: 11/18/19  3:03 PM   Specimen: BLOOD  Result Value Ref Range Status   Specimen Description BLOOD RIGHT ANTECUBITAL  Final   Special Requests    Final    BOTTLES DRAWN AEROBIC AND ANAEROBIC Blood Culture results may not be optimal due to an inadequate volume of blood received in culture bottles   Culture   Final    NO GROWTH 2 DAYS Performed at [facility], [place] [address] [place], [place] [address]    Report Status PENDING  Incomplete  SARS Coronavirus 2 by RT PCR (hospital order, performed in [facility] hospital lab) Nasopharyngeal Nasopharyngeal Swab     Status: None   Collection Time: 11/18/19  5:19 PM   Specimen: Nasopharyngeal Swab  Result Value Ref Range Status   SARS Coronavirus 2 NEGATIVE NEGATIVE Final    Comment: (NOTE) SARS-CoV-2 target nucleic acids are NOT DETECTED.  The SARS-CoV-2 RNA is generally detectable in  upper and lower respiratory specimens during the acute phase of infection. The lowest concentration of SARS-CoV-2 viral copies this assay can detect is 250 copies / mL. A negative result does not preclude SARS-CoV-2 infection and should not be used as the sole basis for treatment or other patient management decisions.  A negative result may occur with improper specimen collection / handling, submission of specimen other than nasopharyngeal swab, presence of viral mutation(s) within the areas targeted by this assay, and inadequate number of viral copies (<250 copies / mL). A negative result must be combined with clinical observations, patient history, and epidemiological information.  Fact Sheet for Patients:   StrictlyIdeas.no  Fact Sheet for Healthcare Providers: BankingDealers.co.za  This test is not yet approved or  cleared by the [place] FDA and has been authorized for detection and/or diagnosis of SARS-CoV-2 by FDA under an Emergency Use Authorization (EUA).  This EUA will remain in effect (meaning this test can be used) for the duration of the COVID-19 declaration under Section 564(b)(1) of the Act, 21 U.S.C. section 360bbb-3(b)(1),  unless the authorization is terminated or revoked sooner.  Performed at [facility], [place] [address], [place], [place] [address]   Culture, Urine     Status: None   Collection Time: 11/19/19 10:01 AM   Specimen: Urine, Random  Result Value Ref Range Status   Specimen Description URINE, RANDOM  Final   Special Requests NONE  Final   Culture   Final    NO GROWTH Performed at [facility], [place] [address], [place], [place] [address]    Report Status 11/20/2019 FINAL  Final      Radiology Studies: CT RENAL STONE STUDY  Result Date: 11/19/2019 CLINICAL DATA:  Hematuria EXAM: CT ABDOMEN AND PELVIS WITHOUT CONTRAST TECHNIQUE: Multidetector CT imaging of the abdomen and pelvis was performed following the standard protocol without IV contrast. COMPARISON:  05/31/2014 FINDINGS: Lower chest: Elevation of the left hemidiaphragm. Left base scarring or atelectasis. No acute abnormality. Hepatobiliary: No focal hepatic abnormality. Gallbladder unremarkable. Pancreas: No focal abnormality or ductal dilatation. Spleen: No focal abnormality.  Normal size. Adrenals/Urinary Tract: Multiple small left renal stones. There is a 10 mm left renal pelvic stone. No ureteral stones or hydronephrosis. Punctate stones in the midpole of the right kidney. Adrenal glands and urinary bladder unremarkable. Stomach/Bowel: Left colonic diverticulosis. Moderate stool burden. Stomach and small bowel decompressed, unremarkable. Vascular/Lymphatic: Aortic atherosclerosis. No enlarged abdominal or pelvic lymph nodes. Reproductive: No visible focal abnormality. Other: No free fluid or free air. Musculoskeletal: No acute bony abnormality. Diffuse degenerative changes throughout the lumbar spine. IMPRESSION: Bilateral nephrolithiasis, left greater than right. 10 mm left renal pelvic stone. No hydronephrosis. Moderate stool burden. Left colonic diverticulosis.  No active diverticulitis. Aortic atherosclerosis. Electronically  Signed   By: Rolm Baptise M.D.   On: 11/19/2019 22:37      Scheduled Meds: . amLODipine  5 mg Oral QHS  . pantoprazole  40 mg Oral Daily  . rivaroxaban  20 mg Oral Q supper  . sodium chloride flush  3 mL Intravenous Once   Continuous Infusions: . sodium chloride 75 mL/hr at 11/19/19 1813  . azithromycin 500 mg (11/19/19 2304)  . cefTRIAXone (ROCEPHIN)  IV 1 g (11/19/19 1901)     Assessment/Plan:  Sepsis POA. Admitted with empiric abx for CAP vs right ankle hardware infection.Patient presented with fever of 101.9, tachycardic, tachypnea & hypoxic in 90s with ambulation, leukocytosis of 11.8.  Lactic acid: WNL.  Ankle wound with stable eschar and no active drainage or surrounding redness on clinical exam,no signs of osteomyelitis and appear stable form clinic x-rays.Been taking Bactrim DS BID at home. U/A unremarkable except for hematuria. f/u Blood cultures. CXR with findings that may represent chronic interstitial lung disease with superimposed atelectasis or infiltrates, currently patient is on room air. COVID 19 -ve.Procalcitonin 0.12, urine strep antigen-ve, urine Legionella antigen pend. Continue Rocephin and azithromycin. CT-renal done and reveals a 91mm renal pelvis stone but patient asymptomatic and urine cx with no growth so far.   Patient does not have any renal colic currently, also denies pain with prior episodes.Per ortho recommendation , will obtain CT imaging to r/o underlying abscess as no other source identified (renal function better). Leucocytosis resolved  History of right ankle fracture: Status post ORIF in February 2021-EDP consulted orthopedic surgery who are not concerned and recommended to look for alternate source of infection. If none identified, further imaging of foot was recommended  Mild AKI, mild hyponatremia:-Likely secondary to dehydration in the setting of diuretic use.  Creat at 1.36 on admission-- improved to 1.16 (close to baseline 0.9-1.0)Hold Lasix, HCTZ  for now monitor kidney function closely after contrasted CT of LE.  Elevated creat could be related to bactrim use as well.CT renal as above, no hydronephrosis but did show renal pelvis stone.  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  No DVT in left leg.  Hematuria:-Patient reports intermittent hematuria due to Xarelto.  Hemoglobin somewhat downtrending but not very significant. UA is positive for more than 50 RBC.Currently H&H is stable. CT renal as above. U/A with no bacteria, pyuria not mentioned. Urine cx -ve so far. Consult Urology.  Hypertension: Stable.Continue amlodipine, hold HCTZ due to AKI  Bilateral lower extremity edema: On Lasix every other day-Hold for now due to AKI.  Resume Lasix for PRN use once kidney function back to baseline.  GERD: Continue PPI  Insomnia: Continue Ambien      DVT prophylaxis: On Xarelto Code Status: Full code Family / Patient Communication:  Disposition Plan:  He lives with his granddaughter.  Uses walker for ambulation Status is: Inpatient  Remains inpatient appropriate because:IV treatments appropriate due to intensity of illness or inability to take PO   Dispo: The patient is from: Home              Anticipated d/c is to: Home              Anticipated d/c date is: 1-2 days              Patient currently is not medically stable to d/c.            Time spent: 35 minutes     >50% time spent in discussions with care team and coordination of care.    Guilford Shi, MD [facility] Pager in [place]  If 7PM-7AM, please contact night-coverage [email or web address] 11/20/2019, 2:57 PM

## 2019-11-21 ENCOUNTER — Inpatient Hospital Stay (HOSPITAL_COMMUNITY): Payer: Medicare Other

## 2019-11-21 LAB — CBC
HCT: 35.7 % — ABNORMAL LOW (ref 39.0–52.0)
Hemoglobin: 11.7 g/dL — ABNORMAL LOW (ref 13.0–17.0)
MCH: 31 pg (ref 26.0–34.0)
MCHC: 32.8 g/dL (ref 30.0–36.0)
MCV: 94.7 fL (ref 80.0–100.0)
Platelets: 190 10*3/uL (ref 150–400)
RBC: 3.77 MIL/uL — ABNORMAL LOW (ref 4.22–5.81)
RDW: 12.9 % (ref 11.5–15.5)
WBC: 7.5 10*3/uL (ref 4.0–10.5)
nRBC: 0 % (ref 0.0–0.2)

## 2019-11-21 LAB — BASIC METABOLIC PANEL
Anion gap: 8 (ref 5–15)
BUN: 18 mg/dL (ref 8–23)
CO2: 21 mmol/L — ABNORMAL LOW (ref 22–32)
Calcium: 8.6 mg/dL — ABNORMAL LOW (ref 8.9–10.3)
Chloride: 106 mmol/L (ref 98–111)
Creatinine, Ser: 0.99 mg/dL (ref 0.61–1.24)
GFR calc Af Amer: >60 mL/min (ref >60)
GFR calc non Af Amer: >60 mL/min (ref >60)
Glucose, Bld: 140 mg/dL — ABNORMAL HIGH (ref 70–99)
Potassium: 3.9 mmol/L (ref 3.5–5.1)
Sodium: 135 mmol/L (ref 135–145)

## 2019-11-21 MED ORDER — SODIUM CHLORIDE 0.9 % IV SOLN: INTRAVENOUS | Status: AC

## 2019-11-21 MED ORDER — POLYETHYLENE GLYCOL 3350 17 G PO PACK
17.0000 g | PACK | Freq: Every day | ORAL | Status: DC
  Administered 2019-11-22: 17 g via ORAL
  Filled 2019-11-21: qty 1

## 2019-11-21 MED ORDER — IOHEXOL 300 MG/ML  SOLN
100.0000 mL | Freq: Once | INTRAMUSCULAR | Status: AC | PRN
  Administered 2019-11-21: 100 mL via INTRAVENOUS

## 2019-11-21 MED ORDER — ALBUTEROL SULFATE (2.5 MG/3ML) 0.083% IN NEBU: 3.0000 mL | INHALATION_SOLUTION | Freq: Four times a day (QID) | RESPIRATORY_TRACT | Status: DC | PRN

## 2019-11-21 MED ORDER — SENNA 8.6 MG PO TABS
1.0000 | ORAL_TABLET | Freq: Every day | ORAL | Status: DC
  Administered 2019-11-22: 8.6 mg via ORAL
  Filled 2019-11-21: qty 1

## 2019-11-21 NOTE — Progress Notes (Signed)
PROGRESS NOTE    Brendan Hopkins  VHQ:[id number] DOB: 01-09-1936 DOA: 11/18/2019 PCP: [name], MD   Brief Narrative:  Patient admitted to [facility] for further mx of sepsis with ortho consultation. Patient also reports intermittent gross hematuria over the last few weeks.  He related it to being on a blood thinner.  He states sometimes it is pink urine and sometimes gross blood upon urination.  He does report history of kidney stones which he had passed spontaneously but denies any significant associated pain during these episodes.  Assessment & Plan:   Principal Problem:   SIRS (systemic inflammatory response syndrome) (HCC) Active Problems:   HTN (hypertension)   HLD (hyperlipidemia)   Closed right ankle fracture   Acute deep vein thrombosis (DVT) of tibial vein of right lower extremity (HCC)   COPD (chronic obstructive pulmonary disease) (HCC)   Status post ORIF of fracture of ankle   AKI (acute kidney injury) ([place])   Hematuria   Insomnia   GERD (gastroesophageal reflux disease)   1-Sepsis POA; Secondary to PNA vs right ankle infection,  Chest x ray with chronic interstitial finding superimpose PNA.  CT ankle showed cellulitis, no abscess, no bone infection.  Continue with IV antibiotics.   2-History of right ankle fracture.  ORIF February 2021 [place] following Follow ortho recommendation in regards CT ankle result.   3-Mild AKI, Hyponatremia; improved with IV fluids.  4-History of right LE DVT;  Continue with xarelto 5-Hematuria;  CT;Bilateral nephrolithiasis, left greater than right. 10 mm left renal pelvic stone. No hydronephrosis Urology recommend out patient follow up LE edema; resume lasix tomorrow.   Estimated body mass index is 33.24 kg/m as calculated from the following:   Height as of this encounter: 5' 10.5" (1.791 m).   Weight as of this encounter: 106.6 kg.   DVT prophylaxis: Xarelto Code Status: full code Family Communication: Care  discussed with pateint Disposition Plan:  Status is: Inpatient  Remains inpatient appropriate because:IV treatments appropriate due to intensity of illness or inability to take PO   Dispo: The patient is from: Home              Anticipated d/c is to: Home              Anticipated d/c date is: 1 day              Patient currently is not medically stable to d/c.        Consultants:   Ortho  Procedures:   none  Antimicrobials:  Ceftriaxone and azithro  Subjective: Dyspnea on exertion. Denies worsening dyspnea at rest.  Denies ankle pain   Objective: Vitals:   11/20/19 1542 11/20/19 2159 11/21/19 0759 11/21/19 1558  BP: 131/76 (!) 144/81 127/87 126/77  Pulse: 71 77 91 75  Resp: 19 19 19 19   Temp: 97.8 F (36.6 C) 98.7 F (37.1 C) 98.5 F (36.9 C) 99.2 F (37.3 C)  TempSrc:  Oral    SpO2: 98% 98% 97% 98%  Weight:      Height:        Intake/Output Summary (Last 24 hours) at 11/21/2019 1953 Last data filed at 11/21/2019 1600 Gross per 24 hour  Intake 1422.5 ml  Output 600 ml  Net 822.5 ml   Filed Weights   11/18/19 0914  Weight: 106.6 kg    Examination:  General exam: Appears calm and comfortable  Respiratory system: Clear to auscultation. Respiratory effort normal. Cardiovascular system: S1 & S2 heard,  RRR. No JVD, murmurs, rubs, gallops or clicks. No pedal edema. Gastrointestinal system: Abdomen is nondistended, soft and nontender. No organomegaly or masses felt. Normal bowel sounds heard. Central nervous system: Alert and oriented. No focal neurological deficits. Extremities: Symmetric 5 x 5 power. Skin: No rashes, lesions or ulcers Psychiatry: Judgement and insight appear normal. Mood & affect appropriate.     Data Reviewed: I have personally reviewed following labs and imaging studies  CBC: Recent Labs  Lab 11/18/19 0932 11/19/19 0333 11/21/19 0220  WBC 11.8* 9.1 7.5  NEUTROABS 9.9*  --   --   HGB 13.7 12.5* 11.7*  HCT 41.9 37.5* 35.7*   MCV 93.7 94.5 94.7  PLT 229 204 841   Basic Metabolic Panel: Recent Labs  Lab 11/18/19 0932 11/18/19 1731 11/19/19 0333 11/20/19 1226 11/21/19 0220  NA 133*  --  134* 135 135  K 3.8  --  3.8 3.9 3.9  CL 99  --  102 105 106  CO2 22  --  22 20* 21*  GLUCOSE 142*  --  116* 120* 140*  BUN 20  --  18 19 18   CREATININE 1.36*  --  1.16 1.05 0.99  CALCIUM 9.0  --  8.5* 8.5* 8.6*  MG  --  1.9  --   --   --   PHOS  --  3.0  --   --   --    GFR: Estimated Creatinine Clearance: 72.2 mL/min (by C-G formula based on SCr of 0.99 mg/dL). Liver Function Tests: Recent Labs  Lab 11/18/19 0932 11/19/19 0333  AST 21 18  ALT 17 14  ALKPHOS 60 50  BILITOT 1.1 0.6  PROT 7.0 6.3*  ALBUMIN 3.6 3.2*   No results for input(s): LIPASE, AMYLASE in the last 168 hours. No results for input(s): AMMONIA in the last 168 hours. Coagulation Profile: No results for input(s): INR, PROTIME in the last 168 hours. Cardiac Enzymes: No results for input(s): CKTOTAL, CKMB, CKMBINDEX, TROPONINI in the last 168 hours. BNP (last 3 results) No results for input(s): PROBNP in the last 8760 hours. HbA1C: No results for input(s): HGBA1C in the last 72 hours. CBG: No results for input(s): GLUCAP in the last 168 hours. Lipid Profile: No results for input(s): CHOL, HDL, LDLCALC, TRIG, CHOLHDL, LDLDIRECT in the last 72 hours. Thyroid Function Tests: No results for input(s): TSH, T4TOTAL, FREET4, T3FREE, THYROIDAB in the last 72 hours. Anemia Panel: No results for input(s): VITAMINB12, FOLATE, FERRITIN, TIBC, IRON, RETICCTPCT in the last 72 hours. Sepsis Labs: Recent Labs  Lab 11/18/19 0932 11/18/19 1731  PROCALCITON  --  0.12  LATICACIDVEN 1.4  --     Recent Results (from the past 240 hour(s))  Culture, blood (routine x 2)     Status: None (Preliminary result)   Collection Time: 11/18/19 12:35 PM   Specimen: BLOOD  Result Value Ref Range Status   Specimen Description BLOOD LEFT ANTECUBITAL  Final    Special Requests   Final    BOTTLES DRAWN AEROBIC AND ANAEROBIC Blood Culture results may not be optimal due to an excessive volume of blood received in culture bottles   Culture   Final    NO GROWTH 3 DAYS Performed at [facility], [place] [address] [place], [place] [address]    Report Status PENDING  Incomplete  Culture, blood (routine x 2)     Status: None (Preliminary result)   Collection Time: 11/18/19  3:03 PM   Specimen: BLOOD  Result Value Ref Range Status   Specimen Description BLOOD RIGHT ANTECUBITAL  Final   Special Requests   Final    BOTTLES DRAWN AEROBIC AND ANAEROBIC Blood Culture results may not be optimal due to an inadequate volume of blood received in culture bottles   Culture   Final    NO GROWTH 3 DAYS Performed at [facility] [address] [place], [place] [address]    Report Status PENDING  Incomplete  SARS Coronavirus 2 by RT PCR (hospital order, performed in [facility] hospital lab) Nasopharyngeal Nasopharyngeal Swab     Status: None   Collection Time: 11/18/19  5:19 PM   Specimen: Nasopharyngeal Swab  Result Value Ref Range Status   SARS Coronavirus 2 NEGATIVE NEGATIVE Final    Comment: (NOTE) SARS-CoV-2 target nucleic acids are NOT DETECTED.  The SARS-CoV-2 RNA is generally detectable in upper and lower respiratory specimens during the acute phase of infection. The lowest concentration of SARS-CoV-2 viral copies this assay can detect is 250 copies / mL. A negative result does not preclude SARS-CoV-2 infection and should not be used as the sole basis for treatment or other patient management decisions.  A negative result may occur with improper specimen collection / handling, submission of specimen other than nasopharyngeal swab, presence of viral mutation(s) within the areas targeted by this assay, and inadequate number of viral copies (<250 copies / mL). A negative result must be combined with clinical observations, patient history, and  epidemiological information.  Fact Sheet for Patients:   StrictlyIdeas.no  Fact Sheet for Healthcare Providers: BankingDealers.co.za  This test is not yet approved or  cleared by the [place] FDA and has been authorized for detection and/or diagnosis of SARS-CoV-2 by FDA under an Emergency Use Authorization (EUA).  This EUA will remain in effect (meaning this test can be used) for the duration of the COVID-19 declaration under Section 564(b)(1) of the Act, 21 U.S.C. section 360bbb-3(b)(1), unless the authorization is terminated or revoked sooner.  Performed at [facility], [place] [address], [place], [place] [address]   Culture, Urine     Status: None   Collection Time: 11/19/19 10:01 AM   Specimen: Urine, Random  Result Value Ref Range Status   Specimen Description URINE, RANDOM  Final   Special Requests NONE  Final   Culture   Final    NO GROWTH Performed at [facility], [place] [address] [place], [place] [address]    Report Status 11/20/2019 FINAL  Final         Radiology Studies: CT ANKLE RIGHT W CONTRAST  Result Date: 11/21/2019 CLINICAL DATA:  Sepsis. Recent ankle surgery. Drainage from wound. EXAM: CT OF THE RIGHT ANKLE WITH CONTRAST TECHNIQUE: Multidetector CT imaging of the right ankle was performed following the standard protocol during bolus administration of intravenous contrast. CONTRAST:  17mL OMNIPAQUE IOHEXOL 300 MG/ML  SOLN COMPARISON:  Radiographs 07/11/2019 and 11/18/2019 FINDINGS: Bones/Joint/Cartilage Surgical fixation hardware is noted. There is a long plate and multiple screws along the lateral aspect of the fibula. The oblique coursing distal ulna shaft fracture is ununited. The fracture line is persistent and the edges of the fracture appear corticated and smooth. I do not see any destructive bony changes to suggest osteomyelitis. Single interosseous screw is intact. No complicating features.  Two medial malleolar screws are well position without complicating features. There is also a small smooth pin noted in the tibia which may have broken off during the  surgery. The medial malleolus fracture is largely healed. The ankle mortise is maintained. Subchondral cystic type changes are noted involving the medial talar dome. No discrete osteochondral lesion. No obvious ankle joint effusion. No findings suspicious for septic arthritis. The subtalar joints are maintained. Mild subcutaneous soft tissue swelling is noted and could reflect mild cellulitis. I do not see an obvious soft tissue abscess or obvious open wound. IMPRESSION: 1. The oblique coursing distal ulna shaft fracture is ununited. The fracture line is persistent and the edges of the fracture appear corticated and smooth. 2. The medial malleolus fracture is largely healed. 3. No destructive bony changes to suggest osteomyelitis. 4. No findings suspicious for septic arthritis. 5. Mild subcutaneous soft tissue swelling is noted and could reflect mild cellulitis. No obvious soft tissue abscess or obvious open wound. Electronically Signed   By: Marijo Sanes M.D.   On: 11/21/2019 10:02   CT RENAL STONE STUDY  Result Date: 11/19/2019 CLINICAL DATA:  Hematuria EXAM: CT ABDOMEN AND PELVIS WITHOUT CONTRAST TECHNIQUE: Multidetector CT imaging of the abdomen and pelvis was performed following the standard protocol without IV contrast. COMPARISON:  05/31/2014 FINDINGS: Lower chest: Elevation of the left hemidiaphragm. Left base scarring or atelectasis. No acute abnormality. Hepatobiliary: No focal hepatic abnormality. Gallbladder unremarkable. Pancreas: No focal abnormality or ductal dilatation. Spleen: No focal abnormality.  Normal size. Adrenals/Urinary Tract: Multiple small left renal stones. There is a 10 mm left renal pelvic stone. No ureteral stones or hydronephrosis. Punctate stones in the midpole of the right kidney. Adrenal glands and urinary  bladder unremarkable. Stomach/Bowel: Left colonic diverticulosis. Moderate stool burden. Stomach and small bowel decompressed, unremarkable. Vascular/Lymphatic: Aortic atherosclerosis. No enlarged abdominal or pelvic lymph nodes. Reproductive: No visible focal abnormality. Other: No free fluid or free air. Musculoskeletal: No acute bony abnormality. Diffuse degenerative changes throughout the lumbar spine. IMPRESSION: Bilateral nephrolithiasis, left greater than right. 10 mm left renal pelvic stone. No hydronephrosis. Moderate stool burden. Left colonic diverticulosis.  No active diverticulitis. Aortic atherosclerosis. Electronically Signed   By: Rolm Baptise M.D.   On: 11/19/2019 22:37        Scheduled Meds: . amLODipine  5 mg Oral QHS  . pantoprazole  40 mg Oral Daily  . rivaroxaban  20 mg Oral Q supper  . sodium chloride flush  3 mL Intravenous Once   Continuous Infusions: . azithromycin 500 mg (11/21/19 1801)  . cefTRIAXone (ROCEPHIN)  IV 1 g (11/21/19 1659)     LOS: 3 days    Time spent: 35 minutes.    Elmarie Shiley, MD [facility]   If 7PM-7AM, please contact night-coverage [email or web address]  11/21/2019, 7:53 PM

## 2019-11-21 NOTE — Plan of Care (Signed)

## 2019-11-22 LAB — CBC
HCT: 35.9 % — ABNORMAL LOW (ref 39.0–52.0)
Hemoglobin: 11.9 g/dL — ABNORMAL LOW (ref 13.0–17.0)
MCH: 31.4 pg (ref 26.0–34.0)
MCHC: 33.1 g/dL (ref 30.0–36.0)
MCV: 94.7 fL (ref 80.0–100.0)
Platelets: 210 10*3/uL (ref 150–400)
RBC: 3.79 MIL/uL — ABNORMAL LOW (ref 4.22–5.81)
RDW: 13 % (ref 11.5–15.5)
WBC: 6.7 10*3/uL (ref 4.0–10.5)
nRBC: 0 % (ref 0.0–0.2)

## 2019-11-22 LAB — BASIC METABOLIC PANEL
Anion gap: 9 (ref 5–15)
BUN: 17 mg/dL (ref 8–23)
CO2: 22 mmol/L (ref 22–32)
Calcium: 8.6 mg/dL — ABNORMAL LOW (ref 8.9–10.3)
Chloride: 106 mmol/L (ref 98–111)
Creatinine, Ser: 1.11 mg/dL (ref 0.61–1.24)
GFR calc Af Amer: >60 mL/min (ref >60)
GFR calc non Af Amer: >60 mL/min (ref >60)
Glucose, Bld: 122 mg/dL — ABNORMAL HIGH (ref 70–99)
Potassium: 4.3 mmol/L (ref 3.5–5.1)
Sodium: 137 mmol/L (ref 135–145)

## 2019-11-22 MED ORDER — SULFAMETHOXAZOLE-TRIMETHOPRIM 800-160 MG PO TABS: 1.0000 | ORAL_TABLET | Freq: Two times a day (BID) | ORAL | 0 refills | Status: AC

## 2019-11-22 MED ORDER — FUROSEMIDE 20 MG PO TABS: 20.0000 mg | ORAL_TABLET | ORAL | 0 refills | Status: DC

## 2019-11-22 NOTE — Evaluation (Signed)
Physical Therapy Evaluation Patient Details Name: Brendan Hopkins MRN: [id number] DOB: 1936/03/19 Today's Date: 11/22/2019   History of Present Illness  84 yo male admitted to ED on 6/27 with R foot swelling/drainage, fevers. Pt with medical diagnosis of sepsis secondary to nephrolithasis L>R, ortho consulted with no signs of R ankle osteomyelitis and R ankle clinically stable s/p ORIF 06/2019. CXR reveals ILD with atelectasis vs infiltrates. PMH includes right ankle trimalleolar fracture status post ORIF on 07/09/2019, hypertension, GERD, insomnia, obesity, history of right lower extremity DVT-on Xarelto.  Clinical Impression   Pt presents with [facility] strength, dyspnea on exertion with SpO2 90-92% on RA, mildly impaired higher level balance DGI score 21/24 (not at increased risk of falls per scoring), and decreased activity tolerance. Pt to benefit from acute PT to address deficits. Pt ambulated hallway distance, pt requiring x1 standing rest break to recover dyspnea on exertion and LE fatigue. Pt with heavy accessory muscle breathing noted, breathing technique and respiratory recovery improved with PT-instructed diaphragmatic breathing. PT encouraged pt to be up and walking 5x/day upon d/c for 5 minute bouts, working up to 1x/hour. Pt also encouraged to start inside home with flat surfaces before progressing outside. Pt understands. No PT follow up currently warranted. PT to progress mobility as tolerated, and will continue to follow acutely.      Follow Up Recommendations No PT follow up    Equipment Recommendations  None recommended by PT    Recommendations for Other Services       Precautions / Restrictions Precautions Precautions: None Restrictions Weight Bearing Restrictions: No      Mobility  Bed Mobility Overal bed mobility: Modified Independent             General bed mobility comments: Mod I for increased time and effort, no physical assist needed.  Transfers Overall  transfer level: Needs assistance Equipment used: None Transfers: Sit to/from Stand Sit to Stand: Supervision         General transfer comment: supervision for safety, increased time to rise and steady.  Ambulation/Gait Ambulation/Gait assistance: Supervision Gait Distance (Feet): 375 Feet Assistive device: None Gait Pattern/deviations: Step-through pattern;Trunk flexed;Decreased stride length Gait velocity: slightly decr   General Gait Details: supervision for safety, verbal cuing for breathing technique and rest breaks as needed. SpO2 90-92% on RA.  Stairs            Wheelchair Mobility    Modified Rankin (Stroke Patients Only)       Balance Overall balance assessment: Needs assistance;History of Falls Sitting-balance support: No upper extremity supported;Feet supported Sitting balance-Leahy Scale: Good     Standing balance support: No upper extremity supported;During functional activity Standing balance-Leahy Scale: Good Standing balance comment: see DGI                 Standardized Balance Assessment Standardized Balance Assessment : Dynamic Gait Index   Dynamic Gait Index Level Surface: Normal Change in Gait Speed: Mild Impairment Gait with Horizontal Head Turns: Normal Gait with Vertical Head Turns: Normal Gait and Pivot Turn: Normal Step Over Obstacle: Mild Impairment Step Around Obstacles: Normal Steps: Mild Impairment (inferred) Total Score: 21       Pertinent Vitals/Pain Pain Assessment: Faces Faces Pain Scale: No hurt Pain Intervention(s): Limited activity within patient's tolerance;Monitored during session    Home Living Family/patient expects to be discharged to:: Private residence Living Arrangements: Other relatives (granddaughter) Available Help at Discharge: Family Type of Home: House Home Access: Ramped entrance  Home Layout: One level Home Equipment: Walker - 2 wheels;Other (comment);Bedside commode      Prior  Function Level of Independence: Independent         Comments: Pt enjoys gardening, being outside     Hand Dominance   Dominant Hand: Right    Extremity/Trunk Assessment   Upper Extremity Assessment Upper Extremity Assessment: Defer to OT evaluation    Lower Extremity Assessment Lower Extremity Assessment: Overall WFL for tasks assessed    Cervical / Trunk Assessment Cervical / Trunk Assessment: Normal  Communication   Communication: No difficulties;HOH  Cognition Arousal/Alertness: Awake/alert Behavior During Therapy: WFL for tasks assessed/performed Overall Cognitive Status: Within Functional Limits for tasks assessed                                        General Comments      Exercises Other Exercises Other Exercises: Diaphragmatic breathing with external cue of PT/pt hand, as pt with heavy accessory muscle breathing   Assessment/Plan    PT Assessment Patient needs continued PT services  PT Problem List Decreased mobility;Decreased activity tolerance;Decreased balance;Cardiopulmonary status limiting activity       PT Treatment Interventions Therapeutic activities;Gait training;Therapeutic exercise;Patient/family education;Balance training;Functional mobility training    PT Goals (Current goals can be found in the Care Plan section)  Acute Rehab PT Goals Patient Stated Goal: get tolerance for activity up PT Goal Formulation: With patient Time For Goal Achievement: 12/06/19 Potential to Achieve Goals: Good    Frequency Min 3X/week   Barriers to discharge        Co-evaluation               AM-PAC PT "6 Clicks" Mobility  Outcome Measure Help needed turning from your back to your side while in a flat bed without using bedrails?: None Help needed moving from lying on your back to sitting on the side of a flat bed without using bedrails?: None Help needed moving to and from a bed to a chair (including a wheelchair)?: None Help  needed standing up from a chair using your arms (e.g., wheelchair or bedside chair)?: None Help needed to walk in hospital room?: A Little Help needed climbing 3-5 steps with a railing? : A Little 6 Click Score: 22    End of Session   Activity Tolerance: Patient tolerated treatment well Patient left: in chair;with call bell/phone within reach;Other (comment) (Dr. Tyrell Antonio in room) Nurse Communication: Mobility status PT Visit Diagnosis: Other abnormalities of gait and mobility (R26.89)    Time: 4503-8882 PT Time Calculation (min) (ACUTE ONLY): 23 min   Charges:   PT Evaluation $PT Eval Low Complexity: 1 Low PT Treatments $Gait Training: 8-22 mins        Eunice Oldaker E, PT Acute Rehabilitation Services Pager [phone]  Office [phone]   Arelly Whittenberg D Zoha Spranger 11/22/2019, 10:32 AM

## 2019-11-22 NOTE — Care Management Important Message (Signed)
Important Message  Patient Details  Name: Brendan Hopkins MRN: [id number] Date of Birth: Apr 09, 1936   Medicare Important Message Given:  Yes  Patient left prior to IM delivery.  IM mailed to the patient address  Orbie Pyo 11/22/2019, 1:19 PM

## 2019-11-22 NOTE — Discharge Summary (Signed)
Physician Discharge Summary  Brendan Hopkins EXB:[id number] DOB: [birth year] DOA: 11/18/2019  PCP: [name], MD  Admit date: 11/18/2019 Discharge date: 11/22/2019  Admitted From: Home  Disposition:  Home   Recommendations for Outpatient Follow-up:  1. Follow up with PCP in 1-2 weeks 2. Please obtain BMP/CBC in one week 3. Follow with ortho for further evaluation of ankle fracture, cellulitis.  4. Follow up with urology for evaluation of hematuria and kidney stone.  5. Monitor renal function on Bactrim  Home Health: none  Discharge Condition: Stable.  CODE STATUS: Full code Diet recommendation: Heart Healthy   Brief/Interim Summary: Patient admitted to [facility] for further mx of sepsis with ortho consultation. Patient alsoreportsintermittent gross hematuria over the last few weeks. He related it to being on a blood thinner. He states sometimes it is pink urine and sometimes gross blood upon urination. He does report history of kidney stones which he had passed spontaneously but denies any significant associated pain during these episodes.  1-Sepsis POA; Secondary to PNA vs right ankle infection,  Chest x ray with chronic interstitial finding superimpose PNA.  CT ankle showed cellulitis, no abscess, no bone infection.  Treated with  IV antibiotics ceftriaxone and azithro Discharge on Bactriom for 10 days per ortho recommendations.   2-History of right ankle fracture.  ORIF February 2021 [place] following Follow ortho recommendation in regards CT ankle result. Per ortho discharge patient on Bactrim for 10 days.   3-Mild AKI, Hyponatremia; improved with IV fluids. Stable.  4-History of right LE DVT;  Continue with xarelto 5-Hematuria;  -CT;Bilateral nephrolithiasis, left greater than right. 10 mm left renal pelvic stone. No hydronephrosis -Urology recommend out patient follow up LE edema; resume lasix at discharge.     Discharge Diagnoses:  Principal Problem:    SIRS (systemic inflammatory response syndrome) (HCC) Active Problems:   HTN (hypertension)   HLD (hyperlipidemia)   Closed right ankle fracture   Acute deep vein thrombosis (DVT) of tibial vein of right lower extremity (HCC)   COPD (chronic obstructive pulmonary disease) (HCC)   Status post ORIF of fracture of ankle   AKI (acute kidney injury) ([place])   Hematuria   Insomnia   GERD (gastroesophageal reflux disease)    Discharge Instructions  Discharge Instructions    Diet - low sodium heart healthy   Complete by: As directed    Increase activity slowly   Complete by: As directed      Allergies as of 11/22/2019   No Known Allergies     Medication List    STOP taking these medications   hydrochlorothiazide 25 MG tablet Commonly known as: HYDRODIURIL   HYDROcodone-acetaminophen 5-325 MG tablet Commonly known as: NORCO/VICODIN   solifenacin 10 MG tablet Commonly known as: VESIcare     TAKE these medications   acetaminophen 650 MG CR tablet Commonly known as: TYLENOL Take 650 mg by mouth every 8 (eight) hours as needed for pain.   amLODipine 5 MG tablet Commonly known as: NORVASC Take 1 tablet (5 mg total) by mouth daily. What changed: when to take this   fish oil-omega-3 fatty acids 1000 MG capsule Take 1 g by mouth daily.   furosemide 20 MG tablet Commonly known as: LASIX Take 1 tablet (20 mg total) by mouth every other day.   IBGARD PO Take 2 capsules by mouth See admin instructions. Take 2 capsules in the morning, at lunch, late after noon and bed time and in middle of the night for a  total of 10 tablets   meclizine 25 MG tablet Commonly known as: ANTIVERT TAKE ONE TABLET BY MOUTH THREE TIMES DAILY AS NEEDED FOR DIZZINESS What changed:   how much to take  how to take this  when to take this  reasons to take this   multivitamin with minerals Tabs tablet Take 1 tablet by mouth daily.   pantoprazole 40 MG tablet Commonly known as: PROTONIX Take 1  tablet (40 mg total) by mouth daily.   rivaroxaban 20 MG Tabs tablet Commonly known as: XARELTO Take 1 tablet (20 mg total) by mouth daily with supper. What changed: Another medication with the same name was removed. Continue taking this medication, and follow the directions you see here.   sulfamethoxazole-trimethoprim 800-160 MG tablet Commonly known as: BACTRIM DS Take 1 tablet by mouth 2 (two) times daily for 10 days.   zolpidem 10 MG tablet Commonly known as: AMBIEN Take 1 tablet (10 mg total) by mouth at bedtime as needed for sleep.       Follow-up Information    [name], MD Follow up in 1 week(s).   Specialty: Family Medicine Contact information: [address] [place] [address] [phone]        Robley Fries, MD Follow up.   Specialty: Urology Why: call office to schedule appointment for follow up ,, for blood in the urine and kidney stones.  Contact information: [place] [address] [phone]        Shona Needles, MD. Go on 12/03/2019.   Specialty: Orthopedic Surgery Why: Appointment scheduled for 12/03/19 9:00AM Contact information: [place] [address] [phone]              No Known Allergies  Consultations:  Ortho, [place]   Procedures/Studies: DG Chest 2 View  Result Date: 11/18/2019 CLINICAL DATA:  Chills. EXAM: CHEST - 2 VIEW COMPARISON:  April 12, 2019 FINDINGS: There are persistent prominent interstitial lung markings with hazy opacities at the lung bases bilaterally. This is similar to prior studies. There is no pneumothorax. No large pleural effusion. There is stable mild elevation of the left hemidiaphragm. There are degenerative changes throughout the visualized thoracic spine. IMPRESSION: Prominent interstitial lung markings with hazy opacities at the lung bases bilaterally, similar to prior studies. Findings may represent chronic interstitial lung  disease with superimposed atelectasis or infiltrates not excluded. Electronically Signed   By: Constance Holster M.D.   On: 11/18/2019 14:57   DG Ankle Complete Right  Result Date: 11/18/2019 CLINICAL DATA:  Pain. EXAM: RIGHT ANKLE - COMPLETE 3+ VIEW COMPARISON:  07/11/2019 FINDINGS: The patient's ankle ORIF hardware is intact. There is no new acute displaced fracture. There is evidence for interval healing of the previously demonstrated fractures. There is persistent surrounding soft tissue swelling with an ankle joint effusion. There is a small plantar calcaneal spur. IMPRESSION: 1. No acute displaced fracture. 2. Interval healing of the previously demonstrated fractures. Electronically Signed   By: Constance Holster M.D.   On: 11/18/2019 14:55   CT ANKLE RIGHT W CONTRAST  Result Date: 11/21/2019 CLINICAL DATA:  Sepsis. Recent ankle surgery. Drainage from wound. EXAM: CT OF THE RIGHT ANKLE WITH CONTRAST TECHNIQUE: Multidetector CT imaging of the right ankle was performed following the standard protocol during bolus administration of intravenous contrast. CONTRAST:  153mL OMNIPAQUE IOHEXOL 300 MG/ML  SOLN COMPARISON:  Radiographs 07/11/2019 and 11/18/2019 FINDINGS: Bones/Joint/Cartilage Surgical fixation hardware is noted.  There is a long plate and multiple screws along the lateral aspect of the fibula. The oblique coursing distal ulna shaft fracture is ununited. The fracture line is persistent and the edges of the fracture appear corticated and smooth. I do not see any destructive bony changes to suggest osteomyelitis. Single interosseous screw is intact. No complicating features. Two medial malleolar screws are well position without complicating features. There is also a small smooth pin noted in the tibia which may have broken off during the surgery. The medial malleolus fracture is largely healed. The ankle mortise is maintained. Subchondral cystic type changes are noted involving the medial talar  dome. No discrete osteochondral lesion. No obvious ankle joint effusion. No findings suspicious for septic arthritis. The subtalar joints are maintained. Mild subcutaneous soft tissue swelling is noted and could reflect mild cellulitis. I do not see an obvious soft tissue abscess or obvious open wound. IMPRESSION: 1. The oblique coursing distal ulna shaft fracture is ununited. The fracture line is persistent and the edges of the fracture appear corticated and smooth. 2. The medial malleolus fracture is largely healed. 3. No destructive bony changes to suggest osteomyelitis. 4. No findings suspicious for septic arthritis. 5. Mild subcutaneous soft tissue swelling is noted and could reflect mild cellulitis. No obvious soft tissue abscess or obvious open wound. Electronically Signed   By: Marijo Sanes M.D.   On: 11/21/2019 10:02   CT RENAL STONE STUDY  Result Date: 11/19/2019 CLINICAL DATA:  Hematuria EXAM: CT ABDOMEN AND PELVIS WITHOUT CONTRAST TECHNIQUE: Multidetector CT imaging of the abdomen and pelvis was performed following the standard protocol without IV contrast. COMPARISON:  05/31/2014 FINDINGS: Lower chest: Elevation of the left hemidiaphragm. Left base scarring or atelectasis. No acute abnormality. Hepatobiliary: No focal hepatic abnormality. Gallbladder unremarkable. Pancreas: No focal abnormality or ductal dilatation. Spleen: No focal abnormality.  Normal size. Adrenals/Urinary Tract: Multiple small left renal stones. There is a 10 mm left renal pelvic stone. No ureteral stones or hydronephrosis. Punctate stones in the midpole of the right kidney. Adrenal glands and urinary bladder unremarkable. Stomach/Bowel: Left colonic diverticulosis. Moderate stool burden. Stomach and small bowel decompressed, unremarkable. Vascular/Lymphatic: Aortic atherosclerosis. No enlarged abdominal or pelvic lymph nodes. Reproductive: No visible focal abnormality. Other: No free fluid or free air. Musculoskeletal: No  acute bony abnormality. Diffuse degenerative changes throughout the lumbar spine. IMPRESSION: Bilateral nephrolithiasis, left greater than right. 10 mm left renal pelvic stone. No hydronephrosis. Moderate stool burden. Left colonic diverticulosis.  No active diverticulitis. Aortic atherosclerosis. Electronically Signed   By: Rolm Baptise M.D.   On: 11/19/2019 22:37   VAS [place] LOWER EXTREMITY VENOUS (DVT)  Result Date: 11/07/2019  Lower Venous DVTStudy Indications: Follow up right PTV DVT on 08/03/19.  Anticoagulation: Xarelto. Comparison       Right LEV 08/03/19 showed acute DVT in the posterior tibial Study:           veins. Performing Technologist: Oda Cogan RDMS, RVT  Examination Guidelines: A complete evaluation includes B-mode imaging, spectral Doppler, color Doppler, and power Doppler as needed of all accessible portions of each vessel. Bilateral testing is considered an integral part of a complete examination. Limited examinations for reoccurring indications may be performed as noted. The reflux portion of the exam is performed with the patient in reverse Trendelenburg.  +---------+---------------+---------+-----------+----------+--------------+ RIGHT    CompressibilityPhasicitySpontaneityPropertiesThrombus Aging +---------+---------------+---------+-----------+----------+--------------+ CFV      Full           Yes      Yes                                 +---------+---------------+---------+-----------+----------+--------------+  SFJ      Full                                                        +---------+---------------+---------+-----------+----------+--------------+ FV Prox  Full                                                        +---------+---------------+---------+-----------+----------+--------------+ FV Mid   Full                                                        +---------+---------------+---------+-----------+----------+--------------+ FV  DistalFull                                                        +---------+---------------+---------+-----------+----------+--------------+ PFV      Full                                                        +---------+---------------+---------+-----------+----------+--------------+ POP      Full           Yes      Yes                                 +---------+---------------+---------+-----------+----------+--------------+ PTV      Partial                                      Chronic        +---------+---------------+---------+-----------+----------+--------------+ PERO     Full                                                        +---------+---------------+---------+-----------+----------+--------------+ 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 - Findings appear improved from previous examination.  LEFT: - No evidence of common femoral vein obstruction.  *See table(s) above for measurements and observations. Electronically signed by Harold Barban MD on 11/07/2019 at 11:19:56 PM.    Final       Subjective: Feeling well, ready to go home. No ankle pain. Denies dyspnea.   Discharge Exam: Vitals:   11/21/19 2158 11/22/19 0916  BP: (!) 144/83 (!) 150/93  Pulse: 67 83  Resp: 18 18  Temp: 98 F (36.7 C) 98 F (36.7 C)  SpO2: 96% 96%     General: Pt is alert,  awake, not in acute distress Cardiovascular: RRR, S1/S2 +, no rubs, no gallops Respiratory: CTA bilaterally, no wheezing, no rhonchi Abdominal: Soft, NT, ND, bowel sounds + Extremities: no edema, no cyanosis    The results of significant diagnostics from this hospitalization (including imaging, microbiology, ancillary and laboratory) are listed below for reference.     Microbiology: Recent Results (from the past 240 hour(s))  Culture, blood (routine x 2)     Status: None (Preliminary result)   Collection Time: 11/18/19 12:35 PM   Specimen: BLOOD  Result Value Ref Range Status   Specimen Description BLOOD LEFT ANTECUBITAL  Final   Special Requests   Final    BOTTLES DRAWN AEROBIC AND ANAEROBIC Blood Culture results may not be optimal due to an excessive volume of blood received in culture bottles   Culture   Final    NO GROWTH 4 DAYS Performed at [facility], [place] [address], [place], [place] [address]    Report Status PENDING  Incomplete  Culture, blood (routine x 2)     Status: None (Preliminary result)   Collection Time: 11/18/19  3:03 PM   Specimen: BLOOD  Result Value Ref Range Status   Specimen Description BLOOD RIGHT ANTECUBITAL  Final   Special Requests   Final    BOTTLES DRAWN AEROBIC AND ANAEROBIC Blood Culture results may not be optimal due to an inadequate volume of blood received in culture bottles   Culture   Final    NO GROWTH 4 DAYS Performed at [facility], [place] [address], [place], [place] [address]    Report Status PENDING  Incomplete  SARS Coronavirus 2 by RT PCR (hospital order, performed in [facility] hospital lab) Nasopharyngeal Nasopharyngeal Swab     Status: None   Collection Time: 11/18/19  5:19 PM   Specimen: Nasopharyngeal Swab  Result Value Ref Range Status   SARS Coronavirus 2 NEGATIVE NEGATIVE Final    Comment: (NOTE) SARS-CoV-2 target nucleic acids are NOT DETECTED.  The SARS-CoV-2 RNA is generally detectable in upper and  lower respiratory specimens during the acute phase of infection. The lowest concentration of SARS-CoV-2 viral copies this assay can detect is 250 copies / mL. A negative result does not preclude SARS-CoV-2 infection and should not be used as the sole basis for treatment or other patient management decisions.  A negative result may occur with improper specimen collection / handling, submission of specimen other than nasopharyngeal swab, presence of viral mutation(s) within the areas targeted by this assay, and inadequate number of viral copies (<250 copies / mL). A negative result must be combined with clinical observations, patient history, and epidemiological information.  Fact Sheet for Patients:   StrictlyIdeas.no  Fact Sheet for Healthcare Providers: BankingDealers.co.za  This test is not  yet approved or  cleared by the [place] and has been authorized for detection and/or diagnosis of SARS-CoV-2 by FDA under an Emergency Use Authorization (EUA).  This EUA will remain in effect (meaning this test can be used) for the duration of the COVID-19 declaration under Section 564(b)(1) of the Act, 21 U.S.C. section 360bbb-3(b)(1), unless the authorization is terminated or revoked sooner.  Performed at [facility], [place] [address], [place], [place] [address]   Culture, Urine     Status: None   Collection Time: 11/19/19 10:01 AM   Specimen: Urine, Random  Result Value Ref Range Status   Specimen Description URINE, RANDOM  Final   Special Requests NONE  Final   Culture   Final    NO GROWTH Performed at [facility], [place] [address] [place], [place] [address]    Report Status 11/20/2019 FINAL  Final     Labs: BNP (last 3 results) No results for input(s): BNP in the last 8760 hours. Basic Metabolic Panel: Recent Labs  Lab 11/18/19 0932 11/18/19 1731 11/19/19 0333 11/20/19 1226 11/21/19 0220 11/22/19 0229   NA 133*  --  134* 135 135 137  K 3.8  --  3.8 3.9 3.9 4.3  CL 99  --  102 105 106 106  CO2 22  --  22 20* 21* 22  GLUCOSE 142*  --  116* 120* 140* 122*  BUN 20  --  18 19 18 17   CREATININE 1.36*  --  1.16 1.05 0.99 1.11  CALCIUM 9.0  --  8.5* 8.5* 8.6* 8.6*  MG  --  1.9  --   --   --   --   PHOS  --  3.0  --   --   --   --    Liver Function Tests: Recent Labs  Lab 11/18/19 0932 11/19/19 0333  AST 21 18  ALT 17 14  ALKPHOS 60 50  BILITOT 1.1 0.6  PROT 7.0 6.3*  ALBUMIN 3.6 3.2*   No results for input(s): LIPASE, AMYLASE in the last 168 hours. No results for input(s): AMMONIA in the last 168 hours. CBC: Recent Labs  Lab 11/18/19 0932 11/19/19 0333 11/21/19 0220 11/22/19 0229  WBC 11.8* 9.1 7.5 6.7  NEUTROABS 9.9*  --   --   --   HGB 13.7 12.5* 11.7* 11.9*  HCT 41.9 37.5* 35.7* 35.9*  MCV 93.7 94.5 94.7 94.7  PLT 229 204 190 210   Cardiac Enzymes: No results for input(s): CKTOTAL, CKMB, CKMBINDEX, TROPONINI in the last 168 hours. BNP: Invalid input(s): POCBNP CBG: No results for input(s): GLUCAP in the last 168 hours. D-Dimer No results for input(s): DDIMER in the last 72 hours. Hgb A1c No results for input(s): HGBA1C in the last 72 hours. Lipid Profile No results for input(s): CHOL, HDL, LDLCALC, TRIG, CHOLHDL, LDLDIRECT in the last 72 hours. Thyroid function studies No results for input(s): TSH, T4TOTAL, T3FREE, THYROIDAB in the last 72 hours.  Invalid input(s): FREET3 Anemia work up No results for input(s): VITAMINB12, FOLATE, FERRITIN, TIBC, IRON, RETICCTPCT in the last 72 hours. Urinalysis    Component Value Date/Time   COLORURINE YELLOW 11/18/2019 1625   APPEARANCEUR HAZY (A) 11/18/2019 1625   LABSPEC 1.023 11/18/2019 1625   PHURINE 6.0 11/18/2019 1625   GLUCOSEU NEGATIVE 11/18/2019 1625   HGBUR LARGE (A) 11/18/2019 1625   BILIRUBINUR NEGATIVE 11/18/2019 1625   KETONESUR NEGATIVE 11/18/2019 1625   PROTEINUR 30 (A) 11/18/2019 1625  UROBILINOGEN 0.2 01/01/2013 1028   NITRITE NEGATIVE 11/18/2019 1625   LEUKOCYTESUR NEGATIVE 11/18/2019 1625   Sepsis Labs Invalid input(s): PROCALCITONIN,  WBC,  LACTICIDVEN Microbiology Recent Results (from the past 240 hour(s))  Culture, blood (routine x 2)     Status: None (Preliminary result)   Collection Time: 11/18/19 12:35 PM   Specimen: BLOOD  Result Value Ref Range Status   Specimen Description BLOOD LEFT ANTECUBITAL  Final   Special Requests   Final    BOTTLES DRAWN AEROBIC AND ANAEROBIC Blood Culture results may not be optimal due to an excessive volume of blood received in culture bottles   Culture   Final    NO GROWTH 4 DAYS Performed at [place] [address], [place], [place] [address]    Report Status PENDING  Incomplete  Culture, blood (routine x 2)     Status: None (Preliminary result)   Collection Time: 11/18/19  3:03 PM   Specimen: BLOOD  Result Value Ref Range Status   Specimen Description BLOOD RIGHT ANTECUBITAL  Final   Special Requests   Final    BOTTLES DRAWN AEROBIC AND ANAEROBIC Blood Culture results may not be optimal due to an inadequate volume of blood received in culture bottles   Culture   Final    NO GROWTH 4 DAYS Performed at [facility], [place] [address], [place], [place] [address]    Report Status PENDING  Incomplete  SARS Coronavirus 2 by RT PCR (hospital order, performed in [facility] hospital lab) Nasopharyngeal Nasopharyngeal Swab     Status: None   Collection Time: 11/18/19  5:19 PM   Specimen: Nasopharyngeal Swab  Result Value Ref Range Status   SARS Coronavirus 2 NEGATIVE NEGATIVE Final    Comment: (NOTE) SARS-CoV-2 target nucleic acids are NOT DETECTED.  The SARS-CoV-2 RNA is generally detectable in upper and lower respiratory specimens during the acute phase of infection. The lowest concentration of SARS-CoV-2 viral copies this assay can detect is 250 copies / mL. A negative result does not preclude SARS-CoV-2  infection and should not be used as the sole basis for treatment or other patient management decisions.  A negative result may occur with improper specimen collection / handling, submission of specimen other than nasopharyngeal swab, presence of viral mutation(s) within the areas targeted by this assay, and inadequate number of viral copies (<250 copies / mL). A negative result must be combined with clinical observations, patient history, and epidemiological information.  Fact Sheet for Patients:   StrictlyIdeas.no  Fact Sheet for Healthcare Providers: BankingDealers.co.za  This test is not yet approved or  cleared by the [place] FDA and has been authorized for detection and/or diagnosis of SARS-CoV-2 by FDA under an Emergency Use Authorization (EUA).  This EUA will remain in effect (meaning this test can be used) for the duration of the COVID-19 declaration under Section 564(b)(1) of the Act, 21 U.S.C. section 360bbb-3(b)(1), unless the authorization is terminated or revoked sooner.  Performed at [facility], [place] [address] [place], [place] [address]   Culture, Urine     Status: None   Collection Time: 11/19/19 10:01 AM   Specimen: Urine, Random  Result Value Ref Range Status   Specimen Description URINE, RANDOM  Final   Special Requests NONE  Final   Culture   Final    NO GROWTH Performed at [facility], [place] [address] [place], [place] [address]    Report Status 11/20/2019  FINAL  Final     Time coordinating discharge: 40 minutes  SIGNED:   Elmarie Shiley, MD  [facility]

## 2019-11-22 NOTE — Progress Notes (Signed)
Ortho Progress Note  Reviewed CT scan of ankle. No overt signs of infection on scan. Fracture has some concerning signs for nonunion. Clinically ankle looks very good. Will defer any surgical intervention on ankle. Will recommend outpatient follow up on the week of June 12th with course of PO oral antibiotics. Patient previously responded well with bactrim so I would recommend that medication.  Shona Needles, MD [facility] [phone] (office) [email or web address]

## 2019-11-23 LAB — CULTURE, BLOOD (ROUTINE X 2)
Culture: NO GROWTH
Culture: NO GROWTH

## 2019-11-27 ENCOUNTER — Other Ambulatory Visit: Payer: Self-pay

## 2019-11-27 MED ORDER — PANTOPRAZOLE SODIUM 40 MG PO TBEC: 40.0000 mg | DELAYED_RELEASE_TABLET | Freq: Every day | ORAL | 3 refills | Status: DC

## 2019-11-28 DIAGNOSIS — N200 Calculus of kidney: Secondary | ICD-10-CM | POA: Diagnosis not present

## 2019-11-28 DIAGNOSIS — R310 Gross hematuria: Secondary | ICD-10-CM | POA: Diagnosis not present

## 2019-11-30 ENCOUNTER — Other Ambulatory Visit: Payer: Self-pay | Admitting: Urology

## 2019-12-03 ENCOUNTER — Ambulatory Visit (INDEPENDENT_AMBULATORY_CARE_PROVIDER_SITE_OTHER): Payer: Medicare Other | Admitting: Family Medicine

## 2019-12-03 ENCOUNTER — Other Ambulatory Visit: Payer: Self-pay

## 2019-12-03 ENCOUNTER — Ambulatory Visit
Admission: RE | Admit: 2019-12-03 | Discharge: 2019-12-03 | Disposition: A | Discharge status: Home / Self Care | Payer: Medicare Other | Source: Ambulatory Visit | Attending: Family Medicine | Admitting: Family Medicine

## 2019-12-03 VITALS — BP 126/60 | HR 88 | Temp 98.2°F | Ht 71.0 in | Wt 230.0 lb

## 2019-12-03 DIAGNOSIS — Z09 Encounter for follow-up examination after completed treatment for conditions other than malignant neoplasm: Secondary | ICD-10-CM | POA: Diagnosis not present

## 2019-12-03 DIAGNOSIS — R0689 Other abnormalities of breathing: Secondary | ICD-10-CM | POA: Diagnosis not present

## 2019-12-03 DIAGNOSIS — S82851D Displaced trimalleolar fracture of right lower leg, subsequent encounter for closed fracture with routine healing: Secondary | ICD-10-CM | POA: Diagnosis not present

## 2019-12-03 DIAGNOSIS — R918 Other nonspecific abnormal finding of lung field: Secondary | ICD-10-CM | POA: Diagnosis not present

## 2019-12-03 DIAGNOSIS — I700 Atherosclerosis of aorta: Secondary | ICD-10-CM | POA: Diagnosis not present

## 2019-12-03 DIAGNOSIS — L03115 Cellulitis of right lower limb: Secondary | ICD-10-CM | POA: Diagnosis not present

## 2019-12-03 NOTE — Progress Notes (Signed)
Subjective:    Patient ID: Brendan Hopkins, male    DOB: [birth year], 84 y.o.   MRN: [id number]  HPI 08/02/19 Recently admitted to hospital with ankle fracture.  I copied relevant portions of the discharge summary below:  Admit date: 07/11/2019 Discharge date: 07/13/2019  Admission Diagnoses: Right trimalleolar ankle fracture   Discharge Diagnoses:  Principal Problem:   Closed displaced trimalleolar fracture of right lower leg Active Problems:   Closed right ankle fracture   Procedures Performed: 1. CPT 27822-Open reduction internal fixation of right trimalleolar ankle fracture 2. CPT 27829-Open reduction internal fixation of right syndesmotic disruption  Discharged Condition: good  Hospital Course: Patient presented to [facility] on 07/11/2019 for scheduled surgical fixation of right ankle.  Was taken to the operating room by Dr. Lennette Bihari Haddix for the above procedure.  He tolerated this well without complications.  Was placed in a short leg splint postoperatively with instructions to be nonweightbearing.  Was admitted overnight for pain control and observation.  Began working with physical and occupational therapy starting on postoperative day #1.  Was started on aspirin 325 mg twice daily for DVT prophylaxis starting on postoperative day #1.  Due to lack of support at home patient decided to pursue SNF placement for acute rehab.  Case management/social work was consulted to begin this process on postop day #1.  The remainder of the patient's hospitalization was dedicated to increasing mobility. On 07/13/2019, the patient was tolerating diet,working well with therapies, pain well controlled, vital signs stable,dressings clean, dry, intactand felt stable for dischargeto SNF ([facility]). Patient will follow up as below and knows to call with questions or concerns.  Patient has substantial swelling in his right leg distal to the knee.  He has +2 pitting edema in his  right leg and +1 pitting edema in his left leg.  Calf circumference in his right leg is 18 inches.  Calf circumference of his left leg is 17 inches.  He has some pain in his right leg as well would expect due to the fracture.  He has been immobile and has had to remove his cam walker due to pain in his leg.  He is not putting any weight on his leg.  He denies any chest pain shortness of breath or dyspnea on exertion.  He is not requiring any pain medication other than Tylenol.  He is now only taking a baby aspirin and is not on any other anticoagulant.  However at the skilled nursing facility due to Covid restrictions he was basically confined to his room lying in bed as he was unable to leave the room.  Therefore he has been extremely immobile.  At that time, my plan was: Given the tremendous amount of swelling in his right leg I am going to have the patient evaluated for a DVT.  Otherwise he seems to be doing well.  I encouraged the patient to use a cam walker as a precaution when he is ambulatory even though he is nonweightbearing.  I am concerned that if he awkwardly put his weight on his ankle by accident he could reinjure the ankle and require further surgery.  08/03/19 US revealed right posterior tibial DVT.  Here to discuss.  At that time, my plan was: Xarelto 15 bid for 21 days then 20 mg poqday for a total of 6 months.  Recheck in 1 month or PRN.  09/04/19  While taking Xarelto 15 mg twice a day, the patient experienced episodes  of hematuria.  They were painless.  They resolved after he switched to Xarelto 20 mg a day.  He is seeing no further episodes of hematuria.  He continues to have mild swelling in his right leg particularly worse later in the day after he is been walking on it.  However the pain in his right leg has improved dramatically.  He denies any further episodes of bleeding or bruising.  At that time, my plan was: Switch to Xarelto 20 mg a day for an additional 5 months.  Recheck CBC  today to rule out thrombocytopenia or severe anemia and monitor renal function with a BMP.  As long as the patient does not see any further episodes of hematuria I will not work-up further.  If he experiences gross hematuria again, I would recommend a urology consultation for possible cystoscopy.  12/03/19 Patient was recently admitted to the hospital at the end of June for sepsis.  The origin was felt to be most likely cellulitis around the right ankle.  CT scan obtained of the right ankle showed no evidence of osteomyelitis although it did show soft tissue swelling consistent with cellulitis.  A previous wound culture had showed group B strep/strep achalasia.  Patient was in sepsis at the time of admission.  He was treated with IV antibiotics in the hospital and then transition to 10 days of Bactrim as an outpatient which she has completed yesterday.  He was also thought that he could potentially have right lower lobe pneumonia.  However chest x-ray showed bilateral opacities in the lung bases consistent with possible interstitial lung disease versus infiltrate.  Today on exam, he denies any chest pain shortness of breath or dyspnea on exertion.  He denies any cough.  He denies any fevers or chills.  However he does have right greater than left fine bibasilar crackles that are still present on exam. Past Medical History:  Diagnosis Date  . Acute deep vein thrombosis (DVT) of tibial vein of right lower extremity (HCC)    after ankle fracture/surgery (2/21)  . Arthritis    RIGHT HIP, HANDS, BACK  . Cancer [facility]    Skin cancer   . Cataract   . Cervical radiculopathy    severe C3-4 neuroforaminal stenosis, C5-6 left synovial cyst compressing left nerve root.  . Colon polyps    2005  . COPD (chronic obstructive pulmonary disease) ([place])    SMOKED FOR 20 YRS  . Diverticulosis    2005  . Fatty liver   . GERD (gastroesophageal reflux disease)   . Hypertension   . Pain    RIGHT HIP AND BACK  .  Personal history of colonic polyps-adenoma and sessile serrated polyp 07/15/2010  . RBBB    POSS HX AF IN PAST- PAC'S  . Shortness of breath    WITH EXERTION ONLY  . Stroke ([place])    FEW YEARS AGO - PT SAYS "HEAT STROKE" X 2 - BUT NO PHYSICAL DEFICITS  . TIA (transient ischemic attack)    Past Surgical History:  Procedure Laterality Date  . COLONOSCOPY     maybe 3   . COLONOSCOPY W/ POLYPECTOMY    . ORIF ANKLE FRACTURE Right 07/11/2019  . ORIF ANKLE FRACTURE Right 07/11/2019   Procedure: OPEN REDUCTION INTERNAL FIXATION (ORIF) ANKLE FRACTURE;  Surgeon: Shona Needles, MD;  Location: [place] [facility];  Service: Orthopedics;  Laterality: Right;  . TOTAL HIP ARTHROPLASTY Right 01/09/2013   Procedure: RIGHT TOTAL HIP ARTHROPLASTY ANTERIOR APPROACH;  Surgeon:  [name], MD;  [facility];  Service: Orthopedics;  Laterality: Right;   Current Outpatient Medications on File Prior to Visit  Medication Sig Dispense Refill  . acetaminophen (TYLENOL) 650 MG CR tablet Take 650 mg by mouth every 8 (eight) hours as needed for pain.    Marland Kitchen amLODipine (NORVASC) 5 MG tablet Take 1 tablet (5 mg total) by mouth daily. (Patient taking differently: Take 5 mg by mouth at bedtime. ) 90 tablet 3  . fish oil-omega-3 fatty acids 1000 MG capsule Take 1 g by mouth daily.    . furosemide (LASIX) 20 MG tablet Take 1 tablet (20 mg total) by mouth every other day. 30 tablet 0  . meclizine (ANTIVERT) 25 MG tablet TAKE ONE TABLET BY MOUTH THREE TIMES DAILY AS NEEDED FOR DIZZINESS (Patient taking differently: Take 25 mg by mouth 3 (three) times daily as needed for dizziness. TAKE ONE TABLET BY MOUTH THREE TIMES DAILY AS NEEDED FOR DIZZINESS) 30 tablet 2  . Multiple Vitamin (MULTIVITAMIN WITH MINERALS) TABS Take 1 tablet by mouth daily.    . pantoprazole (PROTONIX) 40 MG tablet Take 1 tablet (40 mg total) by mouth daily. 90 tablet 3  . Peppermint Oil (IBGARD PO) Take 2 capsules by mouth See admin instructions. Take 2 capsules in  the morning, at lunch, late after noon and bed time and in middle of the night for a total of 10 tablets    . rivaroxaban (XARELTO) 20 MG TABS tablet Take 1 tablet (20 mg total) by mouth daily with supper. 30 tablet 4  . zolpidem (AMBIEN) 10 MG tablet Take 1 tablet (10 mg total) by mouth at bedtime as needed for sleep. 15 tablet 1   No current facility-administered medications on file prior to visit.   No Known Allergies Social History   Socioeconomic History  . Marital status: Married    Spouse name: Not on file  . Number of children: Not on file  . Years of education: Not on file  . Highest education level: Not on file  Occupational History  . Occupation: Retired  Tobacco Use  . Smoking status: Former Smoker    Years: 57.00    Types: Cigarettes    Quit date: 05/25/2007    Years since quitting: 12.5  . Smokeless tobacco: Never Used  Vaping Use  . Vaping Use: Never used  Substance and Sexual Activity  . Alcohol use: No    Alcohol/week: 0.0 standard drinks  . Drug use: No  . Sexual activity: Not on file  Other Topics Concern  . 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 Difficulty of Paying Living Expenses:   Food Insecurity:   . Worried About Charity fundraiser in the Last Year:   . Arboriculturist in the Last Year:   Transportation Needs:   . Film/video editor (Medical):   Marland Kitchen Lack of Transportation (Non-Medical):   Physical Activity:   . Days of Exercise per Week:   . Minutes of Exercise per Session:   Stress:   . Feeling of Stress :   Social Connections:   . Frequency of Communication with Friends and Family:   . Frequency of Social Gatherings with Friends and Family:   . Attends Religious Services:   . Active Member of Clubs or Organizations:   .  Attends Archivist Meetings:   Marland Kitchen Marital Status:   Intimate Partner  Violence:   . Fear of Current or Ex-Partner:   . Emotionally Abused:   Marland Kitchen Physically Abused:   . Sexually Abused:     Review of Systems  All other systems reviewed and are negative.      Objective:   Physical Exam Vitals reviewed.  Constitutional:      General: He is not in acute distress.    Appearance: Normal appearance. He is normal weight. He is not ill-appearing or toxic-appearing.  Cardiovascular:     Rate and Rhythm: Normal rate and regular rhythm.     Heart sounds: Normal heart sounds.  Pulmonary:     Effort: Pulmonary effort is normal. No respiratory distress.     Breath sounds: No stridor. Examination of the right-lower field reveals rales. Examination of the left-lower field reveals rales. Rales present. No wheezing or rhonchi.  Chest:     Chest wall: No tenderness.  Musculoskeletal:     Right lower leg: No edema.     Left lower leg: No edema.  Skin:    Findings: No erythema or rash.  Neurological:     Mental Status: He is alert.           Assessment & Plan:    Abnormal breath sounds - Plan: DG Chest [facility] discharge follow-up  Cellulitis of right ankle  Patient has now completed antibiotics and the cellulitis appears to have resolved around his right ankle.  He has no systemic symptoms to qualify for sepsis.  He is doing well.  His vital signs are within normal limits.  However his physical exam is significant for fine bibasilar crackles right greater than left.  I am concerned the patient may have underlying interstitial lung disease rather than pneumonia.  I will repeat the chest x-ray to evaluate for resolution of the pneumonia and consider obtaining a high-resolution CT scan if abnormal breath sounds persist and x-ray shows no evidence for pneumonia.

## 2019-12-04 ENCOUNTER — Encounter: Payer: Self-pay | Admitting: Family Medicine

## 2019-12-04 ENCOUNTER — Other Ambulatory Visit: Payer: Self-pay | Admitting: Family Medicine

## 2019-12-04 DIAGNOSIS — J849 Interstitial pulmonary disease, unspecified: Secondary | ICD-10-CM

## 2019-12-04 NOTE — Progress Notes (Signed)
LVMM for Pam at [facility] letting her know that pt has a CT of chest scheduled for 12/18/19.

## 2019-12-04 NOTE — Progress Notes (Signed)
LVMM for Darrel Reach at [facility] urology in regards to pt's f/u with PCP- Dr Dennard Schaumann and CXR results.

## 2019-12-04 NOTE — Progress Notes (Addendum)
Anesthesia Review:  PCP: Dr Jenna Luo office visit 12/03/19- on chart along with Discharge Summary  Cardiologist : Chest x-ray :12/03/19- results on chart  EKG : Echo : Cardiac Cath :  Activity level:  Sleep Study/ CPAP : Fasting Blood Sugar :      / Checks Blood Sugar -- times a day:   Blood Thinner/ Instructions /Last Dose: ASA / Instructions/ Last Dose :  Cellulitis has resolved around right ankle , MD noted bilbaslar crackles and performed CXR on 12/03/19.  Results on chart.  LVMM for Darrel Reach at [facility]. Please note CXR result from 12/03/19.

## 2019-12-04 NOTE — Progress Notes (Signed)
LVMM for Brendan Hopkins at [facility] that patient wlil need clearance from Dr Jenna Luo for surgery on 12/11/19.

## 2019-12-06 ENCOUNTER — Encounter (HOSPITAL_COMMUNITY): Payer: Self-pay

## 2019-12-06 ENCOUNTER — Inpatient Hospital Stay (HOSPITAL_COMMUNITY)
Admission: RE | Admit: 2019-12-06 | Discharge: 2019-12-06 | Disposition: A | Discharge status: Home / Self Care | Payer: Medicare Other | Source: Ambulatory Visit

## 2019-12-06 HISTORY — DX: Acute embolism and thrombosis of unspecified vein: I82.90

## 2019-12-06 HISTORY — DX: Pneumonia, unspecified organism: J18.9

## 2019-12-06 HISTORY — DX: Presence of spectacles and contact lenses: Z97.3

## 2019-12-06 HISTORY — DX: Unspecified fracture of right lower leg, initial encounter for closed fracture: S82.91XA

## 2019-12-06 HISTORY — DX: Personal history of urinary calculi: Z87.442

## 2019-12-06 HISTORY — DX: Unspecified hearing loss, unspecified ear: H91.90

## 2019-12-06 NOTE — Progress Notes (Signed)
DUE TO COVID-19 ONLY ONE VISITOR IS ALLOWED TO COME WITH YOU AND STAY IN THE WAITING ROOM ONLY DURING PRE OP AND PROCEDURE DAY OF SURGERY. THE 1 VISITOR MAY VISIT WITH YOU AFTER SURGERY IN YOUR PRIVATE ROOM DURING VISITING HOURS ONLY!  YOU NEED TO HAVE A COVID 19 TEST ON_______ @_______ , THIS TEST MUST BE DONE BEFORE SURGERY, COME  [name] , [id number].  ([place]) ONCE YOUR COVID TEST IS COMPLETED, PLEASE BEGIN THE QUARANTINE INSTRUCTIONS AS OUTLINED IN YOUR HANDOUT.                Brendan Hopkins  12/06/2019   Your procedure is scheduled on:  12/11/2019  Report to [facility] Main  Entrance   Report to admitting at    1115 AM     Call this number if you have problems the morning of surgery [phone]    Remember: Do not eat food    :After Midnight. BRUSH YOUR TEETH MORNING OF SURGERY AND RINSE YOUR MOUTH OUT, NO CHEWING GUM CANDY OR MINTS. May have clear liquids from 17midnite until 1015 morning of surgery then nothing by mouth.      Take these medicines the morning of surgery with A SIP OF WATER:  None              You may not have any metal on your body including hair pins and              piercings  Do not wear jewelry, , lotions, powders or perfumes, deodorant                Men may shave face and neck.   Do not bring valuables to the hospital. [place].  Contacts, dentures or bridgework may not be worn into surgery.  Leave suitcase in the car. After surgery it may be brought to your room.     Patients discharged the day of surgery will not be allowed to drive home. IF YOU ARE HAVING SURGERY AND GOING HOME THE SAME DAY, YOU MUST HAVE AN ADULT TO DRIVE YOU HOME AND BE WITH YOU FOR 24 HOURS. YOU MAY GO HOME BY TAXI OR UBER OR ORTHERWISE, BUT AN ADULT MUST ACCOMPANY YOU HOME AND STAY WITH YOU FOR 24 HOURS.  Name and phone number of your driver:                Please read over  the following fact sheets you were given: _____________________________________________________________________             [facility] - Preparing for Surgery Before surgery, you can play an important role.  Because skin is not sterile, your skin needs to be as free of germs as possible.  You can reduce the number of germs on your skin by washing with CHG (chlorahexidine gluconate) soap before surgery.  CHG is an antiseptic cleaner which kills germs and bonds with the skin to continue killing germs even after washing. Please DO NOT use if you have an allergy to CHG or antibacterial soaps.  If your skin becomes reddened/irritated stop using the CHG and inform your nurse when you arrive at Short Stay. Do not shave (including legs and underarms) for at least 48 hours prior to the first CHG shower.  You may shave your face/neck. Please follow these instructions carefully:  1.  Shower with CHG Soap the night before surgery and the  morning of Surgery.  2.  If you choose to wash your hair, wash your hair first as usual with your  normal  shampoo.  3.  After you shampoo, rinse your hair and body thoroughly to remove the  shampoo.                           4.  Use CHG as you would any other liquid soap.  You can apply chg directly  to the skin and wash                       Gently with a scrungie or clean washcloth.  5.  Apply the CHG Soap to your body ONLY FROM THE NECK DOWN.   Do not use on face/ open                           Wound or open sores. Avoid contact with eyes, ears mouth and genitals (private parts).                       Wash face,  Genitals (private parts) with your normal soap.             6.  Wash thoroughly, paying special attention to the area where your surgery  will be performed.  7.  Thoroughly rinse your body with warm water from the neck down.  8.  DO NOT shower/wash with your normal soap after using and rinsing off  the CHG Soap.                9.  Pat yourself dry with a  clean towel.            10.  Wear clean pajamas.            11.  Place clean sheets on your bed the night of your first shower and do not  sleep with pets. Day of Surgery : Do not apply any lotions/deodorants the morning of surgery.  Please wear clean clothes to the hospital/surgery center.  FAILURE TO FOLLOW THESE INSTRUCTIONS MAY RESULT IN THE CANCELLATION OF YOUR SURGERY PATIENT SIGNATURE_________________________________  NURSE SIGNATURE__________________________________  ________________________________________________________________________    CLEAR LIQUID DIET   Foods Allowed                                                                     Foods Excluded  Coffee and tea, regular and decaf                             liquids that you cannot  Plain Jell-O any favor except red or purple                                           see through such as: Fruit ices (not with fruit pulp)  milk, soups, orange juice  Iced Popsicles                                    All solid food Carbonated beverages, regular and diet                                    Cranberry, grape and apple juices Sports drinks like Gatorade Lightly seasoned clear broth or consume(fat free) Sugar, honey syrup  Sample Menu Breakfast                                Lunch                                     Supper Cranberry juice                    Beef broth                            Chicken broth Jell-O                                     Grape juice                           Apple juice Coffee or tea                        Jell-O                                      Popsicle                                                Coffee or tea                        Coffee or tea  _____________________________________________________________________

## 2019-12-06 NOTE — Progress Notes (Signed)
LVMM for patient that he could not take IBGARD am of surgery .  Also placed on preop instruction sheet.

## 2019-12-06 NOTE — Progress Notes (Signed)
Clearance received from DR Jenna Luo for surgery on 12/11/2019

## 2019-12-06 NOTE — Progress Notes (Signed)
LVMM for pt to call to review med hx and go over surgery instructoins and set up covid test prior to surgery.

## 2019-12-07 ENCOUNTER — Other Ambulatory Visit: Payer: Self-pay

## 2019-12-07 ENCOUNTER — Encounter (HOSPITAL_COMMUNITY)
Admission: RE | Admit: 2019-12-07 | Discharge: 2019-12-07 | Disposition: A | Discharge status: Home / Self Care | Payer: Medicare Other | Source: Ambulatory Visit | Attending: Urology | Admitting: Urology

## 2019-12-07 ENCOUNTER — Inpatient Hospital Stay (HOSPITAL_COMMUNITY): Admission: RE | Admit: 2019-12-07 | Payer: Medicare Other | Source: Ambulatory Visit

## 2019-12-07 DIAGNOSIS — Z7901 Long term (current) use of anticoagulants: Secondary | ICD-10-CM | POA: Diagnosis not present

## 2019-12-07 DIAGNOSIS — Z86718 Personal history of other venous thrombosis and embolism: Secondary | ICD-10-CM | POA: Diagnosis not present

## 2019-12-07 DIAGNOSIS — Z01818 Encounter for other preprocedural examination: Secondary | ICD-10-CM | POA: Insufficient documentation

## 2019-12-07 DIAGNOSIS — I10 Essential (primary) hypertension: Secondary | ICD-10-CM | POA: Insufficient documentation

## 2019-12-07 DIAGNOSIS — Z79899 Other long term (current) drug therapy: Secondary | ICD-10-CM | POA: Insufficient documentation

## 2019-12-07 DIAGNOSIS — K219 Gastro-esophageal reflux disease without esophagitis: Secondary | ICD-10-CM | POA: Insufficient documentation

## 2019-12-07 DIAGNOSIS — N200 Calculus of kidney: Secondary | ICD-10-CM | POA: Diagnosis not present

## 2019-12-07 DIAGNOSIS — Z87891 Personal history of nicotine dependence: Secondary | ICD-10-CM | POA: Insufficient documentation

## 2019-12-07 DIAGNOSIS — J449 Chronic obstructive pulmonary disease, unspecified: Secondary | ICD-10-CM | POA: Insufficient documentation

## 2019-12-07 DIAGNOSIS — Z8673 Personal history of transient ischemic attack (TIA), and cerebral infarction without residual deficits: Secondary | ICD-10-CM | POA: Insufficient documentation

## 2019-12-07 LAB — CBC
HCT: 41.3 % (ref 39.0–52.0)
Hemoglobin: 13.3 g/dL (ref 13.0–17.0)
MCH: 30.9 pg (ref 26.0–34.0)
MCHC: 32.2 g/dL (ref 30.0–36.0)
MCV: 95.8 fL (ref 80.0–100.0)
Platelets: 354 10*3/uL (ref 150–400)
RBC: 4.31 MIL/uL (ref 4.22–5.81)
RDW: 13.8 % (ref 11.5–15.5)
WBC: 6.2 10*3/uL (ref 4.0–10.5)
nRBC: 0 % (ref 0.0–0.2)

## 2019-12-07 LAB — BASIC METABOLIC PANEL
Anion gap: 10 (ref 5–15)
BUN: 29 mg/dL — ABNORMAL HIGH (ref 8–23)
CO2: 28 mmol/L (ref 22–32)
Calcium: 9.9 mg/dL (ref 8.9–10.3)
Chloride: 99 mmol/L (ref 98–111)
Creatinine, Ser: 1.19 mg/dL (ref 0.61–1.24)
GFR calc Af Amer: >60 mL/min (ref >60)
GFR calc non Af Amer: 57 mL/min — ABNORMAL LOW (ref >60)
Glucose, Bld: 131 mg/dL — ABNORMAL HIGH (ref 70–99)
Potassium: 4.8 mmol/L (ref 3.5–5.1)
Sodium: 137 mmol/L (ref 135–145)

## 2019-12-08 ENCOUNTER — Other Ambulatory Visit (HOSPITAL_COMMUNITY)
Admission: RE | Admit: 2019-12-08 | Discharge: 2019-12-08 | Disposition: A | Discharge status: Home / Self Care | Payer: Medicare Other | Source: Ambulatory Visit | Attending: Urology | Admitting: Urology

## 2019-12-08 DIAGNOSIS — Z01812 Encounter for preprocedural laboratory examination: Secondary | ICD-10-CM | POA: Insufficient documentation

## 2019-12-08 DIAGNOSIS — Z20822 Contact with and (suspected) exposure to covid-19: Secondary | ICD-10-CM | POA: Insufficient documentation

## 2019-12-08 LAB — SARS CORONAVIRUS 2 (TAT 6-24 HRS): SARS Coronavirus 2: NEGATIVE

## 2019-12-10 NOTE — Progress Notes (Signed)
Anesthesia Chart Review   Case: [id number] Date/Time: 12/11/19 1302   Procedure: CYSTOSCOPY WITH LEFT RETROGRADE LEFT  URETEROSCOPY WITH HOLMIUM LASER AND STENT PLACEMENT (Left )   Anesthesia type: General   Pre-op diagnosis: LEFT RENAL STONE   Location: [facility]   Surgeons: Irine Seal, MD      DISCUSSION:84 y.o. former smoker (quit 05/25/07) with h/o HTN, GERD, COPD, left renal stone scheduled for above procedure 12/11/2019 with Dr. Irine Seal.    H/o DVT s/p ORIF of right trimalleolar ankle fracture 07/11/2019 started on Xarelto 15mg  BID for 6 months.  Pt began experiencing hematuria, Xarelto decreased to 20 mg a day with no further episodes of hematuria.   Pt admitted 6/27-11/22/2019 with sepsis, felt to be due to right ankle cellulitis.  Treated with IV antibiotics and then 10 days of Bactrim as outpatient which he has completed.  Right lower lobe PNA suspected, however chest xray showed bilateral opacities consistent with interstitial lung disease.  He followed up with PCP 12/03/2019, cellulitis to right ankle resolved, doing well.  On PE fine bibasilar crackles right great than left, Dr. Dennard Schaumann concerned for ILD ordered chest xray and CT chest to evaluate.   Per PCP, Dr. Dennard Schaumann, "[name] is medically cleared to proceed with cystoscopy and left ureteroscopy.  He will need to hold xarelto for 48 hours prior to procedure and resume after procedure."  Anticipate pt can proceed with planned procedure barring acute status change.   VS: BP (!) 142/85   Pulse 76   Temp 36.8 C (Oral)   Resp 18   Ht 5\' 11"  (1.803 m)   Wt 103.4 kg   SpO2 94%   BMI 31.80 kg/m   PROVIDERS: [name], MD is PCP    LABS: Labs reviewed: Acceptable for surgery. (all labs ordered are listed, but only abnormal results are displayed)  Labs Reviewed  BASIC METABOLIC PANEL - Abnormal; Notable for the following components:      Result Value   Glucose, Bld 131 (*)    BUN 29 (*)     GFR calc non Af Amer 57 (*)    All other components within normal limits  CBC     IMAGES:   EKG: 12/07/2019 Rate 85 bpm Sinus rhythm with 1st degree A-V block with Premature supraventricular complexes Left axis deviation Right bundle branch block Left anterior fasicular block Abnormal ECG No significant change since last tracing  CV: Echo 02/02/2019  IMPRESSIONS    1. The left ventricle has normal systolic function with an ejection  fraction of 60-65%. The cavity size was normal. There is moderate  concentric left ventricular hypertrophy. Left ventricular diastolic  Doppler parameters are indeterminate. No evidence  of left ventricular regional wall motion abnormalities.  2. No evidence of mitral valve stenosis.  3. The aortic valve is tricuspid. Moderate thickening of the aortic  valve. Mild calcification of the aortic valve. Aortic valve regurgitation  is mild by color flow Doppler. No stenosis of the aortic valve.  4. The aorta is normal unless otherwise noted.  5. The aortic root and aortic arch are normal in size and structure Past Medical History:  Diagnosis Date  . Acute deep vein thrombosis (DVT) of tibial vein of right lower extremity (HCC)    after ankle fracture/surgery (2/21)  . Arthritis    RIGHT HIP, HANDS, BACK  . Blood clot in vein    right lower leg  . Cancer ([place])  Skin cancer   . Cataract   . Cervical radiculopathy    severe C3-4 neuroforaminal stenosis, C5-6 left synovial cyst compressing left nerve root.  . Colon polyps    2005  . COPD (chronic obstructive pulmonary disease) ([place])    SMOKED FOR 38 YRS  . Diverticulosis    2005  . Fatty liver   . GERD (gastroesophageal reflux disease)   . Hard of hearing   . History of kidney stones   . Hypertension   . Leg fracture, right   . Pain    RIGHT HIP AND BACK  . Personal history of colonic polyps-adenoma and sessile serrated polyp 07/15/2010  . Pneumonia   . RBBB    POSS HX AF IN  PAST- PAC'S  . Shortness of breath    WITH EXERTION ONLY  . TIA (transient ischemic attack)   . Wears glasses     Past Surgical History:  Procedure Laterality Date  . COLONOSCOPY     maybe 3   . COLONOSCOPY W/ POLYPECTOMY    . ORIF ANKLE FRACTURE Right 07/11/2019  . ORIF ANKLE FRACTURE Right 07/11/2019   Procedure: OPEN REDUCTION INTERNAL FIXATION (ORIF) ANKLE FRACTURE;  Surgeon: Shona Needles, MD;  Location: [facility];  Service: Orthopedics;  Laterality: Right;  . skin cancers removed     . TOTAL HIP ARTHROPLASTY Right 01/09/2013   Procedure: RIGHT TOTAL HIP ARTHROPLASTY ANTERIOR APPROACH;  Surgeon: [name], MD;  Location[facility];  Service: Orthopedics;  Laterality: Right;    MEDICATIONS: . acetaminophen (TYLENOL) 650 MG CR tablet  . amLODipine (NORVASC) 5 MG tablet  . fish oil-omega-3 fatty acids 1000 MG capsule  . furosemide (LASIX) 20 MG tablet  . meclizine (ANTIVERT) 25 MG tablet  . Multiple Vitamin (MULTIVITAMIN WITH MINERALS) TABS  . pantoprazole (PROTONIX) 40 MG tablet  . Peppermint Oil (IBGARD PO)  . rivaroxaban (XARELTO) 20 MG TABS tablet  . zolpidem (AMBIEN) 10 MG tablet   No current facility-administered medications for this encounter.    Maia Plan [facility] Pre-Surgical Testing [phone] 12/10/19  11:06 AM

## 2019-12-10 NOTE — H&P (Signed)
I have kidney stones.  HPI: Brendan Hopkins is a 84 year-old male patient who is here for renal calculi.  The problem is on the left side. This is his first kidney stone.   Brendan Hopkins is an 84 yo WM who was recently hospitalized with sepsis and a right DVT that was felt to possibly related to his right ankle fracture hardware from February. He had gross hematuria once he was started on a blood thinner and a CT was done which demonstrated bilateral renal stones with a 28mm left renal pelvic stone. He has had no acute flank pain but has some chronic left flank pain that was felt to be colon related on prior evaluation. He has passed stones 6 and 10 years. He has had no stones or UTI's. He is still on bactrim for his recent infection. He had pneumonia as well. he had COVID in 11/20.      CC: AUA Questions Scoring.  HPI:     AUA Symptom Score: Less than 20% of the time he has the sensation of not emptying his bladder completely when finished urinating. Almost always he has to urinate again fewer than two hours after he has finished urinating. Almost always he has to start and stop again several times when he urinates. Almost always he finds it difficult to postpone urination. 50% of the time he has a weak urinary stream. He never has to push or strain to begin urination. He has to get up to urinate 4 times from the time he goes to bed until the time he gets up in the morning.   Calculated AUA Symptom Score: 23    ALLERGIES: None   MEDICATIONS: Amlodipine Besilate  Bactrim  Fish Oil  Furosemide  Ibgard  Meclizine Hcl  Multivitamin  Pantoprazole Sodium  Tylenol  Xarelto  Zolpidem Tartrate     GU PSH: None   NON-GU PSH: Hip Replacement Leg/ankle Surgery Procedure, Right     GU PMH: None   NON-GU PMH: Arthritis GERD Hypertension Skin Cancer, History    FAMILY HISTORY: 1 Daughter - Other 2 sons - Other Cancer - Mother kidney stone - Father    Notes: hemacromatosis-brother    SOCIAL HISTORY: Marital Status: Widowed Current Smoking Status: Patient does not smoke anymore. Has not smoked since 11/21/2005. Smoked for 50 years. Smoked 2 packs per day.   Tobacco Use Assessment Completed: Used Tobacco in last 30 days? Has never drank.  Drinks 3 caffeinated drinks per day. Patient's occupation is/was retired.    REVIEW OF SYSTEMS:    GU Review Male:   Patient reports frequent urination, hard to postpone urination, get up at night to urinate, leakage of urine, stream starts and stops, trouble starting your stream, and erection problems. Patient denies burning/ pain with urination, have to strain to urinate , and penile pain.  Gastrointestinal (Upper):   Patient denies nausea, vomiting, and indigestion/ heartburn.  Gastrointestinal (Lower):   Patient reports constipation. Patient denies diarrhea.  Constitutional:   Patient reports night sweats and fatigue. Patient denies fever and weight loss.  Skin:   Patient denies skin rash/ lesion and itching.  Eyes:   Patient denies blurred vision and double vision.  Ears/ Nose/ Throat:   Patient denies sore throat and sinus problems.  Hematologic/Lymphatic:   Patient reports easy bruising. Patient denies swollen glands.  Cardiovascular:   Patient reports leg swelling. Patient denies chest pains.  Respiratory:   Patient reports cough and shortness of breath.   Endocrine:  Patient denies excessive thirst.  Musculoskeletal:   Patient reports back pain and joint pain.   Neurological:   Patient denies headaches and dizziness.  Psychologic:   Patient denies depression and anxiety.   Notes: hematuria    VITAL SIGNS:      11/28/2019 09:26 AM  Weight 235 lb / 106.59 kg  Height 70 in / 177.8 cm  BP 170/78 mmHg  Heart Rate 97 /min  Temperature 98.2 F / 36.7 C  BMI 33.7 kg/m   MULTI-SYSTEM PHYSICAL EXAMINATION:    Constitutional: Well-nourished. No physical deformities. Normally developed. Good grooming.  Neck: Neck  symmetrical, not swollen. Normal tracheal position.  Respiratory: No labored breathing, no use of accessory muscles.   Cardiovascular: Normal temperature, swelling in lower legs, right > left.  Skin: No paleness, no jaundice, no cyanosis. No lesion, no ulcer, no rash.  Neurologic / Psychiatric: Oriented to time, oriented to place, oriented to person. No depression, no anxiety, no agitation.  Gastrointestinal: Obese abdomen. , no tenderness,   Musculoskeletal: Normal gait and station of head and neck.     Complexity of Data:  Lab Test Review:   BMP  Records Review:   AUA Symptom Score, Previous Hospital Records  Urine Test Review:   Urinalysis, Urine Culture  X-Ray Review: C.T. Hematuria: Reviewed Films. Reviewed Report. Discussed With Patient.    Notes:                     Cx was negative on 6/28. Cr was 1.11 on 7/1.    PROCEDURES: None   ASSESSMENT:      ICD-10 Details  1 GU:   Renal calculus - N20.0 Left, Acute, Systemic Symptoms - Left renal stones with hematuria on Xarelto. He will need ureteroscopy since his DVT is fairly fresh and ESWL would have a high risk of bleeding if he can't come off of the Xarelto. I have reviewed the risks of ureteroscopy including bleeding, infection, ureteral injury, need for a stent or secondary procedures, thrombotic events and anesthetic complications.   2   Gross hematuria - R31.0 Acute, Systemic Symptoms   PLAN:           Schedule Return Visit/Planned Activity: Keep Scheduled Appointment - Schedule Surgery             Note: Left ureteroscopy  Procedure: Unspecified Date - Cysto Uretero Lithotripsy - 88416 Notes: Next available.           Document Letter(s):  Created for Patient: Clinical Summary

## 2019-12-11 ENCOUNTER — Ambulatory Visit (HOSPITAL_COMMUNITY): Payer: Medicare Other

## 2019-12-11 ENCOUNTER — Encounter (HOSPITAL_COMMUNITY)
Admission: RE | Disposition: A | Discharge status: Home / Self Care | Payer: Self-pay | Source: Home / Self Care | Attending: Urology

## 2019-12-11 ENCOUNTER — Ambulatory Visit (HOSPITAL_COMMUNITY): Payer: Medicare Other | Admitting: Physician Assistant

## 2019-12-11 ENCOUNTER — Other Ambulatory Visit: Payer: Self-pay

## 2019-12-11 ENCOUNTER — Encounter (HOSPITAL_COMMUNITY): Payer: Self-pay | Admitting: Urology

## 2019-12-11 ENCOUNTER — Ambulatory Visit (HOSPITAL_COMMUNITY)
Admission: RE | Admit: 2019-12-11 | Discharge: 2019-12-11 | Disposition: A | Discharge status: Home / Self Care | Payer: Medicare Other | Attending: Urology | Admitting: Urology

## 2019-12-11 DIAGNOSIS — E669 Obesity, unspecified: Secondary | ICD-10-CM | POA: Insufficient documentation

## 2019-12-11 DIAGNOSIS — Z86718 Personal history of other venous thrombosis and embolism: Secondary | ICD-10-CM | POA: Diagnosis not present

## 2019-12-11 DIAGNOSIS — Z6831 Body mass index (BMI) 31.0-31.9, adult: Secondary | ICD-10-CM | POA: Insufficient documentation

## 2019-12-11 DIAGNOSIS — I10 Essential (primary) hypertension: Secondary | ICD-10-CM | POA: Diagnosis not present

## 2019-12-11 DIAGNOSIS — Z79899 Other long term (current) drug therapy: Secondary | ICD-10-CM | POA: Insufficient documentation

## 2019-12-11 DIAGNOSIS — Z7901 Long term (current) use of anticoagulants: Secondary | ICD-10-CM | POA: Diagnosis not present

## 2019-12-11 DIAGNOSIS — N200 Calculus of kidney: Secondary | ICD-10-CM | POA: Insufficient documentation

## 2019-12-11 DIAGNOSIS — E785 Hyperlipidemia, unspecified: Secondary | ICD-10-CM | POA: Diagnosis not present

## 2019-12-11 DIAGNOSIS — K219 Gastro-esophageal reflux disease without esophagitis: Secondary | ICD-10-CM | POA: Insufficient documentation

## 2019-12-11 DIAGNOSIS — R310 Gross hematuria: Secondary | ICD-10-CM | POA: Insufficient documentation

## 2019-12-11 DIAGNOSIS — J449 Chronic obstructive pulmonary disease, unspecified: Secondary | ICD-10-CM | POA: Diagnosis not present

## 2019-12-11 DIAGNOSIS — Z87891 Personal history of nicotine dependence: Secondary | ICD-10-CM | POA: Diagnosis not present

## 2019-12-11 HISTORY — PX: CYSTOSCOPY WITH RETROGRADE PYELOGRAM, URETEROSCOPY AND STENT PLACEMENT: SHX5789

## 2019-12-11 SURGERY — CYSTOURETEROSCOPY, WITH RETROGRADE PYELOGRAM AND STENT INSERTION
Anesthesia: General | Laterality: Left

## 2019-12-11 MED ORDER — DEXAMETHASONE SODIUM PHOSPHATE 10 MG/ML IJ SOLN
INTRAMUSCULAR | Status: DC | PRN
Start: 2019-12-11 — End: 2019-12-11
  Administered 2019-12-11: 5 mg via INTRAVENOUS

## 2019-12-11 MED ORDER — SODIUM CHLORIDE 0.9% FLUSH: 3.0000 mL | Freq: Two times a day (BID) | INTRAVENOUS | Status: DC

## 2019-12-11 MED ORDER — LIDOCAINE 2% (20 MG/ML) 5 ML SYRINGE
INTRAMUSCULAR | Status: AC
  Filled 2019-12-11: qty 20

## 2019-12-11 MED ORDER — ACETAMINOPHEN 10 MG/ML IV SOLN: 1000.0000 mg | Freq: Once | INTRAVENOUS | Status: DC | PRN

## 2019-12-11 MED ORDER — HYDROCODONE-ACETAMINOPHEN 5-325 MG PO TABS: 1.0000 | ORAL_TABLET | Freq: Four times a day (QID) | ORAL | 0 refills | Status: DC | PRN

## 2019-12-11 MED ORDER — ORAL CARE MOUTH RINSE: 15.0000 mL | Freq: Once | OROMUCOSAL | Status: AC

## 2019-12-11 MED ORDER — LACTATED RINGERS IV SOLN: INTRAVENOUS | Status: DC

## 2019-12-11 MED ORDER — PROPOFOL 10 MG/ML IV BOLUS
INTRAVENOUS | Status: AC
  Filled 2019-12-11: qty 20

## 2019-12-11 MED ORDER — IOHEXOL 300 MG/ML  SOLN
INTRAMUSCULAR | Status: DC | PRN
  Administered 2019-12-11: 15 mL

## 2019-12-11 MED ORDER — PROPOFOL 10 MG/ML IV BOLUS
INTRAVENOUS | Status: DC | PRN
  Administered 2019-12-11: 140 mg via INTRAVENOUS
  Administered 2019-12-11: 60 mg via INTRAVENOUS

## 2019-12-11 MED ORDER — FENTANYL CITRATE (PF) 100 MCG/2ML IJ SOLN
INTRAMUSCULAR | Status: DC | PRN
  Administered 2019-12-11: 50 ug via INTRAVENOUS
  Administered 2019-12-11 (×2): 25 ug via INTRAVENOUS

## 2019-12-11 MED ORDER — SODIUM CHLORIDE 0.9 % IR SOLN
Status: DC | PRN
  Administered 2019-12-11: 6000 mL

## 2019-12-11 MED ORDER — LIDOCAINE 2% (20 MG/ML) 5 ML SYRINGE
INTRAMUSCULAR | Status: DC | PRN
Start: 2019-12-11 — End: 2019-12-11
  Administered 2019-12-11: 80 mg via INTRAVENOUS

## 2019-12-11 MED ORDER — ONDANSETRON HCL 4 MG/2ML IJ SOLN: 4.0000 mg | Freq: Once | INTRAMUSCULAR | Status: DC | PRN

## 2019-12-11 MED ORDER — DEXAMETHASONE SODIUM PHOSPHATE 10 MG/ML IJ SOLN
INTRAMUSCULAR | Status: AC
  Filled 2019-12-11: qty 1

## 2019-12-11 MED ORDER — FENTANYL CITRATE (PF) 100 MCG/2ML IJ SOLN: 25.0000 ug | INTRAMUSCULAR | Status: DC | PRN

## 2019-12-11 MED ORDER — CEFAZOLIN (ANCEF) 1 G IV SOLR
2.0000 g | INTRAVENOUS | Status: AC
  Administered 2019-12-11: 2 g
  Filled 2019-12-11: qty 2

## 2019-12-11 MED ORDER — PHENYLEPHRINE 40 MCG/ML (10ML) SYRINGE FOR IV PUSH (FOR BLOOD PRESSURE SUPPORT)
PREFILLED_SYRINGE | INTRAVENOUS | Status: DC | PRN
  Administered 2019-12-11: 80 ug via INTRAVENOUS

## 2019-12-11 MED ORDER — ONDANSETRON HCL 4 MG/2ML IJ SOLN
INTRAMUSCULAR | Status: AC
  Filled 2019-12-11: qty 2

## 2019-12-11 MED ORDER — FENTANYL CITRATE (PF) 100 MCG/2ML IJ SOLN
INTRAMUSCULAR | Status: AC
  Filled 2019-12-11: qty 2

## 2019-12-11 MED ORDER — ONDANSETRON HCL 4 MG/2ML IJ SOLN
INTRAMUSCULAR | Status: DC | PRN
  Administered 2019-12-11: 4 mg via INTRAVENOUS

## 2019-12-11 MED ORDER — CEFAZOLIN SODIUM-DEXTROSE 2-4 GM/100ML-% IV SOLN
INTRAVENOUS | Status: AC
  Filled 2019-12-11: qty 100

## 2019-12-11 MED ORDER — CHLORHEXIDINE GLUCONATE 0.12 % MT SOLN
15.0000 mL | Freq: Once | OROMUCOSAL | Status: AC
  Administered 2019-12-11: 15 mL via OROMUCOSAL

## 2019-12-11 SURGICAL SUPPLY — 21 items
BAG URO CATCHER STRL LF (MISCELLANEOUS) ×2 IMPLANT
BASKET STONE NCOMPASS (UROLOGICAL SUPPLIES) IMPLANT
CATH URET 5FR 28IN OPEN ENDED (CATHETERS) IMPLANT
CATH URET DUAL LUMEN 6-10FR 50 (CATHETERS) IMPLANT
CLOTH BEACON ORANGE TIMEOUT ST (SAFETY) ×2 IMPLANT
EXTRACTOR STONE NITINOL NGAGE (UROLOGICAL SUPPLIES) ×2 IMPLANT
FIBER LASER FLEXIVA 1000 (UROLOGICAL SUPPLIES) IMPLANT
FIBER LASER FLEXIVA 365 (UROLOGICAL SUPPLIES) IMPLANT
FIBER LASER FLEXIVA 550 (UROLOGICAL SUPPLIES) IMPLANT
FIBER LASER TRAC TIP (UROLOGICAL SUPPLIES) ×2 IMPLANT
GLOVE SURG SS PI 8.0 STRL IVOR (GLOVE) ×2 IMPLANT
GOWN STRL REUS W/TWL XL LVL3 (GOWN DISPOSABLE) ×2 IMPLANT
GUIDEWIRE STR DUAL SENSOR (WIRE) ×2 IMPLANT
IV NS IRRIG 3000ML ARTHROMATIC (IV SOLUTION) ×2 IMPLANT
KIT TURNOVER KIT A (KITS) IMPLANT
MANIFOLD NEPTUNE II (INSTRUMENTS) ×2 IMPLANT
PACK CYSTO (CUSTOM PROCEDURE TRAY) ×2 IMPLANT
SHEATH URETERAL 12FRX35CM (MISCELLANEOUS) IMPLANT
STENT URET 6FRX24 CONTOUR (STENTS) ×2 IMPLANT
TUBING CONNECTING 10 (TUBING) ×2 IMPLANT
TUBING UROLOGY SET (TUBING) ×2 IMPLANT

## 2019-12-11 NOTE — Transfer of Care (Signed)
Immediate Anesthesia Transfer of Care Note  Patient: Brendan Hopkins  Procedure(s) Performed: CYSTOSCOPY WITH LEFT RETROGRADE LEFT  URETEROSCOPY WITH HOLMIUM LASER AND STENT PLACEMENT (Left )  Patient Location: PACU  Anesthesia Type:General  Level of Consciousness: awake, patient cooperative and responds to stimulation  Airway & Oxygen Therapy: Patient Spontanous Breathing and Patient connected to face mask oxygen  Post-op Assessment: Report given to RN and Post -op Vital signs reviewed and stable  Post vital signs: Reviewed and stable  Last Vitals:  Vitals Value Taken Time  BP 163/82 12/11/19 1501  Temp    Pulse 71 12/11/19 1507  Resp 20 12/11/19 1507  SpO2 100 % 12/11/19 1507  Vitals shown include unvalidated device data.  Last Pain:  Vitals:   12/11/19 1124  TempSrc: Oral  PainSc: 7       Patients Stated Pain Goal: 4 (85/88/50 2774)  Complications: No complications documented.

## 2019-12-11 NOTE — Progress Notes (Signed)
He has mild catheter discomfort but the urine is clear.  BP (!) 153/90   Pulse 77   Temp 97.7 F (36.5 C) (Oral)   Resp 17   Ht 5\' 11"  (1.803 m)   Wt 103.4 kg   SpO2 96%   BMI 31.80 kg/m    Plan to D/C foley in am if urine remains clear.

## 2019-12-11 NOTE — Anesthesia Preprocedure Evaluation (Signed)
Anesthesia Evaluation  Patient identified by MRN, date of birth, ID band Patient awake    Reviewed: Allergy & Precautions, NPO status , Patient's Chart, lab work & pertinent test results  Airway Mallampati: II  TM Distance: >3 FB Neck ROM: Full    Dental no notable dental hx. (+) Edentulous Upper, Edentulous Lower   Pulmonary pneumonia, resolved, COPD, former smoker,    Pulmonary exam normal breath sounds clear to auscultation       Cardiovascular hypertension, Pt. on medications Normal cardiovascular exam Rhythm:Regular Rate:Normal  Echo 02/02/19  1. The left ventricle has normal systolic function with an ejection  fraction of 60-65%. The cavity size was normal. There is moderate  concentric left ventricular hypertrophy. Left ventricular diastolic  Doppler parameters are indeterminate. No evidence  of left ventricular regional wall motion abnormalities.    Neuro/Psych negative neurological ROS  negative psych ROS   GI/Hepatic Neg liver ROS, GERD  Medicated and Controlled,  Endo/Other  negative endocrine ROS  Renal/GU Renal disease     Musculoskeletal   Abdominal   Peds  Hematology Lab Results      Component                Value               Date                      WBC                      6.2                 12/07/2019                HGB                      13.3                12/07/2019                HCT                      41.3                12/07/2019                MCV                      95.8                12/07/2019                PLT                      354                 12/07/2019              Anesthesia Other Findings   Reproductive/Obstetrics                             Anesthesia Physical Anesthesia Plan  ASA: III  Anesthesia Plan: General   Post-op Pain Management:    Induction: Intravenous  PONV Risk Score and Plan: 3 and Treatment may vary due to age  or medical condition, Ondansetron and Dexamethasone  Airway Management Planned: LMA  Additional  Equipment: None  Intra-op Plan:   Post-operative Plan:   Informed Consent: I have reviewed the patients History and Physical, chart, labs and discussed the procedure including the risks, benefits and alternatives for the proposed anesthesia with the patient or authorized representative who has indicated his/her understanding and acceptance.     Dental advisory given  Plan Discussed with: CRNA and Surgeon  Anesthesia Plan Comments:         Anesthesia Quick Evaluation

## 2019-12-11 NOTE — Op Note (Signed)
Procedure: 1.  Cystoscopy with left retrograde pyelogram and interpretation. 2.  Left ureteroscopic stone extraction with holmium laser application and insertion of left double-J stent. 3.  Application of fluoroscopy.  Preop diagnosis: 10 mm left renal pelvic stone.  Postop diagnosis: Same with a duplicated system and the stone in the lower pole moiety.  Surgeon: Dr. Irine Seal.  Anesthesia: General.  Specimen: Stone fragments.  Drain: 6 [place] by 24 cm left contour double-J stent with tether.  EBL: None.  Complications: None.  Indications: The patient is an 84 year old male with a 10 mm left renal pelvic stone and smaller satellite stones who is on Xarelto.  He is to undergo ureteroscopy for management.  Procedure: He was given 2 g of Ancef.  A general anesthetic was induced.  He was placed in lithotomy position and fitted with PAS hose.  His perineum and genitalia were prepped with Betadine solution was draped in usual sterile fashion.  Cystoscopy was performed using a 23 [place] scope and 30 degree lens.  Examination revealed a normal urethra.  There was a mild stricture in the bulb but the scope passed easily.  The external sphincter was intact.  The prostatic urethra had bilobar hyperplasia with some coaptation and mild obstruction.  The bladder wall had mild trabeculation without tumors, stones or inflammation.  The right ureteral orifice was unremarkable.  The left ureteral orifice was duplicated.  Left retrograde pyelogram was performed using a 5 French opening catheter and Omnipaque.  The left retrograde pyelogram of the lower pole moiety revealed a normal ureter with a filling defect in the renal pelvis consistent with his known stone and smaller filling defects in the calyces consistent with stones.  The left retrograde pyelogram of the upper pole moiety required the use of a sensor wire to cannulate the orifice but this demonstrated a normal ureter and intrarenal collecting  system with a small filling defect in the lower pole that appeared to represent a small calculus.  After completion of retrograde pyelography a sensor wire was placed to the lower pole and a 35 cm digital access sheath 12 French inner core was passed over the wire and easily advanced to the proximal ureter.  The assembled sheath was then passed and the wire and ample removed.  The dual-lumen digital flexible scope was passed and the stone was identified in the renal pelvis.  A 200 m tract tip laser fiber was passed and the laser was set on 0.3 J and 50 Hz.  The stone was readily broken into manageable fragments.  The stone fragments were then removed with the engage basket and small satellite stones removed as well.  The final stone removed from the most lateral calyx was moved to the proximal ureter and further fragmented for removal.  Final inspection revealed only small grit and dust in the collecting system.  There is no significant bleeding noted.  A sensor wire was passed through the sheath to the kidney and the sheath was gently removed.  The cystoscope was then reinserted over the wire and a 6 [place] by 24 cm contour double-J stent was passed under fluoroscopic guidance.  The wire was removed, leaving good coil in the kidney and a good coil in the bladder.  The bladder was drained and the cystoscope was removed and the stent string exiting the urethra.  The string was secured to the patient's penis.  He was taken down from lithotomy position, his anesthetic was reversed and he was moved recovery in  stable condition.  Fragments of the stones were given to the patient's family to be brought to the office for analysis.

## 2019-12-11 NOTE — Anesthesia Postprocedure Evaluation (Signed)
Anesthesia Post Note  Patient: GABERIAL CADA  Procedure(s) Performed: CYSTOSCOPY WITH LEFT RETROGRADE LEFT  URETEROSCOPY WITH HOLMIUM LASER AND STENT PLACEMENT (Left )     Patient location during evaluation: PACU Anesthesia Type: General Level of consciousness: awake and alert Pain management: pain level controlled Vital Signs Assessment: post-procedure vital signs reviewed and stable Respiratory status: spontaneous breathing, nonlabored ventilation, respiratory function stable and patient connected to nasal cannula oxygen Cardiovascular status: blood pressure returned to baseline and stable Postop Assessment: no apparent nausea or vomiting Anesthetic complications: no   No complications documented.  Last Vitals:  Vitals:   12/11/19 1124 12/11/19 1501  BP: (!) 153/90 (!) 163/82  Pulse: 77 75  Resp: 17 (!) 21  Temp: 36.5 C 36.8 C  SpO2: 96% 98%    Last Pain:  Vitals:   12/11/19 1501  TempSrc:   PainSc: 0-No pain                 Barnet Glasgow

## 2019-12-11 NOTE — Interval H&P Note (Signed)
History and Physical Interval Note:  12/11/2019 1:13 PM  Brendan Hopkins  has presented today for surgery, with the diagnosis of LEFT RENAL STONE.  The various methods of treatment have been discussed with the patient and family. After consideration of risks, benefits and other options for treatment, the patient has consented to  Procedure(s): CYSTOSCOPY WITH LEFT RETROGRADE LEFT  URETEROSCOPY WITH HOLMIUM LASER AND STENT PLACEMENT (Left) as a surgical intervention.  The patient's history has been reviewed, patient examined, no change in status, stable for surgery.  I have reviewed the patient's chart and labs.  Questions were answered to the patient's satisfaction.     Irine Seal

## 2019-12-11 NOTE — Anesthesia Procedure Notes (Signed)
Procedure Name: LMA Insertion Date/Time: 12/11/2019 1:39 PM Performed by: Silas Sacramento, CRNA Pre-anesthesia Checklist: Patient identified, Emergency Drugs available, Suction available and Patient being monitored Patient Re-evaluated:Patient Re-evaluated prior to induction Oxygen Delivery Method: Circle system utilized Preoxygenation: Pre-oxygenation with 100% oxygen Induction Type: IV induction LMA: LMA with gastric port inserted LMA Size: 5.0 Tube type: Oral Number of attempts: 1 Placement Confirmation: positive ETCO2 Tube secured with: Tape Dental Injury: Teeth and Oropharynx as per pre-operative assessment

## 2019-12-11 NOTE — Discharge Instructions (Signed)
Ureteral Stent Implantation, Care After This sheet gives you information about how to care for yourself after your procedure. Your health care provider may also give you more specific instructions. If you have problems or questions, contact your health care provider. What can I expect after the procedure? After the procedure, it is common to have:  Nausea.  Mild pain when you urinate. You may feel this pain in your lower back or lower abdomen. The pain should stop within a few minutes after you urinate. This may last for up to 1 week.  A small amount of blood in your urine for several days. Follow these instructions at home: Medicines  Take over-the-counter and prescription medicines only as told by your health care provider.  If you were prescribed an antibiotic medicine, take it as told by your health care provider. Do not stop taking the antibiotic even if you start to feel better.  Do not drive for 24 hours if you were given a sedative during your procedure.  Ask your health care provider if the medicine prescribed to you requires you to avoid driving or using heavy machinery. Activity  Rest as told by your health care provider.  Avoid sitting for a long time without moving. Get up to take short walks every 1-2 hours. This is important to improve blood flow and breathing. Ask for help if you feel weak or unsteady.  Return to your normal activities as told by your health care provider. Ask your health care provider what activities are safe for you. General instructions   Watch for any blood in your urine. Call your health care provider if the amount of blood in your urine increases.  If you have a catheter: ? Follow instructions from your health care provider about taking care of your catheter and collection bag. ? Do not take baths, swim, or use a hot tub until your health care provider approves. Ask your health care provider if you may take showers. You may only be allowed to  take sponge baths.  Drink enough fluid to keep your urine pale yellow.  Do not use any products that contain nicotine or tobacco, such as cigarettes, e-cigarettes, and chewing tobacco. These can delay healing after surgery. If you need help quitting, ask your health care provider.  Keep all follow-up visits as told by your health care provider. This is important. Contact a health care provider if:  You have pain that gets worse or does not get better with medicine, especially pain when you urinate.  You have difficulty urinating.  You feel nauseous or you vomit repeatedly during a period of more than 2 days after the procedure. Get help right away if:  Your urine is dark red or has blood clots in it.  You are leaking urine (have incontinence).  The end of the stent comes out of your urethra.  You cannot urinate.  You have sudden, sharp, or severe pain in your abdomen or lower back.  You have a fever.  You have swelling or pain in your legs.  You have difficulty breathing. Summary  After the procedure, it is common to have mild pain when you urinate that goes away within a few minutes after you urinate. This may last for up to 1 week.  Watch for any blood in your urine. Call your health care provider if the amount of blood in your urine increases.  Take over-the-counter and prescription medicines only as told by your health care provider.  Drink   enough fluid to keep your urine pale yellow.  You may remove the stent by pulling the string on Friday morning.  If you don't feel you can do that, please call the office to have it done.  This information is not intended to replace advice given to you by your health care provider. Make sure you discuss any questions you have with your health care provider. Document Revised: 02/14/2018 Document Reviewed: 02/15/2018 Elsevier Patient Education  2020 [employer].

## 2019-12-12 ENCOUNTER — Telehealth: Payer: Self-pay | Admitting: Family Medicine

## 2019-12-12 ENCOUNTER — Encounter (HOSPITAL_COMMUNITY): Payer: Self-pay | Admitting: Urology

## 2019-12-12 NOTE — Telephone Encounter (Signed)
Call placed to patient.   Reports that he had cystoscopy and stone removal on 12/11/2019 and was advised to hold all medications x2 days.   Agreeable to plan.

## 2019-12-12 NOTE — Telephone Encounter (Signed)
CB#  [phone] Pt was advise by Dr.Wreen to stop taking high blood p[resure medication also Aspirin for two days also to advise Dr.Pickard pt not to sure what to do

## 2019-12-17 ENCOUNTER — Telehealth: Payer: Self-pay | Admitting: Family Medicine

## 2019-12-17 NOTE — Telephone Encounter (Signed)
CB# [phone] Pt need a referral sent [place] for  [place] of his right leg  blood clots. Also would like to speak with the nurse about his blood pressure

## 2019-12-18 ENCOUNTER — Ambulatory Visit
Admission: RE | Admit: 2019-12-18 | Discharge: 2019-12-18 | Disposition: A | Discharge status: Home / Self Care | Payer: Medicare Other | Source: Ambulatory Visit | Attending: Family Medicine | Admitting: Family Medicine

## 2019-12-18 DIAGNOSIS — J849 Interstitial pulmonary disease, unspecified: Secondary | ICD-10-CM

## 2019-12-18 DIAGNOSIS — J449 Chronic obstructive pulmonary disease, unspecified: Secondary | ICD-10-CM | POA: Diagnosis not present

## 2019-12-24 ENCOUNTER — Other Ambulatory Visit: Payer: Self-pay

## 2019-12-25 DIAGNOSIS — N200 Calculus of kidney: Secondary | ICD-10-CM | POA: Diagnosis not present

## 2019-12-26 ENCOUNTER — Telehealth: Payer: Self-pay

## 2019-12-26 NOTE — Telephone Encounter (Signed)
Pt called for labs, and to express how well he felt after his kidney stones were removed.  Then he went on to express that the lady, black lady that's in the front that dress like she's the pretties thing in the world, dresses like she going on a dinner date or something. Someone needs to take here out back behind the woods and put a strife to her legs, and said this twice. And went on to say he started to make an appt one day just to make one saying it's about his toe, just so he could come up here and let her have it.  He mentioned Sharyn Lull left him on hold for twenty minutes, so he got in his car drove up here, and not a soul was in the waiting area. At the time he called it letting off to me, and I told him how the front clinical receptionist has been very busy and nothing was done out of harm.

## 2019-12-27 NOTE — Telephone Encounter (Signed)
Please see the phone note to address.   1) I am getting a tone of complaints about no one answering the phone here (I have a list and just stopped writing them down at one point because its obviously a problem) we need to discuss this  2) Specific to this call, I would be glad to discuss his comments at our meeting to discuss how this should be handled.

## 2020-01-01 ENCOUNTER — Other Ambulatory Visit: Payer: Self-pay | Admitting: Family Medicine

## 2020-01-01 MED ORDER — ZOLPIDEM TARTRATE 10 MG PO TABS: 10.0000 mg | ORAL_TABLET | Freq: Every evening | ORAL | 1 refills | Status: DC | PRN

## 2020-01-01 NOTE — Telephone Encounter (Signed)
Patient called asking for a refill on his zolpidem he would like this called into [facility] at [facility].  He also would like to know if he needs to continue Xarelto, he thought he was only supposed to stay on this medication for 6 months.  He also would like to know if he needs to continue his furosemide this was for his leg swelling and it is still swelling.  CB# [phone]

## 2020-01-01 NOTE — Telephone Encounter (Signed)
1. Ok to refill Ambien?? Last office visit 12/03/2019. Last refill 10/12/2019, #1 refill.   2. Brendan Hopkins began 08/03/2019 for DVT. Plan at that time: Xarelto 15 bid for 21 days then 20 mg poqday for a total of 6 months. Please advise.   3. Lasix began in hospital for edema. Ok to continue?

## 2020-01-02 ENCOUNTER — Other Ambulatory Visit: Payer: Self-pay

## 2020-01-03 ENCOUNTER — Other Ambulatory Visit: Payer: Self-pay

## 2020-01-03 DIAGNOSIS — M7989 Other specified soft tissue disorders: Secondary | ICD-10-CM

## 2020-01-03 MED ORDER — AMLODIPINE BESYLATE 5 MG PO TABS: 5.0000 mg | ORAL_TABLET | Freq: Every day | ORAL | 0 refills | Status: DC

## 2020-01-03 MED ORDER — FUROSEMIDE 20 MG PO TABS: 20.0000 mg | ORAL_TABLET | ORAL | 0 refills | Status: DC

## 2020-01-04 ENCOUNTER — Other Ambulatory Visit: Payer: Self-pay

## 2020-01-04 MED ORDER — RIVAROXABAN 20 MG PO TABS: 20.0000 mg | ORAL_TABLET | Freq: Every day | ORAL | 4 refills | Status: DC

## 2020-01-11 ENCOUNTER — Other Ambulatory Visit: Payer: Self-pay

## 2020-01-11 MED ORDER — HYDROCHLOROTHIAZIDE 25 MG PO TABS: 25.0000 mg | ORAL_TABLET | Freq: Every day | ORAL | 1 refills | Status: DC

## 2020-02-06 ENCOUNTER — Telehealth: Payer: Self-pay | Admitting: Family Medicine

## 2020-02-06 NOTE — Telephone Encounter (Signed)
CB# [phone] Was told to let Dr.Pickard know when  finish  his Blood clot medication today was last day wasn't sure what to do next ? Dr.Pickard mention a Ultrasound. Please advise what's the next step

## 2020-02-11 NOTE — Telephone Encounter (Signed)
He can stop the xarelto.  Does not require ultrasound having finished 6 months.  Would only have done [place] if we had to stop early due to bleeding.

## 2020-02-20 ENCOUNTER — Telehealth: Payer: Self-pay | Admitting: Family Medicine

## 2020-02-20 NOTE — Telephone Encounter (Signed)
Patient is aware of the xarelto and [place] recommendation.

## 2020-02-20 NOTE — Telephone Encounter (Signed)
Patient stopped by office he would like to know if he should go back on aspirin since he is no longer taking xarelto. He did make an appt for next Tuesday for a CPE- he states that his legs have been swelling since he stopped the xarelto.  CB# [phone]  He states that we can leave vm if he doesn't answer with the recommendations from Dr. Dennard Schaumann.

## 2020-02-21 ENCOUNTER — Other Ambulatory Visit: Payer: Self-pay

## 2020-02-21 ENCOUNTER — Ambulatory Visit (INDEPENDENT_AMBULATORY_CARE_PROVIDER_SITE_OTHER): Payer: Medicare Other

## 2020-02-21 DIAGNOSIS — Z23 Encounter for immunization: Secondary | ICD-10-CM

## 2020-02-21 NOTE — Telephone Encounter (Signed)
I would take aspirin 81 mg a day

## 2020-02-21 NOTE — Telephone Encounter (Signed)
Patient is aware of recommendations from Dr. Dennard Schaumann to go ahead and start taking the aspirin 81 mg daily.

## 2020-02-26 ENCOUNTER — Ambulatory Visit (INDEPENDENT_AMBULATORY_CARE_PROVIDER_SITE_OTHER): Payer: Medicare Other | Admitting: Family Medicine

## 2020-02-26 ENCOUNTER — Other Ambulatory Visit: Payer: Self-pay

## 2020-02-26 VITALS — BP 140/80 | HR 72 | Temp 97.8°F | Ht 71.0 in | Wt 243.0 lb

## 2020-02-26 DIAGNOSIS — Z0000 Encounter for general adult medical examination without abnormal findings: Secondary | ICD-10-CM

## 2020-02-26 DIAGNOSIS — Z86718 Personal history of other venous thrombosis and embolism: Secondary | ICD-10-CM | POA: Diagnosis not present

## 2020-02-26 DIAGNOSIS — I10 Essential (primary) hypertension: Secondary | ICD-10-CM | POA: Diagnosis not present

## 2020-02-26 DIAGNOSIS — Z0001 Encounter for general adult medical examination with abnormal findings: Secondary | ICD-10-CM

## 2020-02-26 DIAGNOSIS — M7989 Other specified soft tissue disorders: Secondary | ICD-10-CM

## 2020-02-26 DIAGNOSIS — Z9889 Other specified postprocedural states: Secondary | ICD-10-CM

## 2020-02-26 DIAGNOSIS — Z8781 Personal history of (healed) traumatic fracture: Secondary | ICD-10-CM

## 2020-02-26 MED ORDER — VALSARTAN 80 MG PO TABS
80.0000 mg | ORAL_TABLET | Freq: Every day | ORAL | 3 refills | Status: DC
Start: 2020-02-26 — End: 2020-07-02

## 2020-02-26 MED ORDER — FUROSEMIDE 40 MG PO TABS: 40.0000 mg | ORAL_TABLET | Freq: Every day | ORAL | 3 refills | Status: DC

## 2020-02-26 NOTE — Progress Notes (Signed)
Subjective:    Patient ID: Brendan Hopkins, male    DOB: 1935/09/09, 84 y.o.   MRN: [id number]  HPI Patient is here today for complete physical exam. He denies any concerns.  His colonoscopy was last performed in 2015.  Due to his age they do not recommend a repeat colonoscopy.  Also due to his age I would not recommend a PSA.  Patient denies any complaints or concerns regarding his prostate.  He denies any blood in his stool or melena.  He denies any issues with depression or memory loss or trouble performing his ADLs.  Patient has completed 6 months of Xarelto for his DVT.  He is no longer taking Xarelto but he is back on his aspirin.  However he does have +1 pitting edema in both legs distal to the knee.  This is sometimes causing his legs to ache and throb.  He is taking Lasix 20 mg every other day but he is also on hydrochlorothiazide 25 mg a day.  His blood pressure has been elevated at home with systolic blood pressures between 140 and 150.  He denies any chest pain shortness of breath or dyspnea on exertion.  His immunization records are included below.: Immunization History  Administered Date(s) Administered  . Fluad Quad(high Dose 65+) 01/25/2019, 02/21/2020  . Influenza Split 02/15/2011  . Influenza, High Dose Seasonal PF 02/17/2018  . Influenza,inj,Quad PF,6+ Mos 04/09/2013, 02/12/2014, 03/14/2015, 03/04/2016, 03/02/2017  . Pneumococcal Conjugate-13 04/09/2013  . Pneumococcal Polysaccharide-23 12/19/2014  . Td 09/10/2003   Patient has had both doses of his Covid vaccine.  He had Pfizer.  He is due for his booster in mid November  Past Medical History:  Diagnosis Date  . Acute deep vein thrombosis (DVT) of tibial vein of right lower extremity (HCC)    after ankle fracture/surgery (2/21)  . Arthritis    RIGHT HIP, HANDS, BACK  . Blood clot in vein    right lower leg  . Cancer [facility])    Skin cancer   . Cataract   . Cervical radiculopathy    severe C3-4 neuroforaminal  stenosis, C5-6 left synovial cyst compressing left nerve root.  . Colon polyps    2005  . COPD (chronic obstructive pulmonary disease) ([place])    SMOKED FOR 35 YRS  . Diverticulosis    2005  . Fatty liver   . GERD (gastroesophageal reflux disease)   . Hard of hearing   . History of kidney stones   . Hypertension   . Leg fracture, right   . Pain    RIGHT HIP AND BACK  . Personal history of colonic polyps-adenoma and sessile serrated polyp 07/15/2010  . Pneumonia   . RBBB    POSS HX AF IN PAST- PAC'S  . Shortness of breath    WITH EXERTION ONLY  . TIA (transient ischemic attack)   . Wears glasses    Past Surgical History:  Procedure Laterality Date  . COLONOSCOPY     maybe 3   . COLONOSCOPY W/ POLYPECTOMY    . CYSTOSCOPY WITH RETROGRADE PYELOGRAM, URETEROSCOPY AND STENT PLACEMENT Left 12/11/2019   Procedure: CYSTOSCOPY WITH LEFT RETROGRADE LEFT  URETEROSCOPY WITH HOLMIUM LASER AND STENT PLACEMENT;  Surgeon: Irine Seal, MD;  Location: [facility];  Service: Urology;  Laterality: Left;  . ORIF ANKLE FRACTURE Right 07/11/2019  . ORIF ANKLE FRACTURE Right 07/11/2019   Procedure: OPEN REDUCTION INTERNAL FIXATION (ORIF) ANKLE FRACTURE;  Surgeon: Shona Needles, MD;  Location: [facility]  OR;  Service: Orthopedics;  Laterality: Right;  . skin cancers removed     . TOTAL HIP ARTHROPLASTY Right 01/09/2013   Procedure: RIGHT TOTAL HIP ARTHROPLASTY ANTERIOR APPROACH;  Surgeon: [name], MD;  Location: [facility];  Service: Orthopedics;  Laterality: Right;   Current Outpatient Medications on File Prior to Visit  Medication Sig Dispense Refill  . acetaminophen (TYLENOL) 650 MG CR tablet Take 650 mg by mouth every 8 (eight) hours as needed for pain.    Marland Kitchen amLODipine (NORVASC) 5 MG tablet Take 1 tablet (5 mg total) by mouth at bedtime. 90 tablet 0  . fish oil-omega-3 fatty acids 1000 MG capsule Take 1 g by mouth daily.    . hydrochlorothiazide (HYDRODIURIL) 25 MG tablet Take 1 tablet (25 mg total) by mouth  daily. 90 tablet 1  . HYDROcodone-acetaminophen (NORCO/VICODIN) 5-325 MG tablet Take 1 tablet by mouth every 6 (six) hours as needed for moderate pain. 15 tablet 0  . Multiple Vitamin (MULTIVITAMIN WITH MINERALS) TABS Take 1 tablet by mouth daily.    . pantoprazole (PROTONIX) 40 MG tablet Take 1 tablet (40 mg total) by mouth daily. 90 tablet 3  . Peppermint Oil (IBGARD PO) Take 2 capsules by mouth See admin instructions. Take 2 capsules in the morning, at lunch, late after noon and bed time and in middle of the night for a total of 10 tablets    . meclizine (ANTIVERT) 25 MG tablet TAKE ONE TABLET BY MOUTH THREE TIMES DAILY AS NEEDED FOR DIZZINESS (Patient not taking: Reported on 02/26/2020) 30 tablet 2  . rivaroxaban (XARELTO) 20 MG TABS tablet Take 1 tablet (20 mg total) by mouth daily with supper. (Patient not taking: Reported on 02/26/2020) 30 tablet 4  . zolpidem (AMBIEN) 10 MG tablet Take 1 tablet (10 mg total) by mouth at bedtime as needed for sleep. 15 tablet 1   No current facility-administered medications on file prior to visit.   No Known Allergies Social History   Socioeconomic History  . Marital status: Widowed    Spouse name: Not on file  . Number of children: 2  . Years of education: 45  . Highest education level: Not on file  Occupational History  . Occupation: Retired  Tobacco Use  . Smoking status: Former Smoker    Years: 57.00    Types: Cigarettes    Quit date: 05/25/2007    Years since quitting: 12.7  . Smokeless tobacco: Never Used  Vaping Use  . Vaping Use: Never used  Substance and Sexual Activity  . Alcohol use: No    Alcohol/week: 0.0 standard drinks  . Drug use: No  . Sexual activity: Not on file  Other Topics Concern  . 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 Difficulty of Paying Living Expenses: Not on file  Food  Insecurity:   . Worried About Charity fundraiser in the Last Year: Not on file  . Ran Out of Food in the Last Year: Not on file  Transportation Needs:   . Lack of Transportation (Medical): Not on file  . Lack of Transportation (Non-Medical): Not on file  Physical Activity:   . Days of Exercise per Week: Not on file  . Minutes of Exercise per Session: Not on file  Stress:   . Feeling of Stress : Not on  file  Social Connections:   . Frequency of Communication with Friends and Family: Not on file  . Frequency of Social Gatherings with Friends and Family: Not on file  . Attends Religious Services: Not on file  . Active Member of Clubs or Organizations: Not on file  . Attends Archivist Meetings: Not on file  . Marital Status: Not on file  Intimate Partner Violence:   . Fear of Current or Ex-Partner: Not on file  . Emotionally Abused: Not on file  . Physically Abused: Not on file  . Sexually Abused: Not on file   Family History  Problem Relation Age of Onset  . Cancer Father   . Cancer Mother        died more of old age at age 33  . Colon cancer Neg Hx   . Esophageal cancer Neg Hx   . Prostate cancer Neg Hx   . Rectal cancer Neg Hx   . Stomach cancer Neg Hx       Review of Systems  All other systems reviewed and are negative.      Objective:   Physical Exam Vitals reviewed.  Constitutional:      General: He is not in acute distress.    Appearance: He is well-developed. He is not diaphoretic.  HENT:     Head: Normocephalic and atraumatic.     Right Ear: External ear normal.     Left Ear: External ear normal.     Nose: Nose normal.     Mouth/Throat:     Pharynx: No oropharyngeal exudate.  Eyes:     General: No scleral icterus.       Right eye: No discharge.        Left eye: No discharge.     Conjunctiva/sclera: Conjunctivae normal.     Pupils: Pupils are equal, round, and reactive to light.  Neck:     Thyroid: No thyromegaly.     Vascular: No JVD.       Trachea: No tracheal deviation.  Cardiovascular:     Rate and Rhythm: Normal rate and regular rhythm.     Heart sounds: Normal heart sounds. No murmur heard.  No friction rub. No gallop.   Pulmonary:     Effort: Pulmonary effort is normal. No respiratory distress.     Breath sounds: Normal breath sounds. No stridor. No wheezing or rales.  Chest:     Chest wall: No tenderness.  Abdominal:     General: Bowel sounds are normal. There is no distension.     Palpations: Abdomen is soft. There is no mass.     Tenderness: There is no abdominal tenderness. There is no guarding or rebound.  Musculoskeletal:        General: No tenderness. Normal range of motion.     Cervical back: Normal range of motion and neck supple.     Right lower leg: Edema present.     Left lower leg: Edema present.  Lymphadenopathy:     Cervical: No cervical adenopathy.  Skin:    General: Skin is warm.     Coloration: Skin is not pale.     Findings: No erythema or rash.  Neurological:     Mental Status: He is alert and oriented to person, place, and time.     Cranial Nerves: No cranial nerve deficit.     Motor: No abnormal muscle tone.     Coordination: Coordination normal.     Deep Tendon Reflexes: Reflexes  are normal and symmetric.  Psychiatric:        Behavior: Behavior normal.        Thought Content: Thought content normal.        Judgment: Judgment normal.         Assessment & Plan:  General medical exam  History of DVT (deep vein thrombosis)  Benign essential HTN  Status post ORIF of fracture of ankle  Swelling of both lower extremities  I will stop the patient's hydrochlorothiazide.  Due to the swelling in his legs I will increase his Lasix to 40 mg a day.  Because I am stopping the hydrochlorothiazide I will start valsartan 80 mg a day in addition to his amlodipine for hypertension.  I will see the patient back in 2 weeks to recheck his blood pressure and monitor the swelling in his legs.   At that time I encouraged him to come in fasting so that we can check a CBC, CMP, fasting lipid panel to complete his physical exam.  His immunizations including his flu shot are up-to-date.  Cancer screening is up-to-date given his age.  Patient denies any problems with falls, memory loss, or depression

## 2020-02-27 DIAGNOSIS — L814 Other melanin hyperpigmentation: Secondary | ICD-10-CM | POA: Diagnosis not present

## 2020-02-27 DIAGNOSIS — K130 Diseases of lips: Secondary | ICD-10-CM | POA: Diagnosis not present

## 2020-02-27 DIAGNOSIS — D485 Neoplasm of uncertain behavior of skin: Secondary | ICD-10-CM | POA: Diagnosis not present

## 2020-02-27 DIAGNOSIS — D225 Melanocytic nevi of trunk: Secondary | ICD-10-CM | POA: Diagnosis not present

## 2020-02-27 DIAGNOSIS — R238 Other skin changes: Secondary | ICD-10-CM | POA: Diagnosis not present

## 2020-02-27 DIAGNOSIS — L905 Scar conditions and fibrosis of skin: Secondary | ICD-10-CM | POA: Diagnosis not present

## 2020-02-27 DIAGNOSIS — C4442 Squamous cell carcinoma of skin of scalp and neck: Secondary | ICD-10-CM | POA: Diagnosis not present

## 2020-02-27 DIAGNOSIS — L570 Actinic keratosis: Secondary | ICD-10-CM | POA: Diagnosis not present

## 2020-02-28 ENCOUNTER — Other Ambulatory Visit: Payer: Self-pay | Admitting: Family Medicine

## 2020-02-28 MED ORDER — ZOLPIDEM TARTRATE 10 MG PO TABS: 10.0000 mg | ORAL_TABLET | Freq: Every evening | ORAL | 1 refills | Status: DC | PRN

## 2020-02-28 NOTE — Telephone Encounter (Signed)
Patient requesting a refill on his zolpidem 10 mg he would like to know if a month's supply could be called in for him.  CB# [phone]

## 2020-02-28 NOTE — Telephone Encounter (Signed)
Ok to refill??  Last office visit 02/26/2020.  Last refill 01/01/2020, #1 refill.   Ok to increase to 30 tabs?

## 2020-03-11 ENCOUNTER — Encounter: Payer: Self-pay | Admitting: Family Medicine

## 2020-03-11 ENCOUNTER — Ambulatory Visit (INDEPENDENT_AMBULATORY_CARE_PROVIDER_SITE_OTHER): Payer: Medicare Other | Admitting: Family Medicine

## 2020-03-11 ENCOUNTER — Other Ambulatory Visit: Payer: Self-pay

## 2020-03-11 VITALS — BP 144/60 | HR 60 | Temp 98.2°F | Ht 71.0 in | Wt 244.6 lb

## 2020-03-11 DIAGNOSIS — I10 Essential (primary) hypertension: Secondary | ICD-10-CM | POA: Diagnosis not present

## 2020-03-11 NOTE — Progress Notes (Signed)
Subjective:    Patient ID: Brendan Hopkins, male    DOB: 1936-05-22, 84 y.o.   MRN: [id number]  HPI At the patient's last visit we discontinued hydrochlorothiazide.  We started him on Lasix 40 mg a day due to leg swelling.  We also added valsartan 80 mg a day for hypertension.  Blood pressure today is acceptable at 725 systolic.  He denies any chest pain or shortness of breath.  The swelling in his legs have improved dramatically.  Although he still has trace bipedal edema he is comfortable with this amount of swelling Past Medical History:  Diagnosis Date  . Acute deep vein thrombosis (DVT) of tibial vein of right lower extremity (HCC)    after ankle fracture/surgery (2/21)  . Arthritis    RIGHT HIP, HANDS, BACK  . Blood clot in vein    right lower leg  . Cancer [facility])    Skin cancer   . Cataract   . Cervical radiculopathy    severe C3-4 neuroforaminal stenosis, C5-6 left synovial cyst compressing left nerve root.  . Colon polyps    2005  . COPD (chronic obstructive pulmonary disease) ([place])    SMOKED FOR 59 YRS  . Diverticulosis    2005  . Fatty liver   . GERD (gastroesophageal reflux disease)   . Hard of hearing   . History of kidney stones   . Hypertension   . Leg fracture, right   . Pain    RIGHT HIP AND BACK  . Personal history of colonic polyps-adenoma and sessile serrated polyp 07/15/2010  . Pneumonia   . RBBB    POSS HX AF IN PAST- PAC'S  . Shortness of breath    WITH EXERTION ONLY  . TIA (transient ischemic attack)   . Wears glasses    Past Surgical History:  Procedure Laterality Date  . COLONOSCOPY     maybe 3   . COLONOSCOPY W/ POLYPECTOMY    . CYSTOSCOPY WITH RETROGRADE PYELOGRAM, URETEROSCOPY AND STENT PLACEMENT Left 12/11/2019   Procedure: CYSTOSCOPY WITH LEFT RETROGRADE LEFT  URETEROSCOPY WITH HOLMIUM LASER AND STENT PLACEMENT;  Surgeon: Irine Seal, MD;  Location: [facility];  Service: Urology;  Laterality: Left;  . ORIF ANKLE FRACTURE Right 07/11/2019   . ORIF ANKLE FRACTURE Right 07/11/2019   Procedure: OPEN REDUCTION INTERNAL FIXATION (ORIF) ANKLE FRACTURE;  Surgeon: Shona Needles, MD;  Location: [facility];  Service: Orthopedics;  Laterality: Right;  . skin cancers removed     . TOTAL HIP ARTHROPLASTY Right 01/09/2013   Procedure: RIGHT TOTAL HIP ARTHROPLASTY ANTERIOR APPROACH;  Surgeon: [name], MD;  Location: [facility];  Service: Orthopedics;  Laterality: Right;   Current Outpatient Medications on File Prior to Visit  Medication Sig Dispense Refill  . acetaminophen (TYLENOL) 650 MG CR tablet Take 650 mg by mouth every 8 (eight) hours as needed for pain.    Marland Kitchen amLODipine (NORVASC) 5 MG tablet Take 1 tablet (5 mg total) by mouth at bedtime. 90 tablet 0  . fish oil-omega-3 fatty acids 1000 MG capsule Take 1 g by mouth daily.    . furosemide (LASIX) 40 MG tablet Take 1 tablet (40 mg total) by mouth daily. 30 tablet 3  . HYDROcodone-acetaminophen (NORCO/VICODIN) 5-325 MG tablet Take 1 tablet by mouth every 6 (six) hours as needed for moderate pain. 15 tablet 0  . meclizine (ANTIVERT) 25 MG tablet TAKE ONE TABLET BY MOUTH THREE TIMES DAILY AS NEEDED FOR DIZZINESS 30 tablet 2  .  Multiple Vitamin (MULTIVITAMIN WITH MINERALS) TABS Take 1 tablet by mouth daily.    . pantoprazole (PROTONIX) 40 MG tablet Take 1 tablet (40 mg total) by mouth daily. 90 tablet 3  . Peppermint Oil (IBGARD PO) Take 2 capsules by mouth See admin instructions. Take 2 capsules in the morning, at lunch, late after noon and bed time and in middle of the night for a total of 10 tablets    . valsartan (DIOVAN) 80 MG tablet Take 1 tablet (80 mg total) by mouth daily. 30 tablet 3  . zolpidem (AMBIEN) 10 MG tablet Take 1 tablet (10 mg total) by mouth at bedtime as needed for sleep. 30 tablet 1   No current facility-administered medications on file prior to visit.   No Known Allergies Social History   Socioeconomic History  . Marital status: Widowed    Spouse name: Not on file   . Number of children: 2  . Years of education: 42  . Highest education level: Not on file  Occupational History  . Occupation: Retired  Tobacco Use  . Smoking status: Former Smoker    Years: 57.00    Types: Cigarettes    Quit date: 05/25/2007    Years since quitting: 12.8  . Smokeless tobacco: Never Used  Vaping Use  . Vaping Use: Never used  Substance and Sexual Activity  . Alcohol use: No    Alcohol/week: 0.0 standard drinks  . Drug use: No  . Sexual activity: Not on file  Other Topics Concern  . 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 Difficulty of Paying Living Expenses: Not on file  Food Insecurity:   . Worried About Charity fundraiser in the Last Year: Not on file  . Ran Out of Food in the Last Year: Not on file  Transportation Needs:   . Lack of Transportation (Medical): Not on file  . Lack of Transportation (Non-Medical): Not on file  Physical Activity:   . Days of Exercise per Week: Not on file  . Minutes of Exercise per Session: Not on file  Stress:   . Feeling of Stress : Not on file  Social Connections:   . Frequency of Communication with Friends and Family: Not on file  . Frequency of Social Gatherings with Friends and Family: Not on file  . Attends Religious Services: Not on file  . Active Member of Clubs or Organizations: Not on file  . Attends Archivist Meetings: Not on file  . Marital Status: Not on file  Intimate Partner Violence:   . Fear of Current or Ex-Partner: Not on file  . Emotionally Abused: Not on file  . Physically Abused: Not on file  . Sexually Abused: Not on file    Review of Systems  All other systems reviewed and are negative.      Objective:   Physical Exam Vitals reviewed.  Constitutional:      General: He is not in acute distress.    Appearance: Normal appearance. He is normal  weight. He is not ill-appearing or toxic-appearing.  Cardiovascular:     Rate and Rhythm: Normal rate and regular rhythm.     Heart sounds: Normal heart sounds.  Pulmonary:     Effort: Pulmonary effort is normal. No respiratory distress.     Breath sounds: No stridor. No  wheezing, rhonchi or rales.  Chest:     Chest wall: No tenderness.  Musculoskeletal:     Right lower leg: Edema present.     Left lower leg: Edema present.  Skin:    Findings: No erythema or rash.  Neurological:     Mental Status: He is alert.           Assessment & Plan:    Benign essential HTN - Plan: BASIC METABOLIC PANEL WITH GFR  Swelling has improved although it is still present however he is comfortable staying on Lasix 40 mg a day.  I gave him the option of discontinuing amlodipine to help with the swelling and increasing his valsartan to 160 mg a day.  He prefers to leave his medication list alone for the time being and we will simply monitor the situation over the next 2 weeks.  He will let me know if he changes his mind.  I will check a BMP today to monitor his renal function and potassium after the addition of Lasix.

## 2020-03-11 NOTE — Patient Instructions (Signed)
° ° ° °  If you have lab work done today you will be contacted with your lab results within the next 2 weeks.  If you have not heard from us then please contact us. The fastest way to get your results is to register for My Chart. ° ° °IF you received an x-ray today, you will receive an invoice from [facility]. Please contact [facility] at [phone] with questions or concerns regarding your invoice.  ° °IF you received labwork today, you will receive an invoice from [facility]. Please contact [facility] at [phone] with questions or concerns regarding your invoice.  ° °Our billing staff will not be able to assist you with questions regarding bills from these companies. ° °You will be contacted with the lab results as soon as they are available. The fastest way to get your results is to activate your My Chart account. Instructions are located on the last page of this paperwork. If you have not heard from us regarding the results in 2 weeks, please contact this office. °  ° ° ° °

## 2020-03-12 LAB — BASIC METABOLIC PANEL WITH GFR
BUN: 17 mg/dL (ref 7–25)
CO2: 25 mmol/L (ref 20–32)
Calcium: 9.2 mg/dL (ref 8.6–10.3)
Chloride: 106 mmol/L (ref 98–110)
Creat: 0.97 mg/dL (ref 0.70–1.11)
GFR, Est African American: 85 mL/min/{1.73_m2} (ref >=60)
GFR, Est Non African American: 73 mL/min/{1.73_m2} (ref >=60)
Glucose, Bld: 103 mg/dL — ABNORMAL HIGH (ref 65–99)
Potassium: 4.7 mmol/L (ref 3.5–5.3)
Sodium: 138 mmol/L (ref 135–146)

## 2020-03-14 DIAGNOSIS — L723 Sebaceous cyst: Secondary | ICD-10-CM | POA: Diagnosis not present

## 2020-03-14 DIAGNOSIS — D044 Carcinoma in situ of skin of scalp and neck: Secondary | ICD-10-CM | POA: Diagnosis not present

## 2020-03-14 DIAGNOSIS — D485 Neoplasm of uncertain behavior of skin: Secondary | ICD-10-CM | POA: Diagnosis not present

## 2020-04-15 ENCOUNTER — Other Ambulatory Visit: Payer: Self-pay | Admitting: Family Medicine

## 2020-04-15 MED ORDER — AMLODIPINE BESYLATE 5 MG PO TABS: 5.0000 mg | ORAL_TABLET | Freq: Every day | ORAL | 0 refills | Status: DC

## 2020-04-15 NOTE — Telephone Encounter (Signed)
Refill Amlodipine

## 2020-04-15 NOTE — Telephone Encounter (Signed)
Prescription sent to pharmacy.

## 2020-04-23 ENCOUNTER — Ambulatory Visit (HOSPITAL_COMMUNITY)
Admission: EM | Admit: 2020-04-23 | Discharge: 2020-04-23 | Disposition: A | Discharge status: Home / Self Care | Payer: Medicare Other | Attending: Family Medicine | Admitting: Family Medicine

## 2020-04-23 ENCOUNTER — Ambulatory Visit (INDEPENDENT_AMBULATORY_CARE_PROVIDER_SITE_OTHER): Payer: Medicare Other

## 2020-04-23 ENCOUNTER — Other Ambulatory Visit: Payer: Self-pay

## 2020-04-23 ENCOUNTER — Encounter (HOSPITAL_COMMUNITY): Payer: Self-pay

## 2020-04-23 DIAGNOSIS — J40 Bronchitis, not specified as acute or chronic: Secondary | ICD-10-CM

## 2020-04-23 DIAGNOSIS — R0602 Shortness of breath: Secondary | ICD-10-CM | POA: Diagnosis not present

## 2020-04-23 DIAGNOSIS — R059 Cough, unspecified: Secondary | ICD-10-CM | POA: Diagnosis not present

## 2020-04-23 DIAGNOSIS — J9811 Atelectasis: Secondary | ICD-10-CM | POA: Diagnosis not present

## 2020-04-23 MED ORDER — ALBUTEROL SULFATE HFA 108 (90 BASE) MCG/ACT IN AERS: 1.0000 | INHALATION_SPRAY | Freq: Four times a day (QID) | RESPIRATORY_TRACT | 2 refills | Status: DC | PRN

## 2020-04-23 MED ORDER — PREDNISONE 10 MG PO TABS
20.0000 mg | ORAL_TABLET | Freq: Every day | ORAL | 0 refills | Status: AC
Start: 2020-04-23 — End: 2020-04-28

## 2020-04-23 MED ORDER — BENZONATATE 100 MG PO CAPS
100.0000 mg | ORAL_CAPSULE | Freq: Three times a day (TID) | ORAL | 0 refills | Status: DC
Start: 2020-04-23 — End: 2020-04-29

## 2020-04-23 MED ORDER — ALBUTEROL SULFATE HFA 108 (90 BASE) MCG/ACT IN AERS: 1.0000 | INHALATION_SPRAY | Freq: Four times a day (QID) | RESPIRATORY_TRACT | 0 refills | Status: DC | PRN

## 2020-04-23 NOTE — Discharge Instructions (Addendum)
Treating you for bronchitis.  Take the medication as prescribed. X-ray without any pneumonia Follow up as needed for continued or worsening symptoms

## 2020-04-23 NOTE — ED Triage Notes (Signed)
Pt in with c/o productive cough, body aches, and congestion that has been going on since last Saturday  Pt has been taking cold medicine and cough drops and albuterol inhaler with some relief  Has been vaccinated

## 2020-04-23 NOTE — ED Provider Notes (Signed)
[place]    CSN: [id number] Arrival date & time: 04/23/20  1044      History   Chief Complaint Chief Complaint  Patient presents with  . Cough  . Generalized Body Aches  . Nasal Congestion    HPI Brendan Hopkins is a 84 y.o. male.   Patient is a 84 year old male presents today with productive cough, body aches, congestion.  Is been going on since Saturday.  He has been using cold medication and cough drops.  He is also been using leftover albuterol inhaler which helps his breathing.  No fevers, chills, body aches, chest pain or shortness of breath.  Has been fully vaccinated     Past Medical History:  Diagnosis Date  . Acute deep vein thrombosis (DVT) of tibial vein of right lower extremity (HCC)    after ankle fracture/surgery (2/21)  . Arthritis    RIGHT HIP, HANDS, BACK  . Blood clot in vein    right lower leg  . Cancer [facility])    Skin cancer   . Cataract   . Cervical radiculopathy    severe C3-4 neuroforaminal stenosis, C5-6 left synovial cyst compressing left nerve root.  . Colon polyps    2005  . COPD (chronic obstructive pulmonary disease) ([place])    SMOKED FOR 61 YRS  . Diverticulosis    2005  . Fatty liver   . GERD (gastroesophageal reflux disease)   . Hard of hearing   . History of kidney stones   . Hypertension   . Leg fracture, right   . Pain    RIGHT HIP AND BACK  . Personal history of colonic polyps-adenoma and sessile serrated polyp 07/15/2010  . Pneumonia   . RBBB    POSS HX AF IN PAST- PAC'S  . Shortness of breath    WITH EXERTION ONLY  . TIA (transient ischemic attack)   . Wears glasses     Patient Active Problem List   Diagnosis Date Noted  . SIRS (systemic inflammatory response syndrome) ([place]) 11/18/2019  . COPD (chronic obstructive pulmonary disease) ([place])   . Status post ORIF of fracture of ankle   . AKI (acute kidney injury) ([employer])   . Hematuria   . Insomnia   . GERD (gastroesophageal reflux disease)   . Acute  deep vein thrombosis (DVT) of tibial vein of right lower extremity ([place])   . Ankle syndesmosis disruption, right, initial encounter 07/16/2019  . Closed right ankle fracture 07/11/2019  . Closed displaced trimalleolar fracture of right lower leg 07/09/2019  . Obese 01/10/2013  . S/P right THA, AA 01/09/2013  . Dyspnea 10/20/2012  . Preop cardiovascular exam 10/20/2012  . Bruit 10/20/2012  . HLD (hyperlipidemia) 12/02/2011  . Impaired glucose tolerance 12/02/2011  . TIA (transient ischemic attack) 12/01/2011  . H/O atrial fibrillation without current medication 12/01/2011  . HTN (hypertension) 12/01/2011  . Arthritis 12/01/2011  . Personal history of colonic polyps-adenoma and sessile serrated polyp 07/15/2010    Past Surgical History:  Procedure Laterality Date  . COLONOSCOPY     maybe 3   . COLONOSCOPY W/ POLYPECTOMY    . CYSTOSCOPY WITH RETROGRADE PYELOGRAM, URETEROSCOPY AND STENT PLACEMENT Left 12/11/2019   Procedure: CYSTOSCOPY WITH LEFT RETROGRADE LEFT  URETEROSCOPY WITH HOLMIUM LASER AND STENT PLACEMENT;  Surgeon: Irine Seal, MD;  [facility];  Service: Urology;  Laterality: Left;  . ORIF ANKLE FRACTURE Right 07/11/2019  . ORIF ANKLE FRACTURE Right 07/11/2019   Procedure: OPEN REDUCTION INTERNAL  FIXATION (ORIF) ANKLE FRACTURE;  Surgeon: Shona Needles, MD;  Location: [place];  Service: Orthopedics;  Laterality: Right;  . skin cancers removed     . TOTAL HIP ARTHROPLASTY Right 01/09/2013   Procedure: RIGHT TOTAL HIP ARTHROPLASTY ANTERIOR APPROACH;  Surgeon: [name], MD;  [facility];  Service: Orthopedics;  Laterality: Right;       Home Medications    Prior to Admission medications   Medication Sig Start Date End Date Taking? Authorizing Provider  acetaminophen (TYLENOL) 650 MG CR tablet Take 650 mg by mouth every 8 (eight) hours as needed for pain.    [provider]  albuterol (VENTOLIN HFA) 108 (90 Base) MCG/ACT inhaler Inhale 1-2 puffs into the  lungs every 6 (six) hours as needed for wheezing or shortness of breath. 04/23/20   Loura Halt A, NP  amLODipine (NORVASC) 5 MG tablet Take 1 tablet (5 mg total) by mouth at bedtime. 04/15/20   [name], MD  benzonatate (TESSALON) 100 MG capsule Take 1 capsule (100 mg total) by mouth every 8 (eight) hours. 04/23/20   Loura Halt A, NP  fish oil-omega-3 fatty acids 1000 MG capsule Take 1 g by mouth daily.    [provider]  furosemide (LASIX) 40 MG tablet Take 1 tablet (40 mg total) by mouth daily. 02/26/20   [name], MD  HYDROcodone-acetaminophen (NORCO/VICODIN) 5-325 MG tablet Take 1 tablet by mouth every 6 (six) hours as needed for moderate pain. 12/11/19 12/10/20  Irine Seal, MD  meclizine (ANTIVERT) 25 MG tablet TAKE ONE TABLET BY MOUTH THREE TIMES DAILY AS NEEDED FOR DIZZINESS 10/11/19   [name], MD  Multiple Vitamin (MULTIVITAMIN WITH MINERALS) TABS Take 1 tablet by mouth daily.    [provider]  pantoprazole (PROTONIX) 40 MG tablet Take 1 tablet (40 mg total) by mouth daily. 11/27/19   [name], MD  Peppermint Oil (IBGARD PO) Take 2 capsules by mouth See admin instructions. Take 2 capsules in the morning, at lunch, late after noon and bed time and in middle of the night for a total of 10 tablets    [provider]  predniSONE (DELTASONE) 10 MG tablet Take 2 tablets (20 mg total) by mouth daily for 5 days. 04/23/20 04/28/20  Loura Halt A, NP  valsartan (DIOVAN) 80 MG tablet Take 1 tablet (80 mg total) by mouth daily. 02/26/20   [name], MD  zolpidem (AMBIEN) 10 MG tablet Take 1 tablet (10 mg total) by mouth at bedtime as needed for sleep. 02/28/20 03/29/20  [name], MD    Family History Family History  Problem Relation Age of Onset  . Cancer Father   . Cancer Mother        died more of old age at age 82  . Colon cancer Neg Hx   . Esophageal cancer Neg Hx   . Prostate cancer Neg Hx   . Rectal cancer Neg Hx   .  Stomach cancer Neg Hx     Social History Social History   Tobacco Use  . Smoking status: Former Smoker    Years: 57.00    Types: Cigarettes    Quit date: 05/25/2007    Years since quitting: 12.9  . Smokeless tobacco: Never Used  Vaping Use  . Vaping Use: Never used  Substance Use Topics  . Alcohol use: No    Alcohol/week: 0.0 standard drinks  . Drug use: No  Allergies   Patient has no known allergies.   Review of Systems Review of Systems   Physical Exam Triage Vital Signs ED Triage Vitals  Enc Vitals Group     BP 04/23/20 1214 138/84     Pulse Rate 04/23/20 1214 88     Resp 04/23/20 1214 20     Temp 04/23/20 1219 (!) 97.5 F (36.4 C)     Temp Source 04/23/20 1219 Oral     SpO2 04/23/20 1214 96 %     Weight --      Height --      Head Circumference --      Peak Flow --      Pain Score 04/23/20 1213 4     Pain Loc --      Pain Edu? --      Excl. in [place]? --    No data found.  Updated Vital Signs BP 138/84 (BP Location: Right Arm)   Pulse 88   Temp (!) 97.5 F (36.4 C) (Oral)   Resp 20   SpO2 96%   Visual Acuity Right Eye Distance:   Left Eye Distance:   Bilateral Distance:    Right Eye Near:   Left Eye Near:    Bilateral Near:     Physical Exam Vitals and nursing note reviewed.  Constitutional:      Appearance: Normal appearance.  HENT:     Head: Normocephalic and atraumatic.     Nose: Nose normal.  Eyes:     Conjunctiva/sclera: Conjunctivae normal.  Cardiovascular:     Rate and Rhythm: Normal rate and regular rhythm.  Pulmonary:     Effort: Pulmonary effort is normal.     Comments: Decreased lung sounds in bases  Musculoskeletal:        General: Normal range of motion.     Cervical back: Normal range of motion.  Skin:    General: Skin is warm and dry.  Neurological:     Mental Status: He is alert.  Psychiatric:        Mood and Affect: Mood normal.      UC Treatments / Results  Labs (all labs ordered are listed, but only  abnormal results are displayed) Labs Reviewed - No data to display  EKG   Radiology DG Chest 2 View  Result Date: 04/23/2020 CLINICAL DATA:  Cough, shortness of breath EXAM: CHEST - 2 VIEW COMPARISON:  05/04/2020 FINDINGS: Upper normal heart size. Mediastinal contours and pulmonary vascularity normal. Atherosclerotic calcification aorta. Bronchitic changes with bibasilar atelectasis and chronic accentuation of interstitial markings. No definite acute infiltrate, pleural effusion or pneumothorax. Bones demineralized. IMPRESSION: Bronchitic changes with bibasilar atelectasis and mild chronic basilar interstitial lung disease. No acute infiltrate. Electronically Signed   By: Lavonia Dana M.D.   On: 04/23/2020 13:01    Procedures Procedures (including critical care time)  Medications Ordered in UC Medications - No data to display  Initial Impression / Assessment and Plan / UC Course  I have reviewed the triage vital signs and the nursing notes.  Pertinent labs & imaging results that were available during my care of the patient were reviewed by me and considered in my medical decision making (see chart for details).     Bronchitis X-ray without any acute infiltrate We will go and treat bronchitis with short burst of prednisone.  Tessalon Perles for cough and albuterol as needed Follow up as needed for continued or worsening symptoms  Final Clinical Impressions(s) / UC Diagnoses  Final diagnoses:  Bronchitis     Discharge Instructions     Treating you for bronchitis.  Take the medication as prescribed. X-ray without any pneumonia Follow up as needed for continued or worsening symptoms     ED Prescriptions    Medication Sig Dispense Auth. Provider   benzonatate (TESSALON) 100 MG capsule Take 1 capsule (100 mg total) by mouth every 8 (eight) hours. 21 capsule Chinenye Katzenberger A, NP   predniSONE (DELTASONE) 10 MG tablet Take 2 tablets (20 mg total) by mouth daily for 5 days. 10  tablet Jona Erkkila A, NP   albuterol (VENTOLIN HFA) 108 (90 Base) MCG/ACT inhaler  (Status: Discontinued) Inhale 1-2 puffs into the lungs every 6 (six) hours as needed for wheezing or shortness of breath. 1 each Jamise Pentland A, NP   albuterol (VENTOLIN HFA) 108 (90 Base) MCG/ACT inhaler Inhale 1-2 puffs into the lungs every 6 (six) hours as needed for wheezing or shortness of breath. 1 each Orvan July, NP     PDMP not reviewed this encounter.   Loura Halt A, NP 04/23/20 1406

## 2020-04-28 ENCOUNTER — Other Ambulatory Visit: Payer: Self-pay | Admitting: Family Medicine

## 2020-04-28 ENCOUNTER — Telehealth: Payer: Self-pay | Admitting: Family Medicine

## 2020-04-28 MED ORDER — HYDROCODONE-HOMATROPINE 5-1.5 MG/5ML PO SYRP: 5.0000 mL | ORAL_SOLUTION | Freq: Three times a day (TID) | ORAL | 0 refills | Status: DC | PRN

## 2020-04-28 NOTE — Telephone Encounter (Signed)
Need refill on his Hydrocodone cough medication

## 2020-04-28 NOTE — Telephone Encounter (Signed)
Patient was seen on 04/23/2020 at [facility] for bronchitis and given Tessalon. Reports that medication is not effective.   MD please advise.

## 2020-04-28 NOTE — Telephone Encounter (Signed)
I will send in hycodan.

## 2020-04-29 ENCOUNTER — Telehealth: Payer: Self-pay

## 2020-04-29 ENCOUNTER — Other Ambulatory Visit: Payer: Self-pay

## 2020-04-29 ENCOUNTER — Encounter: Payer: Self-pay | Admitting: Family Medicine

## 2020-04-29 ENCOUNTER — Ambulatory Visit (INDEPENDENT_AMBULATORY_CARE_PROVIDER_SITE_OTHER): Payer: Medicare Other | Admitting: Family Medicine

## 2020-04-29 ENCOUNTER — Ambulatory Visit
Admission: RE | Admit: 2020-04-29 | Discharge: 2020-04-29 | Disposition: A | Discharge status: Home / Self Care | Payer: Medicare Other | Source: Ambulatory Visit | Attending: Family Medicine | Admitting: Family Medicine

## 2020-04-29 VITALS — BP 150/86 | HR 56 | Temp 98.2°F | Resp 18 | Ht 71.0 in | Wt 250.4 lb

## 2020-04-29 DIAGNOSIS — J9811 Atelectasis: Secondary | ICD-10-CM | POA: Diagnosis not present

## 2020-04-29 DIAGNOSIS — J90 Pleural effusion, not elsewhere classified: Secondary | ICD-10-CM | POA: Diagnosis not present

## 2020-04-29 DIAGNOSIS — R0781 Pleurodynia: Secondary | ICD-10-CM

## 2020-04-29 DIAGNOSIS — J40 Bronchitis, not specified as acute or chronic: Secondary | ICD-10-CM | POA: Diagnosis not present

## 2020-04-29 DIAGNOSIS — J019 Acute sinusitis, unspecified: Secondary | ICD-10-CM | POA: Diagnosis not present

## 2020-04-29 MED ORDER — HYDROCODONE-HOMATROPINE 5-1.5 MG/5ML PO SYRP: 5.0000 mL | ORAL_SOLUTION | Freq: Three times a day (TID) | ORAL | 0 refills | Status: DC | PRN

## 2020-04-29 MED ORDER — ALBUTEROL SULFATE HFA 108 (90 BASE) MCG/ACT IN AERS: 1.0000 | INHALATION_SPRAY | Freq: Four times a day (QID) | RESPIRATORY_TRACT | 2 refills | Status: DC | PRN

## 2020-04-29 MED ORDER — LEVOFLOXACIN 500 MG PO TABS
500.0000 mg | ORAL_TABLET | Freq: Every day | ORAL | 0 refills | Status: DC
Start: 2020-04-29 — End: 2020-09-11

## 2020-04-29 NOTE — Progress Notes (Signed)
   Subjective:    Patient ID: Brendan Hopkins, male    DOB: May 09, 1936, 84 y.o.   MRN: [id number]  Patient presents for Bronchitis (pt has been diagnosed with bronchitis and broken rib, pt not doing much better, pt states intiinflamatory did not help and would like something stronger )   Pt here for ER follow up. He was seen in the ER 12/1 with cough with production, body aches.  Diagnosed with bronchitis, he was prescribed tessalon perrles, prednisone and albuterol PCP called in hycodan cough syrup yesterday he didn't know it was at the pharmacy    He didn't get the tessalon since it didn't help, and he didn't get albuterol because of cost  Over the past week  Mucous now thick yellow dark color, cough has worsned also has sinus pressure /drainage No fever     This morning he was coughing so hard he felt a pop on the right side. They called EMS this AM   he opted not to go back to ER to sit He has been using a pillow to brace his side when she coughs    CXR from ER showed bronchitis, no acute infiltrate, no rib abnormality   Flu shot UTD, COVID-19  UTD and booster    Review Of Systems:  GEN- denies fatigue, fever, weight loss,weakness, recent illness HEENT- denies eye drainage, change in vision, +nasal discharge, CVS- denies chest pain, palpitations RESP- denies SOB, + cough, +wheeze ABD- denies N/V, change in stools, abd pain GU- denies dysuria, hematuria, dribbling, incontinence MSK- denies joint pain, muscle aches, injury Neuro- denies headache, dizziness, syncope, seizure activity       Objective:    BP (!) 150/86   Pulse (!) 56   Temp 98.2 F (36.8 C) (Temporal)   Resp 18   Ht 5\' 11"  (1.803 m)   Wt 250 lb 6.4 oz (113.6 kg)   SpO2 93%   BMI 34.92 kg/m  GEN- NAD, alert and oriented x3 HEENT- PERRL, EOMI, non injected sclera, pink conjunctiva, MMM, oropharynx clear , TM clear bilat no effusion,  + maxillary sinus tenderness, inflammed turbinates,  Nasal drainage   Neck- Supple, no LAD CVS- RRR, no murmur Chest wall TTP Right lower chest wall  RESP-few scattered wheeze, no rales  EXT- chronic LE   edema Pulses- Radial 2+         Assessment & Plan:      Problem List Items Addressed This Visit    None    Visit Diagnoses    Rib pain    -  Primary   obtain xray likely has fracture but no acute sign of lung puncture, moving air, sats okay    Relevant Orders   DG Chest 2 View (Completed)   Bronchitis       wosrening bronchitis, now with likely rib fracture and sinusitis, add levaqin to cover bacterial superinfection, hycodan , albuterol    Relevant Orders   DG Chest 2 View (Completed)   Acute sinusitis, recurrence not specified, unspecified location       Relevant Medications   levofloxacin (LEVAQUIN) 500 MG tablet   HYDROcodone-homatropine (HYCODAN) 5-1.5 MG/5ML syrup      Note: This dictation was prepared with Dragon dictation along with smaller phrase technology. Any transcriptional errors that result from this process are unintentional.

## 2020-04-29 NOTE — Telephone Encounter (Signed)
Brendan Hopkins had a cough around the holiday, UC prescribed an antibiotic, he's still coughing, EMS was called out last night 04-28-20, when they assessed Brendan Hopkins they mentioned could be cracked rib, daughter is asking could he be seen by his Provider.

## 2020-04-29 NOTE — Telephone Encounter (Signed)
Has already been seen by my partner by the time I got this message.

## 2020-04-29 NOTE — Patient Instructions (Addendum)
Cxr to be done   [place] 100  Levaquin 500mg  oncea day for pneumonia Use albuterol inhaler Use cough medicine for pain and cough F/U as needed

## 2020-05-06 ENCOUNTER — Ambulatory Visit (INDEPENDENT_AMBULATORY_CARE_PROVIDER_SITE_OTHER): Payer: Medicare Other | Admitting: Family Medicine

## 2020-05-06 ENCOUNTER — Other Ambulatory Visit: Payer: Self-pay

## 2020-05-06 VITALS — BP 130/70 | HR 75 | Temp 97.2°F | Ht 71.0 in | Wt 252.0 lb

## 2020-05-06 DIAGNOSIS — R0602 Shortness of breath: Secondary | ICD-10-CM | POA: Diagnosis not present

## 2020-05-06 MED ORDER — OXYCODONE-ACETAMINOPHEN 7.5-325 MG PO TABS
1.0000 | ORAL_TABLET | ORAL | 0 refills | Status: DC | PRN
Start: 2020-05-06 — End: 2020-05-12

## 2020-05-06 NOTE — Progress Notes (Signed)
Subjective:    Patient ID: Brendan Hopkins, male    DOB: 30-Dec-1935, 84 y.o.   MRN: [id number]  HPI  Wt Readings from Last 3 Encounters:  05/06/20 252 lb (114.3 kg)  04/29/20 250 lb 6.4 oz (113.6 kg)  03/11/20 244 lb 9.6 oz (110.9 kg)  Patient had COVID in November of last year.  He was then diagnosed with a DVT in the spring 2021.  He states that he was doing great until he recently developed bronchitis.  He saw my partner last week who started him on Levaquin.  She obtained a chest x-ray which showed: Persistent bilateral interstitial prominence. Persistent bibasilar atelectasis. Small left pleural effusion.  I am not certain if the interstitial lung markings are due to pulmonary edema or chronic interstitial lung disease, or scarring due to his Covid infection that he had in November 2020.  He states that he is feeling better after the Levaquin.  He still has tremendous pleurisy in the right posterior chest just inferior to the scapula.  He is extremely tender to palpation in that area.  He believes that he broke a rib.  He remembers feeling a pop with coughing and he can still feel it "move" when he takes a deep inspiration.  However he also seems more short of breath and more easily winded despite an improving cough.  He becomes easily winded just talking to me and has to stop and catch his breath.  He also has swelling and has +1 pitting edema to his knees bilaterally.  He also has right-sided basilar crackles that I have not appreciated before.  His weight has trended upwards over the last 2 months.  He had an echocardiogram in September of last year which revealed a normal ejection fraction of 65%.  Past Medical History:  Diagnosis Date  . Acute deep vein thrombosis (DVT) of tibial vein of right lower extremity (HCC)    after ankle fracture/surgery (2/21)  . Arthritis    RIGHT HIP, HANDS, BACK  . Blood clot in vein    right lower leg  . Cancer [facility])    Skin cancer   . Cataract    . Cervical radiculopathy    severe C3-4 neuroforaminal stenosis, C5-6 left synovial cyst compressing left nerve root.  . Colon polyps    2005  . COPD (chronic obstructive pulmonary disease) ([place])    SMOKED FOR 36 YRS  . Diverticulosis    2005  . Fatty liver   . GERD (gastroesophageal reflux disease)   . Hard of hearing   . History of kidney stones   . Hypertension   . Leg fracture, right   . Pain    RIGHT HIP AND BACK  . Personal history of colonic polyps-adenoma and sessile serrated polyp 07/15/2010  . Pneumonia   . RBBB    POSS HX AF IN PAST- PAC'S  . Shortness of breath    WITH EXERTION ONLY  . TIA (transient ischemic attack)   . Wears glasses    Past Surgical History:  Procedure Laterality Date  . COLONOSCOPY     maybe 3   . COLONOSCOPY W/ POLYPECTOMY    . CYSTOSCOPY WITH RETROGRADE PYELOGRAM, URETEROSCOPY AND STENT PLACEMENT Left 12/11/2019   Procedure: CYSTOSCOPY WITH LEFT RETROGRADE LEFT  URETEROSCOPY WITH HOLMIUM LASER AND STENT PLACEMENT;  Surgeon: Irine Seal, MD;  [facility];  Service: Urology;  Laterality: Left;  . ORIF ANKLE FRACTURE Right 07/11/2019  . ORIF ANKLE FRACTURE Right  07/11/2019   Procedure: OPEN REDUCTION INTERNAL FIXATION (ORIF) ANKLE FRACTURE;  Surgeon: Shona Needles, MD;  Location: [place];  Service: Orthopedics;  Laterality: Right;  . skin cancers removed     . TOTAL HIP ARTHROPLASTY Right 01/09/2013   Procedure: RIGHT TOTAL HIP ARTHROPLASTY ANTERIOR APPROACH;  Surgeon: [name], MD;  [facility];  Service: Orthopedics;  Laterality: Right;   Current Outpatient Medications on File Prior to Visit  Medication Sig Dispense Refill  . acetaminophen (TYLENOL) 650 MG CR tablet Take 650 mg by mouth every 8 (eight) hours as needed for pain.    Marland Kitchen albuterol (VENTOLIN HFA) 108 (90 Base) MCG/ACT inhaler Inhale 1-2 puffs into the lungs every 6 (six) hours as needed for wheezing or shortness of breath. (Patient taking differently: Inhale 1-2 puffs  into the lungs every 6 (six) hours as needed for wheezing or shortness of breath. Insurance prefers ProAir) 1 each 2  . amLODipine (NORVASC) 5 MG tablet Take 1 tablet (5 mg total) by mouth at bedtime. 90 tablet 0  . fish oil-omega-3 fatty acids 1000 MG capsule Take 1 g by mouth daily.    . furosemide (LASIX) 40 MG tablet Take 1 tablet (40 mg total) by mouth daily. 30 tablet 3  . HYDROcodone-acetaminophen (NORCO/VICODIN) 5-325 MG tablet Take 1 tablet by mouth every 6 (six) hours as needed for moderate pain. 15 tablet 0  . HYDROcodone-homatropine (HYCODAN) 5-1.5 MG/5ML syrup Take 5 mLs by mouth every 8 (eight) hours as needed for cough. 120 mL 0  . levofloxacin (LEVAQUIN) 500 MG tablet Take 1 tablet (500 mg total) by mouth daily. 7 tablet 0  . meclizine (ANTIVERT) 25 MG tablet TAKE ONE TABLET BY MOUTH THREE TIMES DAILY AS NEEDED FOR DIZZINESS 30 tablet 2  . Multiple Vitamin (MULTIVITAMIN WITH MINERALS) TABS Take 1 tablet by mouth daily.    . pantoprazole (PROTONIX) 40 MG tablet Take 1 tablet (40 mg total) by mouth daily. 90 tablet 3  . Peppermint Oil (IBGARD PO) Take 2 capsules by mouth See admin instructions. Take 2 capsules in the morning, at lunch, late after noon and bed time and in middle of the night for a total of 10 tablets    . valsartan (DIOVAN) 80 MG tablet Take 1 tablet (80 mg total) by mouth daily. 30 tablet 3  . zolpidem (AMBIEN) 10 MG tablet Take 1 tablet (10 mg total) by mouth at bedtime as needed for sleep. 30 tablet 1   No current facility-administered medications on file prior to visit.   No Known Allergies Social History   Socioeconomic History  . Marital status: Widowed    Spouse name: Not on file  . Number of children: 2  . Years of education: 11  . Highest education level: Not on file  Occupational History  . Occupation: Retired  Tobacco Use  . Smoking status: Former Smoker    Years: 57.00    Types: Cigarettes    Quit date: 05/25/2007    Years since quitting: 12.9   . Smokeless tobacco: Never Used  Vaping Use  . Vaping Use: Never used  Substance and Sexual Activity  . Alcohol use: No    Alcohol/week: 0.0 standard drinks  . Drug use: No  . Sexual activity: Not on file  Other Topics Concern  . 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      Objective:   Physical Exam Vitals reviewed.  Constitutional:      General: He is not in acute distress.    Appearance: Normal appearance. He is normal weight. He is not ill-appearing or toxic-appearing.  Cardiovascular:     Rate and Rhythm: Normal rate and regular rhythm.     Heart sounds: Normal heart sounds.  Pulmonary:     Effort: Pulmonary effort is normal. No respiratory distress.     Breath sounds: No stridor. Rales present. No wheezing or rhonchi.    Chest:     Chest wall: No tenderness.  Musculoskeletal:     Right lower leg: Edema present.     Left lower leg: Edema present.  Skin:    Findings: No erythema or rash.  Neurological:     Mental Status: He is alert.           Assessment & Plan:    Shortness of breath - Plan: CBC with Differential/Platelet, Brain natriuretic peptide, COMPLETE METABOLIC PANEL WITH GFR, D-dimer, quantitative (not at [facility])  Patient seems more short of breath than his baseline and despite the fact that he believes his cough is improving after the Levaquin I am concerned by his abnormal breath sounds, his increasing weight, and his pitting edema.  Therefore I will repeat a BNP and if elevated suggest a referral to cardiology for possible congestive heart failure.  I  will also check a D-dimer and if positive, will obtain a CT scan of the lungs to rule out a recurrent thromboembolic event particular given his pleurisy and chest pain and shortness of breath.

## 2020-05-07 LAB — COMPLETE METABOLIC PANEL WITH GFR
AG Ratio: 1.6 (calc) (ref 1.0–2.5)
ALT: 13 U/L (ref 9–46)
AST: 20 U/L (ref 10–35)
Albumin: 3.9 g/dL (ref 3.6–5.1)
Alkaline phosphatase (APISO): 61 U/L (ref 35–144)
BUN: 18 mg/dL (ref 7–25)
CO2: 29 mmol/L (ref 20–32)
Calcium: 9.3 mg/dL (ref 8.6–10.3)
Chloride: 104 mmol/L (ref 98–110)
Creat: 1.04 mg/dL (ref 0.70–1.11)
GFR, Est African American: 77 mL/min/{1.73_m2} (ref >=60)
GFR, Est Non African American: 67 mL/min/{1.73_m2} (ref >=60)
Globulin: 2.5 g/dL (calc) (ref 1.9–3.7)
Glucose, Bld: 131 mg/dL — ABNORMAL HIGH (ref 65–99)
Potassium: 5 mmol/L (ref 3.5–5.3)
Sodium: 141 mmol/L (ref 135–146)
Total Bilirubin: 0.5 mg/dL (ref 0.2–1.2)
Total Protein: 6.4 g/dL (ref 6.1–8.1)

## 2020-05-07 LAB — CBC WITH DIFFERENTIAL/PLATELET
Absolute Monocytes: 663 cells/uL (ref 200–950)
Basophils Absolute: 59 cells/uL (ref 0–200)
Basophils Relative: 0.6 %
Eosinophils Absolute: 366 cells/uL (ref 15–500)
Eosinophils Relative: 3.7 %
HCT: 38.9 % (ref 38.5–50.0)
Hemoglobin: 12.6 g/dL — ABNORMAL LOW (ref 13.2–17.1)
Lymphs Abs: 1752 cells/uL (ref 850–3900)
MCH: 27.5 pg (ref 27.0–33.0)
MCHC: 32.4 g/dL (ref 32.0–36.0)
MCV: 84.7 fL (ref 80.0–100.0)
MPV: 10.3 fL (ref 7.5–12.5)
Monocytes Relative: 6.7 %
Neutro Abs: 7059 cells/uL (ref 1500–7800)
Neutrophils Relative %: 71.3 %
Platelets: 268 10*3/uL (ref 140–400)
RBC: 4.59 10*6/uL (ref 4.20–5.80)
RDW: 15 % (ref 11.0–15.0)
Total Lymphocyte: 17.7 %
WBC: 9.9 10*3/uL (ref 3.8–10.8)

## 2020-05-07 LAB — D-DIMER, QUANTITATIVE: D-Dimer, Quant: 2.51 mcg/mL FEU — ABNORMAL HIGH (ref <0.50)

## 2020-05-07 LAB — BRAIN NATRIURETIC PEPTIDE: Brain Natriuretic Peptide: 59 pg/mL (ref <100)

## 2020-05-08 ENCOUNTER — Other Ambulatory Visit: Payer: Self-pay | Admitting: Family Medicine

## 2020-05-08 DIAGNOSIS — R071 Chest pain on breathing: Secondary | ICD-10-CM

## 2020-05-08 DIAGNOSIS — R0602 Shortness of breath: Secondary | ICD-10-CM

## 2020-05-08 DIAGNOSIS — R7989 Other specified abnormal findings of blood chemistry: Secondary | ICD-10-CM

## 2020-05-09 ENCOUNTER — Ambulatory Visit
Admission: RE | Admit: 2020-05-09 | Discharge: 2020-05-09 | Disposition: A | Discharge status: Home / Self Care | Payer: Medicare Other | Source: Ambulatory Visit | Attending: Family Medicine | Admitting: Family Medicine

## 2020-05-09 DIAGNOSIS — R7989 Other specified abnormal findings of blood chemistry: Secondary | ICD-10-CM | POA: Diagnosis not present

## 2020-05-09 DIAGNOSIS — R0602 Shortness of breath: Secondary | ICD-10-CM

## 2020-05-09 DIAGNOSIS — R071 Chest pain on breathing: Secondary | ICD-10-CM

## 2020-05-09 MED ORDER — IOPAMIDOL (ISOVUE-370) INJECTION 76%
75.0000 mL | Freq: Once | INTRAVENOUS | Status: AC | PRN
  Administered 2020-05-09: 75 mL via INTRAVENOUS

## 2020-05-12 ENCOUNTER — Other Ambulatory Visit: Payer: Self-pay

## 2020-05-12 ENCOUNTER — Telehealth: Payer: Self-pay | Admitting: Family Medicine

## 2020-05-12 ENCOUNTER — Telehealth: Payer: Self-pay

## 2020-05-12 NOTE — Telephone Encounter (Signed)
Patient calling asking to speak with nurse about his MRI on Friday.  CB# [phone]

## 2020-05-12 NOTE — Telephone Encounter (Signed)
Call placed to patient with MRI results, patient verbalized understanding of results given.

## 2020-05-12 NOTE — Telephone Encounter (Signed)
[name] was wondering if Provider mentioned the swelling in his legs and feet, in lab result note.

## 2020-05-12 NOTE — Telephone Encounter (Signed)
Patient needed to talk to nurse about his results, call placed to patient with results

## 2020-05-13 MED ORDER — OXYCODONE-ACETAMINOPHEN 7.5-325 MG PO TABS: 1.0000 | ORAL_TABLET | ORAL | 0 refills | Status: DC | PRN

## 2020-06-10 ENCOUNTER — Telehealth: Payer: Self-pay | Admitting: Family Medicine

## 2020-06-10 NOTE — Telephone Encounter (Signed)
Patient called requesting a refill on his zolpidem (AMBIEN) 10 MG tablet  [facility] [address]

## 2020-06-11 ENCOUNTER — Other Ambulatory Visit: Payer: Self-pay

## 2020-06-12 MED ORDER — ZOLPIDEM TARTRATE 10 MG PO TABS: 10.0000 mg | ORAL_TABLET | Freq: Every evening | ORAL | 1 refills | Status: DC | PRN

## 2020-06-16 DIAGNOSIS — L814 Other melanin hyperpigmentation: Secondary | ICD-10-CM | POA: Diagnosis not present

## 2020-06-16 DIAGNOSIS — L570 Actinic keratosis: Secondary | ICD-10-CM | POA: Diagnosis not present

## 2020-06-16 DIAGNOSIS — L439 Lichen planus, unspecified: Secondary | ICD-10-CM | POA: Diagnosis not present

## 2020-06-16 DIAGNOSIS — D485 Neoplasm of uncertain behavior of skin: Secondary | ICD-10-CM | POA: Diagnosis not present

## 2020-06-16 DIAGNOSIS — D1801 Hemangioma of skin and subcutaneous tissue: Secondary | ICD-10-CM | POA: Diagnosis not present

## 2020-06-16 DIAGNOSIS — D229 Melanocytic nevi, unspecified: Secondary | ICD-10-CM | POA: Diagnosis not present

## 2020-06-16 DIAGNOSIS — L821 Other seborrheic keratosis: Secondary | ICD-10-CM | POA: Diagnosis not present

## 2020-06-16 DIAGNOSIS — D225 Melanocytic nevi of trunk: Secondary | ICD-10-CM | POA: Diagnosis not present

## 2020-06-16 DIAGNOSIS — L905 Scar conditions and fibrosis of skin: Secondary | ICD-10-CM | POA: Diagnosis not present

## 2020-06-16 DIAGNOSIS — Z85828 Personal history of other malignant neoplasm of skin: Secondary | ICD-10-CM | POA: Diagnosis not present

## 2020-06-16 DIAGNOSIS — L853 Xerosis cutis: Secondary | ICD-10-CM | POA: Diagnosis not present

## 2020-07-02 ENCOUNTER — Other Ambulatory Visit: Payer: Self-pay

## 2020-07-02 ENCOUNTER — Telehealth: Payer: Self-pay | Admitting: Family Medicine

## 2020-07-02 DIAGNOSIS — I10 Essential (primary) hypertension: Secondary | ICD-10-CM

## 2020-07-02 MED ORDER — FUROSEMIDE 40 MG PO TABS
40.0000 mg | ORAL_TABLET | Freq: Every day | ORAL | 3 refills | Status: DC
Start: 2020-07-02 — End: 2020-09-19

## 2020-07-02 MED ORDER — VALSARTAN 80 MG PO TABS: 80.0000 mg | ORAL_TABLET | Freq: Every day | ORAL | 3 refills | Status: DC

## 2020-07-02 NOTE — Telephone Encounter (Signed)
Refill Furosemide   [id number] - [place] ([place]), [place] - [address]

## 2020-07-02 NOTE — Telephone Encounter (Signed)
Prescription sent to pharmacy.

## 2020-07-02 NOTE — Telephone Encounter (Signed)
Medication sent.

## 2020-07-02 NOTE — Telephone Encounter (Signed)
Pt called needing a refill on  valsartan (DIOVAN) 80 MG tablet  He is down to 3 tablets  Please call Cb#: [phone]

## 2020-07-14 ENCOUNTER — Other Ambulatory Visit: Payer: Self-pay | Admitting: Family Medicine

## 2020-07-14 MED ORDER — ALBUTEROL SULFATE HFA 108 (90 BASE) MCG/ACT IN AERS
1.0000 | INHALATION_SPRAY | Freq: Four times a day (QID) | RESPIRATORY_TRACT | 6 refills | Status: DC | PRN
Start: 2020-07-14 — End: 2020-09-03

## 2020-07-14 MED ORDER — ZOLPIDEM TARTRATE 10 MG PO TABS: 10.0000 mg | ORAL_TABLET | Freq: Every evening | ORAL | 1 refills | Status: DC | PRN

## 2020-07-14 NOTE — Telephone Encounter (Signed)
Refill Ambien, Ventolin HFA [phone]

## 2020-07-14 NOTE — Telephone Encounter (Signed)
Ok to refill Ambien??  Last office visit 05/06/2020.  Last refill 06/12/2020.

## 2020-07-22 ENCOUNTER — Telehealth: Payer: Self-pay | Admitting: Family Medicine

## 2020-07-22 MED ORDER — AMLODIPINE BESYLATE 5 MG PO TABS
5.0000 mg | ORAL_TABLET | Freq: Every day | ORAL | 3 refills | Status: DC
Start: 2020-07-22 — End: 2020-09-19

## 2020-07-22 NOTE — Telephone Encounter (Signed)
Prescription sent to pharmacy.

## 2020-07-22 NOTE — Telephone Encounter (Signed)
Pt called needing a refill of  amLODipine (NORVASC) 5 MG tablet   Cb#: [phone]

## 2020-08-28 DIAGNOSIS — H5202 Hypermetropia, left eye: Secondary | ICD-10-CM | POA: Diagnosis not present

## 2020-08-28 DIAGNOSIS — H2513 Age-related nuclear cataract, bilateral: Secondary | ICD-10-CM | POA: Diagnosis not present

## 2020-09-01 ENCOUNTER — Telehealth: Payer: Self-pay | Admitting: Family Medicine

## 2020-09-01 NOTE — Progress Notes (Signed)
  Chronic Care Management   Note  09/01/2020 Name: ZAEEM KANDEL MRN: [id number] DOB: Feb 14, 1936  HAYDIN CALANDRA is a 85 y.o. year old male who is a primary care patient of [name], MD. I reached out to [name] by phone today in response to a referral sent by Mr. KYEL PURK PCP, [name], MD.   Mr. Even was given information about Chronic Care Management services today including:  1. CCM service includes personalized support from designated clinical staff supervised by his physician, including individualized plan of care and coordination with other care providers 2. 24/7 contact phone numbers for assistance for urgent and routine care needs. 3. Service will only be billed when office clinical staff spend 20 minutes or more in a month to coordinate care. 4. Only one practitioner may furnish and bill the service in a calendar month. 5. The patient may stop CCM services at any time (effective at the end of the month) by phone call to the office staff.   Patient agreed to services and verbal consent obtained.   Follow up plan:   Carley Perdue [employer] Scheduler

## 2020-09-03 ENCOUNTER — Other Ambulatory Visit: Payer: Self-pay

## 2020-09-03 ENCOUNTER — Other Ambulatory Visit: Payer: Self-pay | Admitting: Family Medicine

## 2020-09-03 MED ORDER — ALBUTEROL SULFATE HFA 108 (90 BASE) MCG/ACT IN AERS
1.0000 | INHALATION_SPRAY | Freq: Four times a day (QID) | RESPIRATORY_TRACT | 6 refills | Status: DC | PRN
Start: 2020-09-03 — End: 2020-09-11

## 2020-09-10 ENCOUNTER — Other Ambulatory Visit: Payer: Self-pay | Admitting: Family Medicine

## 2020-09-10 NOTE — Telephone Encounter (Signed)
Patient called in states he needs a prescription sent in for his Lorrin Mais and his albuterol into [facility]. He isn't sure if the other pharmacy that Gerald Stabs talked him into going with has sent the albuterol via mail but he doesn't want to switch pharamcy.  CB# [phone]

## 2020-09-11 ENCOUNTER — Other Ambulatory Visit: Payer: Self-pay

## 2020-09-11 ENCOUNTER — Encounter: Payer: Self-pay | Admitting: Family Medicine

## 2020-09-11 ENCOUNTER — Ambulatory Visit (INDEPENDENT_AMBULATORY_CARE_PROVIDER_SITE_OTHER): Payer: Medicare Other | Admitting: Family Medicine

## 2020-09-11 VITALS — BP 128/74 | HR 74 | Temp 98.2°F | Resp 14 | Ht 71.0 in | Wt 249.0 lb

## 2020-09-11 DIAGNOSIS — J439 Emphysema, unspecified: Secondary | ICD-10-CM

## 2020-09-11 MED ORDER — ZOLPIDEM TARTRATE 10 MG PO TABS: 10.0000 mg | ORAL_TABLET | Freq: Every evening | ORAL | 1 refills | Status: DC | PRN

## 2020-09-11 MED ORDER — ALBUTEROL SULFATE HFA 108 (90 BASE) MCG/ACT IN AERS
1.0000 | INHALATION_SPRAY | Freq: Four times a day (QID) | RESPIRATORY_TRACT | 6 refills | Status: DC | PRN
Start: 2020-09-11 — End: 2021-07-06

## 2020-09-11 NOTE — Telephone Encounter (Signed)
Ok to refill??  Last office visit 09/11/2020.  Last refill 07/14/2020, #1 refill.

## 2020-09-11 NOTE — Progress Notes (Signed)
Subjective:    Patient ID: Brendan Hopkins, male    DOB: 11-Oct-1935, 85 y.o.   MRN: [id number]  HPI  Wt Readings from Last 3 Encounters:  09/11/20 249 lb (112.9 kg)  05/06/20 252 lb (114.3 kg)  04/29/20 250 lb 6.4 oz (113.6 kg)  Patient had COVID in November of last year.  He was then diagnosed with a DVT in the spring 2021.  He states that he was doing great until he recently developed bronchitis.  He saw my partner last week who started him on Levaquin.  She obtained a chest x-ray which showed: Persistent bilateral interstitial prominence. Persistent bibasilar atelectasis. Small left pleural effusion.  I am not certain if the interstitial lung markings are due to pulmonary edema or chronic interstitial lung disease, or scarring due to his Covid infection that he had in November 2020.  He states that he is feeling better after the Levaquin.  He still has tremendous pleurisy in the right posterior chest just inferior to the scapula.  He is extremely tender to palpation in that area.  He believes that he broke a rib.  He remembers feeling a pop with coughing and he can still feel it "move" when he takes a deep inspiration.  However he also seems more short of breath and more easily winded despite an improving cough.  He becomes easily winded just talking to me and has to stop and catch his breath.  He also has swelling and has +1 pitting edema to his knees bilaterally.  He also has right-sided basilar crackles that I have not appreciated before.  His weight has trended upwards over the last 2 months.  He had an echocardiogram in September of last year which revealed a normal ejection fraction of 65%.  At that time, my plan was: Patient seems more short of breath than his baseline and despite the fact that he believes his cough is improving after the Levaquin I am concerned by his abnormal breath sounds, his increasing weight, and his pitting edema.  Therefore I will repeat a BNP and if elevated  suggest a referral to cardiology for possible congestive heart failure.  I will also check a D-dimer and if positive, will obtain a CT scan of the lungs to rule out a recurrent thromboembolic event particular given his pleurisy and chest pain and shortness of breath.  09/11/20 Patient is here today again complaining of swelling in his legs.  He has +1 edema in both legs distal to the knee.  However his pulmonary exam is stark.  He has diminished breath sounds all throughout.  There are no crackles or rails appreciated but he does have decreased air movement and faint expiratory wheezing.  He reports using albuterol at night and seeing significant improvement with this.  The patient smoked more than 40 years.  The last 10 years he was smoking 2 packs of cigarettes a day.  He has never had pulmonary function test performed but I suspect that he has moderate to severe COPD based on his pulmonary exam today.  Past Medical History:  Diagnosis Date  . Acute deep vein thrombosis (DVT) of tibial vein of right lower extremity (HCC)    after ankle fracture/surgery (2/21)  . Arthritis    RIGHT HIP, HANDS, BACK  . Blood clot in vein    right lower leg  . Cancer [facility])    Skin cancer   . Cataract   . Cervical radiculopathy    severe C3-4  neuroforaminal stenosis, C5-6 left synovial cyst compressing left nerve root.  . Colon polyps    2005  . COPD (chronic obstructive pulmonary disease) ([place])    SMOKED FOR 41 YRS  . Diverticulosis    2005  . Fatty liver   . GERD (gastroesophageal reflux disease)   . Hard of hearing   . History of kidney stones   . Hypertension   . Leg fracture, right   . Pain    RIGHT HIP AND BACK  . Personal history of colonic polyps-adenoma and sessile serrated polyp 07/15/2010  . Pneumonia   . RBBB    POSS HX AF IN PAST- PAC'S  . Shortness of breath    WITH EXERTION ONLY  . TIA (transient ischemic attack)   . Wears glasses    Past Surgical History:  Procedure Laterality  Date  . COLONOSCOPY     maybe 3   . COLONOSCOPY W/ POLYPECTOMY    . CYSTOSCOPY WITH RETROGRADE PYELOGRAM, URETEROSCOPY AND STENT PLACEMENT Left 12/11/2019   Procedure: CYSTOSCOPY WITH LEFT RETROGRADE LEFT  URETEROSCOPY WITH HOLMIUM LASER AND STENT PLACEMENT;  Surgeon: Irine Seal, MD;  Location: [facility];  Service: Urology;  Laterality: Left;  . ORIF ANKLE FRACTURE Right 07/11/2019  . ORIF ANKLE FRACTURE Right 07/11/2019   Procedure: OPEN REDUCTION INTERNAL FIXATION (ORIF) ANKLE FRACTURE;  Surgeon: Shona Needles, MD;  Location: [facility];  Service: Orthopedics;  Laterality: Right;  . skin cancers removed     . TOTAL HIP ARTHROPLASTY Right 01/09/2013   Procedure: RIGHT TOTAL HIP ARTHROPLASTY ANTERIOR APPROACH;  Surgeon: [name], MD;  Location: [facility];  Service: Orthopedics;  Laterality: Right;   Current Outpatient Medications on File Prior to Visit  Medication Sig Dispense Refill  . acetaminophen (TYLENOL) 650 MG CR tablet Take 650 mg by mouth every 8 (eight) hours as needed for pain.    Marland Kitchen amLODipine (NORVASC) 5 MG tablet Take 1 tablet (5 mg total) by mouth at bedtime. 90 tablet 3  . fish oil-omega-3 fatty acids 1000 MG capsule Take 1 g by mouth daily.    . furosemide (LASIX) 40 MG tablet Take 1 tablet (40 mg total) by mouth daily. 30 tablet 3  . HYDROcodone-acetaminophen (NORCO/VICODIN) 5-325 MG tablet Take 1 tablet by mouth every 6 (six) hours as needed for moderate pain. 15 tablet 0  . meclizine (ANTIVERT) 25 MG tablet TAKE ONE TABLET BY MOUTH THREE TIMES DAILY AS NEEDED FOR DIZZINESS 30 tablet 2  . Multiple Vitamin (MULTIVITAMIN WITH MINERALS) TABS Take 1 tablet by mouth daily.    Marland Kitchen oxyCODONE-acetaminophen (PERCOCET) 7.5-325 MG tablet Take 1 tablet by mouth every 4 (four) hours as needed for severe pain (due to broken rib). 30 tablet 0  . pantoprazole (PROTONIX) 40 MG tablet Take 1 tablet (40 mg total) by mouth daily. 90 tablet 3  . Peppermint Oil (IBGARD PO) Take 2 capsules by mouth See  admin instructions. Take 2 capsules in the morning, at lunch, late after noon and bed time and in middle of the night for a total of 10 tablets    . valsartan (DIOVAN) 80 MG tablet Take 1 tablet (80 mg total) by mouth daily. 30 tablet 3  . zolpidem (AMBIEN) 10 MG tablet Take 1 tablet (10 mg total) by mouth at bedtime as needed for sleep. 30 tablet 1  . albuterol (VENTOLIN HFA) 108 (90 Base) MCG/ACT inhaler Inhale 1-2 puffs into the lungs every 6 (six) hours as needed for wheezing or  shortness of breath. 1 each 6   No current facility-administered medications on file prior to visit.   No Known Allergies Social History   Socioeconomic History  . Marital status: Widowed    Spouse name: Not on file  . Number of children: 2  . Years of education: 67  . Highest education level: Not on file  Occupational History  . Occupation: Retired  Tobacco Use  . Smoking status: Former Smoker    Years: 57.00    Types: Cigarettes    Quit date: 05/25/2007    Years since quitting: 13.3  . Smokeless tobacco: Never Used  Vaping Use  . Vaping Use: Never used  Substance and Sexual Activity  . Alcohol use: No    Alcohol/week: 0.0 standard drinks  . Drug use: No  . Sexual activity: Not on file  Other Topics Concern  . 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      Objective:   Physical Exam Vitals reviewed.  Constitutional:      General: He is not in acute distress.    Appearance: Normal appearance. He is normal weight. He is not ill-appearing or toxic-appearing.  Cardiovascular:     Rate and Rhythm:  Normal rate and regular rhythm.     Heart sounds: Normal heart sounds.  Pulmonary:     Effort: Pulmonary effort is normal. No respiratory distress.     Breath sounds: Decreased air movement present. No stridor. Decreased breath sounds and wheezing present. No rhonchi or rales.  Chest:     Chest wall: No tenderness.  Musculoskeletal:     Right lower leg: Edema present.     Left lower leg: Edema present.  Skin:    Findings: No erythema or rash.  Neurological:     Mental Status: He is alert.           Assessment & Plan:    Pulmonary emphysema, unspecified emphysema type ([place])  Pulmonary exam is stark.  I recommended trying Anoro 1 inhalation a day for the next 3 weeks to see if he notices improvement.  We discussed a referral to pulmonology for pulmonary function test however his breath sounds are clearly abnormal and therefore I believe he has COPD.  He would like to try the medication empirically first and see if he notices improvement.  I believe some of the swelling may be due to to right heart strain from his underlying chronic pulmonary disease.  Reassess in 3 weeks to see if his breathing improves on anoro.  There is no echo given his dyspnea on exertion, I will consult pulmonology for pulmonary function test and consider cardiology referral for a stress test.

## 2020-09-12 ENCOUNTER — Telehealth: Payer: Self-pay | Admitting: Family Medicine

## 2020-09-12 DIAGNOSIS — K219 Gastro-esophageal reflux disease without esophagitis: Secondary | ICD-10-CM

## 2020-09-12 MED ORDER — PANTOPRAZOLE SODIUM 40 MG PO TBEC
40.0000 mg | DELAYED_RELEASE_TABLET | Freq: Every day | ORAL | 3 refills | Status: DC
Start: 2020-09-12 — End: 2020-09-16

## 2020-09-12 NOTE — Telephone Encounter (Signed)
Patient called about Rx for   pantoprazole (PROTONIX) 40 MG tablet [[id number]]   Patient has refill left on last script but pharmacy will not honor it. Pharmacy told patient the Rx needs to be resent.  Pharmacy:   [place]), [place] - [address]  [address] [facility] ([place]) [place] [address]  Phone:  [phone] Fax:  [phone]   Please advise at [phone]

## 2020-09-16 ENCOUNTER — Other Ambulatory Visit: Payer: Self-pay

## 2020-09-16 ENCOUNTER — Other Ambulatory Visit: Payer: Self-pay | Admitting: Family Medicine

## 2020-09-16 DIAGNOSIS — I10 Essential (primary) hypertension: Secondary | ICD-10-CM

## 2020-09-16 MED ORDER — VALSARTAN 80 MG PO TABS: 80.0000 mg | ORAL_TABLET | Freq: Every day | ORAL | 3 refills | Status: DC

## 2020-09-16 MED ORDER — MECLIZINE HCL 25 MG PO TABS
ORAL_TABLET | ORAL | 2 refills | Status: DC
Start: 2020-09-16 — End: 2020-09-16

## 2020-09-16 MED ORDER — PANTOPRAZOLE SODIUM 40 MG PO TBEC: 40.0000 mg | DELAYED_RELEASE_TABLET | Freq: Every day | ORAL | 3 refills | Status: DC

## 2020-09-16 MED ORDER — MECLIZINE HCL 25 MG PO TABS
ORAL_TABLET | ORAL | 2 refills | Status: DC
Start: 2020-09-16 — End: 2020-09-17

## 2020-09-16 NOTE — Addendum Note (Signed)
Addended by: Jenna Luo T on: 09/16/2020 01:31 PM   Modules accepted: Orders

## 2020-09-16 NOTE — Telephone Encounter (Signed)
Sent medication to pharmacy.   

## 2020-09-16 NOTE — Telephone Encounter (Signed)
Patient states [facility] hasn't received the refill request for pantoprazole (PROTONIX) 40 MG tablet

## 2020-09-17 ENCOUNTER — Other Ambulatory Visit: Payer: Self-pay | Admitting: *Deleted

## 2020-09-17 DIAGNOSIS — I10 Essential (primary) hypertension: Secondary | ICD-10-CM

## 2020-09-17 MED ORDER — VALSARTAN 80 MG PO TABS: 80.0000 mg | ORAL_TABLET | Freq: Every day | ORAL | 3 refills | Status: DC

## 2020-09-17 MED ORDER — MECLIZINE HCL 25 MG PO TABS
ORAL_TABLET | ORAL | 2 refills | Status: DC
Start: 2020-09-17 — End: 2021-07-20

## 2020-09-17 NOTE — Telephone Encounter (Signed)
Received call from patient.   Reports that medications will need to go through [facility] order.   Requested refill on Ambien.   Ok to refill to mail order??  Last office visit 09/13/2019.  Last refill 09/11/2020 to retail.

## 2020-09-18 MED ORDER — ZOLPIDEM TARTRATE 10 MG PO TABS: 10.0000 mg | ORAL_TABLET | Freq: Every evening | ORAL | 1 refills | Status: DC | PRN

## 2020-09-19 ENCOUNTER — Telehealth: Payer: Self-pay | Admitting: Family Medicine

## 2020-09-19 DIAGNOSIS — I10 Essential (primary) hypertension: Secondary | ICD-10-CM

## 2020-09-19 DIAGNOSIS — K219 Gastro-esophageal reflux disease without esophagitis: Secondary | ICD-10-CM

## 2020-09-19 MED ORDER — FUROSEMIDE 40 MG PO TABS
40.0000 mg | ORAL_TABLET | Freq: Every day | ORAL | 3 refills | Status: DC
Start: 2020-09-19 — End: 2020-10-14

## 2020-09-19 MED ORDER — PANTOPRAZOLE SODIUM 40 MG PO TBEC: 40.0000 mg | DELAYED_RELEASE_TABLET | Freq: Every day | ORAL | 3 refills | Status: DC

## 2020-09-19 MED ORDER — AMLODIPINE BESYLATE 5 MG PO TABS
5.0000 mg | ORAL_TABLET | Freq: Every day | ORAL | 3 refills | Status: DC
Start: 2020-09-19 — End: 2021-05-11

## 2020-09-19 MED ORDER — VALSARTAN 80 MG PO TABS: 80.0000 mg | ORAL_TABLET | Freq: Every day | ORAL | 3 refills | Status: DC

## 2020-09-19 NOTE — Telephone Encounter (Signed)
Routine medications sent to [facility].

## 2020-09-19 NOTE — Telephone Encounter (Signed)
Patient called to clear up confusion about the pharmacy he uses. Patient stated he doesn't want to use mail service any more since he doesn't get his medication on time. Patient wants to use the following pharmacy for all refills:   [place] ([place]), [place] - [address]  [address] [facility] [place] [address]  Phone:  [phone] Fax:  [phone]   Patient asked for all of prescriptions to be transferred back to [facility]; patient is out of some medicationsand low on others. Patient needs to get back on track with medications.  Patient requesting call back from nurse asap. Please advise at [phone].

## 2020-09-22 ENCOUNTER — Telehealth: Payer: Self-pay | Admitting: Pharmacist

## 2020-09-22 ENCOUNTER — Other Ambulatory Visit: Payer: Self-pay

## 2020-09-22 ENCOUNTER — Ambulatory Visit (INDEPENDENT_AMBULATORY_CARE_PROVIDER_SITE_OTHER): Payer: Medicare Other | Admitting: Pharmacist

## 2020-09-22 DIAGNOSIS — J449 Chronic obstructive pulmonary disease, unspecified: Secondary | ICD-10-CM

## 2020-09-22 DIAGNOSIS — I10 Essential (primary) hypertension: Secondary | ICD-10-CM | POA: Diagnosis not present

## 2020-09-22 DIAGNOSIS — K219 Gastro-esophageal reflux disease without esophagitis: Secondary | ICD-10-CM

## 2020-09-22 DIAGNOSIS — E7800 Pure hypercholesterolemia, unspecified: Secondary | ICD-10-CM

## 2020-09-22 DIAGNOSIS — G4700 Insomnia, unspecified: Secondary | ICD-10-CM

## 2020-09-22 NOTE — Progress Notes (Signed)
Chronic Care Management Pharmacy Note  09/22/2020 Name:  Brendan Hopkins MRN:  [id number] DOB:  06-Apr-1936  Subjective: Brendan Hopkins is an 85 y.o. year old male who is a primary patient of Pickard, Cammie Mcgee, MD.  The CCM team was consulted for assistance with disease management and care coordination needs.    Engaged with patient face to face for initial visit in response to provider referral for pharmacy case management and/or care coordination services.   Consent to Services:  The patient was given the following information about Chronic Care Management services today, agreed to services, and gave verbal consent: 1. CCM service includes personalized support from designated clinical staff supervised by the primary care provider, including individualized plan of care and coordination with other care providers 2. 24/7 contact phone numbers for assistance for urgent and routine care needs. 3. Service will only be billed when office clinical staff spend 20 minutes or more in a month to coordinate care. 4. Only one practitioner may furnish and bill the service in a calendar month. 5.The patient may stop CCM services at any time (effective at the end of the month) by phone call to the office staff. 6. The patient will be responsible for cost sharing (co-pay) of up to 20% of the service fee (after annual deductible is met). Patient agreed to services and consent obtained.  Patient Care Team: [name], MD as PCP - General (Family Medicine) Edythe Clarity, [facility] as Pharmacist (Pharmacist)  Recent office visits: 09/11/20 Dr. Dennard Schaumann For Pulmonary emphysema, unspecified emphysema type. Per note: The doctor wants to start Anoro 1 inhalation a day for the next 3 weeks to see if he notices improvement. STOPPED Levofloxacin and Hydrocodone-Homatropine 5-1.5 mg/53m. 05/06/20 DR. Pickard For Shortness of breath. Labs drawn. Per note: The doctor is concerned about the patients breathing and  wants to check a D-dimer and if positive, will obtain a CT scan of the lungs to rule out a recurrent thromboembolic event particular given his pleurisy and chest pain and shortness of breath. STARTED Oxycodone-Acetaminophen 7.5-325  mg 1 tablet every 4 hours PRN.  04/29/20 Dr. DBuelah ManisFor Rib pain. Xray preformed. STARTED Levofloxacin 500 mg daily and STOPPED Benzonatate 100 mg.    Recent consult visits: None recent  Hospital visits: Medication Reconciliation was completed by comparing discharge summary, patient's EMR and Pharmacy Recent  list, and upon discussion with patient.  [facility] [place] [facility].    Objective:  Lab Results  Component Value Date   CREATININE 1.04 05/06/2020   BUN 18 05/06/2020   GFRNONAA 67 05/06/2020   GFRAA 77 05/06/2020   NA 141 05/06/2020   K 5.0 05/06/2020   CALCIUM 9.3 05/06/2020   CO2 29 05/06/2020   GLUCOSE 131 (H) 05/06/2020    Lab Results  Component Value Date/Time   HGBA1C 6.0 (H) 01/26/2019 08:49 AM   HGBA1C 6.1 (H) 12/20/2017 10:08 AM    Last diabetic Eye exam: No results found for: HMDIABEYEEXA  Last diabetic Foot exam: No results found for: HMDIABFOOTEX   Lab Results  Component Value Date   CHOL 175 01/26/2019   HDL 46 01/26/2019   LDLCALC 107 (H) 01/26/2019   TRIG 121 01/26/2019   CHOLHDL 3.8 01/26/2019    Hepatic Function Latest Ref Rng & Units 05/06/2020 11/19/2019 11/18/2019  Total Protein 6.1 - 8.1 g/dL 6.4 6.3(L) 7.0  Albumin 3.5 - 5.0 g/dL - 3.2(L) 3.6  AST 10 -  35 U/L 20 18 21   ALT 9 - 46 U/L 13 14 17   Alk Phosphatase 38 - 126 U/L - 50 60  Total Bilirubin 0.2 - 1.2 mg/dL 0.5 0.6 1.1  Bilirubin, Direct 0.0 - 0.3 mg/dL - - -    Lab Results  Component Value Date/Time   TSH 3.630 12/10/2013 08:57 AM   TSH 4.306 09/22/2012 09:55 AM   FREET4 0.78 (L) 10/18/2008 11:45 AM    CBC Latest Ref Rng & Units 05/06/2020 12/07/2019 11/22/2019  WBC 3.8 - 10.8 Thousand/uL 9.9 6.2  6.7  Hemoglobin 13.2 - 17.1 g/dL 12.6(L) 13.3 11.9(L)  Hematocrit 38.5 - 50.0 % 38.9 41.3 35.9(L)  Platelets 140 - 400 Thousand/uL 268 354 210    Lab Results  Component Value Date/Time   VD25OH 35.64 07/11/2019 11:31 AM    Clinical ASCVD: No  The ASCVD Risk score Mikey Bussing DC Jr., et al., 2013) failed to calculate for the following reasons:   The 2013 ASCVD risk score is only valid for ages 55 to 29    Depression screen PHQ 2/9 03/11/2020 02/26/2020 01/25/2019  Decreased Interest 0 0 0  Down, Depressed, Hopeless 0 0 0  PHQ - 2 Score 0 0 0  Altered sleeping - - -  Tired, decreased energy - - -  Change in appetite - - -  Feeling bad or failure about yourself  - - -  Trouble concentrating - - -  Moving slowly or fidgety/restless - - -  Suicidal thoughts - - -  PHQ-9 Score - - -  Difficult doing work/chores - - -      Social History   Tobacco Use  Smoking Status Former Smoker  . Years: 57.00  . Types: Cigarettes  . Quit date: 05/25/2007  . Years since quitting: 13.3  Smokeless Tobacco Never Used   BP Readings from Last 3 Encounters:  09/11/20 128/74  05/06/20 130/70  04/29/20 (!) 150/86   Pulse Readings from Last 3 Encounters:  09/11/20 74  05/06/20 75  04/29/20 (!) 56   Wt Readings from Last 3 Encounters:  09/11/20 249 lb (112.9 kg)  05/06/20 252 lb (114.3 kg)  04/29/20 250 lb 6.4 oz (113.6 kg)   BMI Readings from Last 3 Encounters:  09/11/20 34.73 kg/m  05/06/20 35.15 kg/m  04/29/20 34.92 kg/m    Assessment/Interventions: Review of patient past medical history, allergies, medications, health status, including review of consultants reports, laboratory and other test data, was performed as part of comprehensive evaluation and provision of chronic care management services.   SDOH:  (Social Determinants of Health) assessments and interventions performed: Yes   Financial Resource Strain: Not on file    SDOH Screenings   Alcohol Screen: Low Risk   . Last  Alcohol Screening Score (AUDIT): 0  Depression (PHQ2-9): Low Risk   . PHQ-2 Score: 0  Financial Resource Strain: Not on file  Food Insecurity: Not on file  Housing: Not on file  Physical Activity: Not on file  Social Connections: Not on file  Stress: Not on file  Tobacco Use: Medium Risk  . Smoking Tobacco Use: Former Smoker  . Smokeless Tobacco Use: Never Used  Transportation Needs: Not on file    [place]  No Known Allergies  Medications Reviewed Today    Reviewed by Six, Eden Lathe, LPN (Licensed Practical Nurse) on 09/11/20 at 1227  Med List Status: <None>  Medication Order Taking? Sig Documenting Provider Last Dose Status Informant  acetaminophen (TYLENOL) 650 MG  CR tablet [id number]  Take 650 mg by mouth every 8 (eight) hours as needed for pain. [provider]  Active Self  albuterol (VENTOLIN HFA) 108 (90 Base) MCG/ACT inhaler [id number]  Inhale 1-2 puffs into the lungs every 6 (six) hours as needed for wheezing or shortness of breath. [name], MD  Active   amLODipine (NORVASC) 5 MG tablet [id number]  Take 1 tablet (5 mg total) by mouth at bedtime. [name], MD  Active   fish oil-omega-3 fatty acids 1000 MG capsule [id number]  Take 1 g by mouth daily. [provider]  Active Self  furosemide (LASIX) 40 MG tablet [id number]  Take 1 tablet (40 mg total) by mouth daily. [name], MD  Active   HYDROcodone-acetaminophen (NORCO/VICODIN) 5-325 MG tablet [id number]  Take 1 tablet by mouth every 6 (six) hours as needed for moderate pain. Irine Seal, MD  Active   HYDROcodone-homatropine [facility]) 5-1.5 MG/5ML syrup [id number]  Take 5 mLs by mouth every 8 (eight) hours as needed for cough. [place], Modena Nunnery, MD  Active   levofloxacin [facility]) 500 MG tablet [id number]  Take 1 tablet (500 mg total) by mouth daily. Alycia Rossetti, MD  Active   meclizine (ANTIVERT) 25 MG tablet [id number]  TAKE ONE TABLET BY MOUTH THREE TIMES DAILY AS NEEDED FOR  DIZZINESS [name], MD  Active Self  Multiple Vitamin (MULTIVITAMIN WITH MINERALS) TABS [id number]  Take 1 tablet by mouth daily. [provider]  Active Self  oxyCODONE-acetaminophen (PERCOCET) 7.5-325 MG tablet [id number]  Take 1 tablet by mouth every 4 (four) hours as needed for severe pain (due to broken rib). [name], MD  Active   pantoprazole (PROTONIX) 40 MG tablet [id number]  Take 1 tablet (40 mg total) by mouth daily. [name], MD  Active   Peppermint Oil (IBGARD PO) [id number]  Take 2 capsules by mouth See admin instructions. Take 2 capsules in the morning, at lunch, late after noon and bed time and in middle of the night for a total of 10 tablets [provider]  Active Self           Med Note Wilmon Pali, MELISSA R   Fri Nov 30, 2019  4:00 PM)    valsartan (DIOVAN) 80 MG tablet [id number]  Take 1 tablet (80 mg total) by mouth daily. [name], MD  Active   zolpidem (AMBIEN) 10 MG tablet [id number]  Take 1 tablet (10 mg total) by mouth at bedtime as needed for sleep. [name], MD  Active   Med List Note Julieta Gutting 07/14/20 6067): e          Patient Active Problem List   Diagnosis Date Noted  . SIRS (systemic inflammatory response syndrome) ([place]) 11/18/2019  . COPD (chronic obstructive pulmonary disease) ([place])   . Status post ORIF of fracture of ankle   . AKI (acute kidney injury) ([place])   . Hematuria   . Insomnia   . GERD (gastroesophageal reflux disease)   . Acute deep vein thrombosis (DVT) of tibial vein of right lower extremity ([place])   . Ankle syndesmosis disruption, right, initial encounter 07/16/2019  . Closed right ankle fracture 07/11/2019  . Closed displaced trimalleolar fracture of right lower leg 07/09/2019  . Obese 01/10/2013  . S/P right THA, AA 01/09/2013  . Dyspnea 10/20/2012  . Preop cardiovascular exam 10/20/2012  . Bruit 10/20/2012  . HLD (hyperlipidemia) 12/02/2011  . Impaired glucose tolerance  12/02/2011  . TIA (transient ischemic attack) 12/01/2011  . H/O atrial fibrillation without current medication 12/01/2011  . HTN (hypertension) 12/01/2011  . Arthritis 12/01/2011  . Personal history of colonic polyps-adenoma and sessile serrated polyp 07/15/2010    Immunization History  Administered Date(s) Administered  . Fluad Quad(high Dose 65+) 01/25/2019, 02/21/2020  . Influenza Split 02/15/2011  . Influenza, High Dose Seasonal PF 02/17/2018  . Influenza,inj,Quad PF,6+ Mos 04/09/2013, 02/12/2014, 03/14/2015, 03/04/2016, 03/02/2017  . Pneumococcal Conjugate-13 04/09/2013  . Pneumococcal Polysaccharide-23 12/19/2014  . Td 09/10/2003    Conditions to be addressed/monitored:  HTN, COPD, GERD, HLD, Insomina  There are no care plans that you recently modified to display for this patient.    Medication Assistance: Application for Anoro  medication assistance program. in process.  Anticipated assistance start date unknown at this time.  See plan of care for additional detail.  Patient's preferred pharmacy is:  [facility] ([place]), [place] - [address] [place] [address] Phone: [phone] Fax: [phone]  Uses pill box? Yes Pt endorses 100% compliance  We discussed: Benefits of medication synchronization, packaging and delivery as well as enhanced pharmacist oversight with [employer]. Patient decided to: Continue current medication management strategy  Care Plan and Follow Up Patient Decision:  Patient agrees to Care Plan and Follow-up.  Plan: The care management team will reach out to the patient again over the next 120 days.  Beverly Milch, PharmD Clinical Pharmacist [facility] [phone]  Current Barriers:  . Unable to achieve control of shortness of breath   Pharmacist Clinical Goal(s):  Marland Kitchen Patient will achieve control of breathing  as evidenced by symptoms . adhere to  prescribed medication regimen as evidenced by fill dates . contact provider office for questions/concerns as evidenced notation of same in electronic health record through collaboration with PharmD and provider.   Interventions: . 1:1 collaboration with [name], MD regarding development and update of comprehensive plan of care as evidenced by provider attestation and co-signature . Inter-disciplinary care team collaboration (see longitudinal plan of care) . Comprehensive medication review performed; medication list updated in electronic medical record  Hypertension (BP goal <140/90) -Controlled -Current treatment: . Amlodipine 34m daily . Valsartan 891mdaily -Medications previously tried: HCTZ 2566maily -Current home readings: "normal" per patient no logs available today, he does monitor at home at least a few times per week  -Current exercise habits: minimal since he has had COVID due to SOB, slowly getting better -Denies hypotensive/hypertensive symptoms -Educated on BP goals and benefits of medications for prevention of heart attack, stroke and kidney damage; Exercise goal of 150 minutes per week; Importance of home blood pressure monitoring; Symptoms of hypotension and importance of maintaining adequate hydration; -Counseled to monitor BP at home a few times per week, document, and provide log at future appointments -Recommended to continue current medication  Hyperlipidemia: (LDL goal < 100) -Not ideally controlled -Current treatment: . Fish oil 1000m18mily -Medications previously tried: none noted  -Current exercise habits: see above -Educated on Cholesterol goals;  Importance of limiting foods high in cholesterol; Exercise goal of 150 minutes per week;  -Patient would benefit from updated lipid panel -Recommended updated lipid panel for updated LDL results and proper med management.  COPD? (Goal: control symptoms and prevent exacerbations) -Not ideally  controlled -Current treatment  . Ventolin HFA 90 mcg prn . Anoro Ellipta daily - sample -Medications previously tried: none noted  -Exacerbations  requiring treatment in last 6 months: none -Patient reports consistent use of maintenance inhaler -Frequency of rescue inhaler use: at bedtime -Counseled on Proper inhaler technique; Benefits of consistent maintenance inhaler use When to use rescue inhaler Differences between maintenance and rescue inhalers  -Patient reports that his samples of Anoro are really helping his breathing and ability to walk without getting short of breath.  He was given another sample in office today.  He is to call me before he begins his last sample so that we can get a prescription sent in and start the patient assistance process for this. -Recommended to continue current medication Assessed patient finances. Based on income patient believed to be qualified for patient assistance programs for copay help.  GERD (Goal: Minimize symptoms) -Controlled -Current treatment  . Pantoprazole 47m daily -Medications previously tried: none noted -Discussed appropriate timing of medication, 30-60 minutes before most symptomatic meal on an empty stomach. -Recommended to continue current medication  Insomnia (Goal: Adequate sleep) -Controlled -Current treatment  . Zolpidem 110mdaily -Medications previously tried: none noted -Reports sleep has gotten easier as his breathing has gotten better. -Recommended to continue current medication   Patient Goals/Self-Care Activities . Patient will:  - take medications as prescribed focus on medication adherence by fill dates check blood pressure a few times per week, document, and provide at future appointments collaborate with provider on medication access solutions  Follow Up Plan: The care management team will reach out to the patient again over the next 120 days.

## 2020-09-22 NOTE — Progress Notes (Addendum)
Chronic Care Management Pharmacy Assistant   Name: Brendan Hopkins  MRN: [id number] DOB: 02-08-1936  Brendan Hopkins is an 85 y.o. year old male who presents for his initial CCM visit with the clinical pharmacist.  Reason for Encounter: Chart Prep   Conditions to be addressed/monitored: HTN, COPD, GERD, HLD, Insomina  Primary concerns for visit include: COPD  Recent office visits:  09/11/20 Dr. Dennard Schaumann For Pulmonary emphysema, unspecified emphysema type. Per note: The doctor wants to start Anoro 1 inhalation a day for the next 3 weeks to see if he notices improvement. STOPPED Levofloxacin and Hydrocodone-Homatropine 5-1.5 mg/27ml. 05/06/20 DR. Pickard For Shortness of breath. Labs drawn. Per note: The doctor is concerned about the patients breathing and wants to check a D-dimer and if positive, will obtain a CT scan of the lungs to rule out a recurrent thromboembolic event particular given his pleurisy and chest pain and shortness of breath. STARTED Oxycodone-Acetaminophen 7.5-325  mg 1 tablet every 4 hours PRN.  04/29/20 Dr. Buelah Manis For Rib pain. Xray preformed. STARTED Levofloxacin 500 mg daily and STOPPED Benzonatate 100 mg.   Recent consult visits:  None  Hospital visits:  Medication Reconciliation was completed by comparing discharge summary, patient's EMR and Pharmacy list, and upon discussion with patient.  04/23/20 [place] urgent care at [facility] for [facility] hour. Xray preformed.   New?Medications Started at [facility] Discharge:?? Albuterol Sulfate 108 MCG/ACT 1-2 puffs inhalation every 6 hours PRN. Benzonatate 100 mg every 8 hours Prednisone 20 mg daily  Medication Changes at Hospital Discharge: None  Medications Discontinued at Hospital Discharge: None  Medications that remain the same after Hospital Discharge:??  -All other medications will remain the same.     Medication History: Valsartan 80 mg 90 DS 07/27/20  Medications: Outpatient Encounter Medications  as of 09/22/2020  Medication Sig   acetaminophen (TYLENOL) 650 MG CR tablet Take 650 mg by mouth every 8 (eight) hours as needed for pain.   albuterol (VENTOLIN HFA) 108 (90 Base) MCG/ACT inhaler Inhale 1-2 puffs into the lungs every 6 (six) hours as needed for wheezing or shortness of breath.   amLODipine (NORVASC) 5 MG tablet Take 1 tablet (5 mg total) by mouth at bedtime.   fish oil-omega-3 fatty acids 1000 MG capsule Take 1 g by mouth daily.   furosemide (LASIX) 40 MG tablet Take 1 tablet (40 mg total) by mouth daily.   HYDROcodone-acetaminophen (NORCO/VICODIN) 5-325 MG tablet Take 1 tablet by mouth every 6 (six) hours as needed for moderate pain.   meclizine (ANTIVERT) 25 MG tablet TAKE ONE TABLET BY MOUTH THREE TIMES DAILY AS NEEDED FOR DIZZINESS   Multiple Vitamin (MULTIVITAMIN WITH MINERALS) TABS Take 1 tablet by mouth daily.   oxyCODONE-acetaminophen (PERCOCET) 7.5-325 MG tablet Take 1 tablet by mouth every 4 (four) hours as needed for severe pain (due to broken rib).   pantoprazole (PROTONIX) 40 MG tablet Take 1 tablet (40 mg total) by mouth daily.   Peppermint Oil (IBGARD PO) Take 2 capsules by mouth See admin instructions. Take 2 capsules in the morning, at lunch, late after noon and bed time and in middle of the night for a total of 10 tablets   valsartan (DIOVAN) 80 MG tablet Take 1 tablet (80 mg total) by mouth daily.   zolpidem (AMBIEN) 10 MG tablet Take 1 tablet (10 mg total) by mouth at bedtime as needed for sleep.   No facility-administered encounter medications on file as of 09/22/2020.   Have you  seen any other providers since your last visit? Patient stated no.  Any changes in your medications or health? Patient stated no  Any side effects from any medications? Patient stated no.   Do you have an symptoms or problems not managed by your medications? Patient he has some swelling in his legs.  Any concerns about your health right now? Patient stated his breathing.  Has  your provider asked that you check blood pressure, blood sugar, or follow special diet at home?  Patient stated he checks his blood pressure sometimes.    Do you get any type of exercise on a regular basis? 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   Can you think of a goal you would like to reach for your health? Patient stated improve his health overall.  Do you have any problems getting your medications? Patient stated no.  Is there anything that you would like to discuss during the appointment? Patient stated no.  Please bring medications and supplements to appointment,patient reminded on 09/22/20 face to face at 2 pm.   Follow-Up:Pharmacist Review  Charlann Lange, [place] Pharmacist Assistant [phone]

## 2020-09-23 NOTE — Patient Instructions (Addendum)
Visit Information  Goals Addressed   None    There are no care plans to display for this patient.   Brendan Hopkins was given information about Chronic Care Management services today including:  1. CCM service includes personalized support from designated clinical staff supervised by his physician, including individualized plan of care and coordination with other care providers 2. 24/7 contact phone numbers for assistance for urgent and routine care needs. 3. Standard insurance, coinsurance, copays and deductibles apply for chronic care management only during months in which we provide at least 20 minutes of these services. Most insurances cover these services at 100%, however patients may be responsible for any copay, coinsurance and/or deductible if applicable. This service may help you avoid the need for more expensive face-to-face services. 4. Only one practitioner may furnish and bill the service in a calendar month. 5. The patient may stop CCM services at any time (effective at the end of the month) by phone call to the office staff.  Patient agreed to services and verbal consent obtained.   The patient verbalized understanding of instructions, educational materials, and care plan provided today and agreed to receive a mailed copy of patient instructions, educational materials, and care plan.  Telephone follow up appointment with pharmacy team member scheduled for: 4 months  Edythe Clarity, [facility]  COPD and Physical Activity Chronic obstructive pulmonary disease (COPD) is a long-term (chronic) condition that affects the lungs. COPD is a general term that can be used to describe many different lung problems that cause lung swelling (inflammation) and limit airflow, including chronic bronchitis and emphysema. The main symptom of COPD is shortness of breath, which makes it harder to do even simple tasks. This can also make it harder to exercise and be active. Talk with your health care  provider about treatments to help you breathe better and actions you can take to prevent breathing problems during physical activity. What are the benefits of exercising with COPD? Exercising regularly is an important part of a healthy lifestyle. You can still exercise and do physical activities even though you have COPD. Exercise and physical activity improve your shortness of breath by increasing blood flow (circulation). This causes your heart to pump more oxygen through your body. Moderate exercise can improve your:  Oxygen use.  Energy level.  Shortness of breath.  Strength in your breathing muscles.  Heart health.  Sleep.  Self-esteem and feelings of self-worth.  Depression, stress, and anxiety levels. Exercise can benefit everyone with COPD. The severity of your disease may affect how hard you can exercise, especially at first, but everyone can benefit. Talk with your health care provider about how much exercise is safe for you, and which activities and exercises are safe for you.   What actions can I take to prevent breathing problems during physical activity?  Sign up for a pulmonary rehabilitation program. This type of program may include: ? Education about lung diseases. ? Exercise classes that teach you how to exercise and be more active while improving your breathing. This usually involves:  Exercise using your lower extremities, such as a stationary bicycle.  About 30 minutes of exercise, 2 to 5 times per week, for 6 to 12 weeks  Strength training, such as push ups or leg lifts. ? Nutrition education. ? Group classes in which you can talk with others who also have COPD and learn ways to manage stress.  If you use an oxygen tank, you should use it while you exercise.  Work with your health care provider to adjust your oxygen for your physical activity. Your resting flow rate is different from your flow rate during physical activity.  While you are exercising: ? Take  slow breaths. ? Pace yourself and do not try to go too fast. ? Purse your lips while breathing out. Pursing your lips is similar to a kissing or whistling position. ? If doing exercise that uses a quick burst of effort, such as weight lifting:  Breathe in before starting the exercise.  Breathe out during the hardest part of the exercise (such as raising the weights). Where to find support You can find support for exercising with COPD from:  Your health care provider.  A pulmonary rehabilitation program.  Your local health department or community health programs.  Support groups, online or in-person. Your health care provider may be able to recommend support groups. Where to find more information You can find more information about exercising with COPD from:  American Lung Association: ClassInsider.se.  COPD Foundation: https://www.rivera.net/. Contact a health care provider if:  Your symptoms get worse.  You have chest pain.  You have nausea.  You have a fever.  You have trouble talking or catching your breath.  You want to start a new exercise program or a new activity. Summary  COPD is a general term that can be used to describe many different lung problems that cause lung swelling (inflammation) and limit airflow. This includes chronic bronchitis and emphysema.  Exercise and physical activity improve your shortness of breath by increasing blood flow (circulation). This causes your heart to provide more oxygen to your body.  Contact your health care provider before starting any exercise program or new activity. Ask your health care provider what exercises and activities are safe for you. This information is not intended to replace advice given to you by your health care provider. Make sure you discuss any questions you have with your health care provider. Document Revised: 08/30/2018 Document Reviewed: 06/02/2017 Elsevier Patient Education  2021 [employer].

## 2020-09-27 ENCOUNTER — Other Ambulatory Visit: Payer: Self-pay | Admitting: Family Medicine

## 2020-09-27 DIAGNOSIS — K219 Gastro-esophageal reflux disease without esophagitis: Secondary | ICD-10-CM

## 2020-10-10 ENCOUNTER — Other Ambulatory Visit: Payer: Self-pay | Admitting: Family Medicine

## 2020-10-10 MED ORDER — ANORO ELLIPTA 62.5-25 MCG/INH IN AEPB: 1.0000 | INHALATION_SPRAY | Freq: Every day | RESPIRATORY_TRACT | 11 refills | Status: DC

## 2020-10-14 ENCOUNTER — Telehealth: Payer: Self-pay | Admitting: Family Medicine

## 2020-10-14 MED ORDER — FUROSEMIDE 40 MG PO TABS
40.0000 mg | ORAL_TABLET | Freq: Every day | ORAL | 3 refills | Status: DC
Start: 2020-10-14 — End: 2021-08-07

## 2020-10-14 NOTE — Telephone Encounter (Signed)
Pt called in stating that he needs a refill of  furosemide (LASIX) 40 MG tablet [[id number]]  Sent to [facility].  Cb#: [phone]

## 2020-10-14 NOTE — Telephone Encounter (Signed)
Prescription sent to pharmacy.

## 2020-10-22 DIAGNOSIS — L814 Other melanin hyperpigmentation: Secondary | ICD-10-CM | POA: Diagnosis not present

## 2020-10-22 DIAGNOSIS — Z85828 Personal history of other malignant neoplasm of skin: Secondary | ICD-10-CM | POA: Diagnosis not present

## 2020-10-22 DIAGNOSIS — L570 Actinic keratosis: Secondary | ICD-10-CM | POA: Diagnosis not present

## 2020-10-22 DIAGNOSIS — D1801 Hemangioma of skin and subcutaneous tissue: Secondary | ICD-10-CM | POA: Diagnosis not present

## 2020-10-22 DIAGNOSIS — L853 Xerosis cutis: Secondary | ICD-10-CM | POA: Diagnosis not present

## 2020-10-22 DIAGNOSIS — L821 Other seborrheic keratosis: Secondary | ICD-10-CM | POA: Diagnosis not present

## 2020-10-22 DIAGNOSIS — L905 Scar conditions and fibrosis of skin: Secondary | ICD-10-CM | POA: Diagnosis not present

## 2020-11-28 ENCOUNTER — Other Ambulatory Visit: Payer: Self-pay

## 2020-11-28 ENCOUNTER — Encounter: Payer: Self-pay | Admitting: Family Medicine

## 2020-11-28 ENCOUNTER — Ambulatory Visit (INDEPENDENT_AMBULATORY_CARE_PROVIDER_SITE_OTHER): Payer: Medicare Other | Admitting: Family Medicine

## 2020-11-28 VITALS — BP 138/84 | HR 62 | Temp 97.9°F | Resp 16 | Ht 71.0 in | Wt 245.0 lb

## 2020-11-28 DIAGNOSIS — R0602 Shortness of breath: Secondary | ICD-10-CM

## 2020-11-28 DIAGNOSIS — M25552 Pain in left hip: Secondary | ICD-10-CM

## 2020-11-28 DIAGNOSIS — M7989 Other specified soft tissue disorders: Secondary | ICD-10-CM

## 2020-11-28 DIAGNOSIS — J449 Chronic obstructive pulmonary disease, unspecified: Secondary | ICD-10-CM

## 2020-11-28 NOTE — Progress Notes (Signed)
Subjective:    Patient ID: Brendan Hopkins, male    DOB: 11/24/1935, 85 y.o.   MRN: [id number]  HPI  Wt Readings from Last 3 Encounters:  11/28/20 245 lb (111.1 kg)  09/11/20 249 lb (112.9 kg)  05/06/20 252 lb (114.3 kg)  Patient had COVID in November of last year.  He was then diagnosed with a DVT in the spring 2021.  He states that he was doing great until he recently developed bronchitis.  He saw my partner last week who started him on Levaquin.  She obtained a chest x-ray which showed: Persistent bilateral interstitial prominence. Persistent bibasilar atelectasis. Small left pleural effusion.  I am not certain if the interstitial lung markings are due to pulmonary edema or chronic interstitial lung disease, or scarring due to his Covid infection that he had in November 2020.  He states that he is feeling better after the Levaquin.  He still has tremendous pleurisy in the right posterior chest just inferior to the scapula.  He is extremely tender to palpation in that area.  He believes that he broke a rib.  He remembers feeling a pop with coughing and he can still feel it "move" when he takes a deep inspiration.  However he also seems more short of breath and more easily winded despite an improving cough.  He becomes easily winded just talking to me and has to stop and catch his breath.  He also has swelling and has +1 pitting edema to his knees bilaterally.  He also has right-sided basilar crackles that I have not appreciated before.  His weight has trended upwards over the last 2 months.  He had an echocardiogram in September of last year which revealed a normal ejection fraction of 65%.  At that time, my plan was: Patient seems more short of breath than his baseline and despite the fact that he believes his cough is improving after the Levaquin I am concerned by his abnormal breath sounds, his increasing weight, and his pitting edema.  Therefore I will repeat a BNP and if elevated suggest a  referral to cardiology for possible congestive heart failure.  I will also check a D-dimer and if positive, will obtain a CT scan of the lungs to rule out a recurrent thromboembolic event particular given his pleurisy and chest pain and shortness of breath.  09/11/20 Patient is here today again complaining of swelling in his legs.  He has +1 edema in both legs distal to the knee.  However his pulmonary exam is stark.  He has diminished breath sounds all throughout.  There are no crackles or rails appreciated but he does have decreased air movement and faint expiratory wheezing.  He reports using albuterol at night and seeing significant improvement with this.  The patient smoked more than 40 years.  The last 10 years he was smoking 2 packs of cigarettes a day.  He has never had pulmonary function test performed but I suspect that he has moderate to severe COPD based on his pulmonary exam today.  At that time, my plan was:  Pulmonary exam is stark.  I recommended trying Anoro 1 inhalation a day for the next 3 weeks to see if he notices improvement.  We discussed a referral to pulmonology for pulmonary function test however his breath sounds are clearly abnormal and therefore I believe he has COPD.  He would like to try the medication empirically first and see if he notices improvement.  I believe  some of the swelling may be due to to right heart strain from his underlying chronic pulmonary disease.  Reassess in 3 weeks to see if his breathing improves on anoro. If there is no improvement in his dyspnea on exertion, I will consult pulmonology for pulmonary function test and consider cardiology referral for a stress test/ECHO  11/28/20  Patient states that the Anoro has helped his breathing dramatically.  His dyspnea on exertion has improved considerably however he still has shortness of breath with activity and he continues to report worsening swelling in his legs.  He has +1 pitting edema in his right leg and  +2 pitting edema in his left leg despite taking Lasix.  He denies any orthopnea or paroxysmal nocturnal dyspnea.  He denies any chest pain or angina.  He has known osteoarthritis in his left hip which is gotten steadily worse.  He would like to see his orthopedist to discuss hip replacement.  Previously has seen Dr. Alvan Dame. Past Medical History:  Diagnosis Date   Acute deep vein thrombosis (DVT) of tibial vein of right lower extremity (HCC)    after ankle fracture/surgery (2/21)   Arthritis    RIGHT HIP, HANDS, BACK   Blood clot in vein    right lower leg   Cancer (HCC)    Skin cancer    Cataract    Cervical radiculopathy    severe C3-4 neuroforaminal stenosis, C5-6 left synovial cyst compressing left nerve root.   Colon polyps    2005   COPD (chronic obstructive pulmonary disease) ([place])    SMOKED FOR 74 YRS   Diverticulosis    2005   Fatty liver    GERD (gastroesophageal reflux disease)    Hard of hearing    History of kidney stones    Hypertension    Leg fracture, right    Pain    RIGHT HIP AND BACK   Personal history of colonic polyps-adenoma and sessile serrated polyp 07/15/2010   Pneumonia    RBBB    POSS HX AF IN PAST- PAC'S   Shortness of breath    WITH EXERTION ONLY   TIA (transient ischemic attack)    Wears glasses    Past Surgical History:  Procedure Laterality Date   COLONOSCOPY     maybe 3    COLONOSCOPY W/ POLYPECTOMY     CYSTOSCOPY WITH RETROGRADE PYELOGRAM, URETEROSCOPY AND STENT PLACEMENT Left 12/11/2019   Procedure: CYSTOSCOPY WITH LEFT RETROGRADE LEFT  URETEROSCOPY WITH HOLMIUM LASER AND STENT PLACEMENT;  Surgeon: Irine Seal, MD;  Location: [facility];  Service: Urology;  Laterality: Left;   ORIF ANKLE FRACTURE Right 07/11/2019   ORIF ANKLE FRACTURE Right 07/11/2019   Procedure: OPEN REDUCTION INTERNAL FIXATION (ORIF) ANKLE FRACTURE;  Surgeon: Shona Needles, MD;  Location: [facility];  Service: Orthopedics;  Laterality: Right;   skin cancers removed      TOTAL  HIP ARTHROPLASTY Right 01/09/2013   Procedure: RIGHT TOTAL HIP ARTHROPLASTY ANTERIOR APPROACH;  Surgeon: [name], MD;  Location: [facility];  Service: Orthopedics;  Laterality: Right;   Current Outpatient Medications on File Prior to Visit  Medication Sig Dispense Refill   acetaminophen (TYLENOL) 650 MG CR tablet Take 650 mg by mouth every 8 (eight) hours as needed for pain.     albuterol (VENTOLIN HFA) 108 (90 Base) MCG/ACT inhaler Inhale 1-2 puffs into the lungs every 6 (six) hours as needed for wheezing or shortness of breath. 1 each 6   amLODipine (NORVASC)  5 MG tablet Take 1 tablet (5 mg total) by mouth at bedtime. 90 tablet 3   fish oil-omega-3 fatty acids 1000 MG capsule Take 1 g by mouth daily.     furosemide (LASIX) 40 MG tablet Take 1 tablet (40 mg total) by mouth daily. 90 tablet 3   HYDROcodone-acetaminophen (NORCO/VICODIN) 5-325 MG tablet Take 1 tablet by mouth every 6 (six) hours as needed for moderate pain. 15 tablet 0   meclizine (ANTIVERT) 25 MG tablet TAKE ONE TABLET BY MOUTH THREE TIMES DAILY AS NEEDED FOR DIZZINESS 90 tablet 2   Multiple Vitamin (MULTIVITAMIN WITH MINERALS) TABS Take 1 tablet by mouth daily.     oxyCODONE-acetaminophen (PERCOCET) 7.5-325 MG tablet Take 1 tablet by mouth every 4 (four) hours as needed for severe pain (due to broken rib). 30 tablet 0   pantoprazole (PROTONIX) 40 MG tablet Take 1 tablet by mouth once daily 30 tablet 11   umeclidinium-vilanterol (ANORO ELLIPTA) 62.5-25 MCG/INH AEPB Inhale 1 puff into the lungs daily. 1 each 11   valsartan (DIOVAN) 80 MG tablet Take 1 tablet (80 mg total) by mouth daily. 90 tablet 3   zolpidem (AMBIEN) 10 MG tablet Take 1 tablet (10 mg total) by mouth at bedtime as needed for sleep. 90 tablet 1   No current facility-administered medications on file prior to visit.   No Known Allergies Social History   Socioeconomic History   Marital status: Widowed    Spouse name: Not on file   Number of children: 2    Years of education: 68   Highest education level: Not on file  Occupational History   Occupation: Retired  Tobacco Use   Smoking status: Former    Years: 57.00    Pack years: 0.00    Types: Cigarettes    Quit date: 05/25/2007    Years since quitting: 13.5   Smokeless tobacco: Never  Vaping Use   Vaping Use: Never used  Substance and Sexual Activity   Alcohol use: No    Alcohol/week: 0.0 standard drinks   Drug use: No   Sexual activity: Not on file  Other Topics Concern   Not on file  Social History Narrative   Widow    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  Constitutional:      General: He is not in acute distress.    Appearance: Normal appearance. He is normal weight. He is not ill-appearing or toxic-appearing.  Cardiovascular:     Rate and Rhythm: Normal rate and regular rhythm.     Heart sounds: Normal heart sounds.  Pulmonary:     Effort: Pulmonary effort is normal. No respiratory distress.     Breath sounds: Decreased air movement present. No stridor. Decreased breath sounds and wheezing present. No rhonchi or rales.  Chest:     Chest wall: No tenderness.  Musculoskeletal:     Right lower leg: Edema present.     Left lower leg: Edema present.  Skin:    Findings: No erythema or rash.  Neurological:  Mental Status: He is alert.          Assessment & Plan:    Chronic obstructive pulmonary disease, unspecified COPD type ([place]) - Plan: ECHOCARDIOGRAM COMPLETE  Shortness of breath - Plan: ECHOCARDIOGRAM COMPLETE  Leg swelling - Plan: ECHOCARDIOGRAM  COMPLETE Patient has responded to Anoro but still has decreased breath sounds and wheezing.  Therefore I feel that his shortness of breath and dyspnea on exertion is most likely due to COPD rather than an underlying cardiac issue.  Continue Anoro.  We discussed a referral to pulmonology for pulmonary function test but he declines this at the present time.  I believe the leg swelling is likely due to venous insufficiency however I cannot rule out pulmonary hypertension secondary to his COPD or right-sided heart strain.  Therefore I would like to get an echocardiogram to evaluate further.  If echocardiogram is normal, I will simply increase his furosemide to compensate.  I will consult orthopedics for his left hip pain

## 2020-12-04 ENCOUNTER — Telehealth: Payer: Self-pay | Admitting: Pharmacist

## 2020-12-04 NOTE — Progress Notes (Addendum)
    Chronic Care Management Pharmacy Assistant   Name: Brendan Hopkins  MRN: [id number] DOB: [birth year]  Reason for Encounter: Disease State For COPD.    Conditions to be addressed/monitored: HTN, COPD, GERD, HLD, Insomnia  Recent office visits:  11/28/20 Dr. Dennard Schaumann For follow-up. No medication changes.   Recent consult visits:  None since 09/22/20  Hospital visits:  None since 09/22/20  Medications: Outpatient Encounter Medications as of 12/04/2020  Medication Sig   acetaminophen (TYLENOL) 650 MG CR tablet Take 650 mg by mouth every 8 (eight) hours as needed for pain.   albuterol (VENTOLIN HFA) 108 (90 Base) MCG/ACT inhaler Inhale 1-2 puffs into the lungs every 6 (six) hours as needed for wheezing or shortness of breath. (Patient not taking: Reported on 11/28/2020)   amLODipine (NORVASC) 5 MG tablet Take 1 tablet (5 mg total) by mouth at bedtime.   furosemide (LASIX) 40 MG tablet Take 1 tablet (40 mg total) by mouth daily.   meclizine (ANTIVERT) 25 MG tablet TAKE ONE TABLET BY MOUTH THREE TIMES DAILY AS NEEDED FOR DIZZINESS   Multiple Vitamin (MULTIVITAMIN WITH MINERALS) TABS Take 1 tablet by mouth daily.   pantoprazole (PROTONIX) 40 MG tablet Take 1 tablet by mouth once daily   umeclidinium-vilanterol (ANORO ELLIPTA) 62.5-25 MCG/INH AEPB Inhale 1 puff into the lungs daily.   valsartan (DIOVAN) 80 MG tablet Take 1 tablet (80 mg total) by mouth daily.   zolpidem (AMBIEN) 10 MG tablet Take 1 tablet (10 mg total) by mouth at bedtime as needed for sleep.   No facility-administered encounter medications on file as of 12/04/2020.   Current COPD regimen:  Ventolin HFA 90 mcg prn Anoro Ellipta daily - Inhale 1 puff into the lungs daily.  No flowsheet data found.  Any recent hospitalizations or ED visits since last visit with CPP? Patient stated no.  Reports COPD symptoms, including Symptoms worse with exercise  What recent interventions/DTPs have been made by any provider to improve  breathing since last visit: None.  Have you had exacerbation/flare-up since last visit? Patient stated no.  What do you do when you are short of breath?  Adhere to COPD Action Plan, Rescue medication, and Rest  Respiratory Devices/Equipment Do you have a nebulizer? Patient stated No  Do you use a Peak Flow Meter? Patient stated No  Do you use a maintenance inhaler? Patient stated Yes  How often do you forget to use your daily inhaler? Patient stated never.  Do you use a rescue inhaler? Patient stated Yes  How often do you use your rescue inhaler? Patient stated daily  Do you use a spacer with your inhaler? Patient stated No  Adherence Review: Does the patient have >5 day gap between last estimated fill date for maintenance inhaler medications? Per misc rpts, no.  Star Rating Drugs: Valsartan 80 mg 90 DS 11/10/20  Follow-Up:Pharmacist Review  Charlann Lange, RMA Clinical Pharmacist Assistant [phone]  10 minutes spent in review, coordination, and documentation.  Reviewed by: Beverly Milch, PharmD Clinical Pharmacist [facility] [phone]

## 2020-12-17 ENCOUNTER — Ambulatory Visit (HOSPITAL_COMMUNITY): Payer: Medicare Other | Attending: Internal Medicine

## 2020-12-17 ENCOUNTER — Other Ambulatory Visit: Payer: Self-pay

## 2020-12-17 DIAGNOSIS — M7989 Other specified soft tissue disorders: Secondary | ICD-10-CM | POA: Diagnosis not present

## 2020-12-17 DIAGNOSIS — J449 Chronic obstructive pulmonary disease, unspecified: Secondary | ICD-10-CM | POA: Insufficient documentation

## 2020-12-17 DIAGNOSIS — R0602 Shortness of breath: Secondary | ICD-10-CM | POA: Insufficient documentation

## 2020-12-17 LAB — ECHOCARDIOGRAM COMPLETE
AR max vel: 3.03 cm2
AV Area VTI: 3.08 cm2
AV Area mean vel: 2.92 cm2
AV Mean grad: 4 mmHg
AV Peak grad: 7.2 mmHg
Ao pk vel: 1.34 m/s
Area-P 1/2: 2.32 cm2
P 1/2 time: 678 msec
S' Lateral: 2.9 cm

## 2020-12-17 MED ORDER — PERFLUTREN LIPID MICROSPHERE
1.0000 mL | INTRAVENOUS | Status: AC | PRN
  Administered 2020-12-17: 3 mL via INTRAVENOUS

## 2021-01-07 DIAGNOSIS — M1612 Unilateral primary osteoarthritis, left hip: Secondary | ICD-10-CM | POA: Diagnosis not present

## 2021-01-07 DIAGNOSIS — Z96641 Presence of right artificial hip joint: Secondary | ICD-10-CM | POA: Diagnosis not present

## 2021-01-27 NOTE — Progress Notes (Deleted)
Chronic Care Management Pharmacy Note  01/27/2021 Name:  Brendan Hopkins MRN:  [id number] DOB:  November 17, 1935  Subjective: Brendan Hopkins is an 85 y.o. year old male who is a primary patient of Pickard, Cammie Mcgee, MD.  The CCM team was consulted for assistance with disease management and care coordination needs.    Engaged with patient face to face for follow up visit in response to provider referral for pharmacy case management and/or care coordination services.   Consent to Services:  The patient was given the following information about Chronic Care Management services today, agreed to services, and gave verbal consent: 1. CCM service includes personalized support from designated clinical staff supervised by the primary care provider, including individualized plan of care and coordination with other care providers 2. 24/7 contact phone numbers for assistance for urgent and routine care needs. 3. Service will only be billed when office clinical staff spend 20 minutes or more in a month to coordinate care. 4. Only one practitioner may furnish and bill the service in a calendar month. 5.The patient may stop CCM services at any time (effective at the end of the month) by phone call to the office staff. 6. The patient will be responsible for cost sharing (co-pay) of up to 20% of the service fee (after annual deductible is met). Patient agreed to services and consent obtained.  Patient Care Team: [name], MD as PCP - General (Family Medicine) Edythe Clarity, [facility] as Pharmacist (Pharmacist)   Recent office visits:  11/28/20 Dr. Dennard Schaumann For follow-up. No medication changes.    Recent consult visits:  None since 09/22/20   Hospital visits:  None since 09/22/20    Objective:  Lab Results  Component Value Date   CREATININE 1.04 05/06/2020   BUN 18 05/06/2020   GFRNONAA 67 05/06/2020   GFRAA 77 05/06/2020   NA 141 05/06/2020   K 5.0 05/06/2020   CALCIUM 9.3 05/06/2020   CO2 29  05/06/2020   GLUCOSE 131 (H) 05/06/2020    Lab Results  Component Value Date/Time   HGBA1C 6.0 (H) 01/26/2019 08:49 AM   HGBA1C 6.1 (H) 12/20/2017 10:08 AM    Last diabetic Eye exam: No results found for: HMDIABEYEEXA  Last diabetic Foot exam: No results found for: HMDIABFOOTEX   Lab Results  Component Value Date   CHOL 175 01/26/2019   HDL 46 01/26/2019   LDLCALC 107 (H) 01/26/2019   TRIG 121 01/26/2019   CHOLHDL 3.8 01/26/2019    Hepatic Function Latest Ref Rng & Units 05/06/2020 11/19/2019 11/18/2019  Total Protein 6.1 - 8.1 g/dL 6.4 6.3(L) 7.0  Albumin 3.5 - 5.0 g/dL - 3.2(L) 3.6  AST 10 - 35 U/L 20 18 21   ALT 9 - 46 U/L 13 14 17   Alk Phosphatase 38 - 126 U/L - 50 60  Total Bilirubin 0.2 - 1.2 mg/dL 0.5 0.6 1.1  Bilirubin, Direct 0.0 - 0.3 mg/dL - - -    Lab Results  Component Value Date/Time   TSH 3.630 12/10/2013 08:57 AM   TSH 4.306 09/22/2012 09:55 AM   FREET4 0.78 (L) 10/18/2008 11:45 AM    CBC Latest Ref Rng & Units 05/06/2020 12/07/2019 11/22/2019  WBC 3.8 - 10.8 Thousand/uL 9.9 6.2 6.7  Hemoglobin 13.2 - 17.1 g/dL 12.6(L) 13.3 11.9(L)  Hematocrit 38.5 - 50.0 % 38.9 41.3 35.9(L)  Platelets 140 - 400 Thousand/uL 268 354 210    Lab Results  Component Value Date/Time   VD25OH 35.64  07/11/2019 11:31 AM    Clinical ASCVD: No  The ASCVD Risk score Mikey Bussing DC Jr., et al., 2013) failed to calculate for the following reasons:   The 2013 ASCVD risk score is only valid for ages 81 to 50    Depression screen PHQ 2/9 03/11/2020 02/26/2020 01/25/2019  Decreased Interest 0 0 0  Down, Depressed, Hopeless 0 0 0  PHQ - 2 Score 0 0 0  Altered sleeping - - -  Tired, decreased energy - - -  Change in appetite - - -  Feeling bad or failure about yourself  - - -  Trouble concentrating - - -  Moving slowly or fidgety/restless - - -  Suicidal thoughts - - -  PHQ-9 Score - - -  Difficult doing work/chores - - -      Social History   Tobacco Use  Smoking Status Former    Years: 57.00   Types: Cigarettes   Quit date: 05/25/2007   Years since quitting: 13.6  Smokeless Tobacco Never   BP Readings from Last 3 Encounters:  11/28/20 138/84  09/11/20 128/74  05/06/20 130/70   Pulse Readings from Last 3 Encounters:  11/28/20 62  09/11/20 74  05/06/20 75   Wt Readings from Last 3 Encounters:  11/28/20 245 lb (111.1 kg)  09/11/20 249 lb (112.9 kg)  05/06/20 252 lb (114.3 kg)   BMI Readings from Last 3 Encounters:  11/28/20 34.17 kg/m  09/11/20 34.73 kg/m  05/06/20 35.15 kg/m    Assessment/Interventions: Review of patient past medical history, allergies, medications, health status, including review of consultants reports, laboratory and other test data, was performed as part of comprehensive evaluation and provision of chronic care management services.   SDOH:  (Social Determinants of Health) assessments and interventions performed: Yes   Financial Resource Strain: Low Risk    Difficulty of Paying Living Expenses: Not very hard    SDOH Screenings   Alcohol Screen: Low Risk    Last Alcohol Screening Score (AUDIT): 0  Depression (PHQ2-9): Low Risk    PHQ-2 Score: 0  Financial Resource Strain: Low Risk    Difficulty of Paying Living Expenses: Not very hard  Food Insecurity: Not on file  Housing: Not on file  Physical Activity: Not on file  Social Connections: Not on file  Stress: Not on file  Tobacco Use: Medium Risk   Smoking Tobacco Use: Former   Smokeless Tobacco Use: Never  Transportation Needs: Not on file    [place]  No Known Allergies  Medications Reviewed Today     Reviewed by Six, Eden Lathe, LPN (Licensed Practical Nurse) on 11/28/20 at [place] List Status: <None>   Medication Order Taking? Sig Documenting Provider Last Dose Status Informant  acetaminophen (TYLENOL) 650 MG CR tablet [id number] Yes Take 650 mg by mouth every 8 (eight) hours as needed for pain. [provider] Taking Active Self   albuterol (VENTOLIN HFA) 108 (90 Base) MCG/ACT inhaler [id number] No Inhale 1-2 puffs into the lungs every 6 (six) hours as needed for wheezing or shortness of breath.  Patient not taking: Reported on 11/28/2020   [name], MD Not Taking Active   amLODipine (NORVASC) 5 MG tablet [id number] Yes Take 1 tablet (5 mg total) by mouth at bedtime. [name], MD Taking Active   furosemide (LASIX) 40 MG tablet [id number] Yes Take 1 tablet (40 mg total) by mouth daily. [name], MD Taking Active   meclizine (ANTIVERT) 25  MG tablet [id number] Yes TAKE ONE TABLET BY MOUTH THREE TIMES DAILY AS NEEDED FOR DIZZINESS [name], MD Taking Active   Multiple Vitamin (MULTIVITAMIN WITH MINERALS) TABS [id number] Yes Take 1 tablet by mouth daily. [provider] Taking Active Self  pantoprazole (PROTONIX) 40 MG tablet [id number] Yes Take 1 tablet by mouth once daily [name], MD Taking Active   umeclidinium-vilanterol [facility] ELLIPTA) 62.5-25 MCG/INH AEPB [id number] Yes Inhale 1 puff into the lungs daily. [name], MD Taking Active   valsartan (DIOVAN) 80 MG tablet [id number] Yes Take 1 tablet (80 mg total) by mouth daily. [name], MD Taking Active   zolpidem (AMBIEN) 10 MG tablet [id number] Yes Take 1 tablet (10 mg total) by mouth at bedtime as needed for sleep. [name], MD Taking Active   Med List Note Julieta Gutting 07/14/20 0086): e            Patient Active Problem List   Diagnosis Date Noted   SIRS (systemic inflammatory response syndrome) ([place]) 11/18/2019   COPD (chronic obstructive pulmonary disease) ([place])    Status post ORIF of fracture of ankle    AKI (acute kidney injury) ([place])    Hematuria    Insomnia    GERD (gastroesophageal reflux disease)    Acute deep vein thrombosis (DVT) of tibial vein of right lower extremity (HCC)    Ankle syndesmosis disruption, right, initial encounter 07/16/2019   Closed right ankle fracture  07/11/2019   Closed displaced trimalleolar fracture of right lower leg 07/09/2019   Obese 01/10/2013   S/P right THA, AA 01/09/2013   Dyspnea 10/20/2012   Preop cardiovascular exam 10/20/2012   Bruit 10/20/2012   HLD (hyperlipidemia) 12/02/2011   Impaired glucose tolerance 12/02/2011   TIA (transient ischemic attack) 12/01/2011   H/O atrial fibrillation without current medication 12/01/2011   HTN (hypertension) 12/01/2011   Arthritis 12/01/2011   Personal history of colonic polyps-adenoma and sessile serrated polyp 07/15/2010    Immunization History  Administered Date(s) Administered   Fluad Quad(high Dose 65+) 01/25/2019, 02/21/2020   Influenza Split 02/15/2011   Influenza, High Dose Seasonal PF 02/17/2018   Influenza,inj,Quad PF,6+ Mos 04/09/2013, 02/12/2014, 03/14/2015, 03/04/2016, 03/02/2017   PFIZER(Purple Top)SARS-COV-2 Vaccination 08/16/2019, 09/06/2019, 03/17/2020, 11/07/2020   Pneumococcal Conjugate-13 04/09/2013   Pneumococcal Polysaccharide-23 12/19/2014   Td 09/10/2003   Zoster Recombinat (Shingrix) 11/07/2020    Conditions to be addressed/monitored:  HTN, COPD, GERD, HLD, [place]  There are no care plans that you recently modified to display for this patient.    Medication Assistance: Application for An[employer]  medication assistance program. in process.  Anticipated assistance start date unknown at this time.  See plan of care for additional detail.  Patient's preferred pharmacy is:  [facility] ([place]), [place] - [address] [place] [address] Phone: [phone] Fax: [place], [place] [address] [place] [address] Phone: [phone] Fax: [phone]  Uses pill box? Yes Pt endorses 100% compliance  We discussed: Benefits of medication synchronization, packaging and delivery as well as enhanced pharmacist oversight with [employer]. Patient decided to:  Continue current medication management strategy  Care Plan and Follow Up Patient Decision:  Patient agrees to Care Plan and Follow-up.  Plan: The care management team will reach out to the patient again over the next 120 days.  Beverly Milch, PharmD Clinical Pharmacist [place] [phone]  Current Barriers:  Unable to achieve control of shortness of breath   Pharmacist Clinical Goal(s):  Patient will achieve control of breathing  as evidenced by symptoms adhere to prescribed medication regimen as evidenced by fill dates contact provider office for questions/concerns as evidenced notation of same in electronic health record through collaboration with PharmD and provider.   Interventions: 1:1 collaboration with [name], MD regarding development and update of comprehensive plan of care as evidenced by provider attestation and co-signature Inter-disciplinary care team collaboration (see longitudinal plan of care) Comprehensive medication review performed; medication list updated in electronic medical record  Hypertension (BP goal <140/90) -Controlled -Current treatment: Amlodipine 15m daily Valsartan 866mdaily -Medications previously tried: HCTZ 2530maily -Current home readings: "normal" per patient no logs available today, he does monitor at home at least a few times per week -Current exercise habits: minimal since he has had COVID due to SOB, slowly getting better -Denies hypotensive/hypertensive symptoms -Educated on BP goals and benefits of medications for prevention of heart attack, stroke and kidney damage; Exercise goal of 150 minutes per week; Importance of home blood pressure monitoring; Symptoms of hypotension and importance of maintaining adequate hydration; -Counseled to monitor BP at home a few times per week, document, and provide log at future appointments -Recommended to continue current medication  Hyperlipidemia: (LDL goal <  100) -Not ideally controlled -Current treatment: Fish oil 1000m56mily -Medications previously tried: none noted  -Current exercise habits: see above -Educated on Cholesterol goals;  Importance of limiting foods high in cholesterol; Exercise goal of 150 minutes per week;  -Patient would benefit from updated lipid panel -Recommended updated lipid panel for updated LDL results and proper med management.  COPD? (Goal: control symptoms and prevent exacerbations) -Not ideally controlled -Current treatment  Ventolin HFA 90 mcg prn Anoro Ellipta 62.5-25 mcg daily -Medications previously tried: none noted  -Exacerbations requiring treatment in last 6 months: none -Patient reports consistent use of maintenance inhaler -Frequency of rescue inhaler use: at bedtime -Counseled on Proper inhaler technique; Benefits of consistent maintenance inhaler use When to use rescue inhaler Differences between maintenance and rescue inhalers  -Patient reports that his samples of Anoro are really helping his breathing and ability to walk without getting short of breath.  He was given another sample in office today.  He is to call me before he begins his last sample so that we can get a prescription sent in and start the patient assistance process for this. -Recommended to continue current medication Assessed patient finances. Based on income patient believed to be qualified for patient assistance programs for copay help.  GERD (Goal: Minimize symptoms) -Controlled -Current treatment  Pantoprazole 40mg84mly -Medications previously tried: none noted -Discussed appropriate timing of medication, 30-60 minutes before most symptomatic meal on an empty stomach. -Recommended to continue current medication  Insomnia (Goal: Adequate sleep) -Controlled -Current treatment  Zolpidem 10mg 82my -Medications previously tried: none noted -Reports sleep has gotten easier as his breathing has gotten  better. -Recommended to continue current medication   Patient Goals/Self-Care Activities Patient will:  - take medications as prescribed focus on medication adherence by fill dates check blood pressure a few times per week, document, and provide at future appointments collaborate with provider on medication access solutions  Follow Up Plan: The care management team will reach out to the patient again over the next 120 days.

## 2021-01-29 ENCOUNTER — Telehealth: Payer: Self-pay

## 2021-01-30 ENCOUNTER — Telehealth: Payer: Self-pay

## 2021-01-30 NOTE — Telephone Encounter (Signed)
Awaiting forms

## 2021-01-30 NOTE — Telephone Encounter (Signed)
Pt came in with form needing completion for a concealed weapon. Form placed in nurse's folder. Please call pt when form is ready to be picked up.   Cb#: [phone]

## 2021-01-30 NOTE — Telephone Encounter (Signed)
Received form.   Noted that form is for patient to complete with PCP information.   Advised to have patient return to pick up forms.

## 2021-02-08 ENCOUNTER — Other Ambulatory Visit: Payer: Self-pay | Admitting: Family Medicine

## 2021-02-09 ENCOUNTER — Other Ambulatory Visit: Payer: Self-pay

## 2021-02-09 MED ORDER — ZOLPIDEM TARTRATE 10 MG PO TABS: 10.0000 mg | ORAL_TABLET | Freq: Every evening | ORAL | 1 refills | Status: DC | PRN

## 2021-02-09 NOTE — Telephone Encounter (Signed)
Last filled 09/18/20 Last ov 12/18/20  Is this okay to fill ?

## 2021-02-10 ENCOUNTER — Ambulatory Visit (INDEPENDENT_AMBULATORY_CARE_PROVIDER_SITE_OTHER): Payer: Medicare Other | Admitting: *Deleted

## 2021-02-10 ENCOUNTER — Other Ambulatory Visit: Payer: Self-pay

## 2021-02-10 DIAGNOSIS — Z23 Encounter for immunization: Secondary | ICD-10-CM

## 2021-02-11 NOTE — Patient Instructions (Signed)
DUE TO COVID-19 ONLY ONE VISITOR IS ALLOWED TO COME WITH YOU AND STAY IN THE WAITING ROOM ONLY DURING PRE OP AND PROCEDURE DAY OF SURGERY IF YOU ARE GOING HOME AFTER SURGERY. IF YOU ARE SPENDING THE NIGHT 2 PEOPLE MAY VISIT WITH YOU IN YOUR PRIVATE ROOM AFTER SURGERY UNTIL VISITING  HOURS ARE OVER AT 800 PM AND THE 1 VISITORS CAN SPEND THE NIGHT.   YOU NEED TO HAVE A COVID 19 TEST ON__9/30___ THIS TEST MUST BE DONE BEFORE SURGERY,  COVID TESTING SITE  IS LOCATED AT [place], [place]. REMAIN IN YOUR CAR THIS IS A DRIVE UP TEST. AFTER YOUR COVID TEST PLEASE WEAR A MASK OUT IN PUBLIC AND SOCIAL DISTANCE AND [place] YOUR HANDS FREQUENTLY, ALSO ASK ALL YOUR CLOSE CONTACT PERSONS TO WEAR A MASK AND SOCIAL DISTANCE AND [place] THEIR HANDS FREQUENTLY ALSO.               [name]     Your procedure is scheduled on: 02/24/21   Report to [facility] Main  Entrance   Report to short stay at  5:55 AM     Call this number if you have problems the morning of surgery [phone]   No food after midnight.    You may have clear liquid until 5:30 AM.    At 5:00 AM drink pre surgery drink.   Nothing by mouth after 4530 AM.    CLEAR LIQUID DIET   Foods Allowed                                                                     Foods Excluded No milk or creamer Coffee and tea, regular and decaf                             liquids that you cannot  Plain Jell-O any favor except red or purple                                           see through such as: Fruit ices (not with fruit pulp)                                     milk, soups, orange juice  Iced Popsicles                                    All solid food Carbonated beverages, regular and diet                                    Cranberry, grape and apple juices Sports drinks like Gatorade Lightly seasoned clear broth or consume(fat free) Sugar  _   BRUSH YOUR TEETH MORNING OF SURGERY AND RINSE YOUR MOUTH OUT, NO  CHEWING GUM CANDY OR MINTS.     Take these medicines the morning  of surgery with A SIP OF WATER: Pantoprazole, Use your inhalers and bring them with you                                You may not have any metal on your body including              piercings  Do not wear jewelry, lotions, powders or deodorant             Men may shave face and neck.   Do not bring valuables to the hospital. [place].  Contacts, dentures or bridgework may not be worn into surgery.  Leave suitcase in the car. After surgery it may be brought to your room.                Please read over the following fact sheets you were given: _____________________________________________________________________             [facility] - Preparing for Surgery Before surgery, you can play an important role.  Because skin is not sterile, your skin needs to be as free of germs as possible.  You can reduce the number of germs on your skin by washing with CHG (chlorahexidine gluconate) soap before surgery.  CHG is an antiseptic cleaner which kills germs and bonds with the skin to continue killing germs even after washing. Please DO NOT use if you have an allergy to CHG or antibacterial soaps.  If your skin becomes reddened/irritated stop using the CHG and inform your nurse when you arrive at Short Stay.   You may shave your face/neck. Please follow these instructions carefully:  1.  Shower with CHG Soap the night before surgery and the  morning of Surgery.  2.  If you choose to wash your hair, wash your hair first as usual with your  normal  shampoo.  3.  After you shampoo, rinse your hair and body thoroughly to remove the  shampoo.                            4.  Use CHG as you would any other liquid soap.  You can apply chg directly  to the skin and wash                       Gently with a scrungie or clean washcloth.  5.  Apply the CHG Soap to your body ONLY FROM THE NECK  DOWN.   Do not use on face/ open                           Wound or open sores. Avoid contact with eyes, ears mouth and genitals (private parts).                       Wash face,  Genitals (private parts) with your normal soap.             6.  Wash thoroughly, paying special attention to the area where your surgery  will be performed.  7.  Thoroughly rinse your body with warm water from the neck down.  8.  DO NOT shower/wash with your normal soap after using and rinsing off  the CHG Soap.                9.  Pat yourself dry with a clean towel.            10.  Wear clean pajamas.            11.  Place clean sheets on your bed the night of your first shower and do not  sleep with pets. Day of Surgery : Do not apply any lotions/deodorants the morning of surgery.  Please wear clean clothes to the hospital/surgery center.  FAILURE TO FOLLOW THESE INSTRUCTIONS MAY RESULT IN THE CANCELLATION OF YOUR SURGERY PATIENT SIGNATURE_________________________________  NURSE SIGNATURE__________________________________  ________________________________________________________________________   Adam [place]ix  An incentive spirometer is a tool that can help keep your lungs clear and active. This tool measures how well you are filling your lungs with each breath. Taking long deep breaths may help reverse or decrease the chance of developing breathing (pulmonary) problems (especially infection) following: A long period of time when you are unable to move or be active. BEFORE THE PROCEDURE  If the spirometer includes an indicator to show your best effort, your nurse or respiratory therapist will set it to a desired goal. If possible, sit up straight or lean slightly forward. Try not to slouch. Hold the incentive spirometer in an upright position. INSTRUCTIONS FOR USE  Sit on the edge of your bed if possible, or sit up as far as you can in bed or on a chair. Hold the incentive spirometer in an upright  position. Breathe out normally. Place the mouthpiece in your mouth and seal your lips tightly around it. Breathe in slowly and as deeply as possible, raising the piston or the ball toward the top of the column. Hold your breath for 3-5 seconds or for as long as possible. Allow the piston or ball to fall to the bottom of the column. Remove the mouthpiece from your mouth and breathe out normally. Rest for a few seconds and repeat Steps 1 through 7 at least 10 times every 1-2 hours when you are awake. Take your time and take a few normal breaths between deep breaths. The spirometer may include an indicator to show your best effort. Use the indicator as a goal to work toward during each repetition. After each set of 10 deep breaths, practice coughing to be sure your lungs are clear. If you have an incision (the cut made at the time of surgery), support your incision when coughing by placing a pillow or rolled up towels firmly against it. Once you are able to get out of bed, walk around indoors and cough well. You may stop using the incentive spirometer when instructed by your caregiver.  RISKS AND COMPLICATIONS Take your time so you do not get dizzy or light-headed. If you are in pain, you may need to take or ask for pain medication before doing incentive spirometry. It is harder to take a deep breath if you are having pain. AFTER USE Rest and breathe slowly and easily. It can be helpful to keep track of a log of your progress. Your caregiver can provide you with a simple table to help with this. If you are using the spirometer at home, follow these instructions: [place] IF:  You are having difficultly using the spirometer. You have trouble using the spirometer as often as instructed. Your pain medication is not giving enough relief while using the spirometer. You develop fever of 100.5 F (  38.1 C) or higher. SEEK IMMEDIATE MEDICAL CARE IF:  You cough up bloody sputum that had not been  present before. You develop fever of 102 F (38.9 C) or greater. You develop worsening pain at or near the incision site. MAKE SURE YOU:  Understand these instructions. Will watch your condition. Will get help right away if you are not doing well or get worse. Document Released: 09/20/2006 Document Revised: 08/02/2011 Document Reviewed: 11/21/2006 [facility] Patient Information 2014 [place], [place].   ________________________________________________________________________

## 2021-02-12 ENCOUNTER — Encounter (HOSPITAL_COMMUNITY): Payer: Self-pay

## 2021-02-12 ENCOUNTER — Encounter (HOSPITAL_COMMUNITY)
Admission: RE | Admit: 2021-02-12 | Discharge: 2021-02-12 | Disposition: A | Discharge status: Home / Self Care | Payer: Medicare Other | Source: Ambulatory Visit | Attending: Orthopedic Surgery | Admitting: Orthopedic Surgery

## 2021-02-12 ENCOUNTER — Other Ambulatory Visit: Payer: Self-pay

## 2021-02-12 DIAGNOSIS — Z01818 Encounter for other preprocedural examination: Secondary | ICD-10-CM | POA: Diagnosis not present

## 2021-02-12 LAB — COMPREHENSIVE METABOLIC PANEL
ALT: 16 U/L (ref 0–44)
AST: 23 U/L (ref 15–41)
Albumin: 4.4 g/dL (ref 3.5–5.0)
Alkaline Phosphatase: 69 U/L (ref 38–126)
Anion gap: 10 (ref 5–15)
BUN: 23 mg/dL (ref 8–23)
CO2: 24 mmol/L (ref 22–32)
Calcium: 9.3 mg/dL (ref 8.9–10.3)
Chloride: 101 mmol/L (ref 98–111)
Creatinine, Ser: 1.08 mg/dL (ref 0.61–1.24)
GFR, Estimated: >60 mL/min (ref >60)
Glucose, Bld: 112 mg/dL — ABNORMAL HIGH (ref 70–99)
Potassium: 4.4 mmol/L (ref 3.5–5.1)
Sodium: 135 mmol/L (ref 135–145)
Total Bilirubin: 1.1 mg/dL (ref 0.3–1.2)
Total Protein: 8 g/dL (ref 6.5–8.1)

## 2021-02-12 LAB — TYPE AND SCREEN
ABO/RH(D): A POS
Antibody Screen: NEGATIVE

## 2021-02-12 LAB — CBC
HCT: 46.5 % (ref 39.0–52.0)
Hemoglobin: 15.2 g/dL (ref 13.0–17.0)
MCH: 30.1 pg (ref 26.0–34.0)
MCHC: 32.7 g/dL (ref 30.0–36.0)
MCV: 92.1 fL (ref 80.0–100.0)
Platelets: 194 10*3/uL (ref 150–400)
RBC: 5.05 MIL/uL (ref 4.22–5.81)
RDW: 14.7 % (ref 11.5–15.5)
WBC: 6.8 10*3/uL (ref 4.0–10.5)
nRBC: 0 % (ref 0.0–0.2)

## 2021-02-12 LAB — SURGICAL PCR SCREEN
MRSA, PCR: NEGATIVE
Staphylococcus aureus: NEGATIVE

## 2021-02-12 NOTE — Progress Notes (Signed)
COVID test Completed:02/20/21  PCP - Dr/ W. Pickard LOV7/8/22 Cardiologist -   Chest x-r9/22/22-chart Stress Test - no ECHO - 12/17/20-epic Cardiac Cath - no Pacemaker/ICD device last checked:NA  Sleep Study - no CPAP -   Fasting Blood Sugar - NA Checks Blood Sugar _____ times a day  Blood Thinner Instructions:ASA 81 / Dr. Dennard Schaumann Aspirin Instructions:stop 5 days prior to DOS/ Dr. Alvan Dame  Last Dose:02/18/21  Anesthesia review: Yes  Patient denies shortness of breath, fever, cough and chest pain at PAT appointment Pt has COPD and had covid that resulted in SOB with most activities involving walking and climbing stairs.  Patient verbalized understanding of instructions that were given to them at the PAT appointment. Patient was also instructed that they will need to review over the PAT instructions again at home before surgery. yes

## 2021-02-13 NOTE — Progress Notes (Signed)
Anesthesia Chart Review   Case: [id number] Date/Time: 02/24/21 0825   Procedure: TOTAL HIP ARTHROPLASTY ANTERIOR APPROACH (Left: Hip)   Anesthesia type: Spinal   Pre-op diagnosis: left hip osteoarthritis   Location: [facility] ROOM 10 / [facility]   Surgeons: Paralee Cancel, MD       DISCUSSION:85 y.o. former smoker with h/o HTN, GERD, COPD, RBBB, DVT, left hip OA scheduled for above procedure 02/24/2021 with Dr. Paralee Cancel.   Pt last seen by PCP 11/28/2020. Echo ordered at this visit.  Pt with shortness of breath, improvement with Anoro.   Echo 12/17/2020 with EF 55%, no valvular problems.   Anticipate pt can proceed with planned procedure barring acute status change.   VS: BP (!) 151/82   Pulse 76   Temp 36.6 C (Oral)   Resp 20   Ht 5\' 10"  (1.778 m)   Wt 111.6 kg   SpO2 96%   BMI 35.30 kg/m   PROVIDERS: [name], MD is PCP    LABS: Labs reviewed: Acceptable for surgery. (all labs ordered are listed, but only abnormal results are displayed)  Labs Reviewed  COMPREHENSIVE METABOLIC PANEL - Abnormal; Notable for the following components:      Result Value   Glucose, Bld 112 (*)    All other components within normal limits  SURGICAL PCR SCREEN  CBC  TYPE AND SCREEN     IMAGES:   EKG: 02/12/21 Rate 64 bpm  NSR RBBB LAFB  CV: Echo 12/17/2020  1. Left ventricular ejection fraction, by estimation, is 55%. The left  ventricle has low normal function. The left ventricle has no regional wall  motion abnormalities. There is moderate concentric left ventricular  hypertrophy. Left ventricular diastolic   parameters are consistent with Grade I diastolic dysfunction (impaired  relaxation).   2. Right ventricular systolic function is normal. The right ventricular  size is normal. Tricuspid regurgitation signal is inadequate for assessing  PA pressure.   3. The mitral valve is abnormal. No evidence of mitral valve  regurgitation. No evidence of mitral stenosis. Moderate  mitral annular  calcification.   4. The aortic valve is tricuspid. There is moderate calcification of the  aortic valve. Aortic valve regurgitation is trivial. Aortic valve  sclerosis/calcification is present, without any evidence of aortic  stenosis.   5. Aortic dilatation noted. There is mild dilatation of the aortic root,  measuring 40 mm. There is borderline dilatation of the ascending aorta,  measuring 39 mm.   6. The inferior vena cava is normal in size with greater than 50%  respiratory variability, suggesting right atrial pressure of 3 mmHg. Past Medical History:  Diagnosis Date   Acute deep vein thrombosis (DVT) of tibial vein of right lower extremity (HCC)    after ankle fracture/surgery (2/21)   Arthritis    RIGHT HIP, HANDS, BACK   Blood clot in vein    right lower leg   Cancer (HCC)    Skin cancer    Cataract    Cervical radiculopathy    severe C3-4 neuroforaminal stenosis, C5-6 left synovial cyst compressing left nerve root.   Colon polyps    2005   COPD (chronic obstructive pulmonary disease) ([place])    SMOKED FOR 83 YRS   Diverticulosis    2005   Fatty liver    GERD (gastroesophageal reflux disease)    Hard of hearing    History of kidney stones    Hypertension    Leg fracture, right  Pain    RIGHT HIP AND BACK   Personal history of colonic polyps-adenoma and sessile serrated polyp 07/15/2010   Pneumonia 03/2019   RBBB    POSS HX AF IN PAST- PAC'S   Shortness of breath    WITH EXERTION ONLY   Stroke ([place])    TIA   TIA (transient ischemic attack)    Wears glasses     Past Surgical History:  Procedure Laterality Date   COLONOSCOPY     maybe 3    COLONOSCOPY W/ POLYPECTOMY     CYSTOSCOPY WITH RETROGRADE PYELOGRAM, URETEROSCOPY AND STENT PLACEMENT Left 12/11/2019   Procedure: CYSTOSCOPY WITH LEFT RETROGRADE LEFT  URETEROSCOPY WITH HOLMIUM LASER AND STENT PLACEMENT;  Surgeon: Irine Seal, MD;  Location[facility];  Service: Urology;  Laterality: Left;    ORIF ANKLE FRACTURE Right 07/11/2019   ORIF ANKLE FRACTURE Right 07/11/2019   Procedure: OPEN REDUCTION INTERNAL FIXATION (ORIF) ANKLE FRACTURE;  Surgeon: Shona Needles, MD;  Location: [place];  Service: Orthopedics;  Laterality: Right;   skin cancers removed      TOTAL HIP ARTHROPLASTY Right 01/09/2013   Procedure: RIGHT TOTAL HIP ARTHROPLASTY ANTERIOR APPROACH;  Surgeon: [name], MD;  [facility];  Service: Orthopedics;  Laterality: Right;    MEDICATIONS:  acetaminophen (TYLENOL) 650 MG CR tablet   albuterol (VENTOLIN HFA) 108 (90 Base) MCG/ACT inhaler   amLODipine (NORVASC) 5 MG tablet   aspirin EC 81 MG tablet   Emu Oil OIL   furosemide (LASIX) 40 MG tablet   meclizine (ANTIVERT) 25 MG tablet   Multiple Vitamin (MULTIVITAMIN WITH MINERALS) TABS   Omega-3 Fatty Acids (FISH OIL) 1000 MG CAPS   pantoprazole (PROTONIX) 40 MG tablet   triamcinolone cream (KENALOG) 0.1 %   umeclidinium-vilanterol (ANORO ELLIPTA) 62.5-25 MCG/INH AEPB   valsartan (DIOVAN) 80 MG tablet   zolpidem (AMBIEN) 10 MG tablet   No current facility-administered medications for this encounter.    Konrad Felix Ward, PA-C [facility] [phone]

## 2021-02-19 NOTE — H&P (Signed)
TOTAL HIP ADMISSION H&P  Patient is admitted for left total hip arthroplasty.  Subjective:  Chief Complaint: left hip pain  HPI: Brendan Hopkins, 85 y.o. male, has a history of pain and functional disability in the left hip(s) due to arthritis and patient has failed non-surgical conservative treatments for greater than 12 weeks to include NSAID's and/or analgesics and activity modification.  Onset of symptoms was gradual starting 2 years ago with gradually worsening course since that time.The patient noted no past surgery on the left hip(s).  Patient currently rates pain in the left hip at 8 out of 10 with activity. Patient has worsening of pain with activity and weight bearing and pain that interfers with activities of daily living. Patient has evidence of joint space narrowing by imaging studies. This condition presents safety issues increasing the risk of falls. T There is no current active infection.  Patient Active Problem List   Diagnosis Date Noted   SIRS (systemic inflammatory response syndrome) ([place]) 11/18/2019   COPD (chronic obstructive pulmonary disease) (HCC)    Status post ORIF of fracture of ankle    AKI (acute kidney injury) ([place])    Hematuria    Insomnia    GERD (gastroesophageal reflux disease)    Acute deep vein thrombosis (DVT) of tibial vein of right lower extremity (HCC)    Ankle syndesmosis disruption, right, initial encounter 07/16/2019   Closed right ankle fracture 07/11/2019   Closed displaced trimalleolar fracture of right lower leg 07/09/2019   Obese 01/10/2013   S/P right THA, AA 01/09/2013   Dyspnea 10/20/2012   Preop cardiovascular exam 10/20/2012   Bruit 10/20/2012   HLD (hyperlipidemia) 12/02/2011   Impaired glucose tolerance 12/02/2011   TIA (transient ischemic attack) 12/01/2011   H/O atrial fibrillation without current medication 12/01/2011   HTN (hypertension) 12/01/2011   Arthritis 12/01/2011   Personal history of colonic polyps-adenoma and  sessile serrated polyp 07/15/2010   Past Medical History:  Diagnosis Date   Acute deep vein thrombosis (DVT) of tibial vein of right lower extremity (HCC)    after ankle fracture/surgery (2/21)   Arthritis    RIGHT HIP, HANDS, BACK   Blood clot in vein    right lower leg   Cancer (HCC)    Skin cancer    Cataract    Cervical radiculopathy    severe C3-4 neuroforaminal stenosis, C5-6 left synovial cyst compressing left nerve root.   Colon polyps    2005   COPD (chronic obstructive pulmonary disease) ([place])    SMOKED FOR 64 YRS   Diverticulosis    2005   Fatty liver    GERD (gastroesophageal reflux disease)    Hard of hearing    History of kidney stones    Hypertension    Leg fracture, right    Pain    RIGHT HIP AND BACK   Personal history of colonic polyps-adenoma and sessile serrated polyp 07/15/2010   Pneumonia 03/2019   RBBB    POSS HX AF IN PAST- PAC'S   Shortness of breath    WITH EXERTION ONLY   Stroke ([place])    TIA   TIA (transient ischemic attack)    Wears glasses     Past Surgical History:  Procedure Laterality Date   COLONOSCOPY     maybe 3    COLONOSCOPY W/ POLYPECTOMY     CYSTOSCOPY WITH RETROGRADE PYELOGRAM, URETEROSCOPY AND STENT PLACEMENT Left 12/11/2019   Procedure: CYSTOSCOPY WITH LEFT RETROGRADE LEFT  URETEROSCOPY WITH HOLMIUM  LASER AND STENT PLACEMENT;  Surgeon: Irine Seal, MD;  [facility];  Service: Urology;  Laterality: Left;   ORIF ANKLE FRACTURE Right 07/11/2019   ORIF ANKLE FRACTURE Right 07/11/2019   Procedure: OPEN REDUCTION INTERNAL FIXATION (ORIF) ANKLE FRACTURE;  Surgeon: Shona Needles, MD;  Location: [place];  Service: Orthopedics;  Laterality: Right;   skin cancers removed      TOTAL HIP ARTHROPLASTY Right 01/09/2013   Procedure: RIGHT TOTAL HIP ARTHROPLASTY ANTERIOR APPROACH;  Surgeon: [name], MD;  [facility];  Service: Orthopedics;  Laterality: Right;    No current facility-administered medications for this  encounter.   Current Outpatient Medications  Medication Sig Dispense Refill Last Dose   acetaminophen (TYLENOL) 650 MG CR tablet Take 650 mg by mouth every 6 (six) hours as needed for pain.      albuterol (VENTOLIN HFA) 108 (90 Base) MCG/ACT inhaler Inhale 1-2 puffs into the lungs every 6 (six) hours as needed for wheezing or shortness of breath. 1 each 6    amLODipine (NORVASC) 5 MG tablet Take 1 tablet (5 mg total) by mouth at bedtime. 90 tablet 3    aspirin EC 81 MG tablet Take 81 mg by mouth daily. Swallow whole.      Emu Oil OIL Apply 1 application topically daily as needed (sore muscles).      furosemide (LASIX) 40 MG tablet Take 1 tablet (40 mg total) by mouth daily. 90 tablet 3    meclizine (ANTIVERT) 25 MG tablet TAKE ONE TABLET BY MOUTH THREE TIMES DAILY AS NEEDED FOR DIZZINESS (Patient taking differently: Take 25 mg by mouth 3 (three) times daily as needed for dizziness. TAKE ONE TABLET BY MOUTH THREE TIMES DAILY AS NEEDED FOR DIZZINESS) 90 tablet 2    Multiple Vitamin (MULTIVITAMIN WITH MINERALS) TABS Take 1 tablet by mouth daily.      Omega-3 Fatty Acids (FISH OIL) 1000 MG CAPS Take 1,000 mg by mouth daily.      pantoprazole (PROTONIX) 40 MG tablet Take 1 tablet by mouth once daily 30 tablet 11    triamcinolone cream (KENALOG) 0.1 % Apply 1 application topically 2 (two) times daily as needed (irritation).      umeclidinium-vilanterol (ANORO ELLIPTA) 62.5-25 MCG/INH AEPB Inhale 1 puff into the lungs daily. 1 each 11    valsartan (DIOVAN) 80 MG tablet Take 1 tablet (80 mg total) by mouth daily. 90 tablet 3    zolpidem (AMBIEN) 10 MG tablet Take 1 tablet (10 mg total) by mouth at bedtime as needed for sleep. (Patient taking differently: Take 10 mg by mouth at bedtime.) 90 tablet 1    No Known Allergies  Social History   Tobacco Use   Smoking status: Former    Years: 57.00    Types: Cigarettes    Quit date: 05/25/2007    Years since quitting: 13.7   Smokeless tobacco: Never   Substance Use Topics   Alcohol use: No    Alcohol/week: 0.0 standard drinks    Family History  Problem Relation Age of Onset   Cancer Father    Cancer Mother        died more of old age at age 67   Colon cancer Neg Hx    Esophageal cancer Neg Hx    Prostate cancer Neg Hx    Rectal cancer Neg Hx    Stomach cancer Neg Hx      Review of Systems  Constitutional:  Negative for chills and  fever.  Respiratory:  Negative for cough and shortness of breath.   Cardiovascular:  Negative for chest pain.  Gastrointestinal:  Negative for nausea and vomiting.  Musculoskeletal:  Positive for arthralgias.    Objective:  Physical Exam Well nourished and well developed. General: Alert and oriented x3, cooperative and pleasant, no acute distress. Head: normocephalic, atraumatic, neck supple. Eyes: EOMI.  Musculoskeletal: Left hip exam: Painful and limited hip flexion to 5 degrees with pelvic tilting and pain External rotation of 20 degrees Neurovascular tact distally  Calves soft and nontender. Motor function intact in LE. Strength 5/5 LE bilaterally. Neuro: Distal pulses 2+. Sensation to light touch intact in LE.  Vital signs in last 24 hours:    Labs:   Estimated body mass index is 35.3 kg/m as calculated from the following:   Height as of 02/12/21: 5\' 10"  (1.778 m).   Weight as of 02/12/21: 111.6 kg.   Imaging Review Plain radiographs demonstrate severe degenerative joint disease of the left hip(s). The bone quality appears to be adequate for age and reported activity level.      Assessment/Plan:  End stage arthritis, left hip(s)  The patient history, physical examination, clinical judgement of the provider and imaging studies are consistent with end stage degenerative joint disease of the left hip(s) and total hip arthroplasty is deemed medically necessary. The treatment options including medical management, injection therapy, arthroscopy and arthroplasty were discussed  at length. The risks and benefits of total hip arthroplasty were presented and reviewed. The risks due to aseptic loosening, infection, stiffness, dislocation/subluxation,  thromboembolic complications and other imponderables were discussed.  The patient acknowledged the explanation, agreed to proceed with the plan and consent was signed. Patient is being admitted for inpatient treatment for surgery, pain control, PT, OT, prophylactic antibiotics, VTE prophylaxis, progressive ambulation and ADL's and discharge planning.The patient is planning to be discharged  home.  Therapy Plans: HEP Disposition: Home with granddaughter Planned DVT Prophylaxis: aspirin 81mg  BID DME needed: none PCP: Dr. Jenna Luo, TXA: IV Allergies: NKDA Anesthesia Concerns: none BMI: 36.8 Last HgbA1c: Not diabetic  Other: - Hx of DVT after right tibia fracture - Hx of ILD - exacerbated by COVID 2 years ago - Norco, robaxin - Chronic right rotator cuff tear - careful positioning    Griffith Citron, PA-C Orthopedic Surgery [facility] [place] [phone]

## 2021-02-20 ENCOUNTER — Other Ambulatory Visit: Payer: Self-pay | Admitting: Orthopedic Surgery

## 2021-02-24 ENCOUNTER — Ambulatory Visit (HOSPITAL_COMMUNITY): Payer: Medicare Other

## 2021-02-24 ENCOUNTER — Observation Stay (HOSPITAL_COMMUNITY)
Admission: RE | Admit: 2021-02-24 | Discharge: 2021-02-25 | Disposition: A | Discharge status: Home / Self Care | Payer: Medicare Other | Attending: Orthopedic Surgery | Admitting: Orthopedic Surgery

## 2021-02-24 ENCOUNTER — Encounter (HOSPITAL_COMMUNITY)
Admission: RE | Disposition: A | Discharge status: Home / Self Care | Payer: Self-pay | Source: Home / Self Care | Attending: Orthopedic Surgery

## 2021-02-24 ENCOUNTER — Ambulatory Visit (HOSPITAL_COMMUNITY): Payer: Medicare Other | Admitting: Physician Assistant

## 2021-02-24 ENCOUNTER — Observation Stay (HOSPITAL_COMMUNITY): Payer: Medicare Other

## 2021-02-24 ENCOUNTER — Other Ambulatory Visit: Payer: Self-pay

## 2021-02-24 ENCOUNTER — Encounter (HOSPITAL_COMMUNITY): Payer: Self-pay | Admitting: Orthopedic Surgery

## 2021-02-24 ENCOUNTER — Ambulatory Visit (HOSPITAL_COMMUNITY): Payer: Medicare Other | Admitting: Certified Registered"

## 2021-02-24 DIAGNOSIS — J449 Chronic obstructive pulmonary disease, unspecified: Secondary | ICD-10-CM | POA: Diagnosis not present

## 2021-02-24 DIAGNOSIS — Z87891 Personal history of nicotine dependence: Secondary | ICD-10-CM | POA: Diagnosis not present

## 2021-02-24 DIAGNOSIS — S82122A Displaced fracture of lateral condyle of left tibia, initial encounter for closed fracture: Secondary | ICD-10-CM

## 2021-02-24 DIAGNOSIS — Z96641 Presence of right artificial hip joint: Secondary | ICD-10-CM | POA: Insufficient documentation

## 2021-02-24 DIAGNOSIS — Z79899 Other long term (current) drug therapy: Secondary | ICD-10-CM | POA: Insufficient documentation

## 2021-02-24 DIAGNOSIS — M1612 Unilateral primary osteoarthritis, left hip: Secondary | ICD-10-CM | POA: Diagnosis not present

## 2021-02-24 DIAGNOSIS — Z96642 Presence of left artificial hip joint: Secondary | ICD-10-CM | POA: Diagnosis not present

## 2021-02-24 DIAGNOSIS — G4700 Insomnia, unspecified: Secondary | ICD-10-CM | POA: Diagnosis not present

## 2021-02-24 DIAGNOSIS — Z96649 Presence of unspecified artificial hip joint: Secondary | ICD-10-CM

## 2021-02-24 DIAGNOSIS — I10 Essential (primary) hypertension: Secondary | ICD-10-CM | POA: Diagnosis not present

## 2021-02-24 DIAGNOSIS — Z7982 Long term (current) use of aspirin: Secondary | ICD-10-CM | POA: Insufficient documentation

## 2021-02-24 DIAGNOSIS — Z8673 Personal history of transient ischemic attack (TIA), and cerebral infarction without residual deficits: Secondary | ICD-10-CM | POA: Insufficient documentation

## 2021-02-24 DIAGNOSIS — Z85828 Personal history of other malignant neoplasm of skin: Secondary | ICD-10-CM | POA: Diagnosis not present

## 2021-02-24 DIAGNOSIS — N179 Acute kidney failure, unspecified: Secondary | ICD-10-CM | POA: Diagnosis not present

## 2021-02-24 DIAGNOSIS — Z471 Aftercare following joint replacement surgery: Secondary | ICD-10-CM | POA: Diagnosis not present

## 2021-02-24 HISTORY — PX: TOTAL HIP ARTHROPLASTY: SHX124

## 2021-02-24 LAB — SARS CORONAVIRUS 2 (TAT 6-24 HRS): SARS Coronavirus 2: NEGATIVE

## 2021-02-24 SURGERY — ARTHROPLASTY, HIP, TOTAL, ANTERIOR APPROACH
Anesthesia: General | Site: Hip | Laterality: Left

## 2021-02-24 MED ORDER — LACTATED RINGERS IV SOLN: INTRAVENOUS | Status: DC

## 2021-02-24 MED ORDER — DIPHENHYDRAMINE HCL 12.5 MG/5ML PO ELIX: 12.5000 mg | ORAL_SOLUTION | ORAL | Status: DC | PRN

## 2021-02-24 MED ORDER — PROPOFOL 10 MG/ML IV BOLUS
INTRAVENOUS | Status: AC
  Filled 2021-02-24: qty 20

## 2021-02-24 MED ORDER — AMLODIPINE BESYLATE 5 MG PO TABS
5.0000 mg | ORAL_TABLET | Freq: Every day | ORAL | Status: DC
  Filled 2021-02-24: qty 1

## 2021-02-24 MED ORDER — ROCURONIUM BROMIDE 10 MG/ML (PF) SYRINGE
PREFILLED_SYRINGE | INTRAVENOUS | Status: AC
  Filled 2021-02-24: qty 10

## 2021-02-24 MED ORDER — SUGAMMADEX SODIUM 200 MG/2ML IV SOLN
INTRAVENOUS | Status: DC | PRN
  Administered 2021-02-24: 200 mg via INTRAVENOUS

## 2021-02-24 MED ORDER — METOCLOPRAMIDE HCL 5 MG PO TABS: 5.0000 mg | ORAL_TABLET | Freq: Three times a day (TID) | ORAL | Status: DC | PRN

## 2021-02-24 MED ORDER — FENTANYL CITRATE (PF) 100 MCG/2ML IJ SOLN
INTRAMUSCULAR | Status: AC
  Filled 2021-02-24: qty 2

## 2021-02-24 MED ORDER — HYDROMORPHONE HCL 1 MG/ML IJ SOLN
0.2500 mg | INTRAMUSCULAR | Status: DC | PRN
  Administered 2021-02-24 (×4): 0.5 mg via INTRAVENOUS

## 2021-02-24 MED ORDER — IRBESARTAN 75 MG PO TABS
75.0000 mg | ORAL_TABLET | Freq: Every day | ORAL | Status: DC
  Administered 2021-02-25: 75 mg via ORAL
  Filled 2021-02-24: qty 1

## 2021-02-24 MED ORDER — CEFAZOLIN SODIUM-DEXTROSE 2-4 GM/100ML-% IV SOLN
2.0000 g | INTRAVENOUS | Status: AC
  Administered 2021-02-24: 2 g via INTRAVENOUS
  Filled 2021-02-24: qty 100

## 2021-02-24 MED ORDER — ACETAMINOPHEN 325 MG PO TABS: 325.0000 mg | ORAL_TABLET | Freq: Once | ORAL | Status: DC | PRN

## 2021-02-24 MED ORDER — HYDROCODONE-ACETAMINOPHEN 7.5-325 MG PO TABS
1.0000 | ORAL_TABLET | ORAL | Status: DC | PRN
  Administered 2021-02-24: 1 via ORAL
  Filled 2021-02-24: qty 1

## 2021-02-24 MED ORDER — METOCLOPRAMIDE HCL 5 MG/ML IJ SOLN: 5.0000 mg | Freq: Three times a day (TID) | INTRAMUSCULAR | Status: DC | PRN

## 2021-02-24 MED ORDER — OXYCODONE HCL 5 MG PO TABS
ORAL_TABLET | ORAL | Status: AC
  Administered 2021-02-24: 5 mg
  Filled 2021-02-24: qty 1

## 2021-02-24 MED ORDER — PROPOFOL 500 MG/50ML IV EMUL
INTRAVENOUS | Status: AC
  Filled 2021-02-24: qty 50

## 2021-02-24 MED ORDER — TRANEXAMIC ACID-NACL 1000-0.7 MG/100ML-% IV SOLN
1000.0000 mg | INTRAVENOUS | Status: AC
  Administered 2021-02-24: 1000 mg via INTRAVENOUS

## 2021-02-24 MED ORDER — ONDANSETRON HCL 4 MG/2ML IJ SOLN: 4.0000 mg | Freq: Four times a day (QID) | INTRAMUSCULAR | Status: DC | PRN

## 2021-02-24 MED ORDER — LIDOCAINE HCL (PF) 2 % IJ SOLN
INTRAMUSCULAR | Status: AC
  Filled 2021-02-24: qty 5

## 2021-02-24 MED ORDER — ACETAMINOPHEN 325 MG PO TABS: 325.0000 mg | ORAL_TABLET | Freq: Four times a day (QID) | ORAL | Status: DC | PRN

## 2021-02-24 MED ORDER — CHLORHEXIDINE GLUCONATE 0.12 % MT SOLN
15.0000 mL | Freq: Once | OROMUCOSAL | Status: AC
  Administered 2021-02-24: 15 mL via OROMUCOSAL

## 2021-02-24 MED ORDER — DEXAMETHASONE SODIUM PHOSPHATE 10 MG/ML IJ SOLN
8.0000 mg | Freq: Once | INTRAMUSCULAR | Status: AC
  Administered 2021-02-24: 8 mg via INTRAVENOUS

## 2021-02-24 MED ORDER — SODIUM CHLORIDE 0.9 % IV SOLN: INTRAVENOUS | Status: DC

## 2021-02-24 MED ORDER — AMISULPRIDE (ANTIEMETIC) 5 MG/2ML IV SOLN: 10.0000 mg | Freq: Once | INTRAVENOUS | Status: DC | PRN

## 2021-02-24 MED ORDER — FENTANYL CITRATE (PF) 250 MCG/5ML IJ SOLN
INTRAMUSCULAR | Status: DC | PRN
  Administered 2021-02-24 (×2): 50 ug via INTRAVENOUS

## 2021-02-24 MED ORDER — ONDANSETRON HCL 4 MG/2ML IJ SOLN
INTRAMUSCULAR | Status: DC | PRN
  Administered 2021-02-24: 4 mg via INTRAVENOUS

## 2021-02-24 MED ORDER — ACETAMINOPHEN 10 MG/ML IV SOLN: 1000.0000 mg | Freq: Once | INTRAVENOUS | Status: DC | PRN

## 2021-02-24 MED ORDER — ZOLPIDEM TARTRATE 5 MG PO TABS
5.0000 mg | ORAL_TABLET | Freq: Every day | ORAL | Status: DC
  Administered 2021-02-24: 5 mg via ORAL
  Filled 2021-02-24: qty 1

## 2021-02-24 MED ORDER — 0.9 % SODIUM CHLORIDE (POUR BTL) OPTIME
TOPICAL | Status: DC | PRN
  Administered 2021-02-24: 1000 mL

## 2021-02-24 MED ORDER — CEFAZOLIN SODIUM-DEXTROSE 2-4 GM/100ML-% IV SOLN
2.0000 g | Freq: Four times a day (QID) | INTRAVENOUS | Status: AC
  Administered 2021-02-24 (×2): 2 g via INTRAVENOUS
  Filled 2021-02-24 (×2): qty 100

## 2021-02-24 MED ORDER — MEPERIDINE HCL 50 MG/ML IJ SOLN: 6.2500 mg | INTRAMUSCULAR | Status: DC | PRN

## 2021-02-24 MED ORDER — MORPHINE SULFATE (PF) 2 MG/ML IV SOLN: 0.5000 mg | INTRAVENOUS | Status: DC | PRN

## 2021-02-24 MED ORDER — TRANEXAMIC ACID-NACL 1000-0.7 MG/100ML-% IV SOLN
INTRAVENOUS | Status: AC
  Filled 2021-02-24: qty 100

## 2021-02-24 MED ORDER — DOCUSATE SODIUM 100 MG PO CAPS
100.0000 mg | ORAL_CAPSULE | Freq: Two times a day (BID) | ORAL | Status: DC
  Administered 2021-02-24 – 2021-02-25 (×2): 100 mg via ORAL
  Filled 2021-02-24 (×2): qty 1

## 2021-02-24 MED ORDER — POLYETHYLENE GLYCOL 3350 17 G PO PACK: 17.0000 g | PACK | Freq: Every day | ORAL | Status: DC | PRN

## 2021-02-24 MED ORDER — ONDANSETRON HCL 4 MG PO TABS: 4.0000 mg | ORAL_TABLET | Freq: Four times a day (QID) | ORAL | Status: DC | PRN

## 2021-02-24 MED ORDER — DEXAMETHASONE SODIUM PHOSPHATE 10 MG/ML IJ SOLN
INTRAMUSCULAR | Status: AC
  Filled 2021-02-24: qty 1

## 2021-02-24 MED ORDER — ROCURONIUM BROMIDE 10 MG/ML (PF) SYRINGE
PREFILLED_SYRINGE | INTRAVENOUS | Status: DC | PRN
  Administered 2021-02-24: 80 mg via INTRAVENOUS

## 2021-02-24 MED ORDER — STERILE WATER FOR IRRIGATION IR SOLN
Status: DC | PRN
  Administered 2021-02-24: 2000 mL

## 2021-02-24 MED ORDER — PHENYLEPHRINE 40 MCG/ML (10ML) SYRINGE FOR IV PUSH (FOR BLOOD PRESSURE SUPPORT)
PREFILLED_SYRINGE | INTRAVENOUS | Status: DC | PRN
  Administered 2021-02-24 (×2): 80 ug via INTRAVENOUS

## 2021-02-24 MED ORDER — ALBUTEROL SULFATE (2.5 MG/3ML) 0.083% IN NEBU: 2.5000 mg | INHALATION_SOLUTION | Freq: Four times a day (QID) | RESPIRATORY_TRACT | Status: DC | PRN

## 2021-02-24 MED ORDER — PHENOL 1.4 % MT LIQD: 1.0000 | OROMUCOSAL | Status: DC | PRN

## 2021-02-24 MED ORDER — HYDROMORPHONE HCL 1 MG/ML IJ SOLN
INTRAMUSCULAR | Status: AC
  Filled 2021-02-24: qty 2

## 2021-02-24 MED ORDER — DEXAMETHASONE SODIUM PHOSPHATE 10 MG/ML IJ SOLN
10.0000 mg | Freq: Once | INTRAMUSCULAR | Status: AC
  Administered 2021-02-25: 10 mg via INTRAVENOUS
  Filled 2021-02-24: qty 1

## 2021-02-24 MED ORDER — RIVAROXABAN 10 MG PO TABS
10.0000 mg | ORAL_TABLET | Freq: Every day | ORAL | Status: DC
  Administered 2021-02-25: 10 mg via ORAL
  Filled 2021-02-24: qty 1

## 2021-02-24 MED ORDER — HYDROCODONE-ACETAMINOPHEN 5-325 MG PO TABS
1.0000 | ORAL_TABLET | ORAL | Status: DC | PRN
  Administered 2021-02-24 – 2021-02-25 (×3): 1 via ORAL
  Filled 2021-02-24: qty 2
  Filled 2021-02-24 (×2): qty 1

## 2021-02-24 MED ORDER — ORAL CARE MOUTH RINSE: 15.0000 mL | Freq: Once | OROMUCOSAL | Status: AC

## 2021-02-24 MED ORDER — LIDOCAINE 2% (20 MG/ML) 5 ML SYRINGE
INTRAMUSCULAR | Status: DC | PRN
  Administered 2021-02-24: 80 mg via INTRAVENOUS

## 2021-02-24 MED ORDER — ONDANSETRON HCL 4 MG/2ML IJ SOLN
INTRAMUSCULAR | Status: AC
  Filled 2021-02-24: qty 2

## 2021-02-24 MED ORDER — FUROSEMIDE 40 MG PO TABS
40.0000 mg | ORAL_TABLET | Freq: Every day | ORAL | Status: DC
  Administered 2021-02-25: 40 mg via ORAL
  Filled 2021-02-24: qty 1

## 2021-02-24 MED ORDER — ACETAMINOPHEN 10 MG/ML IV SOLN
INTRAVENOUS | Status: AC
  Administered 2021-02-24: 1000 mg
  Filled 2021-02-24: qty 100

## 2021-02-24 MED ORDER — MECLIZINE HCL 25 MG PO TABS: 25.0000 mg | ORAL_TABLET | Freq: Three times a day (TID) | ORAL | Status: DC | PRN

## 2021-02-24 MED ORDER — METHOCARBAMOL 500 MG IVPB - SIMPLE MED
INTRAVENOUS | Status: AC
  Filled 2021-02-24: qty 50

## 2021-02-24 MED ORDER — POVIDONE-IODINE 10 % EX SWAB
2.0000 "application " | Freq: Once | CUTANEOUS | Status: AC
  Administered 2021-02-24: 2 via TOPICAL

## 2021-02-24 MED ORDER — FENTANYL CITRATE PF 50 MCG/ML IJ SOSY
PREFILLED_SYRINGE | INTRAMUSCULAR | Status: AC
  Filled 2021-02-24: qty 3

## 2021-02-24 MED ORDER — FERROUS SULFATE 325 (65 FE) MG PO TABS
325.0000 mg | ORAL_TABLET | Freq: Three times a day (TID) | ORAL | Status: DC
  Administered 2021-02-25 (×2): 325 mg via ORAL
  Filled 2021-02-24 (×2): qty 1

## 2021-02-24 MED ORDER — TRANEXAMIC ACID-NACL 1000-0.7 MG/100ML-% IV SOLN
1000.0000 mg | Freq: Once | INTRAVENOUS | Status: AC
  Administered 2021-02-24: 1000 mg via INTRAVENOUS
  Filled 2021-02-24: qty 100

## 2021-02-24 MED ORDER — PHENYLEPHRINE 40 MCG/ML (10ML) SYRINGE FOR IV PUSH (FOR BLOOD PRESSURE SUPPORT)
PREFILLED_SYRINGE | INTRAVENOUS | Status: AC
  Filled 2021-02-24: qty 10

## 2021-02-24 MED ORDER — PROPOFOL 10 MG/ML IV BOLUS
INTRAVENOUS | Status: DC | PRN
  Administered 2021-02-24: 160 mg via INTRAVENOUS

## 2021-02-24 MED ORDER — ACETAMINOPHEN 160 MG/5ML PO SOLN: 325.0000 mg | Freq: Once | ORAL | Status: DC | PRN

## 2021-02-24 MED ORDER — METHOCARBAMOL 500 MG IVPB - SIMPLE MED
500.0000 mg | Freq: Four times a day (QID) | INTRAVENOUS | Status: DC | PRN
  Administered 2021-02-24: 500 mg via INTRAVENOUS
  Filled 2021-02-24: qty 50

## 2021-02-24 MED ORDER — PANTOPRAZOLE SODIUM 40 MG PO TBEC
40.0000 mg | DELAYED_RELEASE_TABLET | Freq: Every day | ORAL | Status: DC
  Administered 2021-02-25: 40 mg via ORAL
  Filled 2021-02-24: qty 1

## 2021-02-24 MED ORDER — MENTHOL 3 MG MT LOZG: 1.0000 | LOZENGE | OROMUCOSAL | Status: DC | PRN

## 2021-02-24 MED ORDER — BISACODYL 10 MG RE SUPP: 10.0000 mg | Freq: Every day | RECTAL | Status: DC | PRN

## 2021-02-24 MED ORDER — PROMETHAZINE HCL 25 MG/ML IJ SOLN: 6.2500 mg | INTRAMUSCULAR | Status: DC | PRN

## 2021-02-24 MED ORDER — METHOCARBAMOL 500 MG PO TABS
500.0000 mg | ORAL_TABLET | Freq: Four times a day (QID) | ORAL | Status: DC | PRN
  Administered 2021-02-24 – 2021-02-25 (×2): 500 mg via ORAL
  Filled 2021-02-24 (×2): qty 1

## 2021-02-24 MED ORDER — PROPOFOL 500 MG/50ML IV EMUL
INTRAVENOUS | Status: DC | PRN
  Administered 2021-02-24: 175 ug/kg/min via INTRAVENOUS

## 2021-02-24 MED ORDER — PROPOFOL 1000 MG/100ML IV EMUL
INTRAVENOUS | Status: AC
  Filled 2021-02-24: qty 100

## 2021-02-24 MED ORDER — UMECLIDINIUM-VILANTEROL 62.5-25 MCG/INH IN AEPB
1.0000 | INHALATION_SPRAY | Freq: Every day | RESPIRATORY_TRACT | Status: DC
  Administered 2021-02-25: 1 via RESPIRATORY_TRACT
  Filled 2021-02-24: qty 14

## 2021-02-24 SURGICAL SUPPLY — 42 items
ARTICULEZE HEAD (Hips) ×2 IMPLANT
BAG COUNTER SPONGE SURGICOUNT (BAG) IMPLANT
BAG DECANTER FOR FLEXI CONT (MISCELLANEOUS) IMPLANT
BAG ZIPLOCK 12X15 (MISCELLANEOUS) IMPLANT
BLADE SAG 18X100X1.27 (BLADE) ×2 IMPLANT
COVER PERINEAL POST (MISCELLANEOUS) ×2 IMPLANT
COVER SURGICAL LIGHT HANDLE (MISCELLANEOUS) ×2 IMPLANT
CUP ACET PINNACLE SECTR 56MM (Hips) ×1 IMPLANT
DERMABOND ADVANCED (GAUZE/BANDAGES/DRESSINGS) ×1
DERMABOND ADVANCED .7 DNX12 (GAUZE/BANDAGES/DRESSINGS) ×1 IMPLANT
DRAPE FOOT SWITCH (DRAPES) ×2 IMPLANT
DRAPE STERI IOBAN 125X83 (DRAPES) ×2 IMPLANT
DRAPE U-SHAPE 47X51 STRL (DRAPES) ×4 IMPLANT
DRESSING AQUACEL AG SP 3.5X10 (GAUZE/BANDAGES/DRESSINGS) ×1 IMPLANT
DRSG AQUACEL AG ADV 3.5X10 (GAUZE/BANDAGES/DRESSINGS) ×2 IMPLANT
DRSG AQUACEL AG SP 3.5X10 (GAUZE/BANDAGES/DRESSINGS) ×2
DURAPREP 26ML APPLICATOR (WOUND CARE) ×2 IMPLANT
ELECT REM PT RETURN 15FT ADLT (MISCELLANEOUS) ×2 IMPLANT
ELIMINATOR HOLE APEX DEPUY (Hips) ×2 IMPLANT
GLOVE SURG ENC MOIS LTX SZ6 (GLOVE) ×4 IMPLANT
GLOVE SURG ENC MOIS LTX SZ7 (GLOVE) ×2 IMPLANT
GLOVE SURG POLY ORTHO LF SZ6.5 (GLOVE) ×2 IMPLANT
GLOVE SURG UNDER POLY LF SZ7.5 (GLOVE) ×2 IMPLANT
GOWN STRL REUS W/TWL LRG LVL3 (GOWN DISPOSABLE) ×4 IMPLANT
HEAD ARTICULEZE (Hips) ×1 IMPLANT
HOLDER FOLEY CATH W/STRAP (MISCELLANEOUS) ×2 IMPLANT
KIT TURNOVER KIT A (KITS) ×2 IMPLANT
PACK ANTERIOR HIP CUSTOM (KITS) ×2 IMPLANT
PENCIL SMOKE EVACUATOR (MISCELLANEOUS) IMPLANT
PINNACLE ALTRX PLUS 4 N 36X56 (Hips) ×2 IMPLANT
PINNACLE SECTOR CUP 56MM (Hips) ×2 IMPLANT
SCREW 6.5MMX25MM (Screw) ×2 IMPLANT
STEM FEM ACTIS HIGH SZ10 (Stem) ×2 IMPLANT
SUT MNCRL AB 4-0 PS2 18 (SUTURE) ×2 IMPLANT
SUT STRATAFIX 0 PDS 27 VIOLET (SUTURE) ×2
SUT VIC AB 1 CT1 36 (SUTURE) ×6 IMPLANT
SUT VIC AB 2-0 CT1 27 (SUTURE) ×4
SUT VIC AB 2-0 CT1 TAPERPNT 27 (SUTURE) ×2 IMPLANT
SUTURE STRATFX 0 PDS 27 VIOLET (SUTURE) ×1 IMPLANT
TRAY FOLEY MTR SLVR 16FR STAT (SET/KITS/TRAYS/PACK) IMPLANT
TUBE SUCTION HIGH CAP CLEAR NV (SUCTIONS) ×2 IMPLANT
WATER STERILE IRR 1000ML POUR (IV SOLUTION) ×2 IMPLANT

## 2021-02-24 NOTE — Care Plan (Signed)
Ortho Bundle Case Management Note  Patient Details  Name: JEAN SKOW MRN: [id number] Date of Birth: 02/06/1936  L THA on 02-24-21 DCP:  Home with granddaughter.  1 story home with 2 ste. DME:  No needs.  Has a RW and handicap toilet with grab bars PT:  HEP                   DME Arranged:  N/A DME Agency:  NA  HH Arranged:  NA HH Agency:  NA  Additional Comments: Please contact me with any questions of if this plan should need to change.  Marianne Sofia, RN,CCM [facility]  [phone] 02/24/2021, 4:56 PM

## 2021-02-24 NOTE — Plan of Care (Signed)

## 2021-02-24 NOTE — Transfer of Care (Signed)
Immediate Anesthesia Transfer of Care Note  Patient: Brendan Hopkins  Procedure(s) Performed: TOTAL HIP ARTHROPLASTY ANTERIOR APPROACH (Left: Hip)  Patient Location: PACU  Anesthesia Type:General  Level of Consciousness: awake, alert  and oriented  Airway & Oxygen Therapy: Patient Spontanous Breathing and Patient connected to face mask oxygen  Post-op Assessment: Report given to RN and Post -op Vital signs reviewed and stable  Post vital signs: Reviewed and stable  Last Vitals:  Vitals Value Taken Time  BP    Temp    Pulse 63 02/24/21 1038  Resp 18 02/24/21 1038  SpO2 99 % 02/24/21 1038  Vitals shown include unvalidated device data.  Last Pain:  Vitals:   02/24/21 0646  TempSrc:   PainSc: 5       Patients Stated Pain Goal: 4 (96/78/93 8101)  Complications: No notable events documented.

## 2021-02-24 NOTE — Evaluation (Signed)
Physical Therapy Evaluation Patient Details Name: Brendan Hopkins MRN: [id number] DOB: 1936/03/20 Today's Date: 02/24/2021  History of Present Illness  Patient is 85 y.o. male s/p Lt THA anterior approach on 02/24/21 with PMH significant for OA, COPD, GERD, HOH, HTN, RBBB, TIA, Rt THA in 2014.    Clinical Impression  Brendan Hopkins is a 85 y.o. male POD 0 s/p Lt THA. Patient reports independence with mobility at baseline. Patient is now limited by functional impairments (see PT problem list below) and requires min assist for transfers and gait with RW. Patient was able to ambulate 40 feet with RW and min assist. Patient instructed in exercise to facilitate circulation to manage edema and reduce risk of DVT. Patient will benefit from continued skilled PT interventions to address impairments and progress towards PLOF. Acute PT will follow to progress mobility and stair training in preparation for safe discharge home.        Recommendations for follow up therapy are one component of a multi-disciplinary discharge planning process, led by the attending physician.  Recommendations may be updated based on patient status, additional functional criteria and insurance authorization.  Follow Up Recommendations Home health PT    Equipment Recommendations  None recommended by PT    Recommendations for Other Services       Precautions / Restrictions Precautions Precautions: Fall Restrictions Weight Bearing Restrictions: No LLE Weight Bearing: Weight bearing as tolerated Other Position/Activity Restrictions: WBAT      Mobility  Bed Mobility               General bed mobility comments: pt sitting EOB at start of session.    Transfers Overall transfer level: Needs assistance Equipment used: Rolling walker (2 wheeled) Transfers: Sit to/from Stand Sit to Stand: Min assist         General transfer comment: cues for hand placement for power up. Assist to rise from EOB and  steady. cues for safe reach back to recliner to control lowering.  Ambulation/Gait Ambulation/Gait assistance: Min assist Gait Distance (Feet): 40 Feet Assistive device: Rolling walker (2 wheeled) Gait Pattern/deviations: Step-to pattern;Decreased stride length;Decreased step length - left;Decreased weight shift to left Gait velocity: decr   General Gait Details: cues for step to pattern and proximity to RW, pt leading with Rt LE due to pain advancing Lt LE. min assist for walker as pt tended to move walker too far anteriorly.  Stairs            Wheelchair Mobility    Modified Rankin (Stroke Patients Only)       Balance Overall balance assessment: Needs assistance Sitting-balance support: Feet supported Sitting balance-Leahy Scale: Good     Standing balance support: During functional activity;Bilateral upper extremity supported Standing balance-Leahy Scale: Poor                               Pertinent Vitals/Pain Pain Assessment: 0-10 Pain Score: 7  Pain Location: Lt hip Pain Descriptors / Indicators: Aching;Discomfort;Grimacing;Moaning Pain Intervention(s): Limited activity within patient's tolerance;Monitored during session;Repositioned    Home Living Family/patient expects to be discharged to:: Private residence Living Arrangements: Other relatives Available Help at Discharge: Family Type of Home: House Home Access: Stairs to enter Entrance Stairs-Rails: Can reach both Entrance Stairs-Number of Steps: 2 Home Layout: One level Home Equipment: Environmental consultant - 2 wheels;Walker - 4 wheels;Cane - single point;Shower seat;Grab bars - tub/shower;Hand held shower head;Grab bars - toilet;Transport chair Additional  Comments: pt's granddaughter is going to stay with him while he recovers.    Prior Function Level of Independence: Independent               Hand Dominance   Dominant Hand: Right    Extremity/Trunk Assessment   Upper Extremity  Assessment Upper Extremity Assessment: Overall WFL for tasks assessed    Lower Extremity Assessment Lower Extremity Assessment: Overall WFL for tasks assessed;LLE deficits/detail LLE: Unable to fully assess due to pain LLE Sensation: WNL LLE Coordination: WNL    Cervical / Trunk Assessment Cervical / Trunk Assessment: Normal  Communication   Communication: No difficulties  Cognition Arousal/Alertness: Awake/alert Behavior During Therapy: WFL for tasks assessed/performed Overall Cognitive Status: Within Functional Limits for tasks assessed                                        General Comments      Exercises Total Joint Exercises Ankle Circles/Pumps: AROM;Both;20 reps;Seated   Assessment/Plan    PT Assessment Patient needs continued PT services  PT Problem List Decreased strength;Decreased range of motion;Decreased activity tolerance;Decreased balance;Decreased mobility;Decreased knowledge of use of DME;Decreased knowledge of precautions;Pain       PT Treatment Interventions DME instruction;Gait training;Stair training;Functional mobility training;Therapeutic activities;Therapeutic exercise;Balance training;Patient/family education    PT Goals (Current goals can be found in the Care Plan section)  Acute Rehab PT Goals Patient Stated Goal: get recovered to take pretty women to dinner PT Goal Formulation: With patient Time For Goal Achievement: 03/03/21 Potential to Achieve Goals: Good    Frequency 7X/week   Barriers to discharge        Co-evaluation               AM-PAC PT "6 Clicks" Mobility  Outcome Measure Help needed turning from your back to your side while in a flat bed without using bedrails?: A Little Help needed moving from lying on your back to sitting on the side of a flat bed without using bedrails?: A Little Help needed moving to and from a bed to a chair (including a wheelchair)?: A Little Help needed standing up from a chair  using your arms (e.g., wheelchair or bedside chair)?: A Little Help needed to walk in hospital room?: A Little Help needed climbing 3-5 steps with a railing? : A Lot 6 Click Score: 17    End of Session Equipment Utilized During Treatment: Gait belt Activity Tolerance: Patient tolerated treatment well Patient left: in chair;with call bell/phone within reach;with chair alarm set;with family/visitor present Nurse Communication: Mobility status;Patient requests pain meds PT Visit Diagnosis: Muscle weakness (generalized) (M62.81);Difficulty in walking, not elsewhere classified (R26.2)    Time: 9518-8416 PT Time Calculation (min) (ACUTE ONLY): 22 min   Charges:   PT Evaluation $PT Eval Low Complexity: 1 Low          Verner Mould, DPT Acute Rehabilitation Services Office [phone] Pager [phone]   Brendan Hopkins 02/24/2021, 6:02 PM

## 2021-02-24 NOTE — Discharge Instructions (Addendum)
INSTRUCTIONS AFTER JOINT REPLACEMENT   Remove items at home which could result in a fall. This includes throw rugs or furniture in walking pathways ICE to the affected joint every three hours while awake for 30 minutes at a time, for at least the first 3-5 days, and then as needed for pain and swelling.  Continue to use ice for pain and swelling. You may notice swelling that will progress down to the foot and ankle.  This is normal after surgery.  Elevate your leg when you are not up walking on it.   Continue to use the breathing machine you got in the hospital (incentive spirometer) which will help keep your temperature down.  It is common for your temperature to cycle up and down following surgery, especially at night when you are not up moving around and exerting yourself.  The breathing machine keeps your lungs expanded and your temperature down.   DIET:  As you were doing prior to hospitalization, we recommend a well-balanced diet.  DRESSING / WOUND CARE / SHOWERING  Keep the surgical dressing until follow up.  The dressing is water proof, so you can shower without any extra covering.  IF THE DRESSING FALLS OFF or the wound gets wet inside, change the dressing with sterile gauze.  Please use good hand washing techniques before changing the dressing.  Do not use any lotions or creams on the incision until instructed by your surgeon.    ACTIVITY  Increase activity slowly as tolerated, but follow the weight bearing instructions below.   No driving for 6 weeks or until further direction given by your physician.  You cannot drive while taking narcotics.  No lifting or carrying greater than 10 lbs. until further directed by your surgeon. Avoid periods of inactivity such as sitting longer than an hour when not asleep. This helps prevent blood clots.  You may return to work once you are authorized by your doctor.     WEIGHT BEARING   Weight bearing as tolerated with assist device (walker, cane,  etc) as directed, use it as long as suggested by your surgeon or therapist, typically at least 4-6 weeks.   EXERCISES  Results after joint replacement surgery are often greatly improved when you follow the exercise, range of motion and muscle strengthening exercises prescribed by your doctor. Safety measures are also important to protect the joint from further injury. Any time any of these exercises cause you to have increased pain or swelling, decrease what you are doing until you are comfortable again and then slowly increase them. If you have problems or questions, call your caregiver or physical therapist for advice.   Rehabilitation is important following a joint replacement. After just a few days of immobilization, the muscles of the leg can become weakened and shrink (atrophy).  These exercises are designed to build up the tone and strength of the thigh and leg muscles and to improve motion. Often times heat used for twenty to thirty minutes before working out will loosen up your tissues and help with improving the range of motion but do not use heat for the first two weeks following surgery (sometimes heat can increase post-operative swelling).   These exercises can be done on a training (exercise) mat, on the floor, on a table or on a bed. Use whatever works the best and is most comfortable for you.    Use music or television while you are exercising so that the exercises are a pleasant break in your   day. This will make your life better with the exercises acting as a break in your routine that you can look forward to.   Perform all exercises about fifteen times, three times per day or as directed.  You should exercise both the operative leg and the other leg as well.  Exercises include:   Quad Sets - Tighten up the muscle on the front of the thigh (Quad) and hold for 5-10 seconds.   Straight Leg Raises - With your knee straight (if you were given a brace, keep it on), lift the leg to 60  degrees, hold for 3 seconds, and slowly lower the leg.  Perform this exercise against resistance later as your leg gets stronger.  Leg Slides: Lying on your back, slowly slide your foot toward your buttocks, bending your knee up off the floor (only go as far as is comfortable). Then slowly slide your foot back down until your leg is flat on the floor again.  Angel Wings: Lying on your back spread your legs to the side as far apart as you can without causing discomfort.  Hamstring Strength:  Lying on your back, push your heel against the floor with your leg straight by tightening up the muscles of your buttocks.  Repeat, but this time bend your knee to a comfortable angle, and push your heel against the floor.  You may put a pillow under the heel to make it more comfortable if necessary.   A rehabilitation program following joint replacement surgery can speed recovery and prevent re-injury in the future due to weakened muscles. Contact your doctor or a physical therapist for more information on knee rehabilitation.    CONSTIPATION  Constipation is defined medically as fewer than three stools per week and severe constipation as less than one stool per week.  Even if you have a regular bowel pattern at home, your normal regimen is likely to be disrupted due to multiple reasons following surgery.  Combination of anesthesia, postoperative narcotics, change in appetite and fluid intake all can affect your bowels.   YOU MUST use at least one of the following options; they are listed in order of increasing strength to get the job done.  They are all available over the counter, and you may need to use some, POSSIBLY even all of these options:    Drink plenty of fluids (prune juice may be helpful) and high fiber foods Colace 100 mg by mouth twice a day  Senokot for constipation as directed and as needed Dulcolax (bisacodyl), take with full glass of water  Miralax (polyethylene glycol) once or twice a day as  needed.  If you have tried all these things and are unable to have a bowel movement in the first 3-4 days after surgery call either your surgeon or your primary doctor.    If you experience loose stools or diarrhea, hold the medications until you stool forms back up.  If your symptoms do not get better within 1 week or if they get worse, check with your doctor.  If you experience "the worst abdominal pain ever" or develop nausea or vomiting, please contact the office immediately for further recommendations for treatment.   ITCHING:  If you experience itching with your medications, try taking only a single pain pill, or even half a pain pill at a time.  You can also use Benadryl over the counter for itching or also to help with sleep.   TED HOSE STOCKINGS:  Use stockings on both   legs until for at least 2 weeks or as directed by physician office. They may be removed at night for sleeping.  MEDICATIONS:  See your medication summary on the "After Visit Summary" that nursing will review with you.  You may have some home medications which will be placed on hold until you complete the course of blood thinner medication.  It is important for you to complete the blood thinner medication as prescribed.  PRECAUTIONS:  If you experience chest pain or shortness of breath - call 911 immediately for transfer to the hospital emergency department.   If you develop a fever greater that 101 F, purulent drainage from wound, increased redness or drainage from wound, foul odor from the wound/dressing, or calf pain - CONTACT YOUR SURGEON.                                                   FOLLOW-UP APPOINTMENTS:  If you do not already have a post-op appointment, please call the office for an appointment to be seen by your surgeon.  Guidelines for how soon to be seen are listed in your "After Visit Summary", but are typically between 1-4 weeks after surgery.  OTHER INSTRUCTIONS:   Knee Replacement:  Do not place pillow  under knee, focus on keeping the knee straight while resting. CPM instructions: 0-90 degrees, 2 hours in the morning, 2 hours in the afternoon, and 2 hours in the evening. Place foam block, curve side up under heel at all times except when in CPM or when walking.  DO NOT modify, tear, cut, or change the foam block in any way.  POST-OPERATIVE OPIOID TAPER INSTRUCTIONS: It is important to wean off of your opioid medication as soon as possible. If you do not need pain medication after your surgery it is ok to stop day one. Opioids include: Codeine, Hydrocodone(Norco, Vicodin), Oxycodone(Percocet, oxycontin) and hydromorphone amongst others.  Long term and even short term use of opiods can cause: Increased pain response Dependence Constipation Depression Respiratory depression And more.  Withdrawal symptoms can include Flu like symptoms Nausea, vomiting And more Techniques to manage these symptoms Hydrate well Eat regular healthy meals Stay active Use relaxation techniques(deep breathing, meditating, yoga) Do Not substitute Alcohol to help with tapering If you have been on opioids for less than two weeks and do not have pain than it is ok to stop all together.  Plan to wean off of opioids This plan should start within one week post op of your joint replacement. Maintain the same interval or time between taking each dose and first decrease the dose.  Cut the total daily intake of opioids by one tablet each day Next start to increase the time between doses. The last dose that should be eliminated is the evening dose.   MAKE SURE YOU:  Understand these instructions.  Get help right away if you are not doing well or get worse.    Thank you for letting us be a part of your medical care team.  It is a privilege we respect greatly.  We hope these instructions will help you stay on track for a fast and full recovery!   Information on my medicine - XARELTO (Rivaroxaban)  This medication  education was reviewed with me or my healthcare representative as part of my discharge preparation.  The pharmacist that spoke   with me during my hospital stay was:    Why was Xarelto prescribed for you? Xarelto was prescribed for you to reduce the risk of blood clots forming after orthopedic surgery. The medical term for these abnormal blood clots is venous thromboembolism (VTE).  What do you need to know about xarelto ? Take your Xarelto ONCE DAILY at the same time every day. You may take it either with or without food.  If you have difficulty swallowing the tablet whole, you may crush it and mix in applesauce just prior to taking your dose.  Take Xarelto exactly as prescribed by your doctor and DO NOT stop taking Xarelto without talking to the doctor who prescribed the medication.  Stopping without other VTE prevention medication to take the place of Xarelto may increase your risk of developing a clot.  After discharge, you should have regular check-up appointments with your healthcare provider that is prescribing your Xarelto.    What do you do if you miss a dose? If you miss a dose, take it as soon as you remember on the same day then continue your regularly scheduled once daily regimen the next day. Do not take two doses of Xarelto on the same day.   Important Safety Information A possible side effect of Xarelto is bleeding. You should call your healthcare provider right away if you experience any of the following: Bleeding from an injury or your nose that does not stop. Unusual colored urine (red or dark brown) or unusual colored stools (red or black). Unusual bruising for unknown reasons. A serious fall or if you hit your head (even if there is no bleeding).  Some medicines may interact with Xarelto and might increase your risk of bleeding while on Xarelto. To help avoid this, consult your healthcare provider or pharmacist prior to using any new prescription or  non-prescription medications, including herbals, vitamins, non-steroidal anti-inflammatory drugs (NSAIDs) and supplements.  This website has more information on Xarelto: www.xarelto.com.     

## 2021-02-24 NOTE — Op Note (Signed)
NAME:  Brendan Hopkins                ACCOUNT NO.: [id number]      MEDICAL RECORD NO.: [id number]      FACILITY:  [facility]      PHYSICIAN:  [name]  DATE OF BIRTH:  Jun 09, 1935     DATE OF PROCEDURE:  02/24/2021                                 OPERATIVE REPORT         PREOPERATIVE DIAGNOSIS: Left  hip osteoarthritis.      POSTOPERATIVE DIAGNOSIS:  Left hip osteoarthritis.      PROCEDURE:  Left total hip replacement through an anterior approach   utilizing DePuy THR system, component size 56 mm pinnacle cup, a size 36+4 neutral   Altrex liner, a size 10 Hi Actis stem with a 36+5 Articuleze metal head ball.      SURGEON:  Pietro Cassis. Alvan Dame, M.D.      ASSISTANT:  Costella Hatcher, PA-C     ANESTHESIA:  General.      SPECIMENS:  None.      COMPLICATIONS:  None.      BLOOD LOSS:  300 cc     DRAINS:  None.      INDICATION OF THE PROCEDURE:  Brendan Hopkins is a 85 y.o. male who had   presented to office for evaluation of left hip pain.  Radiographs revealed   progressive degenerative changes with bone-on-bone   articulation of the  hip joint, including subchondral cystic changes and osteophytes.  The patient had painful limited range of   motion significantly affecting their overall quality of life and function.  The patient was failing to    respond to conservative measures including medications and/or injections and activity modification and at this point was ready   to proceed with more definitive measures.  Consent was obtained for   benefit of pain relief.  Specific risks of infection, DVT, component   failure, dislocation, neurovascular injury, and need for revision surgery were reviewed in the office as well discussion of   the anterior versus posterior approach were reviewed.     PROCEDURE IN DETAIL:  The patient was brought to operative theater.   Once adequate anesthesia, preoperative antibiotics, 2 gm of Ancef, 1 gm of Tranexamic Acid,  and 10 mg of Decadron were administered, the patient was positioned supine on the [employer] table.  Once the patient was safely positioned with adequate padding of boney prominences we predraped out the hip, and used fluoroscopy to confirm orientation of the pelvis.      The left hip was then prepped and draped from proximal iliac crest to   mid thigh with a shower curtain technique.      Time-out was performed identifying the patient, planned procedure, and the appropriate extremity.     An incision was then made 2 cm lateral to the   anterior superior iliac spine extending over the orientation of the   tensor fascia lata muscle and sharp dissection was carried down to the   fascia of the muscle.      The fascia was then incised.  The muscle belly was identified and swept   laterally and retractor placed along the superior neck.  Following   cauterization of the circumflex vessels and removing some pericapsular  fat, a second cobra retractor was placed on the inferior neck.  A T-capsulotomy was made along the line of the   superior neck to the trochanteric fossa, then extended proximally and   distally.  Tag sutures were placed and the retractors were then placed   intracapsular.  We then identified the trochanteric fossa and   orientation of my neck cut and then made a neck osteotomy with the femur on traction.  The femoral   head was removed without difficulty or complication.  Traction was let   off and retractors were placed posterior and anterior around the   acetabulum.      The labrum and foveal tissue were debrided.  I began reaming with a 47 mm   reamer and reamed up to 55 mm reamer with good bony bed preparation and a 56 mm  cup was chosen.  The final 56 mm Pinnacle cup was then impacted under fluoroscopy to confirm the depth of penetration and orientation with respect to   Abduction and forward flexion.  A screw was placed into the ilium followed by the hole eliminator.  The  final   36+4 neutral Altrex liner was impacted with good visualized rim fit.  The cup was positioned anatomically within the acetabular portion of the pelvis.      At this point, the femur was rolled to 100 degrees.  Further capsule was   released off the inferior aspect of the femoral neck.  I then   released the superior capsule proximally.  With the leg in a neutral position the hook was placed laterally   along the femur under the vastus lateralis origin and elevated manually and then held in position using the hook attachment on the bed.  The leg was then extended and adducted with the leg rolled to 100   degrees of external rotation.  Retractors were placed along the medial calcar and posteriorly over the greater trochanter.  Once the proximal femur was fully   exposed, I used a box osteotome to set orientation.  I then began   broaching with the starting chili pepper broach and passed this by hand and then broached up to 10.  With the 10 broach in place I chose a high offset neck and did several trial reductions.  The offset was appropriate, leg lengths   appeared to be equal best matched with the +5 head ball trial confirmed radiographically.   Given these findings, I went ahead and dislocated the hip, repositioned all   retractors and positioned the right hip in the extended and abducted position.  The final 10 Hi Actis stem was   chosen and it was impacted down to the level of neck cut.  Based on this   and the trial reductions, a final 36+5 Articuleze metal ball was chosen and   impacted onto a clean and dry trunnion, and the hip was reduced.  The   hip had been irrigated throughout the case again at this point.  I did   reapproximate the superior capsular leaflet to the anterior leaflet   using #1 Vicryl.  The fascia of the   tensor fascia lata muscle was then reapproximated using #1 Vicryl and #0 Stratafix sutures.  The   remaining wound was closed with 2-0 Vicryl and running 4-0  Monocryl.   The hip was cleaned, dried, and dressed sterilely using Dermabond and   Aquacel dressing.  The patient was then brought   to recovery room in  stable condition tolerating the procedure well.    Costella Hatcher, PA-C was present for the entirety of the case involved from   preoperative positioning, perioperative retractor management, general   facilitation of the case, as well as primary wound closure as assistant.            Pietro Cassis Alvan Dame, M.D.        02/24/2021 8:59 AM

## 2021-02-24 NOTE — Anesthesia Preprocedure Evaluation (Signed)
Anesthesia Evaluation  Patient identified by MRN, date of birth, ID band Patient awake    Reviewed: Allergy & Precautions, NPO status , Patient's Chart, lab work & pertinent test results  Airway Mallampati: I  TM Distance: >3 FB Neck ROM: Full    Dental  (+) Edentulous Upper, Edentulous Lower   Pulmonary COPD, former smoker,    Pulmonary exam normal        Cardiovascular hypertension, Pt. on medications + dysrhythmias Atrial Fibrillation  Rhythm:Regular Rate:Normal     Neuro/Psych TIACVA, No Residual Symptoms negative psych ROS   GI/Hepatic Neg liver ROS, GERD  ,  Endo/Other    Renal/GU      Musculoskeletal   Abdominal Normal abdominal exam  (+)   Peds  Hematology   Anesthesia Other Findings   Reproductive/Obstetrics                             Anesthesia Physical Anesthesia Plan  ASA: 3  Anesthesia Plan: General   Post-op Pain Management:    Induction: Intravenous  PONV Risk Score and Plan: 3 and Ondansetron, Dexamethasone and Treatment may vary due to age or medical condition  Airway Management Planned: LMA and Oral ETT  Additional Equipment: None  Intra-op Plan:   Post-operative Plan: Extubation in OR  Informed Consent: I have reviewed the patients History and Physical, chart, labs and discussed the procedure including the risks, benefits and alternatives for the proposed anesthesia with the patient or authorized representative who has indicated his/her understanding and acceptance.     Dental advisory given  Plan Discussed with: CRNA  Anesthesia Plan Comments: (Pt declined spinal anesthetic. )        Anesthesia Quick Evaluation

## 2021-02-24 NOTE — Anesthesia Postprocedure Evaluation (Signed)
Anesthesia Post Note  Patient: Brendan Hopkins  Procedure(s) Performed: TOTAL HIP ARTHROPLASTY ANTERIOR APPROACH (Left: Hip)     Patient location during evaluation: PACU Anesthesia Type: General Level of consciousness: oriented and awake and alert Pain management: pain level controlled Vital Signs Assessment: post-procedure vital signs reviewed and stable Respiratory status: spontaneous breathing, respiratory function stable and patient connected to nasal cannula oxygen Cardiovascular status: blood pressure returned to baseline and stable Postop Assessment: no headache, no backache and no apparent nausea or vomiting Anesthetic complications: no   No notable events documented.  Last Vitals:  Vitals:   02/24/21 1345 02/24/21 1400  BP: (!) 148/79 122/75  Pulse: 64 77  Resp: 20 16  Temp:    SpO2: 98% 92%    Last Pain:  Vitals:   02/24/21 1200  TempSrc:   PainSc: [place]

## 2021-02-24 NOTE — Interval H&P Note (Signed)
History and Physical Interval Note:  02/24/2021 7:27 AM  Brendan Hopkins  has presented today for surgery, with the diagnosis of left hip osteoarthritis.  The various methods of treatment have been discussed with the patient and family. After consideration of risks, benefits and other options for treatment, the patient has consented to  Procedure(s): TOTAL HIP ARTHROPLASTY ANTERIOR APPROACH (Left) as a surgical intervention.  The patient's history has been reviewed, patient examined, no change in status, stable for surgery.  I have reviewed the patient's chart and labs.  Questions were answered to the patient's satisfaction.     [name]

## 2021-02-24 NOTE — Anesthesia Procedure Notes (Signed)
Procedure Name: Intubation Date/Time: 02/24/2021 9:01 AM Performed by: Dayan Kreis D, CRNA Pre-anesthesia Checklist: Patient identified, Emergency Drugs available, Suction available and Patient being monitored Patient Re-evaluated:Patient Re-evaluated prior to induction Oxygen Delivery Method: Circle system utilized Preoxygenation: Pre-oxygenation with 100% oxygen Induction Type: IV induction Ventilation: Mask ventilation without difficulty Laryngoscope Size: Mac and 4 Grade View: Grade I Tube type: Oral Tube size: 7.5 mm Number of attempts: 1 Airway Equipment and Method: Stylet and Oral airway Placement Confirmation: ETT inserted through vocal cords under direct vision, positive ETCO2 and breath sounds checked- equal and bilateral Secured at: 22 cm Tube secured with: Tape Dental Injury: Teeth and Oropharynx as per pre-operative assessment

## 2021-02-25 ENCOUNTER — Encounter (HOSPITAL_COMMUNITY): Payer: Self-pay | Admitting: Orthopedic Surgery

## 2021-02-25 ENCOUNTER — Other Ambulatory Visit: Payer: Self-pay | Admitting: Family Medicine

## 2021-02-25 DIAGNOSIS — J449 Chronic obstructive pulmonary disease, unspecified: Secondary | ICD-10-CM | POA: Diagnosis not present

## 2021-02-25 DIAGNOSIS — Z8673 Personal history of transient ischemic attack (TIA), and cerebral infarction without residual deficits: Secondary | ICD-10-CM | POA: Diagnosis not present

## 2021-02-25 DIAGNOSIS — Z85828 Personal history of other malignant neoplasm of skin: Secondary | ICD-10-CM | POA: Diagnosis not present

## 2021-02-25 DIAGNOSIS — Z79899 Other long term (current) drug therapy: Secondary | ICD-10-CM | POA: Diagnosis not present

## 2021-02-25 DIAGNOSIS — Z7982 Long term (current) use of aspirin: Secondary | ICD-10-CM | POA: Diagnosis not present

## 2021-02-25 DIAGNOSIS — Z96641 Presence of right artificial hip joint: Secondary | ICD-10-CM | POA: Diagnosis not present

## 2021-02-25 DIAGNOSIS — I10 Essential (primary) hypertension: Secondary | ICD-10-CM | POA: Diagnosis not present

## 2021-02-25 DIAGNOSIS — Z87891 Personal history of nicotine dependence: Secondary | ICD-10-CM | POA: Diagnosis not present

## 2021-02-25 DIAGNOSIS — M1612 Unilateral primary osteoarthritis, left hip: Secondary | ICD-10-CM | POA: Diagnosis not present

## 2021-02-25 LAB — CBC
HCT: 37.8 % — ABNORMAL LOW (ref 39.0–52.0)
Hemoglobin: 12.5 g/dL — ABNORMAL LOW (ref 13.0–17.0)
MCH: 30.6 pg (ref 26.0–34.0)
MCHC: 33.1 g/dL (ref 30.0–36.0)
MCV: 92.6 fL (ref 80.0–100.0)
Platelets: 176 10*3/uL (ref 150–400)
RBC: 4.08 MIL/uL — ABNORMAL LOW (ref 4.22–5.81)
RDW: 14.8 % (ref 11.5–15.5)
WBC: 11.7 10*3/uL — ABNORMAL HIGH (ref 4.0–10.5)
nRBC: 0 % (ref 0.0–0.2)

## 2021-02-25 LAB — BASIC METABOLIC PANEL
Anion gap: 8 (ref 5–15)
BUN: 21 mg/dL (ref 8–23)
CO2: 24 mmol/L (ref 22–32)
Calcium: 8.3 mg/dL — ABNORMAL LOW (ref 8.9–10.3)
Chloride: 103 mmol/L (ref 98–111)
Creatinine, Ser: 1.17 mg/dL (ref 0.61–1.24)
GFR, Estimated: >60 mL/min (ref >60)
Glucose, Bld: 163 mg/dL — ABNORMAL HIGH (ref 70–99)
Potassium: 4.2 mmol/L (ref 3.5–5.1)
Sodium: 135 mmol/L (ref 135–145)

## 2021-02-25 MED ORDER — HYDROCODONE-ACETAMINOPHEN 5-325 MG PO TABS: 1.0000 | ORAL_TABLET | ORAL | 0 refills | Status: DC | PRN

## 2021-02-25 MED ORDER — RIVAROXABAN 10 MG PO TABS: 10.0000 mg | ORAL_TABLET | Freq: Every day | ORAL | 0 refills | Status: DC

## 2021-02-25 MED ORDER — DOCUSATE SODIUM 100 MG PO CAPS: 100.0000 mg | ORAL_CAPSULE | Freq: Two times a day (BID) | ORAL | 0 refills | Status: DC

## 2021-02-25 MED ORDER — POLYETHYLENE GLYCOL 3350 17 G PO PACK: 17.0000 g | PACK | Freq: Every day | ORAL | 0 refills | Status: DC | PRN

## 2021-02-25 MED ORDER — METHOCARBAMOL 500 MG PO TABS: 500.0000 mg | ORAL_TABLET | Freq: Four times a day (QID) | ORAL | 0 refills | Status: DC | PRN

## 2021-02-25 NOTE — Plan of Care (Signed)

## 2021-02-25 NOTE — Progress Notes (Signed)
Provided discharge education/instructions, all questions and concerns addressed, Pt not in acute distress/ Pt to discharge home with belongings accompanied by grand daughter.

## 2021-02-25 NOTE — Telephone Encounter (Signed)
Call placed to pharmacy and [facility].

## 2021-02-25 NOTE — Progress Notes (Signed)
RT NOTE:  Pt wishes to use his home inhaler instead of the hospital provided inhaler, RT will send back to pharmacy.

## 2021-02-25 NOTE — Progress Notes (Signed)
Physical Therapy Treatment Patient Details Name: Brendan Hopkins MRN: [id number] DOB: 11/24/1935 Today's Date: 02/25/2021   History of Present Illness Patient is 85 y.o. male s/p Lt THA anterior approach on 02/24/21 with PMH significant for OA, COPD, GERD, HOH, HTN, RBBB, TIA, Rt THA in 2014.    PT Comments    POD # 1 am session Pt already OOB in recliner.  General Comments: AxO x 3 but very HOH and required repeat instructions to retain.  Assisted with amb in hallway.  General transfer comment: <25% VC's on proper hand placement and safety with turns.  General Gait Details: 50% VC's on proper walker to self distance and safety with turns. General stair comments: 75% VC's on proper sequencing and safety practiced 2 steps with B rails.  Then returned to room to perform some TE's following HEP handout.  Instructed on proper tech, freq as well as use of ICE.   Pt will need another PT session to complete HEP and practice stair again to ensure a safe D/C later today.   Recommendations for follow up therapy are one component of a multi-disciplinary discharge planning process, led by the attending physician.  Recommendations may be updated based on patient status, additional functional criteria and insurance authorization.  Follow Up Recommendations  Home health PT     Equipment Recommendations  None recommended by PT    Recommendations for Other Services       Precautions / Restrictions Precautions Precautions: Fall Restrictions Weight Bearing Restrictions: No LLE Weight Bearing: Weight bearing as tolerated     Mobility  Bed Mobility               General bed mobility comments: OOB in recliner    Transfers Overall transfer level: Needs assistance Equipment used: Rolling walker (2 wheeled) Transfers: Sit to/from Stand Sit to Stand: Supervision;Min guard         General transfer comment: <25% VC's on proper hand placement and safety with  turns  Ambulation/Gait Ambulation/Gait assistance: Min guard;Min assist Gait Distance (Feet): 55 Feet Assistive device: Rolling walker (2 wheeled) Gait Pattern/deviations: Step-to pattern;Decreased stride length;Decreased step length - left;Decreased weight shift to left Gait velocity: decr   General Gait Details: 50% VC's on proper walker to self distance and safety with turns.   Stairs Stairs: Yes Stairs assistance: Min guard Stair Management: Two rails;Step to pattern;Forwards Number of Stairs: 2 General stair comments: 75% VC's on proper sequencing and safety   Wheelchair Mobility    Modified Rankin (Stroke Patients Only)       Balance                                            Cognition Arousal/Alertness: Awake/alert Behavior During Therapy: WFL for tasks assessed/performed Overall Cognitive Status: Within Functional Limits for tasks assessed                                 General Comments: AxO x 3 but very HOH and required repeat instructions to retain      Exercises  Total Hip Replacement TE's following HEP Handout 10 reps ankle pumps 05 reps knee presses 05 reps heel slides 05 reps SAQ's 05 reps ABD Instructed how to use a belt loop to assist  Followed by ICE     General Comments  Pertinent Vitals/Pain Pain Assessment: 0-10 Pain Score: 5  Pain Location: Lt hip Pain Descriptors / Indicators: Aching;Discomfort;Grimacing;Moaning;Operative site guarding Pain Intervention(s): Monitored during session;Premedicated before session;Repositioned;Ice applied    Home Living                      Prior Function            PT Goals (current goals can now be found in the care plan section) Progress towards PT goals: Progressing toward goals    Frequency    7X/week      PT Plan Current plan remains appropriate    Co-evaluation              AM-PAC PT "6 Clicks" Mobility   Outcome Measure   Help needed turning from your back to your side while in a flat bed without using bedrails?: A Little Help needed moving from lying on your back to sitting on the side of a flat bed without using bedrails?: A Little Help needed moving to and from a bed to a chair (including a wheelchair)?: A Little Help needed standing up from a chair using your arms (e.g., wheelchair or bedside chair)?: A Little Help needed to walk in hospital room?: A Little   6 Click Score: 15    End of Session Equipment Utilized During Treatment: Gait belt Activity Tolerance: Patient tolerated treatment well Patient left: in chair;with call bell/phone within reach;with chair alarm set;with family/visitor present Nurse Communication: Mobility status;Patient requests pain meds PT Visit Diagnosis: Muscle weakness (generalized) (M62.81);Difficulty in walking, not elsewhere classified (R26.2)     Time: 5638-9373 PT Time Calculation (min) (ACUTE ONLY): 26 min  Charges:  $Gait Training: 8-22 mins $Therapeutic Exercise: 8-22 mins                     {Denean Pavon  PTA Acute  Rehabilitation Services Pager      [phone] Office      [phone]

## 2021-02-25 NOTE — Telephone Encounter (Signed)
Brendan Hopkins is calling in reference to pts zolpidem ([email or web address])10 MG would like to have a call back.

## 2021-02-25 NOTE — Progress Notes (Signed)
Physical Therapy Treatment Patient Details Name: Brendan Hopkins MRN: [id number] DOB: 1935-08-18 Today's Date: 02/25/2021   History of Present Illness Patient is 85 y.o. male s/p Lt THA anterior approach on 02/24/21 with PMH significant for OA, COPD, GERD, HOH, HTN, RBBB, TIA, Rt THA in 2014.    PT Comments    POD # 1 pm session Pt OOB in recliner eager to D/C to home.  "I have got things to do".  Granddaughter coming to pick him up.  Assisted with amb a greater distance in hallway.  Practiced stairs again which pt was safely able to perform.  Complete HEP standing TE's.  Educated on level of activity.  Addressed all mobility questions, discussed appropriate activity, educated on use of ICE.  Pt ready for D/C to home.   Recommendations for follow up therapy are one component of a multi-disciplinary discharge planning process, led by the attending physician.  Recommendations may be updated based on patient status, additional functional criteria and insurance authorization.  Follow Up Recommendations  Home health PT     Equipment Recommendations  None recommended by PT    Recommendations for Other Services       Precautions / Restrictions Precautions Precautions: Fall Restrictions Weight Bearing Restrictions: No LLE Weight Bearing: Weight bearing as tolerated     Mobility  Bed Mobility               General bed mobility comments: OOB in recliner    Transfers Overall transfer level: Needs assistance Equipment used: Rolling walker (2 wheeled) Transfers: Sit to/from Stand Sit to Stand: Supervision;Min guard         General transfer comment: <25% VC's on proper hand placement and safety with turns  Ambulation/Gait Ambulation/Gait assistance: Min guard;Min assist Gait Distance (Feet): 62 Feet Assistive device: Rolling walker (2 wheeled) Gait Pattern/deviations: Decreased step length - left Gait velocity: decr   General Gait Details: 25% VC's on proper walker  to self distance and safety with turns.  Also assisted with amb to and from bathroom.   Stairs Stairs: Yes Stairs assistance: Min guard Stair Management: Two rails;Step to pattern;Forwards Number of Stairs: 2 General stair comments: <25% VC's on proper sequencing and safety as pt was able to recall from earlier.   Wheelchair Mobility    Modified Rankin (Stroke Patients Only)       Balance                                            Cognition Arousal/Alertness: Awake/alert Behavior During Therapy: WFL for tasks assessed/performed Overall Cognitive Status: Within Functional Limits for tasks assessed                                 General Comments: AxO x 3 but very HOH and required repeat instructions to retain      Exercises  5 reps all standing TE's following HEP handout    General Comments        Pertinent Vitals/Pain Pain Assessment: 0-10 Pain Score: 5  Pain Location: Lt hip Pain Descriptors / Indicators: Aching;Discomfort;Grimacing;Moaning;Operative site guarding Pain Intervention(s): Monitored during session;Premedicated before session;Repositioned;Ice applied    Home Living                      Prior Function  PT Goals (current goals can now be found in the care plan section) Progress towards PT goals: Progressing toward goals    Frequency    7X/week      PT Plan Current plan remains appropriate    Co-evaluation              AM-PAC PT "6 Clicks" Mobility   Outcome Measure  Help needed turning from your back to your side while in a flat bed without using bedrails?: A Little Help needed moving from lying on your back to sitting on the side of a flat bed without using bedrails?: A Little Help needed moving to and from a bed to a chair (including a wheelchair)?: A Little Help needed standing up from a chair using your arms (e.g., wheelchair or bedside chair)?: A Little Help needed to walk  in hospital room?: A Little   6 Click Score: 15    End of Session Equipment Utilized During Treatment: Gait belt Activity Tolerance: Patient tolerated treatment well Patient left: in chair;with call bell/phone within reach;with chair alarm set;with family/visitor present Nurse Communication: Mobility status;Patient requests pain meds PT Visit Diagnosis: Muscle weakness (generalized) (M62.81);Difficulty in walking, not elsewhere classified (R26.2)     Time: 7867-5449 PT Time Calculation (min) (ACUTE ONLY): 15 min  Charges:  $Gait Training: 8-22 mins                      Rica Koyanagi  PTA Acute  Rehabilitation Services Pager      [phone] Office      [phone]

## 2021-02-25 NOTE — Progress Notes (Signed)
Subjective: 1 Day Post-Op Procedure(s) (LRB): TOTAL HIP ARTHROPLASTY ANTERIOR APPROACH (Left) Patient reports pain as mild.   Patient seen in rounds by Dr. Alvan Dame. Patient is well, and has had no acute complaints or problems overnight. Ambulated 40 feet with PT yesterday.  We will continue therapy today.   Objective: Vital signs in last 24 hours: Temp:  [97.6 F (36.4 C)-99.2 F (37.3 C)] 98.8 F (37.1 C) (10/05 0508) Pulse Rate:  [53-89] 70 (10/05 0508) Resp:  [11-22] 16 (10/05 0508) BP: (97-148)/(62-89) 124/69 (10/05 0508) SpO2:  [87 %-99 %] 93 % (10/05 0508)  Intake/Output from previous day:  Intake/Output Summary (Last 24 hours) at 02/25/2021 0728 Last data filed at 02/25/2021 0508 Gross per 24 hour  Intake 2515.52 ml  Output 1750 ml  Net 765.52 ml     Intake/Output this shift: No intake/output data recorded.  Labs: Recent Labs    02/25/21 0336  HGB 12.5*   Recent Labs    02/25/21 0336  WBC 11.7*  RBC 4.08*  HCT 37.8*  PLT 176   Recent Labs    02/25/21 0336  NA 135  K 4.2  CL 103  CO2 24  BUN 21  CREATININE 1.17  GLUCOSE 163*  CALCIUM 8.3*   No results for input(s): LABPT, INR in the last 72 hours.  Exam: General - Patient is Alert and Oriented Extremity - Neurologically intact Sensation intact distally Intact pulses distally Dorsiflexion/Plantar flexion intact Dressing - dressing C/D/I Motor Function - intact, moving foot and toes well on exam.   Past Medical History:  Diagnosis Date   Acute deep vein thrombosis (DVT) of tibial vein of right lower extremity (HCC)    after ankle fracture/surgery (2/21)   Arthritis    RIGHT HIP, HANDS, BACK   Blood clot in vein    right lower leg   Cancer (HCC)    Skin cancer    Cataract    Cervical radiculopathy    severe C3-4 neuroforaminal stenosis, C5-6 left synovial cyst compressing left nerve root.   Colon polyps    2005   COPD (chronic obstructive pulmonary disease) ([place])    SMOKED FOR 67  YRS   Diverticulosis    2005   Fatty liver    GERD (gastroesophageal reflux disease)    Hard of hearing    History of kidney stones    Hypertension    Leg fracture, right    Pain    RIGHT HIP AND BACK   Personal history of colonic polyps-adenoma and sessile serrated polyp 07/15/2010   Pneumonia 03/2019   RBBB    POSS HX AF IN PAST- PAC'S   Shortness of breath    WITH EXERTION ONLY   Stroke ([place])    TIA   TIA (transient ischemic attack)    Wears glasses     Assessment/Plan: 1 Day Post-Op Procedure(s) (LRB): TOTAL HIP ARTHROPLASTY ANTERIOR APPROACH (Left) Active Problems:   S/P left total hip arthroplasty   S/P total left hip arthroplasty  Estimated body mass index is 35.3 kg/m as calculated from the following:   Height as of this encounter: 5\' 10"  (1.778 m).   Weight as of this encounter: 111.6 kg. Advance diet Up with therapy D/C IV fluids  DVT Prophylaxis - Xarelto Weight bearing as tolerated.  Plan is to go Home after hospital stay. Plan for discharge today following 1-2 sessions of therapy as long as he is meeting his goals. Follow up in the office in 2 weeks.  Griffith Citron, PA-C Orthopedic Surgery [phone] 02/25/2021, 7:28 AM

## 2021-02-27 MED ORDER — ZOLPIDEM TARTRATE 10 MG PO TABS: 10.0000 mg | ORAL_TABLET | Freq: Every evening | ORAL | 1 refills | Status: DC | PRN

## 2021-02-27 NOTE — Telephone Encounter (Signed)
Call placed to pharmacy.   Was advised that patient is requesting refills to be sent to mail order.   Ok to refill??  Last office visit 11/28/2020.   Last refill 02/09/2021 to retail.

## 2021-02-27 NOTE — Addendum Note (Signed)
Addended by: Sheral Flow on: 02/27/2021 09:53 AM   Modules accepted: Orders

## 2021-02-27 NOTE — Discharge Summary (Signed)
Physician Discharge Summary   Patient ID: Brendan Hopkins MRN: [id number] DOB/AGE: 83-Aug-1937 83 y.o.  Admit date: 02/24/2021 Discharge date: 02/25/2021  Primary Diagnosis: Left  hip osteoarthritis.  Admission Diagnoses:  Past Medical History:  Diagnosis Date   Acute deep vein thrombosis (DVT) of tibial vein of right lower extremity (HCC)    after ankle fracture/surgery (2/21)   Arthritis    RIGHT HIP, HANDS, BACK   Blood clot in vein    right lower leg   Cancer (HCC)    Skin cancer    Cataract    Cervical radiculopathy    severe C3-4 neuroforaminal stenosis, C5-6 left synovial cyst compressing left nerve root.   Colon polyps    2005   COPD (chronic obstructive pulmonary disease) ([place])    SMOKED FOR 73 YRS   Diverticulosis    2005   Fatty liver    GERD (gastroesophageal reflux disease)    Hard of hearing    History of kidney stones    Hypertension    Leg fracture, right    Pain    RIGHT HIP AND BACK   Personal history of colonic polyps-adenoma and sessile serrated polyp 07/15/2010   Pneumonia 03/2019   RBBB    POSS HX AF IN PAST- PAC'S   Shortness of breath    WITH EXERTION ONLY   Stroke ([place])    TIA   TIA (transient ischemic attack)    Wears glasses    Discharge Diagnoses:   Active Problems:   S/P left total hip arthroplasty   S/P total left hip arthroplasty  Estimated body mass index is 35.3 kg/m as calculated from the following:   Height as of this encounter: 5\' 10"  (1.778 m).   Weight as of this encounter: 111.6 kg.  Procedure:  Procedure(s) (LRB): TOTAL HIP ARTHROPLASTY ANTERIOR APPROACH (Left)   Consults: None  HPI: Brendan Hopkins is a 85 y.o. male who had   presented to office for evaluation of left hip pain.  Radiographs revealed   progressive degenerative changes with bone-on-bone   articulation of the  hip joint, including subchondral cystic changes and osteophytes.  The patient had painful limited range of   motion significantly  affecting their overall quality of life and function.  The patient was failing to    respond to conservative measures including medications and/or injections and activity modification and at this point was ready   to proceed with more definitive measures.  Consent was obtained for   benefit of pain relief.  Specific risks of infection, DVT, component   failure, dislocation, neurovascular injury, and need for revision surgery were reviewed in the office as well discussion of   the anterior versus posterior approach were reviewed.  Laboratory Data: Admission on 02/24/2021, Discharged on 02/25/2021  Component Date Value Ref Range Status   WBC 02/25/2021 11.7 (A) 4.0 - 10.5 K/uL Final   RBC 02/25/2021 4.08 (A) 4.22 - 5.81 MIL/uL Final   Hemoglobin 02/25/2021 12.5 (A) 13.0 - 17.0 g/dL Final   HCT 02/25/2021 37.8 (A) 39.0 - 52.0 % Final   MCV 02/25/2021 92.6  80.0 - 100.0 fL Final   MCH 02/25/2021 30.6  26.0 - 34.0 pg Final   MCHC 02/25/2021 33.1  30.0 - 36.0 g/dL Final   RDW 02/25/2021 14.8  11.5 - 15.5 % Final   Platelets 02/25/2021 176  150 - 400 K/uL Final   nRBC 02/25/2021 0.0  0.0 - 0.2 % Final   Performed at  [facility], [place] [address] [place], [place] [address]   Sodium 02/25/2021 135  135 - 145 mmol/L Final   Potassium 02/25/2021 4.2  3.5 - 5.1 mmol/L Final   Chloride 02/25/2021 103  98 - 111 mmol/L Final   CO2 02/25/2021 24  22 - 32 mmol/L Final   Glucose, Bld 02/25/2021 163 (A) 70 - 99 mg/dL Final   Glucose reference range applies only to samples taken after fasting for at least 8 hours.   BUN 02/25/2021 21  8 - 23 mg/dL Final   Creatinine, Ser 02/25/2021 1.17  0.61 - 1.24 mg/dL Final   Calcium 02/25/2021 8.3 (A) 8.9 - 10.3 mg/dL Final   GFR, Estimated 02/25/2021 >60  >60 mL/min Final   Comment: (NOTE) Calculated using the CKD-EPI Creatinine Equation (2021)    Anion gap 02/25/2021 8  5 - 15 Final   Performed at [facility], [place]  [address] [place], [place] [address]  Orders Only on 02/20/2021  Component Date Value Ref Range Status   SARS Coronavirus 2 02/20/2021 RESULT: NEGATIVE   Corrected   Comment: RESULT: NEGATIVESARS-CoV-2 INTERPRETATION:A NEGATIVE  test result means that SARS-CoV-2 RNA was not present in the specimen above the limit of detection of this test. This does not preclude a possible SARS-CoV-2 infection and should not be used as the  sole basis for patient management decisions. Negative results must be combined with clinical observations, patient history, and epidemiological information. Optimum specimen types and timing for peak viral levels during infections caused by SARS-CoV-2  have not been determined. Collection of multiple specimens or types of specimens may be necessary to detect virus. Improper specimen collection and handling, sequence variability under primers/probes, or organism present below the limit of detection may  lead to false negative results. Positive and negative predictive values of testing are highly dependent on prevalence. False negative test results are more likely when prevalence of disease is high.The expected result is NEGATIVE.Fact S                          heet for  Healthcare Providers: LocalChronicle.no Sheet for Patients: SalonLookup.es Reference Range - Negative   Hospital Outpatient Visit on 02/12/2021  Component Date Value Ref Range Status   WBC 02/12/2021 6.8  4.0 - 10.5 K/uL Final   RBC 02/12/2021 5.05  4.22 - 5.81 MIL/uL Final   Hemoglobin 02/12/2021 15.2  13.0 - 17.0 g/dL Final   HCT 02/12/2021 46.5  39.0 - 52.0 % Final   MCV 02/12/2021 92.1  80.0 - 100.0 fL Final   MCH 02/12/2021 30.1  26.0 - 34.0 pg Final   MCHC 02/12/2021 32.7  30.0 - 36.0 g/dL Final   RDW 02/12/2021 14.7  11.5 - 15.5 % Final   Platelets 02/12/2021 194  150 - 400 K/uL Final   nRBC 02/12/2021 0.0  0.0 - 0.2 % Final   Performed at  [facility], [place] [address] [place], [place] [address]   Sodium 02/12/2021 135  135 - 145 mmol/L Final   Potassium 02/12/2021 4.4  3.5 - 5.1 mmol/L Final   Chloride 02/12/2021 101  98 - 111 mmol/L Final   CO2 02/12/2021 24  22 - 32 mmol/L Final   Glucose, Bld 02/12/2021 112 (A) 70 - 99 mg/dL Final   Glucose reference range applies only to samples taken after fasting for at least 8 hours.   BUN 02/12/2021 23  8 -  23 mg/dL Final   Creatinine, Ser 02/12/2021 1.08  0.61 - 1.24 mg/dL Final   Calcium 02/12/2021 9.3  8.9 - 10.3 mg/dL Final   Total Protein 02/12/2021 8.0  6.5 - 8.1 g/dL Final   Albumin 02/12/2021 4.4  3.5 - 5.0 g/dL Final   AST 02/12/2021 23  15 - 41 U/L Final   ALT 02/12/2021 16  0 - 44 U/L Final   Alkaline Phosphatase 02/12/2021 69  38 - 126 U/L Final   Total Bilirubin 02/12/2021 1.1  0.3 - 1.2 mg/dL Final   GFR, Estimated 02/12/2021 >60  >60 mL/min Final   Comment: (NOTE) Calculated using the CKD-EPI Creatinine Equation (2021)    Anion gap 02/12/2021 10  5 - 15 Final   Performed at [facility], [place] [address] [place], [place] [address]   ABO/RH(D) 02/12/2021 A POS   Final   Antibody Screen 02/12/2021 NEG   Final   Sample Expiration 02/12/2021 02/26/2021,2359   Final   Extend sample reason 02/12/2021    Final                   Value:NO TRANSFUSIONS OR PREGNANCY IN THE PAST 3 MONTHS Performed at [facility] [address], [place], [place] [address]    MRSA, PCR 02/12/2021 NEGATIVE  NEGATIVE Final   Staphylococcus aureus 02/12/2021 NEGATIVE  NEGATIVE Final   Comment: (NOTE) The Xpert SA Assay (FDA approved for NASAL specimens in patients 35 years of age and older), is one component of a comprehensive surveillance program. It is not intended to diagnose infection nor to guide or monitor treatment. Performed at [facility], [place] [address], [place], [place] [address]      X-Rays:DG  Pelvis Portable  Result Date: 02/24/2021 CLINICAL DATA:  Status post left total hip arthroplasty. EXAM: PORTABLE PELVIS 1-2 VIEWS COMPARISON:  Intraoperative fluoroscopic images earlier today FINDINGS: Bilateral total hip arthroplasties are again noted with the prosthetic components appearing normally aligned on this single AP image. No acute fracture is identified. Postoperative gas is noted in the soft tissues about the left hip. IMPRESSION: Recent left total hip arthroplasty without evidence of acute osseous abnormality. Electronically Signed   By: Logan Bores M.D.   On: 02/24/2021 11:28   DG C-Arm 1-60 Min-No Report  Result Date: 02/24/2021 Fluoroscopy was utilized by the requesting physician.  No radiographic interpretation.   DG HIP OPERATIVE UNILAT W OR W/O PELVIS LEFT  Result Date: 02/24/2021 CLINICAL DATA:  Left total hip replacement EXAM: OPERATIVE left HIP (WITH PELVIS IF PERFORMED) 2 VIEWS TECHNIQUE: Fluoroscopic spot image(s) were submitted for interpretation post-operatively. COMPARISON:  Radiograph 01/08/2013 FINDINGS: Intraoperative fluoroscopic images during left total hip arthroplasty. Hardware is intact without evidence of loosening or periprosthetic fracture. Normal alignment. Right hip arthroplasty is in normal alignment. IMPRESSION: Intraoperative images during left total hip arthroplasty. No evidence of immediate complication. Electronically Signed   By: Maurine Simmering M.D.   On: 02/24/2021 10:36    EKG: Orders placed or performed during the hospital encounter of 02/12/21   EKG 12 lead per protocol   EKG 12 lead per protocol     Hospital Course: Brendan Hopkins is a 85 y.o. who was admitted to [facility]. They were brought to the operating room on 02/24/2021 and underwent Procedure(s): [place].  Patient tolerated the procedure well and was later transferred to the recovery room and then to the orthopaedic floor for postoperative  care. They were given PO and IV analgesics for pain control following their surgery. They were given 24 hours of postoperative antibiotics of  Anti-infectives (From admission, onward)    Start     Dose/Rate Route Frequency Ordered Stop   02/24/21 1600  ceFAZolin (ANCEF) IVPB 2g/100 mL premix        2 g 200 mL/hr over 30 Minutes Intravenous Every 6 hours 02/24/21 1509 02/24/21 2258   02/24/21 0630  ceFAZolin (ANCEF) IVPB 2g/100 mL premix        2 g 200 mL/hr over 30 Minutes Intravenous On call to O.R. 02/24/21 3536 02/24/21 0903      and started on DVT prophylaxis in the form of Xarelto.   PT and OT were ordered for total joint protocol. Discharge planning consulted to help with postop disposition and equipment needs.  Patient had a good night on the evening of surgery. They started to get up OOB with therapy on POD #0. Pt was seen during rounds and was ready to go home pending progress with therapy.He worked with therapy on POD #1 and was meeting his goals. Pt was discharged to home later that day in stable condition.  Diet: Regular diet Activity: WBAT Follow-up: in 2 weeks Disposition: Home Discharged Condition: good   Discharge Instructions     Call MD / Call 911   Complete by: As directed    If you experience chest pain or shortness of breath, CALL 911 and be transported to the hospital emergency room.  If you develope a fever above 101 F, pus (white drainage) or increased drainage or redness at the wound, or calf pain, call your surgeon's office.   Change dressing   Complete by: As directed    Maintain surgical dressing until follow up in the clinic. If the edges start to pull up, may reinforce with tape. If the dressing is no longer working, may remove and cover with gauze and tape, but must keep the area dry and clean.  Call with any questions or concerns.   Constipation Prevention   Complete by: As directed    Drink plenty of fluids.  Prune juice may be helpful.  You may use a  stool softener, such as Colace (over the counter) 100 mg twice a day.  Use MiraLax (over the counter) for constipation as needed.   Diet - low sodium heart healthy   Complete by: As directed    Increase activity slowly as tolerated   Complete by: As directed    Weight bearing as tolerated with assist device (walker, cane, etc) as directed, use it as long as suggested by your surgeon or therapist, typically at least 4-6 weeks.   Post-operative opioid taper instructions:   Complete by: As directed    POST-OPERATIVE OPIOID TAPER INSTRUCTIONS: It is important to wean off of your opioid medication as soon as possible. If you do not need pain medication after your surgery it is ok to stop day one. Opioids include: Codeine, Hydrocodone(Norco, Vicodin), Oxycodone(Percocet, oxycontin) and hydromorphone amongst others.  Long term and even short term use of opiods can cause: Increased pain response Dependence Constipation Depression Respiratory depression And more.  Withdrawal symptoms can include Flu like symptoms Nausea, vomiting And more Techniques to manage these symptoms Hydrate well Eat regular healthy meals Stay active Use relaxation techniques(deep breathing, meditating, yoga) Do Not substitute Alcohol to help with tapering If you have been on opioids for less than two weeks and do not have pain than  it is ok to stop all together.  Plan to wean off of opioids This plan should start within one week post op of your joint replacement. Maintain the same interval or time between taking each dose and first decrease the dose.  Cut the total daily intake of opioids by one tablet each day Next start to increase the time between doses. The last dose that should be eliminated is the evening dose.      TED hose   Complete by: As directed    Use stockings (TED hose) for 2 weeks on both leg(s).  You may remove them at night for sleeping.      Allergies as of 02/25/2021   No Known  Allergies      Medication List     STOP taking these medications    acetaminophen 650 MG CR tablet Commonly known as: TYLENOL   aspirin EC 81 MG tablet   Emu Oil Oil   Fish Oil 1000 MG Caps       TAKE these medications    albuterol 108 (90 Base) MCG/ACT inhaler Commonly known as: VENTOLIN HFA Inhale 1-2 puffs into the lungs every 6 (six) hours as needed for wheezing or shortness of breath. Notes to patient: Resume home regimen   amLODipine 5 MG tablet Commonly known as: NORVASC Take 1 tablet (5 mg total) by mouth at bedtime. Notes to patient: Resume home regimen   Anoro Ellipta 62.5-25 MCG/INH Aepb Generic drug: umeclidinium-vilanterol Inhale 1 puff into the lungs daily.   docusate sodium 100 MG capsule Commonly known as: COLACE Take 1 capsule (100 mg total) by mouth 2 (two) times daily.   furosemide 40 MG tablet Commonly known as: LASIX Take 1 tablet (40 mg total) by mouth daily.   HYDROcodone-acetaminophen 5-325 MG tablet Commonly known as: NORCO/VICODIN Take 1 tablet by mouth every 4 (four) hours as needed for severe pain. Notes to patient: Last dose given 10/05   meclizine 25 MG tablet Commonly known as: ANTIVERT TAKE ONE TABLET BY MOUTH THREE TIMES DAILY AS NEEDED FOR DIZZINESS What changed:  how much to take how to take this when to take this reasons to take this Notes to patient: Resume home regimen   methocarbamol 500 MG tablet Commonly known as: ROBAXIN Take 1 tablet (500 mg total) by mouth every 6 (six) hours as needed for muscle spasms. Notes to patient: Last dose given 10/05   multivitamin with minerals Tabs tablet Take 1 tablet by mouth daily. Notes to patient: Resume home regimen   pantoprazole 40 MG tablet Commonly known as: PROTONIX Take 1 tablet by mouth once daily   polyethylene glycol 17 g packet Commonly known as: MIRALAX / GLYCOLAX Take 17 g by mouth daily as needed for mild constipation.   rivaroxaban 10 MG Tabs  tablet Commonly known as: XARELTO Take 1 tablet (10 mg total) by mouth daily with breakfast for 14 days. Then take aspirin 81 mg twice daily for a month.   triamcinolone cream 0.1 % Commonly known as: KENALOG Apply 1 application topically 2 (two) times daily as needed (irritation). Notes to patient: Resume home regimen   valsartan 80 MG tablet Commonly known as: Diovan Take 1 tablet (80 mg total) by mouth daily.   zolpidem 10 MG tablet Commonly known as: AMBIEN Take 1 tablet (10 mg total) by mouth at bedtime as needed for sleep. What changed: when to take this Notes to patient: Last dos egiven 10/04 10:14pm  Discharge Care Instructions  (From admission, onward)           Start     Ordered   02/25/21 0000  Change dressing       Comments: Maintain surgical dressing until follow up in the clinic. If the edges start to pull up, may reinforce with tape. If the dressing is no longer working, may remove and cover with gauze and tape, but must keep the area dry and clean.  Call with any questions or concerns.   02/25/21 5430            Follow-up Information     Paralee Cancel, MD. Go on 03/16/2021.   Specialty: Orthopedic Surgery Why: You are scheduled for a follow up appointment on 03-16-21 at 8:40 am. Contact information: [address] [place] [address] [phone]                 Signed: Griffith Citron, PA-C Orthopedic Surgery 02/27/2021, 8:13 AM

## 2021-03-05 ENCOUNTER — Encounter: Payer: Self-pay | Admitting: Family Medicine

## 2021-03-05 ENCOUNTER — Other Ambulatory Visit: Payer: Self-pay

## 2021-03-05 ENCOUNTER — Ambulatory Visit (INDEPENDENT_AMBULATORY_CARE_PROVIDER_SITE_OTHER): Payer: Medicare Other | Admitting: Family Medicine

## 2021-03-05 VITALS — BP 126/78 | HR 90 | Temp 97.8°F | Resp 12 | Ht 69.0 in | Wt 254.0 lb

## 2021-03-05 DIAGNOSIS — Z86718 Personal history of other venous thrombosis and embolism: Secondary | ICD-10-CM

## 2021-03-05 DIAGNOSIS — I10 Essential (primary) hypertension: Secondary | ICD-10-CM

## 2021-03-05 DIAGNOSIS — M7989 Other specified soft tissue disorders: Secondary | ICD-10-CM | POA: Diagnosis not present

## 2021-03-05 DIAGNOSIS — Z96642 Presence of left artificial hip joint: Secondary | ICD-10-CM

## 2021-03-05 DIAGNOSIS — J449 Chronic obstructive pulmonary disease, unspecified: Secondary | ICD-10-CM

## 2021-03-05 NOTE — Progress Notes (Signed)
Subjective:    Patient ID: Brendan Hopkins, male    DOB: [birth year], 85 y.o.   MRN: [id number]  HPI Patient was recently admitted to the hospital and I have copied the most recent discharge summary below for my reference: Admit date: 02/24/2021 Discharge date: 02/25/2021   Primary Diagnosis: Left  hip osteoarthritis.   Discharge Diagnoses:   Active Problems:   S/P left total hip arthroplasty   S/P total left hip arthroplasty   Estimated body mass index is 35.3 kg/m as calculated from the following:   Height as of this encounter: 5\' 10"  (1.778 m).   Weight as of this encounter: 111.6 kg.   Procedure:  Procedure(s) (LRB): TOTAL HIP ARTHROPLASTY ANTERIOR APPROACH (Left)    Consults: None   HPI: Brendan Hopkins is a 85 y.o. male who had   presented to office for evaluation of left hip pain.  Radiographs revealed   progressive degenerative changes with bone-on-bone   articulation of the  hip joint, including subchondral cystic changes and osteophytes.  The patient had painful limited range of   motion significantly affecting their overall quality of life and function.  The patient was failing to    respond to conservative measures including medications and/or injections and activity modification and at this point was ready   to proceed with more definitive measures.  Consent was obtained for   benefit of pain relief.  Specific risks of infection, DVT, component   failure, dislocation, neurovascular injury, and need for revision surgery were reviewed in the office as well discussion of   the anterior versus posterior approach were reviewed.   Laboratory Data:         Admission on 02/24/2021, Discharged on 02/25/2021  Component Date Value Ref Range Status    WBC 02/25/2021 11.7 (A) 4.0 - 10.5 K/uL Final   RBC 02/25/2021 4.08 (A) 4.22 - 5.81 MIL/uL Final   Hemoglobin 02/25/2021 12.5 (A) 13.0 - 17.0 g/dL Final   HCT 02/25/2021 37.8 (A) 39.0 - 52.0 % Final   MCV 02/25/2021  92.6  80.0 - 100.0 fL Final   MCH 02/25/2021 30.6  26.0 - 34.0 pg Final   MCHC 02/25/2021 33.1  30.0 - 36.0 g/dL Final   RDW 02/25/2021 14.8  11.5 - 15.5 % Final   Platelets 02/25/2021 176  150 - 400 K/uL Final   nRBC 02/25/2021 0.0  0.0 - 0.2 % Final    Performed at [facility], [place] [address], [place], [place] [address]   Sodium 02/25/2021 135  135 - 145 mmol/L Final   Potassium 02/25/2021 4.2  3.5 - 5.1 mmol/L Final   Chloride 02/25/2021 103  98 - 111 mmol/L Final   CO2 02/25/2021 24  22 - 32 mmol/L Final   Glucose, Bld 02/25/2021 163 (A) 70 - 99 mg/dL Final    Glucose reference range applies only to samples taken after fasting for at least 8 hours.   BUN 02/25/2021 21  8 - 23 mg/dL Final   Creatinine, Ser 02/25/2021 1.17  0.61 - 1.24 mg/dL Final   Calcium 02/25/2021 8.3 (A) 8.9 - 10.3 mg/dL Final   GFR, Estimated 02/25/2021 >60  >60 mL/min Final    Comment: (NOTE) Calculated using the CKD-EPI Creatinine Equation (2021)     Anion gap 02/25/2021 8  5 - 15 Final    Performed at [facility], [place] [address], [place], [place] [address]  Orders Only on 02/20/2021  Component Date  Value Ref Range Status    SARS Coronavirus 2 02/20/2021 RESULT: NEGATIVE    Corrected    Comment: RESULT: NEGATIVESARS-CoV-2 INTERPRETATION:A NEGATIVE  test result means that SARS-CoV-2 RNA was not present in the specimen above the limit of detection of this test. This does not preclude a possible SARS-CoV-2 infection and should not be used as the  sole basis for patient management decisions. Negative results must be combined with clinical observations, patient history, and epidemiological information. Optimum specimen types and timing for peak viral levels during infections caused by SARS-CoV-2  have not been determined. Collection of multiple specimens or types of specimens may be necessary to detect virus. Improper specimen collection and handling, sequence variability under  primers/probes, or organism present below the limit of detection may  lead to false negative results. Positive and negative predictive values of testing are highly dependent on prevalence. False negative test results are more likely when prevalence of disease is high.The expected result is NEGATIVE.Fact S                           heet for  Healthcare Providers: LocalChronicle.no Sheet for Patients: SalonLookup.es Reference Range - Negative    Hospital Outpatient Visit on 02/12/2021  Component Date Value Ref Range Status    WBC 02/12/2021 6.8  4.0 - 10.5 K/uL Final   RBC 02/12/2021 5.05  4.22 - 5.81 MIL/uL Final   Hemoglobin 02/12/2021 15.2  13.0 - 17.0 g/dL Final   HCT 02/12/2021 46.5  39.0 - 52.0 % Final   MCV 02/12/2021 92.1  80.0 - 100.0 fL Final   MCH 02/12/2021 30.1  26.0 - 34.0 pg Final   MCHC 02/12/2021 32.7  30.0 - 36.0 g/dL Final   RDW 02/12/2021 14.7  11.5 - 15.5 % Final   Platelets 02/12/2021 194  150 - 400 K/uL Final   nRBC 02/12/2021 0.0  0.0 - 0.2 % Final    Performed at [facility], [place] [address], [place], [place] [address]   Sodium 02/12/2021 135  135 - 145 mmol/L Final   Potassium 02/12/2021 4.4  3.5 - 5.1 mmol/L Final   Chloride 02/12/2021 101  98 - 111 mmol/L Final   CO2 02/12/2021 24  22 - 32 mmol/L Final   Glucose, Bld 02/12/2021 112 (A) 70 - 99 mg/dL Final    Glucose reference range applies only to samples taken after fasting for at least 8 hours.   BUN 02/12/2021 23  8 - 23 mg/dL Final   Creatinine, Ser 02/12/2021 1.08  0.61 - 1.24 mg/dL Final   Calcium 02/12/2021 9.3  8.9 - 10.3 mg/dL Final   Total Protein 02/12/2021 8.0  6.5 - 8.1 g/dL Final   Albumin 02/12/2021 4.4  3.5 - 5.0 g/dL Final   AST 02/12/2021 23  15 - 41 U/L Final   ALT 02/12/2021 16  0 - 44 U/L Final   Alkaline Phosphatase 02/12/2021 69  38 - 126 U/L Final   Total Bilirubin 02/12/2021 1.1  0.3 - 1.2 mg/dL Final    GFR, Estimated 02/12/2021 >60  >60 mL/min Final    Comment: (NOTE) Calculated using the CKD-EPI Creatinine Equation (2021)     Anion gap 02/12/2021 10  5 - 15 Final    Performed at [facility], [place] [address] [place], [place] [address]     X-Rays: Imaging Results  DG Pelvis Portable   Result Date: 02/24/2021 CLINICAL DATA:  Status  post left total hip arthroplasty. EXAM: PORTABLE PELVIS 1-2 VIEWS COMPARISON:  Intraoperative fluoroscopic images earlier today FINDINGS: Bilateral total hip arthroplasties are again noted with the prosthetic components appearing normally aligned on this single AP image. No acute fracture is identified. Postoperative gas is noted in the soft tissues about the left hip. IMPRESSION: Recent left total hip arthroplasty without evidence of acute osseous abnormality. Electronically Signed   By: Logan Bores M.D.   On: 02/24/2021 11:28    DG C-Arm 1-60 Min-No Report   Result Date: 02/24/2021 Fluoroscopy was utilized by the requesting physician.  No radiographic interpretation.    DG HIP OPERATIVE UNILAT W OR W/O PELVIS LEFT   Result Date: 02/24/2021 CLINICAL DATA:  Left total hip replacement EXAM: OPERATIVE left HIP (WITH PELVIS IF PERFORMED) 2 VIEWS TECHNIQUE: Fluoroscopic spot image(s) were submitted for interpretation post-operatively. COMPARISON:  Radiograph 01/08/2013 FINDINGS: Intraoperative fluoroscopic images during left total hip arthroplasty. Hardware is intact without evidence of loosening or periprosthetic fracture. Normal alignment. Right hip arthroplasty is in normal alignment. IMPRESSION: Intraoperative images during left total hip arthroplasty. No evidence of immediate complication. Electronically Signed   By: Maurine Simmering M.D.   On: 02/24/2021 10:36         Hospital Course: Brendan Hopkins is a 85 y.o. who was admitted to [facility]. They were brought to the operating room on 02/24/2021 and underwent Procedure(s): [place].  Patient tolerated the procedure well and was later transferred to the recovery room and then to the orthopaedic floor for postoperative care. They were given PO and IV analgesics for pain control following their surgery. They were given 24 hours of postoperative antibiotics of keflex.  and started on DVT prophylaxis in the form of Xarelto.   PT and OT were ordered for total joint protocol. Discharge planning consulted to help with postop disposition and equipment needs.  Patient had a good night on the evening of surgery. They started to get up OOB with therapy on POD #0. Pt was seen during rounds and was ready to go home pending progress with therapy.He worked with therapy on POD #1 and was meeting his goals. Pt was discharged to home later that day in stable condition.  03/05/21  Patient has a history of a DVT.  He was discharged home on Xarelto 10 mg a day.  However he was confused and took old samples of 2.5 mg Xarelto that he had.  Therefore for the last week he has been on 2.5 mg Xarelto daily.  His left leg is very swollen distal to the knee.  Is also pink and bruised.  There is yellow discoloration to the skin as well as purple bruising all along the lateral shin.  There is also some pink erythema due to the amount of swelling in the mid shin and in the ankle.  Patient has +2 pitting edema all throughout the left leg which is more than his baseline. Past Medical History:  Diagnosis Date   Acute deep vein thrombosis (DVT) of tibial vein of right lower extremity (HCC)    after ankle fracture/surgery (2/21)   Arthritis    RIGHT HIP, HANDS, BACK   Blood clot in vein    right lower leg   Cancer (HCC)    Skin cancer    Cataract    Cervical radiculopathy    severe C3-4 neuroforaminal stenosis, C5-6 left synovial cyst compressing left nerve root.   Colon polyps  2005   COPD (chronic obstructive pulmonary disease) ([place])    SMOKED FOR 38 YRS   Diverticulosis     2005   Fatty liver    GERD (gastroesophageal reflux disease)    Hard of hearing    History of kidney stones    Hypertension    Leg fracture, right    Pain    RIGHT HIP AND BACK   Personal history of colonic polyps-adenoma and sessile serrated polyp 07/15/2010   Pneumonia 03/2019   RBBB    POSS HX AF IN PAST- PAC'S   Shortness of breath    WITH EXERTION ONLY   Stroke ([place])    TIA   TIA (transient ischemic attack)    Wears glasses    Past Surgical History:  Procedure Laterality Date   COLONOSCOPY     maybe 3    COLONOSCOPY W/ POLYPECTOMY     CYSTOSCOPY WITH RETROGRADE PYELOGRAM, URETEROSCOPY AND STENT PLACEMENT Left 12/11/2019   Procedure: CYSTOSCOPY WITH LEFT RETROGRADE LEFT  URETEROSCOPY WITH HOLMIUM LASER AND STENT PLACEMENT;  Surgeon: Irine Seal, MD;  Location: [facility];  Service: Urology;  Laterality: Left;   ORIF ANKLE FRACTURE Right 07/11/2019   ORIF ANKLE FRACTURE Right 07/11/2019   Procedure: OPEN REDUCTION INTERNAL FIXATION (ORIF) ANKLE FRACTURE;  Surgeon: Shona Needles, MD;  Location: [facility];  Service: Orthopedics;  Laterality: Right;   skin cancers removed      TOTAL HIP ARTHROPLASTY Right 01/09/2013   Procedure: RIGHT TOTAL HIP ARTHROPLASTY ANTERIOR APPROACH;  Surgeon: [name], MD;  Location: [facility];  Service: Orthopedics;  Laterality: Right;   TOTAL HIP ARTHROPLASTY Left 02/24/2021   Procedure: TOTAL HIP ARTHROPLASTY ANTERIOR APPROACH;  Surgeon: Paralee Cancel, MD;  Location: [facility];  Service: Orthopedics;  Laterality: Left;   Current Outpatient Medications on File Prior to Visit  Medication Sig Dispense Refill   albuterol (VENTOLIN HFA) 108 (90 Base) MCG/ACT inhaler Inhale 1-2 puffs into the lungs every 6 (six) hours as needed for wheezing or shortness of breath. 1 each 6   amLODipine (NORVASC) 5 MG tablet Take 1 tablet (5 mg total) by mouth at bedtime. 90 tablet 3   docusate sodium (COLACE) 100 MG capsule Take 1 capsule (100 mg total) by mouth 2 (two) times  daily. 10 capsule 0   furosemide (LASIX) 40 MG tablet Take 1 tablet (40 mg total) by mouth daily. 90 tablet 3   meclizine (ANTIVERT) 25 MG tablet TAKE ONE TABLET BY MOUTH THREE TIMES DAILY AS NEEDED FOR DIZZINESS (Patient taking differently: Take 25 mg by mouth 3 (three) times daily as needed for dizziness. TAKE ONE TABLET BY MOUTH THREE TIMES DAILY AS NEEDED FOR DIZZINESS) 90 tablet 2   methocarbamol (ROBAXIN) 500 MG tablet Take 1 tablet (500 mg total) by mouth every 6 (six) hours as needed for muscle spasms. 40 tablet 0   Multiple Vitamin (MULTIVITAMIN WITH MINERALS) TABS Take 1 tablet by mouth daily.     pantoprazole (PROTONIX) 40 MG tablet Take 1 tablet by mouth once daily 30 tablet 11   polyethylene glycol (MIRALAX / GLYCOLAX) 17 g packet Take 17 g by mouth daily as needed for mild constipation. 14 each 0   rivaroxaban (XARELTO) 10 MG TABS tablet Take 1 tablet (10 mg total) by mouth daily with breakfast for 14 days. Then take aspirin 81 mg twice daily for a month. 14 tablet 0   triamcinolone cream (KENALOG) 0.1 % Apply 1 application topically 2 (two) times daily  as needed (irritation).     umeclidinium-vilanterol (ANORO ELLIPTA) 62.5-25 MCG/INH AEPB Inhale 1 puff into the lungs daily. 1 each 11   valsartan (DIOVAN) 80 MG tablet Take 1 tablet (80 mg total) by mouth daily. 90 tablet 3   zolpidem (AMBIEN) 10 MG tablet Take 1 tablet (10 mg total) by mouth at bedtime as needed for sleep. 90 tablet 1   No current facility-administered medications on file prior to visit.   No Known Allergies Social History   Socioeconomic History   Marital status: Widowed    Spouse name: Not on file   Number of children: 2   Years of education: 4   Highest education level: Not on file  Occupational History   Occupation: Retired  Tobacco Use   Smoking status: Former    Years: 57.00    Types: Cigarettes    Quit date: 05/25/2007    Years since quitting: 13.7   Smokeless tobacco: Never  Vaping Use   Vaping  Use: Never used  Substance and Sexual Activity   Alcohol use: No    Alcohol/week: 0.0 standard drinks   Drug use: No   Sexual activity: Not on file  Other Topics Concern   Not on file  Social History Narrative   Widow    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  Constitutional:      General: He is not in acute distress.    Appearance: Normal appearance. He is normal weight. He is not ill-appearing or toxic-appearing.  Cardiovascular:     Rate and Rhythm: Normal rate and regular rhythm.     Heart sounds: Normal heart sounds.  Pulmonary:     Effort: Pulmonary effort is normal. No respiratory distress.     Breath sounds: Decreased air movement present. No stridor. Decreased breath sounds and wheezing present. No rhonchi or rales.  Chest:     Chest wall: No tenderness.  Musculoskeletal:     Right lower leg: Edema present.     Left lower leg: Edema present.  Skin:    Findings: No erythema or rash.  Neurological:     Mental Status: He is alert.          Assessment & Plan:    S/P total left hip arthroplasty  Chronic obstructive pulmonary disease, unspecified COPD type ([place])  History of DVT (deep vein thrombosis)  Primary hypertension Biggest concern is possibly a DVT.  I will arrange a venous ultrasound stat to rule out a DVT.  In the meanwhile encourage the patient to take 10 mg of Xarelto as prescribed.  Check a CBC and CMP to monitor for any drop in his hemoglobin or electrolyte disturbance.  If the patient does have a DVT obviously we will switch the patient to 50 mg  of Xarelto twice daily for 21 days and then 20 mg thereafter.

## 2021-03-06 ENCOUNTER — Telehealth: Payer: Self-pay | Admitting: Pharmacist

## 2021-03-06 ENCOUNTER — Telehealth: Payer: Self-pay | Admitting: *Deleted

## 2021-03-06 ENCOUNTER — Ambulatory Visit
Admission: RE | Admit: 2021-03-06 | Discharge: 2021-03-06 | Disposition: A | Discharge status: Home / Self Care | Payer: Medicare Other | Source: Ambulatory Visit | Attending: Family Medicine | Admitting: Family Medicine

## 2021-03-06 DIAGNOSIS — M7989 Other specified soft tissue disorders: Secondary | ICD-10-CM

## 2021-03-06 DIAGNOSIS — Z86718 Personal history of other venous thrombosis and embolism: Secondary | ICD-10-CM

## 2021-03-06 DIAGNOSIS — R600 Localized edema: Secondary | ICD-10-CM | POA: Diagnosis not present

## 2021-03-06 LAB — CBC WITH DIFFERENTIAL/PLATELET
Absolute Monocytes: 731 cells/uL (ref 200–950)
Basophils Absolute: 52 cells/uL (ref 0–200)
Basophils Relative: 0.6 %
Eosinophils Absolute: 249 cells/uL (ref 15–500)
Eosinophils Relative: 2.9 %
HCT: 37.7 % — ABNORMAL LOW (ref 38.5–50.0)
Hemoglobin: 12.6 g/dL — ABNORMAL LOW (ref 13.2–17.1)
Lymphs Abs: 1531 cells/uL (ref 850–3900)
MCH: 31 pg (ref 27.0–33.0)
MCHC: 33.4 g/dL (ref 32.0–36.0)
MCV: 92.6 fL (ref 80.0–100.0)
MPV: 10.1 fL (ref 7.5–12.5)
Monocytes Relative: 8.5 %
Neutro Abs: 6037 cells/uL (ref 1500–7800)
Neutrophils Relative %: 70.2 %
Platelets: 265 10*3/uL (ref 140–400)
RBC: 4.07 10*6/uL — ABNORMAL LOW (ref 4.20–5.80)
RDW: 14.1 % (ref 11.0–15.0)
Total Lymphocyte: 17.8 %
WBC: 8.6 10*3/uL (ref 3.8–10.8)

## 2021-03-06 LAB — COMPLETE METABOLIC PANEL WITH GFR
AG Ratio: 1.7 (calc) (ref 1.0–2.5)
ALT: 19 U/L (ref 9–46)
AST: 18 U/L (ref 10–35)
Albumin: 3.9 g/dL (ref 3.6–5.1)
Alkaline phosphatase (APISO): 62 U/L (ref 35–144)
BUN: 25 mg/dL (ref 7–25)
CO2: 25 mmol/L (ref 20–32)
Calcium: 8.7 mg/dL (ref 8.6–10.3)
Chloride: 103 mmol/L (ref 98–110)
Creat: 1.13 mg/dL (ref 0.70–1.22)
Globulin: 2.3 g/dL (calc) (ref 1.9–3.7)
Glucose, Bld: 105 mg/dL — ABNORMAL HIGH (ref 65–99)
Potassium: 4.3 mmol/L (ref 3.5–5.3)
Sodium: 138 mmol/L (ref 135–146)
Total Bilirubin: 0.5 mg/dL (ref 0.2–1.2)
Total Protein: 6.2 g/dL (ref 6.1–8.1)
eGFR: 65 mL/min/{1.73_m2} (ref >=60)

## 2021-03-06 NOTE — Telephone Encounter (Signed)
-----   Message from [name] sent at 03/06/2021 11:18 AM EDT ----- Regarding: Medication Concerns Patient stated he forgot to mention that his Ambien is not working for him he stated he forgot to mention to Dr. Dennard Schaumann if it was okay if he took either another tablet at night or maybe a half, please call the patient and let him know. Thanks.

## 2021-03-06 NOTE — Telephone Encounter (Signed)
Call placed to patient and patient made aware.   States that he does not want to change medication at this time.

## 2021-03-06 NOTE — Telephone Encounter (Signed)
Per PCP, patient is at max dose of Ambien.   We can D/C Ambien and try Trazodone 50mg .   Call placed to patient. [place].

## 2021-03-06 NOTE — Progress Notes (Signed)
Chronic Care Management Pharmacy Assistant   Name: Brendan Hopkins  MRN: [id number] DOB: Sep 26, 1935  Reason for Encounter: Disease State For COPD.   Conditions to be addressed/monitored: HTN, COPD, GERD, HLD, Insomnia  Recent office visits:  03/05/21 Dr. Dennard Schaumann For hospitalization follow-up. Per note: If the patient does have a DVT obviously we will switch the patient to 50 mg of Xarelto twice daily for 21 days and then 20 mg thereafter.  Recent consult visits:  None since 12/04/20  Hospital visits:  02/24/21 [facility] (32 Hours) Paralee Cancel, MD. Got left total hip arthroplasty. STOPPED Acetaminophen, Aspirin, Emu Oil, and Fish oil.   Medications: Outpatient Encounter Medications as of 03/06/2021  Medication Sig Note   albuterol (VENTOLIN HFA) 108 (90 Base) MCG/ACT inhaler Inhale 1-2 puffs into the lungs every 6 (six) hours as needed for wheezing or shortness of breath.    amLODipine (NORVASC) 5 MG tablet Take 1 tablet (5 mg total) by mouth at bedtime.    docusate sodium (COLACE) 100 MG capsule Take 1 capsule (100 mg total) by mouth 2 (two) times daily.    furosemide (LASIX) 40 MG tablet Take 1 tablet (40 mg total) by mouth daily.    meclizine (ANTIVERT) 25 MG tablet TAKE ONE TABLET BY MOUTH THREE TIMES DAILY AS NEEDED FOR DIZZINESS (Patient taking differently: Take 25 mg by mouth 3 (three) times daily as needed for dizziness. TAKE ONE TABLET BY MOUTH THREE TIMES DAILY AS NEEDED FOR DIZZINESS) 03/05/2021: PRN   methocarbamol (ROBAXIN) 500 MG tablet Take 1 tablet (500 mg total) by mouth every 6 (six) hours as needed for muscle spasms.    Multiple Vitamin (MULTIVITAMIN WITH MINERALS) TABS Take 1 tablet by mouth daily.    pantoprazole (PROTONIX) 40 MG tablet Take 1 tablet by mouth once daily    polyethylene glycol (MIRALAX / GLYCOLAX) 17 g packet Take 17 g by mouth daily as needed for mild constipation.    rivaroxaban (XARELTO) 10 MG TABS tablet Take 1 tablet  (10 mg total) by mouth daily with breakfast for 14 days. Then take aspirin 81 mg twice daily for a month.    triamcinolone cream (KENALOG) 0.1 % Apply 1 application topically 2 (two) times daily as needed (irritation).    umeclidinium-vilanterol (ANORO ELLIPTA) 62.5-25 MCG/INH AEPB Inhale 1 puff into the lungs daily.    valsartan (DIOVAN) 80 MG tablet Take 1 tablet (80 mg total) by mouth daily.    zolpidem (AMBIEN) 10 MG tablet Take 1 tablet (10 mg total) by mouth at bedtime as needed for sleep.    No facility-administered encounter medications on file as of 03/06/2021.   Current COPD regimen:  Ventolin HFA 90 mcg prn Anoro Ellipta daily - Inhale 1 puff into the lungs daily  No flowsheet data found.  Any recent hospitalizations or ED visits since last visit with CPP? Yes, documented above.  Reports COPD symptoms, including Symptoms worse with exercise  What recent interventions/DTPs have been made by any provider to improve breathing since last visit: None.   Have you had exacerbation/flare-up since last visit? Patient stated no.   What do you do when you are short of breath?  Adhere to COPD Action Plan, Rescue medication, and Rest  Respiratory Devices/Equipment Do you have a nebulizer? Patient stated No  Do you use a Peak Flow Meter? Patient stated No  Do you use a maintenance inhaler? Patient stated Yes  How often do you forget to use your daily  inhaler? Patient stated no.   Do you use a rescue inhaler? Patient stated Yes  How often do you use your rescue inhaler?  Patient stated as needed, he stated daily  Do you use a spacer with your inhaler? Patient stated No  Adherence Review: Does the patient have >5 day gap between last estimated fill date for maintenance inhaler medications? No   Care Gaps: Patient is due for Colonoscopy. Patient is due for his AWV. Scheduled it for 03/26/21 at 11:15 am.   Star Rating Drugs:Valsartan 80 mg 90 DS 01/09/21 30  DS.  Follow-Up:Pharmacist Review  Charlann Lange, [place] Pharmacist Assistant [phone]

## 2021-03-18 DIAGNOSIS — M25511 Pain in right shoulder: Secondary | ICD-10-CM | POA: Diagnosis not present

## 2021-03-26 ENCOUNTER — Ambulatory Visit (INDEPENDENT_AMBULATORY_CARE_PROVIDER_SITE_OTHER): Payer: Medicare Other

## 2021-03-26 ENCOUNTER — Other Ambulatory Visit: Payer: Self-pay

## 2021-03-26 ENCOUNTER — Telehealth: Payer: Self-pay

## 2021-03-26 VITALS — Ht 70.5 in | Wt 254.0 lb

## 2021-03-26 DIAGNOSIS — Z8781 Personal history of (healed) traumatic fracture: Secondary | ICD-10-CM | POA: Insufficient documentation

## 2021-03-26 DIAGNOSIS — Z0000 Encounter for general adult medical examination without abnormal findings: Secondary | ICD-10-CM

## 2021-03-26 NOTE — Patient Instructions (Signed)
Brendan Hopkins , Thank you for taking time to come for your Medicare Wellness Visit. I appreciate your ongoing commitment to your health goals. Please review the following plan we discussed and let me know if I can assist you in the future.   Screening recommendations/referrals: Colonoscopy: No longer required due to age Recommended yearly ophthalmology/optometry visit for glaucoma screening and checkup Recommended yearly dental visit for hygiene and checkup  Vaccinations: Influenza vaccine: 02/10/2021 Repeat annually  Pneumococcal vaccine: 04/09/2013 and 12/19/2014 Tdap vaccine: 09/10/2003 Repeat in 10 years  Shingles vaccine: 11/07/2020   Covid-19: 07/27/2019, 08/27/2019, 03/17/2020 and 11/07/2020.  Advanced directives: Please bring a copy of your health care power of attorney and living will to the office to be added to your chart at your convenience.   Conditions/risks identified: Aim for 30 minutes of exercise or walking each day, drink 6-8 glasses of water and eat lots of fruits and vegetables. KEEP UP THE GOOD WORK!!!  Next appointment: Follow up in one year for your annual wellness visit. 2023.  Preventive Care 72 Years and Older, Male  Preventive care refers to lifestyle choices and visits with your health care provider that can promote health and wellness. What does preventive care include? A yearly physical exam. This is also called an annual well check. Dental exams once or twice a year. Routine eye exams. Ask your health care provider how often you should have your eyes checked. Personal lifestyle choices, including: Daily care of your teeth and gums. Regular physical activity. Eating a healthy diet. Avoiding tobacco and drug use. Limiting alcohol use. Practicing safe sex. Taking low doses of aspirin every day. Taking vitamin and mineral supplements as recommended by your health care provider. What happens during an annual well check? The services and screenings done by  your health care provider during your annual well check will depend on your age, overall health, lifestyle risk factors, and family history of disease. Counseling  Your health care provider may ask you questions about your: Alcohol use. Tobacco use. Drug use. Emotional well-being. Home and relationship well-being. Sexual activity. Eating habits. History of falls. Memory and ability to understand (cognition). Work and work Statistician. Screening  You may have the following tests or measurements: Height, weight, and BMI. Blood pressure. Lipid and cholesterol levels. These may be checked every 5 years, or more frequently if you are over 46 years old. Skin check. Lung cancer screening. You may have this screening every year starting at age 73 if you have a 30-pack-year history of smoking and currently smoke or have quit within the past 15 years. Fecal occult blood test (FOBT) of the stool. You may have this test every year starting at age 31. Flexible sigmoidoscopy or colonoscopy. You may have a sigmoidoscopy every 5 years or a colonoscopy every 10 years starting at age 77. Prostate cancer screening. Recommendations will vary depending on your family history and other risks. Hepatitis C blood test. Hepatitis B blood test. Sexually transmitted disease (STD) testing. Diabetes screening. This is done by checking your blood sugar (glucose) after you have not eaten for a while (fasting). You may have this done every 1-3 years. Abdominal aortic aneurysm (AAA) screening. You may need this if you are a current or former smoker. Osteoporosis. You may be screened starting at age 13 if you are at high risk. Talk with your health care provider about your test results, treatment options, and if necessary, the need for more tests. Vaccines  Your health care provider may recommend  certain vaccines, such as: Influenza vaccine. This is recommended every year. Tetanus, diphtheria, and acellular pertussis  (Tdap, Td) vaccine. You may need a Td booster every 10 years. Zoster vaccine. You may need this after age 19. Pneumococcal 13-valent conjugate (PCV13) vaccine. One dose is recommended after age 51. Pneumococcal polysaccharide (PPSV23) vaccine. One dose is recommended after age 9. Talk to your health care provider about which screenings and vaccines you need and how often you need them. This information is not intended to replace advice given to you by your health care provider. Make sure you discuss any questions you have with your health care provider. Document Released: 06/06/2015 Document Revised: 01/28/2016 Document Reviewed: 03/11/2015 Elsevier Interactive Patient Education  2017 [employer] Prevention in the Home Falls can cause injuries. They can happen to people of all ages. There are many things you can do to make your home safe and to help prevent falls. What can I do on the outside of my home? Regularly fix the edges of walkways and driveways and fix any cracks. Remove anything that might make you trip as you walk through a door, such as a raised step or threshold. Trim any bushes or trees on the path to your home. Use bright outdoor lighting. Clear any walking paths of anything that might make someone trip, such as rocks or tools. Regularly check to see if handrails are loose or broken. Make sure that both sides of any steps have handrails. Any raised decks and porches should have guardrails on the edges. Have any leaves, snow, or ice cleared regularly. Use sand or salt on walking paths during winter. Clean up any spills in your garage right away. This includes oil or grease spills. What can I do in the bathroom? Use night lights. Install grab bars by the toilet and in the tub and shower. Do not use towel bars as grab bars. Use non-skid mats or decals in the tub or shower. If you need to sit down in the shower, use a plastic, non-slip stool. Keep the floor dry. Clean  up any water that spills on the floor as soon as it happens. Remove soap buildup in the tub or shower regularly. Attach bath mats securely with double-sided non-slip rug tape. Do not have throw rugs and other things on the floor that can make you trip. What can I do in the bedroom? Use night lights. Make sure that you have a light by your bed that is easy to reach. Do not use any sheets or blankets that are too big for your bed. They should not hang down onto the floor. Have a firm chair that has side arms. You can use this for support while you get dressed. Do not have throw rugs and other things on the floor that can make you trip. What can I do in the kitchen? Clean up any spills right away. Avoid walking on wet floors. Keep items that you use a lot in easy-to-reach places. If you need to reach something above you, use a strong step stool that has a grab bar. Keep electrical cords out of the way. Do not use floor polish or wax that makes floors slippery. If you must use wax, use non-skid floor wax. Do not have throw rugs and other things on the floor that can make you trip. What can I do with my stairs? Do not leave any items on the stairs. Make sure that there are handrails on both sides of the  stairs and use them. Fix handrails that are broken or loose. Make sure that handrails are as long as the stairways. Check any carpeting to make sure that it is firmly attached to the stairs. Fix any carpet that is loose or worn. Avoid having throw rugs at the top or bottom of the stairs. If you do have throw rugs, attach them to the floor with carpet tape. Make sure that you have a light switch at the top of the stairs and the bottom of the stairs. If you do not have them, ask someone to add them for you. What else can I do to help prevent falls? Wear shoes that: Do not have high heels. Have rubber bottoms. Are comfortable and fit you well. Are closed at the toe. Do not wear sandals. If you  use a stepladder: Make sure that it is fully opened. Do not climb a closed stepladder. Make sure that both sides of the stepladder are locked into place. Ask someone to hold it for you, if possible. Clearly mark and make sure that you can see: Any grab bars or handrails. First and last steps. Where the edge of each step is. Use tools that help you move around (mobility aids) if they are needed. These include: Canes. Walkers. Scooters. Crutches. Turn on the lights when you go into a dark area. Replace any light bulbs as soon as they burn out. Set up your furniture so you have a clear path. Avoid moving your furniture around. If any of your floors are uneven, fix them. If there are any pets around you, be aware of where they are. Review your medicines with your doctor. Some medicines can make you feel dizzy. This can increase your chance of falling. Ask your doctor what other things that you can do to help prevent falls. This information is not intended to replace advice given to you by your health care provider. Make sure you discuss any questions you have with your health care provider. Document Released: 03/06/2009 Document Revised: 10/16/2015 Document Reviewed: 06/14/2014 Elsevier Interactive Patient Education  2017 [employer].

## 2021-03-26 NOTE — Telephone Encounter (Signed)
Called pt for AWV. S/P left hip replacement 4 weeks ago. Pt asks if he can get handicap placards for his 2 vehicles due to hip surgery and hx of COPD? Thanks.

## 2021-03-26 NOTE — Progress Notes (Signed)
Subjective:   Brendan Hopkins is a 85 y.o. male who presents for Medicare Annual/Subsequent preventive examination. Virtual Visit via Telephone Note  I connected with  Brendan Hopkins on 03/26/21 at 11:15 AM EDT by telephone and verified that I am speaking with the correct person using two identifiers.  Location: Patient: Home  Provider: [facility] Persons participating in the virtual visit: patient/Nurse Health Advisor   I discussed the limitations, risks, security and privacy concerns of performing an evaluation and management service by telephone and the availability of in person appointments. The patient expressed understanding and agreed to proceed.  Interactive audio and video telecommunications were attempted between this nurse and patient, however failed, due to patient having technical difficulties OR patient did not have access to video capability.  We continued and completed visit with audio only.  Some vital signs may be absent or patient reported.   Chriss Driver, LPN  Review of Systems     Cardiac Risk Factors include: advanced age (>69men, >33 women);hypertension;male gender;sedentary lifestyle;obesity (BMI >30kg/m2);Other (see comment), Risk factor comments: s/p hip replacement 4 weeks ago.     Objective:    Today's Vitals   03/26/21 1156  Weight: 254 lb (115.2 kg)  Height: 5' 10.5" (1.791 m)   Body mass index is 35.93 kg/m.  Advanced Directives 03/26/2021 02/24/2021 02/12/2021 12/06/2019 11/18/2019 07/11/2019 07/11/2019  Does Patient Have a Medical Advance Directive? No Yes Yes No No No No  Type of Advance Directive - [place];Living will [place];Living will - - - -  Does patient want to make changes to medical advance directive? - No - Patient declined - - - - -  Copy of [place] in Chart? - No - copy requested - - - - -  Would patient like information on creating a medical advance directive? No -  Patient declined - - - No - Patient declined No - Patient declined -  Pre-existing out of facility DNR order (yellow form or pink MOST form) - - - - - - -    Current Medications (verified) Outpatient Encounter Medications as of 03/26/2021  Medication Sig   albuterol (VENTOLIN HFA) 108 (90 Base) MCG/ACT inhaler Inhale 1-2 puffs into the lungs every 6 (six) hours as needed for wheezing or shortness of breath.   amLODipine (NORVASC) 5 MG tablet Take 1 tablet (5 mg total) by mouth at bedtime.   docusate sodium (COLACE) 100 MG capsule Take 1 capsule (100 mg total) by mouth 2 (two) times daily.   furosemide (LASIX) 40 MG tablet Take 1 tablet (40 mg total) by mouth daily.   HYDROcodone-acetaminophen (NORCO/VICODIN) 5-325 MG tablet Take 1 tablet by mouth every 4 (four) hours as needed.   meclizine (ANTIVERT) 25 MG tablet TAKE ONE TABLET BY MOUTH THREE TIMES DAILY AS NEEDED FOR DIZZINESS (Patient taking differently: Take 25 mg by mouth 3 (three) times daily as needed for dizziness. TAKE ONE TABLET BY MOUTH THREE TIMES DAILY AS NEEDED FOR DIZZINESS)   methocarbamol (ROBAXIN) 500 MG tablet Take 1 tablet (500 mg total) by mouth every 6 (six) hours as needed for muscle spasms.   Multiple Vitamin (MULTIVITAMIN WITH MINERALS) TABS Take 1 tablet by mouth daily.   pantoprazole (PROTONIX) 40 MG tablet Take 1 tablet by mouth once daily   polyethylene glycol powder (GLYCOLAX/MIRALAX) 17 GM/SCOOP powder polyethylene glycol 3350 17 gram oral powder packet  MIX 17 GM IN 2-4 OUNCES OF LIQUID AND DRINK  BY MOUTH DAILY AS NEEDED FOR MILD CONSTIPATION.   triamcinolone cream (KENALOG) 0.1 % Apply 1 application topically 2 (two) times daily as needed (irritation).   umeclidinium-vilanterol (ANORO ELLIPTA) 62.5-25 MCG/INH AEPB Inhale 1 puff into the lungs daily.   valsartan (DIOVAN) 80 MG tablet Take 1 tablet (80 mg total) by mouth daily.   zolpidem (AMBIEN) 10 MG tablet Take 1 tablet (10 mg total) by mouth at bedtime as  needed for sleep.   rivaroxaban (XARELTO) 10 MG TABS tablet Take 1 tablet (10 mg total) by mouth daily with breakfast for 14 days. Then take aspirin 81 mg twice daily for a month.   [DISCONTINUED] acetaminophen (TYLENOL) 650 MG CR tablet acetaminophen ER 650 mg tablet,extended release   650 mg by oral route.   [DISCONTINUED] polyethylene glycol (MIRALAX / GLYCOLAX) 17 g packet Take 17 g by mouth daily as needed for mild constipation.   No facility-administered encounter medications on file as of 03/26/2021.    Allergies (verified) Patient has no known allergies.   History: Past Medical History:  Diagnosis Date   Acute deep vein thrombosis (DVT) of tibial vein of right lower extremity (HCC)    after ankle fracture/surgery (2/21)   Arthritis    RIGHT HIP, HANDS, BACK   Blood clot in vein    right lower leg   Cancer (HCC)    Skin cancer    Cataract    Cervical radiculopathy    severe C3-4 neuroforaminal stenosis, C5-6 left synovial cyst compressing left nerve root.   Colon polyps    2005   COPD (chronic obstructive pulmonary disease) ([place])    SMOKED FOR 23 YRS   Diverticulosis    2005   Fatty liver    GERD (gastroesophageal reflux disease)    Hard of hearing    History of kidney stones    Hypertension    Leg fracture, right    Pain    RIGHT HIP AND BACK   Personal history of colonic polyps-adenoma and sessile serrated polyp 07/15/2010   Pneumonia 03/2019   RBBB    POSS HX AF IN PAST- PAC'S   Shortness of breath    WITH EXERTION ONLY   Stroke ([place])    TIA   TIA (transient ischemic attack)    Wears glasses    Past Surgical History:  Procedure Laterality Date   COLONOSCOPY     maybe 3    COLONOSCOPY W/ POLYPECTOMY     CYSTOSCOPY WITH RETROGRADE PYELOGRAM, URETEROSCOPY AND STENT PLACEMENT Left 12/11/2019   Procedure: CYSTOSCOPY WITH LEFT RETROGRADE LEFT  URETEROSCOPY WITH HOLMIUM LASER AND STENT PLACEMENT;  Surgeon: Irine Seal, MD;  Location: [facility];  Service:  Urology;  Laterality: Left;   ORIF ANKLE FRACTURE Right 07/11/2019   ORIF ANKLE FRACTURE Right 07/11/2019   Procedure: OPEN REDUCTION INTERNAL FIXATION (ORIF) ANKLE FRACTURE;  Surgeon: Shona Needles, MD;  Location: [facility];  Service: Orthopedics;  Laterality: Right;   skin cancers removed      TOTAL HIP ARTHROPLASTY Right 01/09/2013   Procedure: RIGHT TOTAL HIP ARTHROPLASTY ANTERIOR APPROACH;  Surgeon: [name], MD;  Location: [facility];  Service: Orthopedics;  Laterality: Right;   TOTAL HIP ARTHROPLASTY Left 02/24/2021   Procedure: TOTAL HIP ARTHROPLASTY ANTERIOR APPROACH;  Surgeon: Paralee Cancel, MD;  Location: [facility];  Service: Orthopedics;  Laterality: Left;   Family History  Problem Relation Age of Onset   Cancer Father    Cancer Mother  died more of old age at age 23   Colon cancer Neg Hx    Esophageal cancer Neg Hx    Prostate cancer Neg Hx    Rectal cancer Neg Hx    Stomach cancer Neg Hx    Social History   Socioeconomic History   Marital status: Widowed    Spouse name: Not on file   Number of children: 2   Years of education: 86   Highest education level: Not on file  Occupational History   Occupation: Retired  Tobacco Use   Smoking status: Former    Years: 57.00    Types: Cigarettes    Quit date: 05/25/2007    Years since quitting: 13.8   Smokeless tobacco: Never   Tobacco comments:    Does not qualify due to age.   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   Social Determinants of Radio broadcast assistant Strain: Low Risk    Difficulty of Paying Living Expenses: Not hard at all  Food Insecurity: No Food Insecurity   Worried About Charity fundraiser in the Last Year: Never true   Academic librarian in the Last Year: Never true  Transportation Needs: No Transportation Needs   Lack of Transportation (Medical): No   Lack of Transportation (Non-Medical): No  Physical Activity: Insufficiently Active   Days of Exercise per Week: 3 days   Minutes of Exercise per Session: 30 min  Stress: No Stress Concern Present   Feeling of Stress : Not at all  Social Connections: Moderately Integrated   Frequency of Communication with Friends and Family: More than three times a week   Frequency of Social Gatherings with Friends and Family: More than three times a week   Attends Religious Services: More than 4 times per year   Active Member of [employer] or Organizations: Yes   Attends Archivist Meetings: More than 4 times per year   Marital Status: Widowed    Tobacco Counseling Counseling given: Not Answered Tobacco comments: Does not qualify due to age.    Clinical Intake:  Pre-visit preparation completed: Yes  Pain : No/denies pain (At time with walking, s/p hip surgery 4 weeks ago.)     BMI - recorded: 35.93 Nutritional Status: BMI > 30  Obese Nutritional Risks: None Diabetes: No  How often do you need to have someone help you when you read instructions, pamphlets, or other written materials from your doctor or pharmacy?: 1 - Never  Diabetic?no      Information entered by :: MJ Zoie Sarin, LPN   Activities of Daily Living In your present state of health, do you have any difficulty performing the following activities: 03/26/2021 02/24/2021  Hearing? Y Y  Comment - -  Vision? N N  Difficulty concentrating or making decisions? Y N  Walking or climbing stairs? N Y  Comment - -  Dressing or bathing? N N  Doing errands, shopping? N N  Preparing Food and eating ? N -  Using the Toilet? N -  In the past six months, have you accidently leaked urine? Y -  Do you have problems with loss of  bowel control? N -  Managing your Medications? N -  Managing your Finances? N -   Housekeeping or managing your Housekeeping? N -  Some recent data might be hidden    Patient Care Team: [name], MD as PCP - General (Family Medicine) Edythe Clarity, [facility] as Pharmacist (Pharmacist)  Indicate any recent Medical Services you may have received from other than [facility] providers in the past year (date may be approximate).     Assessment:   This is a routine wellness examination for [place].  Hearing/Vision screen Hearing Screening - Comments:: Hard of hearing, wears hearing aids.  Vision Screening - Comments:: Glasses. Dr Michail Jewels in [place]. 03/2021.  Dietary issues and exercise activities discussed: Current Exercise Habits: Structured exercise class, Type of exercise: walking;Other - see comments, Time (Minutes): 30, Frequency (Times/Week): 3, Weekly Exercise (Minutes/Week): 90, Intensity: Mild, Exercise limited by: cardiac condition(s);orthopedic condition(s);respiratory conditions(s)   Goals Addressed             This Visit's Progress    Track and Manage My Symptoms-COPD   On track    Timeframe:  Long-Range Goal Priority:  High Start Date:  09/22/20                           Expected End Date:  03/25/21                     Follow Up Date 01/21/21   - eliminate symptom triggers at home - follow rescue plan if symptoms flare-up - keep follow-up appointments    Why is this important?   Tracking your symptoms and other information about your health helps your doctor plan your care.  Write down the symptoms, the time of day, what you were doing and what medicine you are taking.  You will soon learn how to manage your symptoms.     Notes:        Depression Screen PHQ 2/9 Scores 03/26/2021 03/11/2020 02/26/2020 01/25/2019 01/25/2019 12/20/2017 12/20/2017  PHQ - 2 Score 0 0 0 0 0 0 0  PHQ- 9 Score - - - - 0 - -    Fall Risk Fall Risk  03/26/2021 03/11/2020 02/26/2020 01/25/2019 01/25/2019  Falls in the past year? 0 0 1 0 0  Number falls in past yr: 0 0  0 0 0  Injury with Fall? 0 0 1 0 0  Risk for fall due to : Impaired balance/gait;Impaired mobility - - - -  Follow up Falls prevention discussed Falls evaluation completed - - Falls evaluation completed    FALL RISK PREVENTION PERTAINING TO THE HOME:  Any stairs in or around the home? No  If so, are there any without handrails? No  Home free of loose throw rugs in walkways, pet beds, electrical cords, etc? Yes  Adequate lighting in your home to reduce risk of falls? Yes   ASSISTIVE DEVICES UTILIZED TO PREVENT FALLS:  Life alert? Yes  Use of a cane, walker or w/c? Yes  Grab bars in the bathroom? Yes  Shower chair or bench in shower? Yes  Elevated toilet seat or a handicapped toilet? Yes   TIMED UP AND GO:  Was the test performed? No . Phone visit.  Cognitive Function:     6CIT Screen 03/26/2021  What Year? 0 points  What month? 0 points  What time? 0 points  Count back from 20 0 points  Months in reverse 0  points  Repeat phrase 0 points  Total Score 0    Immunizations Immunization History  Administered Date(s) Administered   Fluad Quad(high Dose 65+) 01/25/2019, 02/21/2020, 02/10/2021   Influenza Split 02/15/2011   Influenza, High Dose Seasonal PF 02/17/2018   Influenza,inj,Quad PF,6+ Mos 04/09/2013, 02/12/2014, 03/14/2015, 03/04/2016, 03/02/2017   PFIZER(Purple Top)SARS-COV-2 Vaccination 08/16/2019, 09/06/2019, 03/17/2020, 11/07/2020   Pneumococcal Conjugate-13 04/09/2013   Pneumococcal Polysaccharide-23 12/19/2014   Td 09/10/2003   Zoster Recombinat (Shingrix) 11/07/2020    TDAP status: Due, Education has been provided regarding the importance of this vaccine. Advised may receive this vaccine at local pharmacy or Health Dept. Aware to provide a copy of the vaccination record if obtained from local pharmacy or Health Dept. Verbalized acceptance and understanding.  Flu Vaccine status: Up to date  Pneumococcal vaccine status: Up to date  Covid-19 vaccine status:  Completed vaccines  Qualifies for Shingles Vaccine? Yes   Zostavax completed Yes   Shingrix Completed?: No.    Education has been provided regarding the importance of this vaccine. Patient has been advised to call insurance company to determine out of pocket expense if they have not yet received this vaccine. Advised may also receive vaccine at local pharmacy or Health Dept. Verbalized acceptance and understanding.  Screening Tests Health Maintenance  Topic Date Due   COLONOSCOPY (Pts 45-39yrs Insurance coverage will need to be confirmed)  12/28/2018   COVID-19 Vaccine (5 - Booster for Pfizer series) 01/02/2021   Zoster Vaccines- Shingrix (2 of 2) 01/02/2021   TETANUS/TDAP  04/29/2021 (Originally 09/09/2013)   Pneumonia Vaccine 46+ Years old  Completed   INFLUENZA VACCINE  Completed   HPV VACCINES  Aged Out    Health Maintenance  Health Maintenance Due  Topic Date Due   COLONOSCOPY (Pts 45-36yrs Insurance coverage will need to be confirmed)  12/28/2018   COVID-19 Vaccine (5 - Booster for [place] series) 01/02/2021   Zoster Vaccines- Shingrix (2 of 2) 01/02/2021    Colorectal cancer screening: No longer required.   Lung Cancer Screening: (Low Dose CT Chest recommended if Age 38-80 years, 30 pack-year currently smoking OR have quit w/in 15years.) does not qualify.    Additional Screening:  Hepatitis C Screening: does not qualify.  Vision Screening: Recommended annual ophthalmology exams for early detection of glaucoma and other disorders of the eye. Is the patient up to date with their annual eye exam?  Yes  Who is the provider or what is the name of the office in which the patient attends annual eye exams? Dr. Michail Jewels in [place]. If pt is not established with a provider, would they like to be referred to a provider to establish care? No .   Dental Screening: Recommended annual dental exams for proper oral hygiene  Community Resource Referral / Chronic Care Management: CRR  required this visit?  No   CCM required this visit?  No      Plan:     I have personally reviewed and noted the following in the patient's chart:   Medical and social history Use of alcohol, tobacco or illicit drugs  Current medications and supplements including opioid prescriptions. Patient is currently taking opioid prescriptions. Information provided to patient regarding non-opioid alternatives. Patient advised to discuss non-opioid treatment plan with their provider. Functional ability and status Nutritional status Physical activity Advanced directives List of other physicians Hospitalizations, surgeries, and ER visits in previous 12 months Vitals Screenings to include cognitive, depression, and falls Referrals and appointments  In addition, I have  reviewed and discussed with patient certain preventive protocols, quality metrics, and best practice recommendations. A written personalized care plan for preventive services as well as general preventive health recommendations were provided to patient.     Chriss Driver, LPN   10/30/9338   Nurse Notes: Pt states he is doing well, s/p hip replacement surgery 4 weeks ago. Up to date on health maintenance and vaccines.

## 2021-04-04 IMAGING — DX DG ANKLE 2V *R*
2 series · 2 of 2 positions shown · non-contrast
Comparison: Earlier same day

CLINICAL DATA: Fracture.  Post reduction.

EXAM:
RIGHT ANKLE - 2 VIEW

[ankle ap]
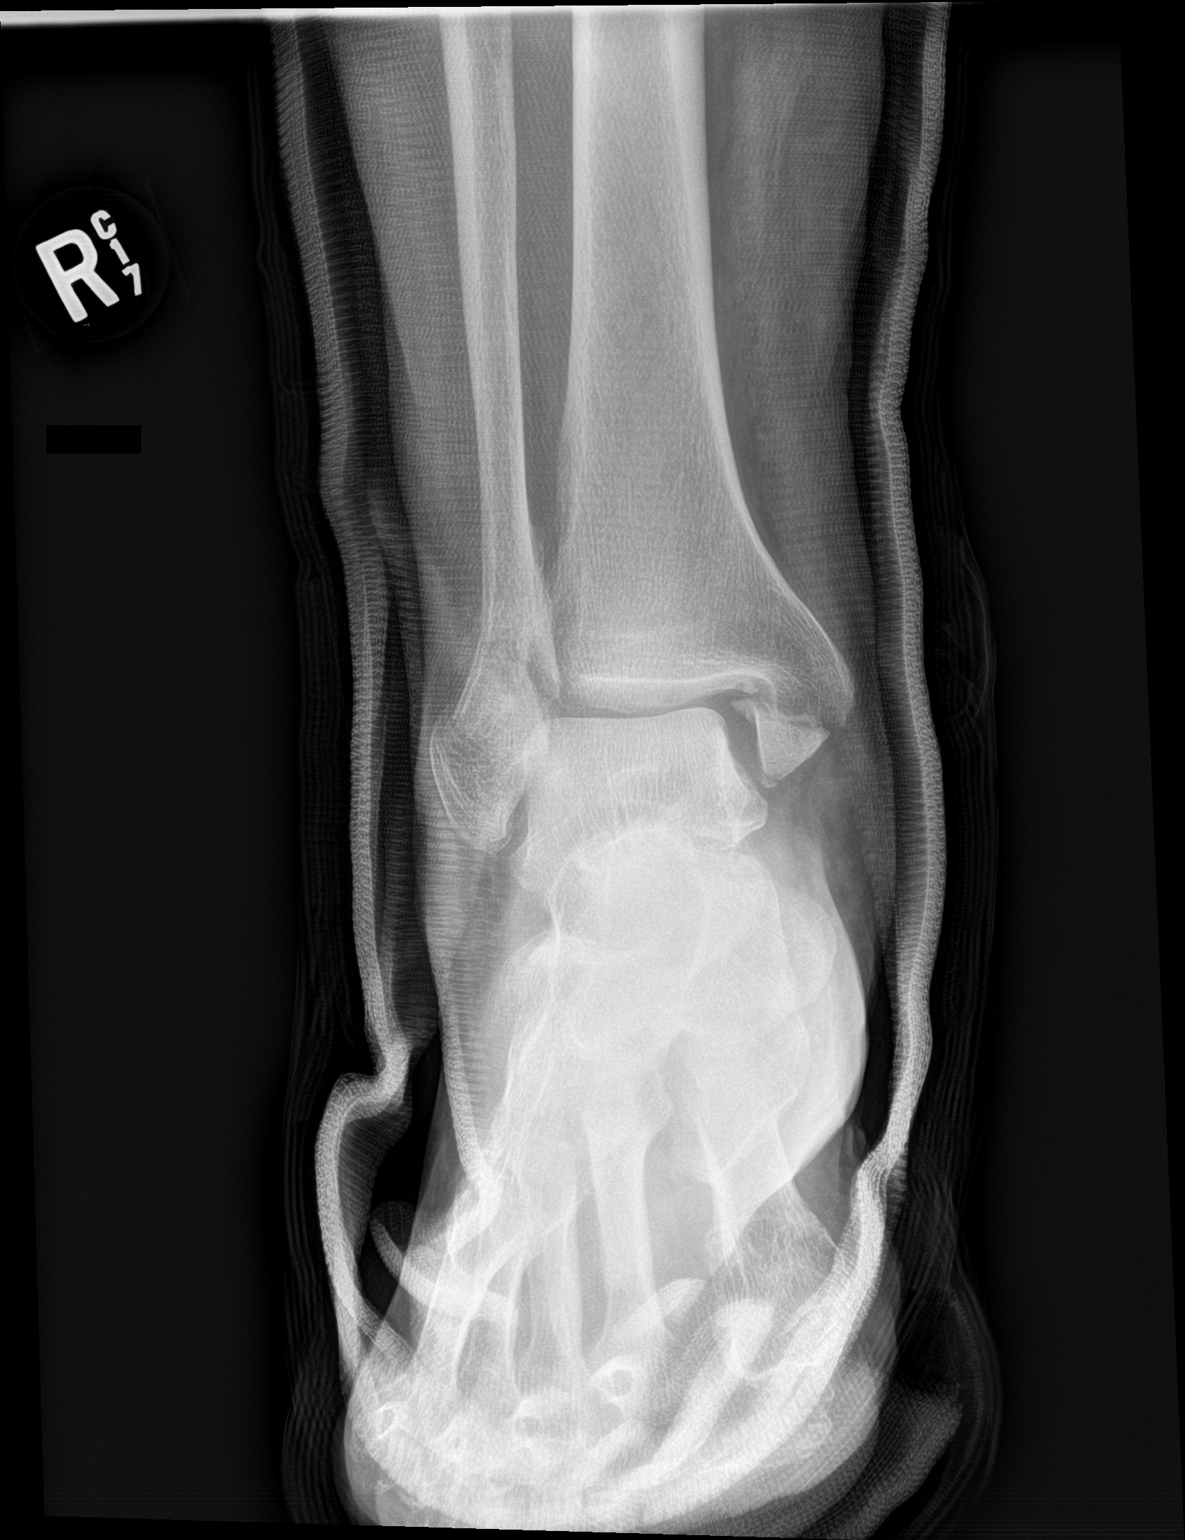

[ankle lat]
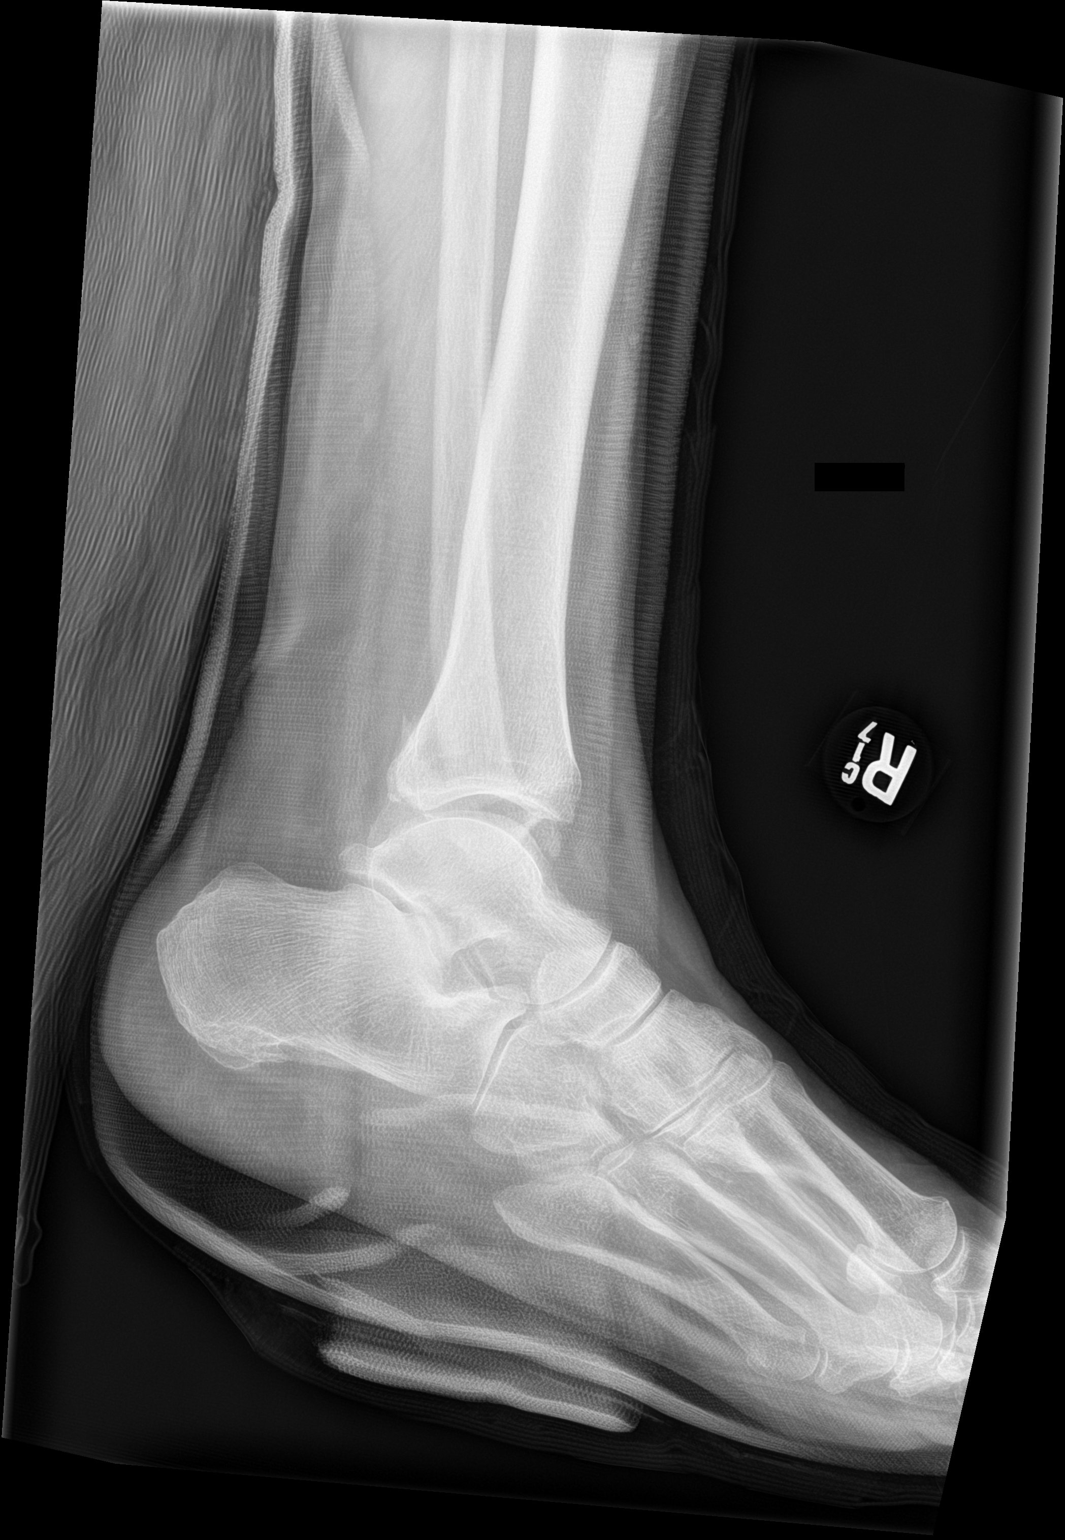

[2 of 2 positions shown; findings below may reference images not displayed]

FINDINGS: Two views study has fine bony detail obscured by overlying
fiberglass cast. The tibiotalar dislocation has been reduced in the
interval with comminuted fractures of the medial malleolus and
distal fibula again noted. No definite posterior lip fracture noted
distal tibia.
IMPRESSION: Improved alignment status post closed reduction.

## 2021-04-14 IMAGING — DX DG ANKLE PORT 2V*R*
3 series · 3 of 3 positions shown · non-contrast
Comparison: None.

CLINICAL DATA: Postop right ankle fracture repair

EXAM:
PORTABLE RIGHT ANKLE - 2 VIEW

[ankle ap]
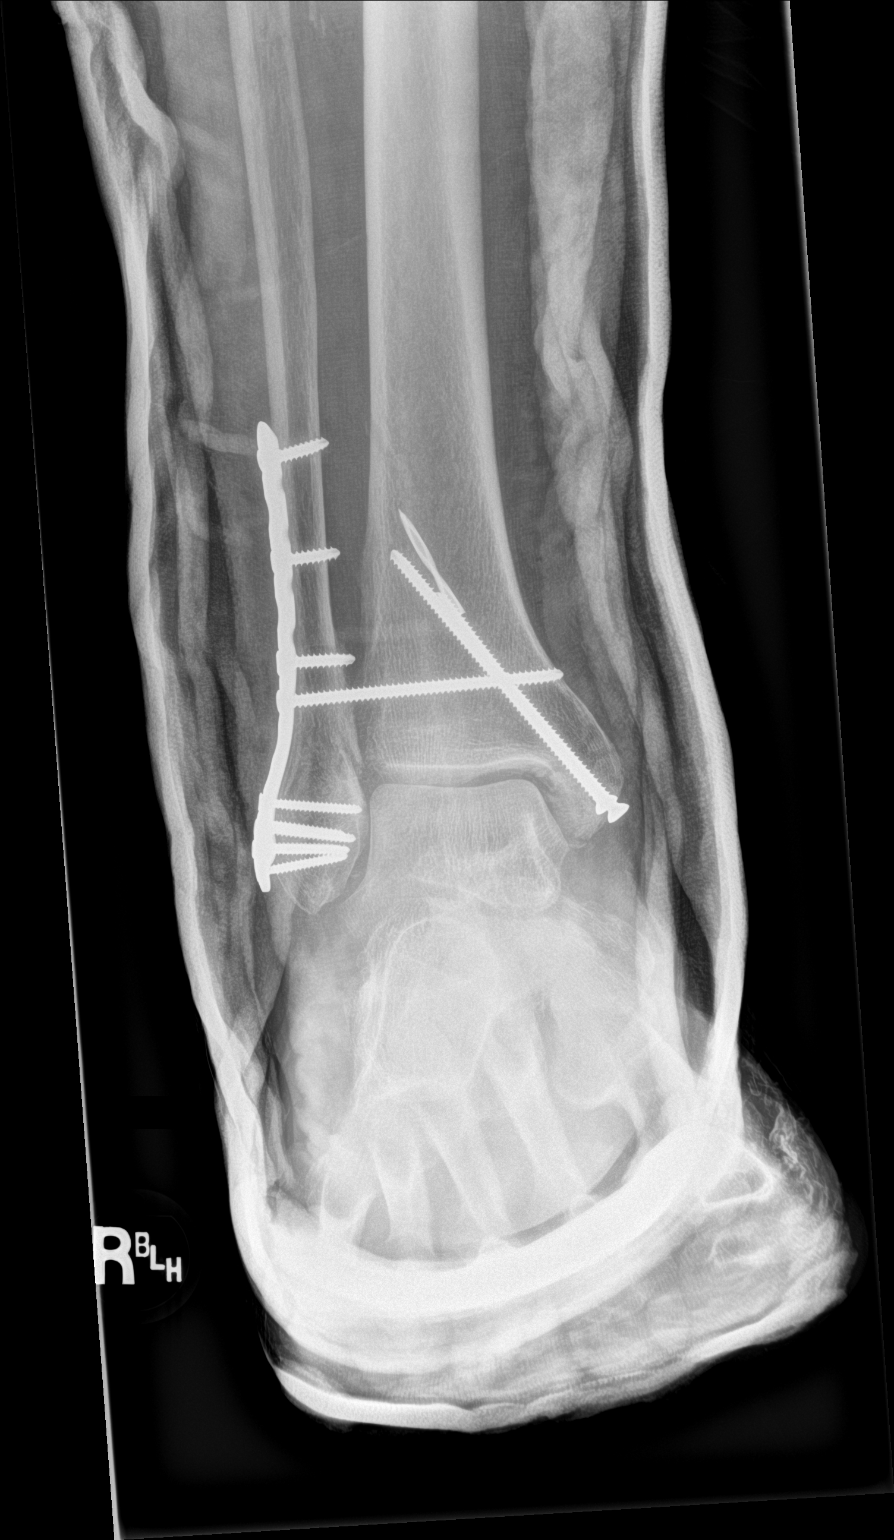

[ankle obl]
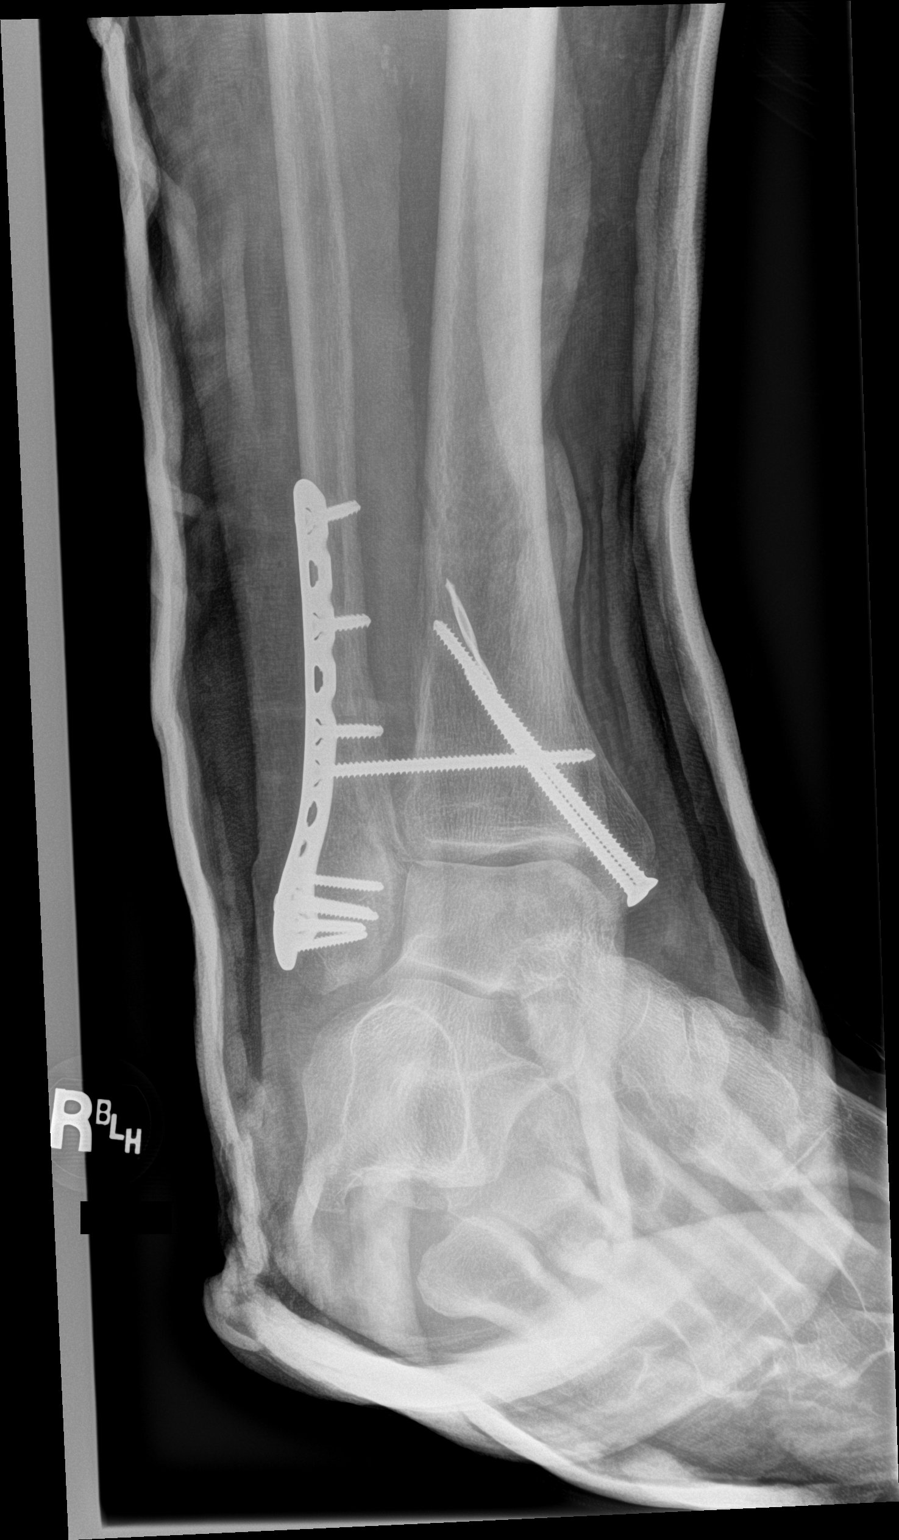

[ankle lat]
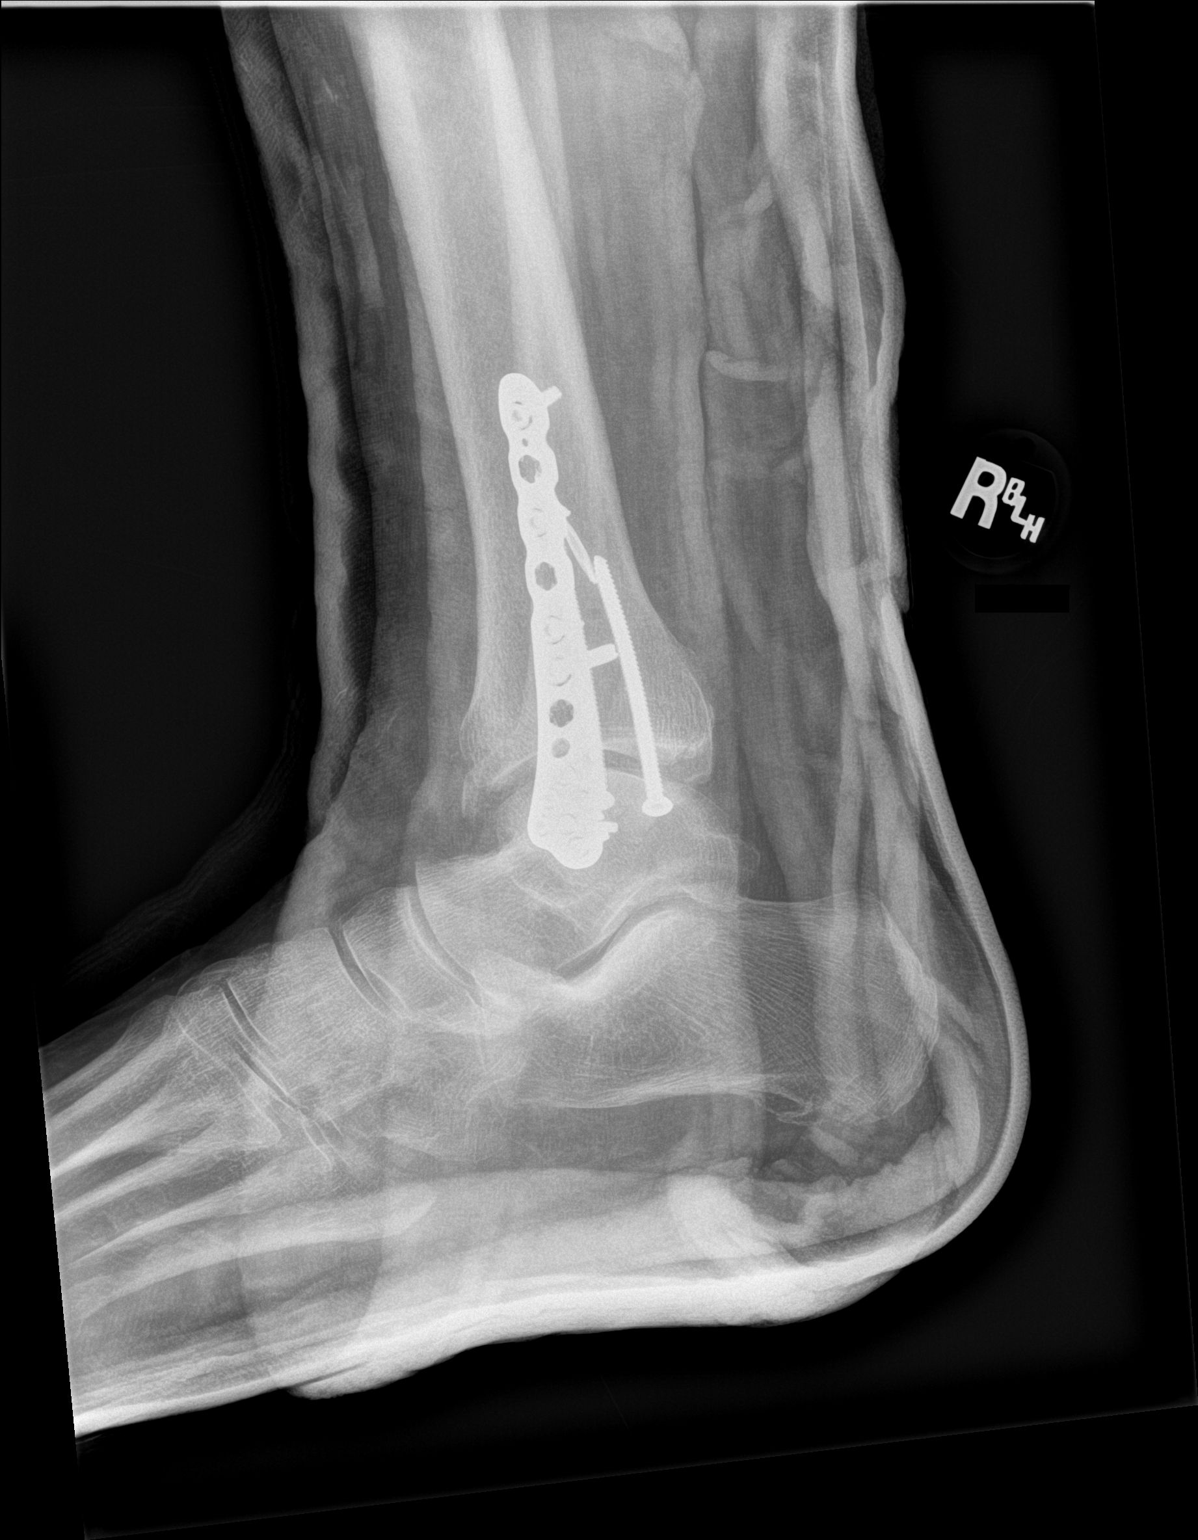

[3 of 3 positions shown; findings below may reference images not displayed]

FINDINGS: Distal fibular fracture transfixed with a lateral sideplate and
interlocking screws. Medial malleolar fracture transfixed with 2
cannulated screws. Single syndesmotic screw in satisfactory
position. No hardware failure or complication. Nondisplaced
posterior distal tibial malleolar fracture.

Small plantar calcaneal spur.  Ankle mortise is intact.
IMPRESSION: Interval ORIF of the medial malleolus and lateral malleolus.
Nondisplaced posterior distal tibial fracture.

## 2021-04-21 DIAGNOSIS — H35373 Puckering of macula, bilateral: Secondary | ICD-10-CM | POA: Diagnosis not present

## 2021-04-21 DIAGNOSIS — H524 Presbyopia: Secondary | ICD-10-CM | POA: Diagnosis not present

## 2021-04-21 DIAGNOSIS — H4323 Crystalline deposits in vitreous body, bilateral: Secondary | ICD-10-CM | POA: Diagnosis not present

## 2021-04-21 DIAGNOSIS — H2513 Age-related nuclear cataract, bilateral: Secondary | ICD-10-CM | POA: Diagnosis not present

## 2021-04-23 DIAGNOSIS — L821 Other seborrheic keratosis: Secondary | ICD-10-CM | POA: Diagnosis not present

## 2021-04-23 DIAGNOSIS — L720 Epidermal cyst: Secondary | ICD-10-CM | POA: Diagnosis not present

## 2021-04-23 DIAGNOSIS — Z86007 Personal history of in-situ neoplasm of skin: Secondary | ICD-10-CM | POA: Diagnosis not present

## 2021-04-23 DIAGNOSIS — L570 Actinic keratosis: Secondary | ICD-10-CM | POA: Diagnosis not present

## 2021-04-23 DIAGNOSIS — L820 Inflamed seborrheic keratosis: Secondary | ICD-10-CM | POA: Diagnosis not present

## 2021-04-23 DIAGNOSIS — R58 Hemorrhage, not elsewhere classified: Secondary | ICD-10-CM | POA: Diagnosis not present

## 2021-04-23 DIAGNOSIS — I872 Venous insufficiency (chronic) (peripheral): Secondary | ICD-10-CM | POA: Diagnosis not present

## 2021-04-23 DIAGNOSIS — L814 Other melanin hyperpigmentation: Secondary | ICD-10-CM | POA: Diagnosis not present

## 2021-04-23 DIAGNOSIS — Z85828 Personal history of other malignant neoplasm of skin: Secondary | ICD-10-CM | POA: Diagnosis not present

## 2021-04-23 DIAGNOSIS — L538 Other specified erythematous conditions: Secondary | ICD-10-CM | POA: Diagnosis not present

## 2021-04-23 DIAGNOSIS — Z08 Encounter for follow-up examination after completed treatment for malignant neoplasm: Secondary | ICD-10-CM | POA: Diagnosis not present

## 2021-04-23 DIAGNOSIS — D225 Melanocytic nevi of trunk: Secondary | ICD-10-CM | POA: Diagnosis not present

## 2021-05-07 DIAGNOSIS — H2513 Age-related nuclear cataract, bilateral: Secondary | ICD-10-CM | POA: Diagnosis not present

## 2021-05-08 ENCOUNTER — Ambulatory Visit: Payer: Medicare Other | Admitting: Family Medicine

## 2021-05-09 ENCOUNTER — Other Ambulatory Visit: Payer: Self-pay | Admitting: Family Medicine

## 2021-05-14 ENCOUNTER — Ambulatory Visit (INDEPENDENT_AMBULATORY_CARE_PROVIDER_SITE_OTHER): Payer: Medicare Other | Admitting: Family Medicine

## 2021-05-14 ENCOUNTER — Other Ambulatory Visit: Payer: Self-pay

## 2021-05-14 ENCOUNTER — Encounter: Payer: Self-pay | Admitting: Family Medicine

## 2021-05-14 VITALS — BP 128/78 | HR 92 | Temp 98.1°F | Resp 18 | Ht 70.5 in | Wt 254.0 lb

## 2021-05-14 DIAGNOSIS — H6123 Impacted cerumen, bilateral: Secondary | ICD-10-CM | POA: Diagnosis not present

## 2021-05-14 DIAGNOSIS — R42 Dizziness and giddiness: Secondary | ICD-10-CM | POA: Diagnosis not present

## 2021-05-14 NOTE — Progress Notes (Signed)
Subjective:    Patient ID: Brendan Hopkins, male    DOB: [birth year], 85 y.o.   MRN: [id number]  HPI  Patient is a very pleasant 85 year old Caucasian gentleman who presents today complaining of vertigo.  He also complains of feeling like both ears are stopped up.  On examination today, he has cerumen impactions in both ears.  This is likely explaining the decreased hearing that he is reporting.  However the vertigo was not related to this.  He has a history of BPPV and has meclizine at home.  He states that over the last 3 days he has had vertigo with position changes.  It is not severe however he does get the sensation that his environment is moving whenever he stands up or changes positions quickly. Past Medical History:  Diagnosis Date   Acute deep vein thrombosis (DVT) of tibial vein of right lower extremity (HCC)    after ankle fracture/surgery (2/21)   Arthritis    RIGHT HIP, HANDS, BACK   Blood clot in vein    right lower leg   Cancer (HCC)    Skin cancer    Cataract    Cervical radiculopathy    severe C3-4 neuroforaminal stenosis, C5-6 left synovial cyst compressing left nerve root.   Colon polyps    2005   COPD (chronic obstructive pulmonary disease) ([place])    SMOKED FOR 67 YRS   Diverticulosis    2005   Fatty liver    GERD (gastroesophageal reflux disease)    Hard of hearing    History of kidney stones    Hypertension    Leg fracture, right    Pain    RIGHT HIP AND BACK   Personal history of colonic polyps-adenoma and sessile serrated polyp 07/15/2010   Pneumonia 03/2019   RBBB    POSS HX AF IN PAST- PAC'S   Shortness of breath    WITH EXERTION ONLY   Stroke ([place])    TIA   TIA (transient ischemic attack)    Wears glasses    Past Surgical History:  Procedure Laterality Date   COLONOSCOPY     maybe 3    COLONOSCOPY W/ POLYPECTOMY     CYSTOSCOPY WITH RETROGRADE PYELOGRAM, URETEROSCOPY AND STENT PLACEMENT Left 12/11/2019   Procedure: CYSTOSCOPY WITH LEFT  RETROGRADE LEFT  URETEROSCOPY WITH HOLMIUM LASER AND STENT PLACEMENT;  Surgeon: Irine Seal, MD;  Location: [facility];  Service: Urology;  Laterality: Left;   ORIF ANKLE FRACTURE Right 07/11/2019   ORIF ANKLE FRACTURE Right 07/11/2019   Procedure: OPEN REDUCTION INTERNAL FIXATION (ORIF) ANKLE FRACTURE;  Surgeon: Shona Needles, MD;  Location: [facility];  Service: Orthopedics;  Laterality: Right;   skin cancers removed      TOTAL HIP ARTHROPLASTY Right 01/09/2013   Procedure: RIGHT TOTAL HIP ARTHROPLASTY ANTERIOR APPROACH;  Surgeon: [name], MD;  Location: [facility];  Service: Orthopedics;  Laterality: Right;   TOTAL HIP ARTHROPLASTY Left 02/24/2021   Procedure: TOTAL HIP ARTHROPLASTY ANTERIOR APPROACH;  Surgeon: Paralee Cancel, MD;  Location: [facility];  Service: Orthopedics;  Laterality: Left;   Current Outpatient Medications on File Prior to Visit  Medication Sig Dispense Refill   albuterol (VENTOLIN HFA) 108 (90 Base) MCG/ACT inhaler Inhale 1-2 puffs into the lungs every 6 (six) hours as needed for wheezing or shortness of breath. 1 each 6   amLODipine (NORVASC) 5 MG tablet Take 1 tablet by mouth at bedtime 30 tablet 11   docusate sodium (COLACE)  100 MG capsule Take 1 capsule (100 mg total) by mouth 2 (two) times daily. 10 capsule 0   furosemide (LASIX) 40 MG tablet Take 1 tablet (40 mg total) by mouth daily. 90 tablet 3   HYDROcodone-acetaminophen (NORCO/VICODIN) 5-325 MG tablet Take 1 tablet by mouth every 4 (four) hours as needed.     meclizine (ANTIVERT) 25 MG tablet TAKE ONE TABLET BY MOUTH THREE TIMES DAILY AS NEEDED FOR DIZZINESS (Patient taking differently: Take 25 mg by mouth 3 (three) times daily as needed for dizziness. TAKE ONE TABLET BY MOUTH THREE TIMES DAILY AS NEEDED FOR DIZZINESS) 90 tablet 2   methocarbamol (ROBAXIN) 500 MG tablet Take 1 tablet (500 mg total) by mouth every 6 (six) hours as needed for muscle spasms. 40 tablet 0   moxifloxacin (VIGAMOX) 0.5 % ophthalmic solution Apply 1  drop to eye 4 (four) times daily.     Multiple Vitamin (MULTIVITAMIN WITH MINERALS) TABS Take 1 tablet by mouth daily.     pantoprazole (PROTONIX) 40 MG tablet Take 1 tablet by mouth once daily 30 tablet 11   polyethylene glycol powder (GLYCOLAX/MIRALAX) 17 GM/SCOOP powder polyethylene glycol 3350 17 gram oral powder packet  MIX 17 GM IN 2-4 OUNCES OF LIQUID AND DRINK BY MOUTH DAILY AS NEEDED FOR MILD CONSTIPATION.     prednisoLONE acetate (PRED FORTE) 1 % ophthalmic suspension Place 1 drop into the left eye 4 (four) times daily.     triamcinolone cream (KENALOG) 0.1 % Apply 1 application topically 2 (two) times daily as needed (irritation).     umeclidinium-vilanterol (ANORO ELLIPTA) 62.5-25 MCG/INH AEPB Inhale 1 puff into the lungs daily. 1 each 11   valsartan (DIOVAN) 80 MG tablet Take 1 tablet (80 mg total) by mouth daily. 90 tablet 3   zolpidem (AMBIEN) 10 MG tablet Take 1 tablet (10 mg total) by mouth at bedtime as needed for sleep. 90 tablet 1   rivaroxaban (XARELTO) 10 MG TABS tablet Take 1 tablet (10 mg total) by mouth daily with breakfast for 14 days. Then take aspirin 81 mg twice daily for a month. 14 tablet 0   No current facility-administered medications on file prior to visit.   No Known Allergies Social History   Socioeconomic History   Marital status: Widowed    Spouse name: Not on file   Number of children: 2   Years of education: 51   Highest education level: Not on file  Occupational History   Occupation: Retired  Tobacco Use   Smoking status: Former    Years: 57.00    Types: Cigarettes    Quit date: 05/25/2007    Years since quitting: 13.9   Smokeless tobacco: Never   Tobacco comments:    Does not qualify due to age.   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   Social Determinants of Radio broadcast assistant Strain: Low Risk    Difficulty of Paying Living Expenses: Not hard at all  Food Insecurity: No Food Insecurity   Worried About Charity fundraiser in the Last Year: Never true   Arboriculturist in the Last Year: Never true  Transportation  Needs: No Transportation Needs   Lack of Transportation (Medical): No   Lack of Transportation (Non-Medical): No  Physical Activity: Insufficiently Active   Days of Exercise per Week: 3 days   Minutes of Exercise per Session: 30 min  Stress: No Stress Concern Present   Feeling of Stress : Not at all  Social Connections: Moderately Integrated   Frequency of Communication with Friends and Family: More than three times a week   Frequency of Social Gatherings with Friends and Family: More than three times a week   Attends Religious Services: More than 4 times per year   Active Member of [employer] or Organizations: Yes   Attends Archivist Meetings: More than 4 times per year   Marital Status: Widowed  Human resources officer Violence: Not At Risk   Fear of Current or Ex-Partner: No   Emotionally Abused: No   Physically Abused: No   Sexually Abused: No     Review of Systems  HENT:  Positive for hearing loss. Negative for congestion, ear discharge, ear pain and facial swelling.       Objective:   Physical Exam Constitutional:      Appearance: Normal appearance. He is normal weight.  HENT:     Right Ear: There is impacted cerumen.     Left Ear: There is impacted cerumen.  Cardiovascular:     Rate and Rhythm: Normal rate and regular rhythm.     Heart sounds: Normal heart sounds.  Pulmonary:     Effort: Pulmonary effort is normal.     Breath sounds: Normal breath sounds.  Neurological:     General: No focal deficit present.     Mental Status: He is alert and oriented to person, place, and time.     Cranial Nerves: No cranial nerve  deficit.     Motor: No weakness.     Gait: Gait normal.          Assessment & Plan:   Vertigo  Bilateral impacted cerumen Bilateral cerumen impactions were removed with irrigation and a large.  The patient tolerated this without complication.  The vertigo we will treat with meclizine 25 mg every 8 hours as needed for vertigo.  Anticipate gradual improvement over the next 3 to 4 days.

## 2021-06-03 DIAGNOSIS — Z4789 Encounter for other orthopedic aftercare: Secondary | ICD-10-CM | POA: Diagnosis not present

## 2021-06-09 DIAGNOSIS — H2512 Age-related nuclear cataract, left eye: Secondary | ICD-10-CM | POA: Diagnosis not present

## 2021-07-05 ENCOUNTER — Other Ambulatory Visit: Payer: Self-pay | Admitting: Family Medicine

## 2021-07-08 DIAGNOSIS — H35372 Puckering of macula, left eye: Secondary | ICD-10-CM | POA: Diagnosis not present

## 2021-07-17 ENCOUNTER — Other Ambulatory Visit: Payer: Self-pay

## 2021-07-17 ENCOUNTER — Ambulatory Visit
Admission: RE | Admit: 2021-07-17 | Discharge: 2021-07-17 | Disposition: A | Discharge status: Home / Self Care | Payer: Medicare Other | Source: Ambulatory Visit | Attending: Family Medicine | Admitting: Family Medicine

## 2021-07-17 ENCOUNTER — Ambulatory Visit (INDEPENDENT_AMBULATORY_CARE_PROVIDER_SITE_OTHER): Payer: Medicare Other | Admitting: Family Medicine

## 2021-07-17 ENCOUNTER — Telehealth: Payer: Self-pay | Admitting: Pharmacist

## 2021-07-17 VITALS — BP 138/78 | HR 67 | Temp 97.5°F | Resp 18 | Ht 70.5 in | Wt 254.0 lb

## 2021-07-17 DIAGNOSIS — M545 Low back pain, unspecified: Secondary | ICD-10-CM | POA: Diagnosis not present

## 2021-07-17 DIAGNOSIS — R0609 Other forms of dyspnea: Secondary | ICD-10-CM

## 2021-07-17 DIAGNOSIS — M5432 Sciatica, left side: Secondary | ICD-10-CM

## 2021-07-17 DIAGNOSIS — M8588 Other specified disorders of bone density and structure, other site: Secondary | ICD-10-CM | POA: Diagnosis not present

## 2021-07-17 MED ORDER — GABAPENTIN 300 MG PO CAPS: 300.0000 mg | ORAL_CAPSULE | Freq: Three times a day (TID) | ORAL | 3 refills | Status: DC | PRN

## 2021-07-17 NOTE — Progress Notes (Signed)
Chronic Care Management Pharmacy Assistant   Name: Brendan Hopkins  MRN: [id number] DOB: 1935-12-31   Reason for Encounter: General Adherence Call    Recent office visits:  05/14/2021 [facility] (PCP) Brendan [name], MD; The vertigo we will treat with meclizine 25 mg every 8 hours as needed for vertigo.  Anticipate gradual improvement over the next 3 to 4 days.  Recent consult visits:  None since last CPP visit  Hospital visits:  None since last CPP visit  Medications: Outpatient Encounter Medications as of 07/17/2021  Medication Sig Note   albuterol (VENTOLIN HFA) 108 (90 Base) MCG/ACT inhaler Inhale 1 to 2 puffs by mouth every 6 hours as needed for shortness of breath    amLODipine (NORVASC) 5 MG tablet Take 1 tablet by mouth at bedtime    docusate sodium (COLACE) 100 MG capsule Take 1 capsule (100 mg total) by mouth 2 (two) times daily.    furosemide (LASIX) 40 MG tablet Take 1 tablet (40 mg total) by mouth daily.    gabapentin (NEURONTIN) 300 MG capsule Take 1 capsule (300 mg total) by mouth 3 (three) times daily as needed (for sciatica).    HYDROcodone-acetaminophen (NORCO/VICODIN) 5-325 MG tablet Take 1 tablet by mouth every 4 (four) hours as needed.    meclizine (ANTIVERT) 25 MG tablet TAKE ONE TABLET BY MOUTH THREE TIMES DAILY AS NEEDED FOR DIZZINESS (Patient taking differently: Take 25 mg by mouth 3 (three) times daily as needed for dizziness. TAKE ONE TABLET BY MOUTH THREE TIMES DAILY AS NEEDED FOR DIZZINESS) 03/05/2021: PRN   methocarbamol (ROBAXIN) 500 MG tablet Take 1 tablet (500 mg total) by mouth every 6 (six) hours as needed for muscle spasms.    moxifloxacin (VIGAMOX) 0.5 % ophthalmic solution Apply 1 drop to eye 4 (four) times daily.    Multiple Vitamin (MULTIVITAMIN WITH MINERALS) TABS Take 1 tablet by mouth daily.    pantoprazole (PROTONIX) 40 MG tablet Take 1 tablet by mouth once daily    polyethylene glycol powder (GLYCOLAX/MIRALAX) 17 GM/SCOOP powder  polyethylene glycol 3350 17 gram oral powder packet  MIX 17 GM IN 2-4 OUNCES OF LIQUID AND DRINK BY MOUTH DAILY AS NEEDED FOR MILD CONSTIPATION.    prednisoLONE acetate (PRED FORTE) 1 % ophthalmic suspension Place 1 drop into the left eye 4 (four) times daily.    rivaroxaban (XARELTO) 10 MG TABS tablet Take 1 tablet (10 mg total) by mouth daily with breakfast for 14 days. Then take aspirin 81 mg twice daily for a month.    triamcinolone cream (KENALOG) 0.1 % Apply 1 application topically 2 (two) times daily as needed (irritation).    umeclidinium-vilanterol (ANORO ELLIPTA) 62.5-25 MCG/INH AEPB Inhale 1 puff into the lungs daily.    valsartan (DIOVAN) 80 MG tablet Take 1 tablet (80 mg total) by mouth daily.    zolpidem (AMBIEN) 10 MG tablet Take 1 tablet (10 mg total) by mouth at bedtime as needed for sleep.    No facility-administered encounter medications on file as of 07/17/2021.   Patient Questions: Have you had any problems recently with your health? Patient states he has had some sciatica pain and swelling in his legs.  Have you had any problems with your pharmacy? Patient states he hasn't had any problems with his pharmay.  What issues or side effects are you having with your medications? Patient states he hasn't had any issues or side effects from any of his medications.  What would you like me to  pass along to [employer], CPP for him to help you with?  Patient does not have anything for me to pass along at this time.  What can we do to take care of you better? Patient did not have any suggestions. He is happy with his current level of care.  Care Gaps: Medicare Annual Wellness: Completed 03/26/2021 Hemoglobin A1C: 6.0% on 01/26/2019 Colonoscopy: Aged out  Future Appointments  Date Time Provider [place]  08/13/2021 11:15 AM Brendan [name], MD [facility] None  03/26/2022 11:15 AM [facility]-NURSE HEALTH ADVISOR [facility] None    Star Rating Drugs: Valsartan 80 mg last  filled 06/05/2021 90 DS  April D Calhoun, [place] Pharmacist Assistant [phone]

## 2021-07-17 NOTE — Progress Notes (Signed)
Subjective:    Patient ID: Brendan Hopkins, male    DOB: 25-Feb-1936, 86 y.o.   MRN: [id number]  Back Pain Associated symptoms include leg pain.  Leg Pain   Patient presents today with 2 problems.  First he reports back pain around the level of L5 that radiates down his left leg.  He reports burning and stinging pain radiating down his left leg.  He reports sharp stabbing low back pain that radiates into his gluteus.  He states this has been going on for more than a year.  He also reports increasing dyspnea on exertion.  We did an echocardiogram last year that showed a normal ejection fraction, grade 1 diastolic dysfunction.  He has known COPD.  We have assumed that the dyspnea on exertion was due to his COPD.  He denies any chest pain.  He states however that if he exercises or does any physical work, he has to sit down for 10 minutes just to catch his breath.  If he starts to walk again he quickly becomes winded and has to sit down and rest.  He has not had a stress test since 2014 Past Medical History:  Diagnosis Date   Acute deep vein thrombosis (DVT) of tibial vein of right lower extremity (HCC)    after ankle fracture/surgery (2/21)   Arthritis    RIGHT HIP, HANDS, BACK   Blood clot in vein    right lower leg   Cancer (HCC)    Skin cancer    Cataract    Cervical radiculopathy    severe C3-4 neuroforaminal stenosis, C5-6 left synovial cyst compressing left nerve root.   Colon polyps    2005   COPD (chronic obstructive pulmonary disease) ([place])    SMOKED FOR 81 YRS   Diverticulosis    2005   Fatty liver    GERD (gastroesophageal reflux disease)    Hard of hearing    History of kidney stones    Hypertension    Leg fracture, right    Pain    RIGHT HIP AND BACK   Personal history of colonic polyps-adenoma and sessile serrated polyp 07/15/2010   Pneumonia 03/2019   RBBB    POSS HX AF IN PAST- PAC'S   Shortness of breath    WITH EXERTION ONLY   Stroke ([place])    TIA    TIA (transient ischemic attack)    Wears glasses    Past Surgical History:  Procedure Laterality Date   COLONOSCOPY     maybe 3    COLONOSCOPY W/ POLYPECTOMY     CYSTOSCOPY WITH RETROGRADE PYELOGRAM, URETEROSCOPY AND STENT PLACEMENT Left 12/11/2019   Procedure: CYSTOSCOPY WITH LEFT RETROGRADE LEFT  URETEROSCOPY WITH HOLMIUM LASER AND STENT PLACEMENT;  Surgeon: Irine Seal, MD;  [facility];  Service: Urology;  Laterality: Left;   ORIF ANKLE FRACTURE Right 07/11/2019   ORIF ANKLE FRACTURE Right 07/11/2019   Procedure: OPEN REDUCTION INTERNAL FIXATION (ORIF) ANKLE FRACTURE;  Surgeon: Shona Needles, MD;  Location: [facility];  Service: Orthopedics;  Laterality: Right;   skin cancers removed      TOTAL HIP ARTHROPLASTY Right 01/09/2013   Procedure: RIGHT TOTAL HIP ARTHROPLASTY ANTERIOR APPROACH;  Surgeon: [name], MD;  [facility];  Service: Orthopedics;  Laterality: Right;   TOTAL HIP ARTHROPLASTY Left 02/24/2021   Procedure: TOTAL HIP ARTHROPLASTY ANTERIOR APPROACH;  Surgeon: Paralee Cancel, MD;  [facility];  Service: Orthopedics;  Laterality: Left;   Current  Outpatient Medications on File Prior to Visit  Medication Sig Dispense Refill   albuterol (VENTOLIN HFA) 108 (90 Base) MCG/ACT inhaler Inhale 1 to 2 puffs by mouth every 6 hours as needed for shortness of breath 6.7 each 11   amLODipine (NORVASC) 5 MG tablet Take 1 tablet by mouth at bedtime 30 tablet 11   docusate sodium (COLACE) 100 MG capsule Take 1 capsule (100 mg total) by mouth 2 (two) times daily. 10 capsule 0   furosemide (LASIX) 40 MG tablet Take 1 tablet (40 mg total) by mouth daily. 90 tablet 3   HYDROcodone-acetaminophen (NORCO/VICODIN) 5-325 MG tablet Take 1 tablet by mouth every 4 (four) hours as needed.     meclizine (ANTIVERT) 25 MG tablet TAKE ONE TABLET BY MOUTH THREE TIMES DAILY AS NEEDED FOR DIZZINESS (Patient taking differently: Take 25 mg by mouth 3 (three) times daily as needed for dizziness. TAKE  ONE TABLET BY MOUTH THREE TIMES DAILY AS NEEDED FOR DIZZINESS) 90 tablet 2   methocarbamol (ROBAXIN) 500 MG tablet Take 1 tablet (500 mg total) by mouth every 6 (six) hours as needed for muscle spasms. 40 tablet 0   moxifloxacin (VIGAMOX) 0.5 % ophthalmic solution Apply 1 drop to eye 4 (four) times daily.     Multiple Vitamin (MULTIVITAMIN WITH MINERALS) TABS Take 1 tablet by mouth daily.     pantoprazole (PROTONIX) 40 MG tablet Take 1 tablet by mouth once daily 30 tablet 11   polyethylene glycol powder (GLYCOLAX/MIRALAX) 17 GM/SCOOP powder polyethylene glycol 3350 17 gram oral powder packet  MIX 17 GM IN 2-4 OUNCES OF LIQUID AND DRINK BY MOUTH DAILY AS NEEDED FOR MILD CONSTIPATION.     prednisoLONE acetate (PRED FORTE) 1 % ophthalmic suspension Place 1 drop into the left eye 4 (four) times daily.     triamcinolone cream (KENALOG) 0.1 % Apply 1 application topically 2 (two) times daily as needed (irritation).     umeclidinium-vilanterol (ANORO ELLIPTA) 62.5-25 MCG/INH AEPB Inhale 1 puff into the lungs daily. 1 each 11   valsartan (DIOVAN) 80 MG tablet Take 1 tablet (80 mg total) by mouth daily. 90 tablet 3   zolpidem (AMBIEN) 10 MG tablet Take 1 tablet (10 mg total) by mouth at bedtime as needed for sleep. 90 tablet 1   rivaroxaban (XARELTO) 10 MG TABS tablet Take 1 tablet (10 mg total) by mouth daily with breakfast for 14 days. Then take aspirin 81 mg twice daily for a month. 14 tablet 0   No current facility-administered medications on file prior to visit.   No Known Allergies Social History   Socioeconomic History   Marital status: Widowed    Spouse name: Not on file   Number of children: 2   Years of education: 10   Highest education level: Not on file  Occupational History   Occupation: Retired  Tobacco Use   Smoking status: Former    Years: 57.00    Types: Cigarettes    Quit date: 05/25/2007    Years since quitting: 14.1   Smokeless tobacco: Never   Tobacco comments:    Does  not qualify due to age.   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   Social Determinants of Radio broadcast assistant Strain: Low Risk    Difficulty of Paying Living Expenses: Not hard at all  Food Insecurity: No Food Insecurity   Worried About Charity fundraiser in the Last Year: Never true   Arboriculturist in the Last Year: Never true  Transportation Needs: No Transportation Needs   Lack of Transportation (Medical): No   Lack of Transportation (Non-Medical): No  Physical Activity: Insufficiently Active   Days of Exercise per Week: 3 days   Minutes of Exercise per Session: 30 min  Stress: No Stress Concern Present   Feeling of Stress : Not at all  Social Connections: Moderately Integrated   Frequency of Communication with Friends and Family: More than three times a week   Frequency of Social Gatherings with Friends and Family: More than three times a week   Attends Religious Services: More than 4 times per year   Active Member of [employer] or Organizations: Yes   Attends Archivist Meetings: More than 4 times per year   Marital Status: Widowed  Human resources officer Violence: Not At Risk   Fear of Current or Ex-Partner: No   Emotionally Abused: No   Physically Abused: No   Sexually Abused: No    Review of Systems  Musculoskeletal:  Positive for back pain.      Objective:   Physical Exam Vitals reviewed.  Constitutional:      General: He is not in acute distress.    Appearance: Normal appearance. He is normal weight. He is not ill-appearing or toxic-appearing.  Cardiovascular:     Rate and Rhythm: Normal rate and regular rhythm.     Heart sounds: Normal heart sounds.  Pulmonary:     Effort: Pulmonary  effort is normal. No respiratory distress.     Breath sounds: Decreased air movement present. No stridor. Decreased breath sounds and wheezing present. No rhonchi or rales.  Chest:     Chest wall: No tenderness.  Musculoskeletal:     Right lower leg: Edema present.     Left lower leg: Edema present.  Skin:    Findings: No erythema or rash.  Neurological:     Mental Status: He is alert.          Assessment & Plan:    Left sided sciatica - Plan: DG Lumbar Spine Complete  Dyspnea on exertion - Plan: Ambulatory referral to Cardiology Patient has sciatica.  We will begin by obtaining an x-ray of his lumbar spine.  Meanwhile try gabapentin 300 mg p.o. 3 times daily as needed nerve pain in addition to Tylenol.  I will consult cardiology.  Given his progressive dyspnea on exertion, I do not feel comfortable just blaming this on COPD until he has a normal stress test.  If he passes the stress test, we may need to consider portable oxygen.

## 2021-07-19 ENCOUNTER — Other Ambulatory Visit: Payer: Self-pay | Admitting: Family Medicine

## 2021-07-20 NOTE — Progress Notes (Signed)
Referring-Warren Pickard MD Reason for referral-DOE  HPI: 86 yo male for evaluation of DOE at request of Jenna Luo MD. I have seen pt previously but not since 5/14. Note pt also with h/o COPD. Abd ultrasound 6/14 showed no AAA. Echo 7/22 showed normal LV function, grade 1 DD, trace AI and mildly dilated aortic root. Lower ext venous dopplers 10/22 showed no DVT on left.  Patient states that he has had dyspnea on exertion since contracting COVID 2 years ago.  He notes dyspnea with more vigorous activities but not in the house.  No orthopnea, PND, chest pain or syncope.  He has chronic mild bilateral lower extremity edema.  Cardiology now asked to evaluate.  Current Outpatient Medications  Medication Sig Dispense Refill   albuterol (VENTOLIN HFA) 108 (90 Base) MCG/ACT inhaler Inhale 1 to 2 puffs by mouth every 6 hours as needed for shortness of breath 6.7 each 11   amLODipine (NORVASC) 5 MG tablet Take 1 tablet by mouth at bedtime 30 tablet 11   docusate sodium (COLACE) 100 MG capsule Take 1 capsule (100 mg total) by mouth 2 (two) times daily. 10 capsule 0   furosemide (LASIX) 40 MG tablet Take 1 tablet (40 mg total) by mouth daily. 90 tablet 3   gabapentin (NEURONTIN) 300 MG capsule Take 1 capsule (300 mg total) by mouth 3 (three) times daily as needed (for sciatica). 90 capsule 3   HYDROcodone-acetaminophen (NORCO/VICODIN) 5-325 MG tablet Take 1 tablet by mouth every 4 (four) hours as needed.     meclizine (ANTIVERT) 25 MG tablet Take 1 tablet by mouth 3 times a day as needed 90 tablet 11   methocarbamol (ROBAXIN) 500 MG tablet Take 1 tablet (500 mg total) by mouth every 6 (six) hours as needed for muscle spasms. 40 tablet 0   moxifloxacin (VIGAMOX) 0.5 % ophthalmic solution Apply 1 drop to eye 4 (four) times daily.     Multiple Vitamin (MULTIVITAMIN WITH MINERALS) TABS Take 1 tablet by mouth daily.     pantoprazole (PROTONIX) 40 MG tablet Take 1 tablet by mouth once daily 30 tablet 11    polyethylene glycol powder (GLYCOLAX/MIRALAX) 17 GM/SCOOP powder polyethylene glycol 3350 17 gram oral powder packet  MIX 17 GM IN 2-4 OUNCES OF LIQUID AND DRINK BY MOUTH DAILY AS NEEDED FOR MILD CONSTIPATION.     prednisoLONE acetate (PRED FORTE) 1 % ophthalmic suspension Place 1 drop into the left eye 4 (four) times daily.     rivaroxaban (XARELTO) 10 MG TABS tablet Take 1 tablet (10 mg total) by mouth daily with breakfast for 14 days. Then take aspirin 81 mg twice daily for a month. 14 tablet 0   triamcinolone cream (KENALOG) 0.1 % Apply 1 application topically 2 (two) times daily as needed (irritation).     umeclidinium-vilanterol (ANORO ELLIPTA) 62.5-25 MCG/INH AEPB Inhale 1 puff into the lungs daily. 1 each 11   valsartan (DIOVAN) 80 MG tablet Take 1 tablet (80 mg total) by mouth daily. 90 tablet 3   zolpidem (AMBIEN) 10 MG tablet Take 1 tablet (10 mg total) by mouth at bedtime as needed for sleep. 90 tablet 1   No current facility-administered medications for this visit.    No Known Allergies   Past Medical History:  Diagnosis Date   Acute deep vein thrombosis (DVT) of tibial vein of right lower extremity (HCC)    after ankle fracture/surgery (2/21)   Arthritis    RIGHT HIP, HANDS, BACK  Cancer [facility])    Skin cancer    Cataract    Cervical radiculopathy    severe C3-4 neuroforaminal stenosis, C5-6 left synovial cyst compressing left nerve root.   Colon polyps    2005   COPD (chronic obstructive pulmonary disease) ([place])    SMOKED FOR 72 YRS   Diverticulosis    2005   Fatty liver    GERD (gastroesophageal reflux disease)    Hard of hearing    History of kidney stones    Hypertension    Leg fracture, right    Pain    RIGHT HIP AND BACK   Personal history of colonic polyps-adenoma and sessile serrated polyp 07/15/2010   Pneumonia 03/2019   RBBB    POSS HX AF IN PAST- PAC'S   Shortness of breath    WITH EXERTION ONLY   Stroke ([place])    TIA   TIA (transient ischemic  attack)    Wears glasses     Past Surgical History:  Procedure Laterality Date   COLONOSCOPY     maybe 3    COLONOSCOPY W/ POLYPECTOMY     CYSTOSCOPY WITH RETROGRADE PYELOGRAM, URETEROSCOPY AND STENT PLACEMENT Left 12/11/2019   Procedure: CYSTOSCOPY WITH LEFT RETROGRADE LEFT  URETEROSCOPY WITH HOLMIUM LASER AND STENT PLACEMENT;  Surgeon: Irine Seal, MD;  [facility];  Service: Urology;  Laterality: Left;   ORIF ANKLE FRACTURE Right 07/11/2019   ORIF ANKLE FRACTURE Right 07/11/2019   Procedure: OPEN REDUCTION INTERNAL FIXATION (ORIF) ANKLE FRACTURE;  Surgeon: Shona Needles, MD;  Location: [place];  Service: Orthopedics;  Laterality: Right;   skin cancers removed      TOTAL HIP ARTHROPLASTY Right 01/09/2013   Procedure: RIGHT TOTAL HIP ARTHROPLASTY ANTERIOR APPROACH;  Surgeon: [name], MD;  [facility];  Service: Orthopedics;  Laterality: Right;   TOTAL HIP ARTHROPLASTY Left 02/24/2021   Procedure: TOTAL HIP ARTHROPLASTY ANTERIOR APPROACH;  Surgeon: Paralee Cancel, MD;  [facility];  Service: Orthopedics;  Laterality: Left;    Social History   Socioeconomic History   Marital status: Widowed    Spouse name: Not on file   Number of children: 3   Years of education: 40   Highest education level: Not on file  Occupational History   Occupation: Retired  Tobacco Use   Smoking status: Former    Years: 57.00    Types: Cigarettes    Quit date: 05/25/2007    Years since quitting: 14.1   Smokeless tobacco: Never   Tobacco comments:    Does not qualify due to age.   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   Social Determinants of Radio broadcast assistant Strain: Low Risk    Difficulty of Paying Living  Expenses: Not hard at all  Food Insecurity: No Food Insecurity   Worried About Charity fundraiser in the Last Year: Never true   Arboriculturist in the Last Year: Never true  Transportation Needs: No Transportation Needs   Lack of Transportation (Medical): No   Lack of Transportation (Non-Medical): No  Physical Activity: Insufficiently Active   Days of Exercise per Week: 3  days   Minutes of Exercise per Session: 30 min  Stress: No Stress Concern Present   Feeling of Stress : Not at all  Social Connections: Moderately Integrated   Frequency of Communication with Friends and Family: More than three times a week   Frequency of Social Gatherings with Friends and Family: More than three times a week   Attends Religious Services: More than 4 times per year   Active Member of [employer] or Organizations: Yes   Attends Archivist Meetings: More than 4 times per year   Marital Status: Widowed  Human resources officer Violence: Not At Risk   Fear of Current or Ex-Partner: No   Emotionally Abused: No   Physically Abused: No   Sexually Abused: No    Family History  Problem Relation Age of Onset   Cancer Mother        died more of old age at age 35   Cancer Father    Colon cancer Neg Hx    Esophageal cancer Neg Hx    Prostate cancer Neg Hx    Rectal cancer Neg Hx    Stomach cancer Neg Hx     ROS: no fevers or chills, productive cough, hemoptysis, dysphasia, odynophagia, melena, hematochezia, dysuria, hematuria, rash, seizure activity, orthopnea, PND, claudication. Remaining systems are negative.  Physical Exam:   Blood pressure 123/82, pulse 71, height 5\' 11"  (1.803 m), weight 254 lb 9.6 oz (115.5 kg), SpO2 98 %.  General:  Well developed/well nourished in NAD Skin warm/dry Patient not depressed No peripheral clubbing Back-normal HEENT-normal/normal eyelids Neck supple/normal carotid upstroke bilaterally; no bruits; no JVD; no thyromegaly chest -diminished breath sounds, no  wheeze. CV - RRR/normal S1 and S2; no murmurs, rubs or gallops;  PMI nondisplaced Abdomen -NT/ND, no HSM, no mass, + bowel sounds, no bruit 2+ femoral pulses, no bruits Ext-trace to 1+ ankle edema, no chords, 2+ DP Neuro-grossly nonfocal  ECG -normal sinus rhythm, left anterior fascicular block, right bundle branch block, occasional PAC.  Personally reviewed  A/P  1 DOE-recent echo showed normal LV function; likely component related to COPD/previous COVID infection.  However will arrange [employer] nuclear study to screen for ischemia.  If normal we will not pursue further cardiac evaluation.  2 hypertension-blood pressure controlled.  Continue present medications and follow.  Kirk Ruths, MD

## 2021-07-21 ENCOUNTER — Other Ambulatory Visit: Payer: Self-pay

## 2021-07-21 ENCOUNTER — Encounter: Payer: Self-pay | Admitting: Cardiology

## 2021-07-21 ENCOUNTER — Ambulatory Visit: Payer: Medicare Other | Admitting: Cardiology

## 2021-07-21 VITALS — BP 123/82 | HR 71 | Ht 71.0 in | Wt 254.6 lb

## 2021-07-21 DIAGNOSIS — R0609 Other forms of dyspnea: Secondary | ICD-10-CM | POA: Diagnosis not present

## 2021-07-21 DIAGNOSIS — I10 Essential (primary) hypertension: Secondary | ICD-10-CM

## 2021-07-21 NOTE — Patient Instructions (Signed)
    Testing/Procedures:  Your physician has requested that you have a lexiscan myoview. For further information please visit [email or web address]. Please follow instruction sheet, as given. [address]  Follow-Up: At [facility], you and your health needs are our priority.  As part of our continuing mission to provide you with exceptional heart care, we have created designated Provider Care Teams.  These Care Teams include your primary Cardiologist (physician) and Advanced Practice Providers (APPs -  Physician Assistants and Nurse Practitioners) who all work together to provide you with the care you need, when you need it.  We recommend signing up for the patient portal called "MyChart".  Sign up information is provided on this After Visit Summary.  MyChart is used to connect with patients for Virtual Visits (Telemedicine).  Patients are able to view lab/test results, encounter notes, upcoming appointments, etc.  Non-urgent messages can be sent to your provider as well.   To learn more about what you can do with MyChart, go to [email or web address].    Your next appointment:    AS NEEDED 

## 2021-07-22 ENCOUNTER — Telehealth (HOSPITAL_COMMUNITY): Payer: Self-pay

## 2021-07-22 NOTE — Telephone Encounter (Signed)
Detailed instructions left on the patient's answering machine. Asked to call back with any questions. S.Shanta Dorvil EMTP 

## 2021-07-23 ENCOUNTER — Other Ambulatory Visit: Payer: Self-pay

## 2021-07-23 ENCOUNTER — Ambulatory Visit (HOSPITAL_COMMUNITY): Payer: Medicare Other | Attending: Cardiology

## 2021-07-23 DIAGNOSIS — R0609 Other forms of dyspnea: Secondary | ICD-10-CM | POA: Insufficient documentation

## 2021-07-23 LAB — MYOCARDIAL PERFUSION IMAGING
LV dias vol: 88 mL (ref 62–150)
LV sys vol: 35 mL
Nuc Stress EF: 60 %
Rest Nuclear Isotope Dose: 10.2 mCi
SDS: 2
SRS: 0
SSS: 2
ST Depression (mm): 0 mm
Stress Nuclear Isotope Dose: 31.9 mCi
TID: 0.98

## 2021-07-23 MED ORDER — REGADENOSON 0.4 MG/5ML IV SOLN
0.4000 mg | Freq: Once | INTRAVENOUS | Status: AC
  Administered 2021-07-23: 0.4 mg via INTRAVENOUS

## 2021-07-23 MED ORDER — TECHNETIUM TC 99M TETROFOSMIN IV KIT
31.9000 | PACK | Freq: Once | INTRAVENOUS | Status: AC | PRN
  Administered 2021-07-23: 31.9 via INTRAVENOUS
  Filled 2021-07-23: qty 32

## 2021-07-23 MED ORDER — TECHNETIUM TC 99M TETROFOSMIN IV KIT
10.2000 | PACK | Freq: Once | INTRAVENOUS | Status: AC | PRN
  Administered 2021-07-23: 10.2 via INTRAVENOUS
  Filled 2021-07-23: qty 11

## 2021-07-29 ENCOUNTER — Other Ambulatory Visit: Payer: Self-pay | Admitting: Family Medicine

## 2021-07-29 DIAGNOSIS — S32020A Wedge compression fracture of second lumbar vertebra, initial encounter for closed fracture: Secondary | ICD-10-CM

## 2021-07-29 DIAGNOSIS — M419 Scoliosis, unspecified: Secondary | ICD-10-CM

## 2021-07-29 DIAGNOSIS — M5432 Sciatica, left side: Secondary | ICD-10-CM

## 2021-07-29 DIAGNOSIS — M5136 Other intervertebral disc degeneration, lumbar region: Secondary | ICD-10-CM

## 2021-07-30 NOTE — Telephone Encounter (Signed)
LOV 07/17/21 ?Last refill 02/27/21, #90, 1 refill ? ?Request is for #30 with 5 refills - likely per insurance plan ? ?Please review, thanks! ?

## 2021-07-31 ENCOUNTER — Other Ambulatory Visit: Payer: Self-pay | Admitting: Family Medicine

## 2021-07-31 DIAGNOSIS — K219 Gastro-esophageal reflux disease without esophagitis: Secondary | ICD-10-CM

## 2021-08-05 DIAGNOSIS — H4323 Crystalline deposits in vitreous body, bilateral: Secondary | ICD-10-CM | POA: Diagnosis not present

## 2021-08-05 DIAGNOSIS — H43823 Vitreomacular adhesion, bilateral: Secondary | ICD-10-CM | POA: Diagnosis not present

## 2021-08-05 DIAGNOSIS — H35373 Puckering of macula, bilateral: Secondary | ICD-10-CM | POA: Diagnosis not present

## 2021-08-05 DIAGNOSIS — H3581 Retinal edema: Secondary | ICD-10-CM | POA: Diagnosis not present

## 2021-08-07 ENCOUNTER — Other Ambulatory Visit: Payer: Self-pay | Admitting: Family Medicine

## 2021-08-13 ENCOUNTER — Encounter: Payer: Self-pay | Admitting: Family Medicine

## 2021-08-13 ENCOUNTER — Other Ambulatory Visit: Payer: Self-pay

## 2021-08-13 ENCOUNTER — Ambulatory Visit (INDEPENDENT_AMBULATORY_CARE_PROVIDER_SITE_OTHER): Payer: Medicare Other | Admitting: Family Medicine

## 2021-08-13 VITALS — BP 128/82 | HR 70 | Temp 97.6°F | Ht 71.0 in | Wt 254.8 lb

## 2021-08-13 DIAGNOSIS — J449 Chronic obstructive pulmonary disease, unspecified: Secondary | ICD-10-CM | POA: Diagnosis not present

## 2021-08-13 DIAGNOSIS — R0609 Other forms of dyspnea: Secondary | ICD-10-CM

## 2021-08-13 NOTE — Progress Notes (Signed)
? ?Subjective:  ? ? Patient ID: Brendan Hopkins, male    DOB: [birth year], 86 y.o.   MRN: [id number] ? ?HPI ?Due to leg swelling and dyspnea on exertion, I had scheduled the patient to meet with cardiology.  Results of his most recent stress test are listed below.  The results are reassuring. ?  The study is normal. The study is low risk. ?  No ST deviation was noted. ?  LV perfusion is normal. ?  Left ventricular function is normal. Nuclear stress EF: 60 %. The left ventricular ejection fraction is normal (55-65%). End diastolic cavity size is normal. ?  Prior study not available for comparison. ? ?Echo 7/22- ? Left ventricular ejection fraction, by estimation, is 55%. The left  ?ventricle has low normal function. The left ventricle has no regional wall  ?motion abnormalities. There is moderate concentric left ventricular  ?hypertrophy. Left ventricular diastolic  ? parameters are consistent with Grade I diastolic dysfunction (impaired  ?relaxation).  ? ?Patient continues to have dyspnea on exertion.  He states that if he walks more than 60 yards he would have to stop and catch his breath.  He is using Anoro every day and he feels benefit from the Anoro.  He also continues to have leg swelling left greater than right although the swelling seems to be controlled well by the furosemide.  Otherwise he is doing well. ?Therefore, his dyspnea on exertion appears to be due secondary to COPD. ?Past Medical History:  ?Diagnosis Date  ? Acute deep vein thrombosis (DVT) of tibial vein of right lower extremity (HCC)   ? after ankle fracture/surgery (2/21)  ? Arthritis   ? RIGHT HIP, HANDS, BACK  ? Cancer [facility])   ? Skin cancer   ? Cataract   ? Cervical radiculopathy   ? severe C3-4 neuroforaminal stenosis, C5-6 left synovial cyst compressing left nerve root.  ? Colon polyps   ? 2005  ? COPD (chronic obstructive pulmonary disease) ([place])   ? SMOKED FOR 23 YRS  ? Diverticulosis   ? 2005  ? Fatty liver   ? GERD (gastroesophageal  reflux disease)   ? Hard of hearing   ? History of kidney stones   ? Hypertension   ? Leg fracture, right   ? Pain   ? RIGHT HIP AND BACK  ? Personal history of colonic polyps-adenoma and sessile serrated polyp 07/15/2010  ? Pneumonia 03/2019  ? RBBB   ? POSS HX AF IN PAST- PAC'S  ? Shortness of breath   ? WITH EXERTION ONLY  ? Stroke [facility])   ? TIA  ? TIA (transient ischemic attack)   ? Wears glasses   ? ?Past Surgical History:  ?Procedure Laterality Date  ? COLONOSCOPY    ? maybe 3   ? COLONOSCOPY W/ POLYPECTOMY    ? CYSTOSCOPY WITH RETROGRADE PYELOGRAM, URETEROSCOPY AND STENT PLACEMENT Left 12/11/2019  ? Procedure: CYSTOSCOPY WITH LEFT RETROGRADE LEFT  URETEROSCOPY WITH HOLMIUM LASER AND STENT PLACEMENT;  Surgeon: Irine Seal, MD;  [facility];  Service: Urology;  Laterality: Left;  ? ORIF ANKLE FRACTURE Right 07/11/2019  ? ORIF ANKLE FRACTURE Right 07/11/2019  ? Procedure: OPEN REDUCTION INTERNAL FIXATION (ORIF) ANKLE FRACTURE;  Surgeon: Shona Needles, MD;  Location: [place];  Service: Orthopedics;  Laterality: Right;  ? skin cancers removed     ? TOTAL HIP ARTHROPLASTY Right 01/09/2013  ? Procedure: RIGHT TOTAL HIP ARTHROPLASTY ANTERIOR APPROACH;  Surgeon: [name], MD;  [facility];  Service: Orthopedics;  Laterality: Right;  ? TOTAL HIP ARTHROPLASTY Left 02/24/2021  ? Procedure: TOTAL HIP ARTHROPLASTY ANTERIOR APPROACH;  Surgeon: Paralee Cancel, MD;  [facility];  Service: Orthopedics;  Laterality: Left;  ? ?Current Outpatient Medications on File Prior to Visit  ?Medication Sig Dispense Refill  ? albuterol (VENTOLIN HFA) 108 (90 Base) MCG/ACT inhaler Inhale 1 to 2 puffs by mouth every 6 hours as needed for shortness of breath 6.7 each 11  ? amLODipine (NORVASC) 5 MG tablet Take 1 tablet by mouth at bedtime 30 tablet 11  ? docusate sodium (COLACE) 100 MG capsule Take 1 capsule (100 mg total) by mouth 2 (two) times daily. 10 capsule 0  ? furosemide (LASIX) 40 MG tablet Take 1 tablet by mouth once  daily 30 tablet 11  ? gabapentin (NEURONTIN) 300 MG capsule Take 1 capsule (300 mg total) by mouth 3 (three) times daily as needed (for sciatica). 90 capsule 3  ? HYDROcodone-acetaminophen (NORCO/VICODIN) 5-325 MG tablet Take 1 tablet by mouth every 4 (four) hours as needed.    ? meclizine (ANTIVERT) 25 MG tablet Take 1 tablet by mouth 3 times a day as needed 90 tablet 11  ? methocarbamol (ROBAXIN) 500 MG tablet Take 1 tablet (500 mg total) by mouth every 6 (six) hours as needed for muscle spasms. 40 tablet 0  ? moxifloxacin (VIGAMOX) 0.5 % ophthalmic solution Apply 1 drop to eye 4 (four) times daily.    ? Multiple Vitamin (MULTIVITAMIN WITH MINERALS) TABS Take 1 tablet by mouth daily.    ? ofloxacin (OCUFLOX) 0.3 % ophthalmic solution Place 1 drop into the left eye 4 (four) times daily.    ? pantoprazole (PROTONIX) 40 MG tablet Take 1 tablet by mouth every day 30 tablet 11  ? polyethylene glycol powder (GLYCOLAX/MIRALAX) 17 GM/SCOOP powder polyethylene glycol 3350 17 gram oral powder packet ? MIX 17 GM IN 2-4 OUNCES OF LIQUID AND DRINK BY MOUTH DAILY AS NEEDED FOR MILD CONSTIPATION.    ? prednisoLONE acetate (PRED FORTE) 1 % ophthalmic suspension Place 1 drop into the left eye 4 (four) times daily.    ? rivaroxaban (XARELTO) 10 MG TABS tablet Take 1 tablet (10 mg total) by mouth daily with breakfast for 14 days. Then take aspirin 81 mg twice daily for a month. 14 tablet 0  ? triamcinolone cream (KENALOG) 0.1 % Apply 1 application topically 2 (two) times daily as needed (irritation).    ? umeclidinium-vilanterol (ANORO ELLIPTA) 62.5-25 MCG/INH AEPB Inhale 1 puff into the lungs daily. 1 each 11  ? valsartan (DIOVAN) 80 MG tablet Take 1 tablet (80 mg total) by mouth daily. 90 tablet 3  ? zolpidem (AMBIEN) 10 MG tablet Take 1 tablet by mouth at bedtime as needed for sleep 30 tablet 5  ? ?No current facility-administered medications on file prior to visit.  ? ?No Known Allergies ?Social History  ? ?Socioeconomic History   ? Marital status: Widowed  ?  Spouse name: Not on file  ? Number of children: 3  ? Years of education: 45  ? Highest education level: Not on file  ?Occupational History  ? Occupation: Retired  ?Tobacco Use  ? Smoking status: Former  ?  Years: 57.00  ?  Types: Cigarettes  ?  Quit date: 05/25/2007  ?  Years since quitting: 14.2  ? Smokeless tobacco: Never  ? Tobacco comments:  ?  Does not qualify due to age.   ?Vaping Use  ? Vaping  Use: Never used  ?Substance and Sexual Activity  ? Alcohol use: No  ?  Alcohol/week: 0.0 standard drinks  ? Drug use: No  ? Sexual activity: Not Currently  ?Other Topics Concern  ? Not on file  ?Social History Narrative  ? 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 ?Social Determinants of Health  ? ?Financial Resource Strain: Low Risk   ? Difficulty of Paying Living Expenses: Not hard at all  ?Food Insecurity: No Food Insecurity  ? Worried About Charity fundraiser in the Last Year: Never true  ? Ran Out of Food in the Last Year: Never true  ?Transportation Needs: No Transportation Needs  ? Lack of Transportation (Medical): No  ? Lack of Transportation (Non-Medical): No  ?Physical Activity: Insufficiently Active  ? Days of Exercise per Week: 3 days  ? Minutes of Exercise per Session: 30 min  ?Stress: No Stress Concern Present  ? Feeling of Stress : Not at all  ?Social Connections: Moderately Integrated  ? Frequency of Communication with Friends and Family: More than three times a week  ? Frequency of Social Gatherings with Friends and Family: More than three times a week  ? Attends Religious Services: More than 4 times per year  ? Active Member of Clubs or Organizations: Yes  ? Attends Archivist Meetings: More than 4 times per year  ? Marital Status: Widowed  ?Intimate Partner Violence: Not At Risk  ? Fear of Current or Ex-Partner: No  ? Emotionally Abused: No  ? Physically Abused: No  ? Sexually Abused: No  ? ? ?Review of Systems ? ?   ?Objective:  ?  Physical Exam ?Vitals reviewed.  ?Constitutional:   ?   General: He is not in acute distress. ?   Appearance: Normal appearance. He is normal weight. He is not ill-appearing or toxic-appearing.  ?[facility]

## 2021-08-14 LAB — CBC WITH DIFFERENTIAL/PLATELET
Absolute Monocytes: 611 cells/uL (ref 200–950)
Basophils Absolute: 57 cells/uL (ref 0–200)
Basophils Relative: 0.8 %
Eosinophils Absolute: 227 cells/uL (ref 15–500)
Eosinophils Relative: 3.2 %
HCT: 45.2 % (ref 38.5–50.0)
Hemoglobin: 15 g/dL (ref 13.2–17.1)
Lymphs Abs: 2151 cells/uL (ref 850–3900)
MCH: 29.8 pg (ref 27.0–33.0)
MCHC: 33.2 g/dL (ref 32.0–36.0)
MCV: 89.9 fL (ref 80.0–100.0)
MPV: 10.6 fL (ref 7.5–12.5)
Monocytes Relative: 8.6 %
Neutro Abs: 4054 cells/uL (ref 1500–7800)
Neutrophils Relative %: 57.1 %
Platelets: 207 10*3/uL (ref 140–400)
RBC: 5.03 10*6/uL (ref 4.20–5.80)
RDW: 14.8 % (ref 11.0–15.0)
Total Lymphocyte: 30.3 %
WBC: 7.1 10*3/uL (ref 3.8–10.8)

## 2021-08-14 LAB — COMPLETE METABOLIC PANEL WITH GFR
AG Ratio: 1.7 (calc) (ref 1.0–2.5)
ALT: 16 U/L (ref 9–46)
AST: 21 U/L (ref 10–35)
Albumin: 4.5 g/dL (ref 3.6–5.1)
Alkaline phosphatase (APISO): 81 U/L (ref 35–144)
BUN: 25 mg/dL (ref 7–25)
CO2: 26 mmol/L (ref 20–32)
Calcium: 9.2 mg/dL (ref 8.6–10.3)
Chloride: 105 mmol/L (ref 98–110)
Creat: 1.18 mg/dL (ref 0.70–1.22)
Globulin: 2.6 g/dL (calc) (ref 1.9–3.7)
Glucose, Bld: 89 mg/dL (ref 65–99)
Potassium: 4.2 mmol/L (ref 3.5–5.3)
Sodium: 140 mmol/L (ref 135–146)
Total Bilirubin: 0.8 mg/dL (ref 0.2–1.2)
Total Protein: 7.1 g/dL (ref 6.1–8.1)
eGFR: 61 mL/min/{1.73_m2} (ref >=60)

## 2021-08-18 ENCOUNTER — Telehealth: Payer: Self-pay | Admitting: Pharmacist

## 2021-08-18 ENCOUNTER — Other Ambulatory Visit: Payer: Self-pay | Admitting: Family Medicine

## 2021-08-18 NOTE — Progress Notes (Signed)
? ? ?Chronic Care Management ?Pharmacy Assistant  ? ?Name: KENDRIK MCSHAN  MRN: [id number] DOB: 1935-11-07 ? ? ?Reason for Encounter: Disease State - General Adherence Call  ?  ? ? ?Recent office visits:  ?08/13/21 Jenna Luo, MD - Family Medicine - COPD - Labs were ordered.  We will maximize his treatment for COPD by switching him to Trelegy 1 inhalation daily and discontinue the Anoro.  Continue furosemide to manage the leg swelling. Patient was given 1 month sample of Trelegy and will notify me if he feels like it is beneficial. Follow up as scheduled.  ? ? ?Recent consult visits:  ?06/25/21 Kirk Ruths, MD - Cardiology - Dyspnea on exertion - EKG performed. Stress test ordered. No medication changes. Follow up as needed.  ? ? ?Hospital visits:  02/24/21 - 02/25/21 ?Medication Reconciliation was completed by comparing discharge summary, patient?s EMR and Pharmacy list, and upon discussion with patient. ? ?Admitted to the hospital on 10/4/2 due to Left hip arthroplasty. Discharge date was 02/25/21. Discharged from [facility].   ? ?New?Medications Started at [facility] Discharge:?? ?docusate sodium (COLACE) ?HYDROcodone-acetaminophen (NORCO/VICODIN) ?methocarbamol (ROBAXIN) ?polyethylene glycol (MIRALAX / GLYCOLAX) ?rivaroxaban (XARELTO) ? ?Medication Changes at Hospital Discharge: ?None noted.  ? ?Medications Discontinued at Hospital Discharge: ?acetaminophen 650 MG CR tablet (TYLENOL) ?aspirin EC 81 MG tablet ?Emu Oil Oil ?Fish Oil 1000 MG Caps ? ?Medications that remain the same after Hospital Discharge:??  ?All other medications will remain the same.   ? ?Medications: ?Outpatient Encounter Medications as of 08/18/2021  ?Medication Sig  ? albuterol (VENTOLIN HFA) 108 (90 Base) MCG/ACT inhaler Inhale 1 to 2 puffs by mouth every 6 hours as needed for shortness of breath  ? amLODipine (NORVASC) 5 MG tablet Take 1 tablet by mouth at bedtime  ? docusate sodium (COLACE) 100 MG capsule Take 1 capsule (100 mg  total) by mouth 2 (two) times daily.  ? furosemide (LASIX) 40 MG tablet Take 1 tablet by mouth once daily  ? gabapentin (NEURONTIN) 300 MG capsule Take 1 capsule (300 mg total) by mouth 3 (three) times daily as needed (for sciatica).  ? HYDROcodone-acetaminophen (NORCO/VICODIN) 5-325 MG tablet Take 1 tablet by mouth every 4 (four) hours as needed.  ? meclizine (ANTIVERT) 25 MG tablet Take 1 tablet by mouth 3 times a day as needed  ? methocarbamol (ROBAXIN) 500 MG tablet Take 1 tablet (500 mg total) by mouth every 6 (six) hours as needed for muscle spasms.  ? moxifloxacin (VIGAMOX) 0.5 % ophthalmic solution Apply 1 drop to eye 4 (four) times daily.  ? Multiple Vitamin (MULTIVITAMIN WITH MINERALS) TABS Take 1 tablet by mouth daily.  ? ofloxacin (OCUFLOX) 0.3 % ophthalmic solution Place 1 drop into the left eye 4 (four) times daily.  ? pantoprazole (PROTONIX) 40 MG tablet Take 1 tablet by mouth every day  ? polyethylene glycol powder (GLYCOLAX/MIRALAX) 17 GM/SCOOP powder polyethylene glycol 3350 17 gram oral powder packet ? MIX 17 GM IN 2-4 OUNCES OF LIQUID AND DRINK BY MOUTH DAILY AS NEEDED FOR MILD CONSTIPATION.  ? prednisoLONE acetate (PRED FORTE) 1 % ophthalmic suspension Place 1 drop into the left eye 4 (four) times daily.  ? rivaroxaban (XARELTO) 10 MG TABS tablet Take 1 tablet (10 mg total) by mouth daily with breakfast for 14 days. Then take aspirin 81 mg twice daily for a month.  ? triamcinolone cream (KENALOG) 0.1 % Apply 1 application topically 2 (two) times daily as needed (irritation).  ? umeclidinium-vilanterol (ANORO ELLIPTA)  62.5-25 MCG/INH AEPB Inhale 1 puff into the lungs daily.  ? valsartan (DIOVAN) 80 MG tablet Take 1 tablet (80 mg total) by mouth daily.  ? zolpidem (AMBIEN) 10 MG tablet Take 1 tablet by mouth at bedtime as needed for sleep  ? ?No facility-administered encounter medications on file as of 08/18/2021.  ? ? ?Have you had any problems recently with your health? ? ? ?Have you had any  problems with your pharmacy? ? ? ?What issues or side effects are you having with your medications? ? ? ?What would you like me to pass along to Leata Mouse, CPP for them to help you with?  ? ? ?What can we do to take care of you better? ? ? ?Care Gaps ? ?AWV: done 03/26/21 ?Colonoscopy: done 12/27/13 ?DM Eye Exam: N/A ?DM Foot Exam:  N/A ?Microalbumin: unknown  ?HbgAIC: done 01/26/19 (6.0) ?DEXA:  N/A ?Mammogram: N/A ? ? ?Star Rating Drugs: ?valsartan (DIOVAN) 80 MG tablet - last filled 06/05/21 90 days  ? ? ?Future Appointments  ?Date Time Provider [place]  ?03/26/2022 11:15 AM [facility]-NURSE HEALTH ADVISOR [facility]  ? ?Multiple attempts were made to contact patient. Attempts were unsuccessful. / ls,CMA  ? ?Liza Showfety, CCMA ?Clinical Pharmacist Assistant  ?([phone]  ?

## 2021-08-27 DIAGNOSIS — H3581 Retinal edema: Secondary | ICD-10-CM | POA: Diagnosis not present

## 2021-08-27 DIAGNOSIS — H35372 Puckering of macula, left eye: Secondary | ICD-10-CM | POA: Diagnosis not present

## 2021-09-04 DIAGNOSIS — H4321 Crystalline deposits in vitreous body, right eye: Secondary | ICD-10-CM | POA: Diagnosis not present

## 2021-09-04 DIAGNOSIS — H35371 Puckering of macula, right eye: Secondary | ICD-10-CM | POA: Diagnosis not present

## 2021-09-04 DIAGNOSIS — H3581 Retinal edema: Secondary | ICD-10-CM | POA: Diagnosis not present

## 2021-09-06 IMAGING — CR DG CHEST 2V
2 series · 2 of 2 positions shown · non-contrast
Comparison: Chest radiograph dated 11/18/2019.

CLINICAL DATA: 83-year-old male with pneumonia.

EXAM:
CHEST - 2 VIEW

[w chest pa]
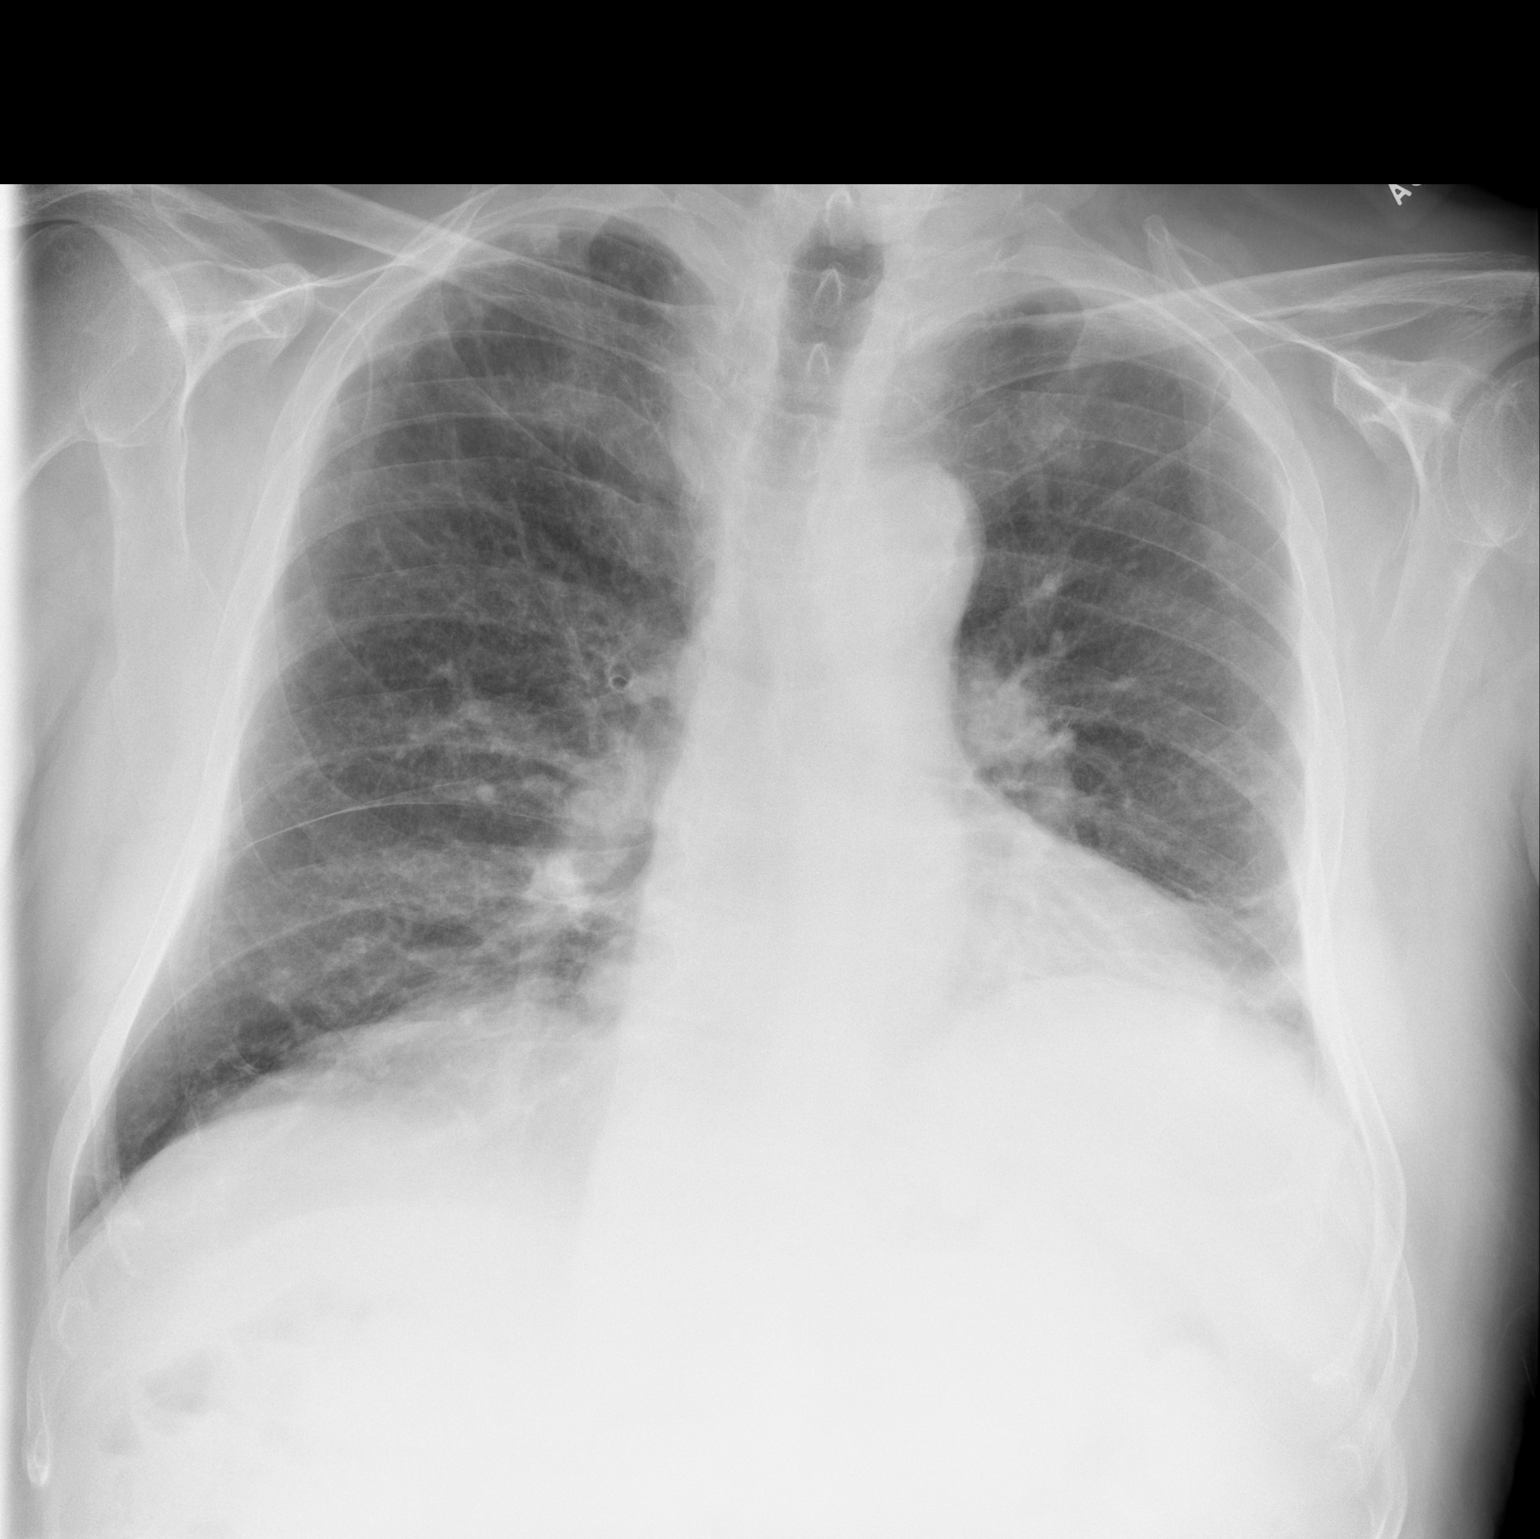

[w chest lat]
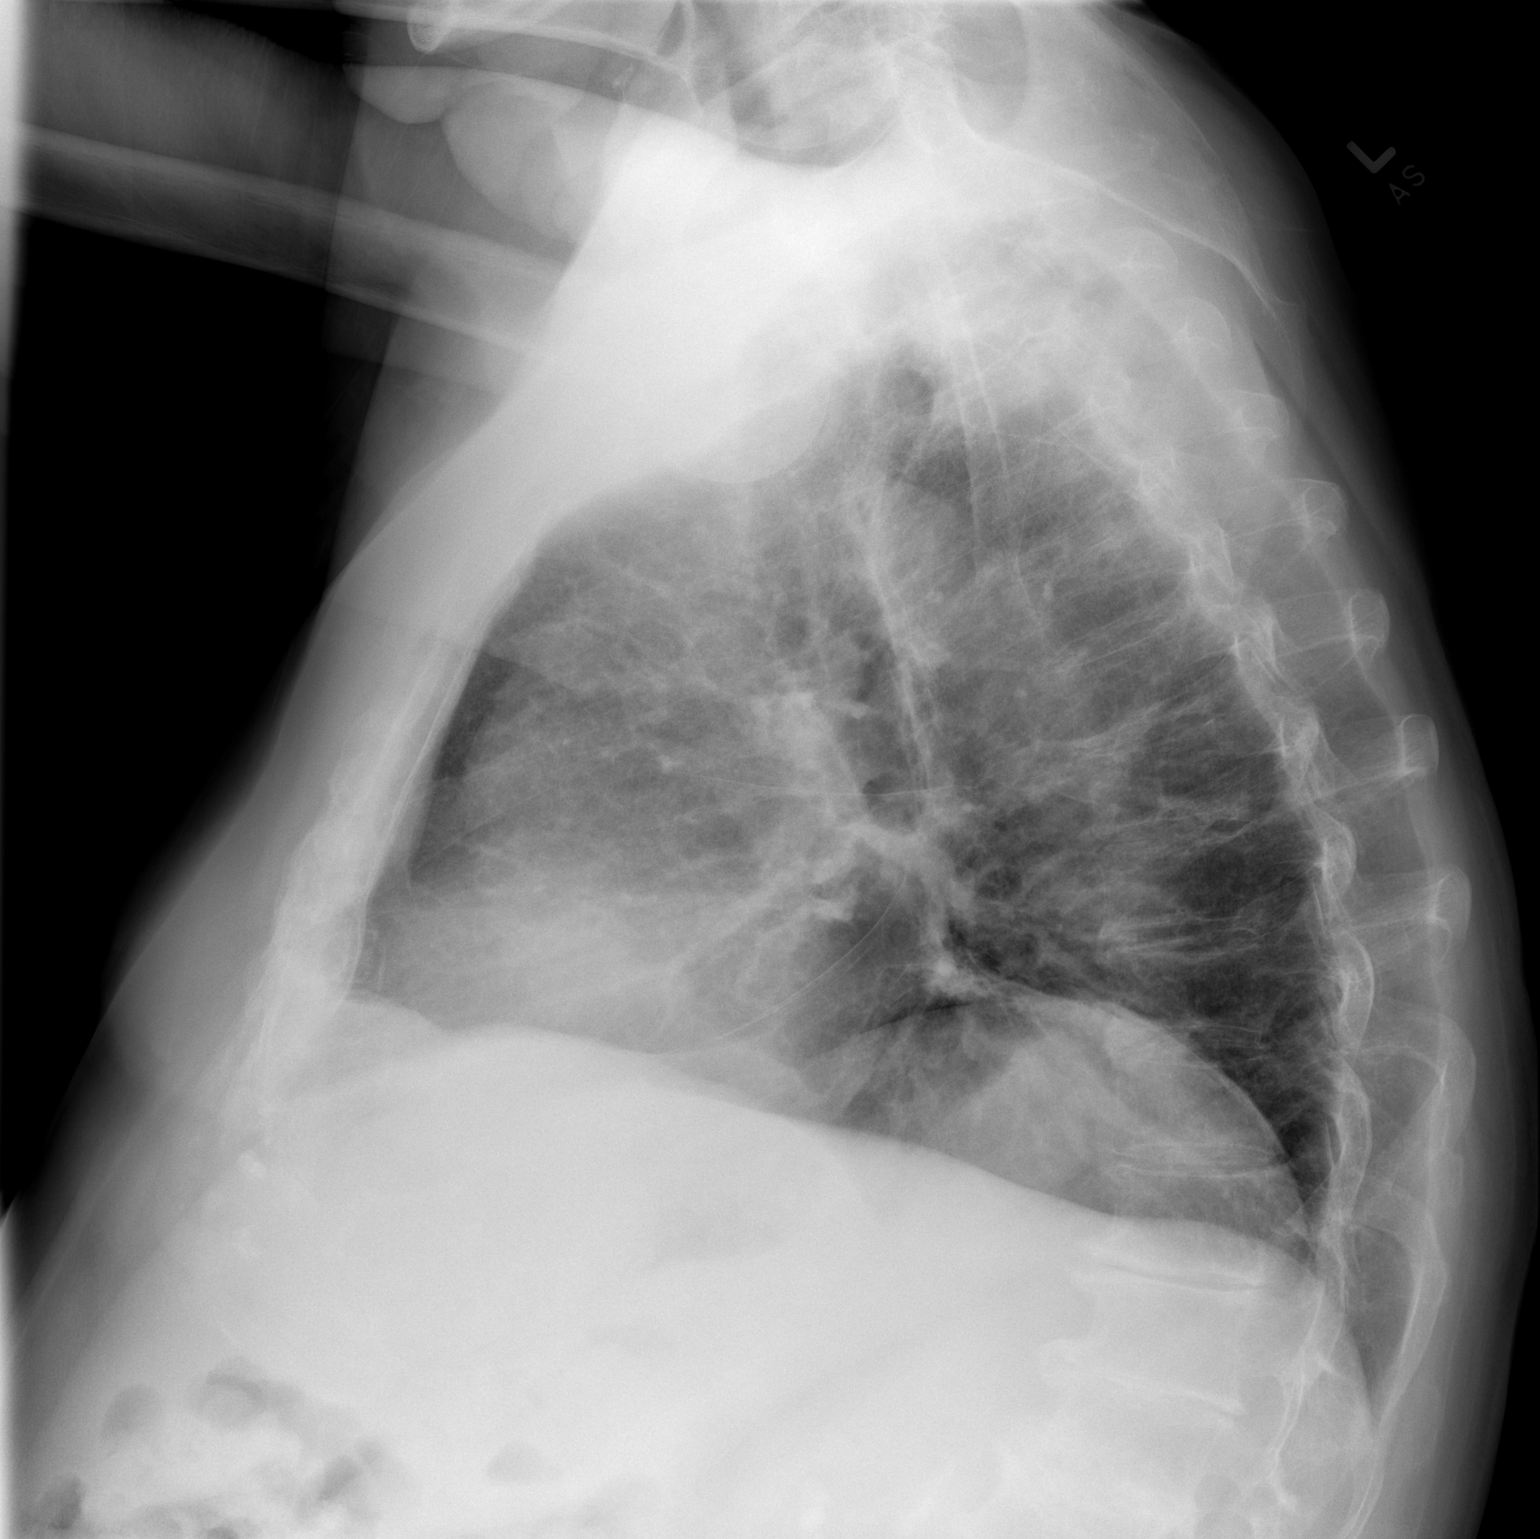

[2 of 2 positions shown; findings below may reference images not displayed]

FINDINGS: Bilateral lower lung field prominent densities similar or slightly
worsened since the prior radiograph and may represent pneumonia
superimposed on background of chronic interstitial lung disease.
Continued follow-up recommended. No large pleural effusion or
pneumothorax. Stable cardiac silhouette. Atherosclerotic
calcification of the aorta. No acute osseous pathology.
IMPRESSION: Bilateral lower lung field prominent densities similar or slightly
worsened since the prior radiograph. Clinical correlation and
continued follow-up recommended.

## 2021-09-14 ENCOUNTER — Telehealth: Payer: Self-pay

## 2021-09-16 ENCOUNTER — Other Ambulatory Visit: Payer: Self-pay

## 2021-09-16 MED ORDER — TRELEGY ELLIPTA 100-62.5-25 MCG/ACT IN AEPB: 1.0000 | INHALATION_SPRAY | Freq: Every day | RESPIRATORY_TRACT | 11 refills | Status: DC

## 2021-09-16 NOTE — Telephone Encounter (Signed)
Pt called in stating that he wanted to try a new med Trelegy that was mentioned to him from pcp. Pt is currently out of med that he was previously taking that Trelegy would replace. Pt states that he would be able to come and get sample of Trelegy if office has any available. Please call pt if samples are available. Please advise. ? ?Cb#: [phone] ?

## 2021-09-16 NOTE — Telephone Encounter (Signed)
Rx Trelegy sent to pharmacy  ? ?Spoke with pt today re above med. Pt stated that the samples given per dr had worked well. ?

## 2021-10-02 DIAGNOSIS — H3581 Retinal edema: Secondary | ICD-10-CM | POA: Diagnosis not present

## 2021-10-02 DIAGNOSIS — H43821 Vitreomacular adhesion, right eye: Secondary | ICD-10-CM | POA: Diagnosis not present

## 2021-10-02 DIAGNOSIS — H35371 Puckering of macula, right eye: Secondary | ICD-10-CM | POA: Diagnosis not present

## 2021-10-14 DIAGNOSIS — H2511 Age-related nuclear cataract, right eye: Secondary | ICD-10-CM | POA: Diagnosis not present

## 2021-10-14 DIAGNOSIS — H35373 Puckering of macula, bilateral: Secondary | ICD-10-CM | POA: Diagnosis not present

## 2021-11-04 ENCOUNTER — Telehealth: Payer: Self-pay | Admitting: Pharmacist

## 2021-11-04 NOTE — Progress Notes (Signed)
Chronic Care Management Pharmacy Assistant   Name: Brendan Hopkins  MRN: [id number] DOB: [birth year]   Reason for Encounter: Hypertension Adherence Call    Recent office visits:  None since last Adherence Call  Recent consult visits:  None since last Adherence Call  Hospital visits:  None in previous 6 months  Medications: Outpatient Encounter Medications as of 11/04/2021  Medication Sig   albuterol (VENTOLIN HFA) 108 (90 Base) MCG/ACT inhaler Inhale 1 to 2 puffs by mouth every 6 hours as needed for shortness of breath   amLODipine (NORVASC) 5 MG tablet Take 1 tablet by mouth at bedtime   docusate sodium (COLACE) 100 MG capsule Take 1 capsule (100 mg total) by mouth 2 (two) times daily.   Fluticasone-Umeclidin-Vilant (TRELEGY ELLIPTA) 100-62.5-25 MCG/ACT AEPB Inhale 1 puff into the lungs daily.   furosemide (LASIX) 40 MG tablet Take 1 tablet by mouth once daily   gabapentin (NEURONTIN) 300 MG capsule Take 1 capsule (300 mg total) by mouth 3 (three) times daily as needed (for sciatica).   HYDROcodone-acetaminophen (NORCO/VICODIN) 5-325 MG tablet Take 1 tablet by mouth every 4 (four) hours as needed.   meclizine (ANTIVERT) 25 MG tablet Take 1 tablet by mouth 3 times a day as needed   methocarbamol (ROBAXIN) 500 MG tablet Take 1 tablet (500 mg total) by mouth every 6 (six) hours as needed for muscle spasms.   moxifloxacin (VIGAMOX) 0.5 % ophthalmic solution Apply 1 drop to eye 4 (four) times daily.   Multiple Vitamin (MULTIVITAMIN WITH MINERALS) TABS Take 1 tablet by mouth daily.   ofloxacin (OCUFLOX) 0.3 % ophthalmic solution Place 1 drop into the left eye 4 (four) times daily.   pantoprazole (PROTONIX) 40 MG tablet Take 1 tablet by mouth every day   polyethylene glycol powder (GLYCOLAX/MIRALAX) 17 GM/SCOOP powder polyethylene glycol 3350 17 gram oral powder packet  MIX 17 GM IN 2-4 OUNCES OF LIQUID AND DRINK BY MOUTH DAILY AS NEEDED FOR MILD CONSTIPATION.   prednisoLONE  acetate (PRED FORTE) 1 % ophthalmic suspension Place 1 drop into the left eye 4 (four) times daily.   rivaroxaban (XARELTO) 10 MG TABS tablet Take 1 tablet (10 mg total) by mouth daily with breakfast for 14 days. Then take aspirin 81 mg twice daily for a month.   triamcinolone cream (KENALOG) 0.1 % Apply 1 application topically 2 (two) times daily as needed (irritation).   umeclidinium-vilanterol (ANORO ELLIPTA) 62.5-25 MCG/INH AEPB Inhale 1 puff into the lungs daily.   valsartan (DIOVAN) 80 MG tablet Take 1 tablet (80 mg total) by mouth daily.   zolpidem (AMBIEN) 10 MG tablet Take 1 tablet by mouth at bedtime as needed for sleep   No facility-administered encounter medications on file as of 11/04/2021.   Reviewed chart prior to disease state call. Spoke with patient regarding BP  Recent Office Vitals: BP Readings from Last 3 Encounters:  08/13/21 128/82  07/21/21 123/82  07/17/21 138/78   Pulse Readings from Last 3 Encounters:  08/13/21 70  07/21/21 71  07/17/21 67    Wt Readings from Last 3 Encounters:  08/13/21 254 lb 12.8 oz (115.6 kg)  07/23/21 254 lb (115.2 kg)  07/21/21 254 lb 9.6 oz (115.5 kg)     Kidney Function Lab Results  Component Value Date/Time   CREATININE 1.18 08/13/2021 12:03 PM   CREATININE 1.13 03/05/2021 12:32 PM   GFRNONAA >60 02/25/2021 03:36 AM   GFRNONAA 67 05/06/2020 12:46 PM   GFRAA 77 05/06/2020 12:46 PM  Latest Ref Rng & Units 08/13/2021   12:03 PM 03/05/2021   12:32 PM 02/25/2021    3:36 AM  BMP  Glucose 65 - 99 mg/dL 89  105  163   BUN 7 - 25 mg/dL '25  25  21   '$ Creatinine 0.70 - 1.22 mg/dL 1.18  1.13  1.17   BUN/Creat Ratio 6 - 22 (calc) NOT APPLICABLE  NOT APPLICABLE    Sodium 233 - 146 mmol/L 140  138  135   Potassium 3.5 - 5.3 mmol/L 4.2  4.3  4.2   Chloride 98 - 110 mmol/L 105  103  103   CO2 20 - 32 mmol/L '26  25  24   '$ Calcium 8.6 - 10.3 mg/dL 9.2  8.7  8.3     Current antihypertensive regimen:  Amlodipine 5 mg  daily Furosemide 40 mg daily Valsartan 80 mg daily  How often are you checking your Blood Pressure? weekly  Current home BP readings: 140/85-90  What recent interventions/DTPs have been made by any provider to improve Blood Pressure control since last CPP Visit: No recent interventions or DTPs.  Any recent hospitalizations or ED visits since last visit with CPP? No  What diet changes have been made to improve Blood Pressure Control?  No recent diet changes noted.  What exercise is being done to improve your Blood Pressure Control?  Patient states he doesn't exercise due to pain.  Adherence Review: Is the patient currently on ACE/ARB medication? Yes Does the patient have >5 day gap between last estimated fill dates? No  -Patient states Trelegy is working well for his breathing. 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 He states he doesn't get a short of breath as much.  -Patient declines to schedule a follow up telephone call at this time. He states he will schedule an appointment after he gets his eyes checked.  Care Gaps: Medicare Annual Wellness: Completed 03/26/2021 Hemoglobin A1C: 6.0% on 01/26/2019 Colonoscopy: Completed 12/27/2013  Future Appointments  Date Time Provider [place]  03/26/2022 11:15 AM [facility]-NURSE HEALTH ADVISOR [facility]   Star Rating Drugs: Valsartan 80 mg last filled 09/03/2021 90 DS  April D Calhoun, [place] Pharmacist Assistant [phone]

## 2021-11-05 DIAGNOSIS — L814 Other melanin hyperpigmentation: Secondary | ICD-10-CM | POA: Diagnosis not present

## 2021-11-05 DIAGNOSIS — D225 Melanocytic nevi of trunk: Secondary | ICD-10-CM | POA: Diagnosis not present

## 2021-11-05 DIAGNOSIS — L821 Other seborrheic keratosis: Secondary | ICD-10-CM | POA: Diagnosis not present

## 2021-11-05 DIAGNOSIS — L570 Actinic keratosis: Secondary | ICD-10-CM | POA: Diagnosis not present

## 2021-11-10 DIAGNOSIS — H2511 Age-related nuclear cataract, right eye: Secondary | ICD-10-CM | POA: Diagnosis not present

## 2021-11-10 DIAGNOSIS — H21561 Pupillary abnormality, right eye: Secondary | ICD-10-CM | POA: Diagnosis not present

## 2021-11-10 DIAGNOSIS — H269 Unspecified cataract: Secondary | ICD-10-CM | POA: Diagnosis not present

## 2021-11-17 ENCOUNTER — Other Ambulatory Visit: Payer: Self-pay | Admitting: Family Medicine

## 2021-11-20 ENCOUNTER — Ambulatory Visit (INDEPENDENT_AMBULATORY_CARE_PROVIDER_SITE_OTHER): Payer: Medicare Other | Admitting: Family Medicine

## 2021-11-20 VITALS — BP 115/60 | HR 68 | Temp 98.5°F | Ht 71.0 in | Wt 250.0 lb

## 2021-11-20 DIAGNOSIS — G253 Myoclonus: Secondary | ICD-10-CM

## 2021-11-20 MED ORDER — POTASSIUM CHLORIDE CRYS ER 20 MEQ PO TBCR: 20.0000 meq | EXTENDED_RELEASE_TABLET | Freq: Every day | ORAL | 0 refills | Status: DC

## 2021-11-20 NOTE — Progress Notes (Signed)
Subjective:    Patient ID: Brendan Hopkins, male    DOB: September 20, 1935, 86 y.o.   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  They have been replacing the toilet.  He was sweating profusely yesterday due to how hot and humid it was in the work environment.  He is also taking Lasix for leg swelling without potassium.  Starting last night he developed involuntary muscle twitches.  The muscles in his face will twitch.  His arm will twitch.  These are not fasciculations with true myoclonic jerks.  Occasionally his arm will suddenly contract and jerk without warning and beyond his control.  It is keeping him awake.  It seems to be limited to his shoulders and arms and occasionally his face.  It occurs bilaterally.  He denies any headache, slurred speech, facial droop arm weakness or leg weakness.  There is no neurologic deficit on exam today however patient does have some mild hyperreflexia on reflex examination Past Medical History:  Diagnosis Date   Acute deep vein thrombosis (DVT) of tibial vein of right lower extremity (HCC)    after ankle fracture/surgery (2/21)   Arthritis    RIGHT HIP, HANDS, BACK   Cancer (HCC)    Skin cancer    Cataract    Cervical radiculopathy    severe C3-4 neuroforaminal stenosis, C5-6 left synovial cyst compressing left nerve root.   Colon polyps    2005   COPD (chronic obstructive pulmonary disease) ([place])    SMOKED FOR 74 YRS   Diverticulosis    2005   Fatty liver    GERD (gastroesophageal reflux disease)    Hard of hearing    History of kidney stones    Hypertension    Leg fracture, right    Pain    RIGHT HIP AND BACK   Personal history of colonic polyps-adenoma and sessile serrated polyp 07/15/2010   Pneumonia 03/2019   RBBB    POSS HX AF IN PAST- PAC'S   Shortness of breath    WITH EXERTION ONLY   Stroke ([place])    TIA   TIA (transient ischemic attack)    Wears glasses    Past Surgical History:   Procedure Laterality Date   COLONOSCOPY     maybe 3    COLONOSCOPY W/ POLYPECTOMY     CYSTOSCOPY WITH RETROGRADE PYELOGRAM, URETEROSCOPY AND STENT PLACEMENT Left 12/11/2019   Procedure: CYSTOSCOPY WITH LEFT RETROGRADE LEFT  URETEROSCOPY WITH HOLMIUM LASER AND STENT PLACEMENT;  Surgeon: Irine Seal, MD;  Location: [facility];  Service: Urology;  Laterality: Left;   ORIF ANKLE FRACTURE Right 07/11/2019   ORIF ANKLE FRACTURE Right 07/11/2019   Procedure: OPEN REDUCTION INTERNAL FIXATION (ORIF) ANKLE FRACTURE;  Surgeon: Shona Needles, MD;  Location: [facility];  Service: Orthopedics;  Laterality: Right;   skin cancers removed      TOTAL HIP ARTHROPLASTY Right 01/09/2013   Procedure: RIGHT TOTAL HIP ARTHROPLASTY ANTERIOR APPROACH;  Surgeon: [name], MD;  Location: [facility];  Service: Orthopedics;  Laterality: Right;   TOTAL HIP ARTHROPLASTY Left 02/24/2021   Procedure: TOTAL HIP ARTHROPLASTY ANTERIOR APPROACH;  Surgeon: Paralee Cancel, MD;  Location: [facility];  Service: Orthopedics;  Laterality: Left;       No Known Allergies Social History   Socioeconomic History   Marital status: Widowed    Spouse name: Not on file   Number of children: 3   Years of education: 80  Highest education level: Not on file  Occupational History   Occupation: Retired  Tobacco Use   Smoking status: Former    Years: 57.00    Types: Cigarettes    Quit date: 05/25/2007    Years since quitting: 14.5   Smokeless tobacco: Never   Tobacco comments:    Does not qualify due to age.   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   Social Determinants of Health   Financial Resource Strain: Low Risk  (03/26/2021)   Overall Financial Resource Strain  (CARDIA)    Difficulty of Paying Living Expenses: Not hard at all  Food Insecurity: No Food Insecurity (03/26/2021)   Hunger Vital Sign    Worried About Running Out of Food in the Last Year: Never true    Ran Out of Food in the Last Year: Never true  Transportation Needs: No Transportation Needs (03/26/2021)   PRAPARE - Hydrologist (Medical): No    Lack of Transportation (Non-Medical): No  Physical Activity: Insufficiently Active (03/26/2021)   Exercise Vital Sign    Days of Exercise per Week: 3 days    Minutes of Exercise per Session: 30 min  Stress: No Stress Concern Present (03/26/2021)   [place]    Feeling of Stress : Not at all  Social Connections: Moderately Integrated (03/26/2021)   Social Connection and Isolation Panel [NHANES]    Frequency of Communication with Friends and Family: More than three times a week    Frequency of Social Gatherings with Friends and Family: More than three times a week    Attends Religious Services: More than 4 times per year    Active Member of [employer] or Organizations: Yes    Attends Archivist Meetings: More than 4 times per year    Marital Status: Widowed  Intimate Partner Violence: Not At Risk (03/26/2021)   Humiliation, Afraid, Rape, and Kick questionnaire    Fear of Current or Ex-Partner: No    Emotionally Abused: No    Physically Abused: No    Sexually Abused: No    Review of Systems     Objective:   Physical Exam Vitals reviewed.  Constitutional:      General: He is not in acute distress.    Appearance: Normal appearance. He is normal weight. He is not ill-appearing or toxic-appearing.  Eyes:     General: No visual field deficit. Cardiovascular:     Rate and Rhythm: Normal rate and regular rhythm.     Heart sounds: Normal heart sounds.  Pulmonary:     Effort: Pulmonary effort is normal. No respiratory distress.     Breath  sounds: Decreased air movement present. No stridor. Decreased breath sounds present. No wheezing, rhonchi or rales.  Chest:     Chest wall: No tenderness.  Musculoskeletal:     Right lower leg: No edema.     Left lower leg: No edema.  Skin:    Findings: No erythema or rash.  Neurological:     Mental Status: He is alert and oriented to person, place, and time.  Cranial Nerves: Cranial nerves 2-12 are intact. No cranial nerve deficit, dysarthria or facial asymmetry.     Sensory: Sensation is intact.     Motor: No weakness, tremor, atrophy, abnormal muscle tone, seizure activity or pronator drift.     Coordination: Coordination is intact.     Gait: Gait is intact.     Deep Tendon Reflexes:     Reflex Scores:      Bicep reflexes are 3+ on the right side and 3+ on the left side.      Brachioradialis reflexes are 3+ on the right side and 3+ on the left side.          Assessment & Plan:  Myoclonic jerking - Plan: BASIC METABOLIC PANEL WITH GFR, Magnesium, CBC with Differential/Platelet Patient is not having shaking chills due to a fever.  He is not having seizure activity.  These are myoclonic jerks.  I suspect this is due to some type of electrolyte disturbance most likely due to low potassium from sweating and furosemide.  I recommend stopping furosemide, pushing Gatorade, and I will start the patient on K-Dur 20 mill equivalents once a day while awaiting for results of his lab work including CBC, CMP, and magnesium level.  If symptoms worsen or change she is to seek medical attention immediately

## 2021-11-21 LAB — BASIC METABOLIC PANEL WITH GFR
BUN/Creatinine Ratio: 18 (calc) (ref 6–22)
BUN: 30 mg/dL — ABNORMAL HIGH (ref 7–25)
CO2: 26 mmol/L (ref 20–32)
Calcium: 9.5 mg/dL (ref 8.6–10.3)
Chloride: 106 mmol/L (ref 98–110)
Creat: 1.67 mg/dL — ABNORMAL HIGH (ref 0.70–1.22)
Glucose, Bld: 108 mg/dL — ABNORMAL HIGH (ref 65–99)
Potassium: 4.2 mmol/L (ref 3.5–5.3)
Sodium: 140 mmol/L (ref 135–146)
eGFR: 40 mL/min/{1.73_m2} — ABNORMAL LOW (ref >=60)

## 2021-11-21 LAB — CBC WITH DIFFERENTIAL/PLATELET
Absolute Monocytes: 783 cells/uL (ref 200–950)
Basophils Absolute: 73 cells/uL (ref 0–200)
Basophils Relative: 0.8 %
Eosinophils Absolute: 264 cells/uL (ref 15–500)
Eosinophils Relative: 2.9 %
HCT: 44.8 % (ref 38.5–50.0)
Hemoglobin: 15.1 g/dL (ref 13.2–17.1)
Lymphs Abs: 2430 cells/uL (ref 850–3900)
MCH: 31.3 pg (ref 27.0–33.0)
MCHC: 33.7 g/dL (ref 32.0–36.0)
MCV: 92.8 fL (ref 80.0–100.0)
MPV: 10.7 fL (ref 7.5–12.5)
Monocytes Relative: 8.6 %
Neutro Abs: 5551 cells/uL (ref 1500–7800)
Neutrophils Relative %: 61 %
Platelets: 212 10*3/uL (ref 140–400)
RBC: 4.83 10*6/uL (ref 4.20–5.80)
RDW: 13.2 % (ref 11.0–15.0)
Total Lymphocyte: 26.7 %
WBC: 9.1 10*3/uL (ref 3.8–10.8)

## 2021-11-21 LAB — MAGNESIUM: Magnesium: 2.2 mg/dL (ref 1.5–2.5)

## 2021-12-07 ENCOUNTER — Other Ambulatory Visit: Payer: Self-pay | Admitting: Family Medicine

## 2021-12-07 DIAGNOSIS — I10 Essential (primary) hypertension: Secondary | ICD-10-CM

## 2021-12-08 NOTE — Telephone Encounter (Signed)
Requested Prescriptions  Pending Prescriptions Disp Refills  . valsartan (DIOVAN) 80 MG tablet [Pharmacy Med Name: Valsartan 80 MG Oral Tablet] 90 tablet 0    Sig: Take 1 tablet by mouth once daily     Cardiovascular:  Angiotensin Receptor Blockers Failed - 12/07/2021 12:02 PM      Failed - Cr in normal range and within 180 days    Creat  Date Value Ref Range Status  11/20/2021 1.67 (H) 0.70 - 1.22 mg/dL Final         Passed - K in normal range and within 180 days    Potassium  Date Value Ref Range Status  11/20/2021 4.2 3.5 - 5.3 mmol/L Final         Passed - Patient is not pregnant      Passed - Last BP in normal range    BP Readings from Last 1 Encounters:  11/20/21 115/60         Passed - Valid encounter within last 6 months    Recent Outpatient Visits          3 months ago Chronic obstructive pulmonary disease, unspecified COPD type ([place])   [place] Pickard, Cammie Mcgee, MD   4 months ago Left sided sciatica   [place] [facility] Schaumann, Cammie Mcgee, MD   6 months ago [place] [name], MD   9 months ago S/P total left hip arthroplasty   [place] [name], MD   1 year ago Chronic obstructive pulmonary disease, unspecified COPD type ([place])   [facility] Pickard, Cammie Mcgee, MD

## 2021-12-11 DIAGNOSIS — H35371 Puckering of macula, right eye: Secondary | ICD-10-CM | POA: Diagnosis not present

## 2021-12-11 DIAGNOSIS — H4321 Crystalline deposits in vitreous body, right eye: Secondary | ICD-10-CM | POA: Diagnosis not present

## 2021-12-11 DIAGNOSIS — H43821 Vitreomacular adhesion, right eye: Secondary | ICD-10-CM | POA: Diagnosis not present

## 2021-12-11 DIAGNOSIS — H3581 Retinal edema: Secondary | ICD-10-CM | POA: Diagnosis not present

## 2021-12-17 DIAGNOSIS — H35371 Puckering of macula, right eye: Secondary | ICD-10-CM | POA: Diagnosis not present

## 2021-12-17 DIAGNOSIS — H3581 Retinal edema: Secondary | ICD-10-CM | POA: Diagnosis not present

## 2021-12-18 DIAGNOSIS — H3581 Retinal edema: Secondary | ICD-10-CM | POA: Diagnosis not present

## 2021-12-25 DIAGNOSIS — H3581 Retinal edema: Secondary | ICD-10-CM | POA: Diagnosis not present

## 2021-12-31 ENCOUNTER — Other Ambulatory Visit: Payer: Self-pay | Admitting: Family Medicine

## 2021-12-31 NOTE — Telephone Encounter (Signed)
Requested medication (s) are due for refill today: no  Requested medication (s) are on the active medication list: yes  Last refill:  07/30/21 #30 5 RF  Future visit scheduled: no  Notes to clinic:  med not delegated to NT to RF to RF   Requested Prescriptions  Pending Prescriptions Disp Refills   zolpidem (AMBIEN) 10 MG tablet [Pharmacy Med Name: Zolpidem Tartrate '10mg'$  Tablet] 30 tablet 5    Sig: Take 1 tablet by mouth at bedtime as needed for sleep     Not Delegated - Psychiatry:  Anxiolytics/Hypnotics Failed - 12/31/2021  2:08 AM      Failed - This refill cannot be delegated      Failed - Urine Drug Screen completed in last 360 days      Passed - Valid encounter within last 6 months    Recent Outpatient Visits           4 months ago Chronic obstructive pulmonary disease, unspecified COPD type ([place])   [place] Pickard, Cammie Mcgee, MD   5 months ago Left sided sciatica   [place] [facility] Schaumann, Cammie Mcgee, MD   7 months ago [place] [name], MD   10 months ago S/P total left hip arthroplasty   [place] [name], MD   1 year ago Chronic obstructive pulmonary disease, unspecified COPD type ([place])   [facility] Pickard, Cammie Mcgee, MD

## 2022-01-05 ENCOUNTER — Telehealth: Payer: Self-pay | Admitting: Pharmacist

## 2022-01-05 NOTE — Progress Notes (Unsigned)
Chronic Care Management Pharmacy Assistant   Name: Brendan Hopkins  MRN: [id number] DOB: 1935-06-06  Reason for Encounter: COPD Adherence Call    Recent office visits:  11/20/2021 OV (PCP) Brendan [name], MD;  I recommend stopping furosemide, pushing Gatorade, and I will start the patient on K-Dur 20 mill equivalents once a day while awaiting for results of his lab work including CBC, CMP, and magnesium level.  Recent consult visits:  None  Hospital visits:  None in previous 6 months  Medications: Outpatient Encounter Medications as of 01/05/2022  Medication Sig   albuterol (VENTOLIN HFA) 108 (90 Base) MCG/ACT inhaler Inhale 1 to 2 puffs by mouth every 6 hours as needed for shortness of breath (Patient not taking: Reported on 11/20/2021)   amLODipine (NORVASC) 5 MG tablet Take 1 tablet by mouth at bedtime   docusate sodium (COLACE) 100 MG capsule Take 1 capsule (100 mg total) by mouth 2 (two) times daily. (Patient not taking: Reported on 11/20/2021)   Fluticasone-Umeclidin-Vilant (TRELEGY ELLIPTA) 100-62.5-25 MCG/ACT AEPB Inhale 1 puff into the lungs daily.   furosemide (LASIX) 40 MG tablet Take 1 tablet by mouth once daily   gabapentin (NEURONTIN) 300 MG capsule TAKE 1 CAPSULE BY MOUTH THREE TIMES DAILY AS NEEDED (FOR  SCIATICA)   HYDROcodone-acetaminophen (NORCO/VICODIN) 5-325 MG tablet Take 1 tablet by mouth every 4 (four) hours as needed.   meclizine (ANTIVERT) 25 MG tablet Take 1 tablet by mouth 3 times a day as needed   methocarbamol (ROBAXIN) 500 MG tablet Take 1 tablet (500 mg total) by mouth every 6 (six) hours as needed for muscle spasms. (Patient not taking: Reported on 11/20/2021)   moxifloxacin (VIGAMOX) 0.5 % ophthalmic solution Apply 1 drop to eye 4 (four) times daily.   Multiple Vitamin (MULTIVITAMIN WITH MINERALS) TABS Take 1 tablet by mouth daily.   ofloxacin (OCUFLOX) 0.3 % ophthalmic solution Place 1 drop into the left eye 4 (four) times daily.   pantoprazole  (PROTONIX) 40 MG tablet Take 1 tablet by mouth every day   polyethylene glycol powder (GLYCOLAX/MIRALAX) 17 GM/SCOOP powder polyethylene glycol 3350 17 gram oral powder packet  MIX 17 GM IN 2-4 OUNCES OF LIQUID AND DRINK BY MOUTH DAILY AS NEEDED FOR MILD CONSTIPATION. (Patient not taking: Reported on 11/20/2021)   potassium chloride SA (KLOR-CON M) 20 MEQ tablet Take 1 tablet (20 mEq total) by mouth daily.   prednisoLONE acetate (PRED FORTE) 1 % ophthalmic suspension Place 1 drop into the left eye 4 (four) times daily. (Patient not taking: Reported on 11/20/2021)   triamcinolone cream (KENALOG) 0.1 % Apply 1 application topically 2 (two) times daily as needed (irritation).   umeclidinium-vilanterol (ANORO ELLIPTA) 62.5-25 MCG/INH AEPB Inhale 1 puff into the lungs daily. (Patient not taking: Reported on 11/20/2021)   valsartan (DIOVAN) 80 MG tablet Take 1 tablet by mouth once daily   zolpidem (AMBIEN) 10 MG tablet Take 1 tablet by mouth at bedtime as needed for sleep   No facility-administered encounter medications on file as of 01/05/2022.   Current COPD regimen: Albuterol inhaler, Trelegy Ellipta     No data to display         Any recent hospitalizations or ED visits since last visit with CPP? {yes/no:20286} Reports***denies COPD symptoms, including {COPD Symptoms:24140} What recent interventions/DTPs have been made by any provider to improve breathing since last visit: Have you had exacerbation/flare-up since last visit? {yes/no:20286} What do you do when you are short of breath?  {SOBresposne:24139}***  Respiratory Devices/Equipment Do you have a nebulizer? {yes/no:20286} Do you use a Peak Flow Meter? {yes/no:20286} Do you use a maintenance inhaler? {yes/no:20286} How often do you forget to use your daily inhaler? *** Do you use a rescue inhaler? {yes/no:20286} How often do you use your rescue inhaler?  {CHL HP [employer] Inhaler JDYN:[id number]} Do you use a spacer with your  inhaler? {yes/no:20286}  Adherence Review: Does the patient have >5 day gap between last estimated fill date for maintenance inhaler medications? {yes/no:20286}  Care Gaps: Medicare Annual Wellness: Completed 03/26/2021 Hemoglobin A1C: 6.0% on 01/26/2019 Colonoscopy: Aged out  Future Appointments  Date Time Provider [place]  03/26/2022 11:15 AM [facility]-NURSE HEALTH ADVISOR [facility]   Star Rating Drugs: Valsartan 80 mg last filled 12/08/2021 90 DS  April D Calhoun, [place] Pharmacist Assistant [phone]

## 2022-01-15 ENCOUNTER — Telehealth: Payer: Self-pay

## 2022-01-15 ENCOUNTER — Ambulatory Visit (INDEPENDENT_AMBULATORY_CARE_PROVIDER_SITE_OTHER): Payer: Medicare Other | Admitting: Family Medicine

## 2022-01-15 VITALS — BP 118/68 | HR 43 | Temp 97.7°F | Ht 71.0 in | Wt 247.0 lb

## 2022-01-15 DIAGNOSIS — I4891 Unspecified atrial fibrillation: Secondary | ICD-10-CM | POA: Diagnosis not present

## 2022-01-15 DIAGNOSIS — I499 Cardiac arrhythmia, unspecified: Secondary | ICD-10-CM | POA: Diagnosis not present

## 2022-01-15 MED ORDER — RIVAROXABAN 20 MG PO TABS: 20.0000 mg | ORAL_TABLET | Freq: Every day | ORAL | 5 refills | Status: DC

## 2022-01-15 MED ORDER — HYDROCODONE BIT-HOMATROP MBR 5-1.5 MG/5ML PO SOLN: 5.0000 mL | Freq: Three times a day (TID) | ORAL | 0 refills | Status: DC | PRN

## 2022-01-15 MED ORDER — METOPROLOL SUCCINATE ER 25 MG PO TB24: 25.0000 mg | ORAL_TABLET | Freq: Every day | ORAL | 3 refills | Status: DC

## 2022-01-15 NOTE — Progress Notes (Signed)
Subjective:    Patient ID: Brendan Hopkins, male    DOB: 09/14/1935, 86 y.o.   MRN: [id number]  Cough  Patient came in because he had a cough for 2 weeks.  He denies any fevers or chills or chest pain.  He denies any shortness of breath or pleurisy.  However on examination today he has an irregularly irregular heart rate more than his baseline.  He has a history of PACs but this seems much exaggerated.  Patient states he is unable to feel them. Past Medical History:  Diagnosis Date   Acute deep vein thrombosis (DVT) of tibial vein of right lower extremity (HCC)    after ankle fracture/surgery (2/21)   Arthritis    RIGHT HIP, HANDS, BACK   Cancer (HCC)    Skin cancer    Cataract    Cervical radiculopathy    severe C3-4 neuroforaminal stenosis, C5-6 left synovial cyst compressing left nerve root.   Colon polyps    2005   COPD (chronic obstructive pulmonary disease) ([place])    SMOKED FOR 49 YRS   Diverticulosis    2005   Fatty liver    GERD (gastroesophageal reflux disease)    Hard of hearing    History of kidney stones    Hypertension    Leg fracture, right    Pain    RIGHT HIP AND BACK   Personal history of colonic polyps-adenoma and sessile serrated polyp 07/15/2010   Pneumonia 03/2019   RBBB    POSS HX AF IN PAST- PAC'S   Shortness of breath    WITH EXERTION ONLY   Stroke ([place])    TIA   TIA (transient ischemic attack)    Wears glasses    Past Surgical History:  Procedure Laterality Date   COLONOSCOPY     maybe 3    COLONOSCOPY W/ POLYPECTOMY     CYSTOSCOPY WITH RETROGRADE PYELOGRAM, URETEROSCOPY AND STENT PLACEMENT Left 12/11/2019   Procedure: CYSTOSCOPY WITH LEFT RETROGRADE LEFT  URETEROSCOPY WITH HOLMIUM LASER AND STENT PLACEMENT;  Surgeon: Irine Seal, MD;  Location: [facility];  Service: Urology;  Laterality: Left;   ORIF ANKLE FRACTURE Right 07/11/2019   ORIF ANKLE FRACTURE Right 07/11/2019   Procedure: OPEN REDUCTION INTERNAL FIXATION (ORIF) ANKLE FRACTURE;   Surgeon: Shona Needles, MD;  Location: [facility];  Service: Orthopedics;  Laterality: Right;   skin cancers removed      TOTAL HIP ARTHROPLASTY Right 01/09/2013   Procedure: RIGHT TOTAL HIP ARTHROPLASTY ANTERIOR APPROACH;  Surgeon: [name], MD;  [facility];  Service: Orthopedics;  Laterality: Right;   TOTAL HIP ARTHROPLASTY Left 02/24/2021   Procedure: TOTAL HIP ARTHROPLASTY ANTERIOR APPROACH;  Surgeon: Paralee Cancel, MD;  [facility];  Service: Orthopedics;  Laterality: Left;       No Known Allergies Social History   Socioeconomic History   Marital status: Widowed    Spouse name: Not on file   Number of children: 3   Years of education: 6   Highest education level: Not on file  Occupational History   Occupation: Retired  Tobacco Use   Smoking status: Former    Years: 57.00    Types: Cigarettes    Quit date: 05/25/2007    Years since quitting: 14.6   Smokeless tobacco: Never   Tobacco comments:    Does not qualify due to age.   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   Social Determinants of Health   Financial Resource Strain: Low Risk  (03/26/2021)   Overall Financial Resource Strain (CARDIA)    Difficulty of Paying Living Expenses: Not hard at all  Food Insecurity: No Food Insecurity (03/26/2021)   Hunger Vital Sign    Worried About Running Out of Food in the Last Year: Never true    [place] in the Last Year: Never true  Transportation Needs: No Transportation Needs (03/26/2021)   PRAPARE - Hydrologist (Medical): No    Lack of Transportation (Non-Medical): No  Physical Activity: Insufficiently Active (03/26/2021)   Exercise Vital Sign    Days of  Exercise per Week: 3 days    Minutes of Exercise per Session: 30 min  Stress: No Stress Concern Present (03/26/2021)   [place]    Feeling of Stress : Not at all  Social Connections: Moderately Integrated (03/26/2021)   Social Connection and Isolation Panel [NHANES]    Frequency of Communication with Friends and Family: More than three times a week    Frequency of Social Gatherings with Friends and Family: More than three times a week    Attends Religious Services: More than 4 times per year    Active Member of [employer] or Organizations: Yes    Attends Archivist Meetings: More than 4 times per year    Marital Status: Widowed  Intimate Partner Violence: Not At Risk (03/26/2021)   Humiliation, Afraid, Rape, and Kick questionnaire    Fear of Current or Ex-Partner: No    Emotionally Abused: No    Physically Abused: No    Sexually Abused: No    Review of Systems  Respiratory:  Positive for cough.        Objective:   Physical Exam Vitals reviewed.  Constitutional:      General: He is not in acute distress.    Appearance: Normal appearance. He is normal weight. He is not ill-appearing or toxic-appearing.  Eyes:     General: No visual field deficit. Cardiovascular:     Rate and Rhythm: Normal rate. Rhythm irregularly irregular.     Heart sounds: Normal heart sounds.  Pulmonary:     Effort: Pulmonary effort is normal. No respiratory distress.     Breath sounds: Decreased air movement present. No stridor. Decreased breath sounds present. No wheezing, rhonchi or rales.  Chest:     Chest wall: No tenderness.  Musculoskeletal:     Right lower leg: No edema.     Left lower leg: No edema.  Skin:    Findings: No erythema or rash.  Neurological:     Mental Status: He is alert and oriented to person, place, and time.     Cranial Nerves: Cranial nerves 2-12 are intact. No cranial nerve deficit, dysarthria or facial  asymmetry.     Sensory: Sensation is intact.     Motor: No weakness.    EKG today shows an irregularly irregular rhythm with no discernible P waves concerning for atrial fibrillation.      Assessment & Plan:  Irregular heartbeat - Plan: EKG 12-Lead  Atrial fibrillation, unspecified type [facility]) Patient  is in atrial fibrillation today.  He was completely asymptomatic.  I will start him on Xarelto 20 mg a day for stroke prevention.  I will start Toprol-XL 25 mg a day to try to help prevent tachycardia.  His heart rate today is in the 80s consistently.  I will have the patient discontinue valsartan to avoid hypotension.  I will consult his cardiologist.  Also send in some cough medication for bronchitis

## 2022-01-15 NOTE — Telephone Encounter (Signed)
Pt came in office for an appt with pcp, but wanted to ask if you would give him a cb about a prescription he needs inhaler med please.   Cb#: [phone]

## 2022-01-19 DIAGNOSIS — H35371 Puckering of macula, right eye: Secondary | ICD-10-CM | POA: Diagnosis not present

## 2022-01-20 ENCOUNTER — Other Ambulatory Visit: Payer: Self-pay | Admitting: Family Medicine

## 2022-01-20 NOTE — Telephone Encounter (Signed)
Requested Prescriptions  Pending Prescriptions Disp Refills  . gabapentin (NEURONTIN) 300 MG capsule [Pharmacy Med Name: Gabapentin 300 MG Oral Capsule] 180 capsule 0    Sig: TAKE 1 CAPSULE BY MOUTH THREE TIMES DAILY AS NEEDED (FOR SCIATICA)     Neurology: Anticonvulsants - gabapentin Failed - 01/20/2022  1:26 PM      Failed - Cr in normal range and within 360 days    Creat  Date Value Ref Range Status  11/20/2021 1.67 (H) 0.70 - 1.22 mg/dL Final         Passed - Completed PHQ-2 or PHQ-9 in the last 360 days      Passed - Valid encounter within last 12 months    Recent Outpatient Visits          5 months ago Chronic obstructive pulmonary disease, unspecified COPD type ([place])   [place] Pickard, Cammie Mcgee, MD   6 months ago Left sided sciatica   [place] Dennard Schaumann, Cammie Mcgee, MD   8 months ago [place] [name], MD   10 months ago S/P total left hip arthroplasty   [place] [name], MD   1 year ago Chronic obstructive pulmonary disease, unspecified COPD type ([place])   [place] Pickard, Cammie Mcgee, MD      Future Appointments            In 1 week Monge, Helane Gunther, NP [place]. [place]

## 2022-02-01 ENCOUNTER — Ambulatory Visit: Payer: Medicare Other | Attending: Nurse Practitioner | Admitting: Nurse Practitioner

## 2022-02-01 ENCOUNTER — Encounter: Payer: Self-pay | Admitting: Nurse Practitioner

## 2022-02-01 ENCOUNTER — Telehealth: Payer: Self-pay

## 2022-02-01 VITALS — BP 134/80 | HR 70 | Ht 71.0 in | Wt 247.2 lb

## 2022-02-01 DIAGNOSIS — R0609 Other forms of dyspnea: Secondary | ICD-10-CM

## 2022-02-01 DIAGNOSIS — Z8673 Personal history of transient ischemic attack (TIA), and cerebral infarction without residual deficits: Secondary | ICD-10-CM | POA: Diagnosis not present

## 2022-02-01 DIAGNOSIS — Z86718 Personal history of other venous thrombosis and embolism: Secondary | ICD-10-CM | POA: Diagnosis not present

## 2022-02-01 DIAGNOSIS — D6859 Other primary thrombophilia: Secondary | ICD-10-CM

## 2022-02-01 DIAGNOSIS — I10 Essential (primary) hypertension: Secondary | ICD-10-CM | POA: Diagnosis not present

## 2022-02-01 DIAGNOSIS — I480 Paroxysmal atrial fibrillation: Secondary | ICD-10-CM | POA: Diagnosis not present

## 2022-02-01 MED ORDER — RIVAROXABAN 20 MG PO TABS: 20.0000 mg | ORAL_TABLET | Freq: Every day | ORAL | 0 refills | Status: DC

## 2022-02-01 NOTE — Progress Notes (Signed)
Office Visit    Patient Name: Brendan Hopkins Date of Encounter: 02/01/2022  Primary Care Provider:  [name], MD Primary Cardiologist:  Kirk Ruths, MD  Chief Complaint    86 year old male with a history of paroxysmal atrial fibrillation, chronic dyspnea, hypertension, TIA, DVT, COPD, former tobacco use, kidney stones, and GERD who presents for follow-up related to new onset atrial fibrillation.  Past Medical History    Past Medical History:  Diagnosis Date   Acute deep vein thrombosis (DVT) of tibial vein of right lower extremity (HCC)    after ankle fracture/surgery (2/21)   Arthritis    RIGHT HIP, HANDS, BACK   Cancer (HCC)    Skin cancer    Cataract    Cervical radiculopathy    severe C3-4 neuroforaminal stenosis, C5-6 left synovial cyst compressing left nerve root.   Colon polyps    2005   COPD (chronic obstructive pulmonary disease) ([place])    SMOKED FOR 46 YRS   Diverticulosis    2005   Fatty liver    GERD (gastroesophageal reflux disease)    Hard of hearing    History of kidney stones    Hypertension    Leg fracture, right    Pain    RIGHT HIP AND BACK   Personal history of colonic polyps-adenoma and sessile serrated polyp 07/15/2010   Pneumonia 03/2019   RBBB    POSS HX AF IN PAST- PAC'S   Shortness of breath    WITH EXERTION ONLY   Stroke ([place])    TIA   TIA (transient ischemic attack)    Wears glasses    Past Surgical History:  Procedure Laterality Date   COLONOSCOPY     maybe 3    COLONOSCOPY W/ POLYPECTOMY     CYSTOSCOPY WITH RETROGRADE PYELOGRAM, URETEROSCOPY AND STENT PLACEMENT Left 12/11/2019   Procedure: CYSTOSCOPY WITH LEFT RETROGRADE LEFT  URETEROSCOPY WITH HOLMIUM LASER AND STENT PLACEMENT;  Surgeon: Irine Seal, MD;  [facility];  Service: Urology;  Laterality: Left;   ORIF ANKLE FRACTURE Right 07/11/2019   ORIF ANKLE FRACTURE Right 07/11/2019   Procedure: OPEN REDUCTION INTERNAL FIXATION (ORIF) ANKLE FRACTURE;   Surgeon: Shona Needles, MD;  Location: [facility];  Service: Orthopedics;  Laterality: Right;   skin cancers removed      TOTAL HIP ARTHROPLASTY Right 01/09/2013   Procedure: RIGHT TOTAL HIP ARTHROPLASTY ANTERIOR APPROACH;  Surgeon: [name], MD;  [facility];  Service: Orthopedics;  Laterality: Right;   TOTAL HIP ARTHROPLASTY Left 02/24/2021   Procedure: TOTAL HIP ARTHROPLASTY ANTERIOR APPROACH;  Surgeon: Paralee Cancel, MD;  [facility];  Service: Orthopedics;  Laterality: Left;    Allergies  No Known Allergies  History of Present Illness    86 year old male with the above past medical history including paroxysmal atrial fibrillation,chronic dyspnea, hypertension, TIA, DVT, COPD, former tobacco use, kidney stones, and GERD.  He was previously evaluated by Dr. Stanford Breed.  Echocardiogram in 11/2020 showed normal LV function, G1 DD, trace AI, mildly dilated aortic root. Lower extremity venous Dopplers in 02/2021 showed no evidence of DVT (patient does have history of prior DVT in the postop setting).  He was last seen in the office on 07/21/2021 and reported chronic dyspnea on exertion.  This began following a COVID-19 infection 2 years prior. Additionally, he noted chronic mild bilateral lower extremity edema.  Myoview in 07/2021 was low risk, no evidence of ischemia, EF 55 to 60%.  He saw his  PCP on 01/15/2022 and was noted to be in atrial fibrillation.  He was asymptomatic at the time.  He was started on Xarelto and metoprolol and advised to follow-up with cardiology as an outpatient.  He presents today for follow-up.  Since his last visit has been stable from a cardiac standpoint.  He has stable chronic dyspnea, intermittent left-sided chest discomfort, unchanged since the time of his stress test in March 2023.  He has stable mild nonpitting lateral lower extremity edema.  He denies palpitations, dyspnea, PND, orthopnea, weight gain.  He is completely unaware of any atrial fibrillation.  He  denies bleeding on Xarelto.  He is not enthusiastic about having to take a blood thinner but states he will do what is necessary "to live longer."  Otherwise, he reports feeling well and denies any additional concerns today.  Home Medications    Current Outpatient Medications  Medication Sig Dispense Refill   Fluticasone-Umeclidin-Vilant (TRELEGY ELLIPTA) 100-62.5-25 MCG/ACT AEPB Inhale 1 puff into the lungs daily. 1 each 11   furosemide (LASIX) 40 MG tablet Take 1 tablet by mouth once daily 30 tablet 11   gabapentin (NEURONTIN) 300 MG capsule TAKE 1 CAPSULE BY MOUTH THREE TIMES DAILY AS NEEDED (FOR SCIATICA) 180 capsule 0   metoprolol succinate (TOPROL-XL) 25 MG 24 hr tablet Take 1 tablet (25 mg total) by mouth daily. 90 tablet 3   Multiple Vitamin (MULTIVITAMIN WITH MINERALS) TABS Take 1 tablet by mouth daily.     pantoprazole (PROTONIX) 40 MG tablet Take 1 tablet by mouth every day 30 tablet 11   polyethylene glycol powder (GLYCOLAX/MIRALAX) 17 GM/SCOOP powder      potassium chloride SA (KLOR-CON M) 20 MEQ tablet Take 1 tablet (20 mEq total) by mouth daily. 30 tablet 0   prednisoLONE acetate (PRED FORTE) 1 % ophthalmic suspension Place 1 drop into the left eye 4 (four) times daily.     rivaroxaban (XARELTO) 20 MG TABS tablet Take 1 tablet (20 mg total) by mouth daily with supper. 30 tablet 5   rivaroxaban (XARELTO) 20 MG TABS tablet Take 1 tablet (20 mg total) by mouth daily with supper. 21 tablet 0   triamcinolone cream (KENALOG) 0.1 % Apply 1 application topically 2 (two) times daily as needed (irritation).     valsartan (DIOVAN) 80 MG tablet Take 1 tablet by mouth once daily 90 tablet 0   zolpidem (AMBIEN) 10 MG tablet Take 1 tablet by mouth at bedtime as needed for sleep 30 tablet 5   albuterol (VENTOLIN HFA) 108 (90 Base) MCG/ACT inhaler Inhale 1 to 2 puffs by mouth every 6 hours as needed for shortness of breath (Patient not taking: Reported on 11/20/2021) 6.7 each 11   amLODipine  (NORVASC) 5 MG tablet Take 1 tablet by mouth at bedtime 30 tablet 11   docusate sodium (COLACE) 100 MG capsule Take 1 capsule (100 mg total) by mouth 2 (two) times daily. (Patient not taking: Reported on 02/01/2022) 10 capsule 0   HYDROcodone bit-homatropine (HYCODAN) 5-1.5 MG/5ML syrup Take 5 mLs by mouth every 8 (eight) hours as needed for cough. (Patient not taking: Reported on 02/01/2022) 120 mL 0   HYDROcodone-acetaminophen (NORCO/VICODIN) 5-325 MG tablet Take 1 tablet by mouth every 4 (four) hours as needed. (Patient not taking: Reported on 02/01/2022)     meclizine (ANTIVERT) 25 MG tablet Take 1 tablet by mouth 3 times a day as needed (Patient not taking: Reported on 02/01/2022) 90 tablet 11   methocarbamol (ROBAXIN) 500 MG  tablet Take 1 tablet (500 mg total) by mouth every 6 (six) hours as needed for muscle spasms. (Patient not taking: Reported on 11/20/2021) 40 tablet 0   moxifloxacin (VIGAMOX) 0.5 % ophthalmic solution Apply 1 drop to eye 4 (four) times daily. (Patient not taking: Reported on 02/01/2022)     ofloxacin (OCUFLOX) 0.3 % ophthalmic solution Place 1 drop into the left eye 4 (four) times daily. (Patient not taking: Reported on 02/01/2022)     umeclidinium-vilanterol (ANORO ELLIPTA) 62.5-25 MCG/INH AEPB Inhale 1 puff into the lungs daily. (Patient not taking: Reported on 11/20/2021) 1 each 11   No current facility-administered medications for this visit.     Review of Systems    He denies chest pain, palpitations, dyspnea, pnd, orthopnea, n, v, dizziness, syncope, edema, weight gain, or early satiety. All other systems reviewed and are otherwise negative except as noted above.   Physical Exam    VS:  BP 134/80   Pulse 70   Ht '5\' 11"'$  (1.803 m)   Wt 247 lb 3.2 oz (112.1 kg)   SpO2 94%   BMI 34.48 kg/m  GEN: Well nourished, well developed, in no acute distress. HEENT: normal. Neck: Supple, no JVD, carotid bruits, or masses. Cardiac: IRIR, no murmurs, rubs, or gallops. No  clubbing, cyanosis, edema.  Radials/DP/PT 2+ and equal bilaterally.  Respiratory:  Respirations regular and unlabored, clear to auscultation bilaterally. GI: Soft, nontender, nondistended, BS + x 4. MS: no deformity or atrophy. Skin: warm and dry, no rash. Neuro:  Strength thank you sensation are intact. Psych: Normal affect.  Accessory Clinical Findings    ECG personally reviewed by me today -sinus rhythm, 70 bpm, RBBB, possible PACs versus sinus arrhythmia versus wandering pacemaker- no acute changes.   Lab Results  Component Value Date   WBC 9.1 11/20/2021   HGB 15.1 11/20/2021   HCT 44.8 11/20/2021   MCV 92.8 11/20/2021   PLT 212 11/20/2021   Lab Results  Component Value Date   CREATININE 1.67 (H) 11/20/2021   BUN 30 (H) 11/20/2021   NA 140 11/20/2021   K 4.2 11/20/2021   CL 106 11/20/2021   CO2 26 11/20/2021   Lab Results  Component Value Date   ALT 16 08/13/2021   AST 21 08/13/2021   ALKPHOS 69 02/12/2021   BILITOT 0.8 08/13/2021   Lab Results  Component Value Date   CHOL 175 01/26/2019   HDL 46 01/26/2019   LDLCALC 107 (H) 01/26/2019   TRIG 121 01/26/2019   CHOLHDL 3.8 01/26/2019    Lab Results  Component Value Date   HGBA1C 6.0 (H) 01/26/2019    Assessment & Plan    1. Paroxysmal atrial fibrillation: Newly diagnosed at PCP office.  Asymptomatic.  EKG today demonstrate sinus rhythm, 70 bpm, RBBB, possible PACs versus sinus arrhythmia versus wandering pacemaker.  Ideally, would obtain 30-day cardiac monitor to assess A-fib burden.  Patient declines monitor at this time. CHA2DS2VASc = 5.  He is not enthusiastic about taking Xarelto, particularly, given he is in the donut hole currently.  Discussed benefits versus risks of chronic DOAC therapy. However, he agrees to continue Xarelto at this time.  Samples provided in office today. Will check labs (BMET, TSH, Mg). Continue metoprolol, Xarelto.   3. Chronic dyspnea: Lexiscan in 07/2021 was normal, EF 55-60%.  He  denies symptoms concerning for angina, stable chronic dyspnea.  He does have chronic mild nonpitting bilateral lower extremity edema, otherwise euvolemic and well compensated on exam.  Continue Lasix.  4. Hypertension: BP well controlled. Continue current antihypertensive regimen.   5. H/o TIA: Patient denies history.  Continue Xarelto.  6. H/o DVT: Occurred in the postop setting.  Continue Xarelto as above.  7. Disposition: Follow-up in 3-4 months with Dr. Stanford Breed.      Lenna Sciara, NP 02/01/2022, 2:30 PM

## 2022-02-01 NOTE — Patient Instructions (Signed)
Medication Instructions:  Your physician recommends that you continue on your current medications as directed. Please refer to the Current Medication list given to you today.   *If you need a refill on your cardiac medications before your next appointment, please call your pharmacy*   Lab Work: Your physician recommends that you complete labs today. BMET Magnesium TSH  If you have labs (blood work) drawn today and your tests are completely normal, you will receive your results only by: [place] (if you have MyChart) OR A paper copy in the mail If you have any lab test that is abnormal or we need to change your treatment, we will call you to review the results.   Testing/Procedures: NONE ordered at this time of appointment     Follow-Up: At [facility], you and your health needs are our priority.  As part of our continuing mission to provide you with exceptional heart care, we have created designated Provider Care Teams.  These Care Teams include your primary Cardiologist (physician) and Advanced Practice Providers (APPs -  Physician Assistants and Nurse Practitioners) who all work together to provide you with the care you need, when you need it.  We recommend signing up for the patient portal called "MyChart".  Sign up information is provided on this After Visit Summary.  MyChart is used to connect with patients for Virtual Visits (Telemedicine).  Patients are able to view lab/test results, encounter notes, upcoming appointments, etc.  Non-urgent messages can be sent to your provider as well.   To learn more about what you can do with MyChart, go to [email or web address].    Your next appointment:   3-4 month(s)  The format for your next appointment:   In Person  Provider:   Kirk Ruths, MD     Other Instructions   Important Information About Sugar

## 2022-02-01 NOTE — Telephone Encounter (Signed)
Pt called in to ask if office has any samples of rivaroxaban (XARELTO) 20 MG TABS. Pt states that he is the donut whole, and cannot afford this med at this time.Please advise.  Cb#: [phone]

## 2022-02-02 LAB — BASIC METABOLIC PANEL
BUN/Creatinine Ratio: 15 (ref 10–24)
BUN: 18 mg/dL (ref 8–27)
CO2: 24 mmol/L (ref 20–29)
Calcium: 9.6 mg/dL (ref 8.6–10.2)
Chloride: 101 mmol/L (ref 96–106)
Creatinine, Ser: 1.18 mg/dL (ref 0.76–1.27)
Glucose: 99 mg/dL (ref 70–99)
Potassium: 5.1 mmol/L (ref 3.5–5.2)
Sodium: 140 mmol/L (ref 134–144)
eGFR: 61 mL/min/{1.73_m2} (ref >59)

## 2022-02-02 LAB — TSH: TSH: 3.79 u[IU]/mL (ref 0.450–4.500)

## 2022-02-02 LAB — MAGNESIUM: Magnesium: 2.4 mg/dL — ABNORMAL HIGH (ref 1.6–2.3)

## 2022-02-03 ENCOUNTER — Telehealth: Payer: Self-pay

## 2022-02-03 DIAGNOSIS — H349 Unspecified retinal vascular occlusion: Secondary | ICD-10-CM | POA: Diagnosis not present

## 2022-02-03 DIAGNOSIS — H34831 Tributary (branch) retinal vein occlusion, right eye, with macular edema: Secondary | ICD-10-CM | POA: Diagnosis not present

## 2022-02-03 DIAGNOSIS — H35371 Puckering of macula, right eye: Secondary | ICD-10-CM | POA: Diagnosis not present

## 2022-02-03 NOTE — Telephone Encounter (Signed)
Lmom to discuss lab results. Waiting on a return call.  

## 2022-02-11 ENCOUNTER — Telehealth: Payer: Self-pay | Admitting: Family Medicine

## 2022-02-11 ENCOUNTER — Ambulatory Visit: Payer: Medicare Other

## 2022-02-11 NOTE — Telephone Encounter (Signed)
Patient came to office with portable b/p cuff to have someone look at it to make sure it's functioning properly.  Patient also stated his b/p this morning was 155/100. Nurse will take b/p and advise patient.

## 2022-02-15 DIAGNOSIS — H35033 Hypertensive retinopathy, bilateral: Secondary | ICD-10-CM | POA: Diagnosis not present

## 2022-02-15 DIAGNOSIS — D3132 Benign neoplasm of left choroid: Secondary | ICD-10-CM | POA: Diagnosis not present

## 2022-02-15 DIAGNOSIS — H35353 Cystoid macular degeneration, bilateral: Secondary | ICD-10-CM | POA: Diagnosis not present

## 2022-02-15 DIAGNOSIS — H3023 Posterior cyclitis, bilateral: Secondary | ICD-10-CM | POA: Diagnosis not present

## 2022-02-18 ENCOUNTER — Telehealth: Payer: Self-pay | Admitting: Family Medicine

## 2022-02-18 ENCOUNTER — Ambulatory Visit (INDEPENDENT_AMBULATORY_CARE_PROVIDER_SITE_OTHER): Payer: Medicare Other | Admitting: Family Medicine

## 2022-02-18 VITALS — BP 154/80 | HR 78 | Temp 97.1°F | Wt 246.8 lb

## 2022-02-18 DIAGNOSIS — I4891 Unspecified atrial fibrillation: Secondary | ICD-10-CM

## 2022-02-18 DIAGNOSIS — K649 Unspecified hemorrhoids: Secondary | ICD-10-CM | POA: Diagnosis not present

## 2022-02-18 MED ORDER — HYDROCORTISONE 2.5 % EX CREA: TOPICAL_CREAM | Freq: Two times a day (BID) | CUTANEOUS | 0 refills | Status: DC

## 2022-02-18 NOTE — Progress Notes (Signed)
+++++++++++++++++++++++++++++++++++++++++++++++++++++++++++++++++++++++++++++++++++++++++++                                                                                    + + +  ++         +++      Subjective:    Patient ID: Brendan Hopkins, male    DOB: [birth year], 86 y.o.   MRN: [id number]  01/15/22 Patient came in because he had a cough for 2 weeks.  He denies any fevers or chills or chest pain.  He denies any shortness of breath or pleurisy.  However on examination today he has an irregularly irregular heart rate more than his baseline.  He has a history of PACs but this seems much exaggerated.  Patient states he is unable to feel them.  At that time, my plan was: Patient is in atrial fibrillation today.  He was completely asymptomatic.  I will start him on Xarelto 20 mg a day for stroke prevention.  I will start Toprol-XL 25 mg a day to try to help prevent tachycardia.  His heart rate today is in the 80s consistently.  I will have the patient discontinue valsartan to avoid hypotension.  I will consult his cardiologist.  Also send in some cough medication for bronchitis  02/18/22 Patient has been taking Xarelto and metoprolol.  His heart rate is well controlled in the 70s.  However his blood pressure is now too high with a systolic blood pressure frequently 1 50-1 60.  He is holding valsartan as I instructed him to do at his last visit.  He also complains of pain around his rectum.  He denies any bleeding.  Rectal exam today shows a small anal fissure at 9:00 and an external hemorrhoid at 3:00.  There is no thrombosis or obvious bleeding.  Internal rectal exam is normal. Past Medical History:  Diagnosis Date   Acute deep vein thrombosis (DVT) of tibial vein of right lower extremity (HCC)    after ankle fracture/surgery (2/21)   Arthritis    RIGHT HIP, HANDS, BACK   Cancer (HCC)    Skin  cancer    Cataract    Cervical radiculopathy    severe C3-4 neuroforaminal stenosis, C5-6 left synovial cyst compressing left nerve root.   Colon polyps    2005   COPD (chronic obstructive pulmonary disease) ([place])    SMOKED FOR 4 YRS   Diverticulosis    2005   Fatty liver    GERD (gastroesophageal reflux disease)    Hard of hearing    History of kidney stones    Hypertension    Leg fracture, right    Pain    RIGHT HIP AND BACK   Personal history of colonic polyps-adenoma and sessile serrated polyp 07/15/2010   Pneumonia 03/2019   RBBB    POSS HX AF IN PAST- PAC'S   Shortness of breath    WITH EXERTION ONLY   Stroke ([place])    TIA   TIA (transient ischemic attack)    Wears glasses    Past Surgical History:  Procedure Laterality Date   COLONOSCOPY     maybe 3  COLONOSCOPY W/ POLYPECTOMY     CYSTOSCOPY WITH RETROGRADE PYELOGRAM, URETEROSCOPY AND STENT PLACEMENT Left 12/11/2019   Procedure: CYSTOSCOPY WITH LEFT RETROGRADE LEFT  URETEROSCOPY WITH HOLMIUM LASER AND STENT PLACEMENT;  Surgeon: Irine Seal, MD;  Location: [facility];  Service: Urology;  Laterality: Left;   ORIF ANKLE FRACTURE Right 07/11/2019   ORIF ANKLE FRACTURE Right 07/11/2019   Procedure: OPEN REDUCTION INTERNAL FIXATION (ORIF) ANKLE FRACTURE;  Surgeon: Shona Needles, MD;  Location: [facility];  Service: Orthopedics;  Laterality: Right;   skin cancers removed      TOTAL HIP ARTHROPLASTY Right 01/09/2013   Procedure: RIGHT TOTAL HIP ARTHROPLASTY ANTERIOR APPROACH;  Surgeon: [name], MD;  Location: [facility];  Service: Orthopedics;  Laterality: Right;   TOTAL HIP ARTHROPLASTY Left 02/24/2021   Procedure: TOTAL HIP ARTHROPLASTY ANTERIOR APPROACH;  Surgeon: Paralee Cancel, MD;  Location: [facility];  Service: Orthopedics;  Laterality: Left;       No Known Allergies Social History   Socioeconomic History   Marital status: Widowed    Spouse name: Not on file   Number of children: 3   Years of education: 28    Highest education level: Not on file  Occupational History   Occupation: Retired  Tobacco Use   Smoking status: Former    Years: 57.00    Types: Cigarettes    Quit date: 05/25/2007    Years since quitting: 14.7   Smokeless tobacco: Never   Tobacco comments:    Does not qualify due to age.   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   Social Determinants of Health   Financial Resource Strain: Low Risk  (03/26/2021)   Overall Financial Resource Strain (CARDIA)    Difficulty of Paying Living Expenses: Not hard at all  Food Insecurity: No Food Insecurity (03/26/2021)   Hunger Vital Sign    Worried About Running Out of Food in the Last Year: Never true    Ran Out of Food in the Last Year: Never true  Transportation Needs: No Transportation Needs (03/26/2021)   PRAPARE - Hydrologist (Medical): No    Lack of Transportation (Non-Medical): No  Physical Activity: Insufficiently Active (03/26/2021)   Exercise Vital Sign    Days of Exercise per Week: 3 days    Minutes of Exercise per Session: 30 min  Stress: No Stress Concern Present (03/26/2021)   [place]    Feeling of Stress : Not at all  Social Connections: Moderately Integrated (03/26/2021)   Social Connection and Isolation Panel [NHANES]    Frequency of Communication with Friends and Family: More than three times a week    Frequency of Social Gatherings with Friends and Family: More than three times a week    Attends Religious Services: More than 4 times per year    Active Member of [employer] or Organizations: Yes    Attends Archivist Meetings: More than 4 times per  year    Marital Status: Widowed  Intimate Partner Violence: Not At Risk (03/26/2021)   Humiliation, Afraid, Rape, and Kick questionnaire  Fear of Current or Ex-Partner: No    Emotionally Abused: No    Physically Abused: No    Sexually Abused: No    Review of Systems  Respiratory:  Positive for cough.        Objective:   Physical Exam Vitals reviewed.  Constitutional:      General: He is not in acute distress.    Appearance: Normal appearance. He is normal weight. He is not ill-appearing or toxic-appearing.  Eyes:     General: No visual field deficit. Cardiovascular:     Rate and Rhythm: Normal rate. Rhythm irregularly irregular.     Heart sounds: Normal heart sounds.  Pulmonary:     Effort: Pulmonary effort is normal. No respiratory distress.     Breath sounds: Decreased air movement present. No stridor. Decreased breath sounds present. No wheezing, rhonchi or rales.  Chest:     Chest wall: No tenderness.  Genitourinary:    Rectum: Anal fissure and external hemorrhoid present. No tenderness.  Musculoskeletal:     Right lower leg: No edema.     Left lower leg: No edema.  Skin:    Findings: No erythema or rash.  Neurological:     Mental Status: He is alert and oriented to person, place, and time.     Cranial Nerves: Cranial nerves 2-12 are intact. No cranial nerve deficit, dysarthria or facial asymmetry.     Sensory: Sensation is intact.     Motor: No weakness.          Assessment & Plan:  Atrial fibrillation, unspecified type (HCC)  Hemorrhoids, unspecified hemorrhoid type Patient's atrial fibrillation is now adequately rate controlled on Toprol-XL 25 mg daily.  Xarelto is effectively preventing stroke.  Patient is asymptomatic.  However his blood pressure is elevated so I want him to resume valsartan 80 mg a day.  I will give him hydrocortisone 2.5% cream to apply to the hemorrhoid twice daily for 10 days.

## 2022-02-18 NOTE — Telephone Encounter (Signed)
Left message to return call to reschedule AWV to a different time, or different date depending on patient's preference due to change in schedule template.

## 2022-03-03 ENCOUNTER — Other Ambulatory Visit: Payer: Self-pay | Admitting: Family Medicine

## 2022-03-03 DIAGNOSIS — I10 Essential (primary) hypertension: Secondary | ICD-10-CM

## 2022-03-03 NOTE — Telephone Encounter (Signed)
Requested Prescriptions  Pending Prescriptions Disp Refills  . valsartan (DIOVAN) 80 MG tablet [Pharmacy Med Name: Valsartan 80 MG Oral Tablet] 90 tablet 0    Sig: Take 1 tablet by mouth once daily     Cardiovascular:  Angiotensin Receptor Blockers Failed - 03/03/2022  3:30 PM      Failed - Last BP in normal range    BP Readings from Last 1 Encounters:  02/18/22 (!) 154/80         Failed - Valid encounter within last 6 months    Recent Outpatient Visits          6 months ago Chronic obstructive pulmonary disease, unspecified COPD type ([place])   [place] Pickard, Cammie Mcgee, MD   7 months ago Left sided sciatica   [place] [name], MD   9 months ago [place] [name], MD   12 months ago S/P total left hip arthroplasty   [place] [name], MD   1 year ago Chronic obstructive pulmonary disease, unspecified COPD type ([place])   [facility] Pickard, Cammie Mcgee, MD      Future Appointments            In 3 months Crenshaw, Denice Bors, MD [facility] [place]           Passed - Cr in normal range and within 180 days    Creat  Date Value Ref Range Status  11/20/2021 1.67 (H) 0.70 - 1.22 mg/dL Final   Creatinine, Ser  Date Value Ref Range Status  02/01/2022 1.18 0.76 - 1.27 mg/dL Final         Passed - K in normal range and within 180 days    Potassium  Date Value Ref Range Status  02/01/2022 5.1 3.5 - 5.2 mmol/L Final         Passed - Patient is not pregnant

## 2022-03-10 DIAGNOSIS — M1711 Unilateral primary osteoarthritis, right knee: Secondary | ICD-10-CM | POA: Diagnosis not present

## 2022-03-10 DIAGNOSIS — M25562 Pain in left knee: Secondary | ICD-10-CM | POA: Diagnosis not present

## 2022-03-10 DIAGNOSIS — M1712 Unilateral primary osteoarthritis, left knee: Secondary | ICD-10-CM | POA: Diagnosis not present

## 2022-03-10 DIAGNOSIS — M25561 Pain in right knee: Secondary | ICD-10-CM | POA: Diagnosis not present

## 2022-03-10 DIAGNOSIS — Z96642 Presence of left artificial hip joint: Secondary | ICD-10-CM | POA: Diagnosis not present

## 2022-03-12 ENCOUNTER — Other Ambulatory Visit: Payer: Self-pay | Admitting: Family Medicine

## 2022-03-12 NOTE — Telephone Encounter (Signed)
Last OV 02/18/22. Within 12 months. Will refill medication.  Requested Prescriptions  Pending Prescriptions Disp Refills  . amLODipine (NORVASC) 5 MG tablet [Pharmacy Med Name: Amlodipine Besylate '5mg'$  Tablet] 90 tablet 0    Sig: Take 1 tablet by mouth at bedtime     Cardiovascular: Calcium Channel Blockers 2 Failed - 03/12/2022  2:03 AM      Failed - Last BP in normal range    BP Readings from Last 1 Encounters:  02/18/22 (!) 154/80         Failed - Valid encounter within last 6 months    Recent Outpatient Visits          7 months ago Chronic obstructive pulmonary disease, unspecified COPD type ([place])   [place] Pickard, Cammie Mcgee, MD   7 months ago Left sided sciatica   [place] [facility] Schaumann, Cammie Mcgee, MD   10 months ago [place] [facility] Schaumann, Cammie Mcgee, MD   1 year ago S/P total left hip arthroplasty   [place] [name], MD   1 year ago Chronic obstructive pulmonary disease, unspecified COPD type ([place])   [facility] Pickard, Cammie Mcgee, MD      Future Appointments            In 2 months Crenshaw, Denice Bors, MD [place] [facility]           Passed - Last Heart Rate in normal range    Pulse Readings from Last 1 Encounters:  02/18/22 78

## 2022-03-25 ENCOUNTER — Telehealth: Payer: Self-pay

## 2022-03-25 NOTE — Telephone Encounter (Signed)
  Prescription Request  03/25/2022  Is this a "Controlled Substance" medicine? Yes  LOV: 03/12/2022   What is the name of the medication or equipment? gabapentin (NEURONTIN) 300 MG   Have you contacted your pharmacy to request a refill? No   Which pharmacy would you like this sent to?  [facility] ([place]), [place] - [address]  Patient notified that their request is being sent to the clinical staff for review and that they should receive a response within 2 business days.   Please advise at [phone]

## 2022-03-26 ENCOUNTER — Ambulatory Visit (INDEPENDENT_AMBULATORY_CARE_PROVIDER_SITE_OTHER): Payer: Medicare Other

## 2022-03-26 ENCOUNTER — Telehealth: Payer: Self-pay

## 2022-03-26 DIAGNOSIS — Z0000 Encounter for general adult medical examination without abnormal findings: Secondary | ICD-10-CM

## 2022-03-26 DIAGNOSIS — K649 Unspecified hemorrhoids: Secondary | ICD-10-CM

## 2022-03-26 NOTE — Telephone Encounter (Signed)
Pt stated that he stop using the cream for his hemorrhoids due it was irritating him. Pt then started using Vaseline instead.   Pt wanted to know if you can treat his hemorrhoids other than medication?   Pls advice

## 2022-03-26 NOTE — Progress Notes (Signed)
Subjective:   Brendan Hopkins is a 86 y.o. male who presents for Medicare Annual/Subsequent preventive examination.  Review of Systems    Respiratory Cardiac Risk Factors include: advanced age (>77mn, >>89women);obesity (BMI >30kg/m2);Other (see comment)Musculosktletal and Integument Cardiovascular other    Objective:    There were no vitals filed for this visit. BMI 34.44     03/26/2022    9:12 AM 03/26/2021   12:09 PM 02/24/2021    5:00 PM 02/12/2021   11:19 AM 12/06/2019   11:53 AM 11/18/2019    9:48 PM 07/11/2019    5:48 PM  Advanced Directives  Does Patient Have a Medical Advance Directive? No No Yes Yes No No No  Type of AScientist, physiologicalof [place]egend LakeLiving will HHicoLiving will     Does patient want to make changes to medical advance directive?   No - Patient declined      Copy of HCaledoniain Chart?   No - copy requested      Would patient like information on creating a medical advance directive?  No - Patient declined    No - Patient declined No - Patient declined    Current Medications (verified) Outpatient Encounter Medications as of 03/26/2022  Medication Sig   albuterol (VENTOLIN HFA) 108 (90 Base) MCG/ACT inhaler Inhale 1 to 2 puffs by mouth every 6 hours as needed for shortness of breath   amLODipine (NORVASC) 5 MG tablet Take 1 tablet by mouth at bedtime   docusate sodium (COLACE) 100 MG capsule Take 1 capsule (100 mg total) by mouth 2 (two) times daily.   Fluticasone-Umeclidin-Vilant (TRELEGY ELLIPTA) 100-62.5-25 MCG/ACT AEPB Inhale 1 puff into the lungs daily.   furosemide (LASIX) 40 MG tablet Take 1 tablet by mouth once daily   gabapentin (NEURONTIN) 300 MG capsule TAKE 1 CAPSULE BY MOUTH THREE TIMES DAILY AS NEEDED (FOR SCIATICA)   HYDROcodone bit-homatropine (HYCODAN) 5-1.5 MG/5ML syrup Take 5 mLs by mouth every 8 (eight) hours as needed for cough.   HYDROcodone-acetaminophen (NORCO/VICODIN)  5-325 MG tablet Take 1 tablet by mouth every 4 (four) hours as needed.   hydrocortisone 2.5 % cream Apply topically 2 (two) times daily.   meclizine (ANTIVERT) 25 MG tablet Take 1 tablet by mouth 3 times a day as needed   methocarbamol (ROBAXIN) 500 MG tablet Take 1 tablet (500 mg total) by mouth every 6 (six) hours as needed for muscle spasms.   metoprolol succinate (TOPROL-XL) 25 MG 24 hr tablet Take 1 tablet (25 mg total) by mouth daily.   moxifloxacin (VIGAMOX) 0.5 % ophthalmic solution Apply 1 drop to eye 4 (four) times daily.   Multiple Vitamin (MULTIVITAMIN WITH MINERALS) TABS Take 1 tablet by mouth daily.   ofloxacin (OCUFLOX) 0.3 % ophthalmic solution Place 1 drop into the left eye 4 (four) times daily.   pantoprazole (PROTONIX) 40 MG tablet Take 1 tablet by mouth every day   polyethylene glycol powder (GLYCOLAX/MIRALAX) 17 GM/SCOOP powder    potassium chloride SA (KLOR-CON M) 20 MEQ tablet Take 1 tablet (20 mEq total) by mouth daily.   prednisoLONE acetate (PRED FORTE) 1 % ophthalmic suspension Place 1 drop into the left eye 4 (four) times daily.   rivaroxaban (XARELTO) 20 MG TABS tablet Take 1 tablet (20 mg total) by mouth daily with supper.   rivaroxaban (XARELTO) 20 MG TABS tablet Take 1 tablet (20 mg total) by mouth daily with supper.   triamcinolone  cream (KENALOG) 0.1 % Apply 1 application topically 2 (two) times daily as needed (irritation).   umeclidinium-vilanterol (ANORO ELLIPTA) 62.5-25 MCG/INH AEPB Inhale 1 puff into the lungs daily.   valsartan (DIOVAN) 80 MG tablet Take 1 tablet by mouth once daily   zolpidem (AMBIEN) 10 MG tablet Take 1 tablet by mouth at bedtime as needed for sleep   No facility-administered encounter medications on file as of 03/26/2022.    Allergies (verified) Patient has no known allergies.   History: Past Medical History:  Diagnosis Date   Acute deep vein thrombosis (DVT) of tibial vein of right lower extremity (HCC)    after ankle  fracture/surgery (2/21)   Arthritis    RIGHT HIP, HANDS, BACK   Cancer (HCC)    Skin cancer    Cataract    Cervical radiculopathy    severe C3-4 neuroforaminal stenosis, C5-6 left synovial cyst compressing left nerve root.   Colon polyps    2005   COPD (chronic obstructive pulmonary disease) ([place])    SMOKED FOR 54 YRS   Diverticulosis    2005   Fatty liver    GERD (gastroesophageal reflux disease)    Hard of hearing    History of kidney stones    Hypertension    Leg fracture, right    Pain    RIGHT HIP AND BACK   Personal history of colonic polyps-adenoma and sessile serrated polyp 07/15/2010   Pneumonia 03/2019   RBBB    POSS HX AF IN PAST- PAC'S   Shortness of breath    WITH EXERTION ONLY   Stroke ([place])    TIA   TIA (transient ischemic attack)    Wears glasses    Past Surgical History:  Procedure Laterality Date   COLONOSCOPY     maybe 3    COLONOSCOPY W/ POLYPECTOMY     CYSTOSCOPY WITH RETROGRADE PYELOGRAM, URETEROSCOPY AND STENT PLACEMENT Left 12/11/2019   Procedure: CYSTOSCOPY WITH LEFT RETROGRADE LEFT  URETEROSCOPY WITH HOLMIUM LASER AND STENT PLACEMENT;  Surgeon: Irine Seal, MD;  [facility];  Service: Urology;  Laterality: Left;   ORIF ANKLE FRACTURE Right 07/11/2019   ORIF ANKLE FRACTURE Right 07/11/2019   Procedure: OPEN REDUCTION INTERNAL FIXATION (ORIF) ANKLE FRACTURE;  Surgeon: Shona Needles, MD;  Location: [place];  Service: Orthopedics;  Laterality: Right;   skin cancers removed      TOTAL HIP ARTHROPLASTY Right 01/09/2013   Procedure: RIGHT TOTAL HIP ARTHROPLASTY ANTERIOR APPROACH;  Surgeon: [name], MD;  [facility];  Service: Orthopedics;  Laterality: Right;   TOTAL HIP ARTHROPLASTY Left 02/24/2021   Procedure: TOTAL HIP ARTHROPLASTY ANTERIOR APPROACH;  Surgeon: Paralee Cancel, MD;  [facility];  Service: Orthopedics;  Laterality: Left;   Family History  Problem Relation Age of Onset   Cancer Mother        died more of old age at  age 51   Cancer Father    Colon cancer Neg Hx    Esophageal cancer Neg Hx    Prostate cancer Neg Hx    Rectal cancer Neg Hx    Stomach cancer Neg Hx    Social History   Socioeconomic History   Marital status: Widowed    Spouse name: Not on file   Number of children: 3   Years of education: 38   Highest education level: Not on file  Occupational History   Occupation: Retired  Tobacco Use   Smoking status: Former  Years: 57.00    Types: Cigarettes    Quit date: 05/25/2007    Years since quitting: 14.8   Smokeless tobacco: Never   Tobacco comments:    Does not qualify due to age.   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   Social Determinants of Health   Financial Resource Strain: Medium Risk (03/26/2022)   Overall Financial Resource Strain (CARDIA)    Difficulty of Paying Living Expenses: Somewhat hard  Food Insecurity: No Food Insecurity (03/26/2022)   Hunger Vital Sign    Worried About Running Out of Food in the Last Year: Never true    Ran Out of Food in the Last Year: Never true  Transportation Needs: No Transportation Needs (03/26/2022)   PRAPARE - Hydrologist (Medical): No    Lack of Transportation (Non-Medical): No  Physical Activity: Inactive (03/26/2022)   Exercise Vital Sign    Days of Exercise per Week: 0 days    Minutes of Exercise per Session: 0 min  Stress: No Stress Concern Present (03/26/2021)   [place]    Feeling of Stress : Not at all  Social Connections: Socially Isolated (03/26/2022)   Social Connection and Isolation Panel [NHANES]    Frequency of Communication with Friends and Family:  Never    Frequency of Social Gatherings with Friends and Family: Never    Attends Religious Services: 1 to 4 times per year    Active Member of [employer] or Organizations: No    Attends Archivist Meetings: Never    Marital Status: Widowed    Tobacco Counseling Counseling given: Not Answered Tobacco comments: Does not qualify due to age.    Clinical Intake:  Pre-visit preparation completed: Yes  Pain : No/denies pain     BMI - recorded: 34.44 Nutritional Status: BMI > 30  Obese Diabetes: No  How often do you need to have someone help you when you read instructions, pamphlets, or other written materials from your doctor or pharmacy?: 1 - Never What is the last grade level you completed in school?: 12  Diabetic?NO       Activities of Daily Living    03/26/2022    9:14 AM  In your present state of health, do you have any difficulty performing the following activities:  Hearing? 0  Vision? 0  Difficulty concentrating or making decisions? 0  Walking or climbing stairs? 0  Dressing or bathing? 0  Doing errands, shopping? 0  Preparing Food and eating ? N  Using the Toilet? N  In the past six months, have you accidently leaked urine? N  Do you have problems with loss of bowel control? N  Managing your Medications? N  Managing your Finances? N  Housekeeping or managing your Housekeeping? N    Patient Care Team: [name], MD as PCP - General (Family Medicine) Stanford Breed Denice Bors, MD as PCP - Cardiology (Cardiology) Edythe Clarity, [facility] as Pharmacist (Pharmacist)  Indicate any recent Medical Services you may have received from other than [facility] providers in the past year (date may be approximate).  Assessment:   This is a routine wellness examination for [place].  Hearing/Vision screen No results found.  Dietary issues and exercise activities discussed: Exercise limited by: cardiac condition(s);orthopedic condition(s);respiratory  conditions(s)   Goals Addressed   None   Depression Screen    03/26/2021   12:04 PM 03/11/2020   10:21 AM 02/26/2020    3:13 PM 01/25/2019    3:31 PM 01/25/2019    2:54 PM 12/20/2017   10:58 AM 12/20/2017    9:31 AM  PHQ 2/9 Scores  PHQ - 2 Score 0 0 0 0 0 0 0  PHQ- 9 Score     0      Fall Risk    03/26/2022    8:58 AM 03/26/2021   12:10 PM 03/11/2020   10:21 AM 02/26/2020    3:13 PM 01/25/2019    3:31 PM  [place] in the past year? 0 0 0 1 0  Number falls in past yr:  0 0 0 0  Injury with Fall?  0 0 1 0  Risk for fall due to :  Impaired balance/gait;Impaired mobility     Follow up  Falls prevention discussed Falls evaluation completed      FALL RISK PREVENTION PERTAINING TO THE HOME:  Any stairs in or around the home? Yes  If so, are there any without handrails? Yes  Home free of loose throw rugs in walkways, pet beds, electrical cords, etc? Yes  Adequate lighting in your home to reduce risk of falls? Yes   ASSISTIVE DEVICES UTILIZED TO PREVENT FALLS:  Life alert? Yes  Use of a cane, walker or w/c? No  Grab bars in the bathroom? No  Shower chair or bench in shower? Yes  Elevated toilet seat or a handicapped toilet? yes  TIMED UP AND GO:  Was the test performed?  n/a .  Length of time to ambulate 10 feet: n/a sec.     Cognitive Function:        03/26/2022    9:17 AM 03/26/2021   12:13 PM  6CIT Screen  What Year? 0 points 0 points  What month? 0 points 0 points  What time? 3 points 0 points  Count back from 20 0 points 0 points  Months in reverse 0 points 0 points  Repeat phrase 0 points 0 points  Total Score 3 points 0 points    Immunizations Immunization History  Administered Date(s) Administered   Fluad Quad(high Dose 65+) 01/25/2019, 02/21/2020, 02/10/2021   Influenza Split 02/15/2011   Influenza, High Dose Seasonal PF 02/17/2018   Influenza,inj,Quad PF,6+ Mos 04/09/2013, 02/12/2014, 03/14/2015, 03/04/2016, 03/02/2017   PFIZER(Purple  Top)SARS-COV-2 Vaccination 08/16/2019, 09/06/2019, 03/17/2020, 11/07/2020   Pfizer Covid-19 Vaccine Bivalent Booster 39yr & up 03/24/2021   Pneumococcal Conjugate-13 04/09/2013   Pneumococcal Polysaccharide-23 12/19/2014   Td 09/10/2003   Zoster Recombinat (Shingrix) 11/07/2020    TDAP status: Up to date  Flu Vaccine status: Up to date  Pneumococcal vaccine status: Up to date  Covid-19 vaccine status: Completed vaccines  Qualifies for Shingles Vaccine? No   Zostavax completed No   Shingrix Completed?: No.    Education has been provided regarding the importance of this vaccine. Patient has been advised to call insurance company to determine out of pocket expense if they have not yet received this vaccine. Advised may also receive vaccine at local pharmacy or Health Dept. Verbalized acceptance and understanding.  Screening Tests Health Maintenance  Topic Date Due   TETANUS/TDAP  09/09/2013   COLONOSCOPY (Pts 45-70yr Insurance coverage will need to be confirmed)  12/28/2018   Zoster Vaccines- Shingrix (2 of 2) 01/02/2021   COVID-19 Vaccine (6 - Pfizer series) 07/22/2021   INFLUENZA VACCINE  12/22/2021   Medicare Annual Wellness (AWV)  03/27/2023   Pneumonia Vaccine 86 Years old  Completed   HPV VACCINES  Aged Out    Health Maintenance  Health Maintenance Due  Topic Date Due   TETANUS/TDAP  09/09/2013   COLONOSCOPY (Pts 45-441yrInsurance coverage will need to be confirmed)  12/28/2018   Zoster Vaccines- Shingrix (2 of 2) 01/02/2021   COVID-19 Vaccine (6 - Pfizer series) 07/22/2021   INFLUENZA VACCINE  12/22/2021    Colorectal cancer screening: No longer required.   Lung Cancer Screening: (Low Dose CT Chest recommended if Age 322-80ears, 30 pack-year currently smoking OR have quit w/in 15years.) does not qualify.   Lung Cancer Screening Referral: n/a  Additional Screening:  Hepatitis C Screening: does not qualify; Completed n/a  Vision Screening: Recommended  annual ophthalmology exams for early detection of glaucoma and other disorders of the eye. Is the patient up to date with their annual eye exam?  Yes  Who is the provider or what is the name of the office in which the patient attends annual eye exams? Dr. GoNoralee Spacef pt is not established with a provider, would they like to be referred to a provider to establish care? No .   Dental Screening: Recommended annual dental exams for proper oral hygiene  Community Resource Referral / Chronic Care Management: CRR required this visit?  No   CCM required this visit?  No      Plan:     I have personally reviewed and noted the following in the patient's chart:   Medical and social history Use of alcohol, tobacco or illicit drugs  Current medications and supplements including opioid prescriptions. Pt is currently taking medications which includes opioid in it. Functional ability and status Nutritional status Physical activity Advanced directives List of other physicians Hospitalizations, surgeries, and ER visits in previous 12 months Vitals Screenings to include cognitive, depression, and falls Referrals and appointments  In addition, I have reviewed and discussed with patient certain preventive protocols, quality metrics, and best practice recommendations. A written personalized care plan for preventive services as well as general preventive health recommendations were provided to patient.     BlColman CaterCMStanding Rock 03/26/2022   Nurse Notes: n/a

## 2022-03-26 NOTE — Telephone Encounter (Signed)
Pt would like a recommendation for GI banding hemorrhoids per pt if you feel that it's neccessary to be done.

## 2022-03-27 ENCOUNTER — Other Ambulatory Visit: Payer: Self-pay | Admitting: Family Medicine

## 2022-03-29 NOTE — Addendum Note (Signed)
Addended by: Colman Cater on: 03/29/2022 09:32 AM   Modules accepted: Orders

## 2022-03-29 NOTE — Telephone Encounter (Signed)
Requested Prescriptions  Pending Prescriptions Disp Refills   gabapentin (NEURONTIN) 300 MG capsule [Pharmacy Med Name: Gabapentin 300 MG Oral Capsule] 270 capsule 0    Sig: TAKE 1 CAPSULE BY MOUTH THREE TIMES DAILY AS NEEDED FOR  SCIATICA     Neurology: Anticonvulsants - gabapentin Failed - 03/27/2022 10:10 AM      Failed - Completed PHQ-2 or PHQ-9 in the last 360 days      Passed - Cr in normal range and within 360 days    Creat  Date Value Ref Range Status  11/20/2021 1.67 (H) 0.70 - 1.22 mg/dL Final   Creatinine, Ser  Date Value Ref Range Status  02/01/2022 1.18 0.76 - 1.27 mg/dL Final         Passed - Valid encounter within last 12 months    Recent Outpatient Visits           7 months ago Chronic obstructive pulmonary disease, unspecified COPD type ([place])   [place] Pickard, Cammie Mcgee, MD   8 months ago Left sided sciatica   [place] Dennard Schaumann, Cammie Mcgee, MD   10 months ago [place] Dennard Schaumann, Cammie Mcgee, MD   1 year ago S/P total left hip arthroplasty   [place] [name], MD   1 year ago Chronic obstructive pulmonary disease, unspecified COPD type ([place])   [facility] Pickard, Cammie Mcgee, MD       Future Appointments             In 2 months Crenshaw, Denice Bors, MD [place] [facility] [place] [place]

## 2022-03-29 NOTE — Telephone Encounter (Signed)
Referral sent to GI regarding banding hemorrhoids per Dr. Dennard Schaumann

## 2022-04-19 ENCOUNTER — Other Ambulatory Visit: Payer: Self-pay

## 2022-05-04 ENCOUNTER — Other Ambulatory Visit: Payer: Self-pay

## 2022-05-04 ENCOUNTER — Other Ambulatory Visit: Payer: Self-pay | Admitting: Family Medicine

## 2022-05-04 ENCOUNTER — Ambulatory Visit (INDEPENDENT_AMBULATORY_CARE_PROVIDER_SITE_OTHER): Payer: Medicare Other | Admitting: Family Medicine

## 2022-05-04 VITALS — BP 132/74 | HR 60 | Ht 71.0 in | Wt 249.4 lb

## 2022-05-04 DIAGNOSIS — R051 Acute cough: Secondary | ICD-10-CM | POA: Diagnosis not present

## 2022-05-04 DIAGNOSIS — J449 Chronic obstructive pulmonary disease, unspecified: Secondary | ICD-10-CM

## 2022-05-04 MED ORDER — PREDNISONE 20 MG PO TABS: ORAL_TABLET | ORAL | 0 refills | Status: DC

## 2022-05-04 MED ORDER — HYDROCODONE BIT-HOMATROP MBR 5-1.5 MG/5ML PO SOLN: 5.0000 mL | Freq: Three times a day (TID) | ORAL | 0 refills | Status: DC | PRN

## 2022-05-04 NOTE — Progress Notes (Signed)
Subjective:    Patient ID: Brendan Hopkins, male    DOB: 11-Feb-1936, 86 y.o.   MRN: [id number] Patient developed a cough last week on Thursday.  Cough got worse over the weekend and today he reports shortness of breath.  He states that yesterday he felt like his lungs were tight and he could not get air.  He denies any chest pain.  He denies any pleurisy.  The cough is nonproductive.  On examination he has diminished breath sounds bilaterally.  He has known COPD and he is currently on Trelegy.  There is no significant wheezing but the breaths are diminished.  He has bibasilar crackles that chronic finding.  Chest ray and thousand 200 base atelectasis there is no significant edema in his lower extremities and his weight is relatively stable. Past Medical History:  Diagnosis Date   Acute deep vein thrombosis (DVT) of tibial vein of right lower extremity (HCC)    after ankle fracture/surgery (2/21)   Arthritis    RIGHT HIP, HANDS, BACK   Cancer (HCC)    Skin cancer    Cataract    Cervical radiculopathy    severe C3-4 neuroforaminal stenosis, C5-6 left synovial cyst compressing left nerve root.   Colon polyps    2005   COPD (chronic obstructive pulmonary disease) ([place])    SMOKED FOR 97 YRS   Diverticulosis    2005   Fatty liver    GERD (gastroesophageal reflux disease)    Hard of hearing    History of kidney stones    Hypertension    Leg fracture, right    Pain    RIGHT HIP AND BACK   Personal history of colonic polyps-adenoma and sessile serrated polyp 07/15/2010   Pneumonia 03/2019   RBBB    POSS HX AF IN PAST- PAC'S   Shortness of breath    WITH EXERTION ONLY   Stroke ([place])    TIA   TIA (transient ischemic attack)    Wears glasses    Past Surgical History:  Procedure Laterality Date   COLONOSCOPY     maybe 3    COLONOSCOPY W/ POLYPECTOMY     CYSTOSCOPY WITH RETROGRADE PYELOGRAM, URETEROSCOPY AND STENT PLACEMENT Left 12/11/2019   Procedure: CYSTOSCOPY WITH LEFT  RETROGRADE LEFT  URETEROSCOPY WITH HOLMIUM LASER AND STENT PLACEMENT;  Surgeon: Irine Seal, MD;  [facility];  Service: Urology;  Laterality: Left;   ORIF ANKLE FRACTURE Right 07/11/2019   ORIF ANKLE FRACTURE Right 07/11/2019   Procedure: OPEN REDUCTION INTERNAL FIXATION (ORIF) ANKLE FRACTURE;  Surgeon: Shona Needles, MD;  Location: [place];  Service: Orthopedics;  Laterality: Right;   skin cancers removed      TOTAL HIP ARTHROPLASTY Right 01/09/2013   Procedure: RIGHT TOTAL HIP ARTHROPLASTY ANTERIOR APPROACH;  Surgeon: [name], MD;  [facility];  Service: Orthopedics;  Laterality: Right;   TOTAL HIP ARTHROPLASTY Left 02/24/2021   Procedure: TOTAL HIP ARTHROPLASTY ANTERIOR APPROACH;  Surgeon: Paralee Cancel, MD;  [facility];  Service: Orthopedics;  Laterality: Left;       No Known Allergies Social History   Socioeconomic History   Marital status: Widowed    Spouse name: Not on file   Number of children: 3   Years of education: 65   Highest education level: Not on file  Occupational History   Occupation: Retired  Tobacco Use   Smoking status: Former    Years: 57.00    Types: Cigarettes  Quit date: 05/25/2007    Years since quitting: 14.9   Smokeless tobacco: Never   Tobacco comments:    Does not qualify due to age.   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   Social Determinants of Health   Financial Resource Strain: Medium Risk (03/26/2022)   Overall Financial Resource Strain (CARDIA)    Difficulty of Paying Living Expenses: Somewhat hard  Food Insecurity: No Food Insecurity (03/26/2022)   Hunger Vital Sign    Worried About Running Out of Food in the Last Year: Never true    Ran Out  of Food in the Last Year: Never true  Transportation Needs: No Transportation Needs (03/26/2022)   PRAPARE - Hydrologist (Medical): No    Lack of Transportation (Non-Medical): No  Physical Activity: Inactive (03/26/2022)   Exercise Vital Sign    Days of Exercise per Week: 0 days    Minutes of Exercise per Session: 0 min  Stress: No Stress Concern Present (03/26/2021)   [place]    Feeling of Stress : Not at all  Social Connections: Socially Isolated (03/26/2022)   Social Connection and Isolation Panel [NHANES]    Frequency of Communication with Friends and Family: Never    Frequency of Social Gatherings with Friends and Family: Never    Attends Religious Services: 1 to 4 times per year    Active Member of [employer] or Organizations: No    Attends Archivist Meetings: Never    Marital Status: Widowed  Intimate Partner Violence: Not At Risk (03/26/2021)   Humiliation, Afraid, Rape, and Kick questionnaire    Fear of Current or Ex-Partner: No    Emotionally Abused: No    Physically Abused: No    Sexually Abused: No    Review of Systems  Respiratory:  Positive for cough.        Objective:   Physical Exam Vitals reviewed.  Constitutional:      General: He is not in acute distress.    Appearance: Normal appearance. He is normal weight. He is not ill-appearing or toxic-appearing.  Eyes:     General: No visual field deficit. Cardiovascular:     Rate and Rhythm: Normal rate. Rhythm irregularly irregular.     Heart sounds: Normal heart sounds.  Pulmonary:     Effort: Pulmonary effort is normal. No respiratory distress.     Breath sounds: Decreased air movement present. No stridor. Decreased breath sounds and rales present. No wheezing or rhonchi.  Chest:     Chest wall: No tenderness.  Genitourinary:    Rectum: Anal fissure and external hemorrhoid present. No tenderness.   Musculoskeletal:     Right lower leg: No edema.     Left lower leg: No edema.  Skin:    Findings: No erythema or rash.  Neurological:     Mental Status: He is alert and oriented to person, place, and time.     Cranial Nerves: Cranial nerves 2-12 are intact. No cranial nerve deficit, dysarthria or facial asymmetry.     Sensory: Sensation is intact.     Motor:  No weakness.          Assessment & Plan:  Acute cough - Plan: DG Chest 2 View I believe the patient's cough and shortness of breath is likely due to his underlying COPD exacerbated by recent virus.  I will start the patient on prednisone and treat the cough with Hycodan.  I will get a chest x-ray given the bibasilar crackles to rule out any pulmonary edema.  I feel the bibasilar crackles are likely due to his atelectasis and or chronic finding.  I do not feel that the patient has fluid overload.  However if his SOB worsens or he develops worse swelling he is to contact me immediately

## 2022-05-07 ENCOUNTER — Other Ambulatory Visit: Payer: Self-pay | Admitting: Family Medicine

## 2022-05-10 ENCOUNTER — Ambulatory Visit
Admission: RE | Admit: 2022-05-10 | Discharge: 2022-05-10 | Disposition: A | Discharge status: Home / Self Care | Payer: Medicare Other | Source: Ambulatory Visit | Attending: Family Medicine | Admitting: Family Medicine

## 2022-05-10 ENCOUNTER — Other Ambulatory Visit: Payer: Self-pay | Admitting: Family Medicine

## 2022-05-10 DIAGNOSIS — R051 Acute cough: Secondary | ICD-10-CM

## 2022-05-10 DIAGNOSIS — L570 Actinic keratosis: Secondary | ICD-10-CM | POA: Diagnosis not present

## 2022-05-10 DIAGNOSIS — L578 Other skin changes due to chronic exposure to nonionizing radiation: Secondary | ICD-10-CM | POA: Diagnosis not present

## 2022-05-10 DIAGNOSIS — L738 Other specified follicular disorders: Secondary | ICD-10-CM | POA: Diagnosis not present

## 2022-05-10 DIAGNOSIS — Z08 Encounter for follow-up examination after completed treatment for malignant neoplasm: Secondary | ICD-10-CM | POA: Diagnosis not present

## 2022-05-10 DIAGNOSIS — R059 Cough, unspecified: Secondary | ICD-10-CM | POA: Diagnosis not present

## 2022-05-10 DIAGNOSIS — Z85828 Personal history of other malignant neoplasm of skin: Secondary | ICD-10-CM | POA: Diagnosis not present

## 2022-05-10 DIAGNOSIS — C679 Malignant neoplasm of bladder, unspecified: Secondary | ICD-10-CM | POA: Diagnosis not present

## 2022-05-10 MED ORDER — LEVOFLOXACIN 500 MG PO TABS: 500.0000 mg | ORAL_TABLET | Freq: Every day | ORAL | 0 refills | Status: AC

## 2022-05-12 ENCOUNTER — Telehealth: Payer: Self-pay

## 2022-05-12 NOTE — Telephone Encounter (Signed)
Pt called in concerned and wanting to discuss his results of his xray. Please advise  Cb#: [phone]

## 2022-05-26 NOTE — Progress Notes (Deleted)
HPI: FU dyspnea and atrial fibrillation. Note pt also with h/o COPD. Abd ultrasound 6/14 showed no AAA. Echo 7/22 showed normal LV function, grade 1 DD, trace AI and mildly dilated aortic root. Lower ext venous dopplers 10/22 showed no DVT on left.  Nuclear study March 2023 showed ejection fraction 60% with normal perfusion.  Patient felt to be in atrial fibrillation during a visit with his primary care physician in August 2023.  He was asked to follow-up with cardiology and at time of evaluation he converted spontaneously to sinus rhythm.  He has chronic dyspnea since contracting COVID.  Since last seen  Current Outpatient Medications  Medication Sig Dispense Refill   albuterol (VENTOLIN HFA) 108 (90 Base) MCG/ACT inhaler Inhale 1 to 2  puffs by mouth every 6 hours as needed for shortness of breath 6.7 each 11   amLODipine (NORVASC) 5 MG tablet Take 1 tablet by mouth at bedtime 90 tablet 0   AREXVY 120 MCG/0.5ML injection      docusate sodium (COLACE) 100 MG capsule Take 1 capsule (100 mg total) by mouth 2 (two) times daily. 10 capsule 0   Fluticasone-Umeclidin-Vilant (TRELEGY ELLIPTA) 100-62.5-25 MCG/ACT AEPB Inhale 1 puff into the lungs daily. 1 each 11   FLUZONE HIGH-DOSE QUADRIVALENT 0.7 ML SUSY      furosemide (LASIX) 40 MG tablet Take 1 tablet by mouth once daily 30 tablet 11   gabapentin (NEURONTIN) 300 MG capsule TAKE 1 CAPSULE BY MOUTH THREE TIMES DAILY AS NEEDED FOR  SCIATICA 270 capsule 0   HYDROcodone bit-homatropine (HYCODAN) 5-1.5 MG/5ML syrup Take 5 mLs by mouth every 8 (eight) hours as needed for cough. 120 mL 0   HYDROcodone-acetaminophen (NORCO/VICODIN) 5-325 MG tablet Take 1 tablet by mouth every 4 (four) hours as needed.     hydrocortisone 2.5 % cream Apply topically 2 (two) times daily. 30 g 0   meclizine (ANTIVERT) 25 MG tablet Take 1 tablet by mouth 3 times a day as needed 90 tablet 11   methocarbamol (ROBAXIN) 500 MG tablet Take 1 tablet (500 mg total) by mouth every  6 (six) hours as needed for muscle spasms. 40 tablet 0   metoprolol succinate (TOPROL-XL) 25 MG 24 hr tablet Take 1 tablet (25 mg total) by mouth daily. 90 tablet 3   moxifloxacin (VIGAMOX) 0.5 % ophthalmic solution Apply 1 drop to eye 4 (four) times daily.     Multiple Vitamin (MULTIVITAMIN WITH MINERALS) TABS Take 1 tablet by mouth daily.     ofloxacin (OCUFLOX) 0.3 % ophthalmic solution Place 1 drop into the left eye 4 (four) times daily.     pantoprazole (PROTONIX) 40 MG tablet Take 1 tablet by mouth every day 30 tablet 11   polyethylene glycol powder (GLYCOLAX/MIRALAX) 17 GM/SCOOP powder      potassium chloride SA (KLOR-CON M) 20 MEQ tablet Take 1 tablet (20 mEq total) by mouth daily. 30 tablet 0   prednisoLONE acetate (PRED FORTE) 1 % ophthalmic suspension Place 1 drop into the left eye 4 (four) times daily.     predniSONE (DELTASONE) 20 MG tablet 3 tabs poqday 1-2, 2 tabs poqday 3-4, 1 tab poqday 5-6 12 tablet 0   rivaroxaban (XARELTO) 20 MG TABS tablet Take 1 tablet (20 mg total) by mouth daily with supper. 30 tablet 5   rivaroxaban (XARELTO) 20 MG TABS tablet Take 1 tablet (20 mg total) by mouth daily with supper. 21 tablet 0   triamcinolone cream (KENALOG) 0.1 % Apply  1 application topically 2 (two) times daily as needed (irritation).     umeclidinium-vilanterol (ANORO ELLIPTA) 62.5-25 MCG/INH AEPB Inhale 1 puff into the lungs daily. 1 each 11   valsartan (DIOVAN) 80 MG tablet Take 1 tablet by mouth once daily 90 tablet 0   zolpidem (AMBIEN) 10 MG tablet Take 1 tablet by mouth at bedtime as needed for sleep 30 tablet 5   No current facility-administered medications for this visit.     Past Medical History:  Diagnosis Date   Acute deep vein thrombosis (DVT) of tibial vein of right lower extremity (HCC)    after ankle fracture/surgery (2/21)   Arthritis    RIGHT HIP, HANDS, BACK   Cancer (HCC)    Skin cancer    Cataract    Cervical radiculopathy    severe C3-4 neuroforaminal  stenosis, C5-6 left synovial cyst compressing left nerve root.   Colon polyps    2005   COPD (chronic obstructive pulmonary disease) ([place])    SMOKED FOR 37 YRS   Diverticulosis    2005   Fatty liver    GERD (gastroesophageal reflux disease)    Hard of hearing    History of kidney stones    Hypertension    Leg fracture, right    Pain    RIGHT HIP AND BACK   Personal history of colonic polyps-adenoma and sessile serrated polyp 07/15/2010   Pneumonia 03/2019   RBBB    POSS HX AF IN PAST- PAC'S   Shortness of breath    WITH EXERTION ONLY   Stroke ([place])    TIA   TIA (transient ischemic attack)    Wears glasses     Past Surgical History:  Procedure Laterality Date   COLONOSCOPY     maybe 3    COLONOSCOPY W/ POLYPECTOMY     CYSTOSCOPY WITH RETROGRADE PYELOGRAM, URETEROSCOPY AND STENT PLACEMENT Left 12/11/2019   Procedure: CYSTOSCOPY WITH LEFT RETROGRADE LEFT  URETEROSCOPY WITH HOLMIUM LASER AND STENT PLACEMENT;  Surgeon: Irine Seal, MD;  [facility];  Service: Urology;  Laterality: Left;   ORIF ANKLE FRACTURE Right 07/11/2019   ORIF ANKLE FRACTURE Right 07/11/2019   Procedure: OPEN REDUCTION INTERNAL FIXATION (ORIF) ANKLE FRACTURE;  Surgeon: Shona Needles, MD;  Location: [place];  Service: Orthopedics;  Laterality: Right;   skin cancers removed      TOTAL HIP ARTHROPLASTY Right 01/09/2013   Procedure: RIGHT TOTAL HIP ARTHROPLASTY ANTERIOR APPROACH;  Surgeon: [name], MD;  [facility];  Service: Orthopedics;  Laterality: Right;   TOTAL HIP ARTHROPLASTY Left 02/24/2021   Procedure: TOTAL HIP ARTHROPLASTY ANTERIOR APPROACH;  Surgeon: Paralee Cancel, MD;  [facility];  Service: Orthopedics;  Laterality: Left;    Social History   Socioeconomic History   Marital status: Widowed    Spouse name: Not on file   Number of children: 3   Years of education: 58   Highest education level: Not on file  Occupational History   Occupation: Retired  Tobacco Use   Smoking  status: Former    Years: 57.00    Types: Cigarettes    Quit date: 05/25/2007    Years since quitting: 15.0   Smokeless tobacco: Never   Tobacco comments:    Does not qualify due to age.   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   Social Determinants of Health   Financial Resource Strain: Medium Risk (03/26/2022)   Overall Financial Resource Strain (CARDIA)    Difficulty of Paying Living Expenses: Somewhat hard  Food Insecurity: No Food Insecurity (03/26/2022)   Hunger Vital Sign    Worried About Running Out of Food in the Last Year: Never true    Ran Out of Food in the Last Year: Never true  Transportation Needs: No Transportation Needs (03/26/2022)   PRAPARE - Hydrologist (Medical): No    Lack of Transportation (Non-Medical): No  Physical Activity: Inactive (03/26/2022)   Exercise Vital Sign    Days of Exercise per Week: 0 days    Minutes of Exercise per Session: 0 min  Stress: No Stress Concern Present (03/26/2021)   [place]    Feeling of Stress : Not at all  Social Connections: Socially Isolated (03/26/2022)   Social Connection and Isolation Panel [NHANES]    Frequency of Communication with Friends and Family: Never    Frequency of Social Gatherings with Friends and Family: Never    Attends Religious Services: 1 to 4 times per year    Active Member of [employer] or Organizations: No    Attends Archivist Meetings: Never    Marital Status: Widowed  Intimate Partner Violence: Not At Risk (03/26/2021)   Humiliation, Afraid, Rape, and Kick questionnaire    Fear of Current or Ex-Partner: No    Emotionally Abused:  No    Physically Abused: No    Sexually Abused: No    Family History  Problem Relation Age of Onset   Cancer Mother        died more of old age at age 23   Cancer Father    Colon cancer Neg Hx    Esophageal cancer Neg Hx    Prostate cancer Neg Hx    Rectal cancer Neg Hx    Stomach cancer Neg Hx     ROS: no fevers or chills, productive cough, hemoptysis, dysphasia, odynophagia, melena, hematochezia, dysuria, hematuria, rash, seizure activity, orthopnea, PND, pedal edema, claudication. Remaining systems are negative.  Physical Exam: Well-developed well-nourished in no acute distress.  Skin is warm and dry.  HEENT is normal.  Neck is supple.  Chest is clear to auscultation with normal expansion.  Cardiovascular exam is regular rate and rhythm.  Abdominal exam nontender or distended. No masses palpated. Extremities show no edema. neuro grossly intact  ECG- personally reviewed  A/P  1 question paroxysmal atrial fibrillation-patient previously felt to potentially be in atrial fibrillation.  I have reviewed the electrocardiogram from January 15, 2022 and this appears to show sinus rhythm with PACs.  Will arrange outpatient monitor to further assess.  If no atrial fibrillation demonstrated may could discontinue anticoagulation.  He had previously never been diagnosed with atrial fibrillation.  2 dyspnea-this is felt likely secondary to previous COVID.  His echocardiogram showed normal LV function and his nuclear study showed no ischemia.  3 hypertension-blood pressure controlled.  Continue present medical regimen.  Kirk Ruths, MD

## 2022-05-27 DIAGNOSIS — H35033 Hypertensive retinopathy, bilateral: Secondary | ICD-10-CM | POA: Diagnosis not present

## 2022-05-27 DIAGNOSIS — H35353 Cystoid macular degeneration, bilateral: Secondary | ICD-10-CM | POA: Diagnosis not present

## 2022-05-27 DIAGNOSIS — H3023 Posterior cyclitis, bilateral: Secondary | ICD-10-CM | POA: Diagnosis not present

## 2022-05-27 DIAGNOSIS — D3132 Benign neoplasm of left choroid: Secondary | ICD-10-CM | POA: Diagnosis not present

## 2022-06-02 ENCOUNTER — Other Ambulatory Visit: Payer: Self-pay | Admitting: Family Medicine

## 2022-06-02 DIAGNOSIS — K219 Gastro-esophageal reflux disease without esophagitis: Secondary | ICD-10-CM

## 2022-06-02 NOTE — Telephone Encounter (Signed)
Requested Prescriptions  Pending Prescriptions Disp Refills   pantoprazole (PROTONIX) 40 MG tablet [Pharmacy Med Name: Pantoprazole Sodium '40mg'$  Delayed-Release Tablet] 30 tablet 11    Sig: Take 1 tablet by mouth every day     Gastroenterology: Proton Pump Inhibitors Passed - 06/02/2022  1:01 AM      Passed - Valid encounter within last 12 months    Recent Outpatient Visits           9 months ago Chronic obstructive pulmonary disease, unspecified COPD type ([place])   [place] Pickard, Cammie Mcgee, MD   10 months ago Left sided sciatica   [place] Pickard, Cammie Mcgee, MD   1 year ago [place] [name], MD   1 year ago S/P total left hip arthroplasty   [place] [name], MD   1 year ago Chronic obstructive pulmonary disease, unspecified COPD type ([place])   [facility] Pickard, Cammie Mcgee, MD       Future Appointments             In 1 week Crenshaw, Denice Bors, MD [place] [facility] [place]. [place]

## 2022-06-03 ENCOUNTER — Other Ambulatory Visit: Payer: Self-pay | Admitting: Family Medicine

## 2022-06-03 NOTE — Telephone Encounter (Signed)
Requested medication (s) are due for refill today: yes  Requested medication (s) are on the active medication list: yes  Last refill:  12/31/21 #30 5 RF  Future visit scheduled: no  Notes to clinic:  med not delegated to NT to RF   Requested Prescriptions  Pending Prescriptions Disp Refills   zolpidem (AMBIEN) 10 MG tablet [Pharmacy Med Name: Zolpidem Tartrate '10mg'$  Tablet] 30 tablet 5    Sig: Take 1 tablet by mouth at bedtime as needed for sleep     Not Delegated - Psychiatry:  Anxiolytics/Hypnotics Failed - 06/03/2022  1:07 AM      Failed - This refill cannot be delegated      Failed - Urine Drug Screen completed in last 360 days      Failed - Valid encounter within last 6 months    Recent Outpatient Visits           9 months ago Chronic obstructive pulmonary disease, unspecified COPD type ([place])   [place] Pickard, Cammie Mcgee, MD   10 months ago Left sided sciatica   [place] Schaumann, Cammie Mcgee, MD   1 year ago [place], Warren T, MD   1 year ago S/P total left hip arthroplasty   [place] [name], MD   1 year ago Chronic obstructive pulmonary disease, unspecified COPD type ([place])   [facility] Pickard, Cammie Mcgee, MD       Future Appointments             In 6 days Crenshaw, Denice Bors, MD [place] [facility] [place]. [place]

## 2022-06-09 ENCOUNTER — Other Ambulatory Visit: Payer: Self-pay | Admitting: Family Medicine

## 2022-06-09 ENCOUNTER — Ambulatory Visit: Payer: Medicare Other | Admitting: Cardiology

## 2022-06-09 NOTE — Telephone Encounter (Signed)
Rx 08/07/21 #30 11RF- too soon Requested Prescriptions  Pending Prescriptions Disp Refills   furosemide (LASIX) 40 MG tablet [Pharmacy Med Name: Furosemide '40mg'$  Tablet] 30 tablet 11    Sig: Take 1 tablet by mouth once daily     Cardiovascular:  Diuretics - Loop Failed - 06/09/2022  1:01 AM      Failed - Mg Level in normal range and within 180 days    Magnesium  Date Value Ref Range Status  02/01/2022 2.4 (H) 1.6 - 2.3 mg/dL Final         Failed - Valid encounter within last 6 months    Recent Outpatient Visits           10 months ago Chronic obstructive pulmonary disease, unspecified COPD type ([place])   [place] Pickard, Cammie Mcgee, MD   10 months ago Left sided sciatica   [place] Pickard, Cammie Mcgee, MD   1 year ago [place] [name], MD   1 year ago S/P total left hip arthroplasty   [place] [name], MD   1 year ago Chronic obstructive pulmonary disease, unspecified COPD type ([place])   [place] Pickard, Cammie Mcgee, MD              Passed - K in normal range and within 180 days    Potassium  Date Value Ref Range Status  02/01/2022 5.1 3.5 - 5.2 mmol/L Final         Passed - Ca in normal range and within 180 days    Calcium  Date Value Ref Range Status  02/01/2022 9.6 8.6 - 10.2 mg/dL Final   Calcium, Ion  Date Value Ref Range Status  08/12/2009 1.12 1.12 - 1.32 mmol/L Final         Passed - Na in normal range and within 180 days    Sodium  Date Value Ref Range Status  02/01/2022 140 134 - 144 mmol/L Final         Passed - Cr in normal range and within 180 days    Creat  Date Value Ref Range Status  11/20/2021 1.67 (H) 0.70 - 1.22 mg/dL Final   Creatinine, Ser  Date Value Ref Range Status  02/01/2022 1.18 0.76 - 1.27 mg/dL Final         Passed - Cl in normal range and within 180 days    Chloride  Date Value Ref Range Status   02/01/2022 101 96 - 106 mmol/L Final         Passed - Last BP in normal range    BP Readings from Last 1 Encounters:  05/04/22 132/74

## 2022-06-22 ENCOUNTER — Ambulatory Visit (INDEPENDENT_AMBULATORY_CARE_PROVIDER_SITE_OTHER): Payer: Medicare Other | Admitting: Family Medicine

## 2022-06-22 VITALS — BP 120/70 | HR 75 | Temp 97.7°F | Ht 71.0 in | Wt 254.0 lb

## 2022-06-22 DIAGNOSIS — Z1329 Encounter for screening for other suspected endocrine disorder: Secondary | ICD-10-CM | POA: Diagnosis not present

## 2022-06-22 DIAGNOSIS — K146 Glossodynia: Secondary | ICD-10-CM | POA: Diagnosis not present

## 2022-06-22 DIAGNOSIS — E611 Iron deficiency: Secondary | ICD-10-CM | POA: Diagnosis not present

## 2022-06-22 DIAGNOSIS — R7989 Other specified abnormal findings of blood chemistry: Secondary | ICD-10-CM | POA: Diagnosis not present

## 2022-06-22 MED ORDER — AMITRIPTYLINE HCL 25 MG PO TABS: 25.0000 mg | ORAL_TABLET | Freq: Every day | ORAL | 0 refills | Status: DC

## 2022-06-22 NOTE — Progress Notes (Signed)
Acute Office Visit  Subjective:     Patient ID: Brendan Hopkins, male    DOB: 1935-08-09, 87 y.o.   MRN: [id number]  Chief Complaint  Patient presents with   Acute Visit    Tongue burning    HPI Patient is in today for months of tongue burning. It is worse when he eats, associated with dry mouth, and is relieved by water. He does wear upper and lower dentures and brushes his teeth twice a day. Has tried nothing.  Review of Systems  All other systems reviewed and are negative.   Past Medical History:  Diagnosis Date   Acute deep vein thrombosis (DVT) of tibial vein of right lower extremity (HCC)    after ankle fracture/surgery (2/21)   Arthritis    RIGHT HIP, HANDS, BACK   Cancer (HCC)    Skin cancer    Cataract    Cervical radiculopathy    severe C3-4 neuroforaminal stenosis, C5-6 left synovial cyst compressing left nerve root.   Colon polyps    2005   COPD (chronic obstructive pulmonary disease) ([place])    SMOKED FOR 63 YRS   Diverticulosis    2005   Fatty liver    GERD (gastroesophageal reflux disease)    Hard of hearing    History of kidney stones    Hypertension    Leg fracture, right    Pain    RIGHT HIP AND BACK   Personal history of colonic polyps-adenoma and sessile serrated polyp 07/15/2010   Pneumonia 03/2019   RBBB    POSS HX AF IN PAST- PAC'S   Shortness of breath    WITH EXERTION ONLY   Stroke ([place])    TIA   TIA (transient ischemic attack)    Wears glasses    Past Surgical History:  Procedure Laterality Date   COLONOSCOPY     maybe 3    COLONOSCOPY W/ POLYPECTOMY     CYSTOSCOPY WITH RETROGRADE PYELOGRAM, URETEROSCOPY AND STENT PLACEMENT Left 12/11/2019   Procedure: CYSTOSCOPY WITH LEFT RETROGRADE LEFT  URETEROSCOPY WITH HOLMIUM LASER AND STENT PLACEMENT;  Surgeon: Irine Seal, MD;  [facility];  Service: Urology;  Laterality: Left;   ORIF ANKLE FRACTURE Right 07/11/2019   ORIF ANKLE FRACTURE Right 07/11/2019   Procedure: OPEN  REDUCTION INTERNAL FIXATION (ORIF) ANKLE FRACTURE;  Surgeon: Shona Needles, MD;  [facility];  Service: Orthopedics;  Laterality: Right;   skin cancers removed      TOTAL HIP ARTHROPLASTY Right 01/09/2013   Procedure: RIGHT TOTAL HIP ARTHROPLASTY ANTERIOR APPROACH;  Surgeon: [name], MD;  [facility];  Service: Orthopedics;  Laterality: Right;   TOTAL HIP ARTHROPLASTY Left 02/24/2021   Procedure: TOTAL HIP ARTHROPLASTY ANTERIOR APPROACH;  Surgeon: Paralee Cancel, MD;  [facility];  Service: Orthopedics;  Laterality: Left;   Current Outpatient Medications on File Prior to Visit  Medication Sig Dispense Refill   albuterol (VENTOLIN HFA) 108 (90 Base) MCG/ACT inhaler Inhale 1 to 2  puffs by mouth every 6 hours as needed for shortness of breath 6.7 each 11   amLODipine (NORVASC) 5 MG tablet Take 1 tablet by mouth at bedtime 90 tablet 0   AREXVY 120 MCG/0.5ML injection      Fluticasone-Umeclidin-Vilant (TRELEGY ELLIPTA) 100-62.5-25 MCG/ACT AEPB Inhale 1 puff into the lungs daily. 1 each 11   FLUZONE HIGH-DOSE QUADRIVALENT 0.7 ML SUSY      furosemide (LASIX) 40 MG tablet Take 1 tablet by mouth once  daily 30 tablet 11   gabapentin (NEURONTIN) 300 MG capsule TAKE 1 CAPSULE BY MOUTH THREE TIMES DAILY AS NEEDED FOR  SCIATICA 270 capsule 0   hydrocortisone 2.5 % cream Apply topically 2 (two) times daily. 30 g 0   meclizine (ANTIVERT) 25 MG tablet Take 1 tablet by mouth 3 times a day as needed 90 tablet 11   Multiple Vitamin (MULTIVITAMIN WITH MINERALS) TABS Take 1 tablet by mouth daily.     pantoprazole (PROTONIX) 40 MG tablet Take 1 tablet by mouth every day 30 tablet 11   valsartan (DIOVAN) 80 MG tablet Take 1 tablet by mouth once daily 90 tablet 0   zolpidem (AMBIEN) 10 MG tablet Take 1 tablet by mouth at bedtime as needed for sleep 30 tablet 5   docusate sodium (COLACE) 100 MG capsule Take 1 capsule (100 mg total) by mouth 2 (two) times daily. (Patient not taking: Reported on  06/22/2022) 10 capsule 0   HYDROcodone bit-homatropine (HYCODAN) 5-1.5 MG/5ML syrup Take 5 mLs by mouth every 8 (eight) hours as needed for cough. (Patient not taking: Reported on 06/22/2022) 120 mL 0   HYDROcodone-acetaminophen (NORCO/VICODIN) 5-325 MG tablet Take 1 tablet by mouth every 4 (four) hours as needed. (Patient not taking: Reported on 06/22/2022)     methocarbamol (ROBAXIN) 500 MG tablet Take 1 tablet (500 mg total) by mouth every 6 (six) hours as needed for muscle spasms. (Patient not taking: Reported on 06/22/2022) 40 tablet 0   metoprolol succinate (TOPROL-XL) 25 MG 24 hr tablet Take 1 tablet (25 mg total) by mouth daily. (Patient not taking: Reported on 06/22/2022) 90 tablet 3   moxifloxacin (VIGAMOX) 0.5 % ophthalmic solution Apply 1 drop to eye 4 (four) times daily. (Patient not taking: Reported on 06/22/2022)     ofloxacin (OCUFLOX) 0.3 % ophthalmic solution Place 1 drop into the left eye 4 (four) times daily. (Patient not taking: Reported on 06/22/2022)     polyethylene glycol powder (GLYCOLAX/MIRALAX) 17 GM/SCOOP powder  (Patient not taking: Reported on 06/22/2022)     potassium chloride SA (KLOR-CON M) 20 MEQ tablet Take 1 tablet (20 mEq total) by mouth daily. (Patient not taking: Reported on 06/22/2022) 30 tablet 0   prednisoLONE acetate (PRED FORTE) 1 % ophthalmic suspension Place 1 drop into the left eye 4 (four) times daily. (Patient not taking: Reported on 06/22/2022)     predniSONE (DELTASONE) 20 MG tablet 3 tabs poqday 1-2, 2 tabs poqday 3-4, 1 tab poqday 5-6 (Patient not taking: Reported on 06/22/2022) 12 tablet 0   rivaroxaban (XARELTO) 20 MG TABS tablet Take 1 tablet (20 mg total) by mouth daily with supper. (Patient not taking: Reported on 06/22/2022) 30 tablet 5   rivaroxaban (XARELTO) 20 MG TABS tablet Take 1 tablet (20 mg total) by mouth daily with supper. (Patient not taking: Reported on 06/22/2022) 21 tablet 0   triamcinolone cream (KENALOG) 0.1 % Apply 1 application topically 2  (two) times daily as needed (irritation). (Patient not taking: Reported on 06/22/2022)     umeclidinium-vilanterol (ANORO ELLIPTA) 62.5-25 MCG/INH AEPB Inhale 1 puff into the lungs daily. (Patient not taking: Reported on 06/22/2022) 1 each 11   No current facility-administered medications on file prior to visit.   No Known Allergies      Objective:    BP 120/70   Pulse 75   Temp 97.7 F (36.5 C) (Oral)   Ht '5\' 11"'$  (1.803 m)   Wt 254 lb (115.2 kg)   SpO2 95%  BMI 35.43 kg/m    Physical Exam Vitals and nursing note reviewed.  Constitutional:      Appearance: Normal appearance. He is normal weight.  HENT:     Head: Normocephalic and atraumatic.     Mouth/Throat:     Lips: Pink.     Mouth: Mucous membranes are moist. No oral lesions.     Dentition: Has dentures.     Tongue: No lesions.     Pharynx: Oropharynx is clear.     Tonsils: No tonsillar exudate or tonsillar abscesses.  Skin:    General: Skin is warm and dry.     Capillary Refill: Capillary refill takes less than 2 seconds.  Neurological:     General: No focal deficit present.     Mental Status: He is alert and oriented to person, place, and time. Mental status is at baseline.  Psychiatric:        Mood and Affect: Mood normal.        Behavior: Behavior normal.        Thought Content: Thought content normal.        Judgment: Judgment normal.     No results found for any visits on 06/22/22.      Assessment & Plan:   Problem List Items Addressed This Visit       Digestive   Burning mouth syndrome - Primary    Patients complaint of isolated burning and dry mouth for several months with normal oral mucosa is consistent with burning mouth syndrome. Will obtain labs to evaluate for underlying etiology and start Amitriptyline '25mg'$  nightly. Instructed to avoid triggers if any are identified. May use Biotene for dry mouth. Return to office if symptoms persist or worsen.      Relevant Orders   Iron   Zinc    B12 and Folate Panel   TSH   BASIC METABOLIC PANEL WITH GFR    Meds ordered this encounter  Medications   amitriptyline (ELAVIL) 25 MG tablet    Sig: Take 1 tablet (25 mg total) by mouth at bedtime.    Dispense:  30 tablet    Refill:  0    Order Specific Question:   Supervising Provider    Answer:   Jenna Luo T [[id number]]    Return if symptoms worsen or fail to improve.  Rubie Maid, FNP

## 2022-06-22 NOTE — Assessment & Plan Note (Signed)
Patients complaint of isolated burning and dry mouth for several months with normal oral mucosa is consistent with burning mouth syndrome. Will obtain labs to evaluate for underlying etiology and start Amitriptyline '25mg'$  nightly. Instructed to avoid triggers if any are identified. May use Biotene for dry mouth. Return to office if symptoms persist or worsen.

## 2022-06-23 ENCOUNTER — Other Ambulatory Visit: Payer: Self-pay | Admitting: Family Medicine

## 2022-06-23 DIAGNOSIS — E611 Iron deficiency: Secondary | ICD-10-CM

## 2022-06-24 ENCOUNTER — Other Ambulatory Visit: Payer: Self-pay | Admitting: Family Medicine

## 2022-06-24 MED ORDER — IRON (FERROUS SULFATE) 325 (65 FE) MG PO TABS: 325.0000 mg | ORAL_TABLET | Freq: Every day | ORAL | 0 refills | Status: DC

## 2022-06-24 NOTE — Telephone Encounter (Signed)
Requested Prescriptions  Pending Prescriptions Disp Refills   gabapentin (NEURONTIN) 300 MG capsule [Pharmacy Med Name: Gabapentin 300 MG Oral Capsule] 270 capsule 2    Sig: TAKE 1 CAPSULE BY MOUTH THREE TIMES DAILY AS NEEDED FOR  SCIATICA     Neurology: Anticonvulsants - gabapentin Failed - 06/24/2022  2:30 PM      Failed - Cr in normal range and within 360 days    Creat  Date Value Ref Range Status  06/22/2022 1.36 (H) 0.70 - 1.22 mg/dL Final         Passed - Completed PHQ-2 or PHQ-9 in the last 360 days      Passed - Valid encounter within last 12 months    Recent Outpatient Visits           10 months ago Chronic obstructive pulmonary disease, unspecified COPD type ([place])   [place] Pickard, Cammie Mcgee, MD   11 months ago Left sided sciatica   [place] Pickard, Cammie Mcgee, MD   1 year ago [place] [name], MD   1 year ago S/P total left hip arthroplasty   [place] [name], MD   1 year ago Chronic obstructive pulmonary disease, unspecified COPD type ([place])   [facility] Pickard, Cammie Mcgee, MD

## 2022-06-24 NOTE — Addendum Note (Signed)
Addended by: Rubie Maid on: 06/24/2022 05:19 PM   Modules accepted: Orders

## 2022-07-03 ENCOUNTER — Other Ambulatory Visit: Payer: Self-pay

## 2022-07-03 ENCOUNTER — Other Ambulatory Visit: Payer: Self-pay | Admitting: Family Medicine

## 2022-07-03 ENCOUNTER — Encounter (HOSPITAL_COMMUNITY): Payer: Self-pay | Admitting: Emergency Medicine

## 2022-07-03 ENCOUNTER — Emergency Department (HOSPITAL_COMMUNITY): Payer: Medicare Other

## 2022-07-03 ENCOUNTER — Observation Stay (HOSPITAL_COMMUNITY)
Admission: EM | Admit: 2022-07-03 | Discharge: 2022-07-04 | Disposition: A | Discharge status: Home / Self Care | Payer: Medicare Other | Attending: Internal Medicine | Admitting: Internal Medicine

## 2022-07-03 ENCOUNTER — Observation Stay (HOSPITAL_COMMUNITY): Payer: Medicare Other

## 2022-07-03 DIAGNOSIS — T59811A Toxic effect of smoke, accidental (unintentional), initial encounter: Secondary | ICD-10-CM | POA: Diagnosis not present

## 2022-07-03 DIAGNOSIS — Z86718 Personal history of other venous thrombosis and embolism: Secondary | ICD-10-CM

## 2022-07-03 DIAGNOSIS — Z87891 Personal history of nicotine dependence: Secondary | ICD-10-CM | POA: Insufficient documentation

## 2022-07-03 DIAGNOSIS — Z79899 Other long term (current) drug therapy: Secondary | ICD-10-CM | POA: Insufficient documentation

## 2022-07-03 DIAGNOSIS — Z743 Need for continuous supervision: Secondary | ICD-10-CM | POA: Diagnosis not present

## 2022-07-03 DIAGNOSIS — R410 Disorientation, unspecified: Secondary | ICD-10-CM | POA: Diagnosis not present

## 2022-07-03 DIAGNOSIS — G9341 Metabolic encephalopathy: Principal | ICD-10-CM | POA: Diagnosis present

## 2022-07-03 DIAGNOSIS — J9601 Acute respiratory failure with hypoxia: Secondary | ICD-10-CM | POA: Diagnosis present

## 2022-07-03 DIAGNOSIS — R9389 Abnormal findings on diagnostic imaging of other specified body structures: Secondary | ICD-10-CM | POA: Diagnosis present

## 2022-07-03 DIAGNOSIS — Z96653 Presence of artificial knee joint, bilateral: Secondary | ICD-10-CM | POA: Diagnosis not present

## 2022-07-03 DIAGNOSIS — I499 Cardiac arrhythmia, unspecified: Secondary | ICD-10-CM | POA: Diagnosis not present

## 2022-07-03 DIAGNOSIS — J449 Chronic obstructive pulmonary disease, unspecified: Secondary | ICD-10-CM | POA: Diagnosis present

## 2022-07-03 DIAGNOSIS — R0902 Hypoxemia: Secondary | ICD-10-CM | POA: Diagnosis not present

## 2022-07-03 DIAGNOSIS — R918 Other nonspecific abnormal finding of lung field: Secondary | ICD-10-CM | POA: Insufficient documentation

## 2022-07-03 DIAGNOSIS — R4182 Altered mental status, unspecified: Secondary | ICD-10-CM

## 2022-07-03 DIAGNOSIS — D649 Anemia, unspecified: Secondary | ICD-10-CM | POA: Diagnosis present

## 2022-07-03 DIAGNOSIS — Z7901 Long term (current) use of anticoagulants: Secondary | ICD-10-CM | POA: Diagnosis not present

## 2022-07-03 DIAGNOSIS — R001 Bradycardia, unspecified: Secondary | ICD-10-CM | POA: Diagnosis not present

## 2022-07-03 DIAGNOSIS — I480 Paroxysmal atrial fibrillation: Secondary | ICD-10-CM | POA: Diagnosis not present

## 2022-07-03 DIAGNOSIS — I10 Essential (primary) hypertension: Secondary | ICD-10-CM | POA: Insufficient documentation

## 2022-07-03 DIAGNOSIS — Z85828 Personal history of other malignant neoplasm of skin: Secondary | ICD-10-CM | POA: Diagnosis not present

## 2022-07-03 DIAGNOSIS — R404 Transient alteration of awareness: Secondary | ICD-10-CM | POA: Diagnosis not present

## 2022-07-03 DIAGNOSIS — D509 Iron deficiency anemia, unspecified: Secondary | ICD-10-CM | POA: Diagnosis not present

## 2022-07-03 DIAGNOSIS — J439 Emphysema, unspecified: Secondary | ICD-10-CM | POA: Diagnosis not present

## 2022-07-03 LAB — CBC WITH DIFFERENTIAL/PLATELET
Abs Immature Granulocytes: 0.01 10*3/uL (ref 0.00–0.07)
Basophils Absolute: 0.1 10*3/uL (ref 0.0–0.1)
Basophils Relative: 1 %
Eosinophils Absolute: 0.3 10*3/uL (ref 0.0–0.5)
Eosinophils Relative: 5 %
HCT: 31.2 % — ABNORMAL LOW (ref 39.0–52.0)
Hemoglobin: 9.5 g/dL — ABNORMAL LOW (ref 13.0–17.0)
Immature Granulocytes: 0 %
Lymphocytes Relative: 31 %
Lymphs Abs: 1.7 10*3/uL (ref 0.7–4.0)
MCH: 25.4 pg — ABNORMAL LOW (ref 26.0–34.0)
MCHC: 30.4 g/dL (ref 30.0–36.0)
MCV: 83.4 fL (ref 80.0–100.0)
Monocytes Absolute: 0.5 10*3/uL (ref 0.1–1.0)
Monocytes Relative: 10 %
Neutro Abs: 2.9 10*3/uL (ref 1.7–7.7)
Neutrophils Relative %: 53 %
Platelets: 330 10*3/uL (ref 150–400)
RBC: 3.74 MIL/uL — ABNORMAL LOW (ref 4.22–5.81)
RDW: 14.8 % (ref 11.5–15.5)
WBC: 5.6 10*3/uL (ref 4.0–10.5)
nRBC: 0 % (ref 0.0–0.2)

## 2022-07-03 LAB — COMPREHENSIVE METABOLIC PANEL
ALT: 18 U/L (ref 0–44)
AST: 22 U/L (ref 15–41)
Albumin: 4 g/dL (ref 3.5–5.0)
Alkaline Phosphatase: 61 U/L (ref 38–126)
Anion gap: 10 (ref 5–15)
BUN: 17 mg/dL (ref 8–23)
CO2: 24 mmol/L (ref 22–32)
Calcium: 8.9 mg/dL (ref 8.9–10.3)
Chloride: 101 mmol/L (ref 98–111)
Creatinine, Ser: 1.06 mg/dL (ref 0.61–1.24)
GFR, Estimated: >60 mL/min (ref >60)
Glucose, Bld: 120 mg/dL — ABNORMAL HIGH (ref 70–99)
Potassium: 4 mmol/L (ref 3.5–5.1)
Sodium: 135 mmol/L (ref 135–145)
Total Bilirubin: 0.6 mg/dL (ref 0.3–1.2)
Total Protein: 7.2 g/dL (ref 6.5–8.1)

## 2022-07-03 LAB — I-STAT VENOUS BLOOD GAS, ED
Acid-base deficit: 2 mmol/L (ref 0.0–2.0)
Bicarbonate: 26 mmol/L (ref 20.0–28.0)
Calcium, Ion: 1.23 mmol/L (ref 1.15–1.40)
HCT: 31 % — ABNORMAL LOW (ref 39.0–52.0)
Hemoglobin: 10.5 g/dL — ABNORMAL LOW (ref 13.0–17.0)
O2 Saturation: 87 %
Potassium: 4.2 mmol/L (ref 3.5–5.1)
Sodium: 139 mmol/L (ref 135–145)
TCO2: 28 mmol/L (ref 22–32)
pCO2, Ven: 57.2 mmHg (ref 44–60)
pH, Ven: 7.265 (ref 7.25–7.43)
pO2, Ven: 62 mmHg — ABNORMAL HIGH (ref 32–45)

## 2022-07-03 LAB — TYPE AND SCREEN
ABO/RH(D): A POS
Antibody Screen: NEGATIVE

## 2022-07-03 LAB — URINALYSIS, ROUTINE W REFLEX MICROSCOPIC
Bilirubin Urine: NEGATIVE
Glucose, UA: NEGATIVE mg/dL
Hgb urine dipstick: NEGATIVE
Ketones, ur: NEGATIVE mg/dL
Leukocytes,Ua: NEGATIVE
Nitrite: NEGATIVE
Protein, ur: NEGATIVE mg/dL
Specific Gravity, Urine: 1.012 (ref 1.005–1.030)
pH: 6 (ref 5.0–8.0)

## 2022-07-03 LAB — COOXEMETRY PANEL
Carboxyhemoglobin: 0.9 % (ref 0.5–1.5)
Methemoglobin: <0.7 % (ref 0.0–1.5)
O2 Saturation: 27.5 %
Total hemoglobin: 8.9 g/dL — ABNORMAL LOW (ref 12.0–16.0)

## 2022-07-03 LAB — PROCALCITONIN: Procalcitonin: <0.1 ng/mL

## 2022-07-03 LAB — TSH: TSH: 2.675 u[IU]/mL (ref 0.350–4.500)

## 2022-07-03 MED ORDER — DEXTROSE 5 % IV SOLN
250.0000 mg | INTRAVENOUS | Status: DC
  Administered 2022-07-04: 250 mg via INTRAVENOUS
  Filled 2022-07-03: qty 2.5

## 2022-07-03 MED ORDER — ACETAMINOPHEN 325 MG PO TABS
325.0000 mg | ORAL_TABLET | Freq: Every evening | ORAL | Status: DC
  Administered 2022-07-03: 325 mg via ORAL
  Filled 2022-07-03: qty 1

## 2022-07-03 MED ORDER — FUROSEMIDE 40 MG PO TABS
40.0000 mg | ORAL_TABLET | Freq: Every day | ORAL | Status: DC
  Administered 2022-07-04: 40 mg via ORAL
  Filled 2022-07-03: qty 1

## 2022-07-03 MED ORDER — ONDANSETRON HCL 4 MG/2ML IJ SOLN: 4.0000 mg | Freq: Four times a day (QID) | INTRAMUSCULAR | Status: DC | PRN

## 2022-07-03 MED ORDER — ACETAMINOPHEN 325 MG PO TABS
650.0000 mg | ORAL_TABLET | Freq: Four times a day (QID) | ORAL | Status: DC | PRN
  Administered 2022-07-03: 650 mg via ORAL
  Filled 2022-07-03: qty 2

## 2022-07-03 MED ORDER — ASPIRIN 81 MG PO CHEW
81.0000 mg | CHEWABLE_TABLET | Freq: Every day | ORAL | Status: DC
  Administered 2022-07-03: 81 mg via ORAL
  Filled 2022-07-03: qty 1

## 2022-07-03 MED ORDER — SODIUM CHLORIDE 0.9 % IV SOLN
2.0000 g | INTRAVENOUS | Status: DC
  Administered 2022-07-04: 2 g via INTRAVENOUS
  Filled 2022-07-03: qty 20

## 2022-07-03 MED ORDER — PANTOPRAZOLE SODIUM 40 MG PO TBEC
40.0000 mg | DELAYED_RELEASE_TABLET | Freq: Every day | ORAL | Status: DC
  Administered 2022-07-03 – 2022-07-04 (×2): 40 mg via ORAL
  Filled 2022-07-03 (×2): qty 1

## 2022-07-03 MED ORDER — SODIUM CHLORIDE 0.9% FLUSH
3.0000 mL | Freq: Two times a day (BID) | INTRAVENOUS | Status: DC
  Administered 2022-07-03 (×2): 3 mL via INTRAVENOUS

## 2022-07-03 MED ORDER — FLUTICASONE FUROATE-VILANTEROL 100-25 MCG/ACT IN AEPB
1.0000 | INHALATION_SPRAY | Freq: Every day | RESPIRATORY_TRACT | Status: DC
  Administered 2022-07-03 – 2022-07-04 (×2): 1 via RESPIRATORY_TRACT
  Filled 2022-07-03: qty 28

## 2022-07-03 MED ORDER — ACETAMINOPHEN 650 MG RE SUPP: 650.0000 mg | Freq: Four times a day (QID) | RECTAL | Status: DC | PRN

## 2022-07-03 MED ORDER — RIVAROXABAN 20 MG PO TABS
20.0000 mg | ORAL_TABLET | Freq: Every day | ORAL | Status: DC
  Administered 2022-07-03: 20 mg via ORAL
  Filled 2022-07-03: qty 1

## 2022-07-03 MED ORDER — ZOLPIDEM TARTRATE 5 MG PO TABS
5.0000 mg | ORAL_TABLET | Freq: Every evening | ORAL | Status: DC | PRN
  Administered 2022-07-03: 5 mg via ORAL
  Filled 2022-07-03: qty 1

## 2022-07-03 MED ORDER — ALBUTEROL SULFATE (2.5 MG/3ML) 0.083% IN NEBU: 2.5000 mg | INHALATION_SOLUTION | RESPIRATORY_TRACT | Status: DC | PRN

## 2022-07-03 MED ORDER — TRIAMCINOLONE ACETONIDE 0.1 % EX CREA: 1.0000 | TOPICAL_CREAM | Freq: Two times a day (BID) | CUTANEOUS | Status: DC | PRN

## 2022-07-03 MED ORDER — GABAPENTIN 300 MG PO CAPS
300.0000 mg | ORAL_CAPSULE | Freq: Two times a day (BID) | ORAL | Status: DC
  Administered 2022-07-03 – 2022-07-04 (×2): 300 mg via ORAL
  Filled 2022-07-03 (×2): qty 1

## 2022-07-03 MED ORDER — AMLODIPINE BESYLATE 5 MG PO TABS
5.0000 mg | ORAL_TABLET | Freq: Every day | ORAL | Status: DC
  Administered 2022-07-03: 5 mg via ORAL
  Filled 2022-07-03: qty 1

## 2022-07-03 MED ORDER — UMECLIDINIUM BROMIDE 62.5 MCG/ACT IN AEPB
1.0000 | INHALATION_SPRAY | Freq: Every day | RESPIRATORY_TRACT | Status: DC
  Administered 2022-07-03 – 2022-07-04 (×2): 1 via RESPIRATORY_TRACT
  Filled 2022-07-03: qty 7

## 2022-07-03 MED ORDER — MECLIZINE HCL 25 MG PO TABS: 25.0000 mg | ORAL_TABLET | Freq: Three times a day (TID) | ORAL | Status: DC | PRN

## 2022-07-03 MED ORDER — ONDANSETRON HCL 4 MG PO TABS: 4.0000 mg | ORAL_TABLET | Freq: Four times a day (QID) | ORAL | Status: DC | PRN

## 2022-07-03 MED ORDER — IRBESARTAN 300 MG PO TABS
300.0000 mg | ORAL_TABLET | Freq: Every day | ORAL | Status: DC
  Administered 2022-07-03 – 2022-07-04 (×2): 300 mg via ORAL
  Filled 2022-07-03 (×2): qty 1
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
## 2022-07-03 NOTE — ED Notes (Signed)
Mouth presented with no edema,or soot upon evaluation, minor redness on tongue
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
## 2022-07-03 NOTE — ED Notes (Signed)
Patient transported to CT 
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
## 2022-07-04 DIAGNOSIS — G9341 Metabolic encephalopathy: Secondary | ICD-10-CM | POA: Diagnosis not present
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
## 2022-07-04 NOTE — Progress Notes (Signed)
Discharge instructions given. Patient verbalized understanding and all questions were answered.  

## 2022-07-05 NOTE — Telephone Encounter (Signed)
Requested Prescriptions  Pending Prescriptions Disp Refills   amLODipine (NORVASC) 5 MG tablet [Pharmacy Med Name: Amlodipine Besylate 29m Tablet] 90 tablet 0    Sig: Take 1 tablet by mouth at bedtime     Cardiovascular: Calcium Channel Blockers 2 Failed - 07/03/2022  1:08 AM      Failed - Valid encounter within last 6 months    Recent Outpatient Visits           10 months ago Chronic obstructive pulmonary disease, unspecified COPD type ([place]   [place] WCammie Mcgee MD   11 months ago Left sided sciatica   [place]   1 year ago [place] WCammie Mcgee [place]   1 year ago S/P total left hip arthroplasty   [name]   1 year ago Chronic obstructive pulmonary disease, unspecified COPD type ([place]   [facility] [place], WCammie Mcgee MD       Future Appointments             In 5 months Crenshaw, BDenice Bors MD [place]Port [place]BP in normal range    BP Readings from Last 1 Encounters:  07/04/22 136/82         Passed - Last Heart Rate in normal range    Pulse Readings from Last 1 Encounters:  07/04/22 77

## 2022-07-09 ENCOUNTER — Other Ambulatory Visit: Payer: Self-pay | Admitting: Family Medicine

## 2022-07-09 ENCOUNTER — Encounter: Payer: Self-pay | Admitting: Family Medicine

## 2022-07-09 NOTE — Telephone Encounter (Signed)
/  Unable to refill per protocol, rx request is too soon. Last refill 08/07/21 for 30 and 11 refills.   Requested Prescriptions  Pending Prescriptions Disp Refills   furosemide (LASIX) 40 MG tablet [Pharmacy Med Name: Furosemide 31m Tablet] 30 tablet 11    Sig: Take 1 tablet by mouth once daily     Cardiovascular:  Diuretics - Loop Failed - 07/09/2022 12:16 AM      Failed - Mg Level in normal range and within 180 days    Magnesium  Date Value Ref Range Status  02/01/2022 2.4 (H) 1.6 - 2.3 mg/dL Final         Failed - Valid encounter within last 6 months    Recent Outpatient Visits           11 months ago Chronic obstructive pulmonary disease, unspecified COPD type ([place]   [place] WCammie Mcgee MD   11 months ago Left sided sciatica   [place], WCammie Mcgee MD   1 year ago VTurinPSusy [name] MD   1 year ago S/P total left hip arthroplasty   [name]   1 year ago Chronic obstructive pulmonary disease, unspecified COPD type ([place]   [place], WCammie Mcgee MD       Future Appointments             In 5 months Crenshaw, BDenice Bors MD [place]at [place] normal range and within 180 days    Potassium  Date Value Ref Range Status  07/03/2022 4.2 3.5 - 5.1 mmol/L Final         Passed - Ca in normal range and within 180 days    Calcium  Date Value Ref Range Status  07/03/2022 8.9 8.9 - 10.3 mg/dL Final   Calcium, Ion  Date Value Ref Range Status  07/03/2022 1.23 1.15 - 1.40 mmol/L Final         Passed - Na in normal range and within 180 days    Sodium  Date Value Ref Range Status  07/03/2022 139 135 - 145 mmol/L Final  02/01/2022 140 134 - 144 mmol/L Final         Passed - Cr in normal range and within 180 days    Creat  Date Value Ref Range Status  06/22/2022 1.36  (H) 0.70 - 1.22 mg/dL Final   Creatinine, Ser  Date Value Ref Range Status  07/03/2022 1.06 0.61 - 1.24 mg/dL Final         Passed - Cl in normal range and within 180 days    Chloride  Date Value Ref Range Status  07/03/2022 101 98 - 111 mmol/L Final         Passed - Last BP in normal range    BP Readings from Last 1 Encounters:  07/04/22 136/82

## 2022-07-12 ENCOUNTER — Other Ambulatory Visit: Payer: Self-pay | Admitting: Family Medicine

## 2022-07-12 DIAGNOSIS — I10 Essential (primary) hypertension: Secondary | ICD-10-CM

## 2022-07-12 NOTE — Telephone Encounter (Signed)
Prescription Request  07/12/2022  Is this a "Controlled Substance" medicine? No  LOV: 05/04/2022  What is the name of the medication or equipment?   valsartan (DIOVAN) 80 MG tablet   Have you contacted your pharmacy to request a refill? Yes   Which pharmacy would you like this sent to?  [facility] [place] ([place]), [place] - [address]  ([place]) [place] [address] Phone: [phone] Fax: [phone]    Patient notified that their request is being sent to the clinical staff for review and that they should receive a response within 2 business days.   Please advise patient when refill sent in at [phone].

## 2022-07-13 MED ORDER — VALSARTAN 80 MG PO TABS: 80.0000 mg | ORAL_TABLET | Freq: Every day | ORAL | 0 refills | Status: DC

## 2022-07-13 NOTE — Telephone Encounter (Signed)
Requested Prescriptions  Pending Prescriptions Disp Refills   valsartan (DIOVAN) 80 MG tablet 90 tablet 0    Sig: Take 1 tablet (80 mg total) by mouth daily.     Cardiovascular:  Angiotensin Receptor Blockers Failed - 07/12/2022  1:18 PM      Failed - Valid encounter within last 6 months    Recent Outpatient Visits           11 months ago Chronic obstructive pulmonary disease, unspecified COPD type ([place])   [place] Pickard, Cammie Mcgee, MD   12 months ago Left sided sciatica   [place] Pickard, Cammie Mcgee, MD   1 year ago [place] [name], MD   1 year ago S/P total left hip arthroplasty   [place] [name], MD   1 year ago Chronic obstructive pulmonary disease, unspecified COPD type ([place])   [place] Pickard, Cammie Mcgee, MD       Future Appointments             In 4 months Crenshaw, Denice Bors, MD [place] [facility] [place] in normal range and within 180 days    Creat  Date Value Ref Range Status  06/22/2022 1.36 (H) 0.70 - 1.22 mg/dL Final   Creatinine, Ser  Date Value Ref Range Status  07/03/2022 1.06 0.61 - 1.24 mg/dL Final         Passed - K in normal range and within 180 days    Potassium  Date Value Ref Range Status  07/03/2022 4.2 3.5 - 5.1 mmol/L Final         Passed - Patient is not pregnant      Passed - Last BP in normal range    BP Readings from Last 1 Encounters:  07/04/22 136/82

## 2022-07-15 LAB — BASIC METABOLIC PANEL WITH GFR
BUN/Creatinine Ratio: 18 (calc) (ref 6–22)
BUN: 24 mg/dL (ref 7–25)
CO2: 25 mmol/L (ref 20–32)
Calcium: 9.3 mg/dL (ref 8.6–10.3)
Chloride: 104 mmol/L (ref 98–110)
Creat: 1.36 mg/dL — ABNORMAL HIGH (ref 0.70–1.22)
Glucose, Bld: 107 mg/dL — ABNORMAL HIGH (ref 65–99)
Potassium: 4.9 mmol/L (ref 3.5–5.3)
Sodium: 139 mmol/L (ref 135–146)
eGFR: 51 mL/min/{1.73_m2} — ABNORMAL LOW (ref >=60)

## 2022-07-15 LAB — B12 AND FOLATE PANEL
Folate: >24 ng/mL
Vitamin B-12: 597 pg/mL (ref 200–1100)

## 2022-07-15 LAB — ZINC

## 2022-07-15 LAB — IRON: Iron: 16 ug/dL — ABNORMAL LOW (ref 50–180)

## 2022-07-15 LAB — TEST AUTHORIZATION

## 2022-07-15 LAB — EXTRA SPECIMEN

## 2022-07-15 LAB — TSH: TSH: 3.21 mIU/L (ref 0.40–4.50)

## 2022-07-15 LAB — FERRITIN: Ferritin: 8 ng/mL — ABNORMAL LOW (ref 24–380)

## 2022-07-19 ENCOUNTER — Encounter: Payer: Self-pay | Admitting: Physician Assistant

## 2022-07-20 ENCOUNTER — Other Ambulatory Visit: Payer: Self-pay | Admitting: Family Medicine

## 2022-07-28 ENCOUNTER — Other Ambulatory Visit: Payer: Self-pay | Admitting: Family Medicine

## 2022-07-28 NOTE — Telephone Encounter (Signed)
Prescription Request  07/28/2022  LOV: 06/22/2022  What is the name of the medication or equipment?   amitriptyline (ELAVIL) 25 MG tablet [id number]  DISCONTINUED   Have you contacted your pharmacy to request a refill? Yes   Which pharmacy would you like this sent to?  [place] ([place]), [place] - [address] [place] ([place] [address] Phone: [phone] Fax: [phone]    Patient notified that their request is being sent to the clinical staff for review and that they should receive a response within 2 business days.   Please advise pharmacist.

## 2022-08-03 ENCOUNTER — Telehealth: Payer: Self-pay | Admitting: Family Medicine
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
## 2022-08-05 ENCOUNTER — Other Ambulatory Visit: Payer: Self-pay

## 2022-08-05 DIAGNOSIS — E611 Iron deficiency: Secondary | ICD-10-CM

## 2022-08-05 DIAGNOSIS — K146 Glossodynia: Secondary | ICD-10-CM

## 2022-08-05 MED ORDER — IRON (FERROUS SULFATE) 325 (65 FE) MG PO TABS: 325.0000 mg | ORAL_TABLET | Freq: Every day | ORAL | 1 refills | Status: AC

## 2022-08-24 NOTE — Progress Notes (Unsigned)
08/25/2022 Brendan HAGEDORN [id number] [birth year]  Referring provider: [name], MD Primary GI doctor: Dr. Carlean Purl  ASSESSMENT AND PLAN:   Iron deficiency anemia due to chronic blood loss EGD 2016 with small erosion, no NSAIDS, no ETOH Colonoscopy severe tics but unremarkable The patient is hemodynamically stable, no overt GI bleeding He does have significant iron deficiency anemia.   He will require diagnostic work-up including an upper and lower endoscopy.   I would like to see what his blood counts and iron look like at this point in time since being on the iron for 2 months. Depending on the iron indices may need to consider iron transfusion outpatient  ER precautions discussed with the patient: severe weakness/dizziness, severe AB pain, vomit blood, shortness of breath or chest pain.   Appropriate for [facility] Risk of bowel prep, conscious sedation, and EGD and colonoscopy were discussed. Risks include but are not limited to dehydration, pain, bleeding, cardiopulmonary process, bowel perforation, or other possible adverse outcomes.. Treatment plan was discussed with patient, and agreed upon.  Gastroesophageal reflux disease, unspecified whether esophagitis present Lifestyle changes discussed, avoid NSAIDS, ETOH Continue on the same medication, reports symptoms are well controlled.   History of adenomatous polyp of colon 12/27/2013 colonoscopy for personal history of adenomatous polyps excellent prep severe diverticulosis sigmoid colon no polyps removed.  Paroxysmal atrial fibrillation 07/2021 normal EF, normal stress test Patient told to hold his Xarelto for 2 days prior to time of procedure. Will instruct when and how to resume after procedure. We will communicate with his prescribing physician Dr. Stanford Breed to ensure that holding his Xarelto is acceptable. We discussed the risk, benefits and alternatives to colonoscopy/endoscopy.  We also discussed the low but real  risk of cardiovascular event such as heart attack, stroke, embolism, thrombosis or ischemia/infarct of other organs off Xarelto and explained the need to seek urgent help if this occurs.  He is agreeable and wishes to proceed.  Chronic obstructive pulmonary disease, unspecified COPD type Not on oxygen, no increase work of breathing, RA 94%    Patient Care Team: [name], MD as PCP - General (Family Medicine) Stanford Breed Denice Bors, MD as PCP - Cardiology (Cardiology) Edythe Clarity, [facility] as Pharmacist (Pharmacist)  HISTORY OF PRESENT ILLNESS: 87 y.o. male with a past medical history of skin cancer, COPD on trelegy, no O2, TIA, DVT after ankle surgery, atrial fibrillation on Xarelto, diverticulosis, fatty liver, GERD personal history of adenomatous polyps and others listed below presents for evaluation of IDA.   11/07/2014 EGD 1 erosion and mild erythematous antrum gastritis otherwise unremarkable 12/27/2013 colonoscopy for personal history of adenomatous polyps excellent prep severe diverticulosis sigmoid colon no polyps removed. Here to discuss possible colonoscopy 07/23/2021 myocardial perfusion study normal, EF 60%.   Most recent labs reviewed showed IDA 11/20/2021 Hgb 15.1 07/03/2022 while patient was in the hospital for AMS due to ambien HGB 10.5.  MCV 83.  Normal WBC normal platelets.   Creatinine 1.36, BUN 17, normal liver function. Ferritin at that time was 8, iron 16, normal B12, normal folate, normal zinc. He started on iron supplement in Feb and states he is doing better.   When he has BM in the morning, feels like he has a knot on his rectum or hard to clean.  PCP told him it was hemorrhoids, given hydrocortisone and that caused discomfort, tried vaseline but that did not help.  Rare BRB on TP if he wipes too much. Rare burning  and discomfort, no itching.  Patient has a BM every day.  Patient denies change in bowel habits, constipation, diarrhea. Denies changes in  appetite, unintentional weight loss. States he has gained weight.  Patient reports GERD well controlled with protonix.  He has had pneumonia several times in last few weeks, has some hoarseness and cough from this.  Patient denies dysphagia, nausea, vomiting, melena.  He drives, lives by himself, has small dog.   Patient denies family history of colon cancer or other gastrointestinal malignancies.  Wt Readings from Last 3 Encounters:  08/25/22 256 lb 6.4 oz (116.3 kg)  07/03/22 253 lb 8.5 oz (115 kg)  06/22/22 254 lb (115.2 kg)     He  reports that he quit smoking about 15 years ago. His smoking use included cigarettes. He has never used smokeless tobacco. He reports that he does not drink alcohol and does not use drugs.  RELEVANT LABS AND IMAGING: CBC    Component Value Date/Time   WBC 5.6 07/03/2022 0542   RBC 3.74 (L) 07/03/2022 0542   HGB 10.5 (L) 07/03/2022 0656   HCT 31.0 (L) 07/03/2022 0656   PLT 330 07/03/2022 0542   MCV 83.4 07/03/2022 0542   MCH 25.4 (L) 07/03/2022 0542   MCHC 30.4 07/03/2022 0542   RDW 14.8 07/03/2022 0542   LYMPHSABS 1.7 07/03/2022 0542   MONOABS 0.5 07/03/2022 0542   EOSABS 0.3 07/03/2022 0542   BASOSABS 0.1 07/03/2022 0542   Recent Labs    11/20/21 1600 07/03/22 0542 07/03/22 0656  HGB 15.1 9.5* 10.5*     CMP     Component Value Date/Time   NA 139 07/03/2022 0656   NA 140 02/01/2022 1422   K 4.2 07/03/2022 0656   CL 101 07/03/2022 0542   CO2 24 07/03/2022 0542   GLUCOSE 120 (H) 07/03/2022 0542   BUN 17 07/03/2022 0542   BUN 18 02/01/2022 1422   CREATININE 1.06 07/03/2022 0542   CREATININE 1.36 (H) 06/22/2022 1038   CALCIUM 8.9 07/03/2022 0542   PROT 7.2 07/03/2022 0542   ALBUMIN 4.0 07/03/2022 0542   AST 22 07/03/2022 0542   ALT 18 07/03/2022 0542   ALKPHOS 61 07/03/2022 0542   BILITOT 0.6 07/03/2022 0542   GFRNONAA >60 07/03/2022 0542   GFRNONAA 67 05/06/2020 1246   GFRAA 77 05/06/2020 1246      Latest Ref Rng &  Units 07/03/2022    5:42 AM 08/13/2021   12:03 PM 03/05/2021   12:32 PM  Hepatic Function  Total Protein 6.5 - 8.1 g/dL 7.2  7.1  6.2   Albumin 3.5 - 5.0 g/dL 4.0     AST 15 - 41 U/L 22  21  18    ALT 0 - 44 U/L 18  16  19    Alk Phosphatase 38 - 126 U/L 61     Total Bilirubin 0.3 - 1.2 mg/dL 0.6  0.8  0.5       Current Medications:    Current Outpatient Medications (Cardiovascular):    amLODipine (NORVASC) 5 MG tablet, Take 1 tablet by mouth at bedtime   furosemide (LASIX) 40 MG tablet, Take 1 tablet by mouth once daily   metoprolol succinate (TOPROL-XL) 25 MG 24 hr tablet, Take 1 tablet (25 mg total) by mouth daily.   valsartan (DIOVAN) 80 MG tablet, Take 1 tablet (80 mg total) by mouth daily.  Current Outpatient Medications (Respiratory):    albuterol (VENTOLIN HFA) 108 (90 Base) MCG/ACT inhaler, Inhale 1 to  2  puffs by mouth every 6 hours as needed for shortness of breath   Fluticasone-Umeclidin-Vilant (TRELEGY ELLIPTA) 100-62.5-25 MCG/ACT AEPB, Inhale 1 puff into the lungs daily.  Current Outpatient Medications (Analgesics):    acetaminophen (TYLENOL) 325 MG tablet, Take 325 mg by mouth every evening.   aspirin 81 MG chewable tablet, Chew 81 mg by mouth at bedtime.  Current Outpatient Medications (Hematological):    Iron, Ferrous Sulfate, 325 (65 Fe) MG TABS, Take 325 mg by mouth daily.   rivaroxaban (XARELTO) 20 MG TABS tablet, Take 1 tablet (20 mg total) by mouth daily with supper.  Current Outpatient Medications (Other):    gabapentin (NEURONTIN) 300 MG capsule, TAKE 1 CAPSULE BY MOUTH THREE TIMES DAILY AS NEEDED FOR  SCIATICA (Patient taking differently: Take 300 mg by mouth 2 (two) times daily.)   hydrocortisone 2.5 % cream, Apply topically 2 (two) times daily.   meclizine (ANTIVERT) 25 MG tablet, Take 1 tablet by mouth 3 times a day as needed   Multiple Vitamin (MULTIVITAMIN WITH MINERALS) TABS, Take 1 tablet by mouth daily.   Omega-3 Fatty Acids (FISH OIL) 1200 MG  CAPS, Take 1,200 mg by mouth every evening.   pantoprazole (PROTONIX) 40 MG tablet, Take 1 tablet by mouth every day   triamcinolone cream (KENALOG) 0.1 %, Apply 1 application  topically 2 (two) times daily as needed (irritation).   zolpidem (AMBIEN) 10 MG tablet, Take 1 tablet by mouth at bedtime as needed for sleep  Medical History:  Past Medical History:  Diagnosis Date   Acute deep vein thrombosis (DVT) of tibial vein of right lower extremity    after ankle fracture/surgery (2/21)   Arthritis    RIGHT HIP, HANDS, BACK   Cancer    Skin cancer    Cataract    Cervical radiculopathy    severe C3-4 neuroforaminal stenosis, C5-6 left synovial cyst compressing left nerve root.   Colon polyps    2005   COPD (chronic obstructive pulmonary disease)    SMOKED FOR 18 YRS   Diverticulosis    2005   Fatty liver    GERD (gastroesophageal reflux disease)    Hard of hearing    History of kidney stones    Hypertension    Leg fracture, right    Pain    RIGHT HIP AND BACK   Personal history of colonic polyps-adenoma and sessile serrated polyp 07/15/2010   Pneumonia 03/2019   RBBB    POSS HX AF IN PAST- PAC'S   Shortness of breath    WITH EXERTION ONLY   Stroke    TIA   TIA (transient ischemic attack)    Wears glasses    Allergies: No Known Allergies   Surgical History:  He  has a past surgical history that includes Total hip arthroplasty (Right, 01/09/2013); Colonoscopy; Colonoscopy w/ polypectomy; ORIF ankle fracture (Right, 07/11/2019); ORIF ankle fracture (Right, 07/11/2019); skin cancers removed ; Cystoscopy with retrograde pyelogram, ureteroscopy and stent placement (Left, 12/11/2019); and Total hip arthroplasty (Left, 02/24/2021). Family History:  His family history includes Cancer in his father and mother.  REVIEW OF SYSTEMS  : All other systems reviewed and negative except where noted in the History of Present Illness.  PHYSICAL EXAM: BP 120/70   Pulse 60   Ht 5\' 11"  (1.803  m)   Wt 256 lb 6.4 oz (116.3 kg)   SpO2 94%   BMI 35.76 kg/m  General Appearance: Obese in no apparent distress. Head:   Normocephalic  and atraumatic. Eyes:  sclerae anicteric,conjunctive pink  Respiratory: Respiratory effort normal, BS equal bilaterally without rales, rhonchi, wheezing. Cardio: Irreg, irreg, with no MRG Peripheral pulses intact.  Abdomen: Soft,  Obese ,active bowel sounds. No tenderness . Without guarding and Without rebound. No masses. Rectal: declines, wearing suspenders and states he prefers to wait until colonoscopy. Musculoskeletal: Full ROM, Antalgic gait but able to get on table with minimal assistance With 2+edema, statis dermatitis Skin:  Dry and intact without significant lesions or rashes Neuro: Alert and  oriented x4;  No focal deficits. Psych:  Cooperative. Normal mood and affect.    Vladimir Crofts, PA-C 9:38 AM

## 2022-08-25 ENCOUNTER — Encounter: Payer: Self-pay | Admitting: Physician Assistant

## 2022-08-25 ENCOUNTER — Other Ambulatory Visit (INDEPENDENT_AMBULATORY_CARE_PROVIDER_SITE_OTHER): Payer: Medicare Other

## 2022-08-25 ENCOUNTER — Telehealth: Payer: Self-pay

## 2022-08-25 ENCOUNTER — Ambulatory Visit: Payer: Medicare Other | Admitting: Physician Assistant

## 2022-08-25 VITALS — BP 120/70 | HR 60 | Ht 71.0 in | Wt 256.4 lb

## 2022-08-25 DIAGNOSIS — I480 Paroxysmal atrial fibrillation: Secondary | ICD-10-CM

## 2022-08-25 DIAGNOSIS — Z86010 Personal history of colonic polyps: Secondary | ICD-10-CM

## 2022-08-25 DIAGNOSIS — K219 Gastro-esophageal reflux disease without esophagitis: Secondary | ICD-10-CM

## 2022-08-25 DIAGNOSIS — D500 Iron deficiency anemia secondary to blood loss (chronic): Secondary | ICD-10-CM

## 2022-08-25 DIAGNOSIS — J449 Chronic obstructive pulmonary disease, unspecified: Secondary | ICD-10-CM | POA: Diagnosis not present

## 2022-08-25 LAB — IBC + FERRITIN
Ferritin: 23.6 ng/mL (ref 22.0–322.0)
Iron: 356 ug/dL — ABNORMAL HIGH (ref 42–165)
Saturation Ratios: 90.2 % — ABNORMAL HIGH (ref 20.0–50.0)
TIBC: 394.8 ug/dL (ref 250.0–450.0)
Transferrin: 282 mg/dL (ref 212.0–360.0)

## 2022-08-25 LAB — CBC WITH DIFFERENTIAL/PLATELET
Basophils Absolute: 0.1 10*3/uL (ref 0.0–0.1)
Basophils Relative: 1.3 % (ref 0.0–3.0)
Eosinophils Absolute: 0.3 10*3/uL (ref 0.0–0.7)
Eosinophils Relative: 3.9 % (ref 0.0–5.0)
HCT: 37.8 % — ABNORMAL LOW (ref 39.0–52.0)
Hemoglobin: 12 g/dL — ABNORMAL LOW (ref 13.0–17.0)
Lymphocytes Relative: 20.5 % (ref 12.0–46.0)
Lymphs Abs: 1.7 10*3/uL (ref 0.7–4.0)
MCHC: 31.6 g/dL (ref 30.0–36.0)
MCV: 85.1 fl (ref 78.0–100.0)
Monocytes Absolute: 0.8 10*3/uL (ref 0.1–1.0)
Monocytes Relative: 9.3 % (ref 3.0–12.0)
Neutro Abs: 5.5 10*3/uL (ref 1.4–7.7)
Neutrophils Relative %: 65 % (ref 43.0–77.0)
Platelets: 263 10*3/uL (ref 150.0–400.0)
RBC: 4.44 Mil/uL (ref 4.22–5.81)
RDW: 24.2 % — ABNORMAL HIGH (ref 11.5–15.5)
WBC: 8.5 10*3/uL (ref 4.0–10.5)

## 2022-08-25 LAB — COMPREHENSIVE METABOLIC PANEL
ALT: 12 U/L (ref 0–53)
AST: 18 U/L (ref 0–37)
Albumin: 4.5 g/dL (ref 3.5–5.2)
Alkaline Phosphatase: 65 U/L (ref 39–117)
BUN: 21 mg/dL (ref 6–23)
CO2: 25 mEq/L (ref 19–32)
Calcium: 9.2 mg/dL (ref 8.4–10.5)
Chloride: 103 mEq/L (ref 96–112)
Creatinine, Ser: 1.15 mg/dL (ref 0.40–1.50)
GFR: 58.51 mL/min — ABNORMAL LOW (ref >60.00)
Glucose, Bld: 113 mg/dL — ABNORMAL HIGH (ref 70–99)
Potassium: 4.2 mEq/L (ref 3.5–5.1)
Sodium: 137 mEq/L (ref 135–145)
Total Bilirubin: 0.7 mg/dL (ref 0.2–1.2)
Total Protein: 7.3 g/dL (ref 6.0–8.3)

## 2022-08-25 MED ORDER — NA SULFATE-K SULFATE-MG SULF 17.5-3.13-1.6 GM/177ML PO SOLN: 1.0000 | Freq: Once | ORAL | 0 refills | Status: AC

## 2022-08-25 NOTE — Telephone Encounter (Signed)
Patient with diagnosis of PAF(paroxysmal atrial fibrillation)  on Xarelto for anticoagulation.    Procedure: EDG & Colon  Date of procedure: 09/08/2022   CHA2DS2-VASc Score = 5   This indicates a 7.2% annual risk of stroke. The patient's score is based upon: CHF History: 0 HTN History: 1 Diabetes History: 0 Stroke History: 2 Vascular Disease History: 0 Age Score: 2 Gender Score: 0     CrCl 67 mL/min (SrCr. 1.06 mg/dl on 07/03/2022)  Platelet count 330   Per office protocol, patient can hold Xarelto  for 2 days prior to procedure.     **This guidance is not considered finalized until pre-operative APP has relayed final recommendations.**

## 2022-08-25 NOTE — Telephone Encounter (Signed)
[facility] Pre-operative Risk Assessment     Request for surgical clearance:     Endoscopy Procedure  What type of surgery is being performed?     EDG & Colon  When is this surgery scheduled?     09/08/22  What type of clearance is required ?   Pharmacy  Are there any medications that need to be held prior to surgery and how long? Eliquis & 2 days  Practice name and name of physician performing surgery?      [facility]  What is your office phone and fax number?      Phone- [phone]  [phone]  Anesthesia type (None, local, MAC, general) ?       MAC

## 2022-08-25 NOTE — Patient Instructions (Addendum)
Your provider has requested that you go to the basement level for lab work before leaving today. Press "B" on the elevator. The lab is located at the first door on the left as you exit the elevator.  You have been scheduled for an endoscopy and colonoscopy. Please follow the written instructions given to you at your visit today. Please pick up your prep supplies at the pharmacy within the next 1-3 days. If you use inhalers (even only as needed), please bring them with you on the day of your procedure.  Try the destin for your rectum, can get from pharmacy/any store over the counter.   Due to recent changes in healthcare laws, you may see the results of your imaging and laboratory studies on MyChart before your provider has had a chance to review them.  We understand that in some cases there may be results that are confusing or concerning to you. Not all laboratory results come back in the same time frame and the provider may be waiting for multiple results in order to interpret others.  Please give [place] 48 hours in order for your provider to thoroughly review all the results before contacting the office for clarification of your results.    Thank you for trusting me with your gastrointestinal care!

## 2022-08-25 NOTE — Telephone Encounter (Signed)
Pharmacy please advise on holding Xarelto prior to EGD and colonoscopy scheduled for 09/08/2022. Thank you.

## 2022-08-25 NOTE — Telephone Encounter (Signed)
Error

## 2022-08-25 NOTE — Telephone Encounter (Signed)
   Patient Name: Brendan Hopkins  DOB: 16-May-1936 MRN: [id number]  Primary Cardiologist: Kirk Ruths, MD  Clinical pharmacists have reviewed the patient's past medical history, labs, and current medications as part of preoperative protocol coverage. The following recommendations have been made:  Per office protocol, patient can hold Xarelto  for 2 days prior to procedure.    I will route this recommendation to the requesting party via Epic fax function and remove from pre-op pool.  Please call with questions.  Mable Fill, Marissa Nestle, NP 08/25/2022, 3:06 PM

## 2022-08-26 NOTE — Telephone Encounter (Signed)
Contacte pt & pt is aware to hold Eliquis 2 days prior to his procedure per cardiology.

## 2022-08-27 ENCOUNTER — Other Ambulatory Visit: Payer: Self-pay | Admitting: Family Medicine

## 2022-08-27 ENCOUNTER — Other Ambulatory Visit: Payer: Self-pay

## 2022-08-27 ENCOUNTER — Telehealth: Payer: Self-pay | Admitting: Family Medicine

## 2022-08-27 ENCOUNTER — Emergency Department (HOSPITAL_COMMUNITY): Payer: Medicare Other

## 2022-08-27 ENCOUNTER — Observation Stay (HOSPITAL_COMMUNITY): Payer: Medicare Other

## 2022-08-27 ENCOUNTER — Inpatient Hospital Stay (HOSPITAL_COMMUNITY)
Admission: EM | Admit: 2022-08-27 | Discharge: 2022-08-31 | DRG: 193 | Disposition: A | Discharge status: Home / Self Care | Payer: Medicare Other | Attending: Family Medicine | Admitting: Family Medicine

## 2022-08-27 DIAGNOSIS — Z743 Need for continuous supervision: Secondary | ICD-10-CM | POA: Diagnosis not present

## 2022-08-27 DIAGNOSIS — K219 Gastro-esophageal reflux disease without esophagitis: Secondary | ICD-10-CM | POA: Diagnosis not present

## 2022-08-27 DIAGNOSIS — R0902 Hypoxemia: Secondary | ICD-10-CM | POA: Diagnosis not present

## 2022-08-27 DIAGNOSIS — I129 Hypertensive chronic kidney disease with stage 1 through stage 4 chronic kidney disease, or unspecified chronic kidney disease: Secondary | ICD-10-CM | POA: Diagnosis present

## 2022-08-27 DIAGNOSIS — Z86718 Personal history of other venous thrombosis and embolism: Secondary | ICD-10-CM

## 2022-08-27 DIAGNOSIS — I480 Paroxysmal atrial fibrillation: Secondary | ICD-10-CM | POA: Diagnosis not present

## 2022-08-27 DIAGNOSIS — R059 Cough, unspecified: Secondary | ICD-10-CM | POA: Insufficient documentation

## 2022-08-27 DIAGNOSIS — M479 Spondylosis, unspecified: Secondary | ICD-10-CM | POA: Diagnosis not present

## 2022-08-27 DIAGNOSIS — Z1152 Encounter for screening for COVID-19: Secondary | ICD-10-CM | POA: Diagnosis not present

## 2022-08-27 DIAGNOSIS — J9601 Acute respiratory failure with hypoxia: Secondary | ICD-10-CM | POA: Diagnosis not present

## 2022-08-27 DIAGNOSIS — K760 Fatty (change of) liver, not elsewhere classified: Secondary | ICD-10-CM | POA: Diagnosis not present

## 2022-08-27 DIAGNOSIS — I679 Cerebrovascular disease, unspecified: Secondary | ICD-10-CM | POA: Diagnosis not present

## 2022-08-27 DIAGNOSIS — R0789 Other chest pain: Secondary | ICD-10-CM | POA: Diagnosis not present

## 2022-08-27 DIAGNOSIS — Z79899 Other long term (current) drug therapy: Secondary | ICD-10-CM

## 2022-08-27 DIAGNOSIS — N1831 Chronic kidney disease, stage 3a: Secondary | ICD-10-CM | POA: Insufficient documentation

## 2022-08-27 DIAGNOSIS — Z85828 Personal history of other malignant neoplasm of skin: Secondary | ICD-10-CM

## 2022-08-27 DIAGNOSIS — I499 Cardiac arrhythmia, unspecified: Secondary | ICD-10-CM | POA: Diagnosis not present

## 2022-08-27 DIAGNOSIS — Z8701 Personal history of pneumonia (recurrent): Secondary | ICD-10-CM

## 2022-08-27 DIAGNOSIS — M1611 Unilateral primary osteoarthritis, right hip: Secondary | ICD-10-CM | POA: Diagnosis present

## 2022-08-27 DIAGNOSIS — R0602 Shortness of breath: Secondary | ICD-10-CM | POA: Diagnosis present

## 2022-08-27 DIAGNOSIS — Z7901 Long term (current) use of anticoagulants: Secondary | ICD-10-CM

## 2022-08-27 DIAGNOSIS — Z96643 Presence of artificial hip joint, bilateral: Secondary | ICD-10-CM | POA: Diagnosis present

## 2022-08-27 DIAGNOSIS — M19041 Primary osteoarthritis, right hand: Secondary | ICD-10-CM | POA: Diagnosis present

## 2022-08-27 DIAGNOSIS — I82441 Acute embolism and thrombosis of right tibial vein: Secondary | ICD-10-CM | POA: Diagnosis present

## 2022-08-27 DIAGNOSIS — M19042 Primary osteoarthritis, left hand: Secondary | ICD-10-CM | POA: Diagnosis not present

## 2022-08-27 DIAGNOSIS — J441 Chronic obstructive pulmonary disease with (acute) exacerbation: Secondary | ICD-10-CM | POA: Diagnosis not present

## 2022-08-27 DIAGNOSIS — J439 Emphysema, unspecified: Secondary | ICD-10-CM | POA: Diagnosis not present

## 2022-08-27 DIAGNOSIS — R053 Chronic cough: Secondary | ICD-10-CM | POA: Diagnosis not present

## 2022-08-27 DIAGNOSIS — Z7951 Long term (current) use of inhaled steroids: Secondary | ICD-10-CM

## 2022-08-27 DIAGNOSIS — R6889 Other general symptoms and signs: Secondary | ICD-10-CM

## 2022-08-27 DIAGNOSIS — J13 Pneumonia due to Streptococcus pneumoniae: Secondary | ICD-10-CM | POA: Diagnosis present

## 2022-08-27 DIAGNOSIS — J449 Chronic obstructive pulmonary disease, unspecified: Secondary | ICD-10-CM | POA: Diagnosis present

## 2022-08-27 DIAGNOSIS — Z6833 Body mass index (BMI) 33.0-33.9, adult: Secondary | ICD-10-CM

## 2022-08-27 DIAGNOSIS — E669 Obesity, unspecified: Secondary | ICD-10-CM | POA: Diagnosis present

## 2022-08-27 DIAGNOSIS — J189 Pneumonia, unspecified organism: Secondary | ICD-10-CM | POA: Diagnosis not present

## 2022-08-27 DIAGNOSIS — Z87891 Personal history of nicotine dependence: Secondary | ICD-10-CM

## 2022-08-27 DIAGNOSIS — Z8673 Personal history of transient ischemic attack (TIA), and cerebral infarction without residual deficits: Secondary | ICD-10-CM | POA: Diagnosis not present

## 2022-08-27 DIAGNOSIS — Z86010 Personal history of colonic polyps: Secondary | ICD-10-CM

## 2022-08-27 DIAGNOSIS — R112 Nausea with vomiting, unspecified: Secondary | ICD-10-CM | POA: Diagnosis not present

## 2022-08-27 DIAGNOSIS — R000 Tachycardia, unspecified: Secondary | ICD-10-CM | POA: Diagnosis not present

## 2022-08-27 DIAGNOSIS — Z7982 Long term (current) use of aspirin: Secondary | ICD-10-CM

## 2022-08-27 DIAGNOSIS — Z87442 Personal history of urinary calculi: Secondary | ICD-10-CM

## 2022-08-27 DIAGNOSIS — R079 Chest pain, unspecified: Secondary | ICD-10-CM | POA: Diagnosis not present

## 2022-08-27 DIAGNOSIS — I4510 Unspecified right bundle-branch block: Secondary | ICD-10-CM | POA: Diagnosis present

## 2022-08-27 LAB — CBC WITH DIFFERENTIAL/PLATELET
Abs Immature Granulocytes: 0.06 10*3/uL (ref 0.00–0.07)
Basophils Absolute: 0.1 10*3/uL (ref 0.0–0.1)
Basophils Relative: 0 %
Eosinophils Absolute: 0.1 10*3/uL (ref 0.0–0.5)
Eosinophils Relative: 0 %
HCT: 37.6 % — ABNORMAL LOW (ref 39.0–52.0)
Hemoglobin: 11.7 g/dL — ABNORMAL LOW (ref 13.0–17.0)
Immature Granulocytes: 0 %
Lymphocytes Relative: 5 %
Lymphs Abs: 0.7 10*3/uL (ref 0.7–4.0)
MCH: 27.3 pg (ref 26.0–34.0)
MCHC: 31.1 g/dL (ref 30.0–36.0)
MCV: 87.9 fL (ref 80.0–100.0)
Monocytes Absolute: 1 10*3/uL (ref 0.1–1.0)
Monocytes Relative: 7 %
Neutro Abs: 13 10*3/uL — ABNORMAL HIGH (ref 1.7–7.7)
Neutrophils Relative %: 88 %
Platelets: 231 10*3/uL (ref 150–400)
RBC: 4.28 MIL/uL (ref 4.22–5.81)
RDW: 22.2 % — ABNORMAL HIGH (ref 11.5–15.5)
Smear Review: NORMAL
WBC: 14.8 10*3/uL — ABNORMAL HIGH (ref 4.0–10.5)
nRBC: 0 % (ref 0.0–0.2)

## 2022-08-27 LAB — URINALYSIS, W/ REFLEX TO CULTURE (INFECTION SUSPECTED)
Bacteria, UA: NONE SEEN
Bilirubin Urine: NEGATIVE
Glucose, UA: NEGATIVE mg/dL
Hgb urine dipstick: NEGATIVE
Ketones, ur: NEGATIVE mg/dL
Leukocytes,Ua: NEGATIVE
Nitrite: NEGATIVE
Protein, ur: NEGATIVE mg/dL
Specific Gravity, Urine: 1.009 (ref 1.005–1.030)
pH: 5 (ref 5.0–8.0)

## 2022-08-27 LAB — LACTIC ACID, PLASMA
Lactic Acid, Venous: 1.9 mmol/L (ref 0.5–1.9)
Lactic Acid, Venous: 1.3 mmol/L (ref 0.5–1.9)

## 2022-08-27 LAB — COMPREHENSIVE METABOLIC PANEL
ALT: 17 U/L (ref 0–44)
AST: 25 U/L (ref 15–41)
Albumin: 3.9 g/dL (ref 3.5–5.0)
Alkaline Phosphatase: 64 U/L (ref 38–126)
Anion gap: 16 — ABNORMAL HIGH (ref 5–15)
BUN: 24 mg/dL — ABNORMAL HIGH (ref 8–23)
CO2: 20 mmol/L — ABNORMAL LOW (ref 22–32)
Calcium: 8.7 mg/dL — ABNORMAL LOW (ref 8.9–10.3)
Chloride: 102 mmol/L (ref 98–111)
Creatinine, Ser: 1.21 mg/dL (ref 0.61–1.24)
GFR, Estimated: 59 mL/min — ABNORMAL LOW (ref >60)
Glucose, Bld: 150 mg/dL — ABNORMAL HIGH (ref 70–99)
Potassium: 4.2 mmol/L (ref 3.5–5.1)
Sodium: 138 mmol/L (ref 135–145)
Total Bilirubin: 0.7 mg/dL (ref 0.3–1.2)
Total Protein: 7 g/dL (ref 6.5–8.1)

## 2022-08-27 LAB — BASIC METABOLIC PANEL
Anion gap: 15 (ref 5–15)
BUN: 25 mg/dL — ABNORMAL HIGH (ref 8–23)
CO2: 20 mmol/L — ABNORMAL LOW (ref 22–32)
Calcium: 8.4 mg/dL — ABNORMAL LOW (ref 8.9–10.3)
Chloride: 102 mmol/L (ref 98–111)
Creatinine, Ser: 1.4 mg/dL — ABNORMAL HIGH (ref 0.61–1.24)
GFR, Estimated: 50 mL/min — ABNORMAL LOW (ref >60)
Glucose, Bld: 132 mg/dL — ABNORMAL HIGH (ref 70–99)
Potassium: 4.2 mmol/L (ref 3.5–5.1)
Sodium: 137 mmol/L (ref 135–145)

## 2022-08-27 LAB — LIPASE, BLOOD: Lipase: 30 U/L (ref 11–51)

## 2022-08-27 LAB — PROCALCITONIN: Procalcitonin: 5.78 ng/mL

## 2022-08-27 LAB — MRSA NEXT GEN BY PCR, NASAL: MRSA by PCR Next Gen: NOT DETECTED

## 2022-08-27 LAB — TROPONIN I (HIGH SENSITIVITY)
Troponin I (High Sensitivity): 8 ng/L (ref <18)
Troponin I (High Sensitivity): 11 ng/L (ref <18)
Troponin I (High Sensitivity): 17 ng/L (ref <18)

## 2022-08-27 LAB — SARS CORONAVIRUS 2 BY RT PCR: SARS Coronavirus 2 by RT PCR: NEGATIVE

## 2022-08-27 LAB — PHOSPHORUS: Phosphorus: 3.7 mg/dL (ref 2.5–4.6)

## 2022-08-27 LAB — STREP PNEUMONIAE URINARY ANTIGEN: Strep Pneumo Urinary Antigen: NEGATIVE

## 2022-08-27 LAB — BRAIN NATRIURETIC PEPTIDE: B Natriuretic Peptide: 259 pg/mL — ABNORMAL HIGH (ref 0.0–100.0)

## 2022-08-27 LAB — MAGNESIUM: Magnesium: 1.9 mg/dL (ref 1.7–2.4)

## 2022-08-27 MED ORDER — SODIUM CHLORIDE 0.9 % IV SOLN
1.0000 g | Freq: Once | INTRAVENOUS | Status: AC
  Administered 2022-08-27: 1 g via INTRAVENOUS
  Filled 2022-08-27: qty 10

## 2022-08-27 MED ORDER — SODIUM CHLORIDE 0.9 % IV SOLN
2.0000 g | INTRAVENOUS | Status: DC
  Administered 2022-08-28 – 2022-08-31 (×4): 2 g via INTRAVENOUS
  Filled 2022-08-27 (×4): qty 20

## 2022-08-27 MED ORDER — PANTOPRAZOLE SODIUM 40 MG PO TBEC
40.0000 mg | DELAYED_RELEASE_TABLET | Freq: Every day | ORAL | Status: DC
  Administered 2022-08-27 – 2022-08-31 (×5): 40 mg via ORAL
  Filled 2022-08-27 (×5): qty 1

## 2022-08-27 MED ORDER — ZOLPIDEM TARTRATE 5 MG PO TABS
10.0000 mg | ORAL_TABLET | Freq: Every evening | ORAL | Status: DC | PRN
  Administered 2022-08-29 – 2022-08-30 (×2): 10 mg via ORAL
  Filled 2022-08-27 (×2): qty 2

## 2022-08-27 MED ORDER — ALBUTEROL SULFATE (2.5 MG/3ML) 0.083% IN NEBU: 3.0000 mL | INHALATION_SOLUTION | Freq: Four times a day (QID) | RESPIRATORY_TRACT | Status: DC | PRN

## 2022-08-27 MED ORDER — FUROSEMIDE 40 MG PO TABS
40.0000 mg | ORAL_TABLET | Freq: Every day | ORAL | Status: DC
  Administered 2022-08-27: 40 mg via ORAL
  Filled 2022-08-27: qty 1

## 2022-08-27 MED ORDER — SODIUM CHLORIDE 0.9 % IV BOLUS
500.0000 mL | Freq: Once | INTRAVENOUS | Status: AC
  Administered 2022-08-27: 500 mL via INTRAVENOUS

## 2022-08-27 MED ORDER — LEVALBUTEROL TARTRATE 45 MCG/ACT IN AERO: 2.0000 | INHALATION_SPRAY | Freq: Four times a day (QID) | RESPIRATORY_TRACT | Status: DC | PRN

## 2022-08-27 MED ORDER — ASPIRIN 81 MG PO CHEW
81.0000 mg | CHEWABLE_TABLET | Freq: Every day | ORAL | Status: DC
  Administered 2022-08-27 – 2022-08-30 (×4): 81 mg via ORAL
  Filled 2022-08-27 (×4): qty 1

## 2022-08-27 MED ORDER — HYDROCODONE BIT-HOMATROP MBR 5-1.5 MG/5ML PO SOLN: 5.0000 mL | Freq: Three times a day (TID) | ORAL | 0 refills | Status: DC | PRN

## 2022-08-27 MED ORDER — FERROUS SULFATE 325 (65 FE) MG PO TABS
325.0000 mg | ORAL_TABLET | Freq: Every day | ORAL | Status: DC
  Administered 2022-08-28 – 2022-08-31 (×4): 325 mg via ORAL
  Filled 2022-08-27 (×4): qty 1

## 2022-08-27 MED ORDER — METOPROLOL SUCCINATE ER 25 MG PO TB24
25.0000 mg | ORAL_TABLET | Freq: Every day | ORAL | Status: DC
  Administered 2022-08-27 – 2022-08-31 (×5): 25 mg via ORAL
  Filled 2022-08-27 (×5): qty 1

## 2022-08-27 MED ORDER — IPRATROPIUM-ALBUTEROL 0.5-2.5 (3) MG/3ML IN SOLN
RESPIRATORY_TRACT | Status: AC
  Filled 2022-08-27: qty 3

## 2022-08-27 MED ORDER — RIVAROXABAN 20 MG PO TABS
20.0000 mg | ORAL_TABLET | Freq: Every day | ORAL | Status: DC
  Administered 2022-08-27 – 2022-08-30 (×4): 20 mg via ORAL
  Filled 2022-08-27 (×4): qty 1

## 2022-08-27 MED ORDER — FERROUS SULFATE 325 (65 FE) MG PO TABS
325.0000 mg | ORAL_TABLET | Freq: Every day | ORAL | Status: DC
  Filled 2022-08-27: qty 1

## 2022-08-27 MED ORDER — AMLODIPINE BESYLATE 5 MG PO TABS: 5.0000 mg | ORAL_TABLET | Freq: Every day | ORAL | Status: DC

## 2022-08-27 MED ORDER — AZITHROMYCIN 500 MG PO TABS
500.0000 mg | ORAL_TABLET | Freq: Every day | ORAL | Status: DC
  Administered 2022-08-28 – 2022-08-31 (×4): 500 mg via ORAL
  Filled 2022-08-27 (×4): qty 1

## 2022-08-27 MED ORDER — LIP MEDEX EX OINT: TOPICAL_OINTMENT | CUTANEOUS | Status: DC | PRN

## 2022-08-27 MED ORDER — GABAPENTIN 300 MG PO CAPS
300.0000 mg | ORAL_CAPSULE | Freq: Two times a day (BID) | ORAL | Status: DC
  Administered 2022-08-27 – 2022-08-31 (×8): 300 mg via ORAL
  Filled 2022-08-27 (×8): qty 1

## 2022-08-27 MED ORDER — ONDANSETRON HCL 4 MG/2ML IJ SOLN
4.0000 mg | Freq: Once | INTRAMUSCULAR | Status: AC
  Administered 2022-08-27: 4 mg via INTRAVENOUS
  Filled 2022-08-27: qty 2

## 2022-08-27 MED ORDER — METHYLPREDNISOLONE SODIUM SUCC 125 MG IJ SOLR
125.0000 mg | Freq: Once | INTRAMUSCULAR | Status: AC
  Administered 2022-08-27: 125 mg via INTRAVENOUS
  Filled 2022-08-27: qty 2

## 2022-08-27 MED ORDER — FLUTICASONE FUROATE-VILANTEROL 100-25 MCG/ACT IN AEPB
1.0000 | INHALATION_SPRAY | Freq: Every day | RESPIRATORY_TRACT | Status: DC
  Filled 2022-08-27: qty 28

## 2022-08-27 MED ORDER — BENZONATATE 100 MG PO CAPS: 200.0000 mg | ORAL_CAPSULE | Freq: Three times a day (TID) | ORAL | Status: DC | PRN

## 2022-08-27 MED ORDER — ACETAMINOPHEN 325 MG PO TABS
325.0000 mg | ORAL_TABLET | Freq: Every evening | ORAL | Status: DC
  Administered 2022-08-27 – 2022-08-30 (×4): 325 mg via ORAL
  Filled 2022-08-27 (×5): qty 1

## 2022-08-27 MED ORDER — HYDRALAZINE HCL 20 MG/ML IJ SOLN: 5.0000 mg | Freq: Four times a day (QID) | INTRAMUSCULAR | Status: DC | PRN

## 2022-08-27 MED ORDER — IRBESARTAN 150 MG PO TABS
75.0000 mg | ORAL_TABLET | Freq: Every day | ORAL | Status: DC
  Filled 2022-08-27 (×2): qty 1

## 2022-08-27 MED ORDER — SODIUM CHLORIDE 0.9 % IV SOLN
500.0000 mg | Freq: Once | INTRAVENOUS | Status: AC
  Administered 2022-08-27: 500 mg via INTRAVENOUS
  Filled 2022-08-27: qty 5

## 2022-08-27 MED ORDER — METOPROLOL TARTRATE 5 MG/5ML IV SOLN
2.5000 mg | INTRAVENOUS | Status: DC | PRN
  Filled 2022-08-27: qty 5

## 2022-08-27 NOTE — Progress Notes (Signed)
BP borderline low, will hold off Amlodipine and Lasix and ARB.

## 2022-08-27 NOTE — Progress Notes (Signed)
Patient transferred to [facility]. RN called son Oswaldo Done with updates and room number.

## 2022-08-27 NOTE — ED Notes (Signed)
ED TO INPATIENT HANDOFF REPORT  ED Nurse Name and Phone #: Rhae Lerner [phone]  S Name/Age/Gender Brendan Hopkins 87 y.o. male Room/Bed: 025C/025C  Code Status   Code Status: Full Code  Home/SNF/Other Home Patient oriented to: self, place, time, and situation Is this baseline? Yes   Triage Complete: Triage complete  Chief Complaint PNA (pneumonia) [J18.9]  Triage Note Pt to the ed from home with a CC of shaking uncontrollably. Pt relays he was sitting talking on the phone when it began. Pt relays he is having a hard time breathing and is feeling nauseous.  Pt relays he is having left sided chest pain that began when he arrived to the ed. Pt denies dizziness loc.    Allergies No Known Allergies  Level of Care/Admitting Diagnosis ED Disposition     ED Disposition  Admit   Condition  --   Comment  Hospital Area: [facility] [[id number]]  Level of Care: Telemetry Medical [104]  May place patient in observation at [facility] or [facility] if equivalent level of care is available:: No  Covid Evaluation: Confirmed COVID Negative  Diagnosis: PNA (pneumonia) [454098]  Admitting Physician: Emeline General [[id number]]  Attending Physician: Emeline General [[id number]]          B Medical/Surgery History Past Medical History:  Diagnosis Date   Acute deep vein thrombosis (DVT) of tibial vein of right lower extremity    after ankle fracture/surgery (2/21)   Arthritis    RIGHT HIP, HANDS, BACK   Cancer    Skin cancer    Cataract    Cervical radiculopathy    severe C3-4 neuroforaminal stenosis, C5-6 left synovial cyst compressing left nerve root.   Colon polyps    2005   COPD (chronic obstructive pulmonary disease)    SMOKED FOR 29 YRS   Diverticulosis    2005   Fatty liver    GERD (gastroesophageal reflux disease)    Hard of hearing    History of kidney stones    Hypertension    Leg fracture, right    Pain    RIGHT HIP AND BACK   Personal history of  colonic polyps-adenoma and sessile serrated polyp 07/15/2010   Pneumonia 03/2019   RBBB    POSS HX AF IN PAST- PAC'S   Shortness of breath    WITH EXERTION ONLY   Stroke    TIA   TIA (transient ischemic attack)    Wears glasses    Past Surgical History:  Procedure Laterality Date   COLONOSCOPY     maybe 3    COLONOSCOPY W/ POLYPECTOMY     CYSTOSCOPY WITH RETROGRADE PYELOGRAM, URETEROSCOPY AND STENT PLACEMENT Left 12/11/2019   Procedure: CYSTOSCOPY WITH LEFT RETROGRADE LEFT  URETEROSCOPY WITH HOLMIUM LASER AND STENT PLACEMENT;  Surgeon: Bjorn Pippin, MD;  Location: [facility];  Service: Urology;  Laterality: Left;   ORIF ANKLE FRACTURE Right 07/11/2019   ORIF ANKLE FRACTURE Right 07/11/2019   Procedure: OPEN REDUCTION INTERNAL FIXATION (ORIF) ANKLE FRACTURE;  Surgeon: Roby Lofts, MD;  Location: [facility] OR;  Service: Orthopedics;  Laterality: Right;   skin cancers removed      TOTAL HIP ARTHROPLASTY Right 01/09/2013   Procedure: RIGHT TOTAL HIP ARTHROPLASTY ANTERIOR APPROACH;  Surgeon: Shelda Pal, MD;  Location: [facility];  Service: Orthopedics;  Laterality: Right;   TOTAL HIP ARTHROPLASTY Left 02/24/2021   Procedure: TOTAL HIP ARTHROPLASTY ANTERIOR APPROACH;  Surgeon: Durene Romans, MD;  Location: [facility];  Service: Orthopedics;  Laterality: Left;     A IV Location/Drains/Wounds Patient Lines/Drains/Airways Status     Active Line/Drains/Airways     Name Placement date Placement time Site Days   Peripheral IV 08/27/22 18 G Right Antecubital 08/27/22  1122  Antecubital  less than 1   Peripheral IV 08/27/22 18 G Left Antecubital 08/27/22  1130  Antecubital  less than 1            Intake/Output Last 24 hours  Intake/Output Summary (Last 24 hours) at 08/27/2022 1446 Last data filed at 08/27/2022 1312 Gross per 24 hour  Intake 850 ml  Output --  Net 850 ml    Labs/Imaging Results for orders placed or performed during the hospital encounter of 08/27/22 (from the past 48 hour(s))   Comprehensive metabolic panel     Status: Abnormal   Collection Time: 08/27/22 11:03 AM  Result Value Ref Range   Sodium 138 135 - 145 mmol/L   Potassium 4.2 3.5 - 5.1 mmol/L   Chloride 102 98 - 111 mmol/L   CO2 20 (L) 22 - 32 mmol/L   Glucose, Bld 150 (H) 70 - 99 mg/dL    Comment: Glucose reference range applies only to samples taken after fasting for at least 8 hours.   BUN 24 (H) 8 - 23 mg/dL   Creatinine, Ser 1.28 0.61 - 1.24 mg/dL   Calcium 8.7 (L) 8.9 - 10.3 mg/dL   Total Protein 7.0 6.5 - 8.1 g/dL   Albumin 3.9 3.5 - 5.0 g/dL   AST 25 15 - 41 U/L   ALT 17 0 - 44 U/L   Alkaline Phosphatase 64 38 - 126 U/L   Total Bilirubin 0.7 0.3 - 1.2 mg/dL   GFR, Estimated 59 (L) >60 mL/min    Comment: (NOTE) Calculated using the CKD-EPI Creatinine Equation (2021)    Anion gap 16 (H) 5 - 15    Comment: Performed at [facility], [address] [place], [place] [address]  Lipase, blood     Status: None   Collection Time: 08/27/22 11:03 AM  Result Value Ref Range   Lipase 30 11 - 51 U/L    Comment: Performed at [facility], [address], [place], [place] [address]  Lactic acid, plasma     Status: None   Collection Time: 08/27/22 11:03 AM  Result Value Ref Range   Lactic Acid, Venous 1.9 0.5 - 1.9 mmol/L    Comment: Performed at [facility], [address], [place], [place] [address]  Troponin I (High Sensitivity)     Status: None   Collection Time: 08/27/22 11:03 AM  Result Value Ref Range   Troponin I (High Sensitivity) 8 <18 ng/L    Comment: (NOTE) Elevated high sensitivity troponin I (hsTnI) values and significant  changes across serial measurements may suggest ACS but many other  chronic and acute conditions are known to elevate hsTnI results.  Refer to the "Links" section for chest pain algorithms and additional  guidance. Performed at [facility], [address] [place], [place] [address]   CBC with Differential     Status: Abnormal    Collection Time: 08/27/22 11:03 AM  Result Value Ref Range   WBC 14.8 (H) 4.0 - 10.5 K/uL   RBC 4.28 4.22 - 5.81 MIL/uL   Hemoglobin 11.7 (L) 13.0 - 17.0 g/dL   HCT 94.7 (L) 65.4 - 65.0 %   MCV 87.9 80.0 - 100.0  fL   MCH 27.3 26.0 - 34.0 pg   MCHC 31.1 30.0 - 36.0 g/dL   RDW 62.922.2 (H) 52.811.5 - 41.315.5 %   Platelets 231 150 - 400 K/uL   nRBC 0.0 0.0 - 0.2 %   Neutrophils Relative % 88 %   Neutro Abs 13.0 (H) 1.7 - 7.7 K/uL   Lymphocytes Relative 5 %   Lymphs Abs 0.7 0.7 - 4.0 K/uL   Monocytes Relative 7 %   Monocytes Absolute 1.0 0.1 - 1.0 K/uL   Eosinophils Relative 0 %   Eosinophils Absolute 0.1 0.0 - 0.5 K/uL   Basophils Relative 0 %   Basophils Absolute 0.1 0.0 - 0.1 K/uL   WBC Morphology MORPHOLOGY UNREMARKABLE    RBC Morphology See Note    Smear Review Normal platelet morphology    Immature Granulocytes 0 %   Abs Immature Granulocytes 0.06 0.00 - 0.07 K/uL   Ovalocytes PRESENT     Comment: Performed at [facility], [address] [place], [place] [address]  SARS Coronavirus 2 by RT PCR (hospital order, performed in [facility] hospital lab) *cepheid single result test*     Status: None   Collection Time: 08/27/22 11:04 AM   Specimen: Nasal Swab  Result Value Ref Range   SARS Coronavirus 2 by RT PCR NEGATIVE NEGATIVE    Comment: Performed at [facility], [address] [place], [place] [address]  Lactic acid, plasma     Status: None   Collection Time: 08/27/22  1:03 PM  Result Value Ref Range   Lactic Acid, Venous 1.3 0.5 - 1.9 mmol/L    Comment: Performed at [facility], [address] [place], [place] [address]  Troponin I (High Sensitivity)     Status: None   Collection Time: 08/27/22  1:03 PM  Result Value Ref Range   Troponin I (High Sensitivity) 11 <18 ng/L    Comment: (NOTE) Elevated high sensitivity troponin I (hsTnI) values and significant  changes across serial measurements may suggest ACS but many other  chronic and acute conditions are  known to elevate hsTnI results.  Refer to the "Links" section for chest pain algorithms and additional  guidance. Performed at [facility], [address] [place], [place] [address]    DG Chest Portable 1 View  Result Date: 08/27/2022 CLINICAL DATA:  Chest pain. EXAM: PORTABLE CHEST 1 VIEW COMPARISON:  Chest radiographs 07/03/2022, 07/11/2021, 04/29/2020; CT chest 07/03/2022 FINDINGS: There are moderately decreased lung volumes. Cardiac silhouette is grossly unchanged and again likely mildly to moderately enlarged. Minimal calcifications within the aortic arch. Moderately decreased lung volumes. Chronic mild elevation of the left hemidiaphragm. There is interstitial thickening and heterogeneous opacification within the inferior left lung. This density and hypoinflation has waxed and waned over multiple prior radiographs, however appears increased from baseline on the current frontal view. No pneumothorax. No acute skeletal abnormality. IMPRESSION: Moderately decreased lung volumes. There is interstitial thickening and heterogeneous opacification within the inferior left lung. This is suspicious for pleural effusion and pneumonia given at appears increased from prior. Electronically Signed   By: Neita Garnetonald  Viola M.D.   On: 08/27/2022 11:22    Pending Labs Unresulted Labs (From admission, onward)     Start     Ordered   08/27/22 1430  Mycoplasma pneumoniae antibody, IgM  Once,   R        08/27/22 1430   08/27/22 1352  Legionella Pneumophila Serogp 1  Ur Ag  Once,   URGENT        08/27/22 1351   08/27/22 1104  Urine Culture  Once,   URGENT       Question:  Indication  Answer:  Sepsis   08/27/22 1103   08/27/22 1103  Culture, blood (routine x 2)  BLOOD CULTURE X 2,   R      08/27/22 1103   08/27/22 1103  Urinalysis, w/ Reflex to Culture (Infection Suspected) -Urine, Clean Catch  Once,   URGENT       Question:  Specimen Source  Answer:  Urine, Clean Catch   08/27/22 1103             Vitals/Pain Today's Vitals   08/27/22 1055 08/27/22 1145 08/27/22 1245 08/27/22 1415  BP: 138/83 (!) 111/59 (!) 148/84 122/70  Pulse: 96 87 90 90  Resp: (!) 35 (!) 27 (!) 23 (!) 29  Temp: 99.3 F (37.4 C)     TempSrc: Oral     SpO2: 90% 96% 96% 99%  Weight:      Height:      PainSc:        Isolation Precautions Airborne and Contact precautions  Medications Medications  acetaminophen (TYLENOL) tablet 325 mg (has no administration in time range)  aspirin chewable tablet 81 mg (has no administration in time range)  amLODipine (NORVASC) tablet 5 mg (has no administration in time range)  furosemide (LASIX) tablet 40 mg (has no administration in time range)  metoprolol succinate (TOPROL-XL) 24 hr tablet 25 mg (has no administration in time range)  irbesartan (AVAPRO) tablet 75 mg (has no administration in time range)  zolpidem (AMBIEN) tablet 10 mg (has no administration in time range)  pantoprazole (PROTONIX) EC tablet 40 mg (has no administration in time range)  ferrous sulfate tablet 325 mg (has no administration in time range)  rivaroxaban (XARELTO) tablet 20 mg (has no administration in time range)  gabapentin (NEURONTIN) capsule 300 mg (has no administration in time range)  albuterol (VENTOLIN HFA) 108 (90 Base) MCG/ACT inhaler 1 puff (has no administration in time range)  fluticasone furoate-vilanterol (BREO ELLIPTA) 100-25 MCG/ACT 1 puff (has no administration in time range)  benzonatate (TESSALON) capsule 200 mg (has no administration in time range)  sodium chloride 0.9 % bolus 500 mL (0 mLs Intravenous Stopped 08/27/22 1204)  ondansetron (ZOFRAN) injection 4 mg (4 mg Intravenous Given 08/27/22 1205)  cefTRIAXone (ROCEPHIN) 1 g in sodium chloride 0.9 % 100 mL IVPB (0 g Intravenous Stopped 08/27/22 1254)  azithromycin (ZITHROMAX) 500 mg in sodium chloride 0.9 % 250 mL IVPB (0 mg Intravenous Stopped 08/27/22 1312)    Mobility walks with person assist     Focused  Assessments Pt c/o increased SOB at rest. Pt currently on 2L [place].   R Recommendations: See Admitting Provider Note  Report given to:   Additional Notes:

## 2022-08-27 NOTE — Progress Notes (Signed)
MEWS Progress Note  Patient Details Name: Brendan Hopkins MRN: [id number] DOB: 10/19/1935 Today's Date: 08/27/2022   MEWS Flowsheet Documentation:  Assess: MEWS Score Temp: 98.7 F (37.1 C) BP: 105/69 MAP (mmHg): 81 Pulse Rate: (!) 129 ECG Heart Rate: (!) 116 Resp: (!) 27 SpO2: 93 % O2 Device: Nasal Cannula O2 Flow Rate (L/min): 6 L/min FiO2 (%): 100 % Assess: MEWS Score MEWS Temp: 0 MEWS Systolic: 0 MEWS Pulse: 2 MEWS RR: 2 MEWS LOC: 0 MEWS Score: 4 MEWS Score Color: Red Assess: SIRS CRITERIA SIRS Temperature : 0 SIRS Respirations : 1 SIRS Pulse: 1 SIRS WBC: 1 SIRS Score Sum : 3 SIRS Temperature : 0 SIRS Pulse: 1 SIRS Respirations : 1 SIRS WBC: 1 SIRS Score Sum : 3 Assess: if the MEWS score is Yellow or Red Were vital signs taken at a resting state?: Yes Focused Assessment: Change from prior assessment (see assessment flowsheet) Does the patient meet 2 or more of the SIRS criteria?: Yes Does the patient have a confirmed or suspected source of infection?: Yes Provider and Rapid Response Notified?: Yes MEWS guidelines implemented : Yes, red Treat MEWS Interventions: Considered administering scheduled or prn medications/treatments as ordered Take Vital Signs Increase Vital Sign Frequency : Red: Q1hr x2, continue Q4hrs until patient remains green for 12hrs Escalate MEWS: Escalate: Red: Discuss with charge nurse and notify provider. Consider notifying RRT. If remains red for 2 hours consider need for higher level of care Notify: Charge Nurse/RN Name of Charge Nurse/RN Notified: Catrina, RN Provider Notification Provider Name/Title: Chipper Herb, MD Date Provider Notified: 08/27/22 Time Provider Notified: 1842 Method of Notification: Page Notification Reason: Change in status Provider response: No new orders Notify: Rapid Response Name of Rapid Response RN Notified: rapid response RN Date Rapid Response Notified: 08/27/22 Time Rapid Response Notified:  1845    Patient's HR 115-130, RR 26, increased work of breathing but maintaining O2 sats 95% on 6 L [place]. Rapid response nurse notified, she will come to bedside. MD paged.     Malachi Carl 08/27/2022, 6:45 PM

## 2022-08-27 NOTE — Significant Event (Signed)
Rapid Response Event Note   Reason for Call :  Tachycardia-120s, increased  WOB and oxygen demand  Pt HR was 90s in the ED, SpO2-96% on RA.   Pt HR now 120s, SpO2-93% on 6L [place].  Initial Focused Assessment:  Pt sitting up in bed with eyes open, alert and oriented. His breathing is tachypneic and labored. He is c/o of SOB and chest congestion/pain. He is unable to speak in complete sentences d/t is SOB.  He has a congested cough. Lung sounds clear in upper lobes, decreased in the bases(L>R). Skin warm and dry. BLE edema present.   T-98.7, HR-129, BP-105/69, RR-27, SpO2-93% on 6L [place].   Interventions:  EKG-Afib RVR BMP/Mg/P/BNP/Trop IS Lopressor 2.5mg  IV(held  because HR better) Solumedrol 125mg  IV x 1 Tx to PCU for closer monitoring.  Plan of Care:  Tx to 2C13. Await lab results. Continue to monitor pt closely. Call RRT if further assistance needed.   Event Summary:   MD Notified: Dr. Chipper Herb notified PTA RRT, Virgel Manifold, NP notified on my arrival.  Call Time:1839 Arrival Time:1850 End Time:2011  Terrilyn Saver, RN

## 2022-08-27 NOTE — Telephone Encounter (Signed)
Prescription Request  08/27/2022  LOV: 05/04/2022  What is the name of the medication or equipment?   HYDROcodone-homatropine (HYCODAN) 5-1.5 MG/5ML syrup [[id number]]  DISCONTINUED  **PATIENT STATED HE NEEDS IT CALLED IN TODAY; DOESN'T HAVE ENOUGH TO LAST THROUGH THE WEEKEND** **patient requesting call back regarding his iron**  Have you contacted your pharmacy to request a refill? Yes   Which pharmacy would you like this sent to?  [id number] - [place], [place] - [address] AT [facility] [address] [place] [address] Phone: [phone] Fax: [phone]    Patient notified that their request is being sent to the clinical staff for review and that they should receive a response within 2 business days.   Please advise patient when refill sent in at [phone].

## 2022-08-27 NOTE — H&P (Signed)
History and Physical    Brendan Hopkins ZOX:[id number] DOB: 08/03/1935 DOA: 08/27/2022  PCP: Donita Brooks, MD (Confirm with patient/family/NH records and if not entered, this has to be entered at [facility] point of entry) Patient coming from: Home  I have personally briefly reviewed patient's old medical records in [facility]  Chief Complaint: Cough, SOB  HPI: Brendan Hopkins is a 87 y.o. male with medical history significant of PAF on Xarelto, TIA, DVT, COPD, remote history of cigarette smoker, presented with worsening of cough shortness of breath and chills.  Patient had 1 episode of CAP January this year treated with antibiotics.  After that however patient continued to have dry cough, night> day.  He has been followed by his primary physician who prosecuting with cough syrup containing codeine which achieved some partial relief of his cough symptoms.  Last 2 days patient has been having increasing dry cough and started to develop shortness of breath and gradually getting worse no chest pains.  Today he also had episode of chills and subjective fever at home and decided to come to ED.  Patient has history of GERD, on PPI, and he reported his GERD symptoms is poorly controlled.  Denies any choking or coughing after eating or drinking, no history of aspiration.  ED Course: Patient was found to be hypoxic stabilized on 2 L oxygen.  Chest x-ray showed suspicious left lower lobe pneumonia.  WBC 14.0, hemoglobin 11.7, creatinine 1.2.  Patient was given antibiotics ceftriaxone and azithromycin in the ED.  Review of Systems: As per HPI otherwise 14 point review of systems negative.    Past Medical History:  Diagnosis Date   Acute deep vein thrombosis (DVT) of tibial vein of right lower extremity    after ankle fracture/surgery (2/21)   Arthritis    RIGHT HIP, HANDS, BACK   Cancer    Skin cancer    Cataract    Cervical radiculopathy    severe C3-4 neuroforaminal stenosis, C5-6 left  synovial cyst compressing left nerve root.   Colon polyps    2005   COPD (chronic obstructive pulmonary disease)    SMOKED FOR 75 YRS   Diverticulosis    2005   Fatty liver    GERD (gastroesophageal reflux disease)    Hard of hearing    History of kidney stones    Hypertension    Leg fracture, right    Pain    RIGHT HIP AND BACK   Personal history of colonic polyps-adenoma and sessile serrated polyp 07/15/2010   Pneumonia 03/2019   RBBB    POSS HX AF IN PAST- PAC'S   Shortness of breath    WITH EXERTION ONLY   Stroke    TIA   TIA (transient ischemic attack)    Wears glasses     Past Surgical History:  Procedure Laterality Date   COLONOSCOPY     maybe 3    COLONOSCOPY W/ POLYPECTOMY     CYSTOSCOPY WITH RETROGRADE PYELOGRAM, URETEROSCOPY AND STENT PLACEMENT Left 12/11/2019   Procedure: CYSTOSCOPY WITH LEFT RETROGRADE LEFT  URETEROSCOPY WITH HOLMIUM LASER AND STENT PLACEMENT;  Surgeon: Bjorn Pippin, MD;  Location: [facility];  Service: Urology;  Laterality: Left;   ORIF ANKLE FRACTURE Right 07/11/2019   ORIF ANKLE FRACTURE Right 07/11/2019   Procedure: OPEN REDUCTION INTERNAL FIXATION (ORIF) ANKLE FRACTURE;  Surgeon: Roby Lofts, MD;  Location: [facility] OR;  Service: Orthopedics;  Laterality: Right;   skin cancers removed  TOTAL HIP ARTHROPLASTY Right 01/09/2013   Procedure: RIGHT TOTAL HIP ARTHROPLASTY ANTERIOR APPROACH;  Surgeon: Shelda Pal, MD;  Location: [facility];  Service: Orthopedics;  Laterality: Right;   TOTAL HIP ARTHROPLASTY Left 02/24/2021   Procedure: TOTAL HIP ARTHROPLASTY ANTERIOR APPROACH;  Surgeon: Durene Romans, MD;  Location: [facility];  Service: Orthopedics;  Laterality: Left;     reports that he quit smoking about 15 years ago. His smoking use included cigarettes. He has never used smokeless tobacco. He reports that he does not drink alcohol and does not use drugs.  No Known Allergies  Family History  Problem Relation Age of Onset   Cancer Mother         died more of old age at age 27   Cancer Father    Colon cancer Neg Hx    Esophageal cancer Neg Hx    Prostate cancer Neg Hx    Rectal cancer Neg Hx    Stomach cancer Neg Hx      Prior to Admission medications   Medication Sig Start Date End Date Taking? Authorizing Provider  acetaminophen (TYLENOL) 325 MG tablet Take 325 mg by mouth every evening.   Yes [provider]  albuterol (VENTOLIN HFA) 108 (90 Base) MCG/ACT inhaler Inhale 1 to 2  puffs by mouth every 6 hours as needed for shortness of breath Patient taking differently: Inhale 1 puff into the lungs every 6 (six) hours as needed for shortness of breath. 05/07/22  Yes Donita Brooks, MD  amLODipine (NORVASC) 5 MG tablet Take 1 tablet by mouth at bedtime Patient taking differently: Take 5 mg by mouth daily. 07/05/22  Yes Donita Brooks, MD  aspirin 81 MG chewable tablet Chew 81 mg by mouth at bedtime.   Yes [provider]  Fluticasone-Umeclidin-Vilant (TRELEGY ELLIPTA) 100-62.5-25 MCG/ACT AEPB Inhale 1 puff into the lungs daily. 09/16/21  Yes Donita Brooks, MD  furosemide (LASIX) 40 MG tablet Take 1 tablet by mouth once daily 07/28/22  Yes Pickard, Priscille Heidelberg, MD  gabapentin (NEURONTIN) 300 MG capsule TAKE 1 CAPSULE BY MOUTH THREE TIMES DAILY AS NEEDED FOR  SCIATICA Patient taking differently: Take 300 mg by mouth 2 (two) times daily. 06/24/22  Yes Donita Brooks, MD  HYDROcodone bit-homatropine (HYCODAN) 5-1.5 MG/5ML syrup Take 5 mLs by mouth every 8 (eight) hours as needed for cough. Patient not taking: Reported on 08/27/2022 08/27/22   Donita Brooks, MD  Iron, Ferrous Sulfate, 325 (65 Fe) MG TABS Take 325 mg by mouth daily. 08/05/22  Yes Donita Brooks, MD  metoprolol succinate (TOPROL-XL) 25 MG 24 hr tablet Take 1 tablet (25 mg total) by mouth daily. 01/15/22  Yes Donita Brooks, MD  Multiple Vitamin (MULTIVITAMIN WITH MINERALS) TABS Take 1 tablet by mouth daily.   Yes [provider]  Na  Sulfate-K Sulfate-Mg Sulf 17.5-3.13-1.6 GM/177ML SOLN Take by mouth. 08/25/22  Yes [provider]  Omega-3 Fatty Acids (FISH OIL) 1200 MG CAPS Take 1,200 mg by mouth every evening.   Yes [provider]  pantoprazole (PROTONIX) 40 MG tablet Take 1 tablet by mouth every day 06/02/22  Yes Donita Brooks, MD  rivaroxaban (XARELTO) 20 MG TABS tablet Take 1 tablet (20 mg total) by mouth daily with supper. Patient taking differently: Take 20 mg by mouth daily. 02/01/22  Yes Monge, Petra Kuba, NP  valsartan (DIOVAN) 80 MG tablet Take 1 tablet (80 mg total) by mouth daily. 07/13/22  Yes Lynnea Ferrier  T, MD  zolpidem (AMBIEN) 10 MG tablet Take 1 tablet by mouth at bedtime as needed for sleep 06/04/22  Yes Donita BrooksPickard, Warren T, MD  hydrocortisone 2.5 % cream Apply topically 2 (two) times daily. Patient not taking: Reported on 08/27/2022 02/18/22   Donita BrooksPickard, Warren T, MD  meclizine (ANTIVERT) 25 MG tablet Take 1 tablet by mouth 3 times a day as needed Patient not taking: Reported on 08/27/2022 08/19/21   Donita BrooksPickard, Warren T, MD    Physical Exam: Vitals:   08/27/22 1055 08/27/22 1145 08/27/22 1245 08/27/22 1415  BP: 138/83 (!) 111/59 (!) 148/84 122/70  Pulse: 96 87 90 90  Resp: (!) 35 (!) 27 (!) 23 (!) 29  Temp: 99.3 F (37.4 C)     TempSrc: Oral     SpO2: 90% 96% 96% 99%  Weight:      Height:        Constitutional: NAD, calm, comfortable Vitals:   08/27/22 1055 08/27/22 1145 08/27/22 1245 08/27/22 1415  BP: 138/83 (!) 111/59 (!) 148/84 122/70  Pulse: 96 87 90 90  Resp: (!) 35 (!) 27 (!) 23 (!) 29  Temp: 99.3 F (37.4 C)     TempSrc: Oral     SpO2: 90% 96% 96% 99%  Weight:      Height:       Eyes: PERRL, lids and conjunctivae normal ENMT: Mucous membranes are moist. Posterior pharynx clear of any exudate or lesions.Normal dentition.  Neck: normal, supple, no masses, no thyromegaly Respiratory: Diminished breathing sound on left lower field, bronchitis on left lower field, increasing  breathing effort. No accessory muscle use.  Cardiovascular: Regular rate and rhythm, no murmurs / rubs / gallops. No extremity edema. 2+ pedal pulses. No carotid bruits.  Abdomen: no tenderness, no masses palpated. No hepatosplenomegaly. Bowel sounds positive.  Musculoskeletal: no clubbing / cyanosis. No joint deformity upper and lower extremities. Good ROM, no contractures. Normal muscle tone.  Skin: no rashes, lesions, ulcers. No induration Neurologic: CN 2-12 grossly intact. Sensation intact, DTR normal. Strength 5/5 in all 4.  Psychiatric: Normal judgment and insight. Alert and oriented x 3. Normal mood.     Labs on Admission: I have personally reviewed following labs and imaging studies  CBC: Recent Labs  Lab 08/25/22 1033 08/27/22 1103  WBC 8.5 14.8*  NEUTROABS 5.5 13.0*  HGB 12.0* 11.7*  HCT 37.8* 37.6*  MCV 85.1 87.9  PLT 263.0 231   Basic Metabolic Panel: Recent Labs  Lab 08/25/22 1033 08/27/22 1103  NA 137 138  K 4.2 4.2  CL 103 102  CO2 25 20*  GLUCOSE 113* 150*  BUN 21 24*  CREATININE 1.15 1.21  CALCIUM 9.2 8.7*   GFR: Estimated Creatinine Clearance: 60.6 mL/min (by C-G formula based on SCr of 1.21 mg/dL). Liver Function Tests: Recent Labs  Lab 08/25/22 1033 08/27/22 1103  AST 18 25  ALT 12 17  ALKPHOS 65 64  BILITOT 0.7 0.7  PROT 7.3 7.0  ALBUMIN 4.5 3.9   Recent Labs  Lab 08/27/22 1103  LIPASE 30   No results for input(s): "AMMONIA" in the last 168 hours. Coagulation Profile: No results for input(s): "INR", "PROTIME" in the last 168 hours. Cardiac Enzymes: No results for input(s): "CKTOTAL", "CKMB", "CKMBINDEX", "TROPONINI" in the last 168 hours. BNP (last 3 results) No results for input(s): "PROBNP" in the last 8760 hours. HbA1C: No results for input(s): "HGBA1C" in the last 72 hours. CBG: No results for input(s): "GLUCAP" in the last 168  hours. Lipid Profile: No results for input(s): "CHOL", "HDL", "LDLCALC", "TRIG", "CHOLHDL",  "LDLDIRECT" in the last 72 hours. Thyroid Function Tests: No results for input(s): "TSH", "T4TOTAL", "FREET4", "T3FREE", "THYROIDAB" in the last 72 hours. Anemia Panel: Recent Labs    08/25/22 1033  FERRITIN 23.6  TIBC 394.8  IRON 356*   Urine analysis:    Component Value Date/Time   COLORURINE YELLOW 07/03/2022 0736   APPEARANCEUR CLEAR 07/03/2022 0736   LABSPEC 1.012 07/03/2022 0736   PHURINE 6.0 07/03/2022 0736   GLUCOSEU NEGATIVE 07/03/2022 0736   HGBUR NEGATIVE 07/03/2022 0736   BILIRUBINUR NEGATIVE 07/03/2022 0736   KETONESUR NEGATIVE 07/03/2022 0736   PROTEINUR NEGATIVE 07/03/2022 0736   UROBILINOGEN 0.2 01/01/2013 1028   NITRITE NEGATIVE 07/03/2022 0736   LEUKOCYTESUR NEGATIVE 07/03/2022 0736    Radiological Exams on Admission: DG Chest Portable 1 View  Result Date: 08/27/2022 CLINICAL DATA:  Chest pain. EXAM: PORTABLE CHEST 1 VIEW COMPARISON:  Chest radiographs 07/03/2022, 07/11/2021, 04/29/2020; CT chest 07/03/2022 FINDINGS: There are moderately decreased lung volumes. Cardiac silhouette is grossly unchanged and again likely mildly to moderately enlarged. Minimal calcifications within the aortic arch. Moderately decreased lung volumes. Chronic mild elevation of the left hemidiaphragm. There is interstitial thickening and heterogeneous opacification within the inferior left lung. This density and hypoinflation has waxed and waned over multiple prior radiographs, however appears increased from baseline on the current frontal view. No pneumothorax. No acute skeletal abnormality. IMPRESSION: Moderately decreased lung volumes. There is interstitial thickening and heterogeneous opacification within the inferior left lung. This is suspicious for pleural effusion and pneumonia given at appears increased from prior. Electronically Signed   By: Neita Garnetonald  Viola M.D.   On: 08/27/2022 11:22    EKG: Independently reviewed.  Sinus rhythm, chronic RBBB, no acute ST  changes.  Assessment/Plan Principal Problem:   PNA (pneumonia) Active Problems:   COPD (chronic obstructive pulmonary disease)   Obese   CAP (community acquired pneumonia) due to Pneumococcus   Cough  (please populate well all problems here in Problem List. (For example, if patient is on BP meds at home and you resume or decide to hold them, it is a problem that needs to be her. Same for CAD, COPD, HLD and so on)   Acute hypoxic respiratory failure -Suspect CAP, as patient has worsening of cough with left lower lobe infiltrates as well as crackles diminished breathing sound on left lower field implying pneumonia. -Continue to cover with CAP of ceftriaxone and azithromycin -CT chest without contrast for further characterize as patient had a similar CAP process on LLL in January this year.  And consider pulmonary consultation. -Supportive care including breathing treatment, incentive spirometry. -Atypical pneumonia study -Other Ddx, aspiration less likely   COPD -, No symptoms signs of acute exacerbation -Continue current ICS and LABA -As needed DuoNebs  PAF -In sinus rhythm, continue Xarelto  HTN -Stable, continue Amlodipine    DVT prophylaxis: Xarelto Code Status: Full code Family Communication: Son at bedside Disposition Plan: Expect less than 2 midnight hospital stay Consults called: None Admission status: Tele obs   Emeline GeneralPing T Luay Balding MD [facility] Pager [phone]  08/27/2022, 2:44 PM

## 2022-08-27 NOTE — ED Notes (Signed)
Patient transported to CT 
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
## 2022-08-27 NOTE — Progress Notes (Signed)
CT chest reviewed. Multifocal PNA on LUL, LLL and RUL, concerning for aspiration etiology. Patient denies any cough or choking after eating or drinking but does complains about "sth stuck in my chest sometime" Will consult speech and may consider barium swallow.

## 2022-08-27 NOTE — Progress Notes (Signed)
MEWS Progress Note  Patient Details Name: Brendan Hopkins MRN: [id number] DOB: [birth year] Today's Date: 08/27/2022   MEWS Flowsheet Documentation:  Assess: MEWS Score Temp: 99.9 F (37.7 C) BP: 104/75 MAP (mmHg): 85 Pulse Rate: 90 ECG Heart Rate: (!) 119 Resp: (!) 24 SpO2: 90 % O2 Device: Room Air Assess: MEWS Score MEWS Temp: 0 MEWS Systolic: 0 MEWS Pulse: 2 MEWS RR: 1 MEWS LOC: 0 MEWS Score: 3 MEWS Score Color: Yellow Assess: SIRS CRITERIA SIRS Temperature : 0 SIRS Respirations : 1 SIRS Pulse: 1 SIRS WBC: 1 SIRS Score Sum : 3 SIRS Temperature : 0 SIRS Pulse: 1 SIRS Respirations : 1 SIRS WBC: 1 SIRS Score Sum : 3 Assess: if the MEWS score is Yellow or Red Were vital signs taken at a resting state?: Yes Focused Assessment: No change from prior assessment Does the patient meet 2 or more of the SIRS criteria?: Yes Does the patient have a confirmed or suspected source of infection?: Yes Provider and Rapid Response Notified?: No MEWS guidelines implemented : Yes, yellow Treat MEWS Interventions: Considered administering scheduled or prn medications/treatments as ordered Take Vital Signs Increase Vital Sign Frequency : Yellow: Q2hr x1, continue Q4hrs until patient remains green for 12hrs Escalate MEWS: Escalate: Yellow: Discuss with charge nurse and consider notifying provider and/or RRT Notify: Charge Nurse/RN Name of Charge Nurse/RN Notified: Catrina, RN Provider Notification Provider Name/Title: Chipper Herb, MD Date Provider Notified: 08/27/22 Time Provider Notified: [id number] Method of Notification: Page Notification Reason: Other (Comment) (MEWS) Provider response: No new orders      Brendan Hopkins 08/27/2022, 4:57 PM

## 2022-08-27 NOTE — Progress Notes (Signed)
    Patient Name: Brendan Hopkins           DOB: 19-Feb-1936  MRN: [id number]      Admission Date: 08/27/2022  Attending Provider: Emeline General, MD  Primary Diagnosis: PNA (pneumonia)   Level of care: Progressive    CROSS COVER NOTE   Date of Service   08/27/2022   Brendan Hopkins, 87 y.o. male, was admitted on 08/27/2022 for PNA (pneumonia).    HPI/Events of Note   Notified by rapid response RN of patient with tachycardia (HR 120-130s), and increase WOB, tachypnea, labored breathing, as well as increased oxygen demand.  Patient went from requiring 2 L --> 6 L of oxygen since admission.   A-fib RVR Patient was previously in NSR, but 12 -lead EKG now confirms A-fib RVR.  BP stable, 112/67.  IV Lopressor 2.5 mg dose ordered for rate control.  Labs ordered including BMP, mag, phosphate, BNP, troponin.  Dyspnea Patient has a recently received neb treatment, antibiotics for aspiration pneumonia.  He also has history of COPD.  RN has been encouraged to use Xopenex neb treatment if needed.  Will also add IV Solu-Medrol.   Patient to be transferred to PCU for higher level of care and monitoring.   Interventions/ Plan   EKG IV Lopressor Labs PCU transfer        Anthoney Harada, DNP, [facility] [place]

## 2022-08-27 NOTE — ED Triage Notes (Signed)
Pt to the ed from home with a CC of shaking uncontrollably. Pt relays he was sitting talking on the phone when it began. Pt relays he is having a hard time breathing and is feeling nauseous.  Pt relays he is having left sided chest pain that began when he arrived to the ed. Pt denies dizziness loc.

## 2022-08-28 ENCOUNTER — Encounter (HOSPITAL_COMMUNITY): Payer: Self-pay | Admitting: Family Medicine

## 2022-08-28 DIAGNOSIS — N1831 Chronic kidney disease, stage 3a: Secondary | ICD-10-CM | POA: Insufficient documentation

## 2022-08-28 DIAGNOSIS — I129 Hypertensive chronic kidney disease with stage 1 through stage 4 chronic kidney disease, or unspecified chronic kidney disease: Secondary | ICD-10-CM | POA: Diagnosis present

## 2022-08-28 DIAGNOSIS — I480 Paroxysmal atrial fibrillation: Secondary | ICD-10-CM | POA: Diagnosis present

## 2022-08-28 DIAGNOSIS — E669 Obesity, unspecified: Secondary | ICD-10-CM | POA: Diagnosis present

## 2022-08-28 DIAGNOSIS — Z87891 Personal history of nicotine dependence: Secondary | ICD-10-CM | POA: Diagnosis not present

## 2022-08-28 DIAGNOSIS — M1611 Unilateral primary osteoarthritis, right hip: Secondary | ICD-10-CM | POA: Diagnosis present

## 2022-08-28 DIAGNOSIS — M19041 Primary osteoarthritis, right hand: Secondary | ICD-10-CM | POA: Diagnosis present

## 2022-08-28 DIAGNOSIS — Z6833 Body mass index (BMI) 33.0-33.9, adult: Secondary | ICD-10-CM | POA: Diagnosis not present

## 2022-08-28 DIAGNOSIS — Z8701 Personal history of pneumonia (recurrent): Secondary | ICD-10-CM | POA: Diagnosis not present

## 2022-08-28 DIAGNOSIS — Z8673 Personal history of transient ischemic attack (TIA), and cerebral infarction without residual deficits: Secondary | ICD-10-CM | POA: Diagnosis not present

## 2022-08-28 DIAGNOSIS — Z86718 Personal history of other venous thrombosis and embolism: Secondary | ICD-10-CM | POA: Diagnosis not present

## 2022-08-28 DIAGNOSIS — R053 Chronic cough: Secondary | ICD-10-CM | POA: Diagnosis present

## 2022-08-28 DIAGNOSIS — J439 Emphysema, unspecified: Secondary | ICD-10-CM | POA: Diagnosis present

## 2022-08-28 DIAGNOSIS — J13 Pneumonia due to Streptococcus pneumoniae: Secondary | ICD-10-CM | POA: Diagnosis present

## 2022-08-28 DIAGNOSIS — J9601 Acute respiratory failure with hypoxia: Secondary | ICD-10-CM | POA: Diagnosis present

## 2022-08-28 DIAGNOSIS — J441 Chronic obstructive pulmonary disease with (acute) exacerbation: Secondary | ICD-10-CM | POA: Diagnosis present

## 2022-08-28 DIAGNOSIS — R0602 Shortness of breath: Secondary | ICD-10-CM | POA: Diagnosis present

## 2022-08-28 DIAGNOSIS — Z86010 Personal history of colonic polyps: Secondary | ICD-10-CM | POA: Diagnosis not present

## 2022-08-28 DIAGNOSIS — Z1152 Encounter for screening for COVID-19: Secondary | ICD-10-CM | POA: Diagnosis not present

## 2022-08-28 DIAGNOSIS — K760 Fatty (change of) liver, not elsewhere classified: Secondary | ICD-10-CM | POA: Diagnosis present

## 2022-08-28 DIAGNOSIS — J449 Chronic obstructive pulmonary disease, unspecified: Secondary | ICD-10-CM | POA: Diagnosis not present

## 2022-08-28 DIAGNOSIS — M19042 Primary osteoarthritis, left hand: Secondary | ICD-10-CM | POA: Diagnosis present

## 2022-08-28 DIAGNOSIS — Z85828 Personal history of other malignant neoplasm of skin: Secondary | ICD-10-CM | POA: Diagnosis not present

## 2022-08-28 DIAGNOSIS — K219 Gastro-esophageal reflux disease without esophagitis: Secondary | ICD-10-CM | POA: Diagnosis present

## 2022-08-28 DIAGNOSIS — I679 Cerebrovascular disease, unspecified: Secondary | ICD-10-CM | POA: Diagnosis present

## 2022-08-28 DIAGNOSIS — M479 Spondylosis, unspecified: Secondary | ICD-10-CM | POA: Diagnosis present

## 2022-08-28 DIAGNOSIS — J189 Pneumonia, unspecified organism: Secondary | ICD-10-CM | POA: Diagnosis not present

## 2022-08-28 LAB — BASIC METABOLIC PANEL
Anion gap: 17 — ABNORMAL HIGH (ref 5–15)
BUN: 29 mg/dL — ABNORMAL HIGH (ref 8–23)
CO2: 17 mmol/L — ABNORMAL LOW (ref 22–32)
Calcium: 8.6 mg/dL — ABNORMAL LOW (ref 8.9–10.3)
Chloride: 100 mmol/L (ref 98–111)
Creatinine, Ser: 1.35 mg/dL — ABNORMAL HIGH (ref 0.61–1.24)
GFR, Estimated: 52 mL/min — ABNORMAL LOW (ref >60)
Glucose, Bld: 178 mg/dL — ABNORMAL HIGH (ref 70–99)
Potassium: 4.8 mmol/L (ref 3.5–5.1)
Sodium: 134 mmol/L — ABNORMAL LOW (ref 135–145)

## 2022-08-28 LAB — EXPECTORATED SPUTUM ASSESSMENT W GRAM STAIN, RFLX TO RESP C

## 2022-08-28 LAB — CBC
HCT: 35.9 % — ABNORMAL LOW (ref 39.0–52.0)
Hemoglobin: 11.2 g/dL — ABNORMAL LOW (ref 13.0–17.0)
MCH: 27.5 pg (ref 26.0–34.0)
MCHC: 31.2 g/dL (ref 30.0–36.0)
MCV: 88.2 fL (ref 80.0–100.0)
Platelets: 220 10*3/uL (ref 150–400)
RBC: 4.07 MIL/uL — ABNORMAL LOW (ref 4.22–5.81)
RDW: 22.1 % — ABNORMAL HIGH (ref 11.5–15.5)
WBC: 17.3 10*3/uL — ABNORMAL HIGH (ref 4.0–10.5)
nRBC: 0 % (ref 0.0–0.2)

## 2022-08-28 MED ORDER — UMECLIDINIUM BROMIDE 62.5 MCG/ACT IN AEPB
1.0000 | INHALATION_SPRAY | Freq: Every day | RESPIRATORY_TRACT | Status: DC
  Administered 2022-08-28 – 2022-08-31 (×3): 1 via RESPIRATORY_TRACT
  Filled 2022-08-28: qty 7

## 2022-08-28 MED ORDER — IPRATROPIUM-ALBUTEROL 0.5-2.5 (3) MG/3ML IN SOLN
3.0000 mL | Freq: Three times a day (TID) | RESPIRATORY_TRACT | Status: DC
  Administered 2022-08-28 – 2022-08-29 (×2): 3 mL via RESPIRATORY_TRACT
  Filled 2022-08-28 (×2): qty 3

## 2022-08-28 MED ORDER — IPRATROPIUM-ALBUTEROL 0.5-2.5 (3) MG/3ML IN SOLN
3.0000 mL | Freq: Three times a day (TID) | RESPIRATORY_TRACT | Status: DC
  Administered 2022-08-28 (×2): 3 mL via RESPIRATORY_TRACT
  Filled 2022-08-28 (×2): qty 3

## 2022-08-28 NOTE — Assessment & Plan Note (Signed)
Cr stable relative to baseline 1.3

## 2022-08-28 NOTE — Assessment & Plan Note (Signed)
Continue Xarelto 

## 2022-08-28 NOTE — Progress Notes (Signed)
  Progress Note   Patient: Brendan Hopkins:[id number] DOB: Mar 28, 1936 DOA: 08/27/2022     0 DOS: the patient was seen and examined on 08/28/2022        Brief hospital course: [name] is an 87 year old M with PAF on Xarelto, history of DVT, severe COPD not on home O2 and pneumonia 3 months ago who presented with severe cough, and rigors.  Treated for pneumonia in January, had persistent cough, then in the last few days, cough had progressed to be severe again, with the rigors at home so he came to the ER.  In the ER, chest CT showed multilobar left-sided pneumonia, WBC 14.  Overnight the patient required up to 6 L oxygen.     Assessment and Plan: * PNA (pneumonia) - Continue Ceftiraxone and azithro - Obtain sputum - Aggressive pulmonary toilet  Acute respiratory failure with hypoxia Up to 6L O2 needed here to keep >90%, not on O2 at home.  Paroxysmal atrial fibrillation Was in RVR overnight, resolved with metoprolol. - Continue PO metoprolol - Continue Xarelto  COPD (chronic obstructive pulmonary disease) No wheezing at all, do not suspect COPD flare - Continue LAMA - Scheduled Duonebs here - Hold ICS  CKD stage 3a, GFR 45-59 ml/min Cr stable relative to baseline 1.3  History of DVT - Continue Xarelto  Obesity (BMI 30-39.9) BMI 33.7  Cerebrovascular disease - Continue aspirin          Subjective: Feeling somewhat better, coughing up a lot of sputum, no confusion, no more palpitations or tachycardia, no nursing concerns.     Physical Exam: BP 131/73 (BP Location: Right Arm)   Pulse 71   Temp 97.6 F (36.4 C) (Oral)   Resp 20   Ht 6\' 1"  (1.854 m)   Wt 116 kg   SpO2 93%   BMI 33.74 kg/m   Elderly adult male, sitting on the edge of the bed, interactive RRR, no murmurs, no peripheral edema Respiratory effort appears normal at rest, still on 6 L, lung sounds overall are diminished, but there are absolutely no wheezes, just diminished lung  sounds bilaterally Abdomen soft no tenderness palpation, no distention Attention normal, affect appropriate, judgment insight appear normal    Data Reviewed: Basic metabolic panel unchanged from yesterday CBC shows white count 17, hemoglobin stable CT chest personally reviewed, shows extensive emphysema, extensive left-sided pneumonia, some scoliosis  Family Communication: Son at the bedside    Disposition: Status is: Observation The patient remains on supplemental oxygen, and dyspnea overnight requiring increasing increasing the oxygen up to 6 L.        Author: Alberteen Sam, MD 08/28/2022 11:08 AM  For on call review [email or web address].

## 2022-08-28 NOTE — Assessment & Plan Note (Signed)
BMI 33.7

## 2022-08-28 NOTE — Hospital Course (Signed)
[name] is an 87 year old M with PAF on Xarelto, history of DVT, severe COPD not on home O2 and pneumonia 3 months ago who presented with severe cough, and rigors.  Treated for pneumonia in January, had persistent cough, then in the last few days, cough had progressed to be severe again, with the rigors at home so he came to the ER.  In the ER, chest CT showed multilobar left-sided pneumonia, WBC 14.  Overnight the patient required up to 6 L oxygen.

## 2022-08-28 NOTE — Assessment & Plan Note (Addendum)
Some exacerbation - Continue LAMA, scheduled DuoNebs - Continue prednisone 30 mg daily day 2 - Hold ICS

## 2022-08-28 NOTE — Assessment & Plan Note (Signed)
Was in RVR overnight, resolved with metoprolol. - Continue PO metoprolol - Continue Xarelto

## 2022-08-28 NOTE — Assessment & Plan Note (Signed)
Unable to produce sputum sample yesterday, still on oxygen - Continue Ceftiraxone and azithro - Obtain sputum - Aggressive pulmonary toilet - Tessalon - PT

## 2022-08-28 NOTE — Evaluation (Signed)
Clinical/Bedside Swallow Evaluation Patient Details  Name: Brendan Hopkins MRN: [id number] Date of Birth: 1935-08-04  Today's Date: 08/28/2022 Time: SLP Start Time (ACUTE ONLY): 1345 SLP Stop Time (ACUTE ONLY): 1402 SLP Time Calculation (min) (ACUTE ONLY): 17 min  Past Medical History:  Past Medical History:  Diagnosis Date   Acute deep vein thrombosis (DVT) of tibial vein of right lower extremity    after ankle fracture/surgery (2/21)   Arthritis    RIGHT HIP, HANDS, BACK   Cancer    Skin cancer    Cataract    Cervical radiculopathy    severe C3-4 neuroforaminal stenosis, C5-6 left synovial cyst compressing left nerve root.   Colon polyps    2005   COPD (chronic obstructive pulmonary disease)    SMOKED FOR 65 YRS   Diverticulosis    2005   Fatty liver    GERD (gastroesophageal reflux disease)    Hard of hearing    History of kidney stones    Hypertension    Leg fracture, right    Pain    RIGHT HIP AND BACK   Personal history of colonic polyps-adenoma and sessile serrated polyp 07/15/2010   Pneumonia 03/2019   RBBB    POSS HX AF IN PAST- PAC'S   Shortness of breath    WITH EXERTION ONLY   Stroke    TIA   TIA (transient ischemic attack)    Wears glasses    Past Surgical History:  Past Surgical History:  Procedure Laterality Date   COLONOSCOPY     maybe 3    COLONOSCOPY W/ POLYPECTOMY     CYSTOSCOPY WITH RETROGRADE PYELOGRAM, URETEROSCOPY AND STENT PLACEMENT Left 12/11/2019   Procedure: CYSTOSCOPY WITH LEFT RETROGRADE LEFT  URETEROSCOPY WITH HOLMIUM LASER AND STENT PLACEMENT;  Surgeon: Bjorn Pippin, MD;  Location: [facility];  Service: Urology;  Laterality: Left;   ORIF ANKLE FRACTURE Right 07/11/2019   ORIF ANKLE FRACTURE Right 07/11/2019   Procedure: OPEN REDUCTION INTERNAL FIXATION (ORIF) ANKLE FRACTURE;  Surgeon: Roby Lofts, MD;  Location: [facility];  Service: Orthopedics;  Laterality: Right;   skin cancers removed      TOTAL HIP ARTHROPLASTY Right 01/09/2013    Procedure: RIGHT TOTAL HIP ARTHROPLASTY ANTERIOR APPROACH;  Surgeon: Shelda Pal, MD;  Location: [facility];  Service: Orthopedics;  Laterality: Right;   TOTAL HIP ARTHROPLASTY Left 02/24/2021   Procedure: TOTAL HIP ARTHROPLASTY ANTERIOR APPROACH;  Surgeon: Durene Romans, MD;  Location: [facility];  Service: Orthopedics;  Laterality: Left;   HPI:  Pt is a 87 y.o. male with medical history significant of PAF on Xarelto, TIA, DVT, COPD, remote history of cigarette smoker, presented with worsening of cough and shortness of breath and chills. Patient states that he just recently had pneumonia 3 months ago, but thereafter says 3 weeks ago and then 2 weeks ago.  Patient states that his lungs got messed up after having COVID-19 with pneumonia approximately 3 years ago.  He states that his primary had placed him back on Xarelto about 3 weeks ago. Per previous reports, the pt's son reports that he does not feel like his dad is completely back to his baseline.  He quit smoking several years ago and does not drink alcohol. Pt does not report an increase in coughing or choking with meals specifically and no difficulty with swallowing outside of an isolated incident where he choked on piece of roast beef where he had to recieve heimlich manuever to clear it. The pt  reports taking multiple large pills at once with one sip of water with no over s/s of aspiration.The pt reports this was due to a large bite and inattention and has not occured before or since then. Pt also reports a chronic hoarse voice due to chronic cough.    Assessment / Plan / Recommendation  Clinical Impression  Pt seen for skilled ST evaluation for swallow function. Pt is currently on a regular/thin liquid diet and was assessed thin liquid, puree, and regular solids. [facility] The pt consumed x3 sips of thin liquid via cup sip, x3 bites of puree via spoon, and x2 bites of regular solids with no overt s/s of aspiration. Given success with PO trials  and pt reports, safest diet rec at this time is regular solids (IDDSI 7)/thin liquid (IDDSI 0) with aspiration precautions (reduce distractions, sit upright for meals and 30 minutes following, small bites and sips, slow rate). Skilled ST services no longer required at this time, please reconsult SLP if changes occur. SLP Visit Diagnosis: Dysphagia, unspecified (R13.10)    Aspiration Risk  No limitations    Diet Recommendation Regular;Thin liquid   Liquid Administration via: Spoon;Cup;Straw Medication Administration: Whole meds with liquid Supervision: Patient able to self feed Compensations: Minimize environmental distractions;Small sips/bites;Slow rate Postural Changes: Seated upright at 90 degrees;Remain upright for at least 30 minutes after po intake    Other  Recommendations      Recommendations for follow up therapy are one component of a multi-disciplinary discharge planning process, led by the attending physician.  Recommendations may be updated based on patient status, additional functional criteria and insurance authorization.  Follow up Recommendations No SLP follow up      Assistance Recommended at Discharge    Functional Status Assessment    Frequency and Duration            Prognosis        Swallow Study   General Date of Onset: 08/28/22 HPI: Pt is a 87 y.o. male with medical history significant of PAF on Xarelto, TIA, DVT, COPD, remote history of cigarette smoker, presented with worsening of cough and shortness of breath and chills. Patient states that he just recently had pneumonia 3 months ago, but thereafter says 3 weeks ago and then 2 weeks ago.  Patient states that his lungs got messed up after having COVID-19 with pneumonia approximately 3 years ago.  He states that his primary had placed him back on Xarelto about 3 weeks ago. Per previous reports, the pt's son reports that he does not feel like his dad is completely back to his baseline.  He quit smoking  several years ago and does not drink alcohol. Pt does not report an increase in coughing or choking with meals specifically and no difficulty with swallowing outside of an isolated incident where he choked on piece of roast beef where he had to recieve heimlich manuever to clear it. The pt reports taking multiple large pills at once with one sip of water with no over s/s of aspiration.The pt reports this was due to a large bite and inattention and has not occured before or since then. Pt also reports a chronic hoarse voice due to chronic cough. Type of Study: Bedside Swallow Evaluation Diet Prior to this Study: Regular;Thin liquids (Level 0) Temperature Spikes Noted: No Respiratory Status: Nasal cannula (9L of O2 via [place]) History of Recent Intubation: No Behavior/Cognition: Alert;Cooperative;Pleasant mood;Impulsive Oral Cavity Assessment: Within Functional Limits Oral Cavity -  Dentition: Dentures, bottom;Dentures, top Vision: Functional for self-feeding Self-Feeding Abilities: Able to feed self Patient Positioning: Upright in bed (upright on side of bed) Baseline Vocal Quality: Normal;Hoarse (slightly hoarse) Volitional Cough: Strong Volitional Swallow: Able to elicit    Oral/Motor/Sensory Function Overall Oral Motor/Sensory Function: Within functional limits   Ice Chips     Thin Liquid Thin Liquid: Within functional limits Presentation: Cup    Nectar Thick     Honey Thick     Puree Puree: Within functional limits Presentation: Self Fed;Spoon   Solid     Solid: Within functional limits Presentation: Self Fed      Dione HousekeeperLindsay Yaritzi Craun M.S. CF-SLP

## 2022-08-28 NOTE — Assessment & Plan Note (Signed)
Up to 6L O2 needed here to keep >90%, not on O2 at home.

## 2022-08-28 NOTE — Assessment & Plan Note (Signed)
Continue aspirin 

## 2022-08-29 DIAGNOSIS — J449 Chronic obstructive pulmonary disease, unspecified: Secondary | ICD-10-CM | POA: Diagnosis not present

## 2022-08-29 DIAGNOSIS — J9601 Acute respiratory failure with hypoxia: Secondary | ICD-10-CM | POA: Diagnosis not present

## 2022-08-29 DIAGNOSIS — J189 Pneumonia, unspecified organism: Secondary | ICD-10-CM | POA: Diagnosis not present

## 2022-08-29 DIAGNOSIS — I480 Paroxysmal atrial fibrillation: Secondary | ICD-10-CM | POA: Diagnosis not present

## 2022-08-29 LAB — CBC
HCT: 33.3 % — ABNORMAL LOW (ref 39.0–52.0)
Hemoglobin: 10.4 g/dL — ABNORMAL LOW (ref 13.0–17.0)
MCH: 27.3 pg (ref 26.0–34.0)
MCHC: 31.2 g/dL (ref 30.0–36.0)
MCV: 87.4 fL (ref 80.0–100.0)
Platelets: 212 10*3/uL (ref 150–400)
RBC: 3.81 MIL/uL — ABNORMAL LOW (ref 4.22–5.81)
RDW: 22 % — ABNORMAL HIGH (ref 11.5–15.5)
WBC: 14.6 10*3/uL — ABNORMAL HIGH (ref 4.0–10.5)
nRBC: 0 % (ref 0.0–0.2)

## 2022-08-29 LAB — BASIC METABOLIC PANEL
Anion gap: 7 (ref 5–15)
BUN: 38 mg/dL — ABNORMAL HIGH (ref 8–23)
CO2: 22 mmol/L (ref 22–32)
Calcium: 8.4 mg/dL — ABNORMAL LOW (ref 8.9–10.3)
Chloride: 104 mmol/L (ref 98–111)
Creatinine, Ser: 1.42 mg/dL — ABNORMAL HIGH (ref 0.61–1.24)
GFR, Estimated: 49 mL/min — ABNORMAL LOW (ref >60)
Glucose, Bld: 138 mg/dL — ABNORMAL HIGH (ref 70–99)
Potassium: 4.2 mmol/L (ref 3.5–5.1)
Sodium: 133 mmol/L — ABNORMAL LOW (ref 135–145)

## 2022-08-29 LAB — EXPECTORATED SPUTUM ASSESSMENT W GRAM STAIN, RFLX TO RESP C

## 2022-08-29 LAB — URINE CULTURE: Culture: NO GROWTH

## 2022-08-29 MED ORDER — DOCUSATE SODIUM 100 MG PO CAPS: 100.0000 mg | ORAL_CAPSULE | Freq: Two times a day (BID) | ORAL | Status: DC

## 2022-08-29 MED ORDER — IPRATROPIUM-ALBUTEROL 0.5-2.5 (3) MG/3ML IN SOLN
3.0000 mL | Freq: Two times a day (BID) | RESPIRATORY_TRACT | Status: DC
  Administered 2022-08-29 – 2022-08-31 (×4): 3 mL via RESPIRATORY_TRACT
  Filled 2022-08-29 (×4): qty 3

## 2022-08-29 MED ORDER — PREDNISONE 20 MG PO TABS
30.0000 mg | ORAL_TABLET | Freq: Every day | ORAL | Status: AC
  Administered 2022-08-29 – 2022-08-31 (×3): 30 mg via ORAL
  Filled 2022-08-29 (×3): qty 1

## 2022-08-29 MED ORDER — DOCUSATE SODIUM 100 MG PO CAPS
100.0000 mg | ORAL_CAPSULE | Freq: Two times a day (BID) | ORAL | Status: DC
  Administered 2022-08-29 – 2022-08-31 (×4): 100 mg via ORAL
  Filled 2022-08-29 (×4): qty 1

## 2022-08-29 NOTE — Evaluation (Signed)
Physical Therapy Evaluation Patient Details Name: Brendan Hopkins MRN: [id number] DOB: [birth year] Today's Date: 08/29/2022  History of Present Illness  87 y.o. male admitted on 08/27/22 for Cough, SOB.  Pt dx with multilobar PNA on L, acute respiratory failure with hypoxia, PAF, COPD exacerbation.  Pt with significant PMH of DVT, RBBB, TIA, HOH, R ankle fx s/p ORIF and bil THA.  Clinical Impression  Pt has low activity tolerance before DOE increases to 4/5.  He does require supplemental O2 to maintain sats in the 90s (see separate O2 qualification note).  His HR is 120 bpm during short distance gait as well.       Recommendations for follow up therapy are one component of a multi-disciplinary discharge planning process, led by the attending physician.  Recommendations may be updated based on patient status, additional functional criteria and insurance authorization.  Follow Up Recommendations       Assistance Recommended at Discharge PRN  Patient can return home with the following    PRN supervision/assist from sister Adult nurse Recommendations Other (comment) (home O2)  Recommendations for Other Services       Functional Status Assessment Patient has had a recent decline in their functional status and demonstrates the ability to make significant improvements in function in a reasonable and predictable amount of time.     Precautions / Restrictions Precautions Precautions: Other (comment) Precaution Comments: Monitor O2 sats and HR throughout session      Mobility  Bed Mobility Overal bed mobility: Independent                  Transfers Overall transfer level: Needs assistance   Transfers: Sit to/from Stand Sit to Stand: Supervision           General transfer comment: supervision for safety    Ambulation/Gait Ambulation/Gait assistance: Min guard Gait Distance (Feet): 200 Feet Assistive device: None (he was pushing the O2 tank) Gait  Pattern/deviations: Step-through pattern, Staggering left, Staggering right   Gait velocity interpretation: 1.31 - 2.62 ft/sec, indicative of limited community ambulator   General Gait Details: Pt with mildly staggering gait pattern, supervision for safety.  O2 sats decreased on RA requiring 4L O2 [place] during gait. HR increased from 80s-120s during gait as well.  Stairs            Wheelchair Mobility    Modified Rankin (Stroke Patients Only)       Balance Overall balance assessment: Mild deficits observed, not formally tested                                           Pertinent Vitals/Pain Pain Assessment Pain Assessment: Faces Faces Pain Scale: Hurts a little bit Pain Location: R ankle chronic pain Pain Descriptors / Indicators: Aching Pain Intervention(s): Limited activity within patient's tolerance, Monitored during session, Repositioned    Home Living Family/patient expects to be discharged to:: Private residence Living Arrangements: Alone Available Help at Discharge: Family;Available PRN/intermittently Type of Home: House Home Access: Stairs to enter Entrance Stairs-Rails: Right Entrance Stairs-Number of Steps: 2   Home Layout: One level        Prior Function Prior Level of Function : Independent/Modified Independent             Mobility Comments: independent, mows his yard (riding mower), cooks, Lexicographer, Catering manager.  Has a dog named [place], sister, Gigi Gin  lives nearby.       Hand Dominance   Dominant Hand: Right    Extremity/Trunk Assessment   Upper Extremity Assessment Upper Extremity Assessment: Overall WFL for tasks assessed    Lower Extremity Assessment Lower Extremity Assessment: Overall WFL for tasks assessed    Cervical / Trunk Assessment Cervical / Trunk Assessment: Normal (chart reports h/o cervical radiculopathy)  Communication   Communication: HOH  Cognition Arousal/Alertness: Awake/alert Behavior During Therapy: WFL  for tasks assessed/performed Overall Cognitive Status: Within Functional Limits for tasks assessed                                          General Comments General comments (skin integrity, edema, etc.): Pt demonstrated good understanding of FV and IS and demonstrated both.  IS max inspired volume was 1500 mL.    Exercises     Assessment/Plan    PT Assessment Patient needs continued PT services  PT Problem List Decreased activity tolerance;Cardiopulmonary status limiting activity       PT Treatment Interventions DME instruction;Gait training;Stair training;Functional mobility training;Therapeutic exercise;Therapeutic activities;Balance training;Patient/family education    PT Goals (Current goals can be found in the Care Plan section)  Acute Rehab PT Goals Patient Stated Goal: to return home with O2 if necessary. PT Goal Formulation: With patient Time For Goal Achievement: 09/12/22 Potential to Achieve Goals: Good    Frequency Min 3X/week     Co-evaluation               AM-PAC PT "6 Clicks" Mobility  Outcome Measure Help needed turning from your back to your side while in a flat bed without using bedrails?: None Help needed moving from lying on your back to sitting on the side of a flat bed without using bedrails?: None Help needed moving to and from a bed to a chair (including a wheelchair)?: A Little Help needed standing up from a chair using your arms (e.g., wheelchair or bedside chair)?: A Little Help needed to walk in hospital room?: A Little Help needed climbing 3-5 steps with a railing? : A Little 6 Click Score: 20    End of Session Equipment Utilized During Treatment: Oxygen Activity Tolerance: Patient limited by fatigue Patient left: in bed;with call bell/phone within reach Nurse Communication: Mobility status PT Visit Diagnosis: Difficulty in walking, not elsewhere classified (R26.2)    Time: 1638-4536 PT Time Calculation (min)  (ACUTE ONLY): 55 min   Charges:   PT Evaluation $PT Eval Moderate Complexity: 1 Mod PT Treatments $Gait Training: 8-22 mins $Therapeutic Exercise: 8-22 mins       Corinna Capra, PT, DPT  Acute Rehabilitation Secure chat is best for contact #[phone] office    08/29/2022, 3:24 PM

## 2022-08-29 NOTE — Progress Notes (Signed)
  Progress Note   Patient: Brendan Hopkins GGY:[id number] DOB: [birth year] DOA: 08/27/2022     1 DOS: the patient was seen and examined on 08/29/2022 at 10:40 AM      Brief hospital course: [name] is an 87 year old M with PAF on Xarelto, history of DVT, severe COPD not on home O2 and pneumonia 3 months ago who presented with severe cough, and rigors.  Treated for pneumonia in January, had persistent cough, then in the last few days, cough had progressed to be severe again, with the rigors at home so he came to the ER.  In the ER, chest CT showed multilobar left-sided pneumonia, WBC 14.  Overnight the patient required up to 6 L oxygen.     Assessment and Plan: * Multilobar pneumonia on left Unable to produce sputum sample yesterday, still on oxygen - Continue Ceftiraxone and azithro - Obtain sputum - Aggressive pulmonary toilet - Tessalon - PT  Acute respiratory failure with hypoxia Up to 6L O2 needed here to keep >90%, not on O2 at home.  Weaned down to 3 L today.  Paroxysmal atrial fibrillation Was in RVR overnight, resolved with metoprolol. - Continue PO metoprolol - Continue Xarelto  COPD exacerbation He is wheezing here - Continue LAMA, scheduled DuoNebs - Add prednisone 30 mg daily  - Hold ICS  CKD stage 3a, GFR 45-59 ml/min Cr stable relative to baseline 1.3  History of DVT - Continue Xarelto  Obesity (BMI 30-39.9) BMI 33.7  Cerebrovascular disease - Continue aspirin          Subjective: Feels short of breath, no confusion, still on oxygen.     Physical Exam: BP 122/76 (BP Location: Left Arm)   Pulse 88   Temp 97.8 F (36.6 C) (Oral)   Resp 20   Ht 6\' 1"  (1.854 m)   Wt 116 kg   SpO2 92%   BMI 33.74 kg/m   Elderly adult male, sitting on the edge of the bed, interactive and appropriate RRR, no murmurs, no peripheral edema Respiratory rate seems normal, lung sounds with new wheezes today, diminished overall Abdomen soft no tenderness  palpation or guarding, Attention normal, affect appropriate, judgment insight appear normal    Data Reviewed: White blood cell count down to 14, hemoglobin 10 Creatinine 1.4, no change  Family Communication: Son by phone    Disposition: Status is: Inpatient Patient still on supplemental oxygen, requires ongoing IV antibiotics        Author: Alberteen Sam, MD 08/29/2022 2:59 PM  For on call review [email or web address].

## 2022-08-29 NOTE — Plan of Care (Signed)

## 2022-08-29 NOTE — Progress Notes (Signed)
SATURATION QUALIFICATIONS: (This note is used to comply with regulatory documentation for home oxygen)  Patient Saturations on Room Air at Rest = 92%  Patient Saturations on Room Air while Ambulating = 86%  Patient Saturations on 4 Liters of oxygen while Ambulating = 92% AND HR 120 bpm just went 200'  Please briefly explain why patient needs home oxygen: Pt's O2 sats desaturate with activity on RA.   Corinna Capra, PT, DPT  Acute Rehabilitation Secure chat is best for contact #[phone] office

## 2022-08-30 DIAGNOSIS — J189 Pneumonia, unspecified organism: Secondary | ICD-10-CM | POA: Diagnosis not present

## 2022-08-30 DIAGNOSIS — J449 Chronic obstructive pulmonary disease, unspecified: Secondary | ICD-10-CM | POA: Diagnosis not present

## 2022-08-30 DIAGNOSIS — I480 Paroxysmal atrial fibrillation: Secondary | ICD-10-CM | POA: Diagnosis not present

## 2022-08-30 DIAGNOSIS — J9601 Acute respiratory failure with hypoxia: Secondary | ICD-10-CM | POA: Diagnosis not present

## 2022-08-30 LAB — LEGIONELLA PNEUMOPHILA SEROGP 1 UR AG: L. pneumophila Serogp 1 Ur Ag: NEGATIVE

## 2022-08-30 LAB — CBC
HCT: 38.7 % — ABNORMAL LOW (ref 39.0–52.0)
Hemoglobin: 11.9 g/dL — ABNORMAL LOW (ref 13.0–17.0)
MCH: 27.4 pg (ref 26.0–34.0)
MCHC: 30.7 g/dL (ref 30.0–36.0)
MCV: 89.2 fL (ref 80.0–100.0)
Platelets: 249 10*3/uL (ref 150–400)
RBC: 4.34 MIL/uL (ref 4.22–5.81)
RDW: 22.2 % — ABNORMAL HIGH (ref 11.5–15.5)
WBC: 9.7 10*3/uL (ref 4.0–10.5)
nRBC: 0 % (ref 0.0–0.2)

## 2022-08-30 LAB — BASIC METABOLIC PANEL
Anion gap: 9 (ref 5–15)
BUN: 32 mg/dL — ABNORMAL HIGH (ref 8–23)
CO2: 21 mmol/L — ABNORMAL LOW (ref 22–32)
Calcium: 8.6 mg/dL — ABNORMAL LOW (ref 8.9–10.3)
Chloride: 105 mmol/L (ref 98–111)
Creatinine, Ser: 1.22 mg/dL (ref 0.61–1.24)
GFR, Estimated: 58 mL/min — ABNORMAL LOW (ref >60)
Glucose, Bld: 158 mg/dL — ABNORMAL HIGH (ref 70–99)
Potassium: 4.7 mmol/L (ref 3.5–5.1)
Sodium: 135 mmol/L (ref 135–145)

## 2022-08-30 LAB — MYCOPLASMA PNEUMONIAE ANTIBODY, IGM: Mycoplasma pneumo IgM: <770 U/mL (ref 0–769)

## 2022-08-30 MED ORDER — POLYETHYLENE GLYCOL 3350 17 G PO PACK
17.0000 g | PACK | Freq: Two times a day (BID) | ORAL | Status: DC
  Administered 2022-08-30 – 2022-08-31 (×2): 17 g via ORAL
  Filled 2022-08-30 (×2): qty 1

## 2022-08-30 MED ORDER — BISACODYL 10 MG RE SUPP: 10.0000 mg | Freq: Once | RECTAL | Status: DC

## 2022-08-30 NOTE — Progress Notes (Signed)
SATURATION QUALIFICATIONS: (This note is used to comply with regulatory documentation for home oxygen)  Patient Saturations on Room Air at Rest =92 %  Patient Saturations on Room Air while Ambulating = 87%  Patient Saturations on 2  Liters of oxygen while Ambulating = 93%  Please briefly explain why patient needs home oxygen: needed oxygen  on ambulation.

## 2022-08-30 NOTE — Progress Notes (Signed)
  Progress Note   Patient: Brendan Hopkins [id number]:[id number] DOB: 1936-01-31 DOA: 08/27/2022     2 DOS: the patient was seen and examined on 08/30/2022 at 8:20AM      Brief hospital course: [name] is an 87 year old M with PAF on Xarelto, history of DVT, severe COPD not on home O2 and pneumonia 3 months ago who presented with pneumonia again.     Assessment and Plan: * Multilobar pneumonia on left Two sputum samples not acceptable.  However, improving on Rocephin. - Continue Ceftiraxone and azithro day 4 - Aggressive pulmonary toilet - PT  Acute respiratory failure with hypoxia Up to 6L O2 needed here to keep >90%, not on O2 at home.  Weaned to RA at rest, still desaturating to 87% on room air with ambulation.  Paroxysmal atrial fibrillation Had one episode RVR 4/7, resolved with metoprolol - Continue PO metoprolol - Continue Xarelto  COPD exacerbation Some exacerbation - Continue LAMA, scheduled DuoNebs - Continue prednisone 30 mg daily day 2 - Hold ICS  CKD stage 3a, GFR 45-59 ml/min Cr stable relative to baseline 1.3  History of DVT - Continue Xarelto  Obesity (BMI 30-39.9) BMI 33.7  Cerebrovascular disease - Continue aspirin          Subjective: Patient's breathing is feeling better, he is still bringing up a little bit of sputum.  He still has some wheezing.  He is able to wean down to room air this morning.     Physical Exam: BP (!) 112/101 (BP Location: Left Arm)   Pulse (!) 106   Temp 98.8 F (37.1 C) (Oral)   Resp 20   Ht 6\' 1"  (1.854 m)   Wt 116 kg   SpO2 92%   BMI 33.74 kg/m   Elderly adult male, sitting on the edge of the bed, interactive and appropriate RRR, no murmurs, no peripheral edema Respiratory rate seems normal, lung sounds diminished, wheezing bilaterally Attention normal, affect appropriate, judgment insight appear normal    Data Reviewed: Basic metabolic panel unremarkable White blood cell count down to  normal Hemoglobin unremarkable  Family Communication: Called son by phone    Disposition: Status is: Inpatient Still on oxygen with ambulation today, likely able to be weaned off tomorrow and home        Author: Alberteen Sam, MD 08/30/2022 3:22 PM  For on call review [email or web address].

## 2022-08-30 NOTE — Progress Notes (Signed)
Mobility Specialist: Progress Note   08/30/22 1009  Mobility  Activity Ambulated independently in hallway  Level of Assistance Modified independent, requires aide device or extra time  Assistive Device Other (Comment) (IV pole)  Distance Ambulated (ft) 270 ft  Activity Response Tolerated well  Mobility Referral Yes  $Mobility charge 1 Mobility   Pre-Mobility: 107 HR, 91% SpO2 During Mobility: 128 HR, 86-91% SpO2 Post-Mobility: 98 HR, 87-90% SpO2  Pt received sitting EOB and agreeable to mobility. Stopped x1 for standing break to check pt's sats as seen above. Pt able to recover to >90% SpO2 in <18min. C/o mild SOB, otherwise asymptomatic. Pt sitting EOB after session with call bell and phone in reach.   Brendan Hopkins Mobility Specialist Please contact via SecureChat or Rehab office at [phone]

## 2022-08-30 NOTE — Progress Notes (Signed)
Nurse requested Mobility Specialist to perform oxygen saturation test with pt which includes removing pt from oxygen both at rest and while ambulating.  Below are the results from that testing.     Patient Saturations on Room Air at Rest = spO2 91%  Patient Saturations on Room Air while Ambulating = sp02 86% .  Rested and performed pursed lip breathing for 1 minute with sp02 at 91%.  Reported results to nurse.

## 2022-08-31 ENCOUNTER — Other Ambulatory Visit (HOSPITAL_COMMUNITY): Payer: Self-pay

## 2022-08-31 ENCOUNTER — Ambulatory Visit: Payer: Medicare Other | Admitting: Cardiology

## 2022-08-31 MED ORDER — CEFPODOXIME PROXETIL 200 MG PO TABS
200.0000 mg | ORAL_TABLET | Freq: Two times a day (BID) | ORAL | 0 refills | Status: AC
  Filled 2022-08-31: qty 10, 5d supply, fill #0

## 2022-08-31 MED ORDER — BENZONATATE 200 MG PO CAPS
200.0000 mg | ORAL_CAPSULE | Freq: Three times a day (TID) | ORAL | 0 refills | Status: DC | PRN
  Filled 2022-08-31: qty 20, 7d supply, fill #0

## 2022-08-31 MED ORDER — PREDNISONE 10 MG PO TABS
30.0000 mg | ORAL_TABLET | Freq: Every day | ORAL | 0 refills | Status: AC
  Filled 2022-08-31: qty 9, 3d supply, fill #0

## 2022-08-31 NOTE — Care Management Important Message (Signed)
Important Message  Patient Details  Name: Brendan Hopkins MRN: [id number] Date of Birth: March 19, 1936   Medicare Important Message Given:  Yes     Reyhan Moronta Stefan Church 08/31/2022, 2:17 PM

## 2022-08-31 NOTE — Progress Notes (Signed)
  SATURATION QUALIFICATIONS: (This note is used to comply with regulatory documentation for home oxygen)   Patient Saturations on Room Air at Rest =92 %   Patient Saturations on Room Air while Ambulating = 87%   Patient Saturations on 2  Liters of oxygen while Ambulating = 93%   Please briefly explain why patient needs home oxygen: needed oxygen  on ambulation.

## 2022-08-31 NOTE — Progress Notes (Signed)
Physical Therapy Treatment Patient Details Name: Brendan Hopkins MRN: [id number] DOB: [birth year] Today's Date: 08/31/2022   History of Present Illness 87 y.o. male admitted on 08/27/22 for Cough, SOB.  Pt dx with multilobar PNA on L, acute respiratory failure with hypoxia, PAF, COPD exacerbation.  Pt with significant PMH of DVT, RBBB, TIA, HOH, R ankle fx s/p ORIF and bil THA.    PT Comments    Pt received up ad lib in room, pt agreeable to therapy session for stair and fall risk prevention. Discussed use of gait belt for safety with higher level balance tasks and when fatigued or feeling unbalanced for regular tasks, family or home health therapists can use it for safety/guarding with such tasks, pt agreeable and given gait belt for home. SpO2 90-97% on RA with minimal exertion and pt defers longer hallway ambulation, anticipate he would benefit from supplemental O2 for home given previous desaturation with more exertional tasks in hallway in AM. Pt Supervision for stair sequencing/safety and without LOB, mostly modI otherwise, no loss of balance. Min cues for activity pacing/safety. Pt close to meeting goals, anticipate he would benefit from OP cardiopulmonary rehab if qualified. Pt continues to benefit from PT services to progress toward functional mobility goals.   Recommendations for follow up therapy are one component of a multi-disciplinary discharge planning process, led by the attending physician.  Recommendations may be updated based on patient status, additional functional criteria and insurance authorization.  Follow Up Recommendations       Assistance Recommended at Discharge PRN  Patient can return home with the following     Equipment Recommendations  Other (comment) (supplemental home O2)    Recommendations for Other Services       Precautions / Restrictions Precautions Precautions: Other (comment) Precaution Comments: Monitor O2 sats and HR throughout  session Restrictions Weight Bearing Restrictions: No     Mobility  Bed Mobility               General bed mobility comments: received  up in room    Transfers Overall transfer level: Needs assistance                 General transfer comment: pt received up in room, defers STS trials    Ambulation/Gait Ambulation/Gait assistance: Modified independent (Device/Increase time) Gait Distance (Feet): 30 Feet Assistive device: None Gait Pattern/deviations: Step-through pattern, Decreased stride length       General Gait Details: no LOB, HR 90's bpm, SpO2 90-96% on RA with mild exertion. Pt defers hallway ambulation due to upcoming DC, agreeable to stair trial in the room   Stairs Stairs: Yes Stairs assistance: Supervision Stair Management: Two rails, Step to pattern, Forwards Number of Stairs: 2 General stair comments: pt ascended/descended single 7" step in room x2 reps, with BUE support of counter to simulate rail, no LOB, pt able to verbalize safe sequencing method without cues. SpO2 desat to 90% on RA but improves with standing rest break/pursed-lip breathing, pt defers O2 to be reapplied, RN entering room to remove his IV for DC.   Wheelchair Mobility    Modified Rankin (Stroke Patients Only)       Balance Overall balance assessment: Mild deficits observed, not formally tested                                          Cognition Arousal/Alertness: Awake/alert Behavior  During Therapy: WFL for tasks assessed/performed Overall Cognitive Status: Within Functional Limits for tasks assessed                                 General Comments: pt focused on preparing for DC        Exercises      General Comments General comments (skin integrity, edema, etc.): SpO2 90% and above on RA with light exertion in the room, HR 90's bpm; pt defers longer gait trial and needed 2L O2 to maintain >87% earlier in the day and previous  date.      Pertinent Vitals/Pain Pain Assessment Pain Assessment: No/denies pain Pain Intervention(s): Monitored during session     PT Goals (current goals can now be found in the care plan section) Acute Rehab PT Goals Patient Stated Goal: to return home with O2 if necessary. PT Goal Formulation: With patient Time For Goal Achievement: 09/12/22 Progress towards PT goals: Progressing toward goals    Frequency    Min 3X/week      PT Plan Current plan remains appropriate       AM-PAC PT "6 Clicks" Mobility   Outcome Measure  Help needed turning from your back to your side while in a flat bed without using bedrails?: None Help needed moving from lying on your back to sitting on the side of a flat bed without using bedrails?: None Help needed moving to and from a bed to a chair (including a wheelchair)?: None Help needed standing up from a chair using your arms (e.g., wheelchair or bedside chair)?: None Help needed to walk in hospital room?: None Help needed climbing 3-5 steps with a railing? : A Little 6 Click Score: 23    End of Session Equipment Utilized During Treatment: Gait belt;Other (comment) (pt given gait belt for home) Activity Tolerance: Patient limited by fatigue (preparing for DC) Patient left: with call bell/phone within reach;Other (comment);with nursing/sitter in room (pt up in room, RN in room to assist with IV removal and DC preparation) Nurse Communication: Mobility status PT Visit Diagnosis: Difficulty in walking, not elsewhere classified (R26.2)     Time: 0388-8280 PT Time Calculation (min) (ACUTE ONLY): 8 min  Charges:  $Therapeutic Activity: 8-22 mins                     Kodi Guerrera P., PTA Acute Rehabilitation Services Secure Chat Preferred 9a-5:30pm Office: [phone]    Dorathy Kinsman [facility] 08/31/2022, 11:52 AM

## 2022-08-31 NOTE — Discharge Summary (Signed)
Physician Discharge Summary   Patient: Brendan Hopkins MRN: [id number] DOB: 1935-10-31  Admit date:     08/27/2022  Discharge date: 08/31/22  Discharge Physician: Alberteen Sam   PCP: Donita Brooks, MD     Recommendations at discharge:  Follow up with Dr. Tanya Nones PCP in 1 week Discharged on home O2 Dr. Tanya Nones: Please wean off O2 as able Please refer to Pulmonology for PFTs     Discharge Diagnoses: Principal Problem:   Multilobar pneumonia on left Active Problems:   Acute respiratory failure with hypoxia   Paroxysmal atrial fibrillation   COPD exacerbation   Cerebrovascular disease   Obesity (BMI 30-39.9)   History of DVT   CKD stage 3a, GFR 45-59 ml/min    Hospital Course: [name] is an 87 year old M with PAF on Xarelto, history of DVT, severe COPD not on home O2 and pneumonia 3 months ago who presented with severe cough, and rigors.  Treated for pneumonia in January, had persistent cough, then in the last few days, cough had progressed to be severe again, with the rigors at home so he came to the ER.  In the ER, chest CT showed multilobar left-sided pneumonia, WBC 14.  Overnight the patient required up to 6 L oxygen.      * Multilobar pneumonia on left Admitted and treated with ROcephin and azithromycin.    Patient now mentating at baseline, taking orals.  Temp < 100 F, heart rate < 100bpm, RR < 24.  SpO2 at baseline at rest, I suspect desaturations at rest are due to advanced emphysema, perhaps will be permanent.   Discharged on 4 more days oral antibiotics and home O2.    Paroxysmal atrial fibrillation Had one episode RVR 4/7, resolved with metoprolol, stable on Xarelto.  COPD exacerbation Had wheezing here, CT shows extensive emphysema.  Not on O2 at home  Treated with prednisone, discharged to complete 5 days.     CKD stage 3a, GFR 45-59 ml/min Cr stable relative to baseline 1.3  History of DVT On Xarelto  Obesity (BMI  30-39.9) BMI 33.7              The [facility] Controlled Substances Registry was reviewed for this patient prior to discharge.   Consultants: None Procedures performed: CT chest  Disposition: Home health Diet recommendation:  Regular diet  DISCHARGE MEDICATION: Allergies as of 08/31/2022   No Known Allergies      Medication List     STOP taking these medications    valsartan 80 MG tablet Commonly known as: DIOVAN       TAKE these medications    acetaminophen 325 MG tablet Commonly known as: TYLENOL Take 325 mg by mouth every evening.   albuterol 108 (90 Base) MCG/ACT inhaler Commonly known as: VENTOLIN HFA Inhale 1 to 2  puffs by mouth every 6 hours as needed for shortness of breath What changed: See the new instructions.   amLODipine 5 MG tablet Commonly known as: NORVASC Take 1 tablet by mouth at bedtime What changed: when to take this   aspirin 81 MG chewable tablet Chew 81 mg by mouth at bedtime.   benzonatate 200 MG capsule Commonly known as: TESSALON Take 1 capsule (200 mg total) by mouth 3 (three) times daily as needed for cough.   cefpodoxime 200 MG tablet Commonly known as: VANTIN Take 1 tablet (200 mg total) by mouth 2 (two) times daily for 5 days.   Fish Oil 1200 MG Caps  Take 1,200 mg by mouth every evening.   furosemide 40 MG tablet Commonly known as: LASIX Take 1 tablet by mouth once daily   gabapentin 300 MG capsule Commonly known as: NEURONTIN TAKE 1 CAPSULE BY MOUTH THREE TIMES DAILY AS NEEDED FOR  SCIATICA What changed: See the new instructions.   Iron (Ferrous Sulfate) 325 (65 Fe) MG Tabs Take 325 mg by mouth daily.   metoprolol succinate 25 MG 24 hr tablet Commonly known as: TOPROL-XL Take 1 tablet (25 mg total) by mouth daily.   multivitamin with minerals Tabs tablet Take 1 tablet by mouth daily.   Na Sulfate-K Sulfate-Mg Sulf 17.5-3.13-1.6 GM/177ML Soln Take by mouth.   pantoprazole 40 MG tablet Commonly  known as: PROTONIX Take 1 tablet by mouth every day   predniSONE 10 MG tablet Commonly known as: DELTASONE Take 3 tablets (30 mg total) by mouth daily for 3 days.   rivaroxaban 20 MG Tabs tablet Commonly known as: Xarelto Take 1 tablet (20 mg total) by mouth daily with supper. What changed: when to take this   Trelegy Ellipta 100-62.5-25 MCG/ACT Aepb Generic drug: Fluticasone-Umeclidin-Vilant Inhale 1 puff into the lungs daily.   zolpidem 10 MG tablet Commonly known as: AMBIEN Take 1 tablet by mouth at bedtime as needed for sleep               Durable Medical Equipment  (From admission, onward)           Start     Ordered   08/31/22 1107  DME Oxygen  Once       Question Answer Comment  Length of Need 6 Months   Mode or (Route) Nasal cannula   Liters per Minute 2   Frequency Continuous (stationary and portable oxygen unit needed)   Oxygen delivery system Gas      08/31/22 1110   08/31/22 0957  DME Oxygen  (Discharge Planning)  Once       Question Answer Comment  Length of Need 6 Months   Liters per Minute 2   Oxygen delivery system Gas      08/31/22 0956            Follow-up Information     Donita Brooks, MD. Schedule an appointment as soon as possible for a visit in 1 week(s).   Specialty: Family Medicine Why: April 15 ,2024 @ 2:15 PM Contact information: [address] [place] [address] [phone]                 Discharge Instructions     Discharge instructions   Complete by: As directed    **IMPORTANT DISCHARGE INSTRUCTIONS**   From Dr. Maryfrances Bunnell: You were admitted for pneumonia Here, you were treated with antibiotics and steroids, and you should take a few more days of each when you leave  Take the antibiotic Augmentin for 5 more days Take Augmentin 875-125 mg twice daily starting tonight   Take the steroid prednisone for 3 more days Take prednisone 30 mg (3 tabs) once daily in the morning starting  tomorrow    For the next week, use the oxygen at all times Purchase a pulse oximeter at your pharmacy A pulse oximeter is a small clip that clips on your finger and measures oxygen level For now, the simplest thing is to use the oxygen at all times But if you would like to experiment with it, you may trial taking the oxygen off for short periods of  time to see what your oxygen level is like without it If your oxygen level is MORE THAN 88%, you are okay without the oxygen  If you walk around and exert yourself and your oxygen level stays MORE than 88%, you can talk with Dr. Tanya Nones about stopping the oxygen altogether  If you walk around/exert yourself and your oxygen level DROPS BELOW 88%, that is a sign you need to keep wearing it  Small detail:  HOLD (do not take) your Valsartan until you see Dr. Tanya Nones, and ask him if you should restart it   Increase activity slowly   Complete by: As directed        Discharge Exam: Filed Weights   08/27/22 1054  Weight: 116 kg    General: Pt is alert, awake, not in acute distress, sitting up in chari Cardiovascular: RRR, nl S1-S2, no murmurs appreciated.   No LE edema.   Respiratory: Normal respiratory rate and rhythm.  CTAB without rales or wheezes.  Diminished overall Abdominal: Abdomen soft and non-tender.  No distension or HSM.   Neuro/Psych: Strength symmetric in upper and lower extremities.  Judgment and insight appear normal .   Condition at discharge: stable  The results of significant diagnostics from this hospitalization (including imaging, microbiology, ancillary and laboratory) are listed below for reference.   Imaging Studies: CT CHEST WO CONTRAST  Result Date: 08/27/2022 CLINICAL DATA:  Pneumonia EXAM: CT CHEST WITHOUT CONTRAST TECHNIQUE: Multidetector CT imaging of the chest was performed following the standard protocol without IV contrast. RADIATION DOSE REDUCTION: This exam was performed according to the departmental  dose-optimization program which includes automated exposure control, adjustment of the mA and/or kV according to patient size and/or use of iterative reconstruction technique. COMPARISON:  Previous CT done on 07/03/2022, chest radiograph done earlier today FINDINGS: Cardiovascular: Coronary artery calcifications are seen. There are scattered calcifications in thoracic aorta and its major branches. Mediastinum/Nodes: There are slightly enlarged lymph nodes in mediastinum. Largest of the nodes measures 11 mm in short axis in the aortopulmonary window. There is possible minimal increase in size of mediastinal lymph nodes. Thyroid is smaller than usual in size. Lungs/Pleura: Centrilobular and panlobular emphysema is seen. There is new moderate to large infiltrate in posterior segmental left upper lobe. There are new patchy infiltrates in both lower lobes, more so on the left side. There is no significant pleural effusion. There is no pneumothorax. Left hemidiaphragm is elevated. Upper Abdomen: There multiple small calcific densities in both kidneys measuring up to 6 mm. There is small high density in the dependent portion of gallbladder. There is no wall thickening in gallbladder. There is a 1.3 cm nodule in left adrenal with no significant change, possibly adenoma. Scattered diverticula seen in splenic flexure. Musculoskeletal: No acute findings are seen. IMPRESSION: Severe COPD. New moderate to large sized infiltrates are seen in left upper lobe, left lower lobe and right lower lobe. Findings suggest multifocal pneumonia. There are slightly enlarged lymph nodes in mediastinum, possibly suggesting reactive hyperplasia. Short-term follow-up CT may be considered after medical treatment to assess resolution. Coronary artery disease. Renal stones. Possible gallbladder stone. Diverticula seen in colon. There is a 1.3 cm left adrenal nodule with no significant interval change. Electronically Signed   By: Ernie Avena  M.D.   On: 08/27/2022 15:47   DG Chest Portable 1 View  Result Date: 08/27/2022 CLINICAL DATA:  Chest pain. EXAM: PORTABLE CHEST 1 VIEW COMPARISON:  Chest radiographs 07/03/2022, 07/11/2021, 04/29/2020; CT chest 07/03/2022 FINDINGS:  There are moderately decreased lung volumes. Cardiac silhouette is grossly unchanged and again likely mildly to moderately enlarged. Minimal calcifications within the aortic arch. Moderately decreased lung volumes. Chronic mild elevation of the left hemidiaphragm. There is interstitial thickening and heterogeneous opacification within the inferior left lung. This density and hypoinflation has waxed and waned over multiple prior radiographs, however appears increased from baseline on the current frontal view. No pneumothorax. No acute skeletal abnormality. IMPRESSION: Moderately decreased lung volumes. There is interstitial thickening and heterogeneous opacification within the inferior left lung. This is suspicious for pleural effusion and pneumonia given at appears increased from prior. Electronically Signed   By: Neita Garnet M.D.   On: 08/27/2022 11:22    Microbiology: Results for orders placed or performed during the hospital encounter of 08/27/22  Culture, blood (routine x 2)     Status: None (Preliminary result)   Collection Time: 08/27/22 11:03 AM   Specimen: BLOOD  Result Value Ref Range Status   Specimen Description BLOOD LEFT ANTECUBITAL  Final   Special Requests   Final    BOTTLES DRAWN AEROBIC AND ANAEROBIC Blood Culture adequate volume   Culture   Final    NO GROWTH 4 DAYS Performed at [facility], [address], [place], [place] [address]    Report Status PENDING  Incomplete  SARS Coronavirus 2 by RT PCR (hospital order, performed in [facility] hospital lab) *cepheid single result test*     Status: None   Collection Time: 08/27/22 11:04 AM   Specimen: Nasal Swab  Result Value Ref Range Status   SARS Coronavirus 2 by RT PCR NEGATIVE NEGATIVE  Final    Comment: Performed at [facility], [address], [place], [place] [address]  Culture, blood (routine x 2)     Status: None (Preliminary result)   Collection Time: 08/27/22  4:57 PM   Specimen: BLOOD RIGHT HAND  Result Value Ref Range Status   Specimen Description BLOOD RIGHT HAND  Final   Special Requests   Final    BOTTLES DRAWN AEROBIC ONLY Blood Culture results may not be optimal due to an inadequate volume of blood received in culture bottles   Culture   Final    NO GROWTH 4 DAYS Performed at [facility], [address], [place], [place] [address]    Report Status PENDING  Incomplete  MRSA Next Gen by PCR, Nasal     Status: None   Collection Time: 08/27/22  9:07 PM   Specimen: Nasal Mucosa; Nasal Swab  Result Value Ref Range Status   MRSA by PCR Next Gen NOT DETECTED NOT DETECTED Final    Comment: (NOTE) The GeneXpert MRSA Assay (FDA approved for NASAL specimens only), is one component of a comprehensive MRSA colonization surveillance program. It is not intended to diagnose MRSA infection nor to guide or monitor treatment for MRSA infections. Test performance is not FDA approved in patients less than 22 years old. Performed at [facility], [address] [place], [place] [address]   Urine Culture     Status: None   Collection Time: 08/27/22 11:33 PM   Specimen: Urine, Random  Result Value Ref Range Status   Specimen Description URINE, RANDOM  Final   Special Requests NONE  Final   Culture   Final    NO GROWTH Performed at [facility], [address] [place], [place] [address]    Report Status 08/29/2022 FINAL  Final  Expectorated Sputum Assessment w Gram Stain, Rflx to Resp Cult     Status: None   Collection Time: 08/28/22  4:00 PM   Specimen: Expectorated Sputum  Result Value Ref Range Status   Specimen Description EXPECTORATED SPUTUM  Final   Special Requests NONE  Final   Sputum evaluation   Final    Sputum specimen not  acceptable for testing.  Please recollect.    C/ E. Dala DockFIERKE, RN 08/28/22 2020 A. LAFRANCE Performed at [facility], [address] [place], [place] [address]    Report Status 08/28/2022 FINAL  Final  Expectorated Sputum Assessment w Gram Stain, Rflx to Resp Cult     Status: None   Collection Time: 08/29/22  6:40 PM   Specimen: Expectorated Sputum  Result Value Ref Range Status   Specimen Description EXPECTORATED SPUTUM  Final   Special Requests NONE  Final   Sputum evaluation   Final    Sputum specimen not acceptable for testing.  Please recollect.    C/ A. HODGES, RN 08/29/22 1930 A. LAFRANCE Performed at [facility], [address] [place], [place] [address]    Report Status 08/29/2022 FINAL  Final    Labs: CBC: Recent Labs  Lab 08/25/22 1033 08/27/22 1103 08/28/22 0911 08/29/22 0018 08/30/22 0023  WBC 8.5 14.8* 17.3* 14.6* 9.7  NEUTROABS 5.5 13.0*  --   --   --   HGB 12.0* 11.7* 11.2* 10.4* 11.9*  HCT 37.8* 37.6* 35.9* 33.3* 38.7*  MCV 85.1 87.9 88.2 87.4 89.2  PLT 263.0 231 220 212 249   Basic Metabolic Panel: Recent Labs  Lab 08/27/22 1103 08/27/22 2036 08/28/22 0911 08/29/22 0018 08/30/22 0023  NA 138 137 134* 133* 135  K 4.2 4.2 4.8 4.2 4.7  CL 102 102 100 104 105  CO2 20* 20* 17* 22 21*  GLUCOSE 150* 132* 178* 138* 158*  BUN 24* 25* 29* 38* 32*  CREATININE 1.21 1.40* 1.35* 1.42* 1.22  CALCIUM 8.7* 8.4* 8.6* 8.4* 8.6*  MG  --  1.9  --   --   --   PHOS  --  3.7  --   --   --    Liver Function Tests: Recent Labs  Lab 08/25/22 1033 08/27/22 1103  AST 18 25  ALT 12 17  ALKPHOS 65 64  BILITOT 0.7 0.7  PROT 7.3 7.0  ALBUMIN 4.5 3.9   CBG: No results for input(s): "GLUCAP" in the last 168 hours.  Discharge time spent: approximately 35 minutes spent on discharge counseling, evaluation of patient on day of discharge, and coordination of discharge planning with nursing, social work, pharmacy and case management  Signed: Alberteen Samhristopher P  Lavona Norsworthy, MD [facility] 08/31/2022

## 2022-08-31 NOTE — TOC Transition Note (Signed)
Transition of Care [facility]) - CM/SW Discharge Note   Patient Details  Name: Brendan Hopkins MRN: [id number] Date of Birth: [birth year]  Transition of Care [facility]) CM/SW Contact:  Harriet Masson, RN Phone Number: 08/31/2022, 11:42 AM   Clinical Narrative:    Patient stable for discharge.  Orders for home 02. Patient is agreeable to use in house provider [employer] for DME needs.  Kristin with [employer] notified of oxygen needs Patient has transportation home.  Address, Phone number and PCP verified.    Final next level of care: Home/Self Care Barriers to Discharge: Barriers Resolved   Patient Goals and CMS Choice CMS Medicare.gov Compare Post Acute Care list provided to:: Patient Choice offered to / list presented to : Patient  Discharge Placement                         Discharge Plan and Services Additional resources added to the After Visit Summary for                  DME Arranged: Oxygen   Date DME Agency Contacted: 08/31/22 Time DME Agency Contacted: [phone] Representative spoke with at DME Agency: Belenda Cruise            Social Determinants of Health (SDOH) Interventions SDOH Screenings   Food Insecurity: No Food Insecurity (08/28/2022)  Housing: Low Risk  (08/28/2022)  Transportation Needs: No Transportation Needs (08/28/2022)  Utilities: Not At Risk (08/28/2022)  Alcohol Screen: Low Risk  (03/26/2022)  Depression (PHQ2-9): Medium Risk (06/22/2022)  Financial Resource Strain: Medium Risk (03/26/2022)  Physical Activity: Inactive (03/26/2022)  Social Connections: Socially Isolated (03/26/2022)  Stress: No Stress Concern Present (03/26/2021)  Tobacco Use: Medium Risk (08/28/2022)     Readmission Risk Interventions    08/31/2022   11:29 AM  Readmission Risk Prevention Plan  Transportation Screening Complete  PCP or Specialist Appt within 5-7 Days Complete  Home Care Screening Complete  Medication Review (RN CM) Complete

## 2022-09-01 ENCOUNTER — Telehealth: Payer: Self-pay | Admitting: *Deleted

## 2022-09-01 ENCOUNTER — Telehealth: Payer: Self-pay | Admitting: Internal Medicine

## 2022-09-01 ENCOUNTER — Ambulatory Visit: Payer: Self-pay

## 2022-09-01 ENCOUNTER — Encounter: Payer: Self-pay | Admitting: *Deleted

## 2022-09-01 LAB — CULTURE, BLOOD (ROUTINE X 2)
Culture: NO GROWTH
Culture: NO GROWTH
Special Requests: ADEQUATE

## 2022-09-01 NOTE — Transitions of Care (Post Inpatient/ED Visit) (Signed)
09/01/2022  Name: ILYAAS Hopkins MRN: [id number] DOB: 21-May-1936  Today's TOC FU Call Status: Today's TOC FU Call Status:: Successful TOC FU Call Competed TOC FU Call Complete Date: 09/01/22  Transition Care Management Follow-up Telephone Call Date of Discharge: 08/31/22 Discharge Facility: [facility]) Type of Discharge: Inpatient Admission Primary Inpatient Discharge Diagnosis:: Multilobar pneumonia on left How have you been since you were released from the hospital?: Better Any questions or concerns?: Yes Patient Questions/Concerns:: Scheduled for colonoscopy/endoscopy on 4/17. Should he proceed? Patient Questions/Concerns Addressed: Notified Provider of Patient Questions/Concerns (staff message sent to Dr Leone Payor with [facility] regarding whether patient should reschedule endo/colon procedure)  Items Reviewed: Did you receive and understand the discharge instructions provided?: Yes Medications obtained and verified?: Yes (Medications Reviewed) Any new allergies since your discharge?: No Dietary orders reviewed?: Yes Type of Diet Ordered:: Regular diet Do you have support at home?: Yes People in Home: alone Name of Support/Comfort Primary Source: Brendan Hopkins (son)  Home Care and Equipment/Supplies: Were Home Health Services Ordered?: No Any new equipment or medical supplies ordered?: Yes (Oxygen) Name of Medical supply agency?: [employer] Were you able to get the equipment/medical supplies?: Yes Do you have any questions related to the use of the equipment/supplies?: No  Functional Questionnaire: Do you need assistance with bathing/showering or dressing?: No Do you need assistance with meal preparation?: No Do you need assistance with eating?: No Do you have difficulty maintaining continence: No Do you need assistance with getting out of bed/getting out of a chair/moving?: No Do you have difficulty managing or taking your medications?: No  Follow up appointments  reviewed: PCP Follow-up appointment confirmed?: Yes Date of PCP follow-up appointment?: 09/07/22 Follow-up Provider: [facility] Follow-up appointment confirmed?: NA Do you need transportation to your follow-up appointment?: No Do you understand care options if your condition(s) worsen?: Yes-patient verbalized understanding  SDOH Interventions Today    Flowsheet Row Most Recent Value  SDOH Interventions   Transportation Interventions Intervention Not Indicated  Financial Strain Interventions Intervention Not Indicated      Interventions Today    Flowsheet Row Most Recent Value  Chronic Disease   Chronic disease during today's visit Other  [hospitalization for Multilobar pneumonia on left]  General Interventions   General Interventions Discussed/Reviewed Durable Medical Equipment (DME), Communication with  Durable Medical Equipment (DME) Oxygen, Walker, Other  [Has walker but doesn't use it. Son is buying him a pulse ox today to monitor oxygen. Hasn't needed supplemental O2 but has it. Reports being instructed to use if O2 was below 88% or feeling SOB]  Communication with PCP/Specialists  [Dr Leone Payor with [facility] re: whether he should proceed with colonoscopy/endoscopy on 09/08/22]  Exercise Interventions   Exercise Discussed/Reviewed Physical Activity  Physical Activity Discussed/Reviewed Physical Activity Discussed, Physical Activity Reviewed  Education Interventions   Education Provided Provided Education  Provided Verbal Education On Nutrition, Exercise, Mental Health/Coping with Illness, When to see the doctor  Nutrition Interventions   Nutrition Discussed/Reviewed Nutrition Discussed, Nutrition Reviewed  Pharmacy Interventions   Pharmacy Dicussed/Reviewed Medications and their functions  Safety Interventions   Safety Discussed/Reviewed Safety Discussed, Fall Risk, Home Safety  Home Safety Assistive Devices      TOC Interventions Today    Flowsheet  Row Most Recent Value  TOC Interventions   TOC Interventions Discussed/Reviewed TOC Interventions Discussed, TOC Interventions Reviewed      Demetrios Loll, BSN, RN-BC RN Care Coordinator [facility]  [facility] Direct Dial: [phone] Main #: [phone]  

## 2022-09-01 NOTE — Telephone Encounter (Signed)
[name] - see message below - I received today  We need to cancel upcoming procedures  Once that is done I will figure out f/u plans  Iva Boop, MD, Vanna Scotland, RN  Iva Boop, MD Dr Leone Payor,  Brendan Hopkins was discharged from [facility] yesterday with pneumonia. He has 4 days of antibiotics to complete. He's scheduled for an endo/colon on 09/08/22. I wanted to make you aware in case he needs to reschedule. He's feeling better and is only using supplemental O2 if sats are below 88%. If it's ok to proceed with the procedure, he had some questions about his medications. I reviewed Your directions for stopping xarelto 2 days prior to procedure and I recommended that he stop ASA and fish oil then as well. Can you review and have your staff reach out to him with your recommendations?  Please let me know if I can be of assistance.  Thank you,  Demetrios Loll, BSN, RN-BC RN Care Coordinator [facility]  [facility] Direct Dial: [phone] Main #: [phone]

## 2022-09-01 NOTE — Telephone Encounter (Signed)
Pt made aware of Dr. Leone Payor recommendations: EGD/Colon canceled. Pt made aware. Pt verbalized understanding with all questions answered.

## 2022-09-01 NOTE — Telephone Encounter (Signed)
Pt was made aware of Dr. Leone Payor recommendations: Pt was scheduled for an office visit on 09/30/2022 at 2:50 PM with Dr. Leone Payor . Pt made aware: Pt verbalized understanding with all questions answered.

## 2022-09-01 NOTE — Chronic Care Management (AMB) (Signed)
   09/01/2022  Brendan Hopkins 01/22/1936 [id number]   Reason for Encounter: Patient is not currently enrolled in the CCM program. CCM status changed to previously enrolled.   Katha Cabal RN Care Manager/Chronic Care Management [phone]

## 2022-09-01 NOTE — Telephone Encounter (Signed)
Set him up for early may with an 1130, 350 or banding slot if available

## 2022-09-02 DIAGNOSIS — J449 Chronic obstructive pulmonary disease, unspecified: Secondary | ICD-10-CM | POA: Diagnosis not present

## 2022-09-02 DIAGNOSIS — N1831 Chronic kidney disease, stage 3a: Secondary | ICD-10-CM | POA: Diagnosis not present

## 2022-09-03 ENCOUNTER — Other Ambulatory Visit: Payer: Medicare Other

## 2022-09-06 ENCOUNTER — Ambulatory Visit: Payer: Medicare Other

## 2022-09-07 ENCOUNTER — Other Ambulatory Visit: Payer: Self-pay

## 2022-09-07 ENCOUNTER — Encounter: Payer: Self-pay | Admitting: Family Medicine

## 2022-09-07 ENCOUNTER — Ambulatory Visit (INDEPENDENT_AMBULATORY_CARE_PROVIDER_SITE_OTHER): Payer: Medicare Other | Admitting: Family Medicine

## 2022-09-07 VITALS — BP 130/72 | HR 84 | Temp 98.5°F | Ht 73.0 in | Wt 247.0 lb

## 2022-09-07 DIAGNOSIS — J986 Disorders of diaphragm: Secondary | ICD-10-CM | POA: Diagnosis not present

## 2022-09-07 DIAGNOSIS — J449 Chronic obstructive pulmonary disease, unspecified: Secondary | ICD-10-CM

## 2022-09-07 DIAGNOSIS — J189 Pneumonia, unspecified organism: Secondary | ICD-10-CM | POA: Diagnosis not present

## 2022-09-07 NOTE — Progress Notes (Signed)
Subjective:    Patient ID: CONSTANCE HACKENBERG, male    DOB: 1936-03-10, 87 y.o.   MRN: [id number] Recently admitted to the hospital for CAP-   Discharge Diagnoses: Principal Problem:   Multilobar pneumonia on left Active Problems:   Acute respiratory failure with hypoxia   Paroxysmal atrial fibrillation   COPD exacerbation   Cerebrovascular disease   Obesity (BMI 30-39.9)   History of DVT   CKD stage 3a, GFR 45-59 ml/min       Hospital Course: [name] is an 87 year old M with PAF on Xarelto, history of DVT, severe COPD not on home O2 and pneumonia 3 months ago who presented with severe cough, and rigors.   Treated for pneumonia in January, had persistent cough, then in the last few days, cough had progressed to be severe again, with the rigors at home so he came to the ER.   In the ER, chest CT showed multilobar left-sided pneumonia, WBC 14.  Overnight the patient required up to 6 L oxygen. Multilobar pneumonia on left Admitted and treated with ROcephin and azithromycin.     Patient now mentating at baseline, taking orals.  Temp < 100 F, heart rate < 100bpm, RR < 24.  SpO2 at baseline at rest, I suspect desaturations at rest are due to advanced emphysema, perhaps will be permanent.    Discharged on 4 more days oral antibiotics and home O2.   Paroxysmal atrial fibrillation Had one episode RVR 4/7, resolved with metoprolol, stable on Xarelto.   COPD exacerbation Had wheezing here, CT shows extensive emphysema.  Not on O2 at home   Treated with prednisone, discharged to complete 5 days.      CKD stage 3a, GFR 45-59 ml/min Cr stable relative to baseline 1.3   History of DVT On Xarelto   Obesity (BMI 30-39.9) BMI 33.7     Patient denies any further fever, chills, rigors.  He denies any pleurisy or chest pain or shortness of breath.  The patient has had an elevated left hemidiaphragm on the last several images.  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  I believe this is the reason that he is getting recurrent pneumonias in the left side.  This is either the second or third pneumonia that he has had in the last 4 to 5 months.  He is not using his flutter valve but he is consistently using Trelegy   Past Medical History:  Diagnosis Date   Acute deep vein thrombosis (DVT) of tibial vein of right lower extremity    after ankle fracture/surgery (2/21)   Arthritis    RIGHT HIP, HANDS, BACK   Cancer    Skin cancer    Cataract    Cervical radiculopathy    severe C3-4 neuroforaminal stenosis, C5-6 left synovial cyst compressing left nerve root.   Colon polyps    2005   COPD (chronic obstructive pulmonary disease)    SMOKED FOR 8 YRS   Diverticulosis    2005   Fatty liver    GERD (gastroesophageal reflux disease)    Hard of hearing    History of kidney stones    Hypertension    Leg fracture, right    Pain    RIGHT HIP AND BACK   Personal history of colonic polyps-adenoma and sessile serrated polyp 07/15/2010   Pneumonia 03/2019   RBBB    POSS HX AF IN  PAST- PAC'S   Shortness of breath    WITH EXERTION ONLY   Stroke    TIA   TIA (transient ischemic attack)    Wears glasses    Past Surgical History:  Procedure Laterality Date   COLONOSCOPY     maybe 3    COLONOSCOPY W/ POLYPECTOMY     CYSTOSCOPY WITH RETROGRADE PYELOGRAM, URETEROSCOPY AND STENT PLACEMENT Left 12/11/2019   Procedure: CYSTOSCOPY WITH LEFT RETROGRADE LEFT  URETEROSCOPY WITH HOLMIUM LASER AND STENT PLACEMENT;  Surgeon: Bjorn Pippin, MD;  Location: [facility];  Service: Urology;  Laterality: Left;   ORIF ANKLE FRACTURE Right 07/11/2019   ORIF ANKLE FRACTURE Right 07/11/2019   Procedure: OPEN REDUCTION INTERNAL FIXATION (ORIF) ANKLE FRACTURE;  Surgeon: Roby Lofts, MD;  Location: [facility] OR;  Service: Orthopedics;  Laterality: Right;   skin cancers removed      TOTAL HIP ARTHROPLASTY Right  01/09/2013   Procedure: RIGHT TOTAL HIP ARTHROPLASTY ANTERIOR APPROACH;  Surgeon: Shelda Pal, MD;  Location: [facility];  Service: Orthopedics;  Laterality: Right;   TOTAL HIP ARTHROPLASTY Left 02/24/2021   Procedure: TOTAL HIP ARTHROPLASTY ANTERIOR APPROACH;  Surgeon: Durene Romans, MD;  Location: [facility];  Service: Orthopedics;  Laterality: Left;       No Known Allergies Social History   Socioeconomic History   Marital status: Widowed    Spouse name: Not on file   Number of children: 3   Years of education: 36   Highest education level: Not on file  Occupational History   Occupation: Retired  Tobacco Use   Smoking status: Former    Years: 57    Types: Cigarettes    Quit date: 05/25/2007    Years since quitting: 15.2   Smokeless tobacco: Never   Tobacco comments:    Does not qualify due to age.   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   Social Determinants of Health   Financial Resource Strain: Low Risk  (09/01/2022)   Overall Financial Resource Strain (CARDIA)    Difficulty of Paying Living Expenses: Not very hard  Food Insecurity: No Food Insecurity (08/28/2022)   Hunger Vital Sign    Worried About Running Out of Food in the Last Year: Never true    Ran Out of Food in the Last Year: Never true  Transportation Needs: No Transportation Needs (09/01/2022)   PRAPARE - Administrator, Civil Service (Medical): No    Lack of Transportation (Non-Medical): No  Physical Activity: Inactive (03/26/2022)   Exercise Vital Sign    Days of Exercise per Week: 0 days    Minutes of Exercise per Session: 0 min  Stress: No Stress Concern Present (03/26/2021)   [employer] of Occupational Health -  Occupational Stress Questionnaire    Feeling of Stress : Not at all  Social Connections: Socially Isolated (03/26/2022)   Social Connection and Isolation Panel [NHANES]    Frequency of Communication with Friends and Family: Never    Frequency of Social Gatherings with Friends and Family: Never    Attends Religious Services: 1 to 4 times per year    Active Member of Clubs or  Organizations: No    Attends Banker Meetings: Never    Marital Status: Widowed  Intimate Partner Violence: Not At Risk (08/28/2022)   Humiliation, Afraid, Rape, and Kick questionnaire    Fear of Current or Ex-Partner: No    Emotionally Abused: No    Physically Abused: No    Sexually Abused: No    Review of Systems  Respiratory:  Positive for cough.        Objective:   Physical Exam Vitals reviewed.  Constitutional:      General: He is not in acute distress.    Appearance: Normal appearance. He is normal weight. He is not ill-appearing or toxic-appearing.  Eyes:     General: No visual field deficit. Cardiovascular:     Rate and Rhythm: Normal rate. Rhythm irregularly irregular.     Heart sounds: Normal heart sounds.  Pulmonary:     Effort: Pulmonary effort is normal. No respiratory distress.     Breath sounds: Decreased air movement present. No stridor. Decreased breath sounds present. No wheezing, rhonchi or rales.  Chest:     Chest wall: No tenderness.  Musculoskeletal:     Right lower leg: No edema.     Left lower leg: No edema.  Skin:    Findings: No erythema or rash.  Neurological:     Mental Status: He is alert and oriented to person, place, and time.     Cranial Nerves: Cranial nerves 2-12 are intact. No cranial nerve deficit, dysarthria or facial asymmetry.     Sensory: Sensation is intact.     Motor: No weakness.          Assessment & Plan:  Recurrent pneumonia - Plan: CT Chest W Contrast  Acquired elevated diaphragm  Chronic obstructive pulmonary disease, unspecified  COPD type Clinically, the patient appears to be back to his baseline.  His pulse oximetry is normal on room air.  He states that he is feeling much better.  He has had at least 2 or 3 documented pneumonias in the last 4 to 5 months.  I believe that he has an acquired elevated left hemidiaphragm decreasing his vital capacity causing recurrent pneumonias.  I have recommended incentive spirometry and flutter valve use daily to help with pulmonary toilet.  I will consult pulmonology for any additional advice they have to help reduce his risk of recurrent pneumonia.  Also obtain a CT of the chest in 3 weeks to ensure resolution and rule out any obstructive lesion that may cause recurrent pneumonia

## 2022-09-08 ENCOUNTER — Encounter: Payer: Medicare Other | Admitting: Internal Medicine

## 2022-09-14 ENCOUNTER — Encounter: Payer: Self-pay | Admitting: Family Medicine

## 2022-09-14 ENCOUNTER — Ambulatory Visit (INDEPENDENT_AMBULATORY_CARE_PROVIDER_SITE_OTHER): Payer: Medicare Other | Admitting: Family Medicine

## 2022-09-14 VITALS — BP 128/72 | HR 109 | Ht 73.0 in | Wt 247.0 lb

## 2022-09-14 DIAGNOSIS — H6123 Impacted cerumen, bilateral: Secondary | ICD-10-CM | POA: Diagnosis not present

## 2022-09-14 NOTE — Progress Notes (Signed)
Subjective:    Patient ID: Brendan Hopkins, male    DOB: [birth year], 87 y.o.   MRN: [id number]  09/07/22 Recently admitted to the hospital for CAP-   Discharge Diagnoses: Principal Problem:   Multilobar pneumonia on left Active Problems:   Acute respiratory failure with hypoxia   Paroxysmal atrial fibrillation   COPD exacerbation   Cerebrovascular disease   Obesity (BMI 30-39.9)   History of DVT   CKD stage 3a, GFR 45-59 ml/min       Hospital Course: Brendan Hopkins is an 87 year old M with PAF on Xarelto, history of DVT, severe COPD not on home O2 and pneumonia 3 months ago who presented with severe cough, and rigors.   Treated for pneumonia in January, had persistent cough, then in the last few days, cough had progressed to be severe again, with the rigors at home so he came to the ER.   In the ER, chest CT showed multilobar left-sided pneumonia, WBC 14.  Overnight the patient required up to 6 L oxygen. Multilobar pneumonia on left Admitted and treated with ROcephin and azithromycin.     Patient now mentating at baseline, taking orals.  Temp < 100 F, heart rate < 100bpm, RR < 24.  SpO2 at baseline at rest, I suspect desaturations at rest are due to advanced emphysema, perhaps will be permanent.    Discharged on 4 more days oral antibiotics and home O2.   Paroxysmal atrial fibrillation Had one episode RVR 4/7, resolved with metoprolol, stable on Xarelto.   COPD exacerbation Had wheezing here, CT shows extensive emphysema.  Not on O2 at home   Treated with prednisone, discharged to complete 5 days.      CKD stage 3a, GFR 45-59 ml/min Cr stable relative to baseline 1.3   History of DVT On Xarelto   Obesity (BMI 30-39.9) BMI 33.7     Patient denies any further fever, chills, rigors.  He denies any pleurisy or chest pain or shortness of breath.  The patient has had an elevated left hemidiaphragm on the last several images.  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  I believe this is the reason that he is getting recurrent pneumonias in the left side.  This is either the second or third pneumonia that he has had in the last 4 to 5 months.  He is not using his flutter valve but he is consistently using Trelegy.  At that time, my plan was: Clinically, the patient appears to be back to his baseline.  His pulse oximetry is normal on room air.  He states that he is feeling much better.  He has had at least 2 or 3 documented pneumonias in the last 4 to 5 months.  I believe that he has an acquired elevated left hemidiaphragm decreasing his vital capacity causing recurrent pneumonias.  I have recommended incentive spirometry and flutter valve use daily to help with pulmonary toilet.  I will consult pulmonology for any additional advice they have to help reduce his risk of recurrent pneumonia.  Also obtain a CT of the chest in 3 weeks to ensure resolution and rule out any obstructive lesion that may cause recurrent pneumonia  09/14/22 Patient is getting hearing aids this afternoon.  He saw the audiologist yesterday.  They said that he had bilateral cerumen impactions.  He reports decreased hearing bilaterally and he would like [place] to try to  clean his ears out for him.  He denies any otalgia.  His CT scan has been scheduled for May and does not think however states that his breathing is doing much better and he denies any shortness of breath   Past Medical History:  Diagnosis Date   Acute deep vein thrombosis (DVT) of tibial vein of right lower extremity    after ankle fracture/surgery (2/21)   Arthritis    RIGHT HIP, HANDS, BACK   Cancer    Skin cancer    Cataract    Cervical radiculopathy    severe C3-4 neuroforaminal stenosis, C5-6 left synovial cyst compressing left nerve root.   Colon polyps    2005   COPD (chronic obstructive pulmonary disease)    SMOKED FOR 67 YRS    Diverticulosis    2005   Fatty liver    GERD (gastroesophageal reflux disease)    Hard of hearing    History of kidney stones    Hypertension    Leg fracture, right    Pain    RIGHT HIP AND BACK   Personal history of colonic polyps-adenoma and sessile serrated polyp 07/15/2010   Pneumonia 03/2019   RBBB    POSS HX AF IN PAST- PAC'S   Shortness of breath    WITH EXERTION ONLY   Stroke    TIA   TIA (transient ischemic attack)    Wears glasses    Past Surgical History:  Procedure Laterality Date   COLONOSCOPY     maybe 3    COLONOSCOPY W/ POLYPECTOMY     CYSTOSCOPY WITH RETROGRADE PYELOGRAM, URETEROSCOPY AND STENT PLACEMENT Left 12/11/2019   Procedure: CYSTOSCOPY WITH LEFT RETROGRADE LEFT  URETEROSCOPY WITH HOLMIUM LASER AND STENT PLACEMENT;  Surgeon: Bjorn Pippin, MD;  Location: [facility];  Service: Urology;  Laterality: Left;   ORIF ANKLE FRACTURE Right 07/11/2019   ORIF ANKLE FRACTURE Right 07/11/2019   Procedure: OPEN REDUCTION INTERNAL FIXATION (ORIF) ANKLE FRACTURE;  Surgeon: Roby Lofts, MD;  Location: [facility];  Service: Orthopedics;  Laterality: Right;   skin cancers removed      TOTAL HIP ARTHROPLASTY Right 01/09/2013   Procedure: RIGHT TOTAL HIP ARTHROPLASTY ANTERIOR APPROACH;  Surgeon: Shelda Pal, MD;  Location: [facility];  Service: Orthopedics;  Laterality: Right;   TOTAL HIP ARTHROPLASTY Left 02/24/2021   Procedure: TOTAL HIP ARTHROPLASTY ANTERIOR APPROACH;  Surgeon: Durene Romans, MD;  Location: [facility];  Service: Orthopedics;  Laterality: Left;       No Known Allergies Social History   Socioeconomic History   Marital status: Widowed    Spouse name: Not on file   Number of children: 3   Years of education: 76   Highest education level: Not on file  Occupational History   Occupation: Retired  Tobacco Use   Smoking status: Former    Years: 57    Types: Cigarettes    Quit date: 05/25/2007    Years since quitting: 15.3   Smokeless tobacco: Never   Tobacco  comments:    Does not qualify due to age.   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   Social Determinants of  Health   Financial Resource Strain: Low Risk  (09/01/2022)   Overall Financial Resource Strain (CARDIA)    Difficulty of Paying Living Expenses: Not very hard  Food Insecurity: No Food Insecurity (08/28/2022)   Hunger Vital Sign    Worried About Running Out of Food in the Last Year: Never true    Ran Out of Food in the Last Year: Never true  Transportation Needs: No Transportation Needs (09/01/2022)   PRAPARE - Administrator, Civil Service (Medical): No    Lack of Transportation (Non-Medical): No  Physical Activity: Inactive (03/26/2022)   Exercise Vital Sign    Days of Exercise per Week: 0 days    Minutes of Exercise per Session: 0 min  Stress: No Stress Concern Present (03/26/2021)   [employer] of Occupational Health - Occupational Stress Questionnaire    Feeling of Stress : Not at all  Social Connections: Socially Isolated (03/26/2022)   Social Connection and Isolation Panel [NHANES]    Frequency of Communication with Friends and Family: Never    Frequency of Social Gatherings with Friends and Family: Never    Attends Religious Services: 1 to 4 times per year    Active Member of [employer] or Organizations: No    Attends Banker Meetings: Never    Marital Status: Widowed  Intimate Partner Violence: Not At Risk (08/28/2022)   Humiliation, Afraid, Rape, and Kick questionnaire    Fear of Current or Ex-Partner: No    Emotionally Abused: No    Physically Abused: No    Sexually Abused: No    Review of Systems     Objective:   Physical Exam Vitals reviewed.  Constitutional:       General: He is not in acute distress.    Appearance: Normal appearance. He is normal weight. He is not ill-appearing or toxic-appearing.  HENT:     Right Ear: There is impacted cerumen.     Left Ear: There is impacted cerumen.  Eyes:     General: No visual field deficit. Cardiovascular:     Rate and Rhythm: Normal rate. Rhythm irregularly irregular.     Heart sounds: Normal heart sounds.  Pulmonary:     Effort: Pulmonary effort is normal. No respiratory distress.     Breath sounds: Decreased air movement present. No stridor. Decreased breath sounds present. No wheezing, rhonchi or rales.  Chest:     Chest wall: No tenderness.  Musculoskeletal:     Right lower leg: No edema.     Left lower leg: No edema.  Skin:    Findings: No erythema or rash.  Neurological:     Mental Status: He is alert and oriented to person, place, and time.     Cranial Nerves: Cranial nerves 2-12 are intact. No cranial nerve deficit, dysarthria or facial asymmetry.     Sensory: Sensation is intact.     Motor: No weakness.          Assessment & Plan:  Bilateral impacted cerumen Cerumen was removed bilaterally with irrigation and lavage.  Patient tolerated procedure well without complication

## 2022-09-27 ENCOUNTER — Telehealth: Payer: Self-pay

## 2022-09-27 NOTE — Telephone Encounter (Signed)
Contacted pt & LVM to return call 

## 2022-09-30 ENCOUNTER — Telehealth: Payer: Self-pay

## 2022-09-30 ENCOUNTER — Ambulatory Visit: Payer: Medicare Other | Admitting: Internal Medicine

## 2022-09-30 ENCOUNTER — Encounter: Payer: Self-pay | Admitting: Internal Medicine

## 2022-09-30 ENCOUNTER — Other Ambulatory Visit (INDEPENDENT_AMBULATORY_CARE_PROVIDER_SITE_OTHER): Payer: Medicare Other

## 2022-09-30 VITALS — BP 116/68 | HR 71 | Ht 69.0 in | Wt 253.0 lb

## 2022-09-30 DIAGNOSIS — N1831 Chronic kidney disease, stage 3a: Secondary | ICD-10-CM | POA: Diagnosis not present

## 2022-09-30 DIAGNOSIS — D509 Iron deficiency anemia, unspecified: Secondary | ICD-10-CM

## 2022-09-30 DIAGNOSIS — J449 Chronic obstructive pulmonary disease, unspecified: Secondary | ICD-10-CM | POA: Diagnosis not present

## 2022-09-30 DIAGNOSIS — Z7901 Long term (current) use of anticoagulants: Secondary | ICD-10-CM

## 2022-09-30 DIAGNOSIS — K649 Unspecified hemorrhoids: Secondary | ICD-10-CM

## 2022-09-30 DIAGNOSIS — I480 Paroxysmal atrial fibrillation: Secondary | ICD-10-CM | POA: Diagnosis not present

## 2022-09-30 DIAGNOSIS — J189 Pneumonia, unspecified organism: Secondary | ICD-10-CM | POA: Diagnosis not present

## 2022-09-30 LAB — CBC WITH DIFFERENTIAL/PLATELET
Basophils Absolute: 0.1 10*3/uL (ref 0.0–0.1)
Basophils Relative: 1 % (ref 0.0–3.0)
Eosinophils Absolute: 0.3 10*3/uL (ref 0.0–0.7)
Eosinophils Relative: 3.7 % (ref 0.0–5.0)
HCT: 35.5 % — ABNORMAL LOW (ref 39.0–52.0)
Hemoglobin: 11.6 g/dL — ABNORMAL LOW (ref 13.0–17.0)
Lymphocytes Relative: 27.9 % (ref 12.0–46.0)
Lymphs Abs: 2.1 10*3/uL (ref 0.7–4.0)
MCHC: 32.7 g/dL (ref 30.0–36.0)
MCV: 85.5 fl (ref 78.0–100.0)
Monocytes Absolute: 0.7 10*3/uL (ref 0.1–1.0)
Monocytes Relative: 8.8 % (ref 3.0–12.0)
Neutro Abs: 4.3 10*3/uL (ref 1.4–7.7)
Neutrophils Relative %: 58.6 % (ref 43.0–77.0)
Platelets: 283 10*3/uL (ref 150.0–400.0)
RBC: 4.15 Mil/uL — ABNORMAL LOW (ref 4.22–5.81)
RDW: 19.3 % — ABNORMAL HIGH (ref 11.5–15.5)
WBC: 7.4 10*3/uL (ref 4.0–10.5)

## 2022-09-30 LAB — FERRITIN: Ferritin: 11.8 ng/mL — ABNORMAL LOW (ref 22.0–322.0)

## 2022-09-30 NOTE — Telephone Encounter (Signed)
[facility] Pre-operative Risk Assessment     Request for surgical clearance:     Endoscopy Procedure  What type of surgery is being performed?     EGD & colonoscopy  When is this surgery scheduled?     11/09/2022  What type of clearance is required ?   Pharmacy  Are there any medications that need to be held prior to surgery and how long? Xarelto, 2 days  Practice name and name of physician performing surgery?      [facility]  What is your office phone and fax number?      Phone- [phone]  Fax- [phone]  Anesthesia type (None, local, MAC, general) ?       MAC

## 2022-09-30 NOTE — Patient Instructions (Signed)
Your provider has requested that you go to the basement level for lab work before leaving today. Press "B" on the elevator. The lab is located at the first door on the left as you exit the elevator.  Due to recent changes in healthcare laws, you may see the results of your imaging and laboratory studies on MyChart before your provider has had a chance to review them.  We understand that in some cases there may be results that are confusing or concerning to you. Not all laboratory results come back in the same time frame and the provider may be waiting for multiple results in order to interpret others.  Please give [place] 48 hours in order for your provider to thoroughly review all the results before contacting the office for clarification of your results.   You have been scheduled for an endoscopy and colonoscopy. Please follow the written instructions given to you at your visit today. Please check and see if you have a suprep kit already at home. If not call us. If you use inhalers (even only as needed), please bring them with you on the day of your procedure.  You will be contaced by our office prior to your procedure for directions on holding your Xarelto..  If you do not hear from our office 1 week prior to your scheduled procedure, please call [phone] to discuss. Ask for PJ, CMA  I appreciate the opportunity to care for you. Stan Head, MD, [facility]

## 2022-09-30 NOTE — Progress Notes (Signed)
Brendan Hopkins 86 y.o. [birth year] [id number]  Assessment & Plan:   Encounter Diagnoses  Name Primary?   Iron deficiency anemia, unspecified iron deficiency anemia type Yes   Long term current use of anticoagulant    Paroxysmal atrial fibrillation (HCC)    Multifocal pneumonia    Hemorrhoids, unspecified hemorrhoid type    I think he is improved enough to have his procedures.  He does have some pulmonary follow-up.  I have scheduled him for an EGD and colonoscopy on June 18.  We will hold Xarelto 2 days prior as per protocol to reduce the risk of bleeding.  We had already had clearance on that we can double check.  Recheck CBC and ferritin level today, restart ferrous sulfate.   Lab Results  Component Value Date   WBC 7.4 09/30/2022   HGB 11.6 (L) 09/30/2022   HCT 35.5 (L) 09/30/2022   MCV 85.5 09/30/2022   PLT 283.0 09/30/2022   Lab Results  Component Value Date   FERRITIN 11.8 (L) 09/30/2022      Subjective:   Chief Complaint: Iron deficiency anemia, reschedule procedures  HPI 87 year old white man with a history of colon polyps and iron deficiency anemia diagnosed earlier this year, he had seen Quentin Mulling in April and had an EGD and colonoscopy scheduled but then he developed a pneumonia and was admitted to the hospital.  He was discharged prior to his EGD and colonoscopy appointment but he was using some supplemental oxygen.  I canceled that appointment and had him come in for a visit which she is here for now.  He is improved.  He has a pending CT scan to follow-up his multifocal pneumonia later this month and then an appointment with Dr. [name] of pulmonary for follow-up.  He is no longer using oxygen.  His oxygen saturations have been above 90% and the as needed use is done.  He still feels somewhat weak.  He is not taking iron because he said his prescription ran out and nobody refilled it.  He has not seen any bleeding.  He is not having any altered bowel  habits.  He does have a hemorrhoid or 2 that seem to prolapse at times and wants me to fix those if possible.  I have reviewed hospital notes imaging studies from the April pneumonia admission.  Labs as well.   Assessment and plan from 08/25/2022 note of Quentin Mulling, PA-C:   Iron deficiency anemia due to chronic blood loss EGD 2016 with small erosion, no NSAIDS, no ETOH Colonoscopy severe tics but unremarkable The patient is hemodynamically stable, no overt GI bleeding He does have significant iron deficiency anemia.   He will require diagnostic work-up including an upper and lower endoscopy.   I would like to see what his blood counts and iron look like at this point in time since being on the iron for 2 months. Depending on the iron indices may need to consider iron transfusion outpatient  ER precautions discussed with the patient: severe weakness/dizziness, severe AB pain, vomit blood, shortness of breath or chest pain.    Appropriate for [facility] Risk of bowel prep, conscious sedation, and EGD and colonoscopy were discussed. Risks include but are not limited to dehydration, pain, bleeding, cardiopulmonary process, bowel perforation, or other possible adverse outcomes.. Treatment plan was discussed with patient, and agreed upon.   Gastroesophageal reflux disease, unspecified whether esophagitis present Lifestyle changes discussed, avoid NSAIDS, ETOH Continue on the same medication, reports symptoms are  well controlled.    History of adenomatous polyp of colon 12/27/2013 colonoscopy for personal history of adenomatous polyps excellent prep severe diverticulosis sigmoid colon no polyps removed.   Paroxysmal atrial fibrillation 07/2021 normal EF, normal stress test Patient told to hold his Xarelto for 2 days prior to time of procedure. Will instruct when and how to resume after procedure. We will communicate with his prescribing physician Dr. Jens Som to ensure that holding his Xarelto is  acceptable. We discussed the risk, benefits and alternatives to colonoscopy/endoscopy.  We also discussed the low but real risk of cardiovascular event such as heart attack, stroke, embolism, thrombosis or ischemia/infarct of other organs off Xarelto and explained the need to seek urgent help if this occurs.  He is agreeable and wishes to proceed.   Chronic obstructive pulmonary disease, unspecified COPD type Not on oxygen, no increase work of breathing, RA 94% No Known Allergies Current Meds  Medication Sig   acetaminophen (TYLENOL) 325 MG tablet Take 325 mg by mouth every evening.   albuterol (VENTOLIN HFA) 108 (90 Base) MCG/ACT inhaler Inhale 1 to 2  puffs by mouth every 6 hours as needed for shortness of breath (Patient taking differently: Inhale 1 puff into the lungs every 6 (six) hours as needed for shortness of breath.)   amLODipine (NORVASC) 5 MG tablet Take 1 tablet by mouth at bedtime (Patient taking differently: Take 5 mg by mouth daily.)   aspirin 81 MG chewable tablet Chew 81 mg by mouth at bedtime.   Fluticasone-Umeclidin-Vilant (TRELEGY ELLIPTA) 100-62.5-25 MCG/ACT AEPB Inhale 1 puff into the lungs daily.   furosemide (LASIX) 40 MG tablet Take 1 tablet by mouth once daily   gabapentin (NEURONTIN) 300 MG capsule TAKE 1 CAPSULE BY MOUTH THREE TIMES DAILY AS NEEDED FOR  SCIATICA (Patient taking differently: Take 300 mg by mouth 2 (two) times daily.)   metoprolol succinate (TOPROL-XL) 25 MG 24 hr tablet Take 1 tablet (25 mg total) by mouth daily.   Multiple Vitamin (MULTIVITAMIN WITH MINERALS) TABS Take 1 tablet by mouth daily.   Na Sulfate-K Sulfate-Mg Sulf 17.5-3.13-1.6 GM/177ML SOLN Take by mouth.   Omega-3 Fatty Acids (FISH OIL) 1200 MG CAPS Take 1,200 mg by mouth every evening.   pantoprazole (PROTONIX) 40 MG tablet Take 1 tablet by mouth every day   rivaroxaban (XARELTO) 20 MG TABS tablet Take 1 tablet (20 mg total) by mouth daily with supper. (Patient taking differently: Take  20 mg by mouth daily.)   zolpidem (AMBIEN) 10 MG tablet Take 1 tablet by mouth at bedtime as needed for sleep   Past Medical History:  Diagnosis Date   Acute deep vein thrombosis (DVT) of tibial vein of right lower extremity (HCC)    after ankle fracture/surgery (2/21)   Arthritis    RIGHT HIP, HANDS, BACK   Cancer (HCC)    Skin cancer    Cataract    Cervical radiculopathy    severe C3-4 neuroforaminal stenosis, C5-6 left synovial cyst compressing left nerve root.   Colon polyps    2005   COPD (chronic obstructive pulmonary disease) (HCC)    SMOKED FOR 65 YRS   Diverticulosis    2005   Fatty liver    GERD (gastroesophageal reflux disease)    Hard of hearing    History of kidney stones    Hypertension    Leg fracture, right    Pain    RIGHT HIP AND BACK   Personal history of colonic polyps-adenoma and sessile serrated  polyp 07/15/2010   Pneumonia 03/2019   RBBB    POSS HX AF IN PAST- PAC'S   Shortness of breath    WITH EXERTION ONLY   Stroke (HCC)    TIA   TIA (transient ischemic attack)    Wears glasses    Past Surgical History:  Procedure Laterality Date   COLONOSCOPY     maybe 3    COLONOSCOPY W/ POLYPECTOMY     CYSTOSCOPY WITH RETROGRADE PYELOGRAM, URETEROSCOPY AND STENT PLACEMENT Left 12/11/2019   Procedure: CYSTOSCOPY WITH LEFT RETROGRADE LEFT  URETEROSCOPY WITH HOLMIUM LASER AND STENT PLACEMENT;  Surgeon: Bjorn Pippin, MD;  Location: [facility];  Service: Urology;  Laterality: Left;   ORIF ANKLE FRACTURE Right 07/11/2019   ORIF ANKLE FRACTURE Right 07/11/2019   Procedure: OPEN REDUCTION INTERNAL FIXATION (ORIF) ANKLE FRACTURE;  Surgeon: Roby Lofts, MD;  Location: [facility];  Service: Orthopedics;  Laterality: Right;   skin cancers removed      TOTAL HIP ARTHROPLASTY Right 01/09/2013   Procedure: RIGHT TOTAL HIP ARTHROPLASTY ANTERIOR APPROACH;  Surgeon: Shelda Pal, MD;  Location: [facility];  Service: Orthopedics;  Laterality: Right;   TOTAL HIP ARTHROPLASTY Left  02/24/2021   Procedure: TOTAL HIP ARTHROPLASTY ANTERIOR APPROACH;  Surgeon: Durene Romans, MD;  Location: [facility];  Service: Orthopedics;  Laterality: Left;   Social History   Social History Narrative   Widow    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   Review of Systems  Dec hearing Objective:   Physical Exam BP 116/68   Pulse 71   Ht 5\' 9"  (1.753 m)   Wt 253 lb (114.8 kg)   BMI 37.36 kg/m  NAD Lungs diffusely dec BS Cor distant s1s2 Abd obese

## 2022-10-01 ENCOUNTER — Other Ambulatory Visit: Payer: Self-pay | Admitting: Family Medicine

## 2022-10-01 NOTE — Telephone Encounter (Signed)
I informed Brendan Hopkins to hold his Xarelto 2 days prior to his ECL on 11/09/2022. He verbalized understanding. He was told to call back with any questions or concerns.

## 2022-10-01 NOTE — Telephone Encounter (Signed)
   Patient Name: Brendan Hopkins  DOB: Jan 24, 1936 MRN: [id number]  Primary Cardiologist: Olga Millers, MD  Chart reviewed as part of pre-operative protocol coverage. Pre-op clearance already addressed by colleagues in earlier phone notes. To summarize recommendations:  -Per office protocol, patient can hold Xarelto for 2 days prior to procedure. Please resume when medically safe to do so.  Medical clearance was not requested.  Will route this bundled recommendation to requesting provider via Epic fax function and remove from pre-op pool. Please call with questions.  Sharlene Dory, PA-C 10/01/2022, 1:00 PM

## 2022-10-01 NOTE — Telephone Encounter (Signed)
Patient with diagnosis of afib on Xarelto for anticoagulation.    Procedure: EGD & colonoscopy  Date of procedure: 11/09/22  Patient has remote hx of TIA and provoked DVT  CHA2DS2-VASc Score = 5   This indicates a 7.2% annual risk of stroke. The patient's score is based upon: CHF History: 0 HTN History: 1 Diabetes History: 0 Stroke History: 2 Vascular Disease History: 0 Age Score: 2 Gender Score: 0      CrCl 56 ml/min  Per office protocol, patient can hold Xarelto for 2 days prior to procedure.    **This guidance is not considered finalized until pre-operative APP has relayed final recommendations.**

## 2022-10-01 NOTE — Telephone Encounter (Signed)
Requested Prescriptions  Pending Prescriptions Disp Refills   amLODipine (NORVASC) 5 MG tablet [Pharmacy Med Name: Amlodipine Besylate 5mg  Tablet] 30 tablet 2    Sig: Take 1 tablet by mouth at bedtime     Cardiovascular: Calcium Channel Blockers 2 Failed - 10/01/2022  2:01 AM      Failed - Valid encounter within last 6 months    Recent Outpatient Visits           1 year ago Chronic obstructive pulmonary disease, unspecified COPD type (HCC)   [facility] Pickard, Priscille Heidelberg, MD   1 year ago Left sided sciatica   [facility] Donita Brooks, MD   1 year ago Vertigo   [facility] Donita Brooks, MD   1 year ago S/P total left hip arthroplasty   [facility] Donita Brooks, MD   1 year ago Chronic obstructive pulmonary disease, unspecified COPD type (HCC)   [facility] Pickard, Priscille Heidelberg, MD              Passed - Last BP in normal range    BP Readings from Last 1 Encounters:  09/30/22 116/68         Passed - Last Heart Rate in normal range    Pulse Readings from Last 1 Encounters:  09/30/22 71

## 2022-10-02 DIAGNOSIS — N1831 Chronic kidney disease, stage 3a: Secondary | ICD-10-CM | POA: Diagnosis not present

## 2022-10-02 DIAGNOSIS — J449 Chronic obstructive pulmonary disease, unspecified: Secondary | ICD-10-CM | POA: Diagnosis not present

## 2022-10-08 ENCOUNTER — Ambulatory Visit
Admission: RE | Admit: 2022-10-08 | Discharge: 2022-10-08 | Disposition: A | Discharge status: Home / Self Care | Payer: Medicare Other | Source: Ambulatory Visit | Attending: Family Medicine | Admitting: Family Medicine

## 2022-10-08 DIAGNOSIS — I2584 Coronary atherosclerosis due to calcified coronary lesion: Secondary | ICD-10-CM | POA: Diagnosis not present

## 2022-10-08 DIAGNOSIS — J9811 Atelectasis: Secondary | ICD-10-CM | POA: Diagnosis not present

## 2022-10-08 DIAGNOSIS — J189 Pneumonia, unspecified organism: Secondary | ICD-10-CM

## 2022-10-08 DIAGNOSIS — I700 Atherosclerosis of aorta: Secondary | ICD-10-CM | POA: Diagnosis not present

## 2022-10-08 DIAGNOSIS — J439 Emphysema, unspecified: Secondary | ICD-10-CM | POA: Diagnosis not present

## 2022-10-08 DIAGNOSIS — R918 Other nonspecific abnormal finding of lung field: Secondary | ICD-10-CM | POA: Diagnosis not present

## 2022-10-08 MED ORDER — IOPAMIDOL (ISOVUE-300) INJECTION 61%
75.0000 mL | Freq: Once | INTRAVENOUS | Status: AC | PRN
  Administered 2022-10-08: 75 mL via INTRAVENOUS

## 2022-10-10 ENCOUNTER — Other Ambulatory Visit: Payer: Self-pay | Admitting: Family Medicine

## 2022-10-10 DIAGNOSIS — I10 Essential (primary) hypertension: Secondary | ICD-10-CM

## 2022-10-11 ENCOUNTER — Telehealth: Payer: Self-pay

## 2022-10-11 NOTE — Telephone Encounter (Signed)
Prescription Request  10/11/2022  LOV: 09/14/22  What is the name of the medication or equipment? valsartan (DIOVAN) 80 MG tablet  Have you contacted your pharmacy to request a refill? Yes   Which pharmacy would you like this sent to?  [id number] - [place] ([place]), [place] - [address]  ([place]) [place] [address] Phone: [phone] Fax: [phone]    Patient notified that their request is being sent to the clinical staff for review and that they should receive a response within 2 business days.   Please advise at [facility] [phone]

## 2022-10-13 DIAGNOSIS — C679 Malignant neoplasm of bladder, unspecified: Secondary | ICD-10-CM | POA: Diagnosis not present

## 2022-10-13 DIAGNOSIS — L570 Actinic keratosis: Secondary | ICD-10-CM | POA: Diagnosis not present

## 2022-10-13 DIAGNOSIS — Z85828 Personal history of other malignant neoplasm of skin: Secondary | ICD-10-CM | POA: Diagnosis not present

## 2022-10-13 DIAGNOSIS — L814 Other melanin hyperpigmentation: Secondary | ICD-10-CM | POA: Diagnosis not present

## 2022-10-13 DIAGNOSIS — D225 Melanocytic nevi of trunk: Secondary | ICD-10-CM | POA: Diagnosis not present

## 2022-10-13 DIAGNOSIS — Z86007 Personal history of in-situ neoplasm of skin: Secondary | ICD-10-CM | POA: Diagnosis not present

## 2022-10-13 DIAGNOSIS — Z08 Encounter for follow-up examination after completed treatment for malignant neoplasm: Secondary | ICD-10-CM | POA: Diagnosis not present

## 2022-10-13 DIAGNOSIS — L821 Other seborrheic keratosis: Secondary | ICD-10-CM | POA: Diagnosis not present

## 2022-10-13 NOTE — Telephone Encounter (Signed)
Medication is not on currently on med list. Tried calling pt, LVM for pt to call back

## 2022-10-14 ENCOUNTER — Encounter (HOSPITAL_BASED_OUTPATIENT_CLINIC_OR_DEPARTMENT_OTHER): Payer: Self-pay | Admitting: Pulmonary Disease

## 2022-10-14 ENCOUNTER — Ambulatory Visit (HOSPITAL_BASED_OUTPATIENT_CLINIC_OR_DEPARTMENT_OTHER): Payer: Medicare Other | Admitting: Pulmonary Disease

## 2022-10-14 VITALS — BP 116/68 | HR 74 | Temp 98.1°F | Ht 70.0 in | Wt 252.0 lb

## 2022-10-14 DIAGNOSIS — R0602 Shortness of breath: Secondary | ICD-10-CM

## 2022-10-14 DIAGNOSIS — J449 Chronic obstructive pulmonary disease, unspecified: Secondary | ICD-10-CM

## 2022-10-14 MED ORDER — TRELEGY ELLIPTA 200-62.5-25 MCG/ACT IN AEPB: 1.0000 | INHALATION_SPRAY | Freq: Every day | RESPIRATORY_TRACT | 5 refills | Status: DC

## 2022-10-14 NOTE — Telephone Encounter (Signed)
FYI:   spoke w/pt this morning about his refill request for the Valsartan. Told pt that medication is not currently on med list and that's why it has been denied and he should not be taking it per pcp.   Per pt stated he is taking it. Advise pt to stop.  Is there anything else you would like for me to tell pt or is that enough. Advice if needed. Thanks.

## 2022-10-14 NOTE — Patient Instructions (Signed)
COPD with emphysema --INCREASE Trelegy 200 ONE puff ONCE a day --ORDER pulmonary function --REFER to pulmonary rehab
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
## 2022-10-19 ENCOUNTER — Telehealth: Payer: Self-pay

## 2022-10-19 NOTE — Telephone Encounter (Signed)
LVM for pt to call back. Re: Valsartain

## 2022-10-20 NOTE — Telephone Encounter (Signed)
Spoke w/pt advise to monitor/write down his BP's for the next week and to reported back to [place] before he resume his Valsartan per pcp. Pt voiced understanding.

## 2022-10-31 DIAGNOSIS — J449 Chronic obstructive pulmonary disease, unspecified: Secondary | ICD-10-CM | POA: Diagnosis not present

## 2022-10-31 DIAGNOSIS — N1831 Chronic kidney disease, stage 3a: Secondary | ICD-10-CM | POA: Diagnosis not present

## 2022-11-02 DIAGNOSIS — N1831 Chronic kidney disease, stage 3a: Secondary | ICD-10-CM | POA: Diagnosis not present

## 2022-11-02 DIAGNOSIS — J449 Chronic obstructive pulmonary disease, unspecified: Secondary | ICD-10-CM | POA: Diagnosis not present

## 2022-11-03 ENCOUNTER — Other Ambulatory Visit: Payer: Self-pay | Admitting: Family Medicine

## 2022-11-03 NOTE — Telephone Encounter (Signed)
Requested medication (s) are due for refill today - no  Requested medication (s) are on the active medication list -yes  Future visit scheduled -no  Last refill: 06/04/22 #30 5RF  Notes to clinic: non delegated Rx  Requested Prescriptions  Pending Prescriptions Disp Refills   zolpidem (AMBIEN) 10 MG tablet [Pharmacy Med Name: Zolpidem Tartrate 10mg  Tablet] 30 tablet 5    Sig: Take 1 tablet by mouth at bedtime as needed for sleep     Not Delegated - Psychiatry:  Anxiolytics/Hypnotics Failed - 11/03/2022  2:02 AM      Failed - This refill cannot be delegated      Failed - Urine Drug Screen completed in last 360 days      Failed - Valid encounter within last 6 months    Recent Outpatient Visits           1 year ago Chronic obstructive pulmonary disease, unspecified COPD type (HCC)   [facility] Pickard, Priscille Heidelberg, MD   1 year ago Left sided sciatica   [facility] Tanya Nones, Priscille Heidelberg, MD   1 year ago Vertigo   [facility] Tanya Nones, Priscille Heidelberg, MD   1 year ago S/P total left hip arthroplasty   [facility] Donita Brooks, MD   1 year ago Chronic obstructive pulmonary disease, unspecified COPD type (HCC)   [facility] Pickard, Priscille Heidelberg, MD       Future Appointments             In 1 month Luciano Cutter, MD [facility] at [facility], [facility]               Requested Prescriptions  Pending Prescriptions Disp Refills   zolpidem (AMBIEN) 10 MG tablet [Pharmacy Med Name: Zolpidem Tartrate 10mg  Tablet] 30 tablet 5    Sig: Take 1 tablet by mouth at bedtime as needed for sleep     Not Delegated - Psychiatry:  Anxiolytics/Hypnotics Failed - 11/03/2022  2:02 AM      Failed - This refill cannot be delegated      Failed - Urine Drug Screen completed in last 360 days      Failed - Valid encounter within last 6 months    Recent Outpatient Visits           1 year ago Chronic  obstructive pulmonary disease, unspecified COPD type (HCC)   [facility] Pickard, Priscille Heidelberg, MD   1 year ago Left sided sciatica   [facility] Donita Brooks, MD   1 year ago Vertigo   [facility] Donita Brooks, MD   1 year ago S/P total left hip arthroplasty   [facility] Donita Brooks, MD   1 year ago Chronic obstructive pulmonary disease, unspecified COPD type (HCC)   [facility] Pickard, Priscille Heidelberg, MD       Future Appointments             In 1 month Luciano Cutter, MD [facility] at [facility] [place], [place]

## 2022-11-09 ENCOUNTER — Encounter: Payer: Medicare Other | Admitting: Internal Medicine

## 2022-11-09 ENCOUNTER — Telehealth: Payer: Self-pay | Admitting: *Deleted

## 2022-11-09 MED ORDER — NA SULFATE-K SULFATE-MG SULF 17.5-3.13-1.6 GM/177ML PO SOLN: 1.0000 | Freq: Once | ORAL | 0 refills | Status: AC

## 2022-11-09 NOTE — Telephone Encounter (Signed)
Encounter created to write new prescription for bowel prep. Pt did not hold blood thinner.

## 2022-11-11 ENCOUNTER — Telehealth (HOSPITAL_COMMUNITY): Payer: Self-pay

## 2022-11-11 ENCOUNTER — Encounter: Payer: Self-pay | Admitting: Certified Registered Nurse Anesthetist

## 2022-11-11 NOTE — Telephone Encounter (Signed)
Pt is not interested at this time in pulmonary rehab. Closed referral.

## 2022-11-14 ENCOUNTER — Other Ambulatory Visit: Payer: Self-pay | Admitting: Family Medicine

## 2022-11-17 ENCOUNTER — Other Ambulatory Visit: Payer: Self-pay | Admitting: Internal Medicine

## 2022-11-17 ENCOUNTER — Encounter: Payer: Self-pay | Admitting: Internal Medicine

## 2022-11-17 ENCOUNTER — Ambulatory Visit (AMBULATORY_SURGERY_CENTER): Payer: Medicare Other | Admitting: Internal Medicine

## 2022-11-17 VITALS — BP 120/73 | HR 59 | Temp 97.5°F | Resp 17 | Ht 69.0 in | Wt 253.0 lb

## 2022-11-17 DIAGNOSIS — J449 Chronic obstructive pulmonary disease, unspecified: Secondary | ICD-10-CM | POA: Diagnosis not present

## 2022-11-17 DIAGNOSIS — K635 Polyp of colon: Secondary | ICD-10-CM | POA: Diagnosis not present

## 2022-11-17 DIAGNOSIS — D509 Iron deficiency anemia, unspecified: Secondary | ICD-10-CM | POA: Diagnosis not present

## 2022-11-17 DIAGNOSIS — I10 Essential (primary) hypertension: Secondary | ICD-10-CM | POA: Diagnosis not present

## 2022-11-17 DIAGNOSIS — D123 Benign neoplasm of transverse colon: Secondary | ICD-10-CM | POA: Diagnosis not present

## 2022-11-17 DIAGNOSIS — B3781 Candidal esophagitis: Secondary | ICD-10-CM

## 2022-11-17 DIAGNOSIS — K259 Gastric ulcer, unspecified as acute or chronic, without hemorrhage or perforation: Secondary | ICD-10-CM | POA: Diagnosis not present

## 2022-11-17 DIAGNOSIS — K649 Unspecified hemorrhoids: Secondary | ICD-10-CM

## 2022-11-17 MED ORDER — SODIUM CHLORIDE 0.9 % IV SOLN
500.0000 mL | Freq: Once | INTRAVENOUS | Status: DC
Start: 2022-11-17 — End: 2022-11-17

## 2022-11-17 NOTE — Patient Instructions (Addendum)
I did not find any signs of cancer or anything bad - there was some stomach inflammation (biopsied), a colon polyp (removed) and diverticulosis.  I will let you know results.  Please resume Xarelto tomorrow and also restart iron pills.  Handouts Provided:  Polyps and Diverticulosis  YOU HAD AN ENDOSCOPIC PROCEDURE TODAY AT THE [facility]:   Refer to the procedure report that was given to you for any specific questions about what was found during the examination.  If the procedure report does not answer your questions, please call your gastroenterologist to clarify.  If you requested that your care partner not be given the details of your procedure findings, then the procedure report has been included in a sealed envelope for you to review at your convenience later.  YOU SHOULD EXPECT: Some feelings of bloating in the abdomen. Passage of more gas than usual.  Walking can help get rid of the air that was put into your GI tract during the procedure and reduce the bloating. If you had a lower endoscopy (such as a colonoscopy or flexible sigmoidoscopy) you may notice spotting of blood in your stool or on the toilet paper. If you underwent a bowel prep for your procedure, you may not have a normal bowel movement for a few days.  Please Note:  You might notice some irritation and congestion in your nose or some drainage.  This is from the oxygen used during your procedure.  There is no need for concern and it should clear up in a day or so.  SYMPTOMS TO REPORT IMMEDIATELY:  Following lower endoscopy (colonoscopy or flexible sigmoidoscopy):  Excessive amounts of blood in the stool  Significant tenderness or worsening of abdominal pains  Swelling of the abdomen that is new, acute  Fever of 100F or higher  Following upper endoscopy (EGD)  Vomiting of blood or coffee ground material  New chest pain or pain under the shoulder blades  Painful or persistently difficult swallowing  New  shortness of breath  Fever of 100F or higher  Black, tarry-looking stools  For urgent or emergent issues, a gastroenterologist can be reached at any hour by calling [phone]. Do not use MyChart messaging for urgent concerns.    DIET:  We do recommend a small meal at first, but then you may proceed to your regular diet.  Drink plenty of fluids but you should avoid alcoholic beverages for 24 hours.  ACTIVITY:  You should plan to take it easy for the rest of today and you should NOT DRIVE or use heavy machinery until tomorrow (because of the sedation medicines used during the test).    FOLLOW UP: Our staff will call the number listed on your records the next business day following your procedure.  We will call around 7:15- 8:00 am to check on you and address any questions or concerns that you may have regarding the information given to you following your procedure. If we do not reach you, we will leave a message.     If any biopsies were taken you will be contacted by phone or by letter within the next 1-3 weeks.  Please call us at [phone] if you have not heard about the biopsies in 3 weeks.    SIGNATURES/CONFIDENTIALITY: You and/or your care partner have signed paperwork which will be entered into your electronic medical record.  These signatures attest to the fact that that the information above on your After Visit Summary has been reviewed and  is understood.  Full responsibility of the confidentiality of this discharge information lies with you and/or your care-partner.

## 2022-11-17 NOTE — Progress Notes (Signed)
Report given to PACU, vss 

## 2022-11-17 NOTE — Progress Notes (Signed)
[facility] History and Physical   Primary Care Physician:  Donita Brooks, MD   Reason for Procedure:   Iron deficiency anemia  Plan:    EGD and colonoscopy     HPI: Brendan Hopkins is a 87 y.o. 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   Colon polyps    2005   COPD (chronic obstructive pulmonary disease) (HCC)    SMOKED FOR 14 YRS   Diverticulosis    2005   Fatty liver    GERD (gastroesophageal reflux disease)    Hard of hearing    History of kidney stones    Hypertension    Leg fracture, right    Pain    RIGHT HIP AND BACK   Personal history of colonic polyps-adenoma and sessile serrated polyp 07/15/2010   Pneumonia 03/2019   RBBB    POSS HX AF IN PAST- PAC'S   Shortness of breath    WITH EXERTION ONLY   Stroke (HCC)    TIA   TIA (transient ischemic attack)    Wears glasses     Past Surgical History:  Procedure Laterality Date   COLONOSCOPY     maybe 3    COLONOSCOPY W/ POLYPECTOMY     CYSTOSCOPY WITH RETROGRADE PYELOGRAM, URETEROSCOPY AND STENT PLACEMENT Left 12/11/2019   Procedure: CYSTOSCOPY WITH LEFT RETROGRADE LEFT  URETEROSCOPY WITH HOLMIUM LASER AND STENT PLACEMENT;  Surgeon: Bjorn Pippin, MD;  Location: [facility];  Service: Urology;  Laterality: Left;   ORIF ANKLE FRACTURE Right 07/11/2019   ORIF ANKLE FRACTURE Right 07/11/2019   Procedure: OPEN REDUCTION INTERNAL FIXATION (ORIF) ANKLE FRACTURE;  Surgeon: Roby Lofts, MD;  Location: [facility];  Service: Orthopedics;  Laterality: Right;   skin cancers removed      TOTAL HIP  ARTHROPLASTY Right 01/09/2013   Procedure: RIGHT TOTAL HIP ARTHROPLASTY ANTERIOR APPROACH;  Surgeon: Shelda Pal, MD;  Location: [facility];  Service: Orthopedics;  Laterality: Right;   TOTAL HIP ARTHROPLASTY Left 02/24/2021   Procedure: TOTAL HIP ARTHROPLASTY ANTERIOR APPROACH;  Surgeon: Durene Romans, MD;  Location: [facility];  Service: Orthopedics;  Laterality: Left;    Prior to Admission medications   Medication Sig Start Date End Date Taking? Authorizing Provider  acetaminophen (TYLENOL) 325 MG tablet Take 325 mg by mouth every evening.   Yes [provider]  amLODipine (NORVASC) 5 MG tablet Take 1 tablet by mouth at bedtime 10/01/22  Yes Donita Brooks, MD  aspirin 81 MG chewable tablet Chew 81 mg by mouth at bedtime.   Yes [provider]  Fluticasone-Umeclidin-Vilant (TRELEGY ELLIPTA) 200-62.5-25 MCG/ACT AEPB Inhale 1 puff into the lungs daily. 10/14/22  Yes Luciano Cutter, MD  furosemide (LASIX) 40 MG tablet Take 1 tablet by mouth once daily 07/28/22  Yes Pickard, Priscille Heidelberg, MD  gabapentin (NEURONTIN) 300 MG capsule TAKE 1 CAPSULE BY MOUTH THREE TIMES DAILY AS NEEDED FOR  SCIATICA Patient taking differently: Take 300 mg by mouth 2 (two) times daily.  06/24/22  Yes Donita Brooks, MD  metoprolol succinate (TOPROL-XL) 25 MG 24 hr tablet Take 1 tablet (25 mg total) by mouth daily. 01/15/22  Yes Donita Brooks, MD  Multiple Vitamin (MULTIVITAMIN WITH MINERALS) TABS Take 1 tablet by mouth daily.   Yes [provider]  Omega-3 Fatty Acids (FISH OIL) 1200 MG CAPS Take 1,200 mg by mouth every evening.   Yes [provider]  pantoprazole (PROTONIX) 40 MG tablet Take 1 tablet by mouth every day 06/02/22  Yes Donita Brooks, MD  zolpidem (AMBIEN) 10 MG tablet Take 1 tablet by mouth at bedtime as needed for sleep 11/04/22  Yes Donita Brooks, MD  albuterol (VENTOLIN HFA) 108 (90 Base) MCG/ACT inhaler Inhale 1 to 2  puffs by mouth every 6 hours as needed for  shortness of breath Patient taking differently: Inhale 1 puff into the lungs every 6 (six) hours as needed for shortness of breath. 05/07/22   Donita Brooks, MD  benzonatate (TESSALON) 200 MG capsule Take 1 capsule (200 mg total) by mouth 3 (three) times daily as needed for cough. Patient not taking: Reported on 09/30/2022 08/31/22   Alberteen Sam, MD  Iron, Ferrous Sulfate, 325 (65 Fe) MG TABS Take 325 mg by mouth daily. Patient not taking: Reported on 09/30/2022 08/05/22   Donita Brooks, MD  rivaroxaban (XARELTO) 20 MG TABS tablet Take 1 tablet (20 mg total) by mouth daily. 11/15/22   Donita Brooks, MD    Current Outpatient Medications  Medication Sig Dispense Refill   acetaminophen (TYLENOL) 325 MG tablet Take 325 mg by mouth every evening.     amLODipine (NORVASC) 5 MG tablet Take 1 tablet by mouth at bedtime 30 tablet 2   aspirin 81 MG chewable tablet Chew 81 mg by mouth at bedtime.     Fluticasone-Umeclidin-Vilant (TRELEGY ELLIPTA) 200-62.5-25 MCG/ACT AEPB Inhale 1 puff into the lungs daily. 60 each 5   furosemide (LASIX) 40 MG tablet Take 1 tablet by mouth once daily 30 tablet 11   gabapentin (NEURONTIN) 300 MG capsule TAKE 1 CAPSULE BY MOUTH THREE TIMES DAILY AS NEEDED FOR  SCIATICA (Patient taking differently: Take 300 mg by mouth 2 (two) times daily.) 270 capsule 2   metoprolol succinate (TOPROL-XL) 25 MG 24 hr tablet Take 1 tablet (25 mg total) by mouth daily. 90 tablet 3   Multiple Vitamin (MULTIVITAMIN WITH MINERALS) TABS Take 1 tablet by mouth daily.     Omega-3 Fatty Acids (FISH OIL) 1200 MG CAPS Take 1,200 mg by mouth every evening.     pantoprazole (PROTONIX) 40 MG tablet Take 1 tablet by mouth every day 30 tablet 11   zolpidem (AMBIEN) 10 MG tablet Take 1 tablet by mouth at bedtime as needed for sleep 30 tablet 5   albuterol (VENTOLIN HFA) 108 (90 Base) MCG/ACT inhaler Inhale 1 to 2  puffs by mouth every 6 hours as needed for shortness of breath (Patient taking  differently: Inhale 1 puff into the lungs every 6 (six) hours as needed for shortness of breath.) 6.7 each 11   benzonatate (TESSALON) 200 MG capsule Take 1 capsule (200 mg total) by mouth 3 (three) times daily as needed for cough. (Patient not taking: Reported on 09/30/2022) 20 capsule 0   Iron, Ferrous Sulfate, 325 (65 Fe) MG TABS Take 325 mg by mouth daily. (Patient not taking: Reported on 09/30/2022) 30 tablet 1   rivaroxaban (XARELTO) 20 MG TABS tablet Take 1 tablet (20  mg total) by mouth daily. 90 tablet 1   Current Facility-Administered Medications  Medication Dose Route Frequency Provider Last Rate Last Admin   0.9 %  sodium chloride infusion  500 mL Intravenous Once Iva Boop, MD        Allergies as of 11/17/2022   (No Known Allergies)    Family History  Problem Relation Age of Onset   Cancer Mother        died more of old age at age 23   Cancer Father    Colon cancer Neg Hx    Esophageal cancer Neg Hx    Prostate cancer Neg Hx    Rectal cancer Neg Hx    Stomach cancer Neg Hx     Social History   Socioeconomic History   Marital status: Widowed    Spouse name: Not on file   Number of children: 3   Years of education: 39   Highest education level: Not on file  Occupational History   Occupation: Retired  Tobacco Use   Smoking status: Former    Packs/day: 2.00    Years: 57.00    Additional pack years: 0.00    Total pack years: 114.00    Types: Cigarettes    Quit date: 05/25/2007    Years since quitting: 15.4   Smokeless tobacco: Never   Tobacco comments:    Does not qualify due to age.   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   Social Determinants of Health   Financial Resource Strain:  Low Risk  (09/01/2022)   Overall Financial Resource Strain (CARDIA)    Difficulty of Paying Living Expenses: Not very hard  Food Insecurity: No Food Insecurity (08/28/2022)   Hunger Vital Sign    Worried About Running Out of Food in the Last Year: Never true    Ran Out of Food in the Last Year: Never true  Transportation Needs: No Transportation Needs (09/01/2022)   PRAPARE - Administrator, Civil Service (Medical): No    Lack of Transportation (Non-Medical): No  Physical Activity: Inactive (03/26/2022)   Exercise Vital Sign    Days of Exercise per Week: 0 days    Minutes of Exercise per Session: 0 min  Stress: No Stress Concern Present (03/26/2021)   [employer] of Occupational Health - Occupational Stress Questionnaire    Feeling of Stress : Not at all  Social Connections: Socially Isolated (03/26/2022)   Social Connection and Isolation Panel [NHANES]    Frequency of Communication with Friends and Family: Never    Frequency of Social Gatherings with Friends and Family: Never    Attends Religious Services: 1 to 4 times per year    Active Member of [employer] or Organizations: No    Attends Banker Meetings: Never    Marital Status: Widowed  Intimate Partner Violence: Not At Risk (08/28/2022)   Humiliation, Afraid, Rape, and Kick questionnaire    Fear of Current or Ex-Partner: No    Emotionally Abused: No    Physically Abused: No    Sexually Abused: No    Review of Systems:  All other review of systems negative  except as mentioned in the HPI.  Physical Exam: Vital signs BP (!) 146/94   Pulse 80   Temp (!) 97.5 F (36.4 C)   Ht 5\' 9"  (1.753 m)   Wt 253 lb (114.8 kg)   SpO2 94%   BMI 37.36 kg/m   General:   Alert,  Well-developed, well-nourished, pleasant and cooperative in NAD Lungs:  Clear throughout to auscultation.   Heart:  Regular rate and rhythm; no murmurs, clicks, rubs,  or gallops. Abdomen:  Soft, nontender and nondistended. Normal bowel  sounds.   Neuro/Psych:  Alert and cooperative. Normal mood and affect. A and O x 3   @Emmarie Sannes  Sena Slate, MD, [facility] [phone] (pager) 11/17/2022 2:26 PM@

## 2022-11-17 NOTE — Progress Notes (Signed)
1433 Robinul 0.1 mg IV given due large amount of secretions upon assessment.  MD made aware, vss  
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
## 2022-11-17 NOTE — Op Note (Signed)
[facility] Patient Name: Brendan Hopkins Procedure Date: 11/17/2022 2:24 PM MRN: [id number] Endoscopist: Iva Boop , MD, [id number] Age: 87 Referring MD:  Date of Birth: Oct 03, 1935 Gender: Male Account #: [id number] Procedure:                Upper GI endoscopy Indications:              Iron deficiency anemia Medicines:                Monitored Anesthesia Care Procedure:                Pre-Anesthesia Assessment:                           - Prior to the procedure, a History and Physical                            was performed, and patient medications and                            allergies were reviewed. The patient's tolerance of                            previous anesthesia was also reviewed. The risks                            and benefits of the procedure and the sedation                            options and risks were discussed with the patient.                            All questions were answered, and informed consent                            was obtained. Prior Anticoagulants: The patient has                            taken no anticoagulant or antiplatelet agents. ASA                            Grade Assessment: III - A patient with severe                            systemic disease. After reviewing the risks and                            benefits, the patient was deemed in satisfactory                            condition to undergo the procedure.                           After obtaining informed consent, the endoscope was  passed under direct vision. Throughout the                            procedure, the patient's blood pressure, pulse, and                            oxygen saturations were monitored continuously. The                            GIF [id number] #[id number] was introduced through the                            mouth, and advanced to the second part of duodenum.                            The upper GI endoscopy was  accomplished without                            difficulty. The patient tolerated the procedure                            well. Scope In: Scope Out: Findings:                 A few localized small erosions with stigmata of                            recent bleeding were found in the prepyloric region                            of the stomach. Biopsies were taken with a cold                            forceps for histology. Verification of patient                            identification for the specimen was done. Estimated                            blood loss was minimal.                           Patchy, yellow plaques were found in the upper                            third of the esophagus and in the middle third of                            the esophagus.                           The cardia and gastric fundus were normal on                            retroflexion.  The exam was otherwise without abnormality. Complications:            No immediate complications. Estimated Blood Loss:     Estimated blood loss was minimal. Impression:               - Erosive gastropathy with stigmata of recent                            bleeding. Biopsied.                           - Esophageal plaques were found, consistent with                            candidiasis. Just a few and I think related to                            steroid inhalers.                           - The examination was otherwise normal. Recommendation:           - Patient has a contact number available for                            emergencies. The signs and symptoms of potential                            delayed complications were discussed with the                            patient. Return to normal activities tomorrow.                            Written discharge instructions were provided to the                            patient.                           - Resume previous diet.                            - Continue present medications.                           - Await pathology results.                           - See the other procedure note for documentation of                            additional recommendations. Iva Boop, MD 11/17/2022 3:10:06 PM This report has been signed electronically.

## 2022-11-17 NOTE — Progress Notes (Signed)
Called to room to assist during endoscopic procedure.  Patient ID and intended procedure confirmed with present staff. Received instructions for my participation in the procedure from the performing physician.  

## 2022-11-17 NOTE — Op Note (Signed)
[facility] Patient Name: Daegen Berrocal Procedure Date: 11/17/2022 2:19 PM MRN: [id number] Endoscopist: Iva Boop , MD, [id number] Age: 87 Referring MD:  Date of Birth: 18-Dec-1935 Gender: Male Account #: [id number] Procedure:                Colonoscopy Indications:              Iron deficiency anemia Medicines:                Monitored Anesthesia Care Procedure:                Pre-Anesthesia Assessment:                           - Prior to the procedure, a History and Physical                            was performed, and patient medications and                            allergies were reviewed. The patient's tolerance of                            previous anesthesia was also reviewed. The risks                            and benefits of the procedure and the sedation                            options and risks were discussed with the patient.                            All questions were answered, and informed consent                            was obtained. Prior Anticoagulants: The patient                            last took Xarelto (rivaroxaban) 2 days prior to the                            procedure. ASA Grade Assessment: III - A patient                            with severe systemic disease. After reviewing the                            risks and benefits, the patient was deemed in                            satisfactory condition to undergo the procedure.                           After obtaining informed consent, the colonoscope  was passed under direct vision. Throughout the                            procedure, the patient's blood pressure, pulse, and                            oxygen saturations were monitored continuously. The                            CF [id number] #[id number] was introduced through the anus                            and advanced to the the cecum, identified by                            appendiceal orifice  and ileocecal valve. The                            colonoscopy was performed without difficulty. The                            patient tolerated the procedure well. The quality                            of the bowel preparation was good. The ileocecal                            valve, appendiceal orifice, and rectum were                            photographed. Scope In: 2:43:53 PM Scope Out: 2:53:27 PM Scope Withdrawal Time: 0 hours 7 minutes 10 seconds  Total Procedure Duration: 0 hours 9 minutes 34 seconds  Findings:                 The perianal and digital rectal examinations were                            normal.                           A diminutive polyp was found in the transverse                            colon. The polyp was sessile. The polyp was removed                            with a cold snare. Resection and retrieval were                            complete. Verification of patient identification                            for the specimen was done. Estimated blood loss was  minimal.                           Multiple diverticula were found in the sigmoid                            colon.                           Internal hemorrhoids were found. [Size/Grade].                           The exam was otherwise without abnormality on                            direct and retroflexion views. Complications:            No immediate complications. Estimated Blood Loss:     Estimated blood loss was minimal. Impression:               - One diminutive polyp in the transverse colon,                            removed with a cold snare. Resected and retrieved.                           - Diverticulosis in the sigmoid colon.                           - Internal hemorrhoids.                           - The examination was otherwise normal on direct                            and retroflexion views. Recommendation:           - Patient has a contact  number available for                            emergencies. The signs and symptoms of potential                            delayed complications were discussed with the                            patient. Return to normal activities tomorrow.                            Written discharge instructions were provided to the                            patient.                           - Resume previous diet.                           -  Continue present medications.                           - Resume Xarelto (rivaroxaban) at prior dose                            tomorrow.                           - No repeat colonoscopy due to age.                           - CBC and ferritin today (ordered) Iva Boop, MD 11/17/2022 3:13:43 PM This report has been signed electronically.

## 2022-11-18 ENCOUNTER — Telehealth: Payer: Self-pay | Admitting: *Deleted

## 2022-11-18 NOTE — Telephone Encounter (Signed)
Post procedure follow up phone call. No answer at number given.  Left message on voicemail.  

## 2022-11-29 ENCOUNTER — Telehealth (HOSPITAL_BASED_OUTPATIENT_CLINIC_OR_DEPARTMENT_OTHER): Payer: Self-pay | Admitting: Pulmonary Disease

## 2022-11-29 ENCOUNTER — Telehealth: Payer: Self-pay | Admitting: Internal Medicine

## 2022-11-29 DIAGNOSIS — J449 Chronic obstructive pulmonary disease, unspecified: Secondary | ICD-10-CM

## 2022-11-29 NOTE — Telephone Encounter (Signed)
Patient called stating when rehab referral was placed for him he was not ready to schedule due to other things going on, but is ready to schedule now. Referral has been closed by cardiac rehab department and will need a new referral sent. Please advise.

## 2022-11-29 NOTE — Telephone Encounter (Signed)
Patient or family needs a call re: EGD and colonoscopy pathology  1) Stomach biopsies benign inflammation  2) colon polyp benign also  He was supposed to do CBC and ferritin after EGd and colonoscopy  He needs to get those labs, please

## 2022-11-30 DIAGNOSIS — J449 Chronic obstructive pulmonary disease, unspecified: Secondary | ICD-10-CM | POA: Diagnosis not present

## 2022-11-30 DIAGNOSIS — N1831 Chronic kidney disease, stage 3a: Secondary | ICD-10-CM | POA: Diagnosis not present

## 2022-11-30 NOTE — Telephone Encounter (Signed)
Pt was made aware of recent results and Dr. Leone Payor recommendations: Pt was made aware that he needs to come by our lab to have labs drawn that Dr. Leone Payor is requesting. Location to lab provided.  Pt verbalized understanding with all questions answered.

## 2022-11-30 NOTE — Telephone Encounter (Signed)
Rehab referral resent.

## 2022-12-02 DIAGNOSIS — J449 Chronic obstructive pulmonary disease, unspecified: Secondary | ICD-10-CM | POA: Diagnosis not present

## 2022-12-02 DIAGNOSIS — N1831 Chronic kidney disease, stage 3a: Secondary | ICD-10-CM | POA: Diagnosis not present

## 2022-12-06 ENCOUNTER — Other Ambulatory Visit: Payer: Self-pay | Admitting: Pulmonary Disease

## 2022-12-06 ENCOUNTER — Encounter (HOSPITAL_BASED_OUTPATIENT_CLINIC_OR_DEPARTMENT_OTHER): Payer: Medicare Other

## 2022-12-06 ENCOUNTER — Ambulatory Visit (HOSPITAL_BASED_OUTPATIENT_CLINIC_OR_DEPARTMENT_OTHER): Payer: Medicare Other | Admitting: Pulmonary Disease

## 2022-12-06 DIAGNOSIS — J449 Chronic obstructive pulmonary disease, unspecified: Secondary | ICD-10-CM

## 2022-12-07 ENCOUNTER — Ambulatory Visit: Payer: Medicare Other | Admitting: Cardiology

## 2022-12-08 ENCOUNTER — Ambulatory Visit (HOSPITAL_COMMUNITY): Payer: Medicare Other

## 2022-12-09 ENCOUNTER — Telehealth (HOSPITAL_COMMUNITY): Payer: Self-pay

## 2022-12-09 NOTE — Telephone Encounter (Signed)
Called to confirm appt. Pt confirmed appt. Instructed pt on proper footwear. Gave directions along with department number.

## 2022-12-10 ENCOUNTER — Encounter (HOSPITAL_COMMUNITY)
Admission: RE | Admit: 2022-12-10 | Discharge: 2022-12-10 | Disposition: A | Discharge status: Home / Self Care | Payer: Medicare Other | Source: Ambulatory Visit | Attending: Pulmonary Disease | Admitting: Pulmonary Disease

## 2022-12-10 ENCOUNTER — Encounter (HOSPITAL_COMMUNITY): Payer: Self-pay

## 2022-12-10 VITALS — BP 132/62 | HR 89 | Wt 245.4 lb

## 2022-12-10 DIAGNOSIS — R0602 Shortness of breath: Secondary | ICD-10-CM | POA: Diagnosis not present

## 2022-12-10 DIAGNOSIS — U099 Post covid-19 condition, unspecified: Secondary | ICD-10-CM | POA: Diagnosis not present

## 2022-12-10 NOTE — Progress Notes (Signed)
   12/10/22 1101  6 Minute Walk  Phase Initial  Distance 920 feet  Walk Time 6 minutes  # of Rest Breaks 1  MPH 1.74  METS 1.19  RPE 11  Perceived Dyspnea  1  VO2 Peak 4.16  Resting HR 89 bpm  Resting BP 132/62  Resting Oxygen Saturation  89 %  Exercise Oxygen Saturation  during 6 min walk 86 %  Max Ex. HR 100 bpm  Max Ex. BP 160/80  2 Minute Post BP 140/80  Interval HR  Interval Heart Rate? Yes  1 Minute HR 76  2 Minute HR 85  3 Minute HR 93  4 Minute HR 95  5 Minute HR 100  6 Minute HR 97  2 Minute Post HR 77  Interval Oxygen  Interval Oxygen? Yes  Baseline Oxygen Saturation % 93 %  1 Minute Oxygen Saturation % 88 %  1 Minute Liters of Oxygen 0 L  2 Minute Oxygen Saturation % 86 % (applied 2L)  2 Minute Liters of Oxygen 2 L  3 Minute Oxygen Saturation % 88 %  3 Minute Liters of Oxygen 2 L  4 Minute Oxygen Saturation % 87 %  4 Minute Liters of Oxygen 2 L  5 Minute Oxygen Saturation % 90 %  5 Minute Liters of Oxygen 2 L  6 Minute Oxygen Saturation % 91 %  6 Minute Liters of Oxygen 2 L  2 Minute Post Oxygen Saturation % 96 %  2 Minute Post Liters of Oxygen 2 L

## 2022-12-10 NOTE — Progress Notes (Signed)
Brendan Hopkins 87 y.o. male  Initial Psychosocial Assessment  Pt psychosocial assessment reveals pt lives alone. Pt is currently retired. 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 Pt reports his  stress level is low. Areas of stress/anxiety include health.  Pt does not exhibit signs of depression. Pt shows good  coping skills with positive outlook . Offered emotional support and reassurance. Monitor and evaluate progress toward psychosocial goal(s).  Goal(s): Improved management of stress Improved coping skills Help patient work toward returning to meaningful activities that improve patient's QOL and are attainable with patient's lung disease   12/10/2022 10:57 AM

## 2022-12-10 NOTE — Progress Notes (Signed)
Pulmonary Individual Treatment Plan  Patient Details  Name: Brendan Hopkins MRN: [id number] Date of Birth: [birth year] Referring Provider:   Doristine Devoid Pulmonary Rehab Walk Test from 12/10/2022 in [facility]  Referring Provider [name]       Initial Encounter Date:  Flowsheet Row Pulmonary Rehab Walk Test from 12/10/2022 in [facility]  Date 12/10/22       Visit Diagnosis: Shortness of breath  Post covid-19 condition, unspecified  Patient's Home Medications on Admission:   Current Outpatient Medications:    acetaminophen (TYLENOL) 325 MG tablet, Take 325 mg by mouth every evening., Disp: , Rfl:    albuterol (VENTOLIN HFA) 108 (90 Base) MCG/ACT inhaler, Inhale 1 to 2  puffs by mouth every 6 hours as needed for shortness of breath (Patient taking differently: Inhale 1 puff into the lungs every 6 (six) hours as needed for shortness of breath.), Disp: 6.7 each, Rfl: 11   amLODipine (NORVASC) 5 MG tablet, Take 1 tablet by mouth at bedtime, Disp: 30 tablet, Rfl: 2   aspirin 81 MG chewable tablet, Chew 81 mg by mouth at bedtime., Disp: , Rfl:    benzonatate (TESSALON) 200 MG capsule, Take 1 capsule (200 mg total) by mouth 3 (three) times daily as needed for cough., Disp: 20 capsule, Rfl: 0   Fluticasone-Umeclidin-Vilant (TRELEGY ELLIPTA) 200-62.5-25 MCG/ACT AEPB, Inhale 1 puff into the lungs daily., Disp: 60 each, Rfl: 5   furosemide (LASIX) 40 MG tablet, Take 1 tablet by mouth once daily, Disp: 30 tablet, Rfl: 11   gabapentin (NEURONTIN) 300 MG capsule, TAKE 1 CAPSULE BY MOUTH THREE TIMES DAILY AS NEEDED FOR  SCIATICA (Patient taking differently: Take 300 mg by mouth 2 (two) times daily.), Disp: 270 capsule, Rfl: 2   Iron, Ferrous Sulfate, 325 (65 Fe) MG TABS, Take 325 mg by mouth daily., Disp: 30 tablet, Rfl: 1   metoprolol succinate (TOPROL-XL) 25 MG 24 hr tablet,  Take 1 tablet (25 mg total) by mouth daily., Disp: 90 tablet, Rfl: 3   Multiple Vitamin (MULTIVITAMIN WITH MINERALS) TABS, Take 1 tablet by mouth daily., Disp: , Rfl:    Omega-3 Fatty Acids (FISH OIL) 1200 MG CAPS, Take 1,200 mg by mouth every evening., Disp: , Rfl:    pantoprazole (PROTONIX) 40 MG tablet, Take 1 tablet by mouth every day, Disp: 30 tablet, Rfl: 11   rivaroxaban (XARELTO) 20 MG TABS tablet, Take 1 tablet (20 mg total) by mouth daily., Disp: 90 tablet, Rfl: 1   zolpidem (AMBIEN) 10 MG tablet, Take 1 tablet by mouth at bedtime as needed for sleep, Disp: 30 tablet, Rfl: 5  Past Medical History: Past Medical History:  Diagnosis Date   A-fib (HCC)    Acute deep vein thrombosis (DVT) of tibial vein of right lower extremity (HCC)    after ankle fracture/surgery (2/21)   Arthritis    RIGHT HIP, HANDS, BACK   Cancer (HCC)    Skin cancer    Cataract    Cervical radiculopathy    severe C3-4 neuroforaminal stenosis, C5-6 left synovial cyst compressing left nerve root.   Colon polyps    2005   COPD (chronic obstructive pulmonary disease) (HCC)    SMOKED FOR 52 YRS   Diverticulosis    2005   Fatty liver    GERD (gastroesophageal reflux disease)    Hard of hearing    History of kidney stones  Hypertension    Leg fracture, right    Pain    RIGHT HIP AND BACK   Personal history of colonic polyps-adenoma and sessile serrated polyp 07/15/2010   Pneumonia 03/2019   RBBB    POSS HX AF IN PAST- PAC'S   Shortness of breath    WITH EXERTION ONLY   Stroke (HCC)    TIA   TIA (transient ischemic attack)    Wears glasses     Tobacco Use: Social History   Tobacco Use  Smoking Status Former   Current packs/day: 0.00   Average packs/day: 2.0 packs/day for 57.0 years (114.0 ttl pk-yrs)   Types: Cigarettes   Start date: 05/24/1950   Quit date: 05/25/2007   Years since quitting: 15.5  Smokeless Tobacco Never  Tobacco Comments   Does not qualify due to age.     Labs: Review  Flowsheet  More data exists      Latest Ref Rng & Units 12/19/2014 12/14/2016 12/20/2017 01/26/2019 07/03/2022  Labs for ITP Cardiac and Pulmonary Rehab  Cholestrol <200 mg/dL 756  433  295  188  -  LDL (calc) mg/dL (calc) 416  606  301  601  -  HDL-C > OR = 40 mg/dL 52  56  47  46  -  Trlycerides <150 mg/dL 78  70  093  235  -  Hemoglobin A1c <5.7 % of total Hgb - - 6.1  6.0  -  Bicarbonate 20.0 - 28.0 mmol/L - - - - 26.0   TCO2 22 - 32 mmol/L - - - - 28   Acid-base deficit 0.0 - 2.0 mmol/L - - - - 2.0   O2 Saturation % - - - - 87  27.5     Details       Multiple values from one day are sorted in reverse-chronological order         Capillary Blood Glucose: Lab Results  Component Value Date   GLUCAP 101 (H) 12/03/2011   GLUCAP 122 (H) 12/02/2011   GLUCAP 106 (H) 12/02/2011   GLUCAP 105 (H) 12/02/2011   GLUCAP 107 (H) 12/02/2011     Pulmonary Assessment Scores:  Pulmonary Assessment Scores     Row Name 12/10/22 1001         ADL UCSD   ADL Phase Entry     SOB Score total 51       CAT Score   CAT Score 25       mMRC Score   mMRC Score 3             UCSD: Self-administered rating of dyspnea associated with activities of daily living (ADLs) 6-point scale (0 = "not at all" to 5 = "maximal or unable to do because of breathlessness")  Scoring Scores range from 0 to 120.  Minimally important difference is 5 units  CAT: CAT can identify the health impairment of COPD patients and is better correlated with disease progression.  CAT has a scoring range of zero to 40. The CAT score is classified into four groups of low (less than 10), medium (10 - 20), high (21-30) and very high (31-40) based on the impact level of disease on health status. A CAT score over 10 suggests significant symptoms.  A worsening CAT score could be explained by an exacerbation, poor medication adherence, poor inhaler technique, or progression of COPD or comorbid conditions.  CAT MCID is 2  points  mMRC: mMRC (Modified Medical Research Council) Dyspnea Scale  is used to assess the degree of baseline functional disability in patients of respiratory disease due to dyspnea. No minimal important difference is established. A decrease in score of 1 point or greater is considered a positive change.   Pulmonary Function Assessment:  Pulmonary Function Assessment - 12/10/22 0949       Breath   Bilateral Breath Sounds Rales;Basilar    Shortness of Breath Fear of Shortness of Breath;Limiting activity;Panic with Shortness of Breath;Yes             Exercise Target Goals: Exercise Program Goal: Individual exercise prescription set using results from initial 6 min walk test and THRR while considering  patient's activity barriers and safety.   Exercise Prescription Goal: Initial exercise prescription builds to 30-45 minutes a day of aerobic activity, 2-3 days per week.  Home exercise guidelines will be given to patient during program as part of exercise prescription that the participant will acknowledge.  Activity Barriers & Risk Stratification:  Activity Barriers & Cardiac Risk Stratification - 12/10/22 0939       Activity Barriers & Cardiac Risk Stratification   Activity Barriers Arthritis;Balance Concerns;Deconditioning;Muscular Weakness;History of Falls;Left Hip Replacement;Right Hip Replacement;Shortness of Breath;Back Problems             6 Minute Walk:  6 Minute Walk     Row Name 12/10/22 1101         6 Minute Walk   Phase Initial     Distance 920 feet     Walk Time 6 minutes     # of Rest Breaks 1     MPH 1.74     METS 1.19     RPE 11     Perceived Dyspnea  1     VO2 Peak 4.16     Resting HR 89 bpm     Resting BP 132/62     Resting Oxygen Saturation  89 %     Exercise Oxygen Saturation  during 6 min walk 86 %     Max Ex. HR 100 bpm     Max Ex. BP 160/80     2 Minute Post BP 140/80       Interval HR   1 Minute HR 76     2 Minute HR 85     3  Minute HR 93     4 Minute HR 95     5 Minute HR 100     6 Minute HR 97     2 Minute Post HR 77     Interval Heart Rate? Yes       Interval Oxygen   Interval Oxygen? Yes     Baseline Oxygen Saturation % 93 %     1 Minute Oxygen Saturation % 88 %     1 Minute Liters of Oxygen 0 L     2 Minute Oxygen Saturation % 86 %  applied 2L     2 Minute Liters of Oxygen 2 L     3 Minute Oxygen Saturation % 88 %     3 Minute Liters of Oxygen 2 L     4 Minute Oxygen Saturation % 87 %     4 Minute Liters of Oxygen 2 L     5 Minute Oxygen Saturation % 90 %     5 Minute Liters of Oxygen 2 L     6 Minute Oxygen Saturation % 91 %     6 Minute Liters of Oxygen 2 L  2 Minute Post Oxygen Saturation % 96 %     2 Minute Post Liters of Oxygen 2 L              Oxygen Initial Assessment:  Oxygen Initial Assessment - 12/10/22 0942       Home Oxygen   Home Oxygen Device Home Concentrator;E-Tanks    Sleep Oxygen Prescription None    Home Exercise Oxygen Prescription Continuous   pt wears as needed   Liters per minute 2    Home Resting Oxygen Prescription None    Compliance with Home Oxygen Use Yes      Initial 6 min Walk   Oxygen Used Continuous    Liters per minute 2      Program Oxygen Prescription   Program Oxygen Prescription Continuous    Liters per minute 2    Comments Pt needs 2L for exercise      Intervention   Short Term Goals To learn and exhibit compliance with exercise, home and travel O2 prescription;To learn and understand importance of monitoring SPO2 with pulse oximeter and demonstrate accurate use of the pulse oximeter.;To learn and understand importance of maintaining oxygen saturations>88%;To learn and demonstrate proper pursed lip breathing techniques or other breathing techniques. ;To learn and demonstrate proper use of respiratory medications    Long  Term Goals Exhibits compliance with exercise, home  and travel O2 prescription;Maintenance of O2  saturations>88%;Compliance with respiratory medication;Exhibits proper breathing techniques, such as pursed lip breathing or other method taught during program session;Verbalizes importance of monitoring SPO2 with pulse oximeter and return demonstration;Demonstrates proper use of MDI's             Oxygen Re-Evaluation:   Oxygen Discharge (Final Oxygen Re-Evaluation):   Initial Exercise Prescription:  Initial Exercise Prescription - 12/10/22 1200       Date of Initial Exercise RX and Referring Provider   Date 12/10/22    Referring Provider [name]    Expected Discharge Date 03/10/23      Oxygen   Oxygen Continuous    Liters 2    Maintain Oxygen Saturation 88% or higher      NuStep   Level 2    SPM 60    Minutes 15    METs 2      Track   Laps 12    Minutes 15    METs 1.5      Prescription Details   Frequency (times per week) 2    Duration Progress to 30 minutes of continuous aerobic without signs/symptoms of physical distress      Intensity   THRR 40-80% of Max Heartrate 54-109    Ratings of Perceived Exertion 11-13    Perceived Dyspnea 0-4      Progression   Progression Continue to progress workloads to maintain intensity without signs/symptoms of physical distress.      Resistance Training   Training Prescription Yes    Weight blue bands    Reps 10-15             Perform Capillary Blood Glucose checks as needed.  Exercise Prescription Changes:   Exercise Comments:   Exercise Goals and Review:   Exercise Goals     Row Name 12/10/22 0940             Exercise Goals   Increase Physical Activity Yes       Intervention Provide advice, education, support and counseling about physical activity/exercise needs.;Develop an individualized exercise prescription for aerobic and  resistive training based on initial evaluation findings, risk stratification, comorbidities and participant's personal goals.       Expected Outcomes Short Term: Attend  rehab on a regular basis to increase amount of physical activity.;Long Term: Add in home exercise to make exercise part of routine and to increase amount of physical activity.       Increase Strength and Stamina Yes       Intervention Provide advice, education, support and counseling about physical activity/exercise needs.;Develop an individualized exercise prescription for aerobic and resistive training based on initial evaluation findings, risk stratification, comorbidities and participant's personal goals.       Expected Outcomes Short Term: Perform resistance training exercises routinely during rehab and add in resistance training at home;Short Term: Increase workloads from initial exercise prescription for resistance, speed, and METs.;Long Term: Improve cardiorespiratory fitness, muscular endurance and strength as measured by increased METs and functional capacity ( )       Able to understand and use rate of perceived exertion (RPE) scale Yes       Intervention Provide education and explanation on how to use RPE scale       Expected Outcomes Short Term: Able to use RPE daily in rehab to express subjective intensity level;Long Term:  Able to use RPE to guide intensity level when exercising independently       Able to understand and use Dyspnea scale Yes       Intervention Provide education and explanation on how to use Dyspnea scale       Expected Outcomes Short Term: Able to use Dyspnea scale daily in rehab to express subjective sense of shortness of breath during exertion;Long Term: Able to use Dyspnea scale to guide intensity level when exercising independently       Knowledge and understanding of Target Heart Rate Range (THRR) Yes       Intervention Provide education and explanation of THRR including how the numbers were predicted and where they are located for reference       Expected Outcomes Short Term: Able to state/look up THRR;Long Term: Able to use THRR to govern intensity when  exercising independently;Short Term: Able to use daily as guideline for intensity in rehab       Understanding of Exercise Prescription Yes       Intervention Provide education, explanation, and written materials on patient's individual exercise prescription       Expected Outcomes Short Term: Able to explain program exercise prescription;Long Term: Able to explain home exercise prescription to exercise independently                Exercise Goals Re-Evaluation :   Discharge Exercise Prescription (Final Exercise Prescription Changes):   Nutrition:  Target Goals: Understanding of nutrition guidelines, daily intake of sodium 1500mg , cholesterol 200mg , calories 30% from fat and 7% or less from saturated fats, daily to have 5 or more servings of fruits and vegetables.  Biometrics:  Pre Biometrics - 12/10/22 1057       Pre Biometrics   Grip Strength 46 kg              Nutrition Therapy Plan and Nutrition Goals:   Nutrition Assessments:  MEDIFICTS Score Key: ?70 Need to make dietary changes  40-70 Heart Healthy Diet ? 40 Therapeutic Level Cholesterol Diet   Picture Your Plate Scores: <16 Unhealthy dietary pattern with much room for improvement. 41-50 Dietary pattern unlikely to meet recommendations for good health and room for improvement. 51-60 More healthful dietary  pattern, with some room for improvement.  >60 Healthy dietary pattern, although there may be some specific behaviors that could be improved.    Nutrition Goals Re-Evaluation:   Nutrition Goals Discharge (Final Nutrition Goals Re-Evaluation):   Psychosocial: Target Goals: Acknowledge presence or absence of significant depression and/or stress, maximize coping skills, provide positive support system. Participant is able to verbalize types and ability to use techniques and skills needed for reducing stress and depression.  Initial Review & Psychosocial Screening:  Initial Psych Review & Screening -  12/10/22 0932       Initial Review   Current issues with None Identified      Family Dynamics   Good Support System? Yes      Barriers   Psychosocial barriers to participate in program There are no identifiable barriers or psychosocial needs.             Quality of Life Scores:  Scores of 19 and below usually indicate a poorer quality of life in these areas.  A difference of  2-3 points is a clinically meaningful difference.  A difference of 2-3 points in the total score of the Quality of Life Index has been associated with significant improvement in overall quality of life, self-image, physical symptoms, and general health in studies assessing change in quality of life.  PHQ-9: Review Flowsheet  More data exists      12/10/2022 09/07/2022 06/22/2022 03/26/2021 03/11/2020  Depression screen PHQ 2/9  Decreased Interest 0 0 0 0 0  Down, Depressed, Hopeless 0 0 0 0 0  PHQ - 2 Score 0 0 0 0 0  Altered sleeping 0 - 3 - -  Tired, decreased energy 1 - 3 - -  Change in appetite 1 - 3 - -  Feeling bad or failure about yourself  0 - 0 - -  Trouble concentrating 0 - 0 - -  Moving slowly or fidgety/restless 0 - 0 - -  Suicidal thoughts 0 - 0 - -  PHQ-9 Score 2 - 9 - -  Difficult doing work/chores Not difficult at all - Somewhat difficult - -    Details           Interpretation of Total Score  Total Score Depression Severity:  1-4 = Minimal depression, 5-9 = Mild depression, 10-14 = Moderate depression, 15-19 = Moderately severe depression, 20-27 = Severe depression   Psychosocial Evaluation and Intervention:  Psychosocial Evaluation - 12/10/22 0933       Psychosocial Evaluation & Interventions   Interventions Encouraged to exercise with the program and follow exercise prescription    Comments Tramane denies any psychosocial barriers or concerns at this time    Expected Outcomes For [place] to participate in PR free of any psychosocial barriers or concerns    Continue  Psychosocial Services  No Follow up required             Psychosocial Re-Evaluation:   Psychosocial Discharge (Final Psychosocial Re-Evaluation):   Education: Education Goals: Education classes will be provided on a weekly basis, covering required topics. Participant will state understanding/return demonstration of topics presented.  Learning Barriers/Preferences:  Learning Barriers/Preferences - 12/10/22 0934       Learning Barriers/Preferences   Learning Barriers Sight;Hearing    Learning Preferences Group Instruction;Individual Instruction             Education Topics: Introduction to Pulmonary Rehab Group instruction provided by PowerPoint, verbal discussion, and written material to support subject matter. Instructor reviews what Pulmonary  Rehab is, the purpose of the program, and how patients are referred.     Know Your Numbers Group instruction that is supported by a PowerPoint presentation. Instructor discusses importance of knowing and understanding resting, exercise, and post-exercise oxygen saturation, heart rate, and blood pressure. Oxygen saturation, heart rate, blood pressure, rating of perceived exertion, and dyspnea are reviewed along with a normal range for these values.    Exercise for the Pulmonary Patient Group instruction that is supported by a PowerPoint presentation. Instructor discusses benefits of exercise, core components of exercise, frequency, duration, and intensity of an exercise routine, importance of utilizing pulse oximetry during exercise, safety while exercising, and options of places to exercise outside of rehab.       MET Level  Group instruction provided by PowerPoint, verbal discussion, and written material to support subject matter. Instructor reviews what METs are and how to increase METs.    Pulmonary Medications Verbally interactive group education provided by instructor with focus on inhaled medications and proper  administration.   Anatomy and Physiology of the Respiratory System Group instruction provided by PowerPoint, verbal discussion, and written material to support subject matter. Instructor reviews respiratory cycle and anatomical components of the respiratory system and their functions. Instructor also reviews differences in obstructive and restrictive respiratory diseases with examples of each.    Oxygen Safety Group instruction provided by PowerPoint, verbal discussion, and written material to support subject matter. There is an overview of "What is Oxygen" and "Why do we need it".  Instructor also reviews how to create a safe environment for oxygen use, the importance of using oxygen as prescribed, and the risks of noncompliance. There is a brief discussion on traveling with oxygen and resources the patient may utilize.   Oxygen Use Group instruction provided by PowerPoint, verbal discussion, and written material to discuss how supplemental oxygen is prescribed and different types of oxygen supply systems. Resources for more information are provided.    Breathing Techniques Group instruction that is supported by demonstration and informational handouts. Instructor discusses the benefits of pursed lip and diaphragmatic breathing and detailed demonstration on how to perform both.     Risk Factor Reduction Group instruction that is supported by a PowerPoint presentation. Instructor discusses the definition of a risk factor, different risk factors for pulmonary disease, and how the heart and lungs work together.   MD Day A group question and answer session with a medical doctor that allows participants to ask questions that relate to their pulmonary disease state.   Nutrition for the Pulmonary Patient Group instruction provided by PowerPoint slides, verbal discussion, and written materials to support subject matter. The instructor gives an explanation and review of healthy diet  recommendations, which includes a discussion on weight management, recommendations for fruit and vegetable consumption, as well as protein, fluid, caffeine, fiber, sodium, sugar, and alcohol. Tips for eating when patients are short of breath are discussed.    Other Education Group or individual verbal, written, or video instructions that support the educational goals of the pulmonary rehab program.    Knowledge Questionnaire Score:  Knowledge Questionnaire Score - 12/10/22 1001       Knowledge Questionnaire Score   Pre Score 15/18             Core Components/Risk Factors/Patient Goals at Admission:  Personal Goals and Risk Factors at Admission - 12/10/22 0935       Core Components/Risk Factors/Patient Goals on Admission    Weight Management Yes;Weight Loss  Intervention Weight Management: Develop a combined nutrition and exercise program designed to reach desired caloric intake, while maintaining appropriate intake of nutrient and fiber, sodium and fats, and appropriate energy expenditure required for the weight goal.;Weight Management: Provide education and appropriate resources to help participant work on and attain dietary goals.;Weight Management/Obesity: Establish reasonable short term and long term weight goals.;Obesity: Provide education and appropriate resources to help participant work on and attain dietary goals.    Expected Outcomes Short Term: Continue to assess and modify interventions until short term weight is achieved;Long Term: Adherence to nutrition and physical activity/exercise program aimed toward attainment of established weight goal;Weight Maintenance: Understanding of the daily nutrition guidelines, which includes 25-35% calories from fat, 7% or less cal from saturated fats, less than 200mg  cholesterol, less than 1.5gm of sodium, & 5 or more servings of fruits and vegetables daily;Weight Loss: Understanding of general recommendations for a balanced deficit meal  plan, which promotes 1-2 lb weight loss per week and includes a negative energy balance of [phone] kcal/d;Understanding recommendations for meals to include 15-35% energy as protein, 25-35% energy from fat, 35-60% energy from carbohydrates, less than 200mg  of dietary cholesterol, 20-35 gm of total fiber daily;Understanding of distribution of calorie intake throughout the day with the consumption of 4-5 meals/snacks    Improve shortness of breath with ADL's Yes    Intervention Provide education, individualized exercise plan and daily activity instruction to help decrease symptoms of SOB with activities of daily living.    Expected Outcomes Short Term: Improve cardiorespiratory fitness to achieve a reduction of symptoms when performing ADLs;Long Term: Be able to perform more ADLs without symptoms or delay the onset of symptoms    Increase knowledge of respiratory medications and ability to use respiratory devices properly  Yes    Expected Outcomes Long Term: Maintain appropriate use of medications, inhalers, and oxygen therapy.;Short Term: Achieves understanding of medications use. Understands that oxygen is a medication prescribed by physician. Demonstrates appropriate use of inhaler and oxygen therapy.             Core Components/Risk Factors/Patient Goals Review:    Core Components/Risk Factors/Patient Goals at Discharge (Final Review):    ITP Comments:   Comments: Dr. Mechele Collin is Medical Director for Pulmonary Rehab at [facility].

## 2022-12-10 NOTE — Progress Notes (Signed)
Brendan Hopkins 87 y.o. male Pulmonary Rehab Orientation Note This patient who was referred to Pulmonary Rehab by Dr. [name] with the diagnosis of SOB/post COVID 19 arrived today in Cardiac and Pulmonary Rehab. He arrived ambulatory with limp gait. He does not carry portable oxygen. [employer] is the provider for their DME. Per patient, Brendan Hopkins uses oxygen intermittently. Color good, skin warm and dry. Patient is oriented to time and place. Patient's medical history, psychosocial health, and medications reviewed. Psychosocial assessment reveals patient lives with alone. Brendan Hopkins is currently retired. 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 Patient reports his stress level is low. Areas of stress/anxiety include health. Patient does not exhibit signs of depression.  PHQ2/9 score 0/2. Brendan Hopkins shows good  coping skills with positive outlook on life. Offered emotional support and reassurance. Will continue to monitor. Physical assessment performed by Nurse pick: Karlene Lineman RN. Please see their orientation physical assessment note. Brendan Hopkins reports he  does take medications as prescribed. Patient states he  follows a regular  diet. The patient reports no specific efforts to gain or lose weight.. Patient's weight will be monitored closely. Demonstration and practice of PLB using pulse oximeter. Brendan Hopkins able to return demonstration satisfactorily. Safety and hand hygiene in the exercise area reviewed with patient. Brendan Hopkins voices understanding of the information reviewed. Department expectations discussed with patient and achievable goals were set. The patient shows enthusiasm about attending the program and we look forward to working with Brendan Hopkins. Brendan Hopkins completed a 6 min walk test today and is scheduled to begin exercise on 7/25@10 :15.   [id number] Guss Bunde, BSRT

## 2022-12-12 NOTE — Progress Notes (Signed)
Pulmonary Rehab Orientation Physical Assessment Note  Physical assessment reveals  Pt is alert and oriented x 3.  Heart rate is  irregular, pt with history of afib , breath sounds per Durel Salts RT. Bowel sounds present.  Pt denies abdominal discomfort, nausea, vomiting or diarrhea. Grip strength equal, strong. Distal pulses palpable; 1+ swelling to lower extremities, pt on diuretic therapy and admits to dietary indiscretions.Pt unable to wear compression socks advised to elevate legs when sitting. Alanson Aly, BSN Cardiac and Emergency planning/management officer

## 2022-12-13 ENCOUNTER — Ambulatory Visit (HOSPITAL_COMMUNITY)
Admission: RE | Admit: 2022-12-13 | Discharge: 2022-12-13 | Disposition: A | Discharge status: Home / Self Care | Payer: Medicare Other | Source: Ambulatory Visit | Attending: Pulmonary Disease | Admitting: Pulmonary Disease

## 2022-12-13 DIAGNOSIS — J449 Chronic obstructive pulmonary disease, unspecified: Secondary | ICD-10-CM | POA: Diagnosis not present

## 2022-12-13 MED ORDER — ALBUTEROL SULFATE (2.5 MG/3ML) 0.083% IN NEBU
2.5000 mg | INHALATION_SOLUTION | Freq: Once | RESPIRATORY_TRACT | Status: AC
  Administered 2022-12-13: 2.5 mg via RESPIRATORY_TRACT

## 2022-12-14 ENCOUNTER — Telehealth (HOSPITAL_BASED_OUTPATIENT_CLINIC_OR_DEPARTMENT_OTHER): Payer: Self-pay | Admitting: Pulmonary Disease

## 2022-12-14 NOTE — Telephone Encounter (Signed)
Received communication from Pulmonary rehab team on 12/10/22:  "Brendan Hopkins came in for his pulmonary rehab orientation and had to use 2L of O2 during his walk test. Looks like he was discharged from the hospital on oxygen, but only has a concentrator and a couple of E cylinders for back up. Could you put in an order for a POC fro him? His DME is [employer]. "  Since patient is new start, he will need a qualifying walk before prescription can be sent.   As of 12/14/22 staff has "attempted 2x to contact patient. Patients voicemail is not set up. Will attempt a third time. First available time is 9/6 for him to be seen for walk & f/u "

## 2022-12-15 LAB — PULMONARY FUNCTION TEST
DL/VA % pred: 60 %
DL/VA: 2.35 ml/min/mmHg/L
DLCO unc % pred: 44 %
DLCO unc: 9.9 ml/min/mmHg
FEF 25-75 Post: 1.77 L/sec
FEF 25-75 Pre: 1.62 L/sec
FEF2575-%Pred-Post: 111 %
FEV1-%Change-Post: 1 %
FEV1-%Pred-Post: 85 %
FEV1-%Pred-Pre: 84 %
FEV1-Post: 2.1 L
FEV1-Pre: 2.08 L
FEV1FVC-%Pred-Pre: 107 %
FEV6-%Pred-Post: 85 %
FEV6-Post: 2.79 L
FEV6-Pre: 2.73 L
FEV6FVC-%Change-Post: 0 %
FEV6FVC-%Pred-Post: 107 %
FVC-%Change-Post: 3 %
FVC-%Pred-Post: 80 %
FVC-%Pred-Pre: 77 %
FVC-Post: 2.82 L
FVC-Pre: 2.73 L
Post FEV1/FVC ratio: 75 %
Post FEV6/FVC ratio: 99 %
Pre FEV6/FVC Ratio: 100 %
RV % pred: 58 %
RV: 1.55 L
TLC % pred: 72 %
TLC: 4.81 L

## 2022-12-16 ENCOUNTER — Encounter (HOSPITAL_COMMUNITY)
Admission: RE | Admit: 2022-12-16 | Discharge: 2022-12-16 | Disposition: A | Discharge status: Home / Self Care | Payer: Medicare Other | Source: Ambulatory Visit | Attending: Pulmonary Disease | Admitting: Pulmonary Disease

## 2022-12-16 DIAGNOSIS — R0602 Shortness of breath: Secondary | ICD-10-CM

## 2022-12-16 DIAGNOSIS — U099 Post covid-19 condition, unspecified: Secondary | ICD-10-CM

## 2022-12-21 ENCOUNTER — Encounter (HOSPITAL_COMMUNITY)
Admission: RE | Admit: 2022-12-21 | Discharge: 2022-12-21 | Disposition: A | Discharge status: Home / Self Care | Payer: Medicare Other | Source: Ambulatory Visit | Attending: Pulmonary Disease | Admitting: Pulmonary Disease

## 2022-12-21 DIAGNOSIS — R0602 Shortness of breath: Secondary | ICD-10-CM

## 2022-12-21 DIAGNOSIS — U099 Post covid-19 condition, unspecified: Secondary | ICD-10-CM

## 2022-12-21 NOTE — Progress Notes (Signed)
Daily Session Note  Patient Details  Name: Brendan Hopkins MRN: [id number] Date of Birth: Apr 28, 1936 Referring Provider:   Doristine Devoid Pulmonary Rehab Walk Test from 12/10/2022 in [facility] for Heart, Vascular, & Lung Health  Referring Provider [name]       Encounter Date: 12/21/2022  Check In:  Session Check In - 12/21/22 1213       Check-In   Supervising physician immediately available to respond to emergencies [facility] MD immediately available    Physician(s) Robin Searing, NP    Location [facility]    Staff Present Elissa Lovett BS, ACSM-CEP, Exercise Physiologist;Samantha Belarus, RD, Dutch Gray, RN, BSN;Carlette Carlton, RN, Fuller Plan, RT    Virtual Visit No    Medication changes reported     No    Fall or balance concerns reported    Yes    Comments pt does not have good balance    Tobacco Cessation No Change    Warm-up and Cool-down Performed as group-led instruction    Resistance Training Performed Yes    VAD Patient? No    PAD/SET Patient? No      Pain Assessment   Currently in Pain? No/denies    Multiple Pain Sites No             Capillary Blood Glucose: No results found for this or any previous visit (from the past 24 hour(s)).    Social History   Tobacco Use  Smoking Status Former   Current packs/day: 0.00   Average packs/day: 2.0 packs/day for 57.0 years (114.0 ttl pk-yrs)   Types: Cigarettes   Start date: 05/24/1950   Quit date: 05/25/2007   Years since quitting: 15.5  Smokeless Tobacco Never  Tobacco Comments   Does not qualify due to age.     Goals Met:  Proper associated with RPD/PD & O2 Sat Independence with exercise equipment Exercise tolerated well No report of concerns or symptoms today Strength training completed today  Goals Unmet:  Not Applicable  Comments: Service time is from 1007 to 1146.    Dr. Mechele Collin is Medical Director for Pulmonary Rehab at [facility].

## 2022-12-23 ENCOUNTER — Encounter (HOSPITAL_COMMUNITY)
Admission: RE | Admit: 2022-12-23 | Discharge: 2022-12-23 | Disposition: A | Discharge status: Home / Self Care | Payer: Medicare Other | Source: Ambulatory Visit | Attending: Pulmonary Disease | Admitting: Pulmonary Disease

## 2022-12-23 DIAGNOSIS — U099 Post covid-19 condition, unspecified: Secondary | ICD-10-CM | POA: Insufficient documentation

## 2022-12-23 DIAGNOSIS — R0602 Shortness of breath: Secondary | ICD-10-CM | POA: Diagnosis not present

## 2022-12-23 NOTE — Telephone Encounter (Signed)
Patient is returning a call.  He stated he had some issues with his phone.  Please call patient back to discuss.  CB# [phone]

## 2022-12-23 NOTE — Telephone Encounter (Addendum)
Patient reports he has an Inogen device and has no further needs. Currently participating in pulmonary rehab No further needs.

## 2022-12-23 NOTE — Progress Notes (Signed)
Daily Session Note  Patient Details  Name: Brendan Hopkins MRN: [id number] Date of Birth: 09-06-1935 Referring Provider:   Doristine Devoid Pulmonary Rehab Walk Test from 12/10/2022 in [facility] for Heart, Vascular, & Lung Health  Referring Provider [name]       Encounter Date: 12/23/2022  Check In:  Session Check In - 12/23/22 1145       Check-In   Supervising physician immediately available to respond to emergencies [facility] MD immediately available    Physician(s) Carlyon Shadow, NP    Location [facility]    Staff Present Samantha Belarus, RD, LDN;Dorothia Passmore Dionisio Paschal, ACSM-CEP, Exercise Physiologist;Mary Gerre Scull, RN, BSN;Casey Smith, RT;Jetta Walker BS, ACSM-CEP, Exercise Physiologist    Virtual Visit No    Medication changes reported     No    Fall or balance concerns reported    Yes    Comments pt does not have good balance    Tobacco Cessation No Change    Warm-up and Cool-down Performed as group-led instruction    Resistance Training Performed Yes    VAD Patient? No    PAD/SET Patient? No      Pain Assessment   Currently in Pain? No/denies    Multiple Pain Sites No             Capillary Blood Glucose: No results found for this or any previous visit (from the past 24 hour(s)).    Social History   Tobacco Use  Smoking Status Former   Current packs/day: 0.00   Average packs/day: 2.0 packs/day for 57.0 years (114.0 ttl pk-yrs)   Types: Cigarettes   Start date: 05/24/1950   Quit date: 05/25/2007   Years since quitting: 15.5  Smokeless Tobacco Never  Tobacco Comments   Does not qualify due to age.     Goals Met:  Exercise tolerated well No report of concerns or symptoms today Strength training completed today  Goals Unmet:  Not Applicable  Comments: Service time is from 1005 to 1150.    Dr. Mechele Collin is Medical Director for Pulmonary Rehab at [facility].

## 2022-12-27 ENCOUNTER — Telehealth: Payer: Self-pay | Admitting: Pharmacy Technician

## 2022-12-27 ENCOUNTER — Telehealth: Payer: Self-pay | Admitting: Family Medicine

## 2022-12-27 DIAGNOSIS — Z5986 Financial insecurity: Secondary | ICD-10-CM

## 2022-12-27 NOTE — Telephone Encounter (Signed)
Patient called to request samples of rivaroxaban (XARELTO) 20 MG TABS tablet   Four bottles dispensed (7 days each).   Patient will pick up samples at front desk today before lunch.

## 2022-12-27 NOTE — Patient Outreach (Signed)
Received a pharmacy referral from Percival Spanish at [facility].   I have sent this referral to the [facility] Pharmacy Team through [employer].    Iverson Alamin, Donivan Scull [facility] Care Management Assistant [facility] Care Management [phone]

## 2022-12-27 NOTE — Progress Notes (Signed)
[facility] Customer service manager [facility])  [facility] Pharmacy Team   12/27/2022  AZAVION BROMMER Oct 10, 1935 [id number]  Reason for referral: Medication Assistance for Eliquis Referral source: Autumn Messing at [facility] Current insurance: [facility]  Outreach:  Successful telephone call with patient.  HIPAA identifiers verified. Patient informs he is a household of 1 and has [employer] MA as his insurance. Based on reported income, patient appears to qualify income wise with [employer]MS for Eliquis and [employer] for Trelegy. Patient is not sure if he has met the out of pocket guidelines needed for the programs. He informs he has his medications filled at [facility] and would be willing to go and get a pharmacy printout for 2024. Based on this information, patient will be mailed the 2 patient assistance applications.   Medication Assistance Findings:  Medication assistance needs identified: Eliquis with BMS and Trelegy with [employer]  Extra Help:  Not eligible for Extra Help Low Income Subsidy based on reported income and assets  Additional medication assistance options reviewed with patient as warranted:  No other options identified  Plan: I will route patient assistance letter to [facility] pharmacy technician who will coordinate patient assistance program application process for medications listed above.  [facility] pharmacy technician will assist with obtaining all required documents from both patient and provider(s) and submit application(s) once completed.  Thank you for allowing [facility] pharmacy to be a part of this patient's care.   [name], CPhT [place]  [facility] Office: [phone] Fax: [phone] Email: [email or web address]

## 2022-12-28 ENCOUNTER — Encounter (HOSPITAL_COMMUNITY): Admission: RE | Admit: 2022-12-28 | Payer: Medicare Other | Source: Ambulatory Visit

## 2022-12-28 VITALS — Wt 246.5 lb

## 2022-12-28 DIAGNOSIS — R0602 Shortness of breath: Secondary | ICD-10-CM | POA: Diagnosis not present

## 2022-12-28 DIAGNOSIS — U099 Post covid-19 condition, unspecified: Secondary | ICD-10-CM | POA: Diagnosis not present

## 2022-12-28 NOTE — Progress Notes (Signed)
Daily Session Note  Patient Details  Name: Brendan Hopkins MRN: [id number] Date of Birth: 1935/10/12 Referring Provider:   Doristine Devoid Pulmonary Rehab Walk Test from 12/10/2022 in [facility] for Heart, Vascular, & Lung Health  Referring Provider [name]       Encounter Date: 12/28/2022  Check In:   Capillary Blood Glucose: No results found for this or any previous visit (from the past 24 hour(s)).   Exercise Prescription Changes - 12/28/22 1200       Response to Exercise   Blood Pressure (Admit) 118/64    Blood Pressure (Exercise) 150/70    Blood Pressure (Exit) 110/62    Heart Rate (Admit) 76 bpm    Heart Rate (Exercise) 96 bpm    Heart Rate (Exit) 79 bpm    Oxygen Saturation (Admit) 93 %    Oxygen Saturation (Exercise) 92 %    Oxygen Saturation (Exit) 94 %    Rating of Perceived Exertion (Exercise) 15    Perceived Dyspnea (Exercise) 2    Duration Continue with 30 min of aerobic exercise without signs/symptoms of physical distress.    Intensity THRR unchanged      Progression   Progression Continue to progress workloads to maintain intensity without signs/symptoms of physical distress.      Resistance Training   Training Prescription Yes    Weight blue bands    Reps 10-15    Time 10 Minutes      Oxygen   Oxygen Continuous    Liters 2      NuStep   Level 1    Minutes 15    METs 2.1      Track   Laps 10    Minutes 15    METs 2.54      Oxygen   Maintain Oxygen Saturation 88% or higher             Social History   Tobacco Use  Smoking Status Former   Current packs/day: 0.00   Average packs/day: 2.0 packs/day for 57.0 years (114.0 ttl pk-yrs)   Types: Cigarettes   Start date: 05/24/1950   Quit date: 05/25/2007   Years since quitting: 15.6  Smokeless Tobacco Never  Tobacco Comments   Does not qualify due to age.     Goals Met:  Exercise tolerated well No report of concerns or symptoms today Strength training  completed today  Goals Unmet:  Not Applicable  Comments: Service time is from 1009 to 1130     Dr. Mechele Collin is Medical Director for Pulmonary Rehab at [facility].

## 2022-12-29 ENCOUNTER — Other Ambulatory Visit (HOSPITAL_BASED_OUTPATIENT_CLINIC_OR_DEPARTMENT_OTHER): Payer: Self-pay

## 2022-12-29 MED ORDER — TRELEGY ELLIPTA 200-62.5-25 MCG/ACT IN AEPB: 1.0000 | INHALATION_SPRAY | Freq: Every day | RESPIRATORY_TRACT | 5 refills | Status: DC

## 2022-12-29 NOTE — Progress Notes (Signed)
Pulmonary Individual Treatment Plan  Patient Details  Name: Brendan Hopkins MRN: [id number] Date of Birth: [birth year] Referring Provider:   Doristine Devoid Pulmonary Rehab Walk Test from 12/10/2022 in [facility]  Referring Provider [name]       Initial Encounter Date:  Flowsheet Row Pulmonary Rehab Walk Test from 12/10/2022 in [facility], [facility] Lung [facility]  Date 12/10/22       Visit Diagnosis: Shortness of breath  Post covid-19 condition, unspecified  Patient's Home Medications on Admission:   Current Outpatient Medications:    acetaminophen (TYLENOL) 325 MG tablet, Take 325 mg by mouth every evening., Disp: , Rfl:    albuterol (VENTOLIN HFA) 108 (90 Base) MCG/ACT inhaler, Inhale 1 to 2  puffs by mouth every 6 hours as needed for shortness of breath (Patient taking differently: Inhale 1 puff into the lungs every 6 (six) hours as needed for shortness of breath.), Disp: 6.7 each, Rfl: 11   amLODipine (NORVASC) 5 MG tablet, Take 1 tablet by mouth at bedtime, Disp: 30 tablet, Rfl: 2   aspirin 81 MG chewable tablet, Chew 81 mg by mouth at bedtime., Disp: , Rfl:    benzonatate (TESSALON) 200 MG capsule, Take 1 capsule (200 mg total) by mouth 3 (three) times daily as needed for cough., Disp: 20 capsule, Rfl: 0   Fluticasone-Umeclidin-Vilant (TRELEGY ELLIPTA) 200-62.5-25 MCG/ACT AEPB, Inhale 1 puff into the lungs daily., Disp: 60 each, Rfl: 5   furosemide (LASIX) 40 MG tablet, Take 1 tablet by mouth once daily, Disp: 30 tablet, Rfl: 11   gabapentin (NEURONTIN) 300 MG capsule, TAKE 1 CAPSULE BY MOUTH THREE TIMES DAILY AS NEEDED FOR  SCIATICA (Patient taking differently: Take 300 mg by mouth 2 (two) times daily.), Disp: 270 capsule, Rfl: 2   Iron, Ferrous Sulfate, 325 (65 Fe) MG TABS, Take 325 mg by mouth daily., Disp: 30 tablet, Rfl: 1   metoprolol succinate (TOPROL-XL) 25 MG 24 hr tablet,  Take 1 tablet (25 mg total) by mouth daily., Disp: 90 tablet, Rfl: 3   Multiple Vitamin (MULTIVITAMIN WITH MINERALS) TABS, Take 1 tablet by mouth daily., Disp: , Rfl:    Omega-3 Fatty Acids (FISH OIL) 1200 MG CAPS, Take 1,200 mg by mouth every evening., Disp: , Rfl:    pantoprazole (PROTONIX) 40 MG tablet, Take 1 tablet by mouth every day, Disp: 30 tablet, Rfl: 11   rivaroxaban (XARELTO) 20 MG TABS tablet, Take 1 tablet (20 mg total) by mouth daily., Disp: 90 tablet, Rfl: 1   zolpidem (AMBIEN) 10 MG tablet, Take 1 tablet by mouth at bedtime as needed for sleep, Disp: 30 tablet, Rfl: 5  Past Medical History: Past Medical History:  Diagnosis Date   A-fib (HCC)    Acute deep vein thrombosis (DVT) of tibial vein of right lower extremity (HCC)    after ankle fracture/surgery (2/21)   Arthritis    RIGHT HIP, HANDS, BACK   Cancer (HCC)    Skin cancer    Cataract    Cervical radiculopathy    severe C3-4 neuroforaminal stenosis, C5-6 left synovial cyst compressing left nerve root.   Colon polyps    2005   COPD (chronic obstructive pulmonary disease) (HCC)    SMOKED FOR 41 YRS   Diverticulosis    2005   Fatty liver    GERD (gastroesophageal reflux disease)    Hard of hearing    History of kidney stones  Hypertension    Leg fracture, right    Pain    RIGHT HIP AND BACK   Personal history of colonic polyps-adenoma and sessile serrated polyp 07/15/2010   Pneumonia 03/2019   RBBB    POSS HX AF IN PAST- PAC'S   Shortness of breath    WITH EXERTION ONLY   Stroke (HCC)    TIA   TIA (transient ischemic attack)    Wears glasses     Tobacco Use: Social History   Tobacco Use  Smoking Status Former   Current packs/day: 0.00   Average packs/day: 2.0 packs/day for 57.0 years (114.0 ttl pk-yrs)   Types: Cigarettes   Start date: 05/24/1950   Quit date: 05/25/2007   Years since quitting: 15.6  Smokeless Tobacco Never  Tobacco Comments   Does not qualify due to age.     Labs: Review  Flowsheet  More data exists      Latest Ref Rng & Units 12/19/2014 12/14/2016 12/20/2017 01/26/2019 07/03/2022  Labs for ITP Cardiac and Pulmonary Rehab  Cholestrol <200 mg/dL 660  630  160  109  -  LDL (calc) mg/dL (calc) 323  557  322  025  -  HDL-C > OR = 40 mg/dL 52  56  47  46  -  Trlycerides <150 mg/dL 78  70  427  062  -  Hemoglobin A1c <5.7 % of total Hgb - - 6.1  6.0  -  Bicarbonate 20.0 - 28.0 mmol/L - - - - 26.0   TCO2 22 - 32 mmol/L - - - - 28   Acid-base deficit 0.0 - 2.0 mmol/L - - - - 2.0   O2 Saturation % - - - - 87  27.5     Details       Multiple values from one day are sorted in reverse-chronological order         Capillary Blood Glucose: Lab Results  Component Value Date   GLUCAP 101 (H) 12/03/2011   GLUCAP 122 (H) 12/02/2011   GLUCAP 106 (H) 12/02/2011   GLUCAP 105 (H) 12/02/2011   GLUCAP 107 (H) 12/02/2011     Pulmonary Assessment Scores:  Pulmonary Assessment Scores     Row Name 12/10/22 1001         ADL UCSD   ADL Phase Entry     SOB Score total 51       CAT Score   CAT Score 25       mMRC Score   mMRC Score 3             UCSD: Self-administered rating of dyspnea associated with activities of daily living (ADLs) 6-point scale (0 = "not at all" to 5 = "maximal or unable to do because of breathlessness")  Scoring Scores range from 0 to 120.  Minimally important difference is 5 units  CAT: CAT can identify the health impairment of COPD patients and is better correlated with disease progression.  CAT has a scoring range of zero to 40. The CAT score is classified into four groups of low (less than 10), medium (10 - 20), high (21-30) and very high (31-40) based on the impact level of disease on health status. A CAT score over 10 suggests significant symptoms.  A worsening CAT score could be explained by an exacerbation, poor medication adherence, poor inhaler technique, or progression of COPD or comorbid conditions.  CAT MCID is 2  points  mMRC: mMRC (Modified Medical Research Council) Dyspnea Scale  is used to assess the degree of baseline functional disability in patients of respiratory disease due to dyspnea. No minimal important difference is established. A decrease in score of 1 point or greater is considered a positive change.   Pulmonary Function Assessment:  Pulmonary Function Assessment - 12/10/22 0949       Breath   Bilateral Breath Sounds Rales;Basilar    Shortness of Breath Fear of Shortness of Breath;Limiting activity;Panic with Shortness of Breath;Yes             Exercise Target Goals: Exercise Program Goal: Individual exercise prescription set using results from initial 6 min walk test and THRR while considering  patient's activity barriers and safety.   Exercise Prescription Goal: Initial exercise prescription builds to 30-45 minutes a day of aerobic activity, 2-3 days per week.  Home exercise guidelines will be given to patient during program as part of exercise prescription that the participant will acknowledge.  Activity Barriers & Risk Stratification:  Activity Barriers & Cardiac Risk Stratification - 12/10/22 0939       Activity Barriers & Cardiac Risk Stratification   Activity Barriers Arthritis;Balance Concerns;Deconditioning;Muscular Weakness;History of Falls;Left Hip Replacement;Right Hip Replacement;Shortness of Breath;Back Problems             6 Minute Walk:  6 Minute Walk     Row Name 12/10/22 1101         6 Minute Walk   Phase Initial     Distance 920 feet     Walk Time 6 minutes     # of Rest Breaks 1     MPH 1.74     METS 1.19     RPE 11     Perceived Dyspnea  1     VO2 Peak 4.16     Resting HR 89 bpm     Resting BP 132/62     Resting Oxygen Saturation  89 %     Exercise Oxygen Saturation  during 6 min walk 86 %     Max Ex. HR 100 bpm     Max Ex. BP 160/80     2 Minute Post BP 140/80       Interval HR   1 Minute HR 76     2 Minute HR 85     3  Minute HR 93     4 Minute HR 95     5 Minute HR 100     6 Minute HR 97     2 Minute Post HR 77     Interval Heart Rate? Yes       Interval Oxygen   Interval Oxygen? Yes     Baseline Oxygen Saturation % 93 %     1 Minute Oxygen Saturation % 88 %     1 Minute Liters of Oxygen 0 L     2 Minute Oxygen Saturation % 86 %  applied 2L     2 Minute Liters of Oxygen 2 L     3 Minute Oxygen Saturation % 88 %     3 Minute Liters of Oxygen 2 L     4 Minute Oxygen Saturation % 87 %     4 Minute Liters of Oxygen 2 L     5 Minute Oxygen Saturation % 90 %     5 Minute Liters of Oxygen 2 L     6 Minute Oxygen Saturation % 91 %     6 Minute Liters of Oxygen 2 L  2 Minute Post Oxygen Saturation % 96 %     2 Minute Post Liters of Oxygen 2 L              Oxygen Initial Assessment:  Oxygen Initial Assessment - 12/10/22 0942       Home Oxygen   Home Oxygen Device Home Concentrator;E-Tanks    Sleep Oxygen Prescription None    Home Exercise Oxygen Prescription Continuous   pt wears as needed   Liters per minute 2    Home Resting Oxygen Prescription None    Compliance with Home Oxygen Use Yes      Initial 6 min Walk   Oxygen Used Continuous    Liters per minute 2      Program Oxygen Prescription   Program Oxygen Prescription Continuous    Liters per minute 2    Comments Pt needs 2L for exercise      Intervention   Short Term Goals To learn and exhibit compliance with exercise, home and travel O2 prescription;To learn and understand importance of monitoring SPO2 with pulse oximeter and demonstrate accurate use of the pulse oximeter.;To learn and understand importance of maintaining oxygen saturations>88%;To learn and demonstrate proper pursed lip breathing techniques or other breathing techniques. ;To learn and demonstrate proper use of respiratory medications    Long  Term Goals Exhibits compliance with exercise, home  and travel O2 prescription;Maintenance of O2  saturations>88%;Compliance with respiratory medication;Exhibits proper breathing techniques, such as pursed lip breathing or other method taught during program session;Verbalizes importance of monitoring SPO2 with pulse oximeter and return demonstration;Demonstrates proper use of MDI's             Oxygen Re-Evaluation:  Oxygen Re-Evaluation     Row Name 12/17/22 1133             Program Oxygen Prescription   Program Oxygen Prescription Continuous       Liters per minute 2         Home Oxygen   Home Oxygen Device Home Concentrator;E-Tanks       Sleep Oxygen Prescription None       Home Exercise Oxygen Prescription Continuous  pt wears as needed       Liters per minute 2       Home Resting Oxygen Prescription None       Compliance with Home Oxygen Use Yes         Goals/Expected Outcomes   Short Term Goals To learn and exhibit compliance with exercise, home and travel O2 prescription;To learn and understand importance of monitoring SPO2 with pulse oximeter and demonstrate accurate use of the pulse oximeter.;To learn and understand importance of maintaining oxygen saturations>88%;To learn and demonstrate proper pursed lip breathing techniques or other breathing techniques. ;To learn and demonstrate proper use of respiratory medications       Long  Term Goals Exhibits compliance with exercise, home  and travel O2 prescription;Maintenance of O2 saturations>88%;Compliance with respiratory medication;Exhibits proper breathing techniques, such as pursed lip breathing or other method taught during program session;Verbalizes importance of monitoring SPO2 with pulse oximeter and return demonstration;Demonstrates proper use of MDI's       Goals/Expected Outcomes Compliance and understanding of oxygen saturation monitoring and breathing techniques to decrease shortness of breath.                Oxygen Discharge (Final Oxygen Re-Evaluation):  Oxygen Re-Evaluation - 12/17/22 1133        Program Oxygen Prescription   Program Oxygen  Prescription Continuous    Liters per minute 2      Home Oxygen   Home Oxygen Device Home Concentrator;E-Tanks    Sleep Oxygen Prescription None    Home Exercise Oxygen Prescription Continuous   pt wears as needed   Liters per minute 2    Home Resting Oxygen Prescription None    Compliance with Home Oxygen Use Yes      Goals/Expected Outcomes   Short Term Goals To learn and exhibit compliance with exercise, home and travel O2 prescription;To learn and understand importance of monitoring SPO2 with pulse oximeter and demonstrate accurate use of the pulse oximeter.;To learn and understand importance of maintaining oxygen saturations>88%;To learn and demonstrate proper pursed lip breathing techniques or other breathing techniques. ;To learn and demonstrate proper use of respiratory medications    Long  Term Goals Exhibits compliance with exercise, home  and travel O2 prescription;Maintenance of O2 saturations>88%;Compliance with respiratory medication;Exhibits proper breathing techniques, such as pursed lip breathing or other method taught during program session;Verbalizes importance of monitoring SPO2 with pulse oximeter and return demonstration;Demonstrates proper use of MDI's    Goals/Expected Outcomes Compliance and understanding of oxygen saturation monitoring and breathing techniques to decrease shortness of breath.             Initial Exercise Prescription:  Initial Exercise Prescription - 12/10/22 1200       Date of Initial Exercise RX and Referring Provider   Date 12/10/22    Referring Provider [name]    Expected Discharge Date 03/10/23      Oxygen   Oxygen Continuous    Liters 2    Maintain Oxygen Saturation 88% or higher      NuStep   Level 2    SPM 60    Minutes 15    METs 2      Track   Laps 12    Minutes 15    METs 1.5      Prescription Details   Frequency (times per week) 2    Duration Progress to 30 minutes of  continuous aerobic without signs/symptoms of physical distress      Intensity   THRR 40-80% of Max Heartrate 54-109    Ratings of Perceived Exertion 11-13    Perceived Dyspnea 0-4      Progression   Progression Continue to progress workloads to maintain intensity without signs/symptoms of physical distress.      Resistance Training   Training Prescription Yes    Weight blue bands    Reps 10-15             Perform Capillary Blood Glucose checks as needed.  Exercise Prescription Changes:   Exercise Prescription Changes     Row Name 12/28/22 1200             Response to Exercise   Blood Pressure (Admit) 118/64       Blood Pressure (Exercise) 150/70       Blood Pressure (Exit) 110/62       Heart Rate (Admit) 76 bpm       Heart Rate (Exercise) 96 bpm       Heart Rate (Exit) 79 bpm       Oxygen Saturation (Admit) 93 %       Oxygen Saturation (Exercise) 92 %       Oxygen Saturation (Exit) 94 %       Rating of Perceived Exertion (Exercise) 15       Perceived Dyspnea (Exercise) 2  Duration Continue with 30 min of aerobic exercise without signs/symptoms of physical distress.       Intensity THRR unchanged         Progression   Progression Continue to progress workloads to maintain intensity without signs/symptoms of physical distress.         Resistance Training   Training Prescription Yes       Weight blue bands       Reps 10-15       Time 10 Minutes         Oxygen   Oxygen Continuous       Liters 2         NuStep   Level 1       Minutes 15       METs 2.1         Track   Laps 10       Minutes 15       METs 2.54         Oxygen   Maintain Oxygen Saturation 88% or higher                Exercise Comments:   Exercise Comments     Row Name 12/16/22 1617           Exercise Comments Pt completed his first day of exercise. Criag exercised for 15 min on the [employer] and track. He averaged 1.9 METs at level 1 on the [employer] and 2.08 METs on the  track. Denvil performed the warmup and cooldown standing. He does have orthopedic limitations so he skips certain exercises or does alternatives. Will go over more alternative exercises. Discussed METs and how to increase METs. Unsure if Kamareon was retaining what I was saying.                Exercise Goals and Review:   Exercise Goals     Row Name 12/10/22 0940 12/17/22 1104           Exercise Goals   Increase Physical Activity Yes Yes      Intervention Provide advice, education, support and counseling about physical activity/exercise needs.;Develop an individualized exercise prescription for aerobic and resistive training based on initial evaluation findings, risk stratification, comorbidities and participant's personal goals. Provide advice, education, support and counseling about physical activity/exercise needs.;Develop an individualized exercise prescription for aerobic and resistive training based on initial evaluation findings, risk stratification, comorbidities and participant's personal goals.      Expected Outcomes Short Term: Attend rehab on a regular basis to increase amount of physical activity.;Long Term: Add in home exercise to make exercise part of routine and to increase amount of physical activity. Short Term: Attend rehab on a regular basis to increase amount of physical activity.;Long Term: Add in home exercise to make exercise part of routine and to increase amount of physical activity.      Increase Strength and Stamina Yes Yes      Intervention Provide advice, education, support and counseling about physical activity/exercise needs.;Develop an individualized exercise prescription for aerobic and resistive training based on initial evaluation findings, risk stratification, comorbidities and participant's personal goals. Provide advice, education, support and counseling about physical activity/exercise needs.;Develop an individualized exercise prescription for aerobic and  resistive training based on initial evaluation findings, risk stratification, comorbidities and participant's personal goals.      Expected Outcomes Short Term: Perform resistance training exercises routinely during rehab and add in resistance training at home;Short Term: Increase workloads from initial exercise  prescription for resistance, speed, and METs.;Long Term: Improve cardiorespiratory fitness, muscular endurance and strength as measured by increased METs and functional capacity ( ) Short Term: Perform resistance training exercises routinely during rehab and add in resistance training at home;Short Term: Increase workloads from initial exercise prescription for resistance, speed, and METs.;Long Term: Improve cardiorespiratory fitness, muscular endurance and strength as measured by increased METs and functional capacity ( )      Able to understand and use rate of perceived exertion (RPE) scale Yes Yes      Intervention Provide education and explanation on how to use RPE scale Provide education and explanation on how to use RPE scale      Expected Outcomes Short Term: Able to use RPE daily in rehab to express subjective intensity level;Long Term:  Able to use RPE to guide intensity level when exercising independently Short Term: Able to use RPE daily in rehab to express subjective intensity level;Long Term:  Able to use RPE to guide intensity level when exercising independently      Able to understand and use Dyspnea scale Yes Yes      Intervention Provide education and explanation on how to use Dyspnea scale Provide education and explanation on how to use Dyspnea scale      Expected Outcomes Short Term: Able to use Dyspnea scale daily in rehab to express subjective sense of shortness of breath during exertion;Long Term: Able to use Dyspnea scale to guide intensity level when exercising independently Short Term: Able to use Dyspnea scale daily in rehab to express subjective sense of shortness of  breath during exertion;Long Term: Able to use Dyspnea scale to guide intensity level when exercising independently      Knowledge and understanding of Target Heart Rate Range (THRR) Yes Yes      Intervention Provide education and explanation of THRR including how the numbers were predicted and where they are located for reference Provide education and explanation of THRR including how the numbers were predicted and where they are located for reference      Expected Outcomes Short Term: Able to state/look up THRR;Long Term: Able to use THRR to govern intensity when exercising independently;Short Term: Able to use daily as guideline for intensity in rehab Short Term: Able to state/look up THRR;Long Term: Able to use THRR to govern intensity when exercising independently;Short Term: Able to use daily as guideline for intensity in rehab      Understanding of Exercise Prescription Yes Yes      Intervention Provide education, explanation, and written materials on patient's individual exercise prescription Provide education, explanation, and written materials on patient's individual exercise prescription      Expected Outcomes Short Term: Able to explain program exercise prescription;Long Term: Able to explain home exercise prescription to exercise independently Short Term: Able to explain program exercise prescription;Long Term: Able to explain home exercise prescription to exercise independently               Exercise Goals Re-Evaluation :  Exercise Goals Re-Evaluation     Row Name 12/17/22 1104             Exercise Goal Re-Evaluation   Exercise Goals Review Increase Physical Activity;Able to understand and use Dyspnea scale;Understanding of Exercise Prescription;Increase Strength and Stamina;Knowledge and understanding of Target Heart Rate Range (THRR);Able to understand and use rate of perceived exertion (RPE) scale       Comments Jacek has completed 1 exercise session. He exercises for 15  min on the [employer] and  track. He averages 1.9 METs at level 1 on the [place]ustep and 2.08 METs on the track. He performs the warmup and cooldown standing/seated dependent on his orthopedic limitations. Oland need verbal cues and alternative exercises when doing the cooldown. It is too soon to note any discernable progressions. Will continue to monitor and progress as able.       Expected Outcomes Through exercise at rehab and home, the patient will decrease shortness of breath with daily activities and feel confident in carrying out an exercise regimen at home.                Discharge Exercise Prescription (Final Exercise Prescription Changes):  Exercise Prescription Changes - 12/28/22 1200       Response to Exercise   Blood Pressure (Admit) 118/64    Blood Pressure (Exercise) 150/70    Blood Pressure (Exit) 110/62    Heart Rate (Admit) 76 bpm    Heart Rate (Exercise) 96 bpm    Heart Rate (Exit) 79 bpm    Oxygen Saturation (Admit) 93 %    Oxygen Saturation (Exercise) 92 %    Oxygen Saturation (Exit) 94 %    Rating of Perceived Exertion (Exercise) 15    Perceived Dyspnea (Exercise) 2    Duration Continue with 30 min of aerobic exercise without signs/symptoms of physical distress.    Intensity THRR unchanged      Progression   Progression Continue to progress workloads to maintain intensity without signs/symptoms of physical distress.      Resistance Training   Training Prescription Yes    Weight blue bands    Reps 10-15    Time 10 Minutes      Oxygen   Oxygen Continuous    Liters 2      NuStep   Level 1    Minutes 15    METs 2.1      Track   Laps 10    Minutes 15    METs 2.54      Oxygen   Maintain Oxygen Saturation 88% or higher             Nutrition:  Target Goals: Understanding of nutrition guidelines, daily intake of sodium 1500mg , cholesterol 200mg , calories 30% from fat and 7% or less from saturated fats, daily to have 5 or more servings of fruits and  vegetables.  Biometrics:  Pre Biometrics - 12/10/22 1057       Pre Biometrics   Grip Strength 46 kg              Nutrition Therapy Plan and Nutrition Goals:  Nutrition Therapy & Goals - 12/16/22 1119       Nutrition Therapy   Diet Heart Healthy Diet      Personal Nutrition Goals   Nutrition Goal Patient to improve diet quality by using the plate method as a guide for meal planning to include lean protein/plant protein, fruits, vegetables, whole grains, nonfat dairy as part of a well-balanced diet.    Comments Patient will benefit from participation in pulmonary rehab for nutrition, exercise, and lifestyle modification.      Intervention Plan   Intervention Prescribe, educate and counsel regarding individualized specific dietary modifications aiming towards targeted core components such as weight, hypertension, lipid management, diabetes, heart failure and other comorbidities.;Nutrition handout(s) given to patient.    Expected Outcomes Short Term Goal: Understand basic principles of dietary content, such as calories, fat, sodium, cholesterol and nutrients.;Long Term Goal: Adherence to prescribed nutrition plan.  Nutrition Assessments:  Nutrition Assessments - 12/16/22 1123       Rate Your Plate Scores   Pre Score 58            MEDIFICTS Score Key: ?70 Need to make dietary changes  40-70 Heart Healthy Diet ? 40 Therapeutic Level Cholesterol Diet  Flowsheet Row PULMONARY REHAB CHRONIC OBSTRUCTIVE PULMONARY DISEASE from 12/16/2022 in [facility] for [facility], V[facility], & Lung Health  Picture Your Plate Total Score on Admission 58      Picture Your Plate Scores: <14 Unhealthy dietary pattern with much room for improvement. 41-50 Dietary pattern unlikely to meet recommendations for good health and room for improvement. 51-60 More healthful dietary pattern, with some room for improvement.  >60 Healthy dietary pattern, although  there may be some specific behaviors that could be improved.    Nutrition Goals Re-Evaluation:  Nutrition Goals Re-Evaluation     Row Name 12/16/22 1119             Goals   Current Weight 246 lb 0.5 oz (111.6 kg)       Comment ferritin 11.8 (prescribed ferrous sulfate 325mg ), No recent A1c or lipid panel (most recent from 2020)       Expected Outcome Patient will benefit from participation in pulmonary rehab for nutrition, exercise, and lifestyle modification.                Nutrition Goals Discharge (Final Nutrition Goals Re-Evaluation):  Nutrition Goals Re-Evaluation - 12/16/22 1119       Goals   Current Weight 246 lb 0.5 oz (111.6 kg)    Comment ferritin 11.8 (prescribed ferrous sulfate 325mg ), No recent A1c or lipid panel (most recent from 2020)    Expected Outcome Patient will benefit from participation in pulmonary rehab for nutrition, exercise, and lifestyle modification.             Psychosocial: Target Goals: Acknowledge presence or absence of significant depression and/or stress, maximize coping skills, provide positive support system. Participant is able to verbalize types and ability to use techniques and skills needed for reducing stress and depression.  Initial Review & Psychosocial Screening:  Initial Psych Review & Screening - 12/10/22 0932       Initial Review   Current issues with None Identified      Family Dynamics   Good Support System? Yes      Barriers   Psychosocial barriers to participate in program There are no identifiable barriers or psychosocial needs.             Quality of Life Scores:  Scores of 19 and below usually indicate a poorer quality of life in these areas.  A difference of  2-3 points is a clinically meaningful difference.  A difference of 2-3 points in the total score of the Quality of Life Index has been associated with significant improvement in overall quality of life, self-image, physical symptoms, and general  health in studies assessing change in quality of life.  PHQ-9: Review Flowsheet  More data exists      12/10/2022 09/07/2022 06/22/2022 03/26/2021 03/11/2020  Depression screen PHQ 2/9  Decreased Interest 0 0 0 0 0  Down, Depressed, Hopeless 0 0 0 0 0  PHQ - 2 Score 0 0 0 0 0  Altered sleeping 0 - 3 - -  Tired, decreased energy 1 - 3 - -  Change in appetite 1 - 3 - -  Feeling bad or failure about yourself  0 - 0 - -  Trouble concentrating 0 - 0 - -  Moving slowly or fidgety/restless 0 - 0 - -  Suicidal thoughts 0 - 0 - -  PHQ-9 Score 2 - 9 - -  Difficult doing work/chores Not difficult at all - Somewhat difficult - -    Details           Interpretation of Total Score  Total Score Depression Severity:  1-4 = Minimal depression, 5-9 = Mild depression, 10-14 = Moderate depression, 15-19 = Moderately severe depression, 20-27 = Severe depression   Psychosocial Evaluation and Intervention:  Psychosocial Evaluation - 12/10/22 0933       Psychosocial Evaluation & Interventions   Interventions Encouraged to exercise with the program and follow exercise prescription    Comments Tauris denies any psychosocial barriers or concerns at this time    Expected Outcomes For Brenda to participate in PR free of any psychosocial barriers or concerns    Continue Psychosocial Services  No Follow up required             Psychosocial Re-Evaluation:  Psychosocial Re-Evaluation     Row Name 12/24/22 1558             Psychosocial Re-Evaluation   Current issues with None Identified       Comments Keawe denies any psychosocial barriers or concerns at this time.       Expected Outcomes For Arinzechukwu to participate in PR free of any psychosocial barriers or concerns       Interventions Encouraged to attend Pulmonary Rehabilitation for the exercise       Continue Psychosocial Services  No Follow up required                Psychosocial Discharge (Final Psychosocial Re-Evaluation):   Psychosocial Re-Evaluation - 12/24/22 1558       Psychosocial Re-Evaluation   Current issues with None Identified    Comments Shawndell denies any psychosocial barriers or concerns at this time.    Expected Outcomes For Chavez to participate in PR free of any psychosocial barriers or concerns    Interventions Encouraged to attend Pulmonary Rehabilitation for the exercise    Continue Psychosocial Services  No Follow up required             Education: Education Goals: Education classes will be provided on a weekly basis, covering required topics. Participant will state understanding/return demonstration of topics presented.  Learning Barriers/Preferences:  Learning Barriers/Preferences - 12/10/22 0934       Learning Barriers/Preferences   Learning Barriers Sight;Hearing    Learning Preferences Group Instruction;Individual Instruction             Education Topics: Introduction to Pulmonary Rehab Group instruction provided by PowerPoint, verbal discussion, and written material to support subject matter. Instructor reviews what Pulmonary Rehab is, the purpose of the program, and how patients are referred.     Know Your Numbers Group instruction that is supported by a PowerPoint presentation. Instructor discusses importance of knowing and understanding resting, exercise, and post-exercise oxygen saturation, heart rate, and blood pressure. Oxygen saturation, heart rate, blood pressure, rating of perceived exertion, and dyspnea are reviewed along with a normal range for these values.    Exercise for the Pulmonary Patient Group instruction that is supported by a PowerPoint presentation. Instructor discusses benefits of exercise, core components of exercise, frequency, duration, and intensity of an exercise routine, importance of utilizing pulse oximetry during exercise, safety while exercising, and options  of places to exercise outside of rehab.       MET Level  Group  instruction provided by PowerPoint, verbal discussion, and written material to support subject matter. Instructor reviews what METs are and how to increase METs.    Pulmonary Medications Verbally interactive group education provided by instructor with focus on inhaled medications and proper administration.   Anatomy and Physiology of the Respiratory System Group instruction provided by PowerPoint, verbal discussion, and written material to support subject matter. Instructor reviews respiratory cycle and anatomical components of the respiratory system and their functions. Instructor also reviews differences in obstructive and restrictive respiratory diseases with examples of each.    Oxygen Safety Group instruction provided by PowerPoint, verbal discussion, and written material to support subject matter. There is an overview of "What is Oxygen" and "Why do we need it".  Instructor also reviews how to create a safe environment for oxygen use, the importance of using oxygen as prescribed, and the risks of noncompliance. There is a brief discussion on traveling with oxygen and resources the patient may utilize.   Oxygen Use Group instruction provided by PowerPoint, verbal discussion, and written material to discuss how supplemental oxygen is prescribed and different types of oxygen supply systems. Resources for more information are provided.  Flowsheet Row PULMONARY REHAB CHRONIC OBSTRUCTIVE PULMONARY DISEASE from 12/16/2022 in [facility] for Heart, Vascular, & Lung Health  Date 12/16/22  Educator RT  Instruction Review Code 1- Verbalizes Understanding       Breathing Techniques Group instruction that is supported by demonstration and informational handouts. Instructor discusses the benefits of pursed lip and diaphragmatic breathing and detailed demonstration on how to perform both.  Flowsheet Row PULMONARY REHAB CHRONIC OBSTRUCTIVE PULMONARY DISEASE from 12/23/2022 in  [facility] for Heart, Vascular, & Lung Health  Date 12/23/22  Educator RN  Instruction Review Code 1- Verbalizes Understanding        Risk Factor Reduction Group instruction that is supported by a PowerPoint presentation. Instructor discusses the definition of a risk factor, different risk factors for pulmonary disease, and how the heart and lungs work together.   MD Day A group question and answer session with a medical doctor that allows participants to ask questions that relate to their pulmonary disease state.   Nutrition for the Pulmonary Patient Group instruction provided by PowerPoint slides, verbal discussion, and written materials to support subject matter. The instructor gives an explanation and review of healthy diet recommendations, which includes a discussion on weight management, recommendations for fruit and vegetable consumption, as well as protein, fluid, caffeine, fiber, sodium, sugar, and alcohol. Tips for eating when patients are short of breath are discussed.    Other Education Group or individual verbal, written, or video instructions that support the educational goals of the pulmonary rehab program.    Knowledge Questionnaire Score:  Knowledge Questionnaire Score - 12/10/22 1001       Knowledge Questionnaire Score   Pre Score 15/18             Core Components/Risk Factors/Patient Goals at Admission:  Personal Goals and Risk Factors at Admission - 12/10/22 0935       Core Components/Risk Factors/Patient Goals on Admission    Weight Management Yes;Weight Loss    Intervention Weight Management: Develop a combined nutrition and exercise program designed to reach desired caloric intake, while maintaining appropriate intake of nutrient and fiber, sodium and fats, and appropriate energy expenditure required for the weight  goal.;Weight Management: Provide education and appropriate resources to help participant work on and attain  dietary goals.;Weight Management/Obesity: Establish reasonable short term and long term weight goals.;Obesity: Provide education and appropriate resources to help participant work on and attain dietary goals.    Expected Outcomes Short Term: Continue to assess and modify interventions until short term weight is achieved;Long Term: Adherence to nutrition and physical activity/exercise program aimed toward attainment of established weight goal;Weight Maintenance: Understanding of the daily nutrition guidelines, which includes 25-35% calories from fat, 7% or less cal from saturated fats, less than 200mg  cholesterol, less than 1.5gm of sodium, & 5 or more servings of fruits and vegetables daily;Weight Loss: Understanding of general recommendations for a balanced deficit meal plan, which promotes 1-2 lb weight loss per week and includes a negative energy balance of [phone] kcal/d;Understanding recommendations for meals to include 15-35% energy as protein, 25-35% energy from fat, 35-60% energy from carbohydrates, less than 200mg  of dietary cholesterol, 20-35 gm of total fiber daily;Understanding of distribution of calorie intake throughout the day with the consumption of 4-5 meals/snacks    Improve shortness of breath with ADL's Yes    Intervention Provide education, individualized exercise plan and daily activity instruction to help decrease symptoms of SOB with activities of daily living.    Expected Outcomes Short Term: Improve cardiorespiratory fitness to achieve a reduction of symptoms when performing ADLs;Long Term: Be able to perform more ADLs without symptoms or delay the onset of symptoms    Increase knowledge of respiratory medications and ability to use respiratory devices properly  Yes    Expected Outcomes Long Term: Maintain appropriate use of medications, inhalers, and oxygen therapy.;Short Term: Achieves understanding of medications use. Understands that oxygen is a medication prescribed by  physician. Demonstrates appropriate use of inhaler and oxygen therapy.             Core Components/Risk Factors/Patient Goals Review:   Goals and Risk Factor Review     Row Name 12/24/22 1559             Core Components/Risk Factors/Patient Goals Review   Personal Goals Review Weight Management/Obesity;Improve shortness of breath with ADL's;Develop more efficient breathing techniques such as purse lipped breathing and diaphragmatic breathing and practicing self-pacing with activity.;Increase knowledge of respiratory medications and ability to use respiratory devices properly.       Review Goal progressing for weight loss. Gildardo has been working with our dietician for weight loss goals. Goal progressing on improving her shortness of breath with ADLs. Goal progressing on developing more efficient breathing techniques such as purse lipped breathing and diaphragmatic breathing; and practicing self-pacing with activity.He has to be reminded to use purse lip breathing when he gets short of breath.  Goal progressing for increasing knowledge of respiratory medications and ability to use respiratory devices properly. We will continue to monitor Ayomikun's progress throughout the program.       Expected Outcomes See admission goals                Core Components/Risk Factors/Patient Goals at Discharge (Final Review):   Goals and Risk Factor Review - 12/24/22 1559       Core Components/Risk Factors/Patient Goals Review   Personal Goals Review Weight Management/Obesity;Improve shortness of breath with ADL's;Develop more efficient breathing techniques such as purse lipped breathing and diaphragmatic breathing and practicing self-pacing with activity.;Increase knowledge of respiratory medications and ability to use respiratory devices properly.    Review Goal progressing for weight loss. Lucas has  been working with our dietician for weight loss goals. Goal progressing on improving her  shortness of breath with ADLs. Goal progressing on developing more efficient breathing techniques such as purse lipped breathing and diaphragmatic breathing; and practicing self-pacing with activity.He has to be reminded to use purse lip breathing when he gets short of breath.  Goal progressing for increasing knowledge of respiratory medications and ability to use respiratory devices properly. We will continue to monitor Kimo's progress throughout the program.    Expected Outcomes See admission goals             ITP Comments:Pt is making expected progress toward Pulmonary Rehab goals after completing 4 sessions. Recommend continued exercise, life style modification, education, and utilization of breathing techniques to increase stamina and strength, while also decreasing shortness of breath with exertion.  Dr. Mechele Collin is Medical Director for Pulmonary Rehab at [facility].     Comments: Dr. Mechele Collin is Medical Director for Pulmonary Rehab at [facility].

## 2022-12-30 ENCOUNTER — Encounter (HOSPITAL_COMMUNITY)
Admission: RE | Admit: 2022-12-30 | Discharge: 2022-12-30 | Disposition: A | Discharge status: Home / Self Care | Payer: Medicare Other | Source: Ambulatory Visit | Attending: Pulmonary Disease | Admitting: Pulmonary Disease

## 2022-12-30 ENCOUNTER — Telehealth: Payer: Self-pay | Admitting: Pharmacy Technician

## 2022-12-30 ENCOUNTER — Other Ambulatory Visit: Payer: Self-pay | Admitting: Family Medicine

## 2022-12-30 DIAGNOSIS — Z5986 Financial insecurity: Secondary | ICD-10-CM

## 2022-12-30 DIAGNOSIS — R0602 Shortness of breath: Secondary | ICD-10-CM | POA: Diagnosis not present

## 2022-12-30 DIAGNOSIS — U099 Post covid-19 condition, unspecified: Secondary | ICD-10-CM

## 2022-12-30 NOTE — Progress Notes (Signed)
[facility] Customer service manager [facility])                                            [facility] Quality Pharmacy Team    12/30/2022  Brendan Hopkins [birth year] [id number]                                     Medication Assistance Referral  Referral From: Autumn Messing at [facility]   Medication/Company: [name] Patient application portion:  Mailed Provider application portion: Faxed  to Dr. Lynnea Ferrier Provider address/fax verified via: Office website  Medication/Company: [name] Patient application portion:  Mailed Provider application portion: Faxed  to Dr. Luciano Cutter Provider address/fax verified via: Office website  [name], CPhT   [facility] Office: [phone] Fax: [phone] Email: [email or web address]

## 2022-12-30 NOTE — Progress Notes (Signed)
Daily Session Note  Patient Details  Name: Brendan Hopkins MRN: [id number] Date of Birth: August 16, 1935 Referring Provider:   Doristine Devoid Pulmonary Rehab Walk Test from 12/10/2022 in [facility] for Heart, Vascular, & Lung Health  Referring Provider [name]       Encounter Date: 12/30/2022  Check In:  Session Check In - 12/30/22 1045       Check-In   Supervising physician immediately available to respond to emergencies [facility] MD immediately available    Physician(s) Joni Reining, NP    Location [facility]    Staff Present Elissa Lovett BS, ACSM-CEP, Exercise Physiologist;Mary Gerre Scull, RN, BSN; Charlean Sanfilippo, MS, ACSM-CEP, Exercise Physiologist;Bailey Wallace Cullens, MS, Exercise Physiologist    Virtual Visit No    Medication changes reported     No    Fall or balance concerns reported    Yes    Comments pt does not have good balance    Tobacco Cessation No Change    Warm-up and Cool-down Performed as group-led instruction    Resistance Training Performed Yes    VAD Patient? No    PAD/SET Patient? No      Pain Assessment   Currently in Pain? No/denies    Multiple Pain Sites No             Capillary Blood Glucose: No results found for this or any previous visit (from the past 24 hour(s)).    Social History   Tobacco Use  Smoking Status Former   Current packs/day: 0.00   Average packs/day: 2.0 packs/day for 57.0 years (114.0 ttl pk-yrs)   Types: Cigarettes   Start date: 05/24/1950   Quit date: 05/25/2007   Years since quitting: 15.6  Smokeless Tobacco Never  Tobacco Comments   Does not qualify due to age.     Goals Met:  Proper associated with RPD/PD & O2 Sat Independence with exercise equipment Exercise tolerated well No report of concerns or symptoms today Strength training completed today  Goals Unmet:  Not Applicable  Comments: Service time is from 1008 to 1145.    Dr. Mechele Collin is Medical Director  for Pulmonary Rehab at [facility].

## 2022-12-31 DIAGNOSIS — J449 Chronic obstructive pulmonary disease, unspecified: Secondary | ICD-10-CM | POA: Diagnosis not present

## 2022-12-31 DIAGNOSIS — N1831 Chronic kidney disease, stage 3a: Secondary | ICD-10-CM | POA: Diagnosis not present

## 2022-12-31 MED ORDER — TRELEGY ELLIPTA 200-62.5-25 MCG/ACT IN AEPB: 1.0000 | INHALATION_SPRAY | Freq: Every day | RESPIRATORY_TRACT | 11 refills | Status: DC

## 2022-12-31 NOTE — Telephone Encounter (Signed)
Requested Prescriptions  Pending Prescriptions Disp Refills   amLODipine (NORVASC) 5 MG tablet [Pharmacy Med Name: Amlodipine Besylate 5mg  Tablet] 90 tablet 0    Sig: Take 1 tablet by mouth at bedtime     Cardiovascular: Calcium Channel Blockers 2 Failed - 12/30/2022  2:13 AM      Failed - Valid encounter within last 6 months    Recent Outpatient Visits           1 year ago Chronic obstructive pulmonary disease, unspecified COPD type (HCC)   [facility] Pickard, Priscille Heidelberg, MD   1 year ago Left sided sciatica   [facility] Donita Brooks, MD   1 year ago Vertigo   [facility] Donita Brooks, MD   1 year ago S/P total left hip arthroplasty   [facility] Donita Brooks, MD   2 years ago Chronic obstructive pulmonary disease, unspecified COPD type (HCC)   [facility] Pickard, Priscille Heidelberg, MD              Passed - Last BP in normal range    BP Readings from Last 1 Encounters:  12/10/22 132/62         Passed - Last Heart Rate in normal range    Pulse Readings from Last 1 Encounters:  12/10/22 89

## 2022-12-31 NOTE — Addendum Note (Signed)
Addended by: Luciano Cutter on: 12/31/2022 02:07 PM   Modules accepted: Orders

## 2023-01-02 DIAGNOSIS — N1831 Chronic kidney disease, stage 3a: Secondary | ICD-10-CM | POA: Diagnosis not present

## 2023-01-02 DIAGNOSIS — J449 Chronic obstructive pulmonary disease, unspecified: Secondary | ICD-10-CM | POA: Diagnosis not present

## 2023-01-04 ENCOUNTER — Encounter (HOSPITAL_COMMUNITY)
Admission: RE | Admit: 2023-01-04 | Discharge: 2023-01-04 | Disposition: A | Discharge status: Home / Self Care | Payer: Medicare Other | Source: Ambulatory Visit | Attending: Pulmonary Disease | Admitting: Pulmonary Disease

## 2023-01-04 DIAGNOSIS — R0602 Shortness of breath: Secondary | ICD-10-CM | POA: Diagnosis not present

## 2023-01-04 DIAGNOSIS — U099 Post covid-19 condition, unspecified: Secondary | ICD-10-CM | POA: Diagnosis not present

## 2023-01-04 NOTE — Progress Notes (Signed)
Daily Session Note  Patient Details  Name: Brendan Hopkins MRN: [id number] Date of Birth: 1936-04-08 Referring Provider:   Doristine Devoid Pulmonary Rehab Walk Test from 12/10/2022 in [facility] for Heart, Vascular, & Lung Health  Referring Provider [name]       Encounter Date: 01/04/2023  Check In:  Session Check In - 01/04/23 1157       Check-In   Supervising physician immediately available to respond to emergencies [facility] MD immediately available    Physician(s) Jari Favre, PA    Location [facility]    Staff Present Elissa Lovett BS, ACSM-CEP, Exercise Physiologist;Mary Gerre Scull, RN, BSN;Casey Charlean Sanfilippo, MS, ACSM-CEP, Exercise Physiologist    Virtual Visit No    Medication changes reported     No    Fall or balance concerns reported    No    Tobacco Cessation No Change    Warm-up and Cool-down Performed as group-led instruction    Resistance Training Performed Yes    VAD Patient? No    PAD/SET Patient? No      Pain Assessment   Currently in Pain? No/denies    Multiple Pain Sites No             Capillary Blood Glucose: No results found for this or any previous visit (from the past 24 hour(s)).    Social History   Tobacco Use  Smoking Status Former   Current packs/day: 0.00   Average packs/day: 2.0 packs/day for 57.0 years (114.0 ttl pk-yrs)   Types: Cigarettes   Start date: 05/24/1950   Quit date: 05/25/2007   Years since quitting: 15.6  Smokeless Tobacco Never  Tobacco Comments   Does not qualify due to age.     Goals Met:  Proper associated with RPD/PD & O2 Sat Exercise tolerated well No report of concerns or symptoms today Strength training completed today  Goals Unmet:  Not Applicable  Comments: Service time is from 1013 to 1138.    Dr. Mechele Collin is Medical Director for Pulmonary Rehab at [facility].

## 2023-01-06 ENCOUNTER — Ambulatory Visit (HOSPITAL_BASED_OUTPATIENT_CLINIC_OR_DEPARTMENT_OTHER): Payer: Medicare Other | Admitting: Pulmonary Disease

## 2023-01-06 ENCOUNTER — Ambulatory Visit (INDEPENDENT_AMBULATORY_CARE_PROVIDER_SITE_OTHER): Payer: Medicare Other

## 2023-01-06 ENCOUNTER — Encounter (HOSPITAL_COMMUNITY)
Admission: RE | Admit: 2023-01-06 | Discharge: 2023-01-06 | Disposition: A | Discharge status: Home / Self Care | Payer: Medicare Other | Source: Ambulatory Visit | Attending: Pulmonary Disease | Admitting: Pulmonary Disease

## 2023-01-06 DIAGNOSIS — J449 Chronic obstructive pulmonary disease, unspecified: Secondary | ICD-10-CM

## 2023-01-06 DIAGNOSIS — U099 Post covid-19 condition, unspecified: Secondary | ICD-10-CM | POA: Diagnosis not present

## 2023-01-06 DIAGNOSIS — R0602 Shortness of breath: Secondary | ICD-10-CM | POA: Diagnosis not present

## 2023-01-06 NOTE — Progress Notes (Signed)
Patient came for amb o2 walk.  SATURATION QUALIFICATIONS: (This note is used to comply with regulatory documentation for home oxygen)   Patient Saturations on Room Air at Rest = 94% pulse 59   Patient Saturations on Room Air while Ambulating = 88% pulse 99   Patient Saturations on 2 Liters of oxygen via POC while Ambulating = 89% pulse 103 Patient saturations on 3 Liters of oxygen via POC while Ambulating= 94% pulse 74   Please briefly explain why patient needs home oxygen: Patient qualifies for oxygen because his oxygen levels drop below threshold of 88% on ambulation on room air.    Pt qualified with POC in office. He is in need of oxygen tanks and POC. DME: [employer] ANM,CMA, 12/04/2021

## 2023-01-06 NOTE — Progress Notes (Signed)
Daily Session Note  Patient Details  Name: Brendan Hopkins MRN: [id number] Date of Birth: 01-23-1936 Referring Provider:   Doristine Devoid Pulmonary Rehab Walk Test from 12/10/2022 in [facility] for Heart, Vascular, & Lung Health  Referring Provider [name]       Encounter Date: 01/06/2023  Check In:  Session Check In - 01/06/23 1211       Check-In   Supervising physician immediately available to respond to emergencies [facility] MD immediately available    Physician(s) Edd Fabian, NP    [facility]    Staff Present Elissa Lovett BS, ACSM-CEP, Exercise Physiologist; Gerre Scull, RN, BSN;Casey Charlean Sanfilippo, MS, ACSM-CEP, Exercise Physiologist    Virtual Visit No    Medication changes reported     No    Fall or balance concerns reported    No    Tobacco Cessation No Change    Warm-up and Cool-down Performed as group-led instruction    Resistance Training Performed Yes    VAD Patient? No    PAD/SET Patient? No      Pain Assessment   Currently in Pain? No/denies    Pain Score 0-No pain    Multiple Pain Sites No             Capillary Blood Glucose: No results found for this or any previous visit (from the past 24 hour(s)).    Social History   Tobacco Use  Smoking Status Former   Current packs/day: 0.00   Average packs/day: 2.0 packs/day for 57.0 years (114.0 ttl pk-yrs)   Types: Cigarettes   Start date: 05/24/1950   Quit date: 05/25/2007   Years since quitting: 15.6  Smokeless Tobacco Never  Tobacco Comments   Does not qualify due to age.     Goals Met:  Independence with exercise equipment Exercise tolerated well No report of concerns or symptoms today Strength training completed today  Goals Unmet:  Not Applicable  Comments: Service time is from 1008 to 1144    Dr. Mechele Collin is Medical Director for Pulmonary Rehab at [facility].

## 2023-01-07 ENCOUNTER — Telehealth (HOSPITAL_BASED_OUTPATIENT_CLINIC_OR_DEPARTMENT_OTHER): Payer: Self-pay | Admitting: Pulmonary Disease

## 2023-01-07 DIAGNOSIS — R0602 Shortness of breath: Secondary | ICD-10-CM

## 2023-01-07 DIAGNOSIS — J449 Chronic obstructive pulmonary disease, unspecified: Secondary | ICD-10-CM

## 2023-01-07 MED ORDER — TRELEGY ELLIPTA 200-62.5-25 MCG/ACT IN AEPB: 1.0000 | INHALATION_SPRAY | Freq: Every day | RESPIRATORY_TRACT | 5 refills | Status: DC

## 2023-01-07 NOTE — Telephone Encounter (Signed)
Order placed

## 2023-01-07 NOTE — Addendum Note (Signed)
Addended by: Luciano Cutter on: 01/07/2023 11:57 AM   Modules accepted: Orders

## 2023-01-07 NOTE — Telephone Encounter (Signed)
Per note on 01/06/23:  Patient came for amb o2 walk.  SATURATION QUALIFICATIONS: (This note is used to comply with regulatory documentation for home oxygen)   Patient Saturations on Room Air at Rest = 94% pulse 59   Patient Saturations on Room Air while Ambulating = 88% pulse 99   Patient Saturations on 2 Liters of oxygen via POC while Ambulating = 89% pulse 103 Patient saturations on 3 Liters of oxygen via POC while Ambulating= 94% pulse 74   Please briefly explain why patient needs home oxygen: Patient qualifies for oxygen because his oxygen levels drop below threshold of 88% on ambulation on room air.    Pt qualified with POC in office. He is in need of oxygen tanks and POC. DME: [employer]  Plan: Please order patient POC for 3L with activity and 3L at night

## 2023-01-10 DIAGNOSIS — J449 Chronic obstructive pulmonary disease, unspecified: Secondary | ICD-10-CM | POA: Diagnosis not present

## 2023-01-10 DIAGNOSIS — N1831 Chronic kidney disease, stage 3a: Secondary | ICD-10-CM | POA: Diagnosis not present

## 2023-01-11 ENCOUNTER — Encounter (HOSPITAL_COMMUNITY)
Admission: RE | Admit: 2023-01-11 | Discharge: 2023-01-11 | Disposition: A | Discharge status: Home / Self Care | Payer: Medicare Other | Source: Ambulatory Visit | Attending: Pulmonary Disease | Admitting: Pulmonary Disease

## 2023-01-11 ENCOUNTER — Encounter (HOSPITAL_COMMUNITY): Payer: Self-pay

## 2023-01-11 ENCOUNTER — Other Ambulatory Visit: Payer: Self-pay | Admitting: Family Medicine

## 2023-01-11 VITALS — Wt 246.5 lb

## 2023-01-11 DIAGNOSIS — U099 Post covid-19 condition, unspecified: Secondary | ICD-10-CM | POA: Diagnosis not present

## 2023-01-11 DIAGNOSIS — R0602 Shortness of breath: Secondary | ICD-10-CM

## 2023-01-11 NOTE — Progress Notes (Signed)
Daily Session Note  Patient Details  Name: Brendan Hopkins MRN: [id number] Date of Birth: Jul 28, 1935 Referring Provider:   Doristine Devoid Pulmonary Rehab Walk Test from 12/10/2022 in [facility]  Referring Provider [name]       Encounter Date: 01/11/2023  Check In:  Session Check In - 01/11/23 1113       Check-In   Supervising physician immediately available to respond to emergencies [facility] MD immediately available    Physician(s) Edd Fabian, NP    Location [facility]    Staff Present Essie Hart, RN, BSN;Casey Charlean Sanfilippo, MS, ACSM-CEP, Exercise Physiologist    Virtual Visit No    Medication changes reported     No    Fall or balance concerns reported    No    Tobacco Cessation No Change    Warm-up and Cool-down Performed as group-led instruction    Resistance Training Performed Yes    VAD Patient? No    PAD/SET Patient? No      Pain Assessment   Currently in Pain? No/denies    Multiple Pain Sites No             Capillary Blood Glucose: No results found for this or any previous visit (from the past 24 hour(s)).   Exercise Prescription Changes - 01/11/23 1100       Response to Exercise   Blood Pressure (Admit) 120/64    Blood Pressure (Exercise) 148/70    Blood Pressure (Exit) 128/68    Heart Rate (Admit) 70 bpm    Heart Rate (Exercise) 92 bpm    Heart Rate (Exit) 71 bpm    Oxygen Saturation (Admit) 96 %    Oxygen Saturation (Exercise) 92 %    Oxygen Saturation (Exit) 95 %    Rating of Perceived Exertion (Exercise) 13    Perceived Dyspnea (Exercise) 2    Duration Continue with 30 min of aerobic exercise without signs/symptoms of physical distress.    Intensity THRR unchanged      Progression   Progression Continue to progress workloads to maintain intensity without signs/symptoms of physical distress.      Resistance Training   Training Prescription Yes     Weight blue bands    Reps 10-15    Time 10 Minutes      Oxygen   Oxygen Continuous    Liters 2      NuStep   Level 2    Minutes 15    METs 2.8      Track   Laps 11    Minutes 15    METs 2.69      Oxygen   Maintain Oxygen Saturation 88% or higher             Social History   Tobacco Use  Smoking Status Former   Current packs/day: 0.00   Average packs/day: 2.0 packs/day for 57.0 years (114.0 ttl pk-yrs)   Types: Cigarettes   Start date: 05/24/1950   Quit date: 05/25/2007   Years since quitting: 15.6  Smokeless Tobacco Never  Tobacco Comments   Does not qualify due to age.     Goals Met:  Independence with exercise equipment Exercise tolerated well No report of concerns or symptoms today Strength training completed today  Goals Unmet:  Not Applicable  Comments: Service time is from 1004 to 1140    Dr. Mechele Collin is Medical Director for Pulmonary Rehab at  [facility].

## 2023-01-12 NOTE — Telephone Encounter (Signed)
Requested Prescriptions  Pending Prescriptions Disp Refills   metoprolol succinate (TOPROL-XL) 25 MG 24 hr tablet [Pharmacy Med Name: Metoprolol Succinate ER 25 MG Oral Tablet Extended Release 24 Hour] 90 tablet 0    Sig: Take 1 tablet by mouth once daily     Cardiovascular:  Beta Blockers Failed - 01/11/2023 12:10 PM      Failed - Valid encounter within last 6 months    Recent Outpatient Visits           1 year ago Chronic obstructive pulmonary disease, unspecified COPD type (HCC)   [facility] Pickard, Priscille Heidelberg, MD   1 year ago Left sided sciatica   [facility] Pickard, Priscille Heidelberg, MD   1 year ago Vertigo   [facility] Donita Brooks, MD   1 year ago S/P total left hip arthroplasty   [facility] Donita Brooks, MD   2 years ago Chronic obstructive pulmonary disease, unspecified COPD type (HCC)   [facility] Pickard, Priscille Heidelberg, MD       Future Appointments             In 1 month Luciano Cutter, MD [facility] at [facility] [place]            Passed - Last BP in normal range    BP Readings from Last 1 Encounters:  12/10/22 132/62         Passed - Last Heart Rate in normal range    Pulse Readings from Last 1 Encounters:  12/10/22 89

## 2023-01-13 ENCOUNTER — Encounter (HOSPITAL_COMMUNITY)
Admission: RE | Admit: 2023-01-13 | Discharge: 2023-01-13 | Disposition: A | Discharge status: Home / Self Care | Payer: Medicare Other | Source: Ambulatory Visit | Attending: Pulmonary Disease | Admitting: Pulmonary Disease

## 2023-01-13 DIAGNOSIS — R0602 Shortness of breath: Secondary | ICD-10-CM

## 2023-01-13 DIAGNOSIS — U099 Post covid-19 condition, unspecified: Secondary | ICD-10-CM

## 2023-01-13 NOTE — Progress Notes (Signed)
Daily Session Note  Patient Details  Name: Brendan Hopkins MRN: [id number] Date of Birth: 13-Dec-1935 Referring Provider:   Doristine Devoid Pulmonary Rehab Walk Test from 12/10/2022 in [facility], Vascular, & Lung [facility]  Referring Provider [name]       Encounter Date: 01/13/2023  Check In:  Session Check In - 01/13/23 1059       Check-In   Supervising physician immediately available to respond to emergencies [facility] MD immediately available    Physician(s) Neila Gear, NP    Location [facility]    Staff Present Essie Hart, RN, BSN;Casey Katrinka Blazing, Zella Richer, MS, ACSM-CEP, Exercise Physiologist;Randi [facility], ACSM-CEP, Exercise Physiologist    Virtual Visit No    Medication changes reported     No    Fall or balance concerns reported    No    Tobacco Cessation No Change    Warm-up and Cool-down Performed as group-led instruction    Resistance Training Performed Yes    VAD Patient? No    PAD/SET Patient? No      Pain Assessment   Currently in Pain? No/denies    Multiple Pain Sites No             Capillary Blood Glucose: No results found for this or any previous visit (from the past 24 hour(s)).    Social History   Tobacco Use  Smoking Status Former   Current packs/day: 0.00   Average packs/day: 2.0 packs/day for 57.0 years (114.0 ttl pk-yrs)   Types: Cigarettes   Start date: 05/24/1950   Quit date: 05/25/2007   Years since quitting: 15.6  Smokeless Tobacco Never  Tobacco Comments   Does not qualify due to age.     Goals Met:  Independence with exercise equipment Exercise tolerated well No report of concerns or symptoms today Strength training completed today  Goals Unmet:  Not Applicable  Comments: Service time is from 1007 to 1133    Dr. Mechele Collin is Medical Director for Pulmonary Rehab at [facility].

## 2023-01-18 ENCOUNTER — Encounter (HOSPITAL_COMMUNITY)
Admission: RE | Admit: 2023-01-18 | Discharge: 2023-01-18 | Disposition: A | Discharge status: Home / Self Care | Payer: Medicare Other | Source: Ambulatory Visit | Attending: Pulmonary Disease | Admitting: Pulmonary Disease

## 2023-01-18 DIAGNOSIS — R0602 Shortness of breath: Secondary | ICD-10-CM | POA: Diagnosis not present

## 2023-01-18 DIAGNOSIS — U099 Post covid-19 condition, unspecified: Secondary | ICD-10-CM | POA: Diagnosis not present

## 2023-01-18 NOTE — Progress Notes (Signed)
Daily Session Note  Patient Details  Name: Brendan Hopkins MRN: [id number] Date of Birth: 1935/09/04 Referring Provider:   Doristine Devoid Pulmonary Rehab Walk Test from 12/10/2022 in [facility], [facility]  Referring Provider [name]       Encounter Date: 01/18/2023  Check In:  Session Check In - 01/18/23 1224       Check-In   Supervising physician immediately available to respond to emergencies [facility] MD immediately available    Physician(s) Meriel Flavors, NP    [facility]    Staff Present Essie Hart, RN, BSN;Casey Katrinka Blazing, Zella Richer, MS, ACSM-CEP, Exercise Physiologist;Randi Idelle Crouch BS, ACSM-CEP, Exercise Physiologist;Samantha Bel[place], [place], LDN    Virtual Visit No    Medication changes reported     No    Fall or balance concerns reported    No    Tobacco Cessation No Change    Warm-up and Cool-down Performed as group-led instruction    Resistance Training Performed Yes    VAD Patient? No    PAD/SET Patient? No      Pain Assessment   Currently in Pain? No/denies    Pain Score 0-No pain    Multiple Pain Sites No             Capillary Blood Glucose: No results found for this or any previous visit (from the past 24 hour(s)).    Social History   Tobacco Use  Smoking Status Former   Current packs/day: 0.00   Average packs/day: 2.0 packs/day for 57.0 years (114.0 ttl pk-yrs)   Types: Cigarettes   Start date: 05/24/1950   Quit date: 05/25/2007   Years since quitting: 15.6  Smokeless Tobacco Never  Tobacco Comments   Does not qualify due to age.     Goals Met:  Proper associated with RPD/PD & O2 Sat Exercise tolerated well No report of concerns or symptoms today Strength training completed today  Goals Unmet:  Not Applicable  Comments: Service time is from 1009 to 1150.    Dr. Mechele Collin is Medical Director for Pulmonary Rehab at [facility].

## 2023-01-20 ENCOUNTER — Encounter (HOSPITAL_COMMUNITY)
Admission: RE | Admit: 2023-01-20 | Discharge: 2023-01-20 | Disposition: A | Discharge status: Home / Self Care | Payer: Medicare Other | Source: Ambulatory Visit | Attending: Pulmonary Disease | Admitting: Pulmonary Disease

## 2023-01-20 DIAGNOSIS — U099 Post covid-19 condition, unspecified: Secondary | ICD-10-CM

## 2023-01-20 DIAGNOSIS — R0602 Shortness of breath: Secondary | ICD-10-CM | POA: Diagnosis not present

## 2023-01-20 NOTE — Progress Notes (Signed)
Daily Session Note  Patient Details  Name: Brendan Hopkins MRN: [id number] Date of Birth: 02-27-1936 Referring Provider:   Doristine Devoid Pulmonary Rehab Walk Test from 12/10/2022 in [facility]  Referring Provider [name]       Encounter Date: 01/20/2023  Check In:  Session Check In - 01/20/23 1138       Check-In   Supervising physician immediately available to respond to emergencies [facility] MD immediately available    Physician(s) Bernadene Person, NP    [facility]    Staff Present Durel Salts, Zella Richer, MS, ACSM-CEP, Exercise Physiologist;Randi Dionisio Paschal, ACSM-CEP, Exercise Physiologist;Maria Whitaker, RN, BSN    Virtual Visit No    Medication changes reported     No    Fall or balance concerns reported    No    Tobacco Cessation No Change    Warm-up and Cool-down Performed as group-led instruction    Resistance Training Performed Yes    VAD Patient? No    PAD/SET Patient? No      Pain Assessment   Currently in Pain? No/denies             Capillary Blood Glucose: No results found for this or any previous visit (from the past 24 hour(s)).    Social History   Tobacco Use  Smoking Status Former   Current packs/day: 0.00   Average packs/day: 2.0 packs/day for 57.0 years (114.0 ttl pk-yrs)   Types: Cigarettes   Start date: 05/24/1950   Quit date: 05/25/2007   Years since quitting: 15.6  Smokeless Tobacco Never  Tobacco Comments   Does not qualify due to age.     Goals Met:  Proper associated with RPD/PD & O2 Sat Independence with exercise equipment Exercise tolerated well No report of concerns or symptoms today Strength training completed today  Goals Unmet:  Not Applicable  Comments: Service time is from 1008 to 1140.    Dr. Mechele Collin is Medical Director for Pulmonary Rehab at [facility].

## 2023-01-25 ENCOUNTER — Encounter (HOSPITAL_COMMUNITY)
Admission: RE | Admit: 2023-01-25 | Discharge: 2023-01-25 | Disposition: A | Discharge status: Home / Self Care | Payer: Medicare Other | Source: Ambulatory Visit | Attending: Pulmonary Disease | Admitting: Pulmonary Disease

## 2023-01-25 VITALS — Wt 244.7 lb

## 2023-01-25 DIAGNOSIS — J449 Chronic obstructive pulmonary disease, unspecified: Secondary | ICD-10-CM | POA: Diagnosis not present

## 2023-01-25 DIAGNOSIS — R0602 Shortness of breath: Secondary | ICD-10-CM | POA: Insufficient documentation

## 2023-01-25 DIAGNOSIS — U099 Post covid-19 condition, unspecified: Secondary | ICD-10-CM

## 2023-01-25 NOTE — Progress Notes (Signed)
Daily Session Note  Patient Details  Name: Brendan Hopkins MRN: [id number] Date of Birth: Jan 16, 1936 Referring Provider:   Doristine Devoid Pulmonary Rehab Walk Test from 12/10/2022 in [facility], V[facility], & [facility]  Referring Provider [name]       Encounter Date: 01/25/2023  Check In:  Session Check In - 01/25/23 1130       Check-In   Supervising physician immediately available to respond to emergencies [facility] MD immediately available    Physician(s) Bernadene Person, NP    Location [facility]    Staff Present Durel Salts, Zella Richer, MS, ACSM-CEP, Exercise Physiologist;Randi Dionisio Paschal, ACSM-CEP, Exercise Physiologist;Samantha Belarus, RD, [place] Gray, RN, BSN    Virtual Visit No    Medication changes reported     No    Fall or balance concerns reported    No    Tobacco Cessation No Change    Warm-up and Cool-down Performed as group-led Writer Performed Yes    VAD Patient? No    PAD/SET Patient? No      Pain Assessment   Currently in Pain? No/denies    Multiple Pain Sites No             Capillary Blood Glucose: No results found for this or any previous visit (from the past 24 hour(s)).   Exercise Prescription Changes - 01/25/23 1100       Response to Exercise   Blood Pressure (Admit) 118/70    Blood Pressure (Exercise) 142/68    Blood Pressure (Exit) 124/60    Heart Rate (Admit) 68 bpm    Heart Rate (Exercise) 89 bpm    Heart Rate (Exit) 81 bpm    Oxygen Saturation (Admit) 95 %    Oxygen Saturation (Exercise) 94 %    Oxygen Saturation (Exit) 95 %    Rating of Perceived Exertion (Exercise) 11    Perceived Dyspnea (Exercise) 2    Duration Continue with 30 min of aerobic exercise without signs/symptoms of physical distress.    Intensity THRR unchanged      Progression   Progression Continue to progress workloads to maintain intensity without signs/symptoms of physical  distress.      Resistance Training   Training Prescription Yes    Weight blue bands    Reps 10-15    Time 10 Minutes      Oxygen   Oxygen Continuous    Liters 2      NuStep   Level 2    Minutes 15    METs 2.6      Track   Laps 10    Minutes 15    METs 2.54      Oxygen   Maintain Oxygen Saturation 88% or higher             Social History   Tobacco Use  Smoking Status Former   Current packs/day: 0.00   Average packs/day: 2.0 packs/day for 57.0 years (114.0 ttl pk-yrs)   Types: Cigarettes   Start date: 05/24/1950   Quit date: 05/25/2007   Years since quitting: 15.6  Smokeless Tobacco Never  Tobacco Comments   Does not qualify due to age.     Goals Met:  Proper associated with RPD/PD & O2 Sat Independence with exercise equipment Exercise tolerated well No report of concerns or symptoms today Strength training completed today  Goals Unmet:  Not Applicable  Comments: Service time is from 1021  to 1141.    Dr. Mechele Collin is Medical Director for Pulmonary Rehab at [facility].

## 2023-01-26 NOTE — Progress Notes (Signed)
Pulmonary Individual Treatment Plan  Patient Details  Name: Brendan Hopkins MRN: [id number] Date of Birth: Nov 08, 1935 Referring Provider:   Doristine Devoid Pulmonary Rehab Walk Test from 12/10/2022 in [facility]  Referring Provider [name]       Initial Encounter Date:  Flowsheet Row Pulmonary Rehab Walk Test from 12/10/2022 in [facility]  Date 12/10/22       Visit Diagnosis: Shortness of breath  Post covid-19 condition, unspecified  Patient's Home Medications on Admission:   Current Outpatient Medications:    acetaminophen (TYLENOL) 325 MG tablet, Take 325 mg by mouth every evening., Disp: , Rfl:    albuterol (VENTOLIN HFA) 108 (90 Base) MCG/ACT inhaler, Inhale 1 to 2  puffs by mouth every 6 hours as needed for shortness of breath (Patient taking differently: Inhale 1 puff into the lungs every 6 (six) hours as needed for shortness of breath.), Disp: 6.7 each, Rfl: 11   amLODipine (NORVASC) 5 MG tablet, Take 1 tablet by mouth at bedtime, Disp: 90 tablet, Rfl: 0   aspirin 81 MG chewable tablet, Chew 81 mg by mouth at bedtime., Disp: , Rfl:    benzonatate (TESSALON) 200 MG capsule, Take 1 capsule (200 mg total) by mouth 3 (three) times daily as needed for cough., Disp: 20 capsule, Rfl: 0   Fluticasone-Umeclidin-Vilant (TRELEGY ELLIPTA) 200-62.5-25 MCG/ACT AEPB, Inhale 1 puff into the lungs daily., Disp: 60 each, Rfl: 11   Fluticasone-Umeclidin-Vilant (TRELEGY ELLIPTA) 200-62.5-25 MCG/ACT AEPB, Inhale 1 puff into the lungs daily., Disp: 60 each, Rfl: 5   furosemide (LASIX) 40 MG tablet, Take 1 tablet by mouth once daily, Disp: 30 tablet, Rfl: 11   gabapentin (NEURONTIN) 300 MG capsule, TAKE 1 CAPSULE BY MOUTH THREE TIMES DAILY AS NEEDED FOR  SCIATICA (Patient taking differently: Take 300 mg by mouth 2 (two) times daily.), Disp: 270 capsule, Rfl: 2   Iron, Ferrous  Sulfate, 325 (65 Fe) MG TABS, Take 325 mg by mouth daily., Disp: 30 tablet, Rfl: 1   metoprolol succinate (TOPROL-XL) 25 MG 24 hr tablet, Take 1 tablet by mouth once daily, Disp: 90 tablet, Rfl: 0   Multiple Vitamin (MULTIVITAMIN WITH MINERALS) TABS, Take 1 tablet by mouth daily., Disp: , Rfl:    Omega-3 Fatty Acids (FISH OIL) 1200 MG CAPS, Take 1,200 mg by mouth every evening., Disp: , Rfl:    pantoprazole (PROTONIX) 40 MG tablet, Take 1 tablet by mouth every day, Disp: 30 tablet, Rfl: 11   rivaroxaban (XARELTO) 20 MG TABS tablet, Take 1 tablet (20 mg total) by mouth daily., Disp: 90 tablet, Rfl: 1   zolpidem (AMBIEN) 10 MG tablet, Take 1 tablet by mouth at bedtime as needed for sleep, Disp: 30 tablet, Rfl: 5  Past Medical History: Past Medical History:  Diagnosis Date   A-fib (HCC)    Acute deep vein thrombosis (DVT) of tibial vein of right lower extremity (HCC)    after ankle fracture/surgery (2/21)   Arthritis    RIGHT HIP, HANDS, BACK   Cancer (HCC)    Skin cancer    Cataract    Cervical radiculopathy    severe C3-4 neuroforaminal stenosis, C5-6 left synovial cyst compressing left nerve root.   Colon polyps    2005   COPD (chronic obstructive pulmonary disease) (HCC)    SMOKED FOR 69 YRS   Diverticulosis    2005   Fatty liver  GERD (gastroesophageal reflux disease)    Hard of hearing    History of kidney stones    Hypertension    Leg fracture, right    Pain    RIGHT HIP AND BACK   Personal history of colonic polyps-adenoma and sessile serrated polyp 07/15/2010   Pneumonia 03/2019   RBBB    POSS HX AF IN PAST- PAC'S   Shortness of breath    WITH EXERTION ONLY   Stroke (HCC)    TIA   TIA (transient ischemic attack)    Wears glasses     Tobacco Use: Social History   Tobacco Use  Smoking Status Former   Current packs/day: 0.00   Average packs/day: 2.0 packs/day for 57.0 years (114.0 ttl pk-yrs)   Types: Cigarettes   Start date: 05/24/1950   Quit date: 05/25/2007    Years since quitting: 15.6  Smokeless Tobacco Never  Tobacco Comments   Does not qualify due to age.     Labs: Review Flowsheet  More data exists      Latest Ref Rng & Units 12/19/2014 12/14/2016 12/20/2017 01/26/2019 07/03/2022  Labs for ITP Cardiac and Pulmonary Rehab  Cholestrol <200 mg/dL 244  010  272  536  -  LDL (calc) mg/dL (calc) 644  034  742  595  -  HDL-C > OR = 40 mg/dL 52  56  47  46  -  Trlycerides <150 mg/dL 78  70  638  756  -  Hemoglobin A1c <5.7 % of total Hgb - - 6.1  6.0  -  Bicarbonate 20.0 - 28.0 mmol/L - - - - 26.0   TCO2 22 - 32 mmol/L - - - - 28   Acid-base deficit 0.0 - 2.0 mmol/L - - - - 2.0   O2 Saturation % - - - - 87  27.5     Details       Multiple values from one day are sorted in reverse-chronological order         Capillary Blood Glucose: Lab Results  Component Value Date   GLUCAP 101 (H) 12/03/2011   GLUCAP 122 (H) 12/02/2011   GLUCAP 106 (H) 12/02/2011   GLUCAP 105 (H) 12/02/2011   GLUCAP 107 (H) 12/02/2011     Pulmonary Assessment Scores:  Pulmonary Assessment Scores     Row Name 12/10/22 1001         ADL UCSD   ADL Phase Entry     SOB Score total 51       CAT Score   CAT Score 25       mMRC Score   mMRC Score 3             UCSD: Self-administered rating of dyspnea associated with activities of daily living (ADLs) 6-point scale (0 = "not at all" to 5 = "maximal or unable to do because of breathlessness")  Scoring Scores range from 0 to 120.  Minimally important difference is 5 units  CAT: CAT can identify the health impairment of COPD patients and is better correlated with disease progression.  CAT has a scoring range of zero to 40. The CAT score is classified into four groups of low (less than 10), medium (10 - 20), high (21-30) and very high (31-40) based on the impact level of disease on health status. A CAT score over 10 suggests significant symptoms.  A worsening CAT score could be explained by an  exacerbation, poor medication adherence, poor inhaler technique, or progression  of COPD or comorbid conditions.  CAT MCID is 2 points  mMRC: mMRC (Modified Medical Research Council) Dyspnea Scale is used to assess the degree of baseline functional disability in patients of respiratory disease due to dyspnea. No minimal important difference is established. A decrease in score of 1 point or greater is considered a positive change.   Pulmonary Function Assessment:  Pulmonary Function Assessment - 12/10/22 0949       Breath   Bilateral Breath Sounds Rales;Basilar    Shortness of Breath Fear of Shortness of Breath;Limiting activity;Panic with Shortness of Breath;Yes             Exercise Target Goals: Exercise Program Goal: Individual exercise prescription set using results from initial 6 min walk test and THRR while considering  patient's activity barriers and safety.   Exercise Prescription Goal: Initial exercise prescription builds to 30-45 minutes a day of aerobic activity, 2-3 days per week.  Home exercise guidelines will be given to patient during program as part of exercise prescription that the participant will acknowledge.  Activity Barriers & Risk Stratification:  Activity Barriers & Cardiac Risk Stratification - 12/10/22 0939       Activity Barriers & Cardiac Risk Stratification   Activity Barriers Arthritis;Balance Concerns;Deconditioning;Muscular Weakness;History of Falls;Left Hip Replacement;Right Hip Replacement;Shortness of Breath;Back Problems             6 Minute Walk:  6 Minute Walk     Row Name 12/10/22 1101         6 Minute Walk   Phase Initial     Distance 920 feet     Walk Time 6 minutes     # of Rest Breaks 1     MPH 1.74     METS 1.19     RPE 11     Perceived Dyspnea  1     VO2 Peak 4.16     Resting HR 89 bpm     Resting BP 132/62     Resting Oxygen Saturation  89 %     Exercise Oxygen Saturation  during 6 min walk 86 %     Max Ex. HR  100 bpm     Max Ex. BP 160/80     2 Minute Post BP 140/80       Interval HR   1 Minute HR 76     2 Minute HR 85     3 Minute HR 93     4 Minute HR 95     5 Minute HR 100     6 Minute HR 97     2 Minute Post HR 77     Interval Heart Rate? Yes       Interval Oxygen   Interval Oxygen? Yes     Baseline Oxygen Saturation % 93 %     1 Minute Oxygen Saturation % 88 %     1 Minute Liters of Oxygen 0 L     2 Minute Oxygen Saturation % 86 %  applied 2L     2 Minute Liters of Oxygen 2 L     3 Minute Oxygen Saturation % 88 %     3 Minute Liters of Oxygen 2 L     4 Minute Oxygen Saturation % 87 %     4 Minute Liters of Oxygen 2 L     5 Minute Oxygen Saturation % 90 %     5 Minute Liters of Oxygen 2 L  6 Minute Oxygen Saturation % 91 %     6 Minute Liters of Oxygen 2 L     2 Minute Post Oxygen Saturation % 96 %     2 Minute Post Liters of Oxygen 2 L              Oxygen Initial Assessment:  Oxygen Initial Assessment - 12/10/22 0942       Home Oxygen   Home Oxygen Device Home Concentrator;E-Tanks    Sleep Oxygen Prescription None    Home Exercise Oxygen Prescription Continuous   pt wears as needed   Liters per minute 2    Home Resting Oxygen Prescription None    Compliance with Home Oxygen Use Yes      Initial 6 min Walk   Oxygen Used Continuous    Liters per minute 2      Program Oxygen Prescription   Program Oxygen Prescription Continuous    Liters per minute 2    Comments Pt needs 2L for exercise      Intervention   Short Term Goals To learn and exhibit compliance with exercise, home and travel O2 prescription;To learn and understand importance of monitoring SPO2 with pulse oximeter and demonstrate accurate use of the pulse oximeter.;To learn and understand importance of maintaining oxygen saturations>88%;To learn and demonstrate proper pursed lip breathing techniques or other breathing techniques. ;To learn and demonstrate proper use of respiratory medications     Long  Term Goals Exhibits compliance with exercise, home  and travel O2 prescription;Maintenance of O2 saturations>88%;Compliance with respiratory medication;Exhibits proper breathing techniques, such as pursed lip breathing or other method taught during program session;Verbalizes importance of monitoring SPO2 with pulse oximeter and return demonstration;Demonstrates proper use of MDI's             Oxygen Re-Evaluation:  Oxygen Re-Evaluation     Row Name 12/17/22 1133 01/19/23 1128           Program Oxygen Prescription   Program Oxygen Prescription Continuous Continuous      Liters per minute 2 2        Home Oxygen   Home Oxygen Device Home Concentrator;E-Tanks Home Concentrator;E-Tanks      Sleep Oxygen Prescription None None      Home Exercise Oxygen Prescription Continuous  pt wears as needed Continuous  pt wears as needed      Liters per minute 2 2      Home Resting Oxygen Prescription None None      Compliance with Home Oxygen Use Yes Yes        Goals/Expected Outcomes   Short Term Goals To learn and exhibit compliance with exercise, home and travel O2 prescription;To learn and understand importance of monitoring SPO2 with pulse oximeter and demonstrate accurate use of the pulse oximeter.;To learn and understand importance of maintaining oxygen saturations>88%;To learn and demonstrate proper pursed lip breathing techniques or other breathing techniques. ;To learn and demonstrate proper use of respiratory medications To learn and exhibit compliance with exercise, home and travel O2 prescription;To learn and understand importance of monitoring SPO2 with pulse oximeter and demonstrate accurate use of the pulse oximeter.;To learn and understand importance of maintaining oxygen saturations>88%;To learn and demonstrate proper pursed lip breathing techniques or other breathing techniques. ;To learn and demonstrate proper use of respiratory medications      Long  Term Goals Exhibits  compliance with exercise, home  and travel O2 prescription;Maintenance of O2 saturations>88%;Compliance with respiratory medication;Exhibits proper breathing techniques, such as  pursed lip breathing or other method taught during program session;Verbalizes importance of monitoring SPO2 with pulse oximeter and return demonstration;Demonstrates proper use of MDI's Exhibits compliance with exercise, home  and travel O2 prescription;Maintenance of O2 saturations>88%;Compliance with respiratory medication;Exhibits proper breathing techniques, such as pursed lip breathing or other method taught during program session;Verbalizes importance of monitoring SPO2 with pulse oximeter and return demonstration;Demonstrates proper use of MDI's      Goals/Expected Outcomes Compliance and understanding of oxygen saturation monitoring and breathing techniques to decrease shortness of breath. Compliance and understanding of oxygen saturation monitoring and breathing techniques to decrease shortness of breath.               Oxygen Discharge (Final Oxygen Re-Evaluation):  Oxygen Re-Evaluation - 01/19/23 1128       Program Oxygen Prescription   Program Oxygen Prescription Continuous    Liters per minute 2      Home Oxygen   Home Oxygen Device Home Concentrator;E-Tanks    Sleep Oxygen Prescription None    Home Exercise Oxygen Prescription Continuous   pt wears as needed   Liters per minute 2    Home Resting Oxygen Prescription None    Compliance with Home Oxygen Use Yes      Goals/Expected Outcomes   Short Term Goals To learn and exhibit compliance with exercise, home and travel O2 prescription;To learn and understand importance of monitoring SPO2 with pulse oximeter and demonstrate accurate use of the pulse oximeter.;To learn and understand importance of maintaining oxygen saturations>88%;To learn and demonstrate proper pursed lip breathing techniques or other breathing techniques. ;To learn and demonstrate proper  use of respiratory medications    Long  Term Goals Exhibits compliance with exercise, home  and travel O2 prescription;Maintenance of O2 saturations>88%;Compliance with respiratory medication;Exhibits proper breathing techniques, such as pursed lip breathing or other method taught during program session;Verbalizes importance of monitoring SPO2 with pulse oximeter and return demonstration;Demonstrates proper use of MDI's    Goals/Expected Outcomes Compliance and understanding of oxygen saturation monitoring and breathing techniques to decrease shortness of breath.             Initial Exercise Prescription:  Initial Exercise Prescription - 12/10/22 1200       Date of Initial Exercise RX and Referring Provider   Date 12/10/22    Referring Provider [name]    Expected Discharge Date 03/10/23      Oxygen   Oxygen Continuous    Liters 2    Maintain Oxygen Saturation 88% or higher      NuStep   Level 2    SPM 60    Minutes 15    METs 2      Track   Laps 12    Minutes 15    METs 1.5      Prescription Details   Frequency (times per week) 2    Duration Progress to 30 minutes of continuous aerobic without signs/symptoms of physical distress      Intensity   THRR 40-80% of Max Heartrate 54-109    Ratings of Perceived Exertion 11-13    Perceived Dyspnea 0-4      Progression   Progression Continue to progress workloads to maintain intensity without signs/symptoms of physical distress.      Resistance Training   Training Prescription Yes    Weight blue bands    Reps 10-15             Perform Capillary Blood Glucose checks as needed.  Exercise  Prescription Changes:   Exercise Prescription Changes     Row Name 12/28/22 1200 01/11/23 1100 01/25/23 1100         Response to Exercise   Blood Pressure (Admit) 118/64 120/64 118/70     Blood Pressure (Exercise) 150/70 148/70 142/68     Blood Pressure (Exit) 110/62 128/68 124/60     Heart Rate (Admit) 76 bpm 70 bpm 68  bpm     Heart Rate (Exercise) 96 bpm 92 bpm 89 bpm     Heart Rate (Exit) 79 bpm 71 bpm 81 bpm     Oxygen Saturation (Admit) 93 % 96 % 95 %     Oxygen Saturation (Exercise) 92 % 92 % 94 %     Oxygen Saturation (Exit) 94 % 95 % 95 %     Rating of Perceived Exertion (Exercise) 15 13 11      Perceived Dyspnea (Exercise) 2 2 2      Duration Continue with 30 min of aerobic exercise without signs/symptoms of physical distress. Continue with 30 min of aerobic exercise without signs/symptoms of physical distress. Continue with 30 min of aerobic exercise without signs/symptoms of physical distress.     Intensity THRR unchanged THRR unchanged THRR unchanged       Progression   Progression Continue to progress workloads to maintain intensity without signs/symptoms of physical distress. Continue to progress workloads to maintain intensity without signs/symptoms of physical distress. Continue to progress workloads to maintain intensity without signs/symptoms of physical distress.       Resistance Training   Training Prescription Yes Yes Yes     Weight blue bands blue bands blue bands     Reps 10-15 10-15 10-15     Time 10 Minutes 10 Minutes 10 Minutes       Oxygen   Oxygen Continuous Continuous Continuous     Liters 2 2 2        NuStep   Level 1 2 2      SPM -- 93 --     Minutes 15 15 15      METs 2.1 2.8 2.6       Track   Laps 10 11 10      Minutes 15 15 15      METs 2.54 2.69 2.54       Oxygen   Maintain Oxygen Saturation 88% or higher 88% or higher 88% or higher              Exercise Comments:   Exercise Comments     Row Name 12/16/22 1617           Exercise Comments Pt completed his first day of exercise. Brendan Hopkins exercised for 15 min on the [employer] and track. He averaged 1.9 METs at level 1 on the N[employer] and 2.08 METs on the track. Brendan Hopkins performed the warmup and cooldown standing. He does have orthopedic limitations so he skips certain exercises or does alternatives. Will go over  more alternative exercises. Discussed METs and how to increase METs. Unsure if Brendan Hopkins was retaining what I was saying.                Exercise Goals and Review:   Exercise Goals     Row Name 12/10/22 0940 12/17/22 1104 01/19/23 1122         Exercise Goals   Increase Physical Activity Yes Yes Yes     Intervention Provide advice, education, support and counseling about physical activity/exercise needs.;Develop an individualized exercise prescription for aerobic and  resistive training based on initial evaluation findings, risk stratification, comorbidities and participant's personal goals. Provide advice, education, support and counseling about physical activity/exercise needs.;Develop an individualized exercise prescription for aerobic and resistive training based on initial evaluation findings, risk stratification, comorbidities and participant's personal goals. Provide advice, education, support and counseling about physical activity/exercise needs.;Develop an individualized exercise prescription for aerobic and resistive training based on initial evaluation findings, risk stratification, comorbidities and participant's personal goals.     Expected Outcomes Short Term: Attend rehab on a regular basis to increase amount of physical activity.;Long Term: Add in home exercise to make exercise part of routine and to increase amount of physical activity. Short Term: Attend rehab on a regular basis to increase amount of physical activity.;Long Term: Add in home exercise to make exercise part of routine and to increase amount of physical activity. Short Term: Attend rehab on a regular basis to increase amount of physical activity.;Long Term: Add in home exercise to make exercise part of routine and to increase amount of physical activity.;Long Term: Exercising regularly at least 3-5 days a week.     Increase Strength and Stamina Yes Yes Yes     Intervention Provide advice, education, support and  counseling about physical activity/exercise needs.;Develop an individualized exercise prescription for aerobic and resistive training based on initial evaluation findings, risk stratification, comorbidities and participant's personal goals. Provide advice, education, support and counseling about physical activity/exercise needs.;Develop an individualized exercise prescription for aerobic and resistive training based on initial evaluation findings, risk stratification, comorbidities and participant's personal goals. Provide advice, education, support and counseling about physical activity/exercise needs.;Develop an individualized exercise prescription for aerobic and resistive training based on initial evaluation findings, risk stratification, comorbidities and participant's personal goals.     Expected Outcomes Short Term: Perform resistance training exercises routinely during rehab and add in resistance training at home;Short Term: Increase workloads from initial exercise prescription for resistance, speed, and METs.;Long Term: Improve cardiorespiratory fitness, muscular endurance and strength as measured by increased METs and functional capacity ( ) Short Term: Perform resistance training exercises routinely during rehab and add in resistance training at home;Short Term: Increase workloads from initial exercise prescription for resistance, speed, and METs.;Long Term: Improve cardiorespiratory fitness, muscular endurance and strength as measured by increased METs and functional capacity ( ) Short Term: Perform resistance training exercises routinely during rehab and add in resistance training at home;Short Term: Increase workloads from initial exercise prescription for resistance, speed, and METs.;Long Term: Improve cardiorespiratory fitness, muscular endurance and strength as measured by increased METs and functional capacity ( )     Able to understand and use rate of perceived exertion (RPE) scale Yes  Yes Yes     Intervention Provide education and explanation on how to use RPE scale Provide education and explanation on how to use RPE scale Provide education and explanation on how to use RPE scale     Expected Outcomes Short Term: Able to use RPE daily in rehab to express subjective intensity level;Long Term:  Able to use RPE to guide intensity level when exercising independently Short Term: Able to use RPE daily in rehab to express subjective intensity level;Long Term:  Able to use RPE to guide intensity level when exercising independently Short Term: Able to use RPE daily in rehab to express subjective intensity level;Long Term:  Able to use RPE to guide intensity level when exercising independently     Able to understand and use Dyspnea scale Yes Yes Yes     Intervention Provide education  and explanation on how to use Dyspnea scale Provide education and explanation on how to use Dyspnea scale Provide education and explanation on how to use Dyspnea scale     Expected Outcomes Short Term: Able to use Dyspnea scale daily in rehab to express subjective sense of shortness of breath during exertion;Long Term: Able to use Dyspnea scale to guide intensity level when exercising independently Short Term: Able to use Dyspnea scale daily in rehab to express subjective sense of shortness of breath during exertion;Long Term: Able to use Dyspnea scale to guide intensity level when exercising independently Short Term: Able to use Dyspnea scale daily in rehab to express subjective sense of shortness of breath during exertion;Long Term: Able to use Dyspnea scale to guide intensity level when exercising independently     Knowledge and understanding of Target Heart Rate Range (THRR) Yes Yes Yes     Intervention Provide education and explanation of THRR including how the numbers were predicted and where they are located for reference Provide education and explanation of THRR including how the numbers were predicted and where  they are located for reference Provide education and explanation of THRR including how the numbers were predicted and where they are located for reference     Expected Outcomes Short Term: Able to state/look up THRR;Long Term: Able to use THRR to govern intensity when exercising independently;Short Term: Able to use daily as guideline for intensity in rehab Short Term: Able to state/look up THRR;Long Term: Able to use THRR to govern intensity when exercising independently;Short Term: Able to use daily as guideline for intensity in rehab Short Term: Able to state/look up THRR;Long Term: Able to use THRR to govern intensity when exercising independently;Short Term: Able to use daily as guideline for intensity in rehab     Understanding of Exercise Prescription Yes Yes Yes     Intervention Provide education, explanation, and written materials on patient's individual exercise prescription Provide education, explanation, and written materials on patient's individual exercise prescription Provide education, explanation, and written materials on patient's individual exercise prescription     Expected Outcomes Short Term: Able to explain program exercise prescription;Long Term: Able to explain home exercise prescription to exercise independently Short Term: Able to explain program exercise prescription;Long Term: Able to explain home exercise prescription to exercise independently Short Term: Able to explain program exercise prescription;Long Term: Able to explain home exercise prescription to exercise independently              Exercise Goals Re-Evaluation :  Exercise Goals Re-Evaluation     Row Name 12/17/22 1104 01/19/23 1122           Exercise Goal Re-Evaluation   Exercise Goals Review Increase Physical Activity;Able to understand and use Dyspnea scale;Understanding of Exercise Prescription;Increase Strength and Stamina;Knowledge and understanding of Target Heart Rate Range (THRR);Able to understand  and use rate of perceived exertion (RPE) scale Increase Physical Activity;Able to understand and use Dyspnea scale;Understanding of Exercise Prescription;Increase Strength and Stamina;Knowledge and understanding of Target Heart Rate Range (THRR);Able to understand and use rate of perceived exertion (RPE) scale      Comments Brendan Hopkins has completed 1 exercise session. He exercises for 15 min on the Nustep and track. He averages 1.9 METs at level 1 on the Nustep and 2.08 METs on the track. He performs the warmup and cooldown standing/seated dependent on his orthopedic limitations. Brendan Hopkins need verbal cues and alternative exercises when doing the cooldown. It is too soon to note any discernable progressions.  Will continue to monitor and progress as able. Brendan Hopkins has completed 10 exercise sessions. He exercises for 15 min on the Nustep and track. He averages 2.6 METs at level 3 on the Nustep and 2.54 METs on the track. He performs the warmup and cooldown standing/seated dependent on his orthopedic limitations. Brendan Hopkins understands the warmup and cooldown without verbal cues now. He has increased his workload for both exercise modes. His level on the Nustep has increased from 1-3 and laps on the track have increased from 8 to 11. Brendan Hopkins tolerates progressions well. Will continue to monitor and progress as able.      Expected Outcomes Through exercise at rehab and home, the patient will decrease shortness of breath with daily activities and feel confident in carrying out an exercise regimen at home. Through exercise at rehab and home, the patient will decrease shortness of breath with daily activities and feel confident in carrying out an exercise regimen at home.               Discharge Exercise Prescription (Final Exercise Prescription Changes):  Exercise Prescription Changes - 01/25/23 1100       Response to Exercise   Blood Pressure (Admit) 118/70    Blood Pressure (Exercise) 142/68    Blood Pressure  (Exit) 124/60    Heart Rate (Admit) 68 bpm    Heart Rate (Exercise) 89 bpm    Heart Rate (Exit) 81 bpm    Oxygen Saturation (Admit) 95 %    Oxygen Saturation (Exercise) 94 %    Oxygen Saturation (Exit) 95 %    Rating of Perceived Exertion (Exercise) 11    Perceived Dyspnea (Exercise) 2    Duration Continue with 30 min of aerobic exercise without signs/symptoms of physical distress.    Intensity THRR unchanged      Progression   Progression Continue to progress workloads to maintain intensity without signs/symptoms of physical distress.      Resistance Training   Training Prescription Yes    Weight blue bands    Reps 10-15    Time 10 Minutes      Oxygen   Oxygen Continuous    Liters 2      NuStep   Level 2    Minutes 15    METs 2.6      Track   Laps 10    Minutes 15    METs 2.54      Oxygen   Maintain Oxygen Saturation 88% or higher             Nutrition:  Target Goals: Understanding of nutrition guidelines, daily intake of sodium 1500mg , cholesterol 200mg , calories 30% from fat and 7% or less from saturated fats, daily to have 5 or more servings of fruits and vegetables.  Biometrics:  Pre Biometrics - 12/10/22 1057       Pre Biometrics   Grip Strength 46 kg              Nutrition Therapy Plan and Nutrition Goals:  Nutrition Therapy & Goals - 01/13/23 1147       Nutrition Therapy   Diet Heart Healthy Diet      Personal Nutrition Goals   Nutrition Goal Patient to improve diet quality by using the plate method as a guide for meal planning to include lean protein/plant protein, fruits, vegetables, whole grains, nonfat dairy as part of a well-balanced diet.   in progress.   Personal Goal #2 Patient to identify strategies for weight loss  of 0.5-2.0# per week.   in progress.   Comments Goals in progress. Brendan Hopkins enjoys a wide variety of foods including fruits and vegetables. While he does cook often, he does buy many convenience foods such as frozen  chicken pot pie, etc. He is motivated to lose weight. We discussed strategies for weight losss including decreasing calories from fat and carbohydrates, increasing non-starchy vegetable intake, elminating sugary beverages, etc. He has maintained his weight since starting with our program. Patient will benefit from participation in pulmonary rehab for nutrition, exercise, and lifestyle modification.      Intervention Plan   Intervention Prescribe, educate and counsel regarding individualized specific dietary modifications aiming towards targeted core components such as weight, hypertension, lipid management, diabetes, heart failure and other comorbidities.;Nutrition handout(s) given to patient.    Expected Outcomes Short Term Goal: Understand basic principles of dietary content, such as calories, fat, sodium, cholesterol and nutrients.;Long Term Goal: Adherence to prescribed nutrition plan.             Nutrition Assessments:  Nutrition Assessments - 12/16/22 1123       Rate Your Plate Scores   Pre Score 58            MEDIFICTS Score Key: ?70 Need to make dietary changes  40-70 Heart Healthy Diet ? 40 Therapeutic Level Cholesterol Diet  Flowsheet Row PULMONARY REHAB CHRONIC OBSTRUCTIVE PULMONARY DISEASE from 12/16/2022 in [facility]  Picture Your Plate Total Score on Admission 58      Picture Your Plate Scores: <62 Unhealthy dietary pattern with much room for improvement. 41-50 Dietary pattern unlikely to meet recommendations for good health and room for improvement. 51-60 More healthful dietary pattern, with some room for improvement.  >60 Healthy dietary pattern, although there may be some specific behaviors that could be improved.    Nutrition Goals Re-Evaluation:  Nutrition Goals Re-Evaluation     Row Name 12/16/22 1119 01/13/23 1147           Goals   Current Weight 246 lb 0.5 oz (111.6 kg) 246 lb 7.6 oz  (111.8 kg)      Comment ferritin 11.8 (prescribed ferrous sulfate 325mg ), No recent A1c or lipid panel (most recent from 2020) no new labs; most recent labs ferritin 11.8 (prescribed ferrous sulfate 325mg ), No recent A1c or lipid panel (most recent from 2020)      Expected Outcome Patient will benefit from participation in pulmonary rehab for nutrition, exercise, and lifestyle modification. Goals in progress. Brendan Hopkins enjoys a wide variety of foods including fruits and vegetables. While he does cook often, he does buy many convenience foods such as frozen chicken pot pie, etc. He is motivated to lose weight. We discussed strategies for weight losss including decreasing calories from fat and carbohydrates, increasing non-starchy vegetable intake, elminating sugary beverages, etc. He has maintained his weight since starting with our program. Patient will benefit from participation in pulmonary rehab for nutrition, exercise, and lifestyle modification.               Nutrition Goals Discharge (Final Nutrition Goals Re-Evaluation):  Nutrition Goals Re-Evaluation - 01/13/23 1147       Goals   Current Weight 246 lb 7.6 oz (111.8 kg)    Comment no new labs; most recent labs ferritin 11.8 (prescribed ferrous sulfate 325mg ), No recent A1c or lipid panel (most recent from 2020)    Expected Outcome Goals in progress. 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 While he does cook often, he does buy many convenience foods such as frozen chicken pot pie, etc. He is motivated to lose weight. We discussed strategies for weight losss including decreasing calories from fat and carbohydrates, increasing non-starchy vegetable intake, elminating sugary beverages, etc. He has maintained his weight since starting with our program. Patient will benefit from participation in pulmonary rehab for nutrition, exercise, and lifestyle modification.             Psychosocial: Target Goals:  Acknowledge presence or absence of significant depression and/or stress, maximize coping skills, provide positive support system. Participant is able to verbalize types and ability to use techniques and skills needed for reducing stress and depression.  Initial Review & Psychosocial Screening:  Initial Psych Review & Screening - 12/10/22 0932       Initial Review   Current issues with None Identified      Family Dynamics   Good Support System? Yes      Barriers   Psychosocial barriers to participate in program There are no identifiable barriers or psychosocial needs.             Quality of Life Scores:  Scores of 19 and below usually indicate a poorer quality of life in these areas.  A difference of  2-3 points is a clinically meaningful difference.  A difference of 2-3 points in the total score of the Quality of Life Index has been associated with significant improvement in overall quality of life, self-image, physical symptoms, and general health in studies assessing change in quality of life.  PHQ-9: Review Flowsheet  More data exists      12/10/2022 09/07/2022 06/22/2022 03/26/2021 03/11/2020  Depression screen PHQ 2/9  Decreased Interest 0 0 0 0 0  Down, Depressed, Hopeless 0 0 0 0 0  PHQ - 2 Score 0 0 0 0 0  Altered sleeping 0 - 3 - -  Tired, decreased energy 1 - 3 - -  Change in appetite 1 - 3 - -  Feeling bad or failure about yourself  0 - 0 - -  Trouble concentrating 0 - 0 - -  Moving slowly or fidgety/restless 0 - 0 - -  Suicidal thoughts 0 - 0 - -  PHQ-9 Score 2 - 9 - -  Difficult doing work/chores Not difficult at all - Somewhat difficult - -    Details           Interpretation of Total Score  Total Score Depression Severity:  1-4 = Minimal depression, 5-9 = Mild depression, 10-14 = Moderate depression, 15-19 = Moderately severe depression, 20-27 = Severe depression   Psychosocial Evaluation and Intervention:  Psychosocial Evaluation - 12/10/22 0933        Psychosocial Evaluation & Interventions   Interventions Encouraged to exercise with the program and follow exercise prescription    Comments Ethanjames denies any psychosocial barriers or concerns at this time    Expected Outcomes For Brendan Hopkins to participate in PR free of any psychosocial barriers or concerns    Continue Psychosocial Services  No Follow up required             Psychosocial Re-Evaluation:  Psychosocial Re-Evaluation     Row Name 12/24/22 1558 01/17/23 1554           Psychosocial Re-Evaluation   Current issues with None Identified None Identified      Comments Brendan Hopkins denies any psychosocial barriers or concerns at this time.  Brendan Hopkins denies any psychosocial barriers or concerns at this time.      Expected Outcomes For Brendan Hopkins to participate in PR free of any psychosocial barriers or concerns For Brendan Hopkins to participate in PR free of any psychosocial barriers or concerns      Interventions Encouraged to attend Pulmonary Rehabilitation for the exercise Encouraged to attend Pulmonary Rehabilitation for the exercise      Continue Psychosocial Services  No Follow up required No Follow up required               Psychosocial Discharge (Final Psychosocial Re-Evaluation):  Psychosocial Re-Evaluation - 01/17/23 1554       Psychosocial Re-Evaluation   Current issues with None Identified    Comments Brendan Hopkins denies any psychosocial barriers or concerns at this time.    Expected Outcomes For Brendan Hopkins to participate in PR free of any psychosocial barriers or concerns    Interventions Encouraged to attend Pulmonary Rehabilitation for the exercise    Continue Psychosocial Services  No Follow up required             Education: Education Goals: Education classes will be provided on a weekly basis, covering required topics. Participant will state understanding/return demonstration of topics presented.  Learning Barriers/Preferences:  Learning Barriers/Preferences -  12/10/22 0934       Learning Barriers/Preferences   Learning Barriers Sight;Hearing    Learning Preferences Group Instruction;Individual Instruction             Education Topics: Introduction to Pulmonary Rehab Group instruction provided by PowerPoint, verbal discussion, and written material to support subject matter. Instructor reviews what Pulmonary Rehab is, the purpose of the program, and how patients are referred.     Know Your Numbers Group instruction that is supported by a PowerPoint presentation. Instructor discusses importance of knowing and understanding resting, exercise, and post-exercise oxygen saturation, heart rate, and blood pressure. Oxygen saturation, heart rate, blood pressure, rating of perceived exertion, and dyspnea are reviewed along with a normal range for these values.    Exercise for the Pulmonary Patient Group instruction that is supported by a PowerPoint presentation. Instructor discusses benefits of exercise, core components of exercise, frequency, duration, and intensity of an exercise routine, importance of utilizing pulse oximetry during exercise, safety while exercising, and options of places to exercise outside of rehab.       MET Level  Group instruction provided by PowerPoint, verbal discussion, and written material to support subject matter. Instructor reviews what METs are and how to increase METs.  Flowsheet Row PULMONARY REHAB CHRONIC OBSTRUCTIVE PULMONARY DISEASE from 01/20/2023 in [facility], [facility]  Date 01/20/23  Educator EP  Instruction Review Code 1- Verbalizes Understanding       Pulmonary Medications Verbally interactive group education provided by instructor with focus on inhaled medications and proper administration.   Anatomy and Physiology of the Respiratory System Group instruction provided by PowerPoint, verbal discussion, and written material to support subject  matter. Instructor reviews respiratory cycle and anatomical components of the respiratory system and their functions. Instructor also reviews differences in obstructive and restrictive respiratory diseases with examples of each.    Oxygen Safety Group instruction provided by PowerPoint, verbal discussion, and written material to support subject matter. There is an overview of "What is Oxygen" and "Why do we need it".  Instructor also reviews how to create a safe environment for oxygen use, the importance of using oxygen as prescribed, and the risks  of noncompliance. There is a brief discussion on traveling with oxygen and resources the patient may utilize.   Oxygen Use Group instruction provided by PowerPoint, verbal discussion, and written material to discuss how supplemental oxygen is prescribed and different types of oxygen supply systems. Resources for more information are provided.  Flowsheet Row PULMONARY REHAB CHRONIC OBSTRUCTIVE PULMONARY DISEASE from 12/16/2022 in [facility], V[facility], & Lung [facility]  Date 12/16/22  Educator RT  Instruction Review Code 1- Verbalizes Understanding       Breathing Techniques Group instruction that is supported by demonstration and informational handouts. Instructor discusses the benefits of pursed lip and diaphragmatic breathing and detailed demonstration on how to perform both.  Flowsheet Row PULMONARY REHAB CHRONIC OBSTRUCTIVE PULMONARY DISEASE from 12/23/2022 in [facility], Vascular, [facility]  Date 12/23/22  Educator RN  Instruction Review Code 1- Verbalizes Understanding        Risk Factor Reduction Group instruction that is supported by a PowerPoint presentation. Instructor discusses the definition of a risk factor, different risk factors for pulmonary disease, and how the heart and lungs work together. Flowsheet Row PULMONARY REHAB CHRONIC OBSTRUCTIVE PULMONARY DISEASE  from 01/13/2023 in [facility] for Heart, Vascular, & Lung Health  Date 01/13/23  Educator EP  Instruction Review Code 1- Verbalizes Understanding       MD Day A group question and answer session with a medical doctor that allows participants to ask questions that relate to their pulmonary disease state.   Nutrition for the Pulmonary Patient Group instruction provided by PowerPoint slides, verbal discussion, and written materials to support subject matter. The instructor gives an explanation and review of healthy diet recommendations, which includes a discussion on weight management, recommendations for fruit and vegetable consumption, as well as protein, fluid, caffeine, fiber, sodium, sugar, and alcohol. Tips for eating when patients are short of breath are discussed.    Other Education Group or individual verbal, written, or video instructions that support the educational goals of the pulmonary rehab program. Flowsheet Row PULMONARY REHAB CHRONIC OBSTRUCTIVE PULMONARY DISEASE from 01/06/2023 in [facility], Vascular, & Lung [facility]  Date 01/06/23  Educator RN  Instruction Review Code 1- Verbalizes Understanding        Knowledge Questionnaire Score:  Knowledge Questionnaire Score - 12/10/22 1001       Knowledge Questionnaire Score   Pre Score 15/18             Core Components/Risk Factors/Patient Goals at Admission:  Personal Goals and Risk Factors at Admission - 12/10/22 0935       Core Components/Risk Factors/Patient Goals on Admission    Weight Management Yes;Weight Loss    Intervention Weight Management: Develop a combined nutrition and exercise program designed to reach desired caloric intake, while maintaining appropriate intake of nutrient and fiber, sodium and fats, and appropriate energy expenditure required for the weight goal.;Weight Management: Provide education and appropriate resources to help  participant work on and attain dietary goals.;Weight Management/Obesity: Establish reasonable short term and long term weight goals.;Obesity: Provide education and appropriate resources to help participant work on and attain dietary goals.    Expected Outcomes Short Term: Continue to assess and modify interventions until short term weight is achieved;Long Term: Adherence to nutrition and physical activity/exercise program aimed toward attainment of established weight goal;Weight Maintenance: Understanding of the daily nutrition guidelines, which includes 25-35% calories from fat, 7% or  less cal from saturated fats, less than 200mg  cholesterol, less than 1.5gm of sodium, & 5 or more servings of fruits and vegetables daily;Weight Loss: Understanding of general recommendations for a balanced deficit meal plan, which promotes 1-2 lb weight loss per week and includes a negative energy balance of [phone] kcal/d;Understanding recommendations for meals to include 15-35% energy as protein, 25-35% energy from fat, 35-60% energy from carbohydrates, less than 200mg  of dietary cholesterol, 20-35 gm of total fiber daily;Understanding of distribution of calorie intake throughout the day with the consumption of 4-5 meals/snacks    Improve shortness of breath with ADL's Yes    Intervention Provide education, individualized exercise plan and daily activity instruction to help decrease symptoms of SOB with activities of daily living.    Expected Outcomes Short Term: Improve cardiorespiratory fitness to achieve a reduction of symptoms when performing ADLs;Long Term: Be able to perform more ADLs without symptoms or delay the onset of symptoms    Increase knowledge of respiratory medications and ability to use respiratory devices properly  Yes    Expected Outcomes Long Term: Maintain appropriate use of medications, inhalers, and oxygen therapy.;Short Term: Achieves understanding of medications use. Understands that oxygen is a  medication prescribed by physician. Demonstrates appropriate use of inhaler and oxygen therapy.             Core Components/Risk Factors/Patient Goals Review:   Goals and Risk Factor Review     Row Name 12/24/22 1559 01/17/23 1554           Core Components/Risk Factors/Patient Goals Review   Personal Goals Review Weight Management/Obesity;Improve shortness of breath with ADL's;Develop more efficient breathing techniques such as purse lipped breathing and diaphragmatic breathing and practicing self-pacing with activity.;Increase knowledge of respiratory medications and ability to use respiratory devices properly. Weight Management/Obesity;Improve shortness of breath with ADL's;Develop more efficient breathing techniques such as purse lipped breathing and diaphragmatic breathing and practicing self-pacing with activity.      Review Goal progressing for weight loss. Anival has been working with our dietician for weight loss goals. Goal progressing on improving her shortness of breath with ADLs. Goal progressing on developing more efficient breathing techniques such as purse lipped breathing and diaphragmatic breathing; and practicing self-pacing with activity.He has to be reminded to use purse lip breathing when he gets short of breath.  Goal progressing for increasing knowledge of respiratory medications and ability to use respiratory devices properly. We will continue to monitor Brendan Hopkins's progress throughout the program. Goal progressing for weight loss. Brendan Hopkins has been working with our dietician for weight loss goals. Goal progressing on improving her shortness of breath with ADLs. Goal progressing on developing more efficient breathing techniques such as purse lipped breathing and diaphragmatic breathing; and practicing self-pacing with activity.He has to be reminded to use purse lip breathing when he gets short of breath. Goal met for increasing knowledge of respiratory medications and ability  to use respiratory devices properly. We will continue to monitor Brendan Hopkins's progress throughout the program.      Expected Outcomes See admission goals See admission goals               Core Components/Risk Factors/Patient Goals at Discharge (Final Review):   Goals and Risk Factor Review - 01/17/23 1554       Core Components/Risk Factors/Patient Goals Review   Personal Goals Review Weight Management/Obesity;Improve shortness of breath with ADL's;Develop more efficient breathing techniques such as purse lipped breathing and diaphragmatic breathing and practicing self-pacing with activity.  Review Goal progressing for weight loss. Layth has been working with our dietician for weight loss goals. Goal progressing on improving her shortness of breath with ADLs. Goal progressing on developing more efficient breathing techniques such as purse lipped breathing and diaphragmatic breathing; and practicing self-pacing with activity.He has to be reminded to use purse lip breathing when he gets short of breath. Goal met for increasing knowledge of respiratory medications and ability to use respiratory devices properly. We will continue to monitor Ziair's progress throughout the program.    Expected Outcomes See admission goals             ITP Comments:Pt is making expected progress toward Pulmonary Rehab goals after completing 12 sessions. Recommend continued exercise, life style modification, education, and utilization of breathing techniques to increase stamina and strength, while also decreasing shortness of breath with exertion.  Dr. Mechele Collin is Medical Director for Pulmonary Rehab at [facility].     Comments: Dr. Mechele Collin is Medical Director for Pulmonary Rehab at [facility].

## 2023-01-27 ENCOUNTER — Encounter (HOSPITAL_COMMUNITY)
Admission: RE | Admit: 2023-01-27 | Discharge: 2023-01-27 | Disposition: A | Discharge status: Home / Self Care | Payer: Medicare Other | Source: Ambulatory Visit | Attending: Pulmonary Disease | Admitting: Pulmonary Disease

## 2023-01-27 DIAGNOSIS — U099 Post covid-19 condition, unspecified: Secondary | ICD-10-CM

## 2023-01-27 DIAGNOSIS — R0602 Shortness of breath: Secondary | ICD-10-CM | POA: Diagnosis not present

## 2023-01-27 DIAGNOSIS — J449 Chronic obstructive pulmonary disease, unspecified: Secondary | ICD-10-CM | POA: Diagnosis not present

## 2023-01-27 NOTE — Progress Notes (Signed)
Daily Session Note  Patient Details  Name: Brendan Hopkins MRN: [id number] Date of Birth: 1936/03/13 Referring Provider:   Doristine Devoid Pulmonary Rehab Walk Test from 12/10/2022 in [facility] for Heart, Vascular, & Lung Health  Referring Provider [name]       Encounter Date: 01/27/2023  Check In:  Session Check In - 01/27/23 1145       Check-In   Supervising physician immediately available to respond to emergencies [facility] MD immediately available    Physician(s) Elizabeth Sauer, NP    Location [facility]    Staff Present Durel Salts, Zella Richer, MS, ACSM-CEP, Exercise Physiologist;Randi Dionisio Paschal, ACSM-CEP, Exercise Physiologist;Samantha Belarus, RD, Dutch Gray, RN, BSN    Virtual Visit No    Medication changes reported     No    Fall or balance concerns reported    No    Tobacco Cessation No Change    Warm-up and Cool-down Performed as group-led instruction    Resistance Training Performed Yes    VAD Patient? No    PAD/SET Patient? No             Capillary Blood Glucose: No results found for this or any previous visit (from the past 24 hour(s)).    Social History   Tobacco Use  Smoking Status Former   Current packs/day: 0.00   Average packs/day: 2.0 packs/day for 57.0 years (114.0 ttl pk-yrs)   Types: Cigarettes   Start date: 05/24/1950   Quit date: 05/25/2007   Years since quitting: 15.6  Smokeless Tobacco Never  Tobacco Comments   Does not qualify due to age.     Goals Met:  Proper associated with RPD/PD & O2 Sat Independence with exercise equipment Exercise tolerated well No report of concerns or symptoms today Strength training completed today  Goals Unmet:  Not Applicable  Comments: Service time is from 1017 to 1204.    Dr. Mechele Collin is Medical Director for Pulmonary Rehab at [facility].

## 2023-02-01 ENCOUNTER — Encounter (HOSPITAL_COMMUNITY)
Admission: RE | Admit: 2023-02-01 | Discharge: 2023-02-01 | Disposition: A | Discharge status: Home / Self Care | Payer: Medicare Other | Source: Ambulatory Visit | Attending: Pulmonary Disease | Admitting: Pulmonary Disease

## 2023-02-01 DIAGNOSIS — U099 Post covid-19 condition, unspecified: Secondary | ICD-10-CM

## 2023-02-01 DIAGNOSIS — J449 Chronic obstructive pulmonary disease, unspecified: Secondary | ICD-10-CM | POA: Diagnosis not present

## 2023-02-01 DIAGNOSIS — R0602 Shortness of breath: Secondary | ICD-10-CM | POA: Diagnosis not present

## 2023-02-01 NOTE — Progress Notes (Signed)
Daily Session Note  Patient Details  Name: Brendan Hopkins MRN: [id number] Date of Birth: [birth year] Referring Provider:   Doristine Devoid Pulmonary Rehab Walk Test from 12/10/2022 in [facility] for Heart, Vascular, & Lung Health  Referring Provider [name]       Encounter Date: 02/01/2023  Check In:  Session Check In - 02/01/23 1212       Check-In   Supervising physician immediately available to respond to emergencies [facility] immediately available    Physician(s) Elizabeth Sauer, NP    [facility]    Staff Present Durel Salts, Zella Richer, MS, ACSM-CEP, Exercise Physiologist;Randi Dionisio Paschal, ACSM-CEP, Exercise Physiologist;Samantha Belarus, RD, Dutch Gray, RN, BSN    Virtual Visit No    Medication changes reported     No    Fall or balance concerns reported    No    Tobacco Cessation No Change    Warm-up and Cool-down Performed as group-led instruction    Resistance Training Performed Yes    VAD Patient? No    PAD/SET Patient? No      Pain Assessment   Currently in Pain? No/denies             Capillary Blood Glucose: No results found for this or any previous visit (from the past 24 hour(s)).    Social History   Tobacco Use  Smoking Status Former   Current packs/day: 0.00   Average packs/day: 2.0 packs/day for 57.0 years (114.0 ttl pk-yrs)   Types: Cigarettes   Start date: 05/24/1950   Quit date: 05/25/2007   Years since quitting: 15.7  Smokeless Tobacco Never  Tobacco Comments   Does not qualify due to age.     Goals Met:  Proper associated with RPD/PD & O2 Sat Independence with exercise equipment Exercise tolerated well No report of concerns or symptoms today Strength training completed today  Goals Unmet:  Not Applicable  Comments: Service time is from 1022 to 1145.    Dr. Mechele Collin is Medical Director for Pulmonary Rehab at [facility].

## 2023-02-03 ENCOUNTER — Encounter (HOSPITAL_COMMUNITY)
Admission: RE | Admit: 2023-02-03 | Discharge: 2023-02-03 | Disposition: A | Discharge status: Home / Self Care | Payer: Medicare Other | Source: Ambulatory Visit | Attending: Pulmonary Disease | Admitting: Pulmonary Disease

## 2023-02-03 DIAGNOSIS — U099 Post covid-19 condition, unspecified: Secondary | ICD-10-CM

## 2023-02-03 DIAGNOSIS — J449 Chronic obstructive pulmonary disease, unspecified: Secondary | ICD-10-CM | POA: Diagnosis not present

## 2023-02-03 DIAGNOSIS — R0602 Shortness of breath: Secondary | ICD-10-CM

## 2023-02-03 NOTE — Progress Notes (Signed)
Daily Session Note  Patient Details  Name: Brendan Hopkins MRN: [id number] Date of Birth: Jan 21, 1936 Referring Provider:   Doristine Devoid Pulmonary Rehab Walk Test from 12/10/2022 in [facility] for Heart, Vascular, & Lung Health  Referring Provider [name]       Encounter Date: 02/03/2023  Check In:  Session Check In - 02/03/23 1140       Check-In   Supervising physician immediately available to respond to emergencies [facility] immediately available    Physician(s) Bernadene Person, NP    [facility]    Staff Present Durel Salts, Zella Richer, MS, ACSM-CEP, Exercise Physiologist;Randi Dionisio Paschal, ACSM-CEP, Exercise Physiologist;Samantha Belarus, RD, Dutch Gray, RN, BSN    Virtual Visit No    Medication changes reported     No    Fall or balance concerns reported    No    Tobacco Cessation No Change    Warm-up and Cool-down Performed as group-led Writer Performed Yes    VAD Patient? No    PAD/SET Patient? No      Pain Assessment   Currently in Pain? No/denies    Pain Score 0-No pain    Multiple Pain Sites No             Capillary Blood Glucose: No results found for this or any previous visit (from the past 24 hour(s)).    Social History   Tobacco Use  Smoking Status Former   Current packs/day: 0.00   Average packs/day: 2.0 packs/day for 57.0 years (114.0 ttl pk-yrs)   Types: Cigarettes   Start date: 05/24/1950   Quit date: 05/25/2007   Years since quitting: 15.7  Smokeless Tobacco Never  Tobacco Comments   Does not qualify due to age.     Goals Met:  Independence with exercise equipment Exercise tolerated well No report of concerns or symptoms today Strength training completed today  Goals Unmet:  Not Applicable  Comments: Service time is from 1022 to 1152    Dr. Mechele Collin is Medical Director for Pulmonary Rehab at [facility].

## 2023-02-08 ENCOUNTER — Encounter (HOSPITAL_COMMUNITY)
Admission: RE | Admit: 2023-02-08 | Discharge: 2023-02-08 | Disposition: A | Discharge status: Home / Self Care | Payer: Medicare Other | Source: Ambulatory Visit | Attending: Pulmonary Disease

## 2023-02-08 VITALS — Wt 246.9 lb

## 2023-02-08 DIAGNOSIS — R0602 Shortness of breath: Secondary | ICD-10-CM | POA: Diagnosis not present

## 2023-02-08 DIAGNOSIS — J449 Chronic obstructive pulmonary disease, unspecified: Secondary | ICD-10-CM | POA: Diagnosis not present

## 2023-02-08 DIAGNOSIS — U099 Post covid-19 condition, unspecified: Secondary | ICD-10-CM

## 2023-02-08 NOTE — Progress Notes (Signed)
Daily Session Note  Patient Details  Name: Brendan Hopkins MRN: [id number] Date of Birth: [birth year] Referring Provider:   Doristine Devoid Pulmonary Rehab Walk Test from 12/10/2022 in [facility] for Heart, Vascular, & Lung Health  Referring Provider [name]       Encounter Date: 02/08/2023  Check In:  Session Check In - 02/08/23 1108       Check-In   Supervising physician immediately available to respond to emergencies [facility] immediately available    Physician(s) Jari Favre, NP    [facility]    Staff Present Durel Salts, Zella Richer, MS, ACSM-CEP, Exercise Physiologist;Randi Dionisio Paschal, ACSM-CEP, Exercise Physiologist;Samantha Belarus, RD, Dutch Gray, RN, Marton Redwood, MS, ACSM-CEP, CCRP, Exercise Physiologist    Virtual Visit No    Medication changes reported     No    Fall or balance concerns reported    No    Tobacco Cessation No Change    Warm-up and Cool-down Performed as group-led instruction    Resistance Training Performed Yes    VAD Patient? No    PAD/SET Patient? No      Pain Assessment   Currently in Pain? No/denies    Multiple Pain Sites No             Capillary Blood Glucose: No results found for this or any previous visit (from the past 24 hour(s)).   Exercise Prescription Changes - 02/08/23 1100       Response to Exercise   Blood Pressure (Admit) 134/76    Blood Pressure (Exercise) 142/58    Blood Pressure (Exit) 120/78    Heart Rate (Admit) 69 bpm    Heart Rate (Exercise) 92 bpm    Heart Rate (Exit) 72 bpm    Oxygen Saturation (Admit) 95 %    Oxygen Saturation (Exercise) 90 %    Oxygen Saturation (Exit) 90 %    Rating of Perceived Exertion (Exercise) 13    Perceived Dyspnea (Exercise) 2    Duration Continue with 30 min of aerobic exercise without signs/symptoms of physical distress.    Intensity THRR unchanged      Progression   Progression Continue to progress workloads to  maintain intensity without signs/symptoms of physical distress.      Resistance Training   Training Prescription Yes    Weight blue bands    Reps 10-15    Time 10 Minutes      Oxygen   Oxygen Continuous    Liters 2      NuStep   Level 5    Minutes 15    METs 2.5      Track   Laps 9    Minutes 15    METs 2.38      Oxygen   Maintain Oxygen Saturation 88% or higher             Social History   Tobacco Use  Smoking Status Former   Current packs/day: 0.00   Average packs/day: 2.0 packs/day for 57.0 years (114.0 ttl pk-yrs)   Types: Cigarettes   Start date: 05/24/1950   Quit date: 05/25/2007   Years since quitting: 15.7  Smokeless Tobacco Never  Tobacco Comments   Does not qualify due to age.     Goals Met:  Proper associated with RPD/PD & O2 Sat Independence with exercise equipment Exercise tolerated well No report of concerns or symptoms today Strength training completed today  Goals Unmet:  Not Applicable  Comments: Service time is from 1011 to 1128.    Dr. Mechele Collin is Medical Director for Pulmonary Rehab at [facility].

## 2023-02-10 ENCOUNTER — Encounter (HOSPITAL_COMMUNITY)
Admission: RE | Admit: 2023-02-10 | Discharge: 2023-02-10 | Disposition: A | Discharge status: Home / Self Care | Payer: Medicare Other | Source: Ambulatory Visit | Attending: Pulmonary Disease | Admitting: Pulmonary Disease

## 2023-02-10 DIAGNOSIS — R0602 Shortness of breath: Secondary | ICD-10-CM

## 2023-02-10 DIAGNOSIS — U099 Post covid-19 condition, unspecified: Secondary | ICD-10-CM

## 2023-02-10 DIAGNOSIS — J449 Chronic obstructive pulmonary disease, unspecified: Secondary | ICD-10-CM | POA: Diagnosis not present

## 2023-02-10 NOTE — Progress Notes (Signed)
Daily Session Note  Patient Details  Name: Brendan Hopkins MRN: [id number] Date of Birth: 12-06-1935 Referring Provider:   Doristine Devoid Pulmonary Rehab Walk Test from 12/10/2022 in [facility] for Heart, Vascular, & Lung Health  Referring Provider [name]       Encounter Date: 02/10/2023  Check In:  Session Check In - 02/10/23 1134       Check-In   Supervising physician immediately available to respond to emergencies [facility] MD immediately available    Physician(s) Carlos Levering    [facility]    Staff Present Durel Salts, Zella Richer, MS, ACSM-CEP, Exercise Physiologist;Randi Dionisio Paschal, ACSM-CEP, Exercise Physiologist;Mary Gerre Scull, RN, Marton Redwood, MS, ACSM-CEP, CCRP, Exercise Physiologist    Virtual Visit No    Medication changes reported     No    Fall or balance concerns reported    No    Tobacco Cessation No Change    Warm-up and Cool-down Performed as group-led instruction    Resistance Training Performed Yes    VAD Patient? No    PAD/SET Patient? No      Pain Assessment   Currently in Pain? No/denies    Pain Score 0-No pain    Multiple Pain Sites No             Capillary Blood Glucose: No results found for this or any previous visit (from the past 24 hour(s)).    Social History   Tobacco Use  Smoking Status Former   Current packs/day: 0.00   Average packs/day: 2.0 packs/day for 57.0 years (114.0 ttl pk-yrs)   Types: Cigarettes   Start date: 05/24/1950   Quit date: 05/25/2007   Years since quitting: 15.7  Smokeless Tobacco Never  Tobacco Comments   Does not qualify due to age.     Goals Met:  Proper associated with RPD/PD & O2 Sat Independence with exercise equipment Exercise tolerated well No report of concerns or symptoms today Strength training completed today  Goals Unmet:  Not Applicable  Comments: Service time is from 1021 to 1158.    Dr. Mechele Collin is Medical Director  for Pulmonary Rehab at [facility].

## 2023-02-14 ENCOUNTER — Telehealth: Payer: Self-pay | Admitting: Family Medicine

## 2023-02-14 NOTE — Telephone Encounter (Signed)
Patient called to request samples of rivaroxaban (XARELTO) 20 MG TABS tablet [[id number]]   As per nurse, three bottles dispensed giving patient a 3 week supply.  Patient will pick up samples from front desk in a few minutes.

## 2023-02-15 ENCOUNTER — Encounter (HOSPITAL_COMMUNITY)
Admission: RE | Admit: 2023-02-15 | Discharge: 2023-02-15 | Disposition: A | Discharge status: Home / Self Care | Payer: Medicare Other | Source: Ambulatory Visit | Attending: Pulmonary Disease

## 2023-02-15 DIAGNOSIS — R0602 Shortness of breath: Secondary | ICD-10-CM

## 2023-02-15 DIAGNOSIS — J449 Chronic obstructive pulmonary disease, unspecified: Secondary | ICD-10-CM | POA: Diagnosis not present

## 2023-02-15 DIAGNOSIS — U099 Post covid-19 condition, unspecified: Secondary | ICD-10-CM

## 2023-02-15 NOTE — Progress Notes (Signed)
Daily Session Note  Patient Details  Name: Brendan Hopkins MRN: [id number] Date of Birth: March 29, 1936 Referring Provider:   Doristine Devoid Pulmonary Rehab Walk Test from 12/10/2022 in [facility] for Heart, Vascular, & Lung Health  Referring Provider [name]       Encounter Date: 02/15/2023  Check In:  Session Check In - 02/15/23 1201       Check-In   Supervising physician immediately available to respond to emergencies [facility] MD immediately available    Physician(s) Carlos Levering, NP    [facility]    Staff Present Durel Salts, Zella Richer, MS, ACSM-CEP, Exercise Physiologist;Randi Dionisio Paschal, ACSM-CEP, Exercise Physiologist;Mary Gerre Scull, RN, BSN    Virtual Visit No    Medication changes reported     No    Fall or balance concerns reported    No    Tobacco Cessation No Change    Warm-up and Cool-down Performed as group-led instruction    Resistance Training Performed Yes    VAD Patient? No    PAD/SET Patient? No      Pain Assessment   Currently in Pain? No/denies    Pain Score 0-No pain    Multiple Pain Sites No             Capillary Blood Glucose: No results found for this or any previous visit (from the past 24 hour(s)).    Social History   Tobacco Use  Smoking Status Former   Current packs/day: 0.00   Average packs/day: 2.0 packs/day for 57.0 years (114.0 ttl pk-yrs)   Types: Cigarettes   Start date: 05/24/1950   Quit date: 05/25/2007   Years since quitting: 15.7  Smokeless Tobacco Never  Tobacco Comments   Does not qualify due to age.     Goals Met:  Proper associated with RPD/PD & O2 Sat Independence with exercise equipment Exercise tolerated well No report of concerns or symptoms today Strength training completed today  Goals Unmet:  Not Applicable  Comments: Service time is from 1013 to 1140.    Dr. Mechele Collin is Medical Director for Pulmonary Rehab at [facility].

## 2023-02-17 ENCOUNTER — Ambulatory Visit (HOSPITAL_BASED_OUTPATIENT_CLINIC_OR_DEPARTMENT_OTHER): Payer: Medicare Other | Admitting: Pulmonary Disease

## 2023-02-17 ENCOUNTER — Encounter (HOSPITAL_COMMUNITY): Payer: Medicare Other

## 2023-02-17 ENCOUNTER — Encounter (HOSPITAL_BASED_OUTPATIENT_CLINIC_OR_DEPARTMENT_OTHER): Payer: Self-pay | Admitting: Pulmonary Disease

## 2023-02-17 ENCOUNTER — Encounter (HOSPITAL_COMMUNITY)
Admission: RE | Admit: 2023-02-17 | Discharge: 2023-02-17 | Disposition: A | Discharge status: Home / Self Care | Payer: Medicare Other | Source: Ambulatory Visit | Attending: Pulmonary Disease | Admitting: Pulmonary Disease

## 2023-02-17 VITALS — BP 138/68 | HR 63 | Ht 69.0 in | Wt 246.0 lb

## 2023-02-17 DIAGNOSIS — J9611 Chronic respiratory failure with hypoxia: Secondary | ICD-10-CM | POA: Diagnosis not present

## 2023-02-17 DIAGNOSIS — U099 Post covid-19 condition, unspecified: Secondary | ICD-10-CM

## 2023-02-17 DIAGNOSIS — R0602 Shortness of breath: Secondary | ICD-10-CM

## 2023-02-17 DIAGNOSIS — J449 Chronic obstructive pulmonary disease, unspecified: Secondary | ICD-10-CM | POA: Insufficient documentation

## 2023-02-17 MED ORDER — PREDNISONE 10 MG PO TABS: ORAL_TABLET | ORAL | 0 refills | Status: AC

## 2023-02-17 MED ORDER — AZITHROMYCIN 250 MG PO TABS: ORAL_TABLET | ORAL | 0 refills | Status: DC
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
## 2023-02-17 NOTE — Patient Instructions (Signed)
COPD with emphysema COPD exacerbation Mild hemoptysis likely secondary to bronchitis --Prednisone taper ordered. Pick up at your pharmacy --CONTINUE Trelegy 200 ONE puff ONCE a day --Reviewed PFTs. Mild obstructive defect --Continue pulmonary rehab  Chronic hypoxemic respiratory failure --Wear 2L-3L O2 with activity and sleep

## 2023-02-17 NOTE — Progress Notes (Signed)
Daily Session Note  Patient Details  Name: Brendan Hopkins MRN: [id number] Date of Birth: 06/13/1935 Referring Provider:   Doristine Devoid Pulmonary Rehab Walk Test from 12/10/2022 in [facility]  Referring Provider [name]       Encounter Date: 02/17/2023  Check In:  Session Check In - 02/17/23 1101       Check-In   Supervising physician immediately available to respond to emergencies [facility] MD immediately available    Physician(s) Edd Fabian, NP    Location [facility]    Staff Present Durel Salts, Zella Richer, MS, ACSM-CEP, Exercise Physiologist;Kairee Isa Dionisio Paschal, ACSM-CEP, Exercise Physiologist;Mary Gerre Scull, RN, BSN;Samantha Belarus, RD, LDN    Virtual Visit No    Medication changes reported     Yes    Comments started on prednisone    Fall or balance concerns reported    No    Tobacco Cessation No Change    Warm-up and Cool-down Performed as group-led instruction    Resistance Training Performed Yes    VAD Patient? No    PAD/SET Patient? No      Pain Assessment   Currently in Pain? No/denies    Multiple Pain Sites No             Capillary Blood Glucose: No results found for this or any previous visit (from the past 24 hour(s)).    Social History   Tobacco Use  Smoking Status Former   Current packs/day: 0.00   Average packs/day: 2.0 packs/day for 57.0 years (114.0 ttl pk-yrs)   Types: Cigarettes   Start date: 05/24/1950   Quit date: 05/25/2007   Years since quitting: 15.7  Smokeless Tobacco Never  Tobacco Comments   Does not qualify due to age.     Goals Met:  Exercise tolerated well Queuing for purse lip breathing No report of concerns or symptoms today Strength training completed today  Goals Unmet:  Not Applicable  Comments: Service time is from 1030 to 1157.    Dr. Mechele Collin is Medical Director for Pulmonary Rehab at [facility].

## 2023-02-22 ENCOUNTER — Encounter (HOSPITAL_COMMUNITY)
Admission: RE | Admit: 2023-02-22 | Discharge: 2023-02-22 | Disposition: A | Discharge status: Home / Self Care | Payer: Medicare Other | Source: Ambulatory Visit | Attending: Pulmonary Disease | Admitting: Pulmonary Disease

## 2023-02-22 VITALS — Wt 249.8 lb

## 2023-02-22 DIAGNOSIS — U099 Post covid-19 condition, unspecified: Secondary | ICD-10-CM | POA: Diagnosis not present

## 2023-02-22 DIAGNOSIS — R0602 Shortness of breath: Secondary | ICD-10-CM | POA: Diagnosis not present

## 2023-02-22 DIAGNOSIS — I4891 Unspecified atrial fibrillation: Secondary | ICD-10-CM | POA: Diagnosis not present

## 2023-02-22 DIAGNOSIS — I4892 Unspecified atrial flutter: Secondary | ICD-10-CM | POA: Insufficient documentation

## 2023-02-22 DIAGNOSIS — J449 Chronic obstructive pulmonary disease, unspecified: Secondary | ICD-10-CM | POA: Diagnosis not present

## 2023-02-22 DIAGNOSIS — R9431 Abnormal electrocardiogram [ECG] [EKG]: Secondary | ICD-10-CM | POA: Diagnosis not present

## 2023-02-22 DIAGNOSIS — Z87891 Personal history of nicotine dependence: Secondary | ICD-10-CM | POA: Diagnosis not present

## 2023-02-22 NOTE — Progress Notes (Signed)
Daily Session Note  Patient Details  Name: Brendan Hopkins MRN: [id number] Date of Birth: 1936/04/14 Referring Provider:   Doristine Devoid Pulmonary Rehab Walk Test from 12/10/2022 in [facility]  Referring Provider [name]       Encounter Date: 02/22/2023  Check In:  Session Check In - 02/22/23 1127       Check-In   Supervising physician immediately available to respond to emergencies [facility] MD immediately available    Physician(s) Robin Searing, NP    Location [facility]    Staff Present Durel Salts, Zella Richer, MS, ACSM-CEP, Exercise Physiologist;Randi Dionisio Paschal, ACSM-CEP, Exercise Physiologist;Mary Gerre Scull, RN, BSN;Samantha Belarus, RD, LDN    Virtual Visit No    Medication changes reported     No    Fall or balance concerns reported    No    Tobacco Cessation No Change    Warm-up and Cool-down Performed as group-led instruction    Resistance Training Performed Yes    VAD Patient? No    PAD/SET Patient? No      Pain Assessment   Currently in Pain? No/denies    Multiple Pain Sites No             Capillary Blood Glucose: No results found for this or any previous visit (from the past 24 hour(s)).   Exercise Prescription Changes - 02/22/23 1100       Response to Exercise   Blood Pressure (Admit) 126/70    Blood Pressure (Exercise) 160/80    Blood Pressure (Exit) 126/70    Heart Rate (Admit) 73 bpm    Heart Rate (Exercise) 101 bpm    Heart Rate (Exit) 77 bpm    Oxygen Saturation (Admit) 95 %    Oxygen Saturation (Exercise) 89 %    Oxygen Saturation (Exit) 95 %    Rating of Perceived Exertion (Exercise) 15    Perceived Dyspnea (Exercise) 3    Duration Continue with 30 min of aerobic exercise without signs/symptoms of physical distress.    Intensity THRR unchanged      Progression   Progression Continue to progress workloads to maintain intensity without signs/symptoms of physical  distress.      Resistance Training   Training Prescription Yes    Weight blue bands    Reps 10-15    Time 10 Minutes      Oxygen   Oxygen Continuous    Liters 2      NuStep   Level 5    Minutes 15    METs 2.8      Track   Laps 12    Minutes 15    METs 2.85      Oxygen   Maintain Oxygen Saturation 88% or higher             Social History   Tobacco Use  Smoking Status Former   Current packs/day: 0.00   Average packs/day: 2.0 packs/day for 57.0 years (114.0 ttl pk-yrs)   Types: Cigarettes   Start date: 05/24/1950   Quit date: 05/25/2007   Years since quitting: 15.7  Smokeless Tobacco Never  Tobacco Comments   Does not qualify due to age.     Goals Met:  Proper associated with RPD/PD & O2 Sat Independence with exercise equipment Exercise tolerated well No report of concerns or symptoms today Strength training completed today  Goals Unmet:  Not Applicable  Comments: Service time is from 1014  to 1150.    Dr. Mechele Collin is Medical Director for Pulmonary Rehab at [facility].

## 2023-02-23 NOTE — Progress Notes (Signed)
Pulmonary Individual Treatment Plan  Patient Details  Name: RAYNALDO WO MRN: [id number] Date of Birth: Sep 05, 1935 Referring Provider:   Doristine Devoid Pulmonary Rehab Walk Test from 12/10/2022 in [facility]  Referring Provider [name]       Initial Encounter Date:  Flowsheet Row Pulmonary Rehab Walk Test from 12/10/2022 in [facility]  Date 12/10/22       Visit Diagnosis: Shortness of breath  Post covid-19 condition, unspecified  Patient's Home Medications on Admission:   Current Outpatient Medications:    acetaminophen (TYLENOL) 325 MG tablet, Take 325 mg by mouth every evening., Disp: , Rfl:    albuterol (VENTOLIN HFA) 108 (90 Base) MCG/ACT inhaler, Inhale 1 to 2  puffs by mouth every 6 hours as needed for shortness of breath (Patient taking differently: Inhale 1 puff into the lungs every 6 (six) hours as needed for shortness of breath.), Disp: 6.7 each, Rfl: 11   amLODipine (NORVASC) 5 MG tablet, Take 1 tablet by mouth at bedtime, Disp: 90 tablet, Rfl: 0   aspirin 81 MG chewable tablet, Chew 81 mg by mouth at bedtime., Disp: , Rfl:    azithromycin (ZITHROMAX) 250 MG tablet, Take two tablets on day 1, then one tablet daily on day 2-5., Disp: 6 tablet, Rfl: 0   Fluticasone-Umeclidin-Vilant (TRELEGY ELLIPTA) 200-62.5-25 MCG/ACT AEPB, Inhale 1 puff into the lungs daily., Disp: 60 each, Rfl: 5   furosemide (LASIX) 40 MG tablet, Take 1 tablet by mouth once daily, Disp: 30 tablet, Rfl: 11   gabapentin (NEURONTIN) 300 MG capsule, TAKE 1 CAPSULE BY MOUTH THREE TIMES DAILY AS NEEDED FOR  SCIATICA (Patient taking differently: Take 300 mg by mouth 2 (two) times daily.), Disp: 270 capsule, Rfl: 2   Iron, Ferrous Sulfate, 325 (65 Fe) MG TABS, Take 325 mg by mouth daily., Disp: 30 tablet, Rfl: 1   metoprolol succinate (TOPROL-XL) 25 MG 24 hr tablet, Take 1 tablet by mouth  once daily, Disp: 90 tablet, Rfl: 0   Multiple Vitamin (MULTIVITAMIN WITH MINERALS) TABS, Take 1 tablet by mouth daily., Disp: , Rfl:    Omega-3 Fatty Acids (FISH OIL) 1200 MG CAPS, Take 1,200 mg by mouth every evening., Disp: , Rfl:    pantoprazole (PROTONIX) 40 MG tablet, Take 1 tablet by mouth every day, Disp: 30 tablet, Rfl: 11   predniSONE (DELTASONE) 10 MG tablet, Take 4 tablets (40 mg total) by mouth daily with breakfast for 2 days, THEN 3 tablets (30 mg total) daily with breakfast for 2 days, THEN 2 tablets (20 mg total) daily with breakfast for 2 days, THEN 1 tablet (10 mg total) daily with breakfast for 2 days., Disp: 20 tablet, Rfl: 0   rivaroxaban (XARELTO) 20 MG TABS tablet, Take 1 tablet (20 mg total) by mouth daily., Disp: 90 tablet, Rfl: 1   zolpidem (AMBIEN) 10 MG tablet, Take 1 tablet by mouth at bedtime as needed for sleep, Disp: 30 tablet, Rfl: 5  Past Medical History: Past Medical History:  Diagnosis Date   A-fib (HCC)    Acute deep vein thrombosis (DVT) of tibial vein of right lower extremity (HCC)    after ankle fracture/surgery (2/21)   Arthritis    RIGHT HIP, HANDS, BACK   Cancer (HCC)    Skin cancer    Cataract    Cervical radiculopathy    severe C3-4 neuroforaminal stenosis, C5-6 left synovial cyst compressing  left nerve root.   Colon polyps    2005   COPD (chronic obstructive pulmonary disease) (HCC)    SMOKED FOR 57 YRS   Diverticulosis    2005   Fatty liver    GERD (gastroesophageal reflux disease)    Hard of hearing    History of kidney stones    Hypertension    Leg fracture, right    Pain    RIGHT HIP AND BACK   Personal history of colonic polyps-adenoma and sessile serrated polyp 07/15/2010   Pneumonia 03/2019   RBBB    POSS HX AF IN PAST- PAC'S   Shortness of breath    WITH EXERTION ONLY   Stroke (HCC)    TIA   TIA (transient ischemic attack)    Wears glasses     Tobacco Use: Social History   Tobacco Use  Smoking Status Former    Current packs/day: 0.00   Average packs/day: 2.0 packs/day for 57.0 years (114.0 ttl pk-yrs)   Types: Cigarettes   Start date: 05/24/1950   Quit date: 05/25/2007   Years since quitting: 15.7  Smokeless Tobacco Never  Tobacco Comments   Does not qualify due to age.     Labs: Review Flowsheet  More data exists      Latest Ref Rng & Units 12/19/2014 12/14/2016 12/20/2017 01/26/2019 07/03/2022  Labs for ITP Cardiac and Pulmonary Rehab  Cholestrol <200 mg/dL 161  096  045  409  -  LDL (calc) mg/dL (calc) 811  914  782  956  -  HDL-C > OR = 40 mg/dL 52  56  47  46  -  Trlycerides <150 mg/dL 78  70  213  086  -  Hemoglobin A1c <5.7 % of total Hgb - - 6.1  6.0  -  Bicarbonate 20.0 - 28.0 mmol/L - - - - 26.0   TCO2 22 - 32 mmol/L - - - - 28   Acid-base deficit 0.0 - 2.0 mmol/L - - - - 2.0   O2 Saturation % - - - - 87  27.5     Details       Multiple values from one day are sorted in reverse-chronological order         Capillary Blood Glucose: Lab Results  Component Value Date   GLUCAP 101 (H) 12/03/2011   GLUCAP 122 (H) 12/02/2011   GLUCAP 106 (H) 12/02/2011   GLUCAP 105 (H) 12/02/2011   GLUCAP 107 (H) 12/02/2011     Pulmonary Assessment Scores:  Pulmonary Assessment Scores     Row Name 12/10/22 1001         ADL UCSD   ADL Phase Entry     SOB Score total 51       CAT Score   CAT Score 25       mMRC Score   mMRC Score 3             UCSD: Self-administered rating of dyspnea associated with activities of daily living (ADLs) 6-point scale (0 = "not at all" to 5 = "maximal or unable to do because of breathlessness")  Scoring Scores range from 0 to 120.  Minimally important difference is 5 units  CAT: CAT can identify the health impairment of COPD patients and is better correlated with disease progression.  CAT has a scoring range of zero to 40. The CAT score is classified into four groups of low (less than 10), medium (10 - 20), high (21-30) and very  high (31-40)  based on the impact level of disease on health status. A CAT score over 10 suggests significant symptoms.  A worsening CAT score could be explained by an exacerbation, poor medication adherence, poor inhaler technique, or progression of COPD or comorbid conditions.  CAT MCID is 2 points  mMRC: mMRC (Modified Medical Research Council) Dyspnea Scale is used to assess the degree of baseline functional disability in patients of respiratory disease due to dyspnea. No minimal important difference is established. A decrease in score of 1 point or greater is considered a positive change.   Pulmonary Function Assessment:  Pulmonary Function Assessment - 12/10/22 0949       Breath   Bilateral Breath Sounds Rales;Basilar    Shortness of Breath Fear of Shortness of Breath;Limiting activity;Panic with Shortness of Breath;Yes             Exercise Target Goals: Exercise Program Goal: Individual exercise prescription set using results from initial 6 min walk test and THRR while considering  patient's activity barriers and safety.   Exercise Prescription Goal: Initial exercise prescription builds to 30-45 minutes a day of aerobic activity, 2-3 days per week.  Home exercise guidelines will be given to patient during program as part of exercise prescription that the participant will acknowledge.  Activity Barriers & Risk Stratification:  Activity Barriers & Cardiac Risk Stratification - 12/10/22 0939       Activity Barriers & Cardiac Risk Stratification   Activity Barriers Arthritis;Balance Concerns;Deconditioning;Muscular Weakness;History of Falls;Left Hip Replacement;Right Hip Replacement;Shortness of Breath;Back Problems             6 Minute Walk:  6 Minute Walk     Row Name 12/10/22 1101         6 Minute Walk   Phase Initial     Distance 920 feet     Walk Time 6 minutes     # of Rest Breaks 1     MPH 1.74     METS 1.19     RPE 11     Perceived Dyspnea  1     VO2 Peak 4.16      Resting HR 89 bpm     Resting BP 132/62     Resting Oxygen Saturation  89 %     Exercise Oxygen Saturation  during 6 min walk 86 %     Max Ex. HR 100 bpm     Max Ex. BP 160/80     2 Minute Post BP 140/80       Interval HR   1 Minute HR 76     2 Minute HR 85     3 Minute HR 93     4 Minute HR 95     5 Minute HR 100     6 Minute HR 97     2 Minute Post HR 77     Interval Heart Rate? Yes       Interval Oxygen   Interval Oxygen? Yes     Baseline Oxygen Saturation % 93 %     1 Minute Oxygen Saturation % 88 %     1 Minute Liters of Oxygen 0 L     2 Minute Oxygen Saturation % 86 %  applied 2L     2 Minute Liters of Oxygen 2 L     3 Minute Oxygen Saturation % 88 %     3 Minute Liters of Oxygen 2 L     4 Minute Oxygen  Saturation % 87 %     4 Minute Liters of Oxygen 2 L     5 Minute Oxygen Saturation % 90 %     5 Minute Liters of Oxygen 2 L     6 Minute Oxygen Saturation % 91 %     6 Minute Liters of Oxygen 2 L     2 Minute Post Oxygen Saturation % 96 %     2 Minute Post Liters of Oxygen 2 L              Oxygen Initial Assessment:  Oxygen Initial Assessment - 12/10/22 0942       Home Oxygen   Home Oxygen Device Home Concentrator;E-Tanks    Sleep Oxygen Prescription None    Home Exercise Oxygen Prescription Continuous   pt wears as needed   Liters per minute 2    Home Resting Oxygen Prescription None    Compliance with Home Oxygen Use Yes      Initial 6 min Walk   Oxygen Used Continuous    Liters per minute 2      Program Oxygen Prescription   Program Oxygen Prescription Continuous    Liters per minute 2    Comments Pt needs 2L for exercise      Intervention   Short Term Goals To learn and exhibit compliance with exercise, home and travel O2 prescription;To learn and understand importance of monitoring SPO2 with pulse oximeter and demonstrate accurate use of the pulse oximeter.;To learn and understand importance of maintaining oxygen saturations>88%;To learn  and demonstrate proper pursed lip breathing techniques or other breathing techniques. ;To learn and demonstrate proper use of respiratory medications    Long  Term Goals Exhibits compliance with exercise, home  and travel O2 prescription;Maintenance of O2 saturations>88%;Compliance with respiratory medication;Exhibits proper breathing techniques, such as pursed lip breathing or other method taught during program session;Verbalizes importance of monitoring SPO2 with pulse oximeter and return demonstration;Demonstrates proper use of MDI's             Oxygen Re-Evaluation:  Oxygen Re-Evaluation     Row Name 12/17/22 1133 01/19/23 1128 02/17/23 0846         Program Oxygen Prescription   Program Oxygen Prescription Continuous Continuous Continuous     Liters per minute 2 2 2        Home Oxygen   Home Oxygen Device Home Concentrator;E-Tanks Home Concentrator;E-Tanks Home Concentrator;E-Tanks     Sleep Oxygen Prescription None None None     Home Exercise Oxygen Prescription Continuous  pt wears as needed Continuous  pt wears as needed Continuous  pt wears as needed     Liters per minute 2 2 2      Home Resting Oxygen Prescription None None None     Compliance with Home Oxygen Use Yes Yes Yes       Goals/Expected Outcomes   Short Term Goals To learn and exhibit compliance with exercise, home and travel O2 prescription;To learn and understand importance of monitoring SPO2 with pulse oximeter and demonstrate accurate use of the pulse oximeter.;To learn and understand importance of maintaining oxygen saturations>88%;To learn and demonstrate proper pursed lip breathing techniques or other breathing techniques. ;To learn and demonstrate proper use of respiratory medications To learn and exhibit compliance with exercise, home and travel O2 prescription;To learn and understand importance of monitoring SPO2 with pulse oximeter and demonstrate accurate use of the pulse oximeter.;To learn and understand  importance of maintaining oxygen saturations>88%;To learn and demonstrate proper  pursed lip breathing techniques or other breathing techniques. ;To learn and demonstrate proper use of respiratory medications To learn and exhibit compliance with exercise, home and travel O2 prescription;To learn and understand importance of monitoring SPO2 with pulse oximeter and demonstrate accurate use of the pulse oximeter.;To learn and understand importance of maintaining oxygen saturations>88%;To learn and demonstrate proper pursed lip breathing techniques or other breathing techniques. ;To learn and demonstrate proper use of respiratory medications     Long  Term Goals Exhibits compliance with exercise, home  and travel O2 prescription;Maintenance of O2 saturations>88%;Compliance with respiratory medication;Exhibits proper breathing techniques, such as pursed lip breathing or other method taught during program session;Verbalizes importance of monitoring SPO2 with pulse oximeter and return demonstration;Demonstrates proper use of MDI's Exhibits compliance with exercise, home  and travel O2 prescription;Maintenance of O2 saturations>88%;Compliance with respiratory medication;Exhibits proper breathing techniques, such as pursed lip breathing or other method taught during program session;Verbalizes importance of monitoring SPO2 with pulse oximeter and return demonstration;Demonstrates proper use of MDI's Exhibits compliance with exercise, home  and travel O2 prescription;Maintenance of O2 saturations>88%;Compliance with respiratory medication;Exhibits proper breathing techniques, such as pursed lip breathing or other method taught during program session;Verbalizes importance of monitoring SPO2 with pulse oximeter and return demonstration;Demonstrates proper use of MDI's     Goals/Expected Outcomes Compliance and understanding of oxygen saturation monitoring and breathing techniques to decrease shortness of breath. Compliance and  understanding of oxygen saturation monitoring and breathing techniques to decrease shortness of breath. Compliance and understanding of oxygen saturation monitoring and breathing techniques to decrease shortness of breath.              Oxygen Discharge (Final Oxygen Re-Evaluation):  Oxygen Re-Evaluation - 02/17/23 0846       Program Oxygen Prescription   Program Oxygen Prescription Continuous    Liters per minute 2      Home Oxygen   Home Oxygen Device Home Concentrator;E-Tanks    Sleep Oxygen Prescription None    Home Exercise Oxygen Prescription Continuous   pt wears as needed   Liters per minute 2    Home Resting Oxygen Prescription None    Compliance with Home Oxygen Use Yes      Goals/Expected Outcomes   Short Term Goals To learn and exhibit compliance with exercise, home and travel O2 prescription;To learn and understand importance of monitoring SPO2 with pulse oximeter and demonstrate accurate use of the pulse oximeter.;To learn and understand importance of maintaining oxygen saturations>88%;To learn and demonstrate proper pursed lip breathing techniques or other breathing techniques. ;To learn and demonstrate proper use of respiratory medications    Long  Term Goals Exhibits compliance with exercise, home  and travel O2 prescription;Maintenance of O2 saturations>88%;Compliance with respiratory medication;Exhibits proper breathing techniques, such as pursed lip breathing or other method taught during program session;Verbalizes importance of monitoring SPO2 with pulse oximeter and return demonstration;Demonstrates proper use of MDI's    Goals/Expected Outcomes Compliance and understanding of oxygen saturation monitoring and breathing techniques to decrease shortness of breath.             Initial Exercise Prescription:  Initial Exercise Prescription - 12/10/22 1200       Date of Initial Exercise RX and Referring Provider   Date 12/10/22    Referring Provider [name]     Expected Discharge Date 03/10/23      Oxygen   Oxygen Continuous    Liters 2    Maintain Oxygen Saturation 88% or higher  NuStep   Level 2    SPM 60    Minutes 15    METs 2      Track   Laps 12    Minutes 15    METs 1.5      Prescription Details   Frequency (times per week) 2    Duration Progress to 30 minutes of continuous aerobic without signs/symptoms of physical distress      Intensity   THRR 40-80% of Max Heartrate 54-109    Ratings of Perceived Exertion 11-13    Perceived Dyspnea 0-4      Progression   Progression Continue to progress workloads to maintain intensity without signs/symptoms of physical distress.      Resistance Training   Training Prescription Yes    Weight blue bands    Reps 10-15             Perform Capillary Blood Glucose checks as needed.  Exercise Prescription Changes:   Exercise Prescription Changes     Row Name 12/28/22 1200 01/11/23 1100 01/25/23 1100 02/08/23 1100 02/22/23 1100     Response to Exercise   Blood Pressure (Admit) 118/64 120/64 118/70 134/76 126/70   Blood Pressure (Exercise) 150/70 148/70 142/68 142/58 160/80   Blood Pressure (Exit) 110/62 128/68 124/60 120/78 126/70   Heart Rate (Admit) 76 bpm 70 bpm 68 bpm 69 bpm 73 bpm   Heart Rate (Exercise) 96 bpm 92 bpm 89 bpm 92 bpm 101 bpm   Heart Rate (Exit) 79 bpm 71 bpm 81 bpm 72 bpm 77 bpm   Oxygen Saturation (Admit) 93 % 96 % 95 % 95 % 95 %   Oxygen Saturation (Exercise) 92 % 92 % 94 % 90 % 89 %   Oxygen Saturation (Exit) 94 % 95 % 95 % 90 % 95 %   Rating of Perceived Exertion (Exercise) 15 13 11 13 15    Perceived Dyspnea (Exercise) 2 2 2 2 3    Duration Continue with 30 min of aerobic exercise without signs/symptoms of physical distress. Continue with 30 min of aerobic exercise without signs/symptoms of physical distress. Continue with 30 min of aerobic exercise without signs/symptoms of physical distress. Continue with 30 min of aerobic exercise without  signs/symptoms of physical distress. Continue with 30 min of aerobic exercise without signs/symptoms of physical distress.   Intensity THRR unchanged THRR unchanged THRR unchanged THRR unchanged THRR unchanged     Progression   Progression Continue to progress workloads to maintain intensity without signs/symptoms of physical distress. Continue to progress workloads to maintain intensity without signs/symptoms of physical distress. Continue to progress workloads to maintain intensity without signs/symptoms of physical distress. Continue to progress workloads to maintain intensity without signs/symptoms of physical distress. Continue to progress workloads to maintain intensity without signs/symptoms of physical distress.     Resistance Training   Training Prescription Yes Yes Yes Yes Yes   Weight blue bands blue bands blue bands blue bands blue bands   Reps 10-15 10-15 10-15 10-15 10-15   Time 10 Minutes 10 Minutes 10 Minutes 10 Minutes 10 Minutes     Oxygen   Oxygen Continuous Continuous Continuous Continuous Continuous   Liters 2 2 2 2 2      NuStep   Level 1 2 2 5 5    SPM -- 93 -- -- --   Minutes 15 15 15 15 15    METs 2.1 2.8 2.6 2.5 2.8     Track   Laps 10 11 10 9  12  Minutes 15 15 15 15 15    METs 2.54 2.69 2.54 2.38 2.85     Oxygen   Maintain Oxygen Saturation 88% or higher 88% or higher 88% or higher 88% or higher 88% or higher            Exercise Comments:   Exercise Comments     Row Name 12/16/22 1617           Exercise Comments Pt completed his first day of exercise. Woody exercised for 15 min on the [employer] and track. He averaged 1.9 METs at level 1 on the [employer] and 2.08 METs on the track. Quinlin performed the warmup and cooldown standing. He does have orthopedic limitations so he skips certain exercises or does alternatives. Will go over more alternative exercises. Discussed METs and how to increase METs. Unsure if Hilda was retaining what I was saying.                 Exercise Goals and Review:   Exercise Goals     Row Name 12/10/22 0940 12/17/22 1104 01/19/23 1122         Exercise Goals   Increase Physical Activity Yes Yes Yes     Intervention Provide advice, education, support and counseling about physical activity/exercise needs.;Develop an individualized exercise prescription for aerobic and resistive training based on initial evaluation findings, risk stratification, comorbidities and participant's personal goals. Provide advice, education, support and counseling about physical activity/exercise needs.;Develop an individualized exercise prescription for aerobic and resistive training based on initial evaluation findings, risk stratification, comorbidities and participant's personal goals. Provide advice, education, support and counseling about physical activity/exercise needs.;Develop an individualized exercise prescription for aerobic and resistive training based on initial evaluation findings, risk stratification, comorbidities and participant's personal goals.     Expected Outcomes Short Term: Attend rehab on a regular basis to increase amount of physical activity.;Long Term: Add in home exercise to make exercise part of routine and to increase amount of physical activity. Short Term: Attend rehab on a regular basis to increase amount of physical activity.;Long Term: Add in home exercise to make exercise part of routine and to increase amount of physical activity. Short Term: Attend rehab on a regular basis to increase amount of physical activity.;Long Term: Add in home exercise to make exercise part of routine and to increase amount of physical activity.;Long Term: Exercising regularly at least 3-5 days a week.     Increase Strength and Stamina Yes Yes Yes     Intervention Provide advice, education, support and counseling about physical activity/exercise needs.;Develop an individualized exercise prescription for aerobic and resistive  training based on initial evaluation findings, risk stratification, comorbidities and participant's personal goals. Provide advice, education, support and counseling about physical activity/exercise needs.;Develop an individualized exercise prescription for aerobic and resistive training based on initial evaluation findings, risk stratification, comorbidities and participant's personal goals. Provide advice, education, support and counseling about physical activity/exercise needs.;Develop an individualized exercise prescription for aerobic and resistive training based on initial evaluation findings, risk stratification, comorbidities and participant's personal goals.     Expected Outcomes Short Term: Perform resistance training exercises routinely during rehab and add in resistance training at home;Short Term: Increase workloads from initial exercise prescription for resistance, speed, and METs.;Long Term: Improve cardiorespiratory fitness, muscular endurance and strength as measured by increased METs and functional capacity ( ) Short Term: Perform resistance training exercises routinely during rehab and add in resistance training at home;Short Term: Increase workloads from initial exercise prescription for resistance,  speed, and METs.;Long Term: Improve cardiorespiratory fitness, muscular endurance and strength as measured by increased METs and functional capacity ( ) Short Term: Perform resistance training exercises routinely during rehab and add in resistance training at home;Short Term: Increase workloads from initial exercise prescription for resistance, speed, and METs.;Long Term: Improve cardiorespiratory fitness, muscular endurance and strength as measured by increased METs and functional capacity ( )     Able to understand and use rate of perceived exertion (RPE) scale Yes Yes Yes     Intervention Provide education and explanation on how to use RPE scale Provide education and explanation on how  to use RPE scale Provide education and explanation on how to use RPE scale     Expected Outcomes Short Term: Able to use RPE daily in rehab to express subjective intensity level;Long Term:  Able to use RPE to guide intensity level when exercising independently Short Term: Able to use RPE daily in rehab to express subjective intensity level;Long Term:  Able to use RPE to guide intensity level when exercising independently Short Term: Able to use RPE daily in rehab to express subjective intensity level;Long Term:  Able to use RPE to guide intensity level when exercising independently     Able to understand and use Dyspnea scale Yes Yes Yes     Intervention Provide education and explanation on how to use Dyspnea scale Provide education and explanation on how to use Dyspnea scale Provide education and explanation on how to use Dyspnea scale     Expected Outcomes Short Term: Able to use Dyspnea scale daily in rehab to express subjective sense of shortness of breath during exertion;Long Term: Able to use Dyspnea scale to guide intensity level when exercising independently Short Term: Able to use Dyspnea scale daily in rehab to express subjective sense of shortness of breath during exertion;Long Term: Able to use Dyspnea scale to guide intensity level when exercising independently Short Term: Able to use Dyspnea scale daily in rehab to express subjective sense of shortness of breath during exertion;Long Term: Able to use Dyspnea scale to guide intensity level when exercising independently     Knowledge and understanding of Target Heart Rate Range (THRR) Yes Yes Yes     Intervention Provide education and explanation of THRR including how the numbers were predicted and where they are located for reference Provide education and explanation of THRR including how the numbers were predicted and where they are located for reference Provide education and explanation of THRR including how the numbers were predicted and where  they are located for reference     Expected Outcomes Short Term: Able to state/look up THRR;Long Term: Able to use THRR to govern intensity when exercising independently;Short Term: Able to use daily as guideline for intensity in rehab Short Term: Able to state/look up THRR;Long Term: Able to use THRR to govern intensity when exercising independently;Short Term: Able to use daily as guideline for intensity in rehab Short Term: Able to state/look up THRR;Long Term: Able to use THRR to govern intensity when exercising independently;Short Term: Able to use daily as guideline for intensity in rehab     Understanding of Exercise Prescription Yes Yes Yes     Intervention Provide education, explanation, and written materials on patient's individual exercise prescription Provide education, explanation, and written materials on patient's individual exercise prescription Provide education, explanation, and written materials on patient's individual exercise prescription     Expected Outcomes Short Term: Able to explain program exercise prescription;Long Term: Able to explain home  exercise prescription to exercise independently Short Term: Able to explain program exercise prescription;Long Term: Able to explain home exercise prescription to exercise independently Short Term: Able to explain program exercise prescription;Long Term: Able to explain home exercise prescription to exercise independently              Exercise Goals Re-Evaluation :  Exercise Goals Re-Evaluation     Row Name 12/17/22 1104 01/19/23 1122 02/17/23 0844         Exercise Goal Re-Evaluation   Exercise Goals Review Increase Physical Activity;Able to understand and use Dyspnea scale;Understanding of Exercise Prescription;Increase Strength and Stamina;Knowledge and understanding of Target Heart Rate Range (THRR);Able to understand and use rate of perceived exertion (RPE) scale Increase Physical Activity;Able to understand and use Dyspnea  scale;Understanding of Exercise Prescription;Increase Strength and Stamina;Knowledge and understanding of Target Heart Rate Range (THRR);Able to understand and use rate of perceived exertion (RPE) scale Increase Physical Activity;Able to understand and use Dyspnea scale;Understanding of Exercise Prescription;Increase Strength and Stamina;Knowledge and understanding of Target Heart Rate Range (THRR);Able to understand and use rate of perceived exertion (RPE) scale     Comments Valerian has completed 1 exercise session. He exercises for 15 min on the Nustep and track. He averages 1.9 METs at level 1 on the Nustep and 2.08 METs on the track. He performs the warmup and cooldown standing/seated dependent on his orthopedic limitations. Encarnacion need verbal cues and alternative exercises when doing the cooldown. It is too soon to note any discernable progressions. Will continue to monitor and progress as able. Jaqwan has completed 10 exercise sessions. He exercises for 15 min on the Nustep and track. He averages 2.6 METs at level 3 on the Nustep and 2.54 METs on the track. He performs the warmup and cooldown standing/seated dependent on his orthopedic limitations. Terrance understands the warmup and cooldown without verbal cues now. He has increased his workload for both exercise modes. His level on the Nustep has increased from 1-3 and laps on the track have increased from 8 to 11. Amilcar tolerates progressions well. Will continue to monitor and progress as able. Joie has completed 18 exercise sessions. He exercises for 15 min on the Nustep and track. He averages 2.5 METs at level 5 on the Nustep and 3.0 METs on the track. He performs the warmup and cooldown standing holding onto a chair for balance. He has also increased his band resistance for the cooldown. Baran has progressed to performing most, if not all of the warmup and cooldown standing. He has increased his workload on the Nustep as METs have remained  relatively the same. His track laps have also increased. Will discuss home exercise. Will continue to monitor and progress as able.     Expected Outcomes Through exercise at rehab and home, the patient will decrease shortness of breath with daily activities and feel confident in carrying out an exercise regimen at home. Through exercise at rehab and home, the patient will decrease shortness of breath with daily activities and feel confident in carrying out an exercise regimen at home. Through exercise at rehab and home, the patient will decrease shortness of breath with daily activities and feel confident in carrying out an exercise regimen at home.              Discharge Exercise Prescription (Final Exercise Prescription Changes):  Exercise Prescription Changes - 02/22/23 1100       Response to Exercise   Blood Pressure (Admit) 126/70    Blood Pressure (  Exercise) 160/80    Blood Pressure (Exit) 126/70    Heart Rate (Admit) 73 bpm    Heart Rate (Exercise) 101 bpm    Heart Rate (Exit) 77 bpm    Oxygen Saturation (Admit) 95 %    Oxygen Saturation (Exercise) 89 %    Oxygen Saturation (Exit) 95 %    Rating of Perceived Exertion (Exercise) 15    Perceived Dyspnea (Exercise) 3    Duration Continue with 30 min of aerobic exercise without signs/symptoms of physical distress.    Intensity THRR unchanged      Progression   Progression Continue to progress workloads to maintain intensity without signs/symptoms of physical distress.      Resistance Training   Training Prescription Yes    Weight blue bands    Reps 10-15    Time 10 Minutes      Oxygen   Oxygen Continuous    Liters 2      NuStep   Level 5    Minutes 15    METs 2.8      Track   Laps 12    Minutes 15    METs 2.85      Oxygen   Maintain Oxygen Saturation 88% or higher             Nutrition:  Target Goals: Understanding of nutrition guidelines, daily intake of sodium 1500mg , cholesterol 200mg , calories 30%  from fat and 7% or less from saturated fats, daily to have 5 or more servings of fruits and vegetables.  Biometrics:  Pre Biometrics - 12/10/22 1057       Pre Biometrics   Grip Strength 46 kg              Nutrition Therapy Plan and Nutrition Goals:  Nutrition Therapy & Goals - 02/15/23 1127       Nutrition Therapy   Diet Heart Healthy Diet      Personal Nutrition Goals   Nutrition Goal Patient to improve diet quality by using the plate method as a guide for meal planning to include lean protein/plant protein, fruits, vegetables, whole grains, nonfat dairy as part of a well-balanced diet.   in progress.   Personal Goal #2 Patient to identify strategies for weight loss of 0.5-2.0# per week.   goal not met.   Comments Goals in progress. Yeray enjoys a wide variety of foods including fruits and vegetables. While he does cook often, he does buy many convenience foods such as frozen chicken pot pie, sweets, etc. He is motivated to lose weight. We discussed and reviewed multiple strategies for weight losss including decreasing calories from fat and carbohydrates, increasing non-starchy vegetable intake, elminating sugary beverages, reducing portion sizes, decreasing night time snacking behaviors, etc. He has not met weight loss goals; he is up 1.5# since starting with our program. Patient will benefit from participation in pulmonary rehab for nutrition, exercise, and lifestyle modification.      Intervention Plan   Intervention Prescribe, educate and counsel regarding individualized specific dietary modifications aiming towards targeted core components such as weight, hypertension, lipid management, diabetes, heart failure and other comorbidities.;Nutrition handout(s) given to patient.    Expected Outcomes Short Term Goal: Understand basic principles of dietary content, such as calories, fat, sodium, cholesterol and nutrients.;Long Term Goal: Adherence to prescribed nutrition plan.              Nutrition Assessments:  Nutrition Assessments - 12/16/22 1123       Rate Your Plate  Scores   Pre Score 58            MEDIFICTS Score Key: >=70 Need to make dietary changes  40-70 Heart Healthy Diet <= 40 Therapeutic Level Cholesterol Diet  Flowsheet Row PULMONARY REHAB CHRONIC OBSTRUCTIVE PULMONARY DISEASE from 12/16/2022 in [facility]  Picture Your Plate Total Score on Admission 58      Picture Your Plate Scores: <98 Unhealthy dietary pattern with much room for improvement. 41-50 Dietary pattern unlikely to meet recommendations for good health and room for improvement. 51-60 More healthful dietary pattern, with some room for improvement.  >60 Healthy dietary pattern, although there may be some specific behaviors that could be improved.    Nutrition Goals Re-Evaluation:  Nutrition Goals Re-Evaluation     Row Name 12/16/22 1119 01/13/23 1147 02/15/23 1127         Goals   Current Weight 246 lb 0.5 oz (111.6 kg) 246 lb 7.6 oz (111.8 kg) 246 lb 14.6 oz (112 kg)     Comment ferritin 11.8 (prescribed ferrous sulfate 325mg ), No recent A1c or lipid panel (most recent from 2020) no new labs; most recent labs ferritin 11.8 (prescribed ferrous sulfate 325mg ), No recent A1c or lipid panel (most recent from 2020) no new labs; most recent labs ferritin 11.8 (prescribed ferrous sulfate 325mg ), No recent A1c or lipid panel (most recent from 2020)     Expected Outcome Patient will benefit from participation in pulmonary rehab for nutrition, exercise, and lifestyle modification. Goals in progress. Jahmai enjoys a wide variety of foods including fruits and vegetables. While he does cook often, he does buy many convenience foods such as frozen chicken pot pie, etc. He is motivated to lose weight. We discussed strategies for weight losss including decreasing calories from fat and carbohydrates, increasing non-starchy vegetable  intake, elminating sugary beverages, etc. He has maintained his weight since starting with our program. Patient will benefit from participation in pulmonary rehab for nutrition, exercise, and lifestyle modification. Goals in progress. Josejavier enjoys a wide variety of foods including fruits and vegetables. While he does cook often, he does buy many convenience foods such as frozen chicken pot pie, sweets, etc. He is motivated to lose weight. We discussed and reviewed multiple strategies for weight losss including decreasing calories from fat and carbohydrates, increasing non-starchy vegetable intake, elminating sugary beverages, reducing portion sizes, decreasing night time snacking behaviors, etc. He has not met weight loss goals; he is up 1.5# since starting with our program. Patient will benefit from participation in pulmonary rehab for nutrition, exercise, and lifestyle modification.              Nutrition Goals Discharge (Final Nutrition Goals Re-Evaluation):  Nutrition Goals Re-Evaluation - 02/15/23 1127       Goals   Current Weight 246 lb 14.6 oz (112 kg)    Comment no new labs; most recent labs ferritin 11.8 (prescribed ferrous sulfate 325mg ), No recent A1c or lipid panel (most recent from 2020)    Expected Outcome Goals in progress. Taekwon enjoys a wide variety of foods including fruits and vegetables. While he does cook often, he does buy many convenience foods such as frozen chicken pot pie, sweets, etc. He is motivated to lose weight. We discussed and reviewed multiple strategies for weight losss including decreasing calories from fat and carbohydrates, increasing non-starchy vegetable intake, elminating sugary beverages, reducing portion sizes, decreasing night time snacking behaviors, etc. He has not met  weight loss goals; he is up 1.5# since starting with our program. Patient will benefit from participation in pulmonary rehab for nutrition, exercise, and lifestyle modification.              Psychosocial: Target Goals: Acknowledge presence or absence of significant depression and/or stress, maximize coping skills, provide positive support system. Participant is able to verbalize types and ability to use techniques and skills needed for reducing stress and depression.  Initial Review & Psychosocial Screening:  Initial Psych Review & Screening - 12/10/22 0932       Initial Review   Current issues with None Identified      Family Dynamics   Good Support System? Yes      Barriers   Psychosocial barriers to participate in program There are no identifiable barriers or psychosocial needs.             Quality of Life Scores:  Scores of 19 and below usually indicate a poorer quality of life in these areas.  A difference of  2-3 points is a clinically meaningful difference.  A difference of 2-3 points in the total score of the Quality of Life Index has been associated with significant improvement in overall quality of life, self-image, physical symptoms, and general health in studies assessing change in quality of life.  PHQ-9: Review Flowsheet  More data exists      12/10/2022 09/07/2022 06/22/2022 03/26/2021 03/11/2020  Depression screen PHQ 2/9  Decreased Interest 0 0 0 0 0  Down, Depressed, Hopeless 0 0 0 0 0  PHQ - 2 Score 0 0 0 0 0  Altered sleeping 0 - 3 - -  Tired, decreased energy 1 - 3 - -  Change in appetite 1 - 3 - -  Feeling bad or failure about yourself  0 - 0 - -  Trouble concentrating 0 - 0 - -  Moving slowly or fidgety/restless 0 - 0 - -  Suicidal thoughts 0 - 0 - -  PHQ-9 Score 2 - 9 - -  Difficult doing work/chores Not difficult at all - Somewhat difficult - -    Details           Interpretation of Total Score  Total Score Depression Severity:  1-4 = Minimal depression, 5-9 = Mild depression, 10-14 = Moderate depression, 15-19 = Moderately severe depression, 20-27 = Severe depression   Psychosocial Evaluation and Intervention:   Psychosocial Evaluation - 12/10/22 0933       Psychosocial Evaluation & Interventions   Interventions Encouraged to exercise with the program and follow exercise prescription    Comments Inoke denies any psychosocial barriers or concerns at this time    Expected Outcomes For Khanye to participate in PR free of any psychosocial barriers or concerns    Continue Psychosocial Services  No Follow up required             Psychosocial Re-Evaluation:  Psychosocial Re-Evaluation     Row Name 12/24/22 1558 01/17/23 1554 02/16/23 1546         Psychosocial Re-Evaluation   Current issues with None Identified None Identified None Identified     Comments Kenta denies any psychosocial barriers or concerns at this time. Lautaro denies any psychosocial barriers or concerns at this time. At time of re-evaluation Guerdon still denies any psychosocial barriers or concerns at this time.     Expected Outcomes For Nemo to participate in PR free of any psychosocial barriers or concerns For Dymir to participate  in PR free of any psychosocial barriers or concerns For Sanjit to participate in PR free of any psychosocial barriers or concerns     Interventions Encouraged to attend Pulmonary Rehabilitation for the exercise Encouraged to attend Pulmonary Rehabilitation for the exercise Encouraged to attend Pulmonary Rehabilitation for the exercise     Continue Psychosocial Services  No Follow up required No Follow up required No Follow up required              Psychosocial Discharge (Final Psychosocial Re-Evaluation):  Psychosocial Re-Evaluation - 02/16/23 1546       Psychosocial Re-Evaluation   Current issues with None Identified    Comments At time of re-evaluation Bain still denies any psychosocial barriers or concerns at this time.    Expected Outcomes For Quincey to participate in PR free of any psychosocial barriers or concerns    Interventions Encouraged to attend Pulmonary  Rehabilitation for the exercise    Continue Psychosocial Services  No Follow up required             Education: Education Goals: Education classes will be provided on a weekly basis, covering required topics. Participant will state understanding/return demonstration of topics presented.  Learning Barriers/Preferences:  Learning Barriers/Preferences - 12/10/22 0934       Learning Barriers/Preferences   Learning Barriers Sight;Hearing    Learning Preferences Group Instruction;Individual Instruction             Education Topics: Know Your Numbers Group instruction that is supported by a PowerPoint presentation. Instructor discusses importance of knowing and understanding resting, exercise, and post-exercise oxygen saturation, heart rate, and blood pressure. Oxygen saturation, heart rate, blood pressure, rating of perceived exertion, and dyspnea are reviewed along with a normal range for these values.    Exercise for the Pulmonary Patient Group instruction that is supported by a PowerPoint presentation. Instructor discusses benefits of exercise, core components of exercise, frequency, duration, and intensity of an exercise routine, importance of utilizing pulse oximetry during exercise, safety while exercising, and options of places to exercise outside of rehab.  Flowsheet Row PULMONARY REHAB CHRONIC OBSTRUCTIVE PULMONARY DISEASE from 02/17/2023 in [facility] for Heart, Vascular, & Lung Health  Date 02/17/23  Educator EP  Instruction Review Code 1- Verbalizes Understanding       MET Level  Group instruction provided by PowerPoint, verbal discussion, and written material to support subject matter. Instructor reviews what METs are and how to increase METs.  Flowsheet Row PULMONARY REHAB CHRONIC OBSTRUCTIVE PULMONARY DISEASE from 01/20/2023 in [facility] for Heart, Vascular, & Lung Health  Date 01/20/23  Educator EP  Instruction  Review Code 1- Verbalizes Understanding       Pulmonary Medications Verbally interactive group education provided by instructor with focus on inhaled medications and proper administration. Flowsheet Row PULMONARY REHAB CHRONIC OBSTRUCTIVE PULMONARY DISEASE from 02/10/2023 in [facility] for [facility], Vascular, & Lung [facility]  Date 02/10/23  Educator RT  Instruction Review Code 1- Verbalizes Understanding       Anatomy and Physiology of the Respiratory System Group instruction provided by PowerPoint, verbal discussion, and written material to support subject matter. Instructor reviews respiratory cycle and anatomical components of the respiratory system and their functions. Instructor also reviews differences in obstructive and restrictive respiratory diseases with examples of each.  Flowsheet Row PULMONARY REHAB CHRONIC OBSTRUCTIVE PULMONARY DISEASE from 02/03/2023 in [facility] for Heart, Vascular, & Lung Health  Date 02/03/23  Educator RT  Instruction Review Code 1- Verbalizes Understanding       Oxygen Safety Group instruction provided by PowerPoint, verbal discussion, and written material to support subject matter. There is an overview of "What is Oxygen" and "Why do we need it".  Instructor also reviews how to create a safe environment for oxygen use, the importance of using oxygen as prescribed, and the risks of noncompliance. There is a brief discussion on traveling with oxygen and resources the patient may utilize.   Oxygen Use Group instruction provided by PowerPoint, verbal discussion, and written material to discuss how supplemental oxygen is prescribed and different types of oxygen supply systems. Resources for more information are provided.  Flowsheet Row PULMONARY REHAB CHRONIC OBSTRUCTIVE PULMONARY DISEASE from 12/16/2022 in [facility] for Heart, Vascular, & Lung Health  Date 12/16/22  Educator RT   Instruction Review Code 1- Verbalizes Understanding       Breathing Techniques Group instruction that is supported by demonstration and informational handouts. Instructor discusses the benefits of pursed lip and diaphragmatic breathing and detailed demonstration on how to perform both.  Flowsheet Row PULMONARY REHAB CHRONIC OBSTRUCTIVE PULMONARY DISEASE from 12/23/2022 in [facility] for Heart, Vascular, & Lung Health  Date 12/23/22  Educator RN  Instruction Review Code 1- Verbalizes Understanding        Risk Factor Reduction Group instruction that is supported by a PowerPoint presentation. Instructor discusses the definition of a risk factor, different risk factors for pulmonary disease, and how the heart and lungs work together. Flowsheet Row PULMONARY REHAB CHRONIC OBSTRUCTIVE PULMONARY DISEASE from 01/13/2023 in [facility] for Heart, Vascular, & Lung Health  Date 01/13/23  Educator EP  Instruction Review Code 1- Verbalizes Understanding       Pulmonary Diseases Group instruction provided by PowerPoint, verbal discussion, and written material to support subject matter. Instructor gives an overview of the different type of pulmonary diseases. There is also a discussion on risk factors and symptoms as well as ways to manage the diseases.   Stress and Energy Conservation Group instruction provided by PowerPoint, verbal discussion, and written material to support subject matter. Instructor gives an overview of stress and the impact it can have on the body. Instructor also reviews ways to reduce stress. There is also a discussion on energy conservation and ways to conserve energy throughout the day.   Warning Signs and Symptoms Group instruction provided by PowerPoint, verbal discussion, and written material to support subject matter. Instructor reviews warning signs and symptoms of stroke, heart attack, cold and flu. Instructor also  reviews ways to prevent the spread of infection.   Other Education Group or individual verbal, written, or video instructions that support the educational goals of the pulmonary rehab program. Flowsheet Row PULMONARY REHAB CHRONIC OBSTRUCTIVE PULMONARY DISEASE from 01/27/2023 in [facility] for Heart, Vascular, & Lung Health  Date 01/27/23  Educator RT  Instruction Review Code 1- Verbalizes Understanding        Knowledge Questionnaire Score:  Knowledge Questionnaire Score - 12/10/22 1001       Knowledge Questionnaire Score   Pre Score 15/18             Core Components/Risk Factors/Patient Goals at Admission:  Personal Goals and Risk Factors at Admission - 12/10/22 0935       Core Components/Risk Factors/Patient Goals on Admission    Weight Management Yes;Weight Loss    Intervention Weight Management: Develop  a combined nutrition and exercise program designed to reach desired caloric intake, while maintaining appropriate intake of nutrient and fiber, sodium and fats, and appropriate energy expenditure required for the weight goal.;Weight Management: Provide education and appropriate resources to help participant work on and attain dietary goals.;Weight Management/Obesity: Establish reasonable short term and long term weight goals.;Obesity: Provide education and appropriate resources to help participant work on and attain dietary goals.    Expected Outcomes Short Term: Continue to assess and modify interventions until short term weight is achieved;Long Term: Adherence to nutrition and physical activity/exercise program aimed toward attainment of established weight goal;Weight Maintenance: Understanding of the daily nutrition guidelines, which includes 25-35% calories from fat, 7% or less cal from saturated fats, less than 200mg  cholesterol, less than 1.5gm of sodium, & 5 or more servings of fruits and vegetables daily;Weight Loss: Understanding of general  recommendations for a balanced deficit meal plan, which promotes 1-2 lb weight loss per week and includes a negative energy balance of [phone] kcal/d;Understanding recommendations for meals to include 15-35% energy as protein, 25-35% energy from fat, 35-60% energy from carbohydrates, less than 200mg  of dietary cholesterol, 20-35 gm of total fiber daily;Understanding of distribution of calorie intake throughout the day with the consumption of 4-5 meals/snacks    Improve shortness of breath with ADL's Yes    Intervention Provide education, individualized exercise plan and daily activity instruction to help decrease symptoms of SOB with activities of daily living.    Expected Outcomes Short Term: Improve cardiorespiratory fitness to achieve a reduction of symptoms when performing ADLs;Long Term: Be able to perform more ADLs without symptoms or delay the onset of symptoms    Increase knowledge of respiratory medications and ability to use respiratory devices properly  Yes    Expected Outcomes Long Term: Maintain appropriate use of medications, inhalers, and oxygen therapy.;Short Term: Achieves understanding of medications use. Understands that oxygen is a medication prescribed by physician. Demonstrates appropriate use of inhaler and oxygen therapy.             Core Components/Risk Factors/Patient Goals Review:   Goals and Risk Factor Review     Row Name 12/24/22 1559 01/17/23 1554 02/16/23 1546         Core Components/Risk Factors/Patient Goals Review   Personal Goals Review Weight Management/Obesity;Improve shortness of breath with ADL's;Develop more efficient breathing techniques such as purse lipped breathing and diaphragmatic breathing and practicing self-pacing with activity.;Increase knowledge of respiratory medications and ability to use respiratory devices properly. Weight Management/Obesity;Improve shortness of breath with ADL's;Develop more efficient breathing techniques such as purse  lipped breathing and diaphragmatic breathing and practicing self-pacing with activity. Weight Management/Obesity;Improve shortness of breath with ADL's;Develop more efficient breathing techniques such as purse lipped breathing and diaphragmatic breathing and practicing self-pacing with activity.     Review Goal progressing for weight loss. Braelon has been working with our dietician for weight loss goals. Goal progressing on improving her shortness of breath with ADLs. Goal progressing on developing more efficient breathing techniques such as purse lipped breathing and diaphragmatic breathing; and practicing self-pacing with activity.He has to be reminded to use purse lip breathing when he gets short of breath.  Goal progressing for increasing knowledge of respiratory medications and ability to use respiratory devices properly. We will continue to monitor Ajahni's progress throughout the program. Goal progressing for weight loss. Greyston has been working with our dietician for weight loss goals. Goal progressing on improving her shortness of breath with ADLs. Goal progressing on  developing more efficient breathing techniques such as purse lipped breathing and diaphragmatic breathing; and practicing self-pacing with activity.He has to be reminded to use purse lip breathing when he gets short of breath. Goal met for increasing knowledge of respiratory medications and ability to use respiratory devices properly. We will continue to monitor Manpreet's progress throughout the program. Goal not met for weight loss. Ladarrion has been working with our dietician for weight loss goals but is up from his starting weight. He is still trying to cut calories and increase fruits and vegetables. Goal progressing on improving her shortness of breath with ADLs. Cainan's oxygen level has maintained >88% on room air while his METs and workload have increased. He states he can do "more things and not get so winded". Goal met on  developing more efficient breathing techniques such as purse lipped breathing and diaphragmatic breathing; and practicing self-pacing with activity. Kayne can initiate the PLB on his own while exercising. He also knows how to self-pace and take breaks while walking the track. We will continue to monitor Nery's progress throughout the program.     Expected Outcomes See admission goals See admission goals For Nandan to lose weight and improve his SOB of ADLs              Core Components/Risk Factors/Patient Goals at Discharge (Final Review):   Goals and Risk Factor Review - 02/16/23 1546       Core Components/Risk Factors/Patient Goals Review   Personal Goals Review Weight Management/Obesity;Improve shortness of breath with ADL's;Develop more efficient breathing techniques such as purse lipped breathing and diaphragmatic breathing and practicing self-pacing with activity.    Review Goal not met for weight loss. Waverly has been working with our dietician for weight loss goals but is up from his starting weight. He is still trying to cut calories and increase fruits and vegetables. Goal progressing on improving her shortness of breath with ADLs. Dameian's oxygen level has maintained >88% on room air while his METs and workload have increased. He states he can do "more things and not get so winded". Goal met on developing more efficient breathing techniques such as purse lipped breathing and diaphragmatic breathing; and practicing self-pacing with activity. Zinedine can initiate the PLB on his own while exercising. He also knows how to self-pace and take breaks while walking the track. We will continue to monitor Kaysin's progress throughout the program.    Expected Outcomes For Tres to lose weight and improve his SOB of ADLs             ITP Comments: Pt is making expected progress toward Pulmonary Rehab goals after completing 20 sessions. Recommend continued exercise, life style  modification, education, and utilization of breathing techniques to increase stamina and strength, while also decreasing shortness of breath with exertion.  Dr. Mechele Collin is Medical Director for Pulmonary Rehab at [facility].

## 2023-02-24 ENCOUNTER — Encounter: Payer: Self-pay | Admitting: Cardiology

## 2023-02-24 ENCOUNTER — Encounter (HOSPITAL_COMMUNITY)
Admission: RE | Admit: 2023-02-24 | Discharge: 2023-02-24 | Disposition: A | Discharge status: Home / Self Care | Payer: Medicare Other | Source: Ambulatory Visit | Attending: Pulmonary Disease | Admitting: Pulmonary Disease

## 2023-02-24 ENCOUNTER — Telehealth: Payer: Self-pay | Admitting: Pharmacy Technician

## 2023-02-24 DIAGNOSIS — U099 Post covid-19 condition, unspecified: Secondary | ICD-10-CM

## 2023-02-24 DIAGNOSIS — R0602 Shortness of breath: Secondary | ICD-10-CM | POA: Diagnosis not present

## 2023-02-24 DIAGNOSIS — Z5986 Financial insecurity: Secondary | ICD-10-CM

## 2023-02-24 DIAGNOSIS — I4891 Unspecified atrial fibrillation: Secondary | ICD-10-CM | POA: Diagnosis not present

## 2023-02-24 DIAGNOSIS — R9431 Abnormal electrocardiogram [ECG] [EKG]: Secondary | ICD-10-CM | POA: Diagnosis not present

## 2023-02-24 DIAGNOSIS — I4892 Unspecified atrial flutter: Secondary | ICD-10-CM

## 2023-02-24 DIAGNOSIS — J449 Chronic obstructive pulmonary disease, unspecified: Secondary | ICD-10-CM | POA: Diagnosis not present

## 2023-02-24 DIAGNOSIS — Z87891 Personal history of nicotine dependence: Secondary | ICD-10-CM | POA: Diagnosis not present

## 2023-02-24 NOTE — Progress Notes (Signed)
Incomplete Session Note  Patient Details  Name: Brendan Hopkins MRN: [id number] Date of Birth: 11/25/1935 Referring Provider:   Doristine Devoid Pulmonary Rehab Walk Test from 12/10/2022 in [facility]  Referring Provider Brendan Hopkins did not complete his rehab session.  Pt arrived at [facility], pre-exercise VS 114/70, HR 69 O2 97% on 2L [place]. Pt warmed up with class and was exercising on his first station, Nu Step. Exercise O2 93% on 2L, HR 142, radial pulse irregular. RN had pt stop exercising, reviewed med list and reviewed pt with Lorin Picket, NP. Pt placed on EKG, Afib in the 140s, asymptomatic. Advised for 12 lead and to follow up with cards. Pt unsure if taking Toprol-Xl or not. Byrd Hesselbach, RN made pt appt with cards for pt next Tuesday. Pt understands without assistance.

## 2023-02-24 NOTE — Progress Notes (Signed)
[facility])                                            [facility] Quality Pharmacy Team    02/24/2023  Brendan Hopkins [birth year] [id number]  Received both patient and provider portion(s) of patient assistance application(s) for Xarelto and Trelegy. Faxed completed application and required documents into [employer] and [employer] respectively.   [name], CPhT [place]  Office: [phone] Fax: [phone] Email: [email or web address]

## 2023-02-24 NOTE — Telephone Encounter (Signed)
Error

## 2023-03-01 ENCOUNTER — Encounter (HOSPITAL_COMMUNITY): Payer: Medicare Other

## 2023-03-01 ENCOUNTER — Encounter: Payer: Self-pay | Admitting: Physician Assistant

## 2023-03-01 ENCOUNTER — Ambulatory Visit: Payer: Medicare Other | Attending: Physician Assistant | Admitting: Physician Assistant

## 2023-03-01 VITALS — BP 124/60 | HR 68 | Ht 71.0 in | Wt 246.0 lb

## 2023-03-01 DIAGNOSIS — I10 Essential (primary) hypertension: Secondary | ICD-10-CM | POA: Diagnosis not present

## 2023-03-01 DIAGNOSIS — I480 Paroxysmal atrial fibrillation: Secondary | ICD-10-CM

## 2023-03-01 DIAGNOSIS — J449 Chronic obstructive pulmonary disease, unspecified: Secondary | ICD-10-CM | POA: Diagnosis not present

## 2023-03-01 DIAGNOSIS — Z8673 Personal history of transient ischemic attack (TIA), and cerebral infarction without residual deficits: Secondary | ICD-10-CM | POA: Diagnosis not present

## 2023-03-01 NOTE — Patient Instructions (Signed)
Medication Instructions:  NO CHANGES *If you need a refill on your cardiac medications before your next appointment, please call your pharmacy*   Lab Work: NO LABS If you have labs (blood work) drawn today and your tests are completely normal, you will receive your results only by: MyChart Message (if you have MyChart) OR A paper copy in the mail If you have any lab test that is abnormal or we need to change your treatment, we will call you to review the results.   Testing/Procedures:[address]  Your physician has requested that you have an echocardiogram. Echocardiography is a painless test that uses sound waves to create images of your heart. It provides your doctor with information about the size and shape of your heart and how well your heart's chambers and valves are working. This procedure takes approximately one hour. There are no restrictions for this procedure. Please do NOT wear cologne, perfume, aftershave, or lotions (deodorant is allowed). Please arrive 15 minutes prior to your appointment time.    Follow-Up: At [facility], you and your health needs are our priority.  As part of our continuing mission to provide you with exceptional heart care, we have created designated Provider Care Teams.  These Care Teams include your primary Cardiologist (physician) and Advanced Practice Providers (APPs -  Physician Assistants and Nurse Practitioners) who all work together to provide you with the care you need, when you need it.  We recommend signing up for the patient portal called "MyChart".  Sign up information is provided on this After Visit Summary.  MyChart is used to connect with patients for Virtual Visits (Telemedicine).  Patients are able to view lab/test results, encounter notes, upcoming appointments, etc.  Non-urgent messages can be sent to your provider as well.   To learn more about what you can do with MyChart, go to [email or web address].     Your next appointment:   2 month(s)  Provider:   Azalee Course, PA

## 2023-03-01 NOTE — Progress Notes (Signed)
Cardiology Office Note:  .   Date:  03/01/2023  ID:  Brendan Hopkins, DOB 1935-09-16, MRN [id number] PCP: Donita Brooks, MD  [facility] Providers Cardiologist:  Olga Millers, MD    History of Present Illness: .   Brendan Hopkins is a 87 y.o. male with past medical history of paroxysmal atrial fibrillation, chronic dyspnea, hypertension, TIA, DVT, COPD, former tobacco use, kidney stone and GERD.  Echocardiogram in July 2022 showed normal EF, grade 1 DD, trace AI, mildly dilated aortic root.  Lower extremity venous Doppler in October 2022 showed no evidence of DVT.  He does have a prior history of DVT in postop setting.  He has chronic dyspnea on exertion following COVID infection in 2021.  He has chronic mild bilateral lower extremity edema.  Myoview in March 2023 was low risk, no evidence of ischemia, EF 55 to 60%.  He was diagnosed with A-fib by his PCP in August 2023 and was started on Xarelto and metoprolol.  He has no cardiac awareness of A-fib.  He was last seen by Bernadene Person NP on 02/01/2022.  EKG at the time showed that he was holding sinus rhythm with underlying right bundle branch block.  It was offered for the patient to wear a 30-day heart monitor to assess the A-fib burden, however patient declined at the time.  He was hospitalized in April 2024 with pneumonia and has been placed on home oxygen.  Recently, he was at pulmonary rehab on 02/24/2023 and was noted to be in A-fib with RVR with heart rate of 140.  EKG was personally reviewed which showed atrial flutter.  He presents today for for follow-up.  Repeat EKG shows he is back in normal sinus rhythm with underlying right bundle branch block.  He does appears to have some degree of conduction delays.  He has been compliant with Toprol-XL and Xarelto.  I recommended continue on the current therapy.  We will order a repeat echocardiogram.  If A-fib/atrial flutter become more frequent, I would recommend a heart monitor to  assess the A-fib burden.  Otherwise we will see him back in 2 months for reassessment.  ROS:   He denies chest pain, palpitations, dyspnea, pnd, orthopnea, n, v, dizziness, syncope, edema, weight gain, or early satiety. All other systems reviewed and are otherwise negative except as noted above.    Studies Reviewed: Marland Kitchen   EKG Interpretation Date/Time:  Tuesday March 01 2023 09:23:03 EDT Ventricular Rate:  67 PR Interval:    QRS Duration:  156 QT Interval:  414 QTC Calculation: 437 R Axis:   -45  Text Interpretation: Normal sinus rhythm Right bundle branch block Left anterior fascicular block Bifascicular block When compared with ECG of 24-Feb-2023 11:58, Current undetermined rhythm precludes rhythm comparison, needs review Confirmed by Azalee Course [phone]) on 03/01/2023 10:21:29 AM    Cardiac Studies & Procedures     STRESS TESTS  MYOCARDIAL PERFUSION IMAGING 07/23/2021  Narrative   The study is normal. The study is low risk.   No ST deviation was noted.   LV perfusion is normal.   Left ventricular function is normal. Nuclear stress EF: 60 %. The left ventricular ejection fraction is normal (55-65%). End diastolic cavity size is normal.   Prior study not available for comparison.   ECHOCARDIOGRAM  ECHOCARDIOGRAM COMPLETE 12/17/2020  Narrative ECHOCARDIOGRAM REPORT    Patient Name:   Brendan Hopkins Date of Exam: 12/17/2020 Medical Rec #:  [id number]  Height:       71.0 in Accession #:    [id number]        Weight:       245.0 lb Date of Birth:  [birth year]        BSA:          2.298 m Patient Age:    84 years          BP:           138/84 mmHg Patient Gender: M                 HR:           62 bpm. Exam Location:  [address]  Procedure: 2D Echo, Cardiac Doppler, Color Doppler and Intracardiac Opacification Agent  Indications:    R06.00 Dyspnea  History:        Patient has prior history of Echocardiogram examinations, most recent 02/02/2019. Risk  Factors:Hypertension and Obesity. Pneumonia. DVT. COPD. Shortness of breath. TIA. RBBB.  Sonographer:    Sedonia Small Rodgers-Jones RDCS Referring Phys: [id number] [address] T PICKARD  IMPRESSIONS   1. Left ventricular ejection fraction, by estimation, is 55%. The left ventricle has low normal function. The left ventricle has no regional wall motion abnormalities. There is moderate concentric left ventricular hypertrophy. Left ventricular diastolic parameters are consistent with Grade I diastolic dysfunction (impaired relaxation). 2. Right ventricular systolic function is normal. The right ventricular size is normal. Tricuspid regurgitation signal is inadequate for assessing PA pressure. 3. The mitral valve is abnormal. No evidence of mitral valve regurgitation. No evidence of mitral stenosis. Moderate mitral annular calcification. 4. The aortic valve is tricuspid. There is moderate calcification of the aortic valve. Aortic valve regurgitation is trivial. Aortic valve sclerosis/calcification is present, without any evidence of aortic stenosis. 5. Aortic dilatation noted. There is mild dilatation of the aortic root, measuring 40 mm. There is borderline dilatation of the ascending aorta, measuring 39 mm. 6. The inferior vena cava is normal in size with greater than 50% respiratory variability, suggesting right atrial pressure of 3 mmHg.  Comparison(s): A prior study was performed on 02/02/2019. Prior images reviewed side by side. Slight increase in aortic sizes.  FINDINGS Left Ventricle: Left ventricular ejection fraction, by estimation, is 55%. The left ventricle has low normal function. The left ventricle has no regional wall motion abnormalities. Definity contrast agent was given IV to delineate the left ventricular endocardial borders. The left ventricular internal cavity size was normal in size. There is moderate concentric left ventricular hypertrophy. Left ventricular diastolic parameters are consistent  with Grade I diastolic dysfunction (impaired relaxation).  Right Ventricle: The right ventricular size is normal. No increase in right ventricular wall thickness. Right ventricular systolic function is normal. Tricuspid regurgitation signal is inadequate for assessing PA pressure.  Left Atrium: Left atrial size was normal in size.  Right Atrium: Right atrial size was normal in size.  Pericardium: There is no evidence of pericardial effusion.  Mitral Valve: The mitral valve is abnormal. Moderate mitral annular calcification. No evidence of mitral valve regurgitation. No evidence of mitral valve stenosis.  Tricuspid Valve: The tricuspid valve is normal in structure. Tricuspid valve regurgitation is not demonstrated. No evidence of tricuspid stenosis.  Aortic Valve: The aortic valve is tricuspid. There is moderate calcification of the aortic valve. There is mild aortic valve annular calcification. Aortic valve regurgitation is mild. Aortic regurgitation PHT measures 678 msec. Mild to moderate aortic valve sclerosis/calcification is present, without any evidence of aortic  stenosis. Aortic valve mean gradient measures 4.0 mmHg. Aortic valve peak gradient measures 7.2 mmHg. Aortic valve area, by VTI measures 3.08 cm.  Pulmonic Valve: The pulmonic valve was not well visualized. Pulmonic valve regurgitation is not visualized. No evidence of pulmonic stenosis.  Aorta: Aortic dilatation noted. There is mild dilatation of the aortic root, measuring 40 mm. There is borderline dilatation of the ascending aorta, measuring 39 mm.  Venous: The inferior vena cava is normal in size with greater than 50% respiratory variability, suggesting right atrial pressure of 3 mmHg.  IAS/Shunts: The atrial septum is grossly normal.   LEFT VENTRICLE PLAX 2D LVIDd:         4.10 cm  Diastology LVIDs:         2.90 cm  LV e' medial:    6.31 cm/s LV PW:         1.50 cm  LV E/e' medial:  9.7 LV IVS:        1.50 cm  LV e'  lateral:   6.42 cm/s LVOT diam:     2.20 cm  LV E/e' lateral: 9.5 LV SV:         95 LV SV Index:   41 LVOT Area:     3.80 cm   RIGHT VENTRICLE             IVC RV Basal diam:  4.10 cm     IVC diam: 1.70 cm RV S prime:     11.00 cm/s TAPSE (M-mode): 2.9 cm  LEFT ATRIUM             Index       RIGHT ATRIUM           Index LA diam:        5.00 cm 2.18 cm/m  RA Area:     16.90 cm LA Vol (A2C):   58.5 ml 25.45 ml/m RA Volume:   45.70 ml  19.88 ml/m LA Vol (A4C):   41.6 ml 18.10 ml/m LA Biplane Vol: 52.2 ml 22.71 ml/m AORTIC VALVE AV Area (Vmax):    3.03 cm AV Area (Vmean):   2.92 cm AV Area (VTI):     3.08 cm AV Vmax:           134.25 cm/s AV Vmean:          90.275 cm/s AV VTI:            0.308 m AV Peak Grad:      7.2 mmHg AV Mean Grad:      4.0 mmHg LVOT Vmax:         107.00 cm/s LVOT Vmean:        69.400 cm/s LVOT VTI:          0.250 m LVOT/AV VTI ratio: 0.81 AI PHT:            678 msec  AORTA Ao Root diam: 4.00 cm Ao Asc diam:  3.90 cm  MITRAL VALVE MV Area (PHT): 2.32 cm     SHUNTS MV Decel Time: 327 msec     Systemic VTI:  0.25 m MV E velocity: 61.10 cm/s   Systemic Diam: 2.20 cm MV A velocity: 115.00 cm/s MV E/A ratio:  0.53  Riley Lam MD Electronically signed by Riley Lam MD Signature Date/Time: 12/17/2020/11:35:45 AM    Final             Risk Assessment/Calculations:    CHA2DS2-VASc Score = 3   This indicates a  3.2% annual risk of stroke. The patient's score is based upon: CHF History: 0 HTN History: 1 Diabetes History: 0 Stroke History: 0 Vascular Disease History: 0 Age Score: 2 Gender Score: 0            Physical Exam:   VS:  BP 124/60 (BP Location: Left Arm, Patient Position: Sitting, Cuff Size: Large)   Pulse 68   Ht 5\' 11"  (1.803 m)   Wt 246 lb (111.6 kg)   BMI 34.31 kg/m    Wt Readings from Last 3 Encounters:  03/01/23 246 lb (111.6 kg)  02/22/23 249 lb 12.5 oz (113.3 kg)  02/17/23 246 lb (111.6  kg)    GEN: Well nourished, well developed in no acute distress NECK: No JVD; No carotid bruits CARDIAC: RRR, no murmurs, rubs, gallops RESPIRATORY:  Clear to auscultation without rales, wheezing or rhonchi  ABDOMEN: Soft, non-tender, non-distended EXTREMITIES:  No edema; No deformity   ASSESSMENT AND PLAN: .    Atrial Flutter Self-resolved atrial flutter as evidenced by EKG. No cardiac awareness of atrial fibrillation or atrial flutter. Compliant with Metoprolol Succinate. -Order repeat echocardiogram. -Schedule follow-up in 2-3 months.  Hypertension: Blood pressure well-controlled  History of TIA: No recent recurrence.  On aspirin  Chronic Obstructive Pulmonary Disease (COPD) Chronic dyspnea on exertion. Utilizes 2-3 liters of home oxygen as needed and at night. -Continue current management under Pulmonology service.       Dispo: Follow-up in 2 to 3 months  Signed, Azalee Course, [place]

## 2023-03-02 ENCOUNTER — Telehealth: Payer: Self-pay | Admitting: Pharmacy Technician

## 2023-03-02 DIAGNOSIS — Z5986 Financial insecurity: Secondary | ICD-10-CM

## 2023-03-02 NOTE — Progress Notes (Signed)
[facility])                                            [facility] Quality Pharmacy Team    03/02/2023  Brendan Hopkins [birth year] [id number]  Two care coordination calls made to [facility] in regard to Xarelto application and [employer] in regard to Trelegy application.  First call made to [facility]. Spoke to [place] in regard to Xarelto application. She informs patient has been DENIED for the  [place] program as patient's insurance pays for a percentage of the medication cost. She informs there is another program call Xarelto withME for which patient may qualify which will reduce the out of pocket amount he has to pay for the Xarelto but the program is not like Glenna Durand' in which the patient would get it for free. She informs the name for the program is Xarelto withMe. She informs they should reach out to the patient in the next 24 hours or patient may call them at [phone].  Second call made to [employer]. Spoke to [place] who informs patient is APPROVED 02/28/2023-05/24/2023. She informs patient will receive 1-3 month supply for this enrollment period and the medication will be shipped to the patient's home address.  Successful outreach to patient. HIPAA verified. Informed patient of the above information. Patient verbalized understanding.  [name], CPhT [place]  Office: [phone] Fax: [phone] Email: [email or web address]

## 2023-03-03 ENCOUNTER — Ambulatory Visit (HOSPITAL_BASED_OUTPATIENT_CLINIC_OR_DEPARTMENT_OTHER): Payer: Medicare Other | Admitting: Pulmonary Disease

## 2023-03-03 ENCOUNTER — Encounter (HOSPITAL_BASED_OUTPATIENT_CLINIC_OR_DEPARTMENT_OTHER): Payer: Self-pay | Admitting: Pulmonary Disease

## 2023-03-03 ENCOUNTER — Encounter (HOSPITAL_COMMUNITY)
Admission: RE | Admit: 2023-03-03 | Discharge: 2023-03-03 | Disposition: A | Discharge status: Home / Self Care | Payer: Medicare Other | Source: Ambulatory Visit | Attending: Pulmonary Disease | Admitting: Pulmonary Disease

## 2023-03-03 VITALS — BP 114/78 | HR 74 | Resp 20 | Ht 71.0 in | Wt 251.0 lb

## 2023-03-03 DIAGNOSIS — Z87891 Personal history of nicotine dependence: Secondary | ICD-10-CM | POA: Diagnosis not present

## 2023-03-03 DIAGNOSIS — I4891 Unspecified atrial fibrillation: Secondary | ICD-10-CM | POA: Diagnosis not present

## 2023-03-03 DIAGNOSIS — J9611 Chronic respiratory failure with hypoxia: Secondary | ICD-10-CM

## 2023-03-03 DIAGNOSIS — Z7189 Other specified counseling: Secondary | ICD-10-CM | POA: Diagnosis not present

## 2023-03-03 DIAGNOSIS — J449 Chronic obstructive pulmonary disease, unspecified: Secondary | ICD-10-CM

## 2023-03-03 DIAGNOSIS — R0602 Shortness of breath: Secondary | ICD-10-CM

## 2023-03-03 DIAGNOSIS — R9431 Abnormal electrocardiogram [ECG] [EKG]: Secondary | ICD-10-CM | POA: Diagnosis not present

## 2023-03-03 DIAGNOSIS — U099 Post covid-19 condition, unspecified: Secondary | ICD-10-CM | POA: Diagnosis not present

## 2023-03-03 DIAGNOSIS — I4892 Unspecified atrial flutter: Secondary | ICD-10-CM | POA: Diagnosis not present

## 2023-03-03 MED ORDER — DOXYCYCLINE HYCLATE 100 MG PO TABS: 100.0000 mg | ORAL_TABLET | Freq: Two times a day (BID) | ORAL | 0 refills | Status: DC

## 2023-03-03 MED ORDER — PREDNISONE 10 MG PO TABS: ORAL_TABLET | ORAL | 0 refills | Status: AC
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
## 2023-03-03 NOTE — Patient Instructions (Addendum)
COPD with emphysema COPD exacerbation, recurrent Mild hemoptysis likely secondary to bronchitis - resolved but now recurrent --Repeat prednisone taper and doxycycline. Pick up at your pharmacy --CONTINUE Trelegy 200 ONE puff ONCE a day --If you continue to cough up blood despite antibiotics, please contact our office for re-evaluation. If symptoms are severe then you need to go the ED  Chronic hypoxemic respiratory failure --Wear 2L-3L O2 with activity and sleep  DNR status We discussed goals of care and due to his advanced lung disease he wishes to be a DNR

## 2023-03-03 NOTE — Progress Notes (Signed)
Daily Session Note  Patient Details  Name: Brendan Hopkins MRN: [id number] Date of Birth: August 24, 1935 Referring Provider:   Doristine Devoid Pulmonary Rehab Walk Test from 12/10/2022 in [facility]  Referring Provider [name]       Encounter Date: 03/03/2023  Check In:  Session Check In - 03/03/23 1200       Check-In   Supervising physician immediately available to respond to emergencies [facility] MD immediately available    Physician(s) Jari Favre, NP    Location [facility]    Staff Present Durel Salts, Zella Richer, MS, ACSM-CEP, Exercise Physiologist;Randi Dionisio Paschal, ACSM-CEP, Exercise Physiologist;Mary Gerre Scull, RN, BSN    Virtual Visit No    Medication changes reported     No    Fall or balance concerns reported    No    Tobacco Cessation No Change    Warm-up and Cool-down Performed as group-led instruction    Resistance Training Performed Yes    VAD Patient? No    PAD/SET Patient? No      Pain Assessment   Currently in Pain? No/denies    Pain Score 0-No pain    Multiple Pain Sites No             Capillary Blood Glucose: No results found for this or any previous visit (from the past 24 hour(s)).    Social History   Tobacco Use  Smoking Status Former   Current packs/day: 0.00   Average packs/day: 2.0 packs/day for 57.0 years (114.0 ttl pk-yrs)   Types: Cigarettes   Start date: 05/24/1950   Quit date: 05/25/2007   Years since quitting: 15.7  Smokeless Tobacco Never  Tobacco Comments   Does not qualify due to age.     Goals Met:  Proper associated with RPD/PD & O2 Sat Independence with exercise equipment Exercise tolerated well No report of concerns or symptoms today Strength training completed today  Goals Unmet:  Not Applicable  Comments: Service time is from 1010 to 1140.    Dr. Mechele Collin is Medical Director for Pulmonary Rehab at [facility].

## 2023-03-08 ENCOUNTER — Encounter (HOSPITAL_COMMUNITY)
Admission: RE | Admit: 2023-03-08 | Discharge: 2023-03-08 | Disposition: A | Discharge status: Home / Self Care | Payer: Medicare Other | Source: Ambulatory Visit | Attending: Pulmonary Disease

## 2023-03-08 VITALS — Wt 248.7 lb

## 2023-03-08 DIAGNOSIS — R0602 Shortness of breath: Secondary | ICD-10-CM | POA: Diagnosis not present

## 2023-03-08 DIAGNOSIS — U099 Post covid-19 condition, unspecified: Secondary | ICD-10-CM | POA: Diagnosis not present

## 2023-03-08 DIAGNOSIS — I4891 Unspecified atrial fibrillation: Secondary | ICD-10-CM | POA: Diagnosis not present

## 2023-03-08 DIAGNOSIS — R9431 Abnormal electrocardiogram [ECG] [EKG]: Secondary | ICD-10-CM | POA: Diagnosis not present

## 2023-03-08 DIAGNOSIS — J449 Chronic obstructive pulmonary disease, unspecified: Secondary | ICD-10-CM | POA: Diagnosis not present

## 2023-03-08 DIAGNOSIS — Z87891 Personal history of nicotine dependence: Secondary | ICD-10-CM | POA: Diagnosis not present

## 2023-03-08 DIAGNOSIS — I4892 Unspecified atrial flutter: Secondary | ICD-10-CM | POA: Diagnosis not present

## 2023-03-08 NOTE — Progress Notes (Signed)
Daily Session Note  Patient Details  Name: Brendan Hopkins MRN: [id number] Date of Birth: [birth year] Referring Provider:   Doristine Devoid Pulmonary Rehab Walk Test from 12/10/2022 in [facility]  Referring Provider [name]       Encounter Date: 03/08/2023  Check In:  Session Check In - 03/08/23 1120       Check-In   Supervising physician immediately available to respond to emergencies [facility] MD immediately available    Physician(s) Jari Favre, NP    Location [facility]    Staff Present Durel Salts, RT;Randi Idelle Crouch BS, ACSM-CEP, Exercise Physiologist;Mary Gerre Scull, RN, BSN;Samantha Belarus, RD, LDN;Johnny Hale Bogus, MS, Exercise Physiologist    Virtual Visit No    Medication changes reported     No    Fall or balance concerns reported    No    Tobacco Cessation No Change    Warm-up and Cool-down Performed as group-led instruction    Resistance Training Performed Yes    VAD Patient? No    PAD/SET Patient? No      Pain Assessment   Currently in Pain? No/denies    Multiple Pain Sites No             Capillary Blood Glucose: No results found for this or any previous visit (from the past 24 hour(s)).   Exercise Prescription Changes - 03/08/23 1100       Response to Exercise   Blood Pressure (Admit) 122/62    Blood Pressure (Exercise) 132/64    Blood Pressure (Exit) 142/60    Heart Rate (Admit) 78 bpm    Heart Rate (Exercise) 100 bpm    Heart Rate (Exit) 79 bpm    Oxygen Saturation (Admit) 94 %    Oxygen Saturation (Exercise) 92 %    Oxygen Saturation (Exit) 94 %    Rating of Perceived Exertion (Exercise) 11    Perceived Dyspnea (Exercise) 1    Duration Continue with 30 min of aerobic exercise without signs/symptoms of physical distress.    Intensity THRR unchanged      Progression   Progression Continue to progress workloads to maintain intensity without signs/symptoms of physical distress.       Resistance Training   Training Prescription Yes    Weight blue bands    Reps 10-15    Time 10 Minutes      Oxygen   Oxygen Continuous    Liters 2      NuStep   Level 5    SPM 76    Minutes 15    METs 2.6      Oxygen   Maintain Oxygen Saturation 88% or higher             Social History   Tobacco Use  Smoking Status Former   Current packs/day: 0.00   Average packs/day: 2.0 packs/day for 57.0 years (114.0 ttl pk-yrs)   Types: Cigarettes   Start date: 05/24/1950   Quit date: 05/25/2007   Years since quitting: 15.7  Smokeless Tobacco Never  Tobacco Comments   Does not qualify due to age.     Goals Met:  Proper associated with RPD/PD & O2 Sat Independence with exercise equipment Exercise tolerated well No report of concerns or symptoms today Strength training completed today  Goals Unmet:  Not Applicable  Comments: Service time is from 1018 to 1140.  Dr. Mechele Collin is Medical Director for Pulmonary Rehab at [facility].

## 2023-03-09 ENCOUNTER — Other Ambulatory Visit: Payer: Self-pay | Admitting: Family Medicine

## 2023-03-09 DIAGNOSIS — H5010 Unspecified exotropia: Secondary | ICD-10-CM | POA: Diagnosis not present

## 2023-03-09 DIAGNOSIS — H52203 Unspecified astigmatism, bilateral: Secondary | ICD-10-CM | POA: Diagnosis not present

## 2023-03-09 DIAGNOSIS — H35373 Puckering of macula, bilateral: Secondary | ICD-10-CM | POA: Diagnosis not present

## 2023-03-09 DIAGNOSIS — Z961 Presence of intraocular lens: Secondary | ICD-10-CM | POA: Diagnosis not present

## 2023-03-09 NOTE — Telephone Encounter (Signed)
Requested Prescriptions  Pending Prescriptions Disp Refills   albuterol (VENTOLIN HFA) 108 (90 Base) MCG/ACT inhaler [Pharmacy Med Name: Albuterol Sulfate HFA 46mcg/actuation Inhalation Aerosol] 6.7 each 0    Sig: Inhale 1 to 2  puffs by mouth every 6 hours as needed for shortness of breath     Pulmonology:  Beta Agonists 2 Failed - 03/09/2023  2:08 AM      Failed - Valid encounter within last 12 months    Recent Outpatient Visits           1 year ago Chronic obstructive pulmonary disease, unspecified COPD type (HCC)   [facility] Pickard, Priscille Heidelberg, MD   1 year ago Left sided sciatica   [facility] Donita Brooks, MD   1 year ago Vertigo   [facility] Donita Brooks, MD   2 years ago S/P total left hip arthroplasty   [facility] Donita Brooks, MD   2 years ago Chronic obstructive pulmonary disease, unspecified COPD type (HCC)   [facility] Pickard, Priscille Heidelberg, MD       Future Appointments             In 1 month [place], [place], [place] [facility] - Last BP in normal range    BP Readings from Last 1 Encounters:  03/03/23 114/78         Passed - Last Heart Rate in normal range    Pulse Readings from Last 1 Encounters:  03/03/23 74

## 2023-03-10 ENCOUNTER — Encounter (HOSPITAL_COMMUNITY)
Admission: RE | Admit: 2023-03-10 | Discharge: 2023-03-10 | Disposition: A | Discharge status: Home / Self Care | Payer: Medicare Other | Source: Ambulatory Visit | Attending: Pulmonary Disease | Admitting: Pulmonary Disease

## 2023-03-10 DIAGNOSIS — J449 Chronic obstructive pulmonary disease, unspecified: Secondary | ICD-10-CM | POA: Diagnosis not present

## 2023-03-10 DIAGNOSIS — R0602 Shortness of breath: Secondary | ICD-10-CM

## 2023-03-10 DIAGNOSIS — I4892 Unspecified atrial flutter: Secondary | ICD-10-CM | POA: Diagnosis not present

## 2023-03-10 DIAGNOSIS — R9431 Abnormal electrocardiogram [ECG] [EKG]: Secondary | ICD-10-CM | POA: Diagnosis not present

## 2023-03-10 DIAGNOSIS — I4891 Unspecified atrial fibrillation: Secondary | ICD-10-CM | POA: Diagnosis not present

## 2023-03-10 DIAGNOSIS — Z87891 Personal history of nicotine dependence: Secondary | ICD-10-CM | POA: Diagnosis not present

## 2023-03-10 DIAGNOSIS — U099 Post covid-19 condition, unspecified: Secondary | ICD-10-CM | POA: Diagnosis not present

## 2023-03-10 NOTE — Progress Notes (Signed)
Daily Session Note  Patient Details  Name: Brendan Hopkins MRN: [id number] Date of Birth: Sep 23, 1935 Referring Provider:   Doristine Devoid Pulmonary Rehab Walk Test from 12/10/2022 in [facility]  Referring Provider [name]       Encounter Date: 03/10/2023  Check In:  Session Check In - 03/10/23 1111       Check-In   Supervising physician immediately available to respond to emergencies [facility] MD immediately available    Physician(s) Jari Favre, NP    Location [facility]    Staff Present Durel Salts, RT;Randi Idelle Crouch BS, ACSM-CEP, Exercise Physiologist;Mary Gerre Scull, RN, BSN;Samantha Belarus, RD, Gaylord Shih, MS, Exercise Physiologist;Kaylee Earlene Plater, MS, ACSM-CEP, Exercise Physiologist    Virtual Visit No    Medication changes reported     No    Fall or balance concerns reported    No    Tobacco Cessation No Change    Warm-up and Cool-down Performed as group-led instruction    Resistance Training Performed Yes    VAD Patient? No    PAD/SET Patient? No      Pain Assessment   Currently in Pain? No/denies    Multiple Pain Sites No             Capillary Blood Glucose: No results found for this or any previous visit (from the past 24 hour(s)).    Social History   Tobacco Use  Smoking Status Former   Current packs/day: 0.00   Average packs/day: 2.0 packs/day for 57.0 years (114.0 ttl pk-yrs)   Types: Cigarettes   Start date: 05/24/1950   Quit date: 05/25/2007   Years since quitting: 15.8  Smokeless Tobacco Never  Tobacco Comments   Does not qualify due to age.     Goals Met:  Proper associated with RPD/PD & O2 Sat Independence with exercise equipment Exercise tolerated well No report of concerns or symptoms today Strength training completed today  Goals Unmet:  Not Applicable  Comments: Service time is from 1014 to 1144.  Dr. Mechele Collin is Medical Director for Pulmonary Rehab at  [facility].

## 2023-03-10 NOTE — Progress Notes (Signed)
Home Exercise Prescription I have reviewed a Home Exercise Prescription with [name]. Adilson is not currently exercising at home. I recomended walking or biking 3-5 days/wk for 30 min/day. Pt can walk his driveway or use his stationary bike at home. I mentioned using both for 15 min each for 3-5 days/wk. Prakash seems motivated to exercise at home. Pt seems in precontemplation phase of behavioral change. I am unsure how likely he is to keep a consistent exercise regimen. The patient stated that their goals were to maintain his current health. We reviewed exercise guidelines, target heart rate during exercise, RPE Scale, weather conditions, endpoints for exercise, warmup and cool down. The patient is encouraged to come to me with any questions. I will continue to follow up with the patient to assist them with progression and safety. Spent 15 min with patient discussing home exercise plan and goals  Joya San, MS, ACSM-CEP 03/10/2023 3:44 PM

## 2023-03-11 NOTE — Progress Notes (Signed)
Discharge Progress Report  Patient Details  Name: Brendan Hopkins MRN: [id number] Date of Birth: 1935/09/04 Referring Provider:   Doristine Devoid Pulmonary Rehab Walk Test from 12/10/2022 in [facility]  Referring Provider [name]        Number of Visits: 24  Reason for Discharge:  Patient has met program and personal goals.  Smoking History:  Social History   Tobacco Use  Smoking Status Former   Current packs/day: 0.00   Average packs/day: 2.0 packs/day for 57.0 years (114.0 ttl pk-yrs)   Types: Cigarettes   Start date: 05/24/1950   Quit date: 05/25/2007   Years since quitting: 15.8  Smokeless Tobacco Never  Tobacco Comments   Does not qualify due to age.     Diagnosis:  Shortness of breath  Post covid-19 condition, unspecified  ADL UCSD:  Pulmonary Assessment Scores     Row Name 12/10/22 1001 03/08/23 1549       ADL UCSD   ADL Phase Entry Exit    SOB Score total 51 22      CAT Score   CAT Score 25 22      mMRC Score   mMRC Score 3 3             Initial Exercise Prescription:  Initial Exercise Prescription - 12/10/22 1200       Date of Initial Exercise RX and Referring Provider   Date 12/10/22    Referring Provider [name]    Expected Discharge Date 03/10/23      Oxygen   Oxygen Continuous    Liters 2    Maintain Oxygen Saturation 88% or higher      NuStep   Level 2    SPM 60    Minutes 15    METs 2      Track   Laps 12    Minutes 15    METs 1.5      Prescription Details   Frequency (times per week) 2    Duration Progress to 30 minutes of continuous aerobic without signs/symptoms of physical distress      Intensity   THRR 40-80% of Max Heartrate 54-109    Ratings of Perceived Exertion 11-13    Perceived Dyspnea 0-4      Progression   Progression Continue to progress workloads to maintain intensity without signs/symptoms of physical distress.      Resistance Training    Training Prescription Yes    Weight blue bands    Reps 10-15             Discharge Exercise Prescription (Final Exercise Prescription Changes):  Exercise Prescription Changes - 03/10/23 1500       Home Exercise Plan   Plans to continue exercise at Home (comment)    Frequency Add 3 additional days to program exercise sessions.    Initial Home Exercises Provided 03/10/23             Functional Capacity:  6 Minute Walk     Row Name 12/10/22 1101 03/10/23 1204       6 Minute Walk   Phase Initial Discharge    Distance 920 feet 1560 feet    Distance % Change -- 69.57 %    Distance Feet Change -- 640 ft    Walk Time 6 minutes 6 minutes    # of Rest Breaks 1 0    MPH 1.74 2.95    METS 1.19 2.35  RPE 11 13    Perceived Dyspnea  1 3    VO2 Peak 4.16 8.22    Symptoms -- No    Resting HR 89 bpm 74 bpm    Resting BP 132/62 120/60    Resting Oxygen Saturation  89 % 96 %    Exercise Oxygen Saturation  during 6 min walk 86 % 89 %    Max Ex. HR 100 bpm 98 bpm    Max Ex. BP 160/80 172/60    2 Minute Post BP 140/80 156/68      Interval HR   1 Minute HR 76 83    2 Minute HR 85 86    3 Minute HR 93 87    4 Minute HR 95 98    5 Minute HR 100 95    6 Minute HR 97 95    2 Minute Post HR 77 80    Interval Heart Rate? Yes Yes      Interval Oxygen   Interval Oxygen? Yes Yes    Baseline Oxygen Saturation % 93 % 96 %    1 Minute Oxygen Saturation % 88 % 95 %    1 Minute Liters of Oxygen 0 L 2 L    2 Minute Oxygen Saturation % 86 %  applied 2L 89 %    2 Minute Liters of Oxygen 2 L 2 L    3 Minute Oxygen Saturation % 88 % 90 %    3 Minute Liters of Oxygen 2 L 2 L    4 Minute Oxygen Saturation % 87 % 89 %    4 Minute Liters of Oxygen 2 L 2 L    5 Minute Oxygen Saturation % 90 % 89 %    5 Minute Liters of Oxygen 2 L 2 L    6 Minute Oxygen Saturation % 91 % 89 %    6 Minute Liters of Oxygen 2 L 2 L    2 Minute Post Oxygen Saturation % 96 % 97 %    2 Minute Post Liters  of Oxygen 2 L 2 L             Psychological, QOL, Others - Outcomes: PHQ 2/9:    03/08/2023    3:48 PM 12/10/2022   10:02 AM 09/07/2022    2:21 PM 06/22/2022   12:25 PM 03/26/2021   12:04 PM  Depression screen PHQ 2/9  Decreased Interest 0 0 0 0 0  Down, Depressed, Hopeless 0 0 0 0 0  PHQ - 2 Score 0 0 0 0 0  Altered sleeping 0 0  3   Tired, decreased energy 1 1  3    Change in appetite 0 1  3   Feeling bad or failure about yourself  0 0  0   Trouble concentrating 0 0  0   Moving slowly or fidgety/restless 0 0  0   Suicidal thoughts 0 0  0   PHQ-9 Score 1 2  9    Difficult doing work/chores Somewhat difficult Not difficult at all  Somewhat difficult     Quality of Life:   Personal Goals: Goals established at orientation with interventions provided to work toward goal.  Personal Goals and Risk Factors at Admission - 12/10/22 0935       Core Components/Risk Factors/Patient Goals on Admission    Weight Management Yes;Weight Loss    Intervention Weight Management: Develop a combined nutrition and exercise program designed to reach desired caloric intake,  while maintaining appropriate intake of nutrient and fiber, sodium and fats, and appropriate energy expenditure required for the weight goal.;Weight Management: Provide education and appropriate resources to help participant work on and attain dietary goals.;Weight Management/Obesity: Establish reasonable short term and long term weight goals.;Obesity: Provide education and appropriate resources to help participant work on and attain dietary goals.    Expected Outcomes Short Term: Continue to assess and modify interventions until short term weight is achieved;Long Term: Adherence to nutrition and physical activity/exercise program aimed toward attainment of established weight goal;Weight Maintenance: Understanding of the daily nutrition guidelines, which includes 25-35% calories from fat, 7% or less cal from saturated fats, less than  200mg  cholesterol, less than 1.5gm of sodium, & 5 or more servings of fruits and vegetables daily;Weight Loss: Understanding of general recommendations for a balanced deficit meal plan, which promotes 1-2 lb weight loss per week and includes a negative energy balance of [phone] kcal/d;Understanding recommendations for meals to include 15-35% energy as protein, 25-35% energy from fat, 35-60% energy from carbohydrates, less than 200mg  of dietary cholesterol, 20-35 gm of total fiber daily;Understanding of distribution of calorie intake throughout the day with the consumption of 4-5 meals/snacks    Improve shortness of breath with ADL's Yes    Intervention Provide education, individualized exercise plan and daily activity instruction to help decrease symptoms of SOB with activities of daily living.    Expected Outcomes Short Term: Improve cardiorespiratory fitness to achieve a reduction of symptoms when performing ADLs;Long Term: Be able to perform more ADLs without symptoms or delay the onset of symptoms    Increase knowledge of respiratory medications and ability to use respiratory devices properly  Yes    Expected Outcomes Long Term: Maintain appropriate use of medications, inhalers, and oxygen therapy.;Short Term: Achieves understanding of medications use. Understands that oxygen is a medication prescribed by physician. Demonstrates appropriate use of inhaler and oxygen therapy.              Personal Goals Discharge:  Goals and Risk Factor Review     Row Name 12/24/22 1559 01/17/23 1554 02/16/23 1546 03/11/23 0826       Core Components/Risk Factors/Patient Goals Review   Personal Goals Review Weight Management/Obesity;Improve shortness of breath with ADL's;Develop more efficient breathing techniques such as purse lipped breathing and diaphragmatic breathing and practicing self-pacing with activity.;Increase knowledge of respiratory medications and ability to use respiratory devices properly.  Weight Management/Obesity;Improve shortness of breath with ADL's;Develop more efficient breathing techniques such as purse lipped breathing and diaphragmatic breathing and practicing self-pacing with activity. Weight Management/Obesity;Improve shortness of breath with ADL's;Develop more efficient breathing techniques such as purse lipped breathing and diaphragmatic breathing and practicing self-pacing with activity. Weight Management/Obesity    Review Goal progressing for weight loss. Abdula has been working with our dietician for weight loss goals. Goal progressing on improving her shortness of breath with ADLs. Goal progressing on developing more efficient breathing techniques such as purse lipped breathing and diaphragmatic breathing; and practicing self-pacing with activity.He has to be reminded to use purse lip breathing when he gets short of breath.  Goal progressing for increasing knowledge of respiratory medications and ability to use respiratory devices properly. We will continue to monitor Omarian's progress throughout the program. Goal progressing for weight loss. Romulo has been working with our dietician for weight loss goals. Goal progressing on improving her shortness of breath with ADLs. Goal progressing on developing more efficient breathing techniques such as purse lipped breathing and diaphragmatic breathing;  and practicing self-pacing with activity.He has to be reminded to use purse lip breathing when he gets short of breath. Goal met for increasing knowledge of respiratory medications and ability to use respiratory devices properly. We will continue to monitor Karthikeya's progress throughout the program. Goal not met for weight loss. Deuce has been working with our dietician for weight loss goals but is up from his starting weight. He is still trying to cut calories and increase fruits and vegetables. Goal progressing on improving her shortness of breath with ADLs. Adams's oxygen level has  maintained >88% on room air while his METs and workload have increased. He states he can do "more things and not get so winded". Goal met on developing more efficient breathing techniques such as purse lipped breathing and diaphragmatic breathing; and practicing self-pacing with activity. Ehren can initiate the PLB on his own while exercising. He also knows how to self-pace and take breaks while walking the track. We will continue to monitor Shep's progress throughout the program. Guerino graduated from the program on 03/11/23. Goal for weight loss was not met. Goal was met for improving shortness of breath with ADL's. His pre SOB score was 51/post was 22. Jesus was a pleasure to have in class and we wish the best for him.    Expected Outcomes See admission goals See admission goals For Constant to lose weight and improve his SOB of ADLs To continue to exercise and modify his nutrition and lifestyle post graduation             Exercise Goals and Review:  Exercise Goals     Row Name 12/10/22 0940 12/17/22 1104 01/19/23 1122         Exercise Goals   Increase Physical Activity Yes Yes Yes     Intervention Provide advice, education, support and counseling about physical activity/exercise needs.;Develop an individualized exercise prescription for aerobic and resistive training based on initial evaluation findings, risk stratification, comorbidities and participant's personal goals. Provide advice, education, support and counseling about physical activity/exercise needs.;Develop an individualized exercise prescription for aerobic and resistive training based on initial evaluation findings, risk stratification, comorbidities and participant's personal goals. Provide advice, education, support and counseling about physical activity/exercise needs.;Develop an individualized exercise prescription for aerobic and resistive training based on initial evaluation findings, risk stratification, comorbidities  and participant's personal goals.     Expected Outcomes Short Term: Attend rehab on a regular basis to increase amount of physical activity.;Long Term: Add in home exercise to make exercise part of routine and to increase amount of physical activity. Short Term: Attend rehab on a regular basis to increase amount of physical activity.;Long Term: Add in home exercise to make exercise part of routine and to increase amount of physical activity. Short Term: Attend rehab on a regular basis to increase amount of physical activity.;Long Term: Add in home exercise to make exercise part of routine and to increase amount of physical activity.;Long Term: Exercising regularly at least 3-5 days a week.     Increase Strength and Stamina Yes Yes Yes     Intervention Provide advice, education, support and counseling about physical activity/exercise needs.;Develop an individualized exercise prescription for aerobic and resistive training based on initial evaluation findings, risk stratification, comorbidities and participant's personal goals. Provide advice, education, support and counseling about physical activity/exercise needs.;Develop an individualized exercise prescription for aerobic and resistive training based on initial evaluation findings, risk stratification, comorbidities and participant's personal goals. Provide advice, education, support and counseling about physical  activity/exercise needs.;Develop an individualized exercise prescription for aerobic and resistive training based on initial evaluation findings, risk stratification, comorbidities and participant's personal goals.     Expected Outcomes Short Term: Perform resistance training exercises routinely during rehab and add in resistance training at home;Short Term: Increase workloads from initial exercise prescription for resistance, speed, and METs.;Long Term: Improve cardiorespiratory fitness, muscular endurance and strength as measured by increased METs  and functional capacity ( ) Short Term: Perform resistance training exercises routinely during rehab and add in resistance training at home;Short Term: Increase workloads from initial exercise prescription for resistance, speed, and METs.;Long Term: Improve cardiorespiratory fitness, muscular endurance and strength as measured by increased METs and functional capacity ( ) Short Term: Perform resistance training exercises routinely during rehab and add in resistance training at home;Short Term: Increase workloads from initial exercise prescription for resistance, speed, and METs.;Long Term: Improve cardiorespiratory fitness, muscular endurance and strength as measured by increased METs and functional capacity ( )     Able to understand and use rate of perceived exertion (RPE) scale Yes Yes Yes     Intervention Provide education and explanation on how to use RPE scale Provide education and explanation on how to use RPE scale Provide education and explanation on how to use RPE scale     Expected Outcomes Short Term: Able to use RPE daily in rehab to express subjective intensity level;Long Term:  Able to use RPE to guide intensity level when exercising independently Short Term: Able to use RPE daily in rehab to express subjective intensity level;Long Term:  Able to use RPE to guide intensity level when exercising independently Short Term: Able to use RPE daily in rehab to express subjective intensity level;Long Term:  Able to use RPE to guide intensity level when exercising independently     Able to understand and use Dyspnea scale Yes Yes Yes     Intervention Provide education and explanation on how to use Dyspnea scale Provide education and explanation on how to use Dyspnea scale Provide education and explanation on how to use Dyspnea scale     Expected Outcomes Short Term: Able to use Dyspnea scale daily in rehab to express subjective sense of shortness of breath during exertion;Long Term: Able to use  Dyspnea scale to guide intensity level when exercising independently Short Term: Able to use Dyspnea scale daily in rehab to express subjective sense of shortness of breath during exertion;Long Term: Able to use Dyspnea scale to guide intensity level when exercising independently Short Term: Able to use Dyspnea scale daily in rehab to express subjective sense of shortness of breath during exertion;Long Term: Able to use Dyspnea scale to guide intensity level when exercising independently     Knowledge and understanding of Target Heart Rate Range (THRR) Yes Yes Yes     Intervention Provide education and explanation of THRR including how the numbers were predicted and where they are located for reference Provide education and explanation of THRR including how the numbers were predicted and where they are located for reference Provide education and explanation of THRR including how the numbers were predicted and where they are located for reference     Expected Outcomes Short Term: Able to state/look up THRR;Long Term: Able to use THRR to govern intensity when exercising independently;Short Term: Able to use daily as guideline for intensity in rehab Short Term: Able to state/look up THRR;Long Term: Able to use THRR to govern intensity when exercising independently;Short Term: Able to use daily as guideline for intensity  in rehab Short Term: Able to state/look up THRR;Long Term: Able to use THRR to govern intensity when exercising independently;Short Term: Able to use daily as guideline for intensity in rehab     Understanding of Exercise Prescription Yes Yes Yes     Intervention Provide education, explanation, and written materials on patient's individual exercise prescription Provide education, explanation, and written materials on patient's individual exercise prescription Provide education, explanation, and written materials on patient's individual exercise prescription     Expected Outcomes Short Term: Able  to explain program exercise prescription;Long Term: Able to explain home exercise prescription to exercise independently Short Term: Able to explain program exercise prescription;Long Term: Able to explain home exercise prescription to exercise independently Short Term: Able to explain program exercise prescription;Long Term: Able to explain home exercise prescription to exercise independently              Exercise Goals Re-Evaluation:  Exercise Goals Re-Evaluation     Row Name 12/17/22 1104 01/19/23 1122 02/17/23 0844 03/10/23 1553       Exercise Goal Re-Evaluation   Exercise Goals Review Increase Physical Activity;Able to understand and use Dyspnea scale;Understanding of Exercise Prescription;Increase Strength and Stamina;Knowledge and understanding of Target Heart Rate Range (THRR);Able to understand and use rate of perceived exertion (RPE) scale Increase Physical Activity;Able to understand and use Dyspnea scale;Understanding of Exercise Prescription;Increase Strength and Stamina;Knowledge and understanding of Target Heart Rate Range (THRR);Able to understand and use rate of perceived exertion (RPE) scale Increase Physical Activity;Able to understand and use Dyspnea scale;Understanding of Exercise Prescription;Increase Strength and Stamina;Knowledge and understanding of Target Heart Rate Range (THRR);Able to understand and use rate of perceived exertion (RPE) scale Increase Physical Activity;Able to understand and use Dyspnea scale;Understanding of Exercise Prescription;Increase Strength and Stamina;Knowledge and understanding of Target Heart Rate Range (THRR);Able to understand and use rate of perceived exertion (RPE) scale    Comments Kingdon has completed 1 exercise session. He exercises for 15 min on the Nustep and track. He averages 1.9 METs at level 1 on the Nustep and 2.08 METs on the track. He performs the warmup and cooldown standing/seated dependent on his orthopedic limitations.  Jyron need verbal cues and alternative exercises when doing the cooldown. It is too soon to note any discernable progressions. Will continue to monitor and progress as able. Dirck has completed 10 exercise sessions. He exercises for 15 min on the Nustep and track. He averages 2.6 METs at level 3 on the Nustep and 2.54 METs on the track. He performs the warmup and cooldown standing/seated dependent on his orthopedic limitations. Tarel understands the warmup and cooldown without verbal cues now. He has increased his workload for both exercise modes. His level on the Nustep has increased from 1-3 and laps on the track have increased from 8 to 11. Wilberth tolerates progressions well. Will continue to monitor and progress as able. Hiatt has completed 18 exercise sessions. He exercises for 15 min on the Nustep and track. He averages 2.5 METs at level 5 on the Nustep and 3.0 METs on the track. He performs the warmup and cooldown standing holding onto a chair for balance. He has also increased his band resistance for the cooldown. Hiawatha has progressed to performing most, if not all of the warmup and cooldown standing. He has increased his workload on the Nustep as METs have remained relatively the same. His track laps have also increased. Will discuss home exercise. Will continue to monitor and progress as able. Caydyn has completed 24  exercise sessions. His peak METs were 2.8 on the Nustep and 3.0 on the track. Davonte has enjoyed the program and feels he has gained some benefits. He hopes to start walking and riding his stationary bike at home. Advised him not to use his treadmill to due risk of falling.    Expected Outcomes Through exercise at rehab and home, the patient will decrease shortness of breath with daily activities and feel confident in carrying out an exercise regimen at home. Through exercise at rehab and home, the patient will decrease shortness of breath with daily activities and feel confident  in carrying out an exercise regimen at home. Through exercise at rehab and home, the patient will decrease shortness of breath with daily activities and feel confident in carrying out an exercise regimen at home. Through exercise at rehab and home, the patient will decrease shortness of breath with daily activities and feel confident in carrying out an exercise regimen at home.             Nutrition & Weight - Outcomes:  Pre Biometrics - 12/10/22 1057       Pre Biometrics   Grip Strength 46 kg              Nutrition:  Nutrition Therapy & Goals - 02/15/23 1127       Nutrition Therapy   Diet Heart Healthy Diet      Personal Nutrition Goals   Nutrition Goal Patient to improve diet quality by using the plate method as a guide for meal planning to include lean protein/plant protein, fruits, vegetables, whole grains, nonfat dairy as part of a well-balanced diet.   in progress.   Personal Goal #2 Patient to identify strategies for weight loss of 0.5-2.0# per week.   goal not met.   Comments Goals in progress. Chantry enjoys a wide variety of foods including fruits and vegetables. While he does cook often, he does buy many convenience foods such as frozen chicken pot pie, sweets, etc. He is motivated to lose weight. We discussed and reviewed multiple strategies for weight losss including decreasing calories from fat and carbohydrates, increasing non-starchy vegetable intake, elminating sugary beverages, reducing portion sizes, decreasing night time snacking behaviors, etc. He has not met weight loss goals; he is up 1.5# since starting with our program. Patient will benefit from participation in pulmonary rehab for nutrition, exercise, and lifestyle modification.      Intervention Plan   Intervention Prescribe, educate and counsel regarding individualized specific dietary modifications aiming towards targeted core components such as weight, hypertension, lipid management, diabetes, heart  failure and other comorbidities.;Nutrition handout(s) given to patient.    Expected Outcomes Short Term Goal: Understand basic principles of dietary content, such as calories, fat, sodium, cholesterol and nutrients.;Long Term Goal: Adherence to prescribed nutrition plan.             Nutrition Discharge:  Nutrition Assessments - 03/08/23 1544       Rate Your Plate Scores   Post Score 50             Education Questionnaire Score:  Knowledge Questionnaire Score - 03/08/23 1548       Knowledge Questionnaire Score   Pre Score 15/18    Post Score 17/18             Goals reviewed with patient; copy given to patient.

## 2023-03-14 ENCOUNTER — Other Ambulatory Visit: Payer: Self-pay | Admitting: Family Medicine

## 2023-03-14 ENCOUNTER — Telehealth: Payer: Self-pay

## 2023-03-14 NOTE — Telephone Encounter (Signed)
Pt called in wanting to speak to the nurse/pcp about a few things. Pt would like to know if a written prescription could be sent to the Patient Assistance program for Xarelto. Pt would also like to speak with nurse/pcp to discuss his options for having lung surgery that was mentioned to him before by pcp. Pt stated that he has completed the exercises with the pulmonologist and this has helped his body get more tuned up. Pt stated that he understands that the exercises were to help his body get adjusted to what going on with him, but would now like to discuss his options of surgery. Pt is willing to come in and speak with pcp at an appt if needed. Please advise.  Cb#: [phone]

## 2023-03-15 ENCOUNTER — Encounter (HOSPITAL_COMMUNITY): Payer: Medicare Other

## 2023-03-15 NOTE — Telephone Encounter (Signed)
Requested medication (s) are due for refill today: yes  Requested medication (s) are on the active medication list: yes  Last refill:  06/24/22  Future visit scheduled:no  Notes to clinic:  routing for clarification on sig.     Requested Prescriptions  Pending Prescriptions Disp Refills   gabapentin (NEURONTIN) 300 MG capsule [Pharmacy Med Name: Gabapentin 300 MG Oral Capsule] 270 capsule 0    Sig: TAKE 1 CAPSULE BY MOUTH THREE TIMES DAILY AS NEEDED FOR  SCIATICA     Neurology: Anticonvulsants - gabapentin Failed - 03/14/2023 11:44 AM      Failed - Valid encounter within last 12 months    Recent Outpatient Visits           1 year ago Chronic obstructive pulmonary disease, unspecified COPD type (HCC)   [facility] Pickard, Priscille Heidelberg, MD   1 year ago Left sided sciatica   [facility] Pickard, Priscille Heidelberg, MD   1 year ago Vertigo   [facility] Donita Brooks, MD   2 years ago S/P total left hip arthroplasty   [facility] Donita Brooks, MD   2 years ago Chronic obstructive pulmonary disease, unspecified COPD type (HCC)   [facility] Pickard, Priscille Heidelberg, MD       Future Appointments             In 6 days Pickard, Priscille Heidelberg, MD [facility]   In 1 month [place], [place], [place] [facility] - Cr in normal range and within 360 days    Creat  Date Value Ref Range Status  06/22/2022 1.36 (H) 0.70 - 1.22 mg/dL Final   Creatinine, Ser  Date Value Ref Range Status  08/30/2022 1.22 0.61 - 1.24 mg/dL Final         Passed - Completed PHQ-2 or PHQ-9 in the last 360 days

## 2023-03-16 ENCOUNTER — Telehealth: Payer: Self-pay | Admitting: Family Medicine

## 2023-03-16 NOTE — Telephone Encounter (Signed)
Prescription Request  03/16/2023  LOV: 09/14/2022  What is the name of the medication or equipment?   gabapentin (NEURONTIN) 300 MG capsule  **90 day supply requested**  Have you contacted your pharmacy to request a refill? Yes   Which pharmacy would you like this sent to?  [id number] - [place] ([place]), [place] - [address] [place] [address] Phone: [phone] Fax: [phone]    Patient notified that their request is being sent to the clinical staff for review and that they should receive a response within 2 business days.   Please advise patient when refill sent at [phone]

## 2023-03-17 ENCOUNTER — Other Ambulatory Visit: Payer: Self-pay | Admitting: Family Medicine

## 2023-03-17 ENCOUNTER — Other Ambulatory Visit: Payer: Self-pay

## 2023-03-17 DIAGNOSIS — M5432 Sciatica, left side: Secondary | ICD-10-CM

## 2023-03-17 MED ORDER — GABAPENTIN 300 MG PO CAPS: 300.0000 mg | ORAL_CAPSULE | Freq: Three times a day (TID) | ORAL | 1 refills | Status: DC

## 2023-03-17 MED ORDER — GABAPENTIN 300 MG PO CAPS
300.0000 mg | ORAL_CAPSULE | Freq: Three times a day (TID) | ORAL | 1 refills | Status: DC
Start: 2023-03-17 — End: 2023-09-16

## 2023-03-21 ENCOUNTER — Ambulatory Visit (INDEPENDENT_AMBULATORY_CARE_PROVIDER_SITE_OTHER): Payer: Medicare Other | Admitting: Family Medicine

## 2023-03-21 ENCOUNTER — Encounter: Payer: Self-pay | Admitting: Family Medicine

## 2023-03-21 VITALS — BP 122/80 | HR 64 | Ht 71.0 in | Wt 250.0 lb

## 2023-03-21 DIAGNOSIS — J449 Chronic obstructive pulmonary disease, unspecified: Secondary | ICD-10-CM

## 2023-03-21 NOTE — Progress Notes (Signed)
Subjective:    Patient ID: Brendan Hopkins, male    DOB: [birth year], 87 y.o.   MRN: [id number] Patient is an 87 year old Caucasian gentleman who has a history of severe COPD.  He wears oxygen at night to help with sleep and with activity.  He has a history of recurrent pneumonia.  He also has a history of atrial fibrillation and DVT.  He is currently on Trelegy.  He is here today requesting a surgical consult.  Apparently he was recently spoke with his pulmonologist.  He was told that his right lung was severely scarred due to COPD.  He is now requesting to see a surgeon to "remove his right lung so that it will not spread to his left lung".  He also seems upset because he was told that he has less than 6 months to live and to get his affairs in order. Past Medical History:  Diagnosis Date   A-fib [facility])    Acute deep vein thrombosis (DVT) of tibial vein of right lower extremity (HCC)    after ankle fracture/surgery (2/21)   Arthritis    RIGHT HIP, HANDS, BACK   Cancer (HCC)    Skin cancer    Cataract    Cervical radiculopathy    severe C3-4 neuroforaminal stenosis, C5-6 left synovial cyst compressing left nerve root.   Colon polyps    2005   COPD (chronic obstructive pulmonary disease) (HCC)    SMOKED FOR 64 YRS   Diverticulosis    2005   Fatty liver    GERD (gastroesophageal reflux disease)    Hard of hearing    History of kidney stones    Hypertension    Leg fracture, right    Pain    RIGHT HIP AND BACK   Personal history of colonic polyps-adenoma and sessile serrated polyp 07/15/2010   Pneumonia 03/2019   RBBB    POSS HX AF IN PAST- PAC'S   Shortness of breath    WITH EXERTION ONLY   Stroke (HCC)    TIA   TIA (transient ischemic attack)    Wears glasses    Past Surgical History:  Procedure Laterality Date   COLONOSCOPY     maybe 3    COLONOSCOPY W/ POLYPECTOMY     CYSTOSCOPY WITH RETROGRADE PYELOGRAM, URETEROSCOPY AND STENT PLACEMENT Left 12/11/2019    Procedure: CYSTOSCOPY WITH LEFT RETROGRADE LEFT  URETEROSCOPY WITH HOLMIUM LASER AND STENT PLACEMENT;  Surgeon: Bjorn Pippin, MD;  Location: [facility];  Service: Urology;  Laterality: Left;   ORIF ANKLE FRACTURE Right 07/11/2019   ORIF ANKLE FRACTURE Right 07/11/2019   Procedure: OPEN REDUCTION INTERNAL FIXATION (ORIF) ANKLE FRACTURE;  Surgeon: Roby Lofts, MD;  Location: [facility];  Service: Orthopedics;  Laterality: Right;   skin cancers removed      TOTAL HIP ARTHROPLASTY Right 01/09/2013   Procedure: RIGHT TOTAL HIP ARTHROPLASTY ANTERIOR APPROACH;  Surgeon: Shelda Pal, MD;  Location: [facility];  Service: Orthopedics;  Laterality: Right;   TOTAL HIP ARTHROPLASTY Left 02/24/2021   Procedure: TOTAL HIP ARTHROPLASTY ANTERIOR APPROACH;  Surgeon: Durene Romans, MD;  Location: [facility];  Service: Orthopedics;  Laterality: Left;       No Known Allergies Social History   Socioeconomic History   Marital status: Widowed    Spouse name: Not on file   Number of children: 3   Years of education: 58   Highest education level: Not on file  Occupational History   Occupation: Retired  Tobacco Use   Smoking status: Former    Current packs/day: 0.00    Average packs/day: 2.0 packs/day for 57.0 years (114.0 ttl pk-yrs)    Types: Cigarettes    Start date: 05/24/1950    Quit date: 05/25/2007    Years since quitting: 15.8   Smokeless tobacco: Never   Tobacco comments:    Does not qualify due to age.   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   Social Determinants of Health   Financial Resource Strain: Low Risk  (09/01/2022)   Overall Financial Resource Strain (CARDIA)    Difficulty of Paying Living Expenses: Not very hard  Food  Insecurity: No Food Insecurity (08/28/2022)   Hunger Vital Sign    Worried About Running Out of Food in the Last Year: Never true    Ran Out of Food in the Last Year: Never true  Transportation Needs: No Transportation Needs (09/01/2022)   PRAPARE - Administrator, Civil Service (Medical): No    Lack of Transportation (Non-Medical): No  Physical Activity: Inactive (03/26/2022)   Exercise Vital Sign    Days of Exercise per Week: 0 days    Minutes of Exercise per Session: 0 min  Stress: No Stress Concern Present (03/26/2021)   [employer] of Occupational Health - Occupational Stress Questionnaire    Feeling of Stress : Not at all  Social Connections: Socially Isolated (03/26/2022)   Social Connection and Isolation Panel [NHANES]    Frequency of Communication with Friends and Family: Never    Frequency of Social Gatherings with Friends and Family: Never    Attends Religious Services: 1 to 4 times per year    Active Member of [employer] or Organizations: No    Attends Banker Meetings: Never    Marital Status: Widowed  Intimate Partner Violence: Not At Risk (08/28/2022)   Humiliation, Afraid, Rape, and Kick questionnaire    Fear of Current or Ex-Partner: No    Emotionally Abused: No    Physically Abused: No    Sexually Abused: No    Review of Systems     Objective:   Physical Exam Vitals reviewed.  Constitutional:      General: He is not in acute distress.    Appearance: Normal appearance. He is normal weight. He is not ill-appearing or toxic-appearing.  Eyes:     General: No visual field deficit. Cardiovascular:     Rate and Rhythm: Normal rate. Rhythm irregularly irregular.     Heart sounds: Normal heart sounds.  Pulmonary:     Effort: Pulmonary effort is normal. No respiratory distress.     Breath sounds: Decreased air movement present. No stridor. Decreased breath sounds and rales present. No wheezing or rhonchi.  Chest:     Chest wall: No tenderness.   Musculoskeletal:     Right lower leg: No edema.     Left lower leg: No edema.  Skin:    Findings: No erythema or rash.  Neurological:     Mental Status: He is alert and oriented to person, place, and time.     Cranial Nerves: Cranial nerves 2-12 are intact. No cranial  nerve deficit, dysarthria or facial asymmetry.     Sensory: Sensation is intact.     Motor: No weakness.          Assessment & Plan:  Chronic obstructive pulmonary disease, unspecified COPD type (HCC) I spent approximately 25 minutes with the patient discussing his concerns.  First I explained to him that COPD is not contagious.  Damage in the right lung will not "spread" to the left lung.  I explained to him that surgery to remove damaged lung tissue will not help his lung function and would likely only hasten his death.  Also explained to the patient that he does have severe COPD but overall I think he is doing well.  I believe that any life expectancy issues are more a function of his age than the severity of his COPD.  I explained to the patient the best thing he can do is avoid smoking, be consistent with his Trelegy, and avoid sick individuals.  He also needs to keep his immunizations up-to-date.

## 2023-03-23 ENCOUNTER — Ambulatory Visit (HOSPITAL_COMMUNITY)
Admission: EM | Admit: 2023-03-23 | Discharge: 2023-03-23 | Disposition: A | Discharge status: Home / Self Care | Payer: Medicare Other | Attending: Family Medicine | Admitting: Family Medicine

## 2023-03-23 ENCOUNTER — Encounter (HOSPITAL_COMMUNITY): Payer: Self-pay | Admitting: Emergency Medicine

## 2023-03-23 ENCOUNTER — Other Ambulatory Visit: Payer: Self-pay

## 2023-03-23 DIAGNOSIS — M25422 Effusion, left elbow: Secondary | ICD-10-CM | POA: Diagnosis not present

## 2023-03-23 NOTE — Discharge Instructions (Addendum)
Keep pressure to the wound for the next 1-2 days. Watch for signs of infection such as redness, swelling, pus, or pain. Follow-up with [facility] as needed.  If you develop any new or worsening symptoms or if your symptoms do not start to improve, please return here or follow-up with your primary care provider. If your symptoms are severe, please go to the emergency room.
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
## 2023-03-23 NOTE — ED Triage Notes (Addendum)
Left elbow pain for 2 weeks.  There is a soft knot at elbow that patient reports has been there 2 months. This area is not painful, unless direct pressure applied Patient has not taken any medications.  Has tried wrapping with pressure applied, no improvement.

## 2023-03-24 ENCOUNTER — Ambulatory Visit (HOSPITAL_COMMUNITY): Payer: Medicare Other | Attending: Physician Assistant

## 2023-03-24 DIAGNOSIS — I480 Paroxysmal atrial fibrillation: Secondary | ICD-10-CM | POA: Diagnosis not present

## 2023-03-24 LAB — ECHOCARDIOGRAM COMPLETE
AR max vel: 1.66 cm2
AV Area VTI: 1.74 cm2
AV Area mean vel: 1.84 cm2
AV Mean grad: 12.8 mm[Hg]
AV Peak grad: 26.4 mm[Hg]
Ao pk vel: 2.57 m/s
Area-P 1/2: 2.25 cm2
P 1/2 time: 458 ms
S' Lateral: 2.8 cm

## 2023-03-25 ENCOUNTER — Telehealth: Payer: Self-pay

## 2023-03-25 NOTE — Telephone Encounter (Addendum)
Called patient regarding results, pt wants to know if there was anything he could do to prevent going in to afib and if there wasn't anything he could do to prevent it what should he do in the moment of experiencing an episode of afib, told pt I would reach out to provider about any suggestions and give him a call back  ----- Message from Azalee Course sent at 03/24/2023  6:59 PM EDT ----- Normal pumping function of heart. Severe thickening of left heart wall. Mild aortic valve stenosis and mild aortic leakage. Left atrial size is mild to moderately dilated, suggest there is higher likelihood to recurrent afib.

## 2023-03-29 NOTE — Telephone Encounter (Signed)
The metoprolol succinate is the primary rate control medication that would decrease heart rate, therefore reduce the chance of afib. As far as if there is anything Brendan Hopkins can do to present going into afib, we recommend refrain from caffeine or alcohol. Other than that, there is not much patient can do to reduce recurrence of afib. Unfortunately, afib has a tendency to recur with age and any stress on the body. If he does have recurrence of afib, please let us know, there are other medications we can consider to help him control afib.

## 2023-03-29 NOTE — Telephone Encounter (Signed)
Called patient regarding provider advise on patient going into afib, patient had understanding

## 2023-03-30 ENCOUNTER — Other Ambulatory Visit: Payer: Self-pay | Admitting: Family Medicine

## 2023-04-04 ENCOUNTER — Other Ambulatory Visit: Payer: Self-pay | Admitting: Family Medicine

## 2023-04-04 DIAGNOSIS — K219 Gastro-esophageal reflux disease without esophagitis: Secondary | ICD-10-CM

## 2023-04-05 NOTE — Telephone Encounter (Signed)
LOV 03/21/2023  Requested Prescriptions  Pending Prescriptions Disp Refills   pantoprazole (PROTONIX) 40 MG tablet [Pharmacy Med Name: Pantoprazole Sodium 40mg  Delayed-Release Tablet] 30 tablet 11    Sig: Take 1 tablet by mouth every day     Gastroenterology: Proton Pump Inhibitors Failed - 04/04/2023  1:01 AM      Failed - Valid encounter within last 12 months    Recent Outpatient Visits           1 year ago Chronic obstructive pulmonary disease, unspecified COPD type (HCC)   [facility] Pickard, Priscille Heidelberg, MD   1 year ago Left sided sciatica   [facility] Donita Brooks, MD   1 year ago Vertigo   [facility] Donita Brooks, MD   2 years ago S/P total left hip arthroplasty   [facility] Donita Brooks, MD   2 years ago Chronic obstructive pulmonary disease, unspecified COPD type (HCC)   [facility] Pickard, Priscille Heidelberg, MD       Future Appointments             In 3 weeks Azalee Course, [place] [facility] at [facility]

## 2023-04-06 ENCOUNTER — Other Ambulatory Visit: Payer: Self-pay | Admitting: Family Medicine

## 2023-04-06 NOTE — Telephone Encounter (Signed)
Requested medication (s) are due for refill today: yes  Requested medication (s) are on the active medication list: yes  Last refill:  11/04/22 #30/5  Future visit scheduled: no  Notes to clinic:  Unable to refill per protocol, cannot delegate.      Requested Prescriptions  Pending Prescriptions Disp Refills   zolpidem (AMBIEN) 10 MG tablet [Pharmacy Med Name: Zolpidem Tartrate 10mg  Tablet] 30 tablet 5    Sig: Take 1 tablet by mouth at bedtime as needed for sleep     Not Delegated - Psychiatry:  Anxiolytics/Hypnotics Failed - 04/06/2023  1:01 AM      Failed - This refill cannot be delegated      Failed - Urine Drug Screen completed in last 360 days      Failed - Valid encounter within last 6 months    Recent Outpatient Visits           1 year ago Chronic obstructive pulmonary disease, unspecified COPD type (HCC)   [facility] Pickard, Priscille Heidelberg, MD   1 year ago Left sided sciatica   [facility] Donita Brooks, MD   1 year ago Vertigo   [facility] Donita Brooks, MD   2 years ago S/P total left hip arthroplasty   [facility] Donita Brooks, MD   2 years ago Chronic obstructive pulmonary disease, unspecified COPD type (HCC)   [facility] Pickard, Priscille Heidelberg, MD       Future Appointments             In 3 weeks Azalee Course, [place] [facility] at [facility]

## 2023-04-12 ENCOUNTER — Other Ambulatory Visit: Payer: Self-pay | Admitting: Family Medicine

## 2023-04-13 ENCOUNTER — Other Ambulatory Visit: Payer: Self-pay | Admitting: Family Medicine

## 2023-04-13 NOTE — Telephone Encounter (Signed)
Requested by interface surescripts. Last OV 03/21/23  Requested Prescriptions  Pending Prescriptions Disp Refills   metoprolol succinate (TOPROL-XL) 25 MG 24 hr tablet [Pharmacy Med Name: Metoprolol Succinate ER 25 MG Oral Tablet Extended Release 24 Hour] 90 tablet 0    Sig: Take 1 tablet by mouth once daily     Cardiovascular:  Beta Blockers Failed - 04/12/2023  8:44 AM      Failed - Valid encounter within last 6 months    Recent Outpatient Visits           1 year ago Chronic obstructive pulmonary disease, unspecified COPD type (HCC)   [facility] Pickard, Priscille Heidelberg, MD   1 year ago Left sided sciatica   [facility] Pickard, Priscille Heidelberg, MD   1 year ago Vertigo   [facility] Donita Brooks, MD   2 years ago S/P total left hip arthroplasty   [facility] Donita Brooks, MD   2 years ago Chronic obstructive pulmonary disease, unspecified COPD type (HCC)   [facility] Pickard, Priscille Heidelberg, MD       Future Appointments             In 2 weeks Azalee Course, [place] [facility] at [facility]   In 1 month Luciano Cutter, MD [facility] at [facility] [place]            Passed - Last BP in normal range    BP Readings from Last 1 Encounters:  03/23/23 132/76         Passed - Last Heart Rate in normal range    Pulse Readings from Last 1 Encounters:  03/23/23 71

## 2023-04-14 NOTE — Telephone Encounter (Signed)
Requested medications are due for refill today.  unsure  Requested medications are on the active medications list.  no  Last refill. 10/11/2019   Future visit scheduled.   no  Notes to clinic.  Refill not delegated.Not on med list.    Requested Prescriptions  Pending Prescriptions Disp Refills   meclizine (ANTIVERT) 25 MG tablet [Pharmacy Med Name: Meclizine HCl 25 MG Oral Tablet] 60 tablet 0    Sig: TAKE 1 TABLET BY MOUTH THREE TIMES DAILY AS NEEDED FOR DIZZINESS     Not Delegated - Gastroenterology: Antiemetics Failed - 04/13/2023  2:59 PM      Failed - This refill cannot be delegated      Failed - Valid encounter within last 6 months    Recent Outpatient Visits           1 year ago Chronic obstructive pulmonary disease, unspecified COPD type (HCC)   [facility] Pickard, Priscille Heidelberg, MD   1 year ago Left sided sciatica   [facility] Donita Brooks, MD   1 year ago Vertigo   [facility] Donita Brooks, MD   2 years ago S/P total left hip arthroplasty   [facility] Donita Brooks, MD   2 years ago Chronic obstructive pulmonary disease, unspecified COPD type (HCC)   [facility] Pickard, Priscille Heidelberg, MD       Future Appointments             In 1 week Azalee Course, [place] [facility] at [facility]   In 1 month [name], [place], MD [facility] at [facility], [place]

## 2023-04-15 ENCOUNTER — Ambulatory Visit (HOSPITAL_COMMUNITY): Payer: Self-pay

## 2023-04-15 ENCOUNTER — Ambulatory Visit: Payer: Self-pay | Admitting: Family Medicine

## 2023-04-15 NOTE — Telephone Encounter (Signed)
   Chief Complaint: abrasion Symptoms: cut to lower right leg, bleeding Frequency: comes and goes Pertinent Negatives: Patient denies fever, signs of infection Disposition: [] ED /[x] Urgent Care (no appt availability in office) / [] Appointment(In office/virtual)/ []  [place] [facility]/ [] Home Care/ [] Refused Recommended Disposition /[] [place] [facility]/ []  Follow-up with PCP Additional Notes: Patient called in reporting that 4 days ago he got a small scratch to his lower right leg from his dog. Patient reports he is on blood thinners so has been having a hard time controlling the bleeding. Patient reports it is not bleeding in large amounts but that the cut does not seem to fully be closing so he has been seeing small amounts of blood on the bandage since day of injury. Per protocol, this RN attempted to schedule in office visit. No available appts until December. This RN was able to schedule UC office visit for today 11/22. Patient verbalized understanding.  Copied from CRM [id number]. Topic: Clinical - Red Word Triage >> Apr 15, 2023 10:34 AM Cassiday T wrote: Red Word that prompted transfer to Nurse Triage: patient has a scratch on his right leg from the dog its been bleeding for four days he is not losing a lot of blood but the scratch want stop bleeding.pt is on blood thinners Reason for Disposition  No prior tetanus shots (or is not fully vaccinated)  Answer Assessment - Initial Assessment Questions 1. APPEARANCE of INJURY: "What does the injury look like?"      Red straight line 2. SIZE: "How large is the cut?"      About an inch and half long 3. BLEEDING: "Is it bleeding now?" If Yes, ask: "Is it difficult to stop?"      Not bleeding a lot but has been bleeding since injury 4. LOCATION: "Where is the injury located?"      Right leg down by ankle 5. ONSET: "How long ago did the injury occur?"      4 days ago 6. MECHANISM: "Tell me how it happened."      My dog scratched my  leg 7. TETANUS: "When was the last tetanus booster?"     unsure  Protocols used: Skin Injury-A-AH

## 2023-04-18 DIAGNOSIS — L821 Other seborrheic keratosis: Secondary | ICD-10-CM | POA: Diagnosis not present

## 2023-04-18 DIAGNOSIS — L814 Other melanin hyperpigmentation: Secondary | ICD-10-CM | POA: Diagnosis not present

## 2023-04-18 DIAGNOSIS — D225 Melanocytic nevi of trunk: Secondary | ICD-10-CM | POA: Diagnosis not present

## 2023-04-18 DIAGNOSIS — Z86007 Personal history of in-situ neoplasm of skin: Secondary | ICD-10-CM | POA: Diagnosis not present

## 2023-04-18 DIAGNOSIS — Z85828 Personal history of other malignant neoplasm of skin: Secondary | ICD-10-CM | POA: Diagnosis not present

## 2023-04-18 DIAGNOSIS — Z08 Encounter for follow-up examination after completed treatment for malignant neoplasm: Secondary | ICD-10-CM | POA: Diagnosis not present

## 2023-04-18 DIAGNOSIS — L570 Actinic keratosis: Secondary | ICD-10-CM | POA: Diagnosis not present

## 2023-04-19 NOTE — Telephone Encounter (Signed)
Copied from CRM [id number]. Topic: Clinical - Medication Refill >> Apr 19, 2023 12:24 PM Fonda Kinder J wrote: Most Recent Primary Care Visit:  Provider: Lynnea Ferrier T  Department[facility]  Visit Type: OFFICE VISIT  Date: 03/21/2023  Medication: Representative from [facility] called in to request provider's signature for prescription refill for Zolpidem [facility])  Has the patient contacted their pharmacy? Yes (Agent: If no, request that the patient contact the pharmacy for the refill. If patient does not wish to contact the pharmacy document the reason why and proceed with request.) (Agent: If yes, when and what did the pharmacy advise?)  Is this the correct pharmacy for this prescription? Yes If no, delete pharmacy and type the correct one.  This is the patient's preferred pharmacy:    [facility] [place] - [address] [place] [address] Phone: [phone] Fax: [phone]   Has the prescription been filled recently? No  Is the patient out of the medication? Yes  Has the patient been seen for an appointment in the last year OR does the patient have an upcoming appointment? Yes  Can we respond through MyChart? No  Agent: Please be advised that Rx refills may take up to 3 business days. We ask that you follow-up with your pharmacy.

## 2023-04-20 ENCOUNTER — Telehealth: Payer: Self-pay

## 2023-04-20 NOTE — Telephone Encounter (Signed)
Copied from CRM [id number]. Topic: Clinical - Medication Refill >> Apr 20, 2023 12:33 PM Amy B wrote: Most Recent Primary Care Visit:  Provider: Lynnea Ferrier T  Department: [facility]  Visit Type: OFFICE VISIT  Date: 03/21/2023  Medication: meclizine 25 mg  Has the patient contacted their pharmacy? Yes (Agent: If no, request that the patient contact the pharmacy for the refill. If patient does not wish to contact the pharmacy document the reason why and proceed with request.) (Agent: If yes, when and what did the pharmacy advise?)  Is this the correct pharmacy for this prescription? Yes If no, delete pharmacy and type the correct one.  This is the patient's preferred pharmacy:  [facility] - [place] - [address] [place] [address] Phone: [phone] Fax: [phone]  Has the prescription been filled recently? No  Is the patient out of the medication? Yes  Has the patient been seen for an appointment in the last year OR does the patient have an upcoming appointment? Yes  Can we respond through MyChart? No  Agent: Please be advised that Rx refills may take up to 3 business days. We ask that you follow-up with your pharmacy.

## 2023-04-20 NOTE — Telephone Encounter (Signed)
Message sent to PCP regarding meclizine.

## 2023-04-21 ENCOUNTER — Other Ambulatory Visit: Payer: Self-pay | Admitting: Family Medicine

## 2023-04-21 MED ORDER — MECLIZINE HCL 25 MG PO TABS: 25.0000 mg | ORAL_TABLET | Freq: Three times a day (TID) | ORAL | 0 refills | Status: DC | PRN

## 2023-04-26 ENCOUNTER — Telehealth: Payer: Self-pay

## 2023-04-26 ENCOUNTER — Other Ambulatory Visit: Payer: Self-pay | Admitting: Family Medicine

## 2023-04-26 MED ORDER — ZOLPIDEM TARTRATE 10 MG PO TABS: 10.0000 mg | ORAL_TABLET | Freq: Every evening | ORAL | 5 refills | Status: DC | PRN

## 2023-04-26 NOTE — Telephone Encounter (Signed)
Copied from CRM [id number]. Topic: Clinical - Medication Refill >> Apr 26, 2023  4:17 PM Dondra Prader A wrote: Most Recent Primary Care Visit:  Provider: Lynnea Ferrier T  Department: [facility]  Visit Type: OFFICE VISIT  Date: 03/21/2023  Medication: zolpidem (AMBIEN) 10 MG tablet  Has the patient contacted their pharmacy? Yes - Pharmacy told Pt to contact PCP (Agent: If no, request that the patient contact the pharmacy for the refill. If patient does not wish to contact the pharmacy document the reason why and proceed with request.) (Agent: If yes, when and what did the pharmacy advise?)  Is this the correct pharmacy for this prescription? Yes - [facility] - [place], [place] - [address] If no, delete pharmacy and type the correct one.  This is the patient's preferred pharmacy:  [facility] - [place] - [address] [place] [address] Phone: [phone] Fax: [phone]  [facility] [place], [place] - [address] [place] [address] Phone: [phone] Fax: [phone]   Has the prescription been filled recently? No  Is the patient out of the medication? No  Has the patient been seen for an appointment in the last year OR does the patient have an upcoming appointment? Yes  Can we respond through MyChart? No  Agent: Please be advised that Rx refills may take up to 3 business days. We ask that you follow-up with your pharmacy.

## 2023-04-27 ENCOUNTER — Encounter: Payer: Self-pay | Admitting: Physician Assistant

## 2023-04-27 ENCOUNTER — Ambulatory Visit: Payer: Medicare Other | Attending: Physician Assistant | Admitting: Physician Assistant

## 2023-04-27 VITALS — BP 138/70 | HR 70 | Wt 256.4 lb

## 2023-04-27 DIAGNOSIS — I480 Paroxysmal atrial fibrillation: Secondary | ICD-10-CM

## 2023-04-27 DIAGNOSIS — R600 Localized edema: Secondary | ICD-10-CM

## 2023-04-27 DIAGNOSIS — J449 Chronic obstructive pulmonary disease, unspecified: Secondary | ICD-10-CM | POA: Diagnosis not present

## 2023-04-27 NOTE — Patient Instructions (Signed)
Medication Instructions:  STOP ASPIRIN The current medical regimen is effective;  continue present plan and medications as directed. Please refer to the Current Medication list given to you today.  *If you need a refill on your cardiac medications before your next appointment, please call your pharmacy*  Lab Work: NONE  Testing/Procedures: NONE  Follow-Up: At [facility], you and your health needs are our priority.  As part of our continuing mission to provide you with exceptional heart care, we have created designated Provider Care Teams.  These Care Teams include your primary Cardiologist (physician) and Advanced Practice Providers (APPs -  Physician Assistants and Nurse Practitioners) who all work together to provide you with the care you need, when you need it.  We recommend signing up for the patient portal called "MyChart".  Sign up information is provided on this After Visit Summary.  MyChart is used to connect with patients for Virtual Visits (Telemedicine).  Patients are able to view lab/test results, encounter notes, upcoming appointments, etc.  Non-urgent messages can be sent to your provider as well.   To learn more about what you can do with MyChart, go to [email or web address].    Your next appointment:   4 month(s)  Provider:   Olga Millers, MD  or Azalee Course, PA-C        Other Instructions

## 2023-04-27 NOTE — Progress Notes (Signed)
Cardiology Office Note:  .   Date:  04/27/2023  ID:  Brendan Hopkins, DOB 1935/10/21, MRN [id number] PCP: Donita Brooks, MD  [facility] Providers Cardiologist:  Olga Millers, MD     History of Present Illness: .   Brendan Hopkins is a 87 y.o. male with past medical history of paroxysmal atrial fibrillation, chronic dyspnea, hypertension, TIA, DVT, COPD, former tobacco use, kidney stone and GERD. Echocardiogram in July 2022 showed normal EF, grade 1 DD, trace AI, mildly dilated aortic root. Lower extremity venous Doppler in October 2022 showed no evidence of DVT. He does have a prior history of DVT in postop setting. He has chronic dyspnea on exertion following COVID infection in 2021. He has chronic mild bilateral lower extremity edema. Myoview in March 2023 was low risk, no evidence of ischemia, EF 55 to 60%. He was diagnosed with A-fib by his PCP in August 2023 and was started on Xarelto and metoprolol. He has no cardiac awareness of A-fib. He was seen by Bernadene Person NP on 02/01/2022. EKG at the time showed that he was holding sinus rhythm with underlying right bundle branch block. It was offered for the patient to wear a 30-day heart monitor to assess the A-fib burden, however patient declined at the time. He was hospitalized in April 2024 with pneumonia and has been placed on home oxygen. Recently, he was at pulmonary rehab on 02/24/2023 and was noted to be in A-fib with RVR with heart rate of 140. EKG was personally reviewed which showed atrial flutter.  By the time I saw the patient in the office on 03/01/2023, he already self converted back to sinus rhythm.  Patient presents today for follow-up.  He is still holding sinus rhythm based on today's EKG.  He is on at home at night and as needed during the day.  He has shortness of breath with moderate activity due to significant COPD.  I am hesitant to further increase beta-blocker due to significant underlying COPD.  Overall, he is  doing well and can follow-up in 36-month.  If he does have frequent recurrence of A-fib, we will consider up titration of beta-blocker.  ROS:   He denies chest pain, palpitations, pnd, orthopnea, n, v, dizziness, syncope, edema, weight gain, or early satiety. All other systems reviewed and are otherwise negative except as noted above.   Patient has chronic dyspnea with moderate exertion  Studies Reviewed: .        Cardiac Studies & Procedures     STRESS TESTS  MYOCARDIAL PERFUSION IMAGING 07/23/2021  Narrative   The study is normal. The study is low risk.   No ST deviation was noted.   LV perfusion is normal.   Left ventricular function is normal. Nuclear stress EF: 60 %. The left ventricular ejection fraction is normal (55-65%). End diastolic cavity size is normal.   Prior study not available for comparison.   ECHOCARDIOGRAM  ECHOCARDIOGRAM COMPLETE 03/24/2023  Narrative ECHOCARDIOGRAM REPORT    Patient Name:   Brendan Hopkins Date of Exam: 03/24/2023 Medical Rec #:  [id number]         Height:       71.0 in Accession #:    [id number]        Weight:       250.0 lb Date of Birth:  [birth year]        BSA:          2.318 m Patient Age:    14  years          BP:           114/78 mmHg Patient Gender: M                 HR:           71 bpm. Exam Location:  [address]  Procedure: 2D Echo, Cardiac Doppler and Color Doppler  Indications:    I48.0 Paroxysmal atrial fibrillation  History:        Patient has prior history of Echocardiogram examinations, most recent 12/17/2020. COPD, Arrythmias:Atrial Fibrillation and RBBB; Risk Factors:Obesity and Hypertension.  Sonographer:    Samule Ohm RDCS Referring Phys: [id number] Orean Giarratano   Sonographer Comments: Technically difficult study due to poor echo windows and patient is obese. Image acquisition challenging due to respiratory motion, Image acquisition challenging due to COPD and Image acquisition challenging due to patient  body habitus. IMPRESSIONS   1. Left ventricular ejection fraction, by estimation, is 60 to 65%. The left ventricle has normal function. There is severe concentric left ventricular hypertrophy. Left ventricular diastolic parameters are consistent with Grade I diastolic dysfunction (impaired relaxation). 2. Right ventricular systolic function is normal. The right ventricular size is mildly enlarged. 3. Left atrial size was mild to moderately dilated. 4. Trivial mitral valve regurgitation. 5. AV is thickened, calcified with restricted motion. Peak and mean gradients are 31 and 15 mm Hg respectively. AVA (VTI) is 1.91 cm2. Dimensionless index is 0.42 Overall consistent with mild aortic stenosis. Compared to echo from 2022, gradients are increased. . The aortic valve is tricuspid. Aortic valve regurgitation is mild.  FINDINGS Left Ventricle: Left ventricular ejection fraction, by estimation, is 60 to 65%. The left ventricle has normal function. The left ventricular internal cavity size was normal in size. There is severe concentric left ventricular hypertrophy. Left ventricular diastolic parameters are consistent with Grade I diastolic dysfunction (impaired relaxation).  Right Ventricle: The right ventricular size is mildly enlarged. Right vetricular wall thickness was not assessed. Right ventricular systolic function is normal.  Left Atrium: Left atrial size was mild to moderately dilated.  Right Atrium: Right atrial size was normal in size.  Pericardium: There is no evidence of pericardial effusion.  Mitral Valve: There is mild thickening of the mitral valve leaflet(s). Trivial mitral valve regurgitation.  Tricuspid Valve: The tricuspid valve is normal in structure. Tricuspid valve regurgitation is trivial.  Aortic Valve: AV is thickened, calcified with restricted motion. Peak and mean gradients are 31 and 15 mm Hg respectively. AVA (VTI) is 1.91 cm2. Dimensionless index is 0.42 Overall  consistent with mild aortic stenosis. Compared to echo from 2022, gradients are increased. The aortic valve is tricuspid. Aortic valve regurgitation is mild. Aortic regurgitation PHT measures 458 msec. Aortic valve mean gradient measures 12.8 mmHg. Aortic valve peak gradient measures 26.4 mmHg. Aortic valve area, by VTI measures 1.74 cm.  Pulmonic Valve: The pulmonic valve was normal in structure. Pulmonic valve regurgitation is not visualized.  Aorta: The aortic root and ascending aorta are structurally normal, with no evidence of dilitation.  IAS/Shunts: No atrial level shunt detected by color flow Doppler.   LEFT VENTRICLE PLAX 2D LVIDd:         3.90 cm   Diastology LVIDs:         2.80 cm   LV e' medial:    7.76 cm/s LV PW:         1.60 cm   LV E/e' medial:  10.0 LV  IVS:        2.20 cm   LV e' lateral:   9.17 cm/s LVOT diam:     2.30 cm   LV E/e' lateral: 8.4 LV SV:         83 LV SV Index:   36 LVOT Area:     4.15 cm   RIGHT VENTRICLE            IVC RVSP:           31.9 mmHg  IVC diam: 1.90 cm  LEFT ATRIUM             Index        RIGHT ATRIUM           Index LA diam:        5.20 cm 2.24 cm/m   RA Pressure: 3.00 mmHg LA Vol (A2C):   77.5 ml 33.43 ml/m  RA Area:     24.90 cm LA Vol (A4C):   92.1 ml 39.73 ml/m  RA Volume:   73.10 ml  31.53 ml/m LA Biplane Vol: 90.6 ml 39.08 ml/m AORTIC VALVE AV Area (Vmax):    1.66 cm AV Area (Vmean):   1.84 cm AV Area (VTI):     1.74 cm AV Vmax:           256.80 cm/s AV Vmean:          160.800 cm/s AV VTI:            0.475 m AV Peak Grad:      26.4 mmHg AV Mean Grad:      12.8 mmHg LVOT Vmax:         102.72 cm/s LVOT Vmean:        71.380 cm/s LVOT VTI:          0.199 m LVOT/AV VTI ratio: 0.42 AI PHT:            458 msec  AORTA Ao Root diam: 4.00 cm Ao Asc diam:  3.70 cm  MITRAL VALVE                TRICUSPID VALVE MV Area (PHT): 2.25 cm     TR Peak grad:   28.9 mmHg MV Decel Time: 338 msec     TR Vmax:        269.00  cm/s MV E velocity: 77.44 cm/s   Estimated RAP:  3.00 mmHg MV A velocity: 108.20 cm/s  RVSP:           31.9 mmHg MV E/A ratio:  0.72 SHUNTS Systemic VTI:  0.20 m Systemic Diam: 2.30 cm  Dietrich Pates MD Electronically signed by Dietrich Pates MD Signature Date/Time: 03/24/2023/5:01:08 PM    Final             Risk Assessment/Calculations:    CHA2DS2-VASc Score = 3   This indicates a 3.2% annual risk of stroke. The patient's score is based upon: CHF History: 0 HTN History: 1 Diabetes History: 0 Stroke History: 0 Vascular Disease History: 0 Age Score: 2 Gender Score: 0           Physical Exam:   VS:  There were no vitals taken for this visit.   Wt Readings from Last 3 Encounters:  03/21/23 250 lb (113.4 kg)  03/08/23 248 lb 10.9 oz (112.8 kg)  03/03/23 251 lb (113.9 kg)    GEN: Well nourished, well developed in no acute distress NECK: No JVD; No carotid bruits CARDIAC: RRR, no  murmurs, rubs, gallops RESPIRATORY:  Clear to auscultation without rales, wheezing or rhonchi  ABDOMEN: Soft, non-tender, non-distended EXTREMITIES:  No edema; No deformity   ASSESSMENT AND PLAN: .     Atrial Fibrillation Currently in sinus rhythm with frequent premature beats based on EGD. -Continue with metoprolol for rate control  Peripheral Edema 1+ edema noted in lower extremities. Difficulty with compression stockings due to physical limitations and past orthopedic surgeries. -Elevate legs when sitting, especially when watching TV. -Consider obtaining compression stockings if feasible.  Chronic Obstructive Pulmonary Disease (COPD) Baseline shortness of breath, using oxygen at night and as needed during the day. -Continue current management.         Dispo: Follow-up in 4 months  Signed, Azalee Course, [place]

## 2023-05-02 ENCOUNTER — Other Ambulatory Visit: Payer: Self-pay | Admitting: Family Medicine

## 2023-05-02 MED ORDER — ZOLPIDEM TARTRATE 10 MG PO TABS: 10.0000 mg | ORAL_TABLET | Freq: Every evening | ORAL | 5 refills | Status: DC | PRN

## 2023-05-02 NOTE — Telephone Encounter (Signed)
Copied from CRM [id number]. Topic: Clinical - Medication Refill >> May 02, 2023 12:26 PM Fuller Mandril wrote: Most Recent Primary Care Visit:  Provider: Lynnea Ferrier T  Department: [facility]  Visit Type: OFFICE VISIT  Date: 03/21/2023  Medication: ***  Has the patient contacted their pharmacy?  (Agent: If no, request that the patient contact the pharmacy for the refill. If patient does not wish to contact the pharmacy document the reason why and proceed with request.) (Agent: If yes, when and what did the pharmacy advise?)  Is this the correct pharmacy for this prescription?  If no, delete pharmacy and type the correct one.  This is the patient's preferred pharmacy:  [id number] - [place] ([place] - [address] [place] [address] Phone: [phone] Fax: [phone]  [facility] [place] - [address] [place] [address] Phone: [phone] Fax: [phone]   Has the prescription been filled recently?   Is the patient out of the medication?   Has the patient been seen for an appointment in the last year OR does the patient have an upcoming appointment?   Can we respond through MyChart?   Agent: Please be advised that Rx refills may take up to 3 business days. We ask that you follow-up with your pharmacy.

## 2023-05-09 ENCOUNTER — Telehealth: Payer: Self-pay

## 2023-05-09 ENCOUNTER — Other Ambulatory Visit: Payer: Self-pay | Admitting: Family Medicine

## 2023-05-09 MED ORDER — ZOLPIDEM TARTRATE 10 MG PO TABS: 10.0000 mg | ORAL_TABLET | Freq: Every evening | ORAL | 5 refills | Status: DC | PRN

## 2023-05-09 NOTE — Telephone Encounter (Signed)
Copied from CRM [id number]. Topic: Clinical - Medication Refill >> May 09, 2023  3:15 PM Cassiday T wrote: Most Recent Primary Care Visit:  Provider: Lynnea Ferrier T  Department: [facility]  Visit Type: OFFICE VISIT  Date: 03/21/2023  Medication: zolpidem 10mg   Has the patient contacted their pharmacy? Yes (Agent: If no, request that the patient contact the pharmacy for the refill. If patient does not wish to contact the pharmacy document the reason why and proceed with request.) (Agent: If yes, when and what did the pharmacy advise?)  Is this the correct pharmacy for this prescription? Yes If no, delete pharmacy and type the correct one.  This is the patient's preferred pharmacy:  [id number] - [place] ([place]), [place] - [address] [place] ([place] [address] Phone: [phone] Fax: [phone]  [facility] [place] - [address] [place] [address] Phone: [phone] Fax: [phone]   Has the prescription been filled recently? No  Is the patient out of the medication? Yes  Has the patient been seen for an appointment in the last year OR does the patient have an upcoming appointment? Yes  Can we respond through MyChart? No  Agent: Please be advised that Rx refills may take up to 3 business days. We ask that you follow-up with your pharmacy.

## 2023-05-26 ENCOUNTER — Telehealth: Payer: Self-pay

## 2023-05-26 NOTE — Telephone Encounter (Signed)
 Calling pt regarding his PAP [employer] spoke with pt explain he has not meet deductible of $600 before he can apply again,pt will call back when he has meet deductible.

## 2023-05-29 ENCOUNTER — Other Ambulatory Visit: Payer: Self-pay | Admitting: Family Medicine

## 2023-06-05 ENCOUNTER — Other Ambulatory Visit: Payer: Self-pay | Admitting: Family Medicine

## 2023-06-07 NOTE — Telephone Encounter (Signed)
 Requested Prescriptions  Pending Prescriptions Disp Refills   albuterol  (VENTOLIN  HFA) 108 (90 Base) MCG/ACT inhaler [Pharmacy Med Name: Albuterol  Sulfate HFA 69mcg/actuation Inhalation Aerosol] 6.7 each 2    Sig: Inhale 1 to 2 puffs by mouth every 6 hours as needed for shortness of breath     Pulmonology:  Beta Agonists 2 Failed - 06/07/2023  9:35 AM      Failed - Valid encounter within last 12 months    Recent Outpatient Visits           1 year ago Chronic obstructive pulmonary disease, unspecified COPD type (HCC)   [facility] Pickard, Butler DASEN, MD   1 year ago Left sided sciatica   [facility] Duanne Butler DASEN, MD   2 years ago Vertigo   [facility] Duanne Butler DASEN, MD   2 years ago S/P total left hip arthroplasty   [facility] Duanne Butler DASEN, MD   2 years ago Chronic obstructive pulmonary disease, unspecified COPD type (HCC)   [facility] Pickard, Butler DASEN, MD       Future Appointments             In 2 days Kassie Acquanetta Bradley, MD [facility], [place]            Passed - Last BP in normal range    BP Readings from Last 1 Encounters:  04/27/23 138/70         Passed - Last Heart Rate in normal range    Pulse Readings from Last 1 Encounters:  04/27/23 70

## 2023-06-09 ENCOUNTER — Other Ambulatory Visit (HOSPITAL_COMMUNITY): Payer: Self-pay

## 2023-06-09 ENCOUNTER — Encounter (HOSPITAL_BASED_OUTPATIENT_CLINIC_OR_DEPARTMENT_OTHER): Payer: Self-pay | Admitting: Pulmonary Disease

## 2023-06-09 ENCOUNTER — Ambulatory Visit (HOSPITAL_BASED_OUTPATIENT_CLINIC_OR_DEPARTMENT_OTHER): Payer: Medicare Other | Admitting: Pulmonary Disease

## 2023-06-09 VITALS — BP 130/70 | HR 74 | Resp 16 | Ht 71.0 in | Wt 254.5 lb

## 2023-06-09 DIAGNOSIS — J449 Chronic obstructive pulmonary disease, unspecified: Secondary | ICD-10-CM | POA: Diagnosis not present

## 2023-06-09 DIAGNOSIS — J9611 Chronic respiratory failure with hypoxia: Secondary | ICD-10-CM

## 2023-06-09 MED ORDER — TRELEGY ELLIPTA 200-62.5-25 MCG/ACT IN AEPB: 1.0000 | INHALATION_SPRAY | Freq: Every day | RESPIRATORY_TRACT | 5 refills | Status: DC
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
## 2023-06-09 NOTE — Patient Instructions (Signed)
COPD with emphysema --CONTINUE Trelegy 200 ONE puff ONCE a day --CONTINUE Albuterol AS NEEDED for shortness of breath and wheezing --Completed pulmonary rehab in fall 2024  Chronic hypoxemic respiratory failure --Counseled on oxygen compliance --Recommend wearing pulsed 3L O2 with activity --Recommend continuous 3L with sleep  --Plan for ambulatory O2 at next visit

## 2023-06-13 ENCOUNTER — Telehealth: Payer: Self-pay

## 2023-06-13 NOTE — Telephone Encounter (Signed)
Do you want him to come in an see you first or go ahead and send out a referral for him? Thank you.  Copied from CRM [id number]. Topic: Clinical - Medical Advice >> Jun 13, 2023  8:30 AM Brendan Hopkins wrote: Patient is calling about an issue he has been having for some time with a bubble on his left elbow that he has had to have drained in the past but it was back to being full the next day. He said he was been visiting with Dr. Tanya Nones and they had talked about a referral somewhere else to get this looked at. It keeps flaring up and he is starting to feel the effects of it in his hand.

## 2023-06-14 ENCOUNTER — Other Ambulatory Visit: Payer: Self-pay

## 2023-06-14 DIAGNOSIS — M7020 Olecranon bursitis, unspecified elbow: Secondary | ICD-10-CM

## 2023-06-16 ENCOUNTER — Ambulatory Visit: Payer: Medicare Other | Admitting: Physician Assistant

## 2023-06-16 ENCOUNTER — Encounter: Payer: Self-pay | Admitting: Physician Assistant

## 2023-06-16 ENCOUNTER — Other Ambulatory Visit (INDEPENDENT_AMBULATORY_CARE_PROVIDER_SITE_OTHER): Payer: Medicare Other

## 2023-06-16 ENCOUNTER — Other Ambulatory Visit: Payer: Self-pay | Admitting: Family Medicine

## 2023-06-16 DIAGNOSIS — M25522 Pain in left elbow: Secondary | ICD-10-CM | POA: Diagnosis not present

## 2023-06-16 NOTE — Telephone Encounter (Signed)
Requested medication (s) are due for refill today: yes  Requested medication (s) are on the active medication list: yes  Last refill:  04/21/23  Future visit scheduled: no  Notes to clinic:  Unable to refill per protocol, cannot delegate.      Requested Prescriptions  Pending Prescriptions Disp Refills   meclizine (ANTIVERT) 25 MG tablet [Pharmacy Med Name: Meclizine HCl 25 MG Oral Tablet] 30 tablet 0    Sig: TAKE 1 TABLET BY MOUTH THREE TIMES DAILY AS NEEDED FOR DIZZINESS     Not Delegated - Gastroenterology: Antiemetics Failed - 06/16/2023  3:34 PM      Failed - This refill cannot be delegated      Failed - Valid encounter within last 6 months    Recent Outpatient Visits           1 year ago Chronic obstructive pulmonary disease, unspecified COPD type (HCC)   [facility] Pickard, Priscille Heidelberg, MD   1 year ago Left sided sciatica   [facility] Donita Brooks, MD   2 years ago Vertigo   [facility] Donita Brooks, MD   2 years ago S/P total left hip arthroplasty   [facility] Donita Brooks, MD   2 years ago Chronic obstructive pulmonary disease, unspecified COPD type (HCC)   [facility] Pickard, Priscille Heidelberg, MD
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
## 2023-06-27 ENCOUNTER — Ambulatory Visit: Payer: Medicare Other | Admitting: Orthopedic Surgery

## 2023-06-27 DIAGNOSIS — M25522 Pain in left elbow: Secondary | ICD-10-CM

## 2023-06-27 DIAGNOSIS — M7022 Olecranon bursitis, left elbow: Secondary | ICD-10-CM | POA: Diagnosis not present

## 2023-06-28 ENCOUNTER — Encounter: Payer: Self-pay | Admitting: Orthopedic Surgery
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
## 2023-06-29 ENCOUNTER — Encounter (HOSPITAL_COMMUNITY): Payer: Self-pay | Admitting: Orthopedic Surgery

## 2023-06-29 ENCOUNTER — Other Ambulatory Visit: Payer: Self-pay

## 2023-06-29 LAB — URIC ACID: Uric Acid, Serum: 6.6 mg/dL (ref 4.0–8.0)

## 2023-06-29 NOTE — Progress Notes (Signed)
 PCP - Dr Butler Burr Cardiologist - Dr Redell Shallow  Gastroenterology - Dr Lupita Commander Pulmonology - Dr Acquanetta Slater Staff  CT Chest x-ray - 10/08/22 EKG - 04/27/23 Stress Test - 07/23/21 ECHO - 03/24/23 Cardiac Cath - n/a  ICD Pacemaker/Loop - n/a  Sleep Study -  n/a  Diabetes - n/a  Blood Thinner Instructions:  Follow your surgeon's instructions on when to stop Xarelto  prior to surgery.  If no instructions were given by your surgeon then you will need to call the office for those instructions.  Patient agrees to call MD for instructions tomorrow.  Office phone number given to patient.  Aspirin  Instructions: n/a  ERAS - Clear liquids til 6 AM DOS.  Anesthesia review: Yes  STOP now taking any Aspirin  (unless otherwise instructed by your surgeon), Aleve, Naproxen, Ibuprofen , Motrin , Advil , Goody's, BC's, all herbal medications, fish oil, and all vitamins.   Coronavirus Screening Do you have any of the following symptoms:  Cough Yes Fever (>100.44F)  yes/no: No Runny nose yes/no: No Sore throat yes/no: No Difficulty breathing/shortness of breath  Yes- COPD  Have you traveled in the last 14 days and where? yes/no: No  Patient verbalized understanding of instructions that were given via phone.

## 2023-06-30 ENCOUNTER — Telehealth: Payer: Self-pay

## 2023-06-30 ENCOUNTER — Other Ambulatory Visit: Payer: Self-pay

## 2023-06-30 MED ORDER — COLCHICINE 0.6 MG PO TABS: 0.6000 mg | ORAL_TABLET | Freq: Every day | ORAL | 2 refills | Status: DC

## 2023-06-30 NOTE — Progress Notes (Signed)
 Anesthesia Chart Review: Brendan Hopkins  Case: [id number] Date/Time: 07/01/23 0845   Procedure: EXCISION LEFT ELBOW OLECRANON (ELBOW) BURSA (Left: Elbow)   Anesthesia type: Choice   Pre-op diagnosis: Olecranon Bursitis Left Elbow   [facility]: [facility] RO[facility] 07 / [facility]   Surgeons: Harden Jerona GAILS, MD       DISCUSSION: Patient is an 88 year old male scheduled for the above procedure.   History includes former smoker (quit 2009), HTN, COPD (home O2), PAF (August 2023, started on Xarelto  & b-blocker), exertional dyspnea, fatty liver, GERD, CKD, TIA, osteoarthritis (right THA 01/09/13; left THA 02/24/21), DVT (RLE DVT 08/03/19 in setting post-op ORIF right trimalleolar fracture 07/11/19, s/p Xarelto  x 6 months), skin cancer, gout.  Last visit with pulmonologist Dr. Kassie was on 06/09/23. Feeling well at that time, on Trelegy. Dyspnea unchanged. Coughing throughout the day but better after able to expel excretions. Albuterol  before bedtime recommended to help with nighttime coughing. He completed pulmonary rehab in Fall 2024.  Counseled on compliance with home O2.  She recommended wearing pulsed 3 L O2 with activity and continuous 3 L with sleep.  Last cardiology visit was on 04/27/23 with Janene Boer, PA. In SR at that time with PACs. Continue metoprolol . Elevate legs and use compression stockings as able for LE edema. Echo 03/24/23 showed the EF 60 to 65%, severe concentric LVH, grade 1 diastolic dysfunction, normal RV systolic function, mild RVH, trivial MR, mild aortic stenosis.  Normal/low risk stress test 07/23/2021.  4 months cardiology follow-up planned at last visit.    He was prescribed colchicine  on 06/29/2023 by Dr. Harden after uric acid came back at 6.6.   Anesthesia team to evaluate on the day of surgery. He was advised to follow-up with Dr. Harden if he was not given perioperative Xarelto  instructions.    VS: Ht 5' 11 (1.803 m)   Wt 115.4 kg   BMI 35.48 kg/m  BP Readings from Last 3 Encounters:   06/09/23 130/70  04/27/23 138/70  03/23/23 132/76   Pulse Readings from Last 3 Encounters:  06/09/23 74  04/27/23 70  03/23/23 71     PROVIDERS: Duanne Butler DASEN, MD is PCP  Kassie Acquanetta Bradley, MD is pulmonologist Pietro Rogue, MD is cardiologist   LABS: For day of surgery as indicated. Last results in [facility] include: Lab Results  Component Value Date   WBC 7.4 09/30/2022   HGB 11.6 (L) 09/30/2022   HCT 35.5 (L) 09/30/2022   PLT 283.0 09/30/2022   GLUCOSE 158 (H) 08/30/2022   ALT 17 08/27/2022   AST 25 08/27/2022   NA 135 08/30/2022   K 4.7 08/30/2022   CL 105 08/30/2022   CREATININE 1.22 08/30/2022   BUN 32 (H) 08/30/2022   CO2 21 (L) 08/30/2022   TSH 2.675 07/03/2022     IMAGES: CT Chest 10/08/22: IMPRESSION: 1. Mild atelectasis at the lung bases. No acute abnormality is seen. 2. 6 mm pulmonary nodules in the right lung, unchanged from the previous exam. If the nodules are stable at time of repeat CT, then future CT at 18-24 months (from today's scan) is considered optional for low-risk patients, but is recommended for high-risk patients. This recommendation follows the consensus statement: Guidelines for Management of Incidental Pulmonary Nodules Detected on CT Images: From the Fleischner Society 2017; Radiology 2017; 284:228-243. 3. Severe emphysema. 4. Aortic atherosclerosis and coronary artery calcifications.    EKG: Sinus rhythm with Premature supraventricular complexes Right bundle branch block Left  anterior fascicular block Bifascicular block When compared with ECG of 01-Mar-2023 09:23, Premature supraventricular complexes are now Present Confirmed by Meng, Hao [phone]) on 04/27/2023 12:51:03 PM   CV: Echo 03/24/23: IMPRESSIONS   1. Left ventricular ejection fraction, by estimation, is 60 to 65%. The  left ventricle has normal function. There is severe concentric left  ventricular hypertrophy. Left ventricular diastolic parameters are   consistent with Grade I diastolic dysfunction  (impaired relaxation).   2. Right ventricular systolic function is normal. The right ventricular  size is mildly enlarged.   3. Left atrial size was mild to moderately dilated.   4. Trivial mitral valve regurgitation.   5. AV is thickened, calcified with restricted motion. Peak and mean  gradients are 31 and 15 mm Hg respectively. AVA (VTI) is 1.91 cm2.  Dimensionless index is 0.42 Overall consistent with mild aortic stenosis.  Compared to echo from 2022, gradients are  increased. . The aortic valve is tricuspid. Aortic valve regurgitation is  mild.   Nuclear stress test 07/23/21:   The study is normal. The study is low risk.   No ST deviation was noted.   LV perfusion is normal.   Left ventricular function is normal. Nuclear stress EF: 60 %. The left ventricular ejection fraction is normal (55-65%). End diastolic cavity size is normal.   Prior study not available for comparison.  US  Carotid 12/02/11: Summary:  Bilateral: mild soft palque origin ICA. No significant ICA  stenosis. Vertebral artery flow is antegrade.    Past Medical History:  Diagnosis Date   A-fib [facility])    Acute deep vein thrombosis (DVT) of tibial vein of right lower extremity (HCC)    after ankle fracture/surgery (2/21)   Arthritis    RIGHT HIP, HANDS, BACK   Cancer (HCC)    Skin cancer - face, neck and back   Cataract    bilateral - removed   Cervical radiculopathy    severe C3-4 neuroforaminal stenosis, C5-6 left synovial cyst compressing left nerve root.   Chronic kidney disease    stage 3a   Colon polyps    2005   COPD (chronic obstructive pulmonary disease) (HCC)    SMOKED FOR 25 YRS   Cystoid macular degeneration of both eyes    Diverticulosis    2005   Fatty liver    GERD (gastroesophageal reflux disease)    Hard of hearing    has hearing aids   History of kidney stones    Hypertension    Leg fracture, right    Oxygen  dependent    2L via [place].   Patient wears oxygen  at night and when need during day   Pain    RIGHT HIP AND BACK   Personal history of colonic polyps-adenoma and sessile serrated polyp 07/15/2010   Pneumonia 03/2019   x several   RBBB    HX AFIB IN PAST- PAC'S   Shortness of breath    WITH EXERTION ONLY   Stroke (HCC)    TIA hx   Wears glasses     Past Surgical History:  Procedure Laterality Date   COLONOSCOPY     maybe 3    COLONOSCOPY W/ POLYPECTOMY     CYSTOSCOPY WITH RETROGRADE PYELOGRAM, URETEROSCOPY AND STENT PLACEMENT Left 12/11/2019   Procedure: CYSTOSCOPY WITH LEFT RETROGRADE LEFT  URETEROSCOPY WITH HOLMIUM LASER AND STENT PLACEMENT;  Surgeon: Watt Rush, MD;  Location: [facility]S;  Service: Urology;  Laterality: Left;   EYE SURGERY Bilateral  x 5   ORIF ANKLE FRACTURE Right 07/11/2019   Procedure: OPEN REDUCTION INTERNAL FIXATION (ORIF) ANKLE FRACTURE;  Surgeon: Kendal Franky SQUIBB, MD;  Location: [facility];  Service: Orthopedics;  Laterality: Right;   skin cancers removed      TOTAL HIP ARTHROPLASTY Right 01/09/2013   Procedure: RIGHT TOTAL HIP ARTHROPLASTY ANTERIOR APPROACH;  Surgeon: Donnice JONETTA Car, MD;  Location: [facility];  Service: Orthopedics;  Laterality: Right;   TOTAL HIP ARTHROPLASTY Left 02/24/2021   Procedure: TOTAL HIP ARTHROPLASTY ANTERIOR APPROACH;  Surgeon: Car Donnice, MD;  Location: [facility];  Service: Orthopedics;  Laterality: Left;    MEDICATIONS: No current facility-administered medications for this encounter.    acetaminophen  (TYLENOL ) 650 MG CR tablet   albuterol  (VENTOLIN  HFA) 108 (90 Base) MCG/ACT inhaler   amLODipine  (NORVASC ) 5 MG tablet   Fluticasone -Umeclidin-Vilant (TRELEGY ELLIPTA ) 200-62.5-25 MCG/ACT AEPB   furosemide  (LASIX ) 40 MG tablet   gabapentin  (NEURONTIN ) 300 MG capsule   Iron , Ferrous Sulfate , 325 (65 Fe) MG TABS   meclizine  (ANTIVERT ) 25 MG tablet   metoprolol  succinate (TOPROL -XL) 25 MG 24 hr tablet   Multiple Vitamin (MULTIVITAMIN WITH MINERALS) TABS    Omega-3 Fatty Acids (FISH OIL) 1200 MG CAPS   OXYGEN    pantoprazole  (PROTONIX ) 40 MG tablet   rivaroxaban  (XARELTO ) 20 MG TABS tablet   Specialty Vitamins Products (MEMORY COMPLEX BRAIN HEALTH PO)   zolpidem  (AMBIEN ) 10 MG tablet   colchicine  0.6 MG tablet    Isaiah Ruder, PA-C Surgical Short Stay/Anesthesiology [facility] Phone [phone] [facility] Phone [phone] 06/30/2023 11:37 AM

## 2023-06-30 NOTE — Telephone Encounter (Signed)
-----   Message from Jerona LULLA Sage sent at 06/29/2023 10:55 AM EST ----- Uric acid 6.6.  Call patient and have him start colchicine  0.6 mg a day.  Follow-up as scheduled in 4 weeks.  See what response he got to the ankle steroid injection. ----- Message ----- From: Interface, [facility] Lab Results In Sent: 06/29/2023   7:04 AM EST To: Jerona Sage LULLA, MD

## 2023-06-30 NOTE — Anesthesia Preprocedure Evaluation (Addendum)
 Anesthesia Evaluation  Patient identified by MRN, date of birth, ID band Patient awake    Reviewed: Allergy & Precautions, H&P , NPO status , Patient's Chart, lab work & pertinent test results  Airway Mallampati: III  TM Distance: >3 FB Neck ROM: Full    Dental no notable dental hx. (+) Edentulous Upper, Edentulous Lower, Dental Advisory Given   Pulmonary shortness of breath, COPD,  COPD inhaler and oxygen  dependent, former smoker   Pulmonary exam normal breath sounds clear to auscultation       Cardiovascular Exercise Tolerance: Good hypertension, Pt. on medications and Pt. on home beta blockers + dysrhythmias Atrial Fibrillation + Valvular Problems/Murmurs AS  Rhythm:Regular Rate:Normal     Neuro/Psych  negative psych ROS   GI/Hepatic Neg liver ROS,GERD  Medicated,,  Endo/Other    Class 3 obesity  Renal/GU negative Renal ROS  negative genitourinary   Musculoskeletal  (+) Arthritis , Osteoarthritis,    Abdominal   Peds  Hematology  (+) Blood dyscrasia, anemia   Anesthesia Other Findings   Reproductive/Obstetrics negative OB ROS                             Anesthesia Physical Anesthesia Plan  ASA: 3  Anesthesia Plan: General   Post-op Pain Management: Tylenol  PO (pre-op)*   Induction: Intravenous  PONV Risk Score and Plan: 2 and Ondansetron , Propofol  infusion and Treatment may vary due to age or medical condition  Airway Management Planned: LMA  Additional Equipment:   Intra-op Plan:   Post-operative Plan: Extubation in OR  Informed Consent: I have reviewed the patients History and Physical, chart, labs and discussed the procedure including the risks, benefits and alternatives for the proposed anesthesia with the patient or authorized representative who has indicated his/her understanding and acceptance.     Dental advisory given  Plan Discussed with: CRNA  Anesthesia  Plan Comments: (PAT note written 06/30/2023 by Allison Zelenak, PA-C. Pt refuses block due to his COPD. Had shoulder surgery with a block and was short of breath and thought he was going to die.  )       Anesthesia Quick Evaluation

## 2023-06-30 NOTE — Telephone Encounter (Signed)
 I called pt to advise of results and that rx has been sent to pharmacy.

## 2023-07-01 ENCOUNTER — Encounter (HOSPITAL_COMMUNITY): Payer: Self-pay | Admitting: Orthopedic Surgery

## 2023-07-01 ENCOUNTER — Encounter (HOSPITAL_COMMUNITY)
Admission: RE | Disposition: A | Discharge status: Home / Self Care | Payer: Self-pay | Source: Home / Self Care | Attending: Orthopedic Surgery

## 2023-07-01 ENCOUNTER — Other Ambulatory Visit: Payer: Self-pay

## 2023-07-01 ENCOUNTER — Telehealth: Payer: Self-pay | Admitting: Orthopedic Surgery

## 2023-07-01 ENCOUNTER — Ambulatory Visit (HOSPITAL_COMMUNITY): Payer: Medicare Other | Admitting: Vascular Surgery

## 2023-07-01 ENCOUNTER — Emergency Department (HOSPITAL_COMMUNITY)
Admission: EM | Admit: 2023-07-01 | Discharge: 2023-07-01 | Disposition: A | Discharge status: Home / Self Care | Payer: Medicare Other | Attending: Emergency Medicine | Admitting: Emergency Medicine

## 2023-07-01 ENCOUNTER — Ambulatory Visit (HOSPITAL_COMMUNITY)
Admission: RE | Admit: 2023-07-01 | Discharge: 2023-07-01 | Disposition: A | Discharge status: Home / Self Care | Payer: Medicare Other | Attending: Orthopedic Surgery | Admitting: Orthopedic Surgery

## 2023-07-01 DIAGNOSIS — M109 Gout, unspecified: Secondary | ICD-10-CM | POA: Insufficient documentation

## 2023-07-01 DIAGNOSIS — Z96643 Presence of artificial hip joint, bilateral: Secondary | ICD-10-CM | POA: Insufficient documentation

## 2023-07-01 DIAGNOSIS — Z8673 Personal history of transient ischemic attack (TIA), and cerebral infarction without residual deficits: Secondary | ICD-10-CM | POA: Insufficient documentation

## 2023-07-01 DIAGNOSIS — E66813 Obesity, class 3: Secondary | ICD-10-CM | POA: Diagnosis not present

## 2023-07-01 DIAGNOSIS — Z6835 Body mass index (BMI) 35.0-35.9, adult: Secondary | ICD-10-CM | POA: Insufficient documentation

## 2023-07-01 DIAGNOSIS — K219 Gastro-esophageal reflux disease without esophagitis: Secondary | ICD-10-CM | POA: Insufficient documentation

## 2023-07-01 DIAGNOSIS — D631 Anemia in chronic kidney disease: Secondary | ICD-10-CM | POA: Diagnosis not present

## 2023-07-01 DIAGNOSIS — Z7901 Long term (current) use of anticoagulants: Secondary | ICD-10-CM | POA: Insufficient documentation

## 2023-07-01 DIAGNOSIS — Z87891 Personal history of nicotine dependence: Secondary | ICD-10-CM | POA: Diagnosis not present

## 2023-07-01 DIAGNOSIS — L7622 Postprocedural hemorrhage and hematoma of skin and subcutaneous tissue following other procedure: Secondary | ICD-10-CM | POA: Insufficient documentation

## 2023-07-01 DIAGNOSIS — K760 Fatty (change of) liver, not elsewhere classified: Secondary | ICD-10-CM | POA: Insufficient documentation

## 2023-07-01 DIAGNOSIS — I129 Hypertensive chronic kidney disease with stage 1 through stage 4 chronic kidney disease, or unspecified chronic kidney disease: Secondary | ICD-10-CM | POA: Insufficient documentation

## 2023-07-01 DIAGNOSIS — Z9981 Dependence on supplemental oxygen: Secondary | ICD-10-CM | POA: Insufficient documentation

## 2023-07-01 DIAGNOSIS — M7022 Olecranon bursitis, left elbow: Secondary | ICD-10-CM

## 2023-07-01 DIAGNOSIS — Z79899 Other long term (current) drug therapy: Secondary | ICD-10-CM | POA: Insufficient documentation

## 2023-07-01 DIAGNOSIS — Z86718 Personal history of other venous thrombosis and embolism: Secondary | ICD-10-CM | POA: Diagnosis not present

## 2023-07-01 DIAGNOSIS — Z85828 Personal history of other malignant neoplasm of skin: Secondary | ICD-10-CM | POA: Insufficient documentation

## 2023-07-01 DIAGNOSIS — N1831 Chronic kidney disease, stage 3a: Secondary | ICD-10-CM | POA: Insufficient documentation

## 2023-07-01 DIAGNOSIS — J439 Emphysema, unspecified: Secondary | ICD-10-CM | POA: Diagnosis not present

## 2023-07-01 DIAGNOSIS — E785 Hyperlipidemia, unspecified: Secondary | ICD-10-CM | POA: Diagnosis not present

## 2023-07-01 DIAGNOSIS — Z4801 Encounter for change or removal of surgical wound dressing: Secondary | ICD-10-CM | POA: Insufficient documentation

## 2023-07-01 DIAGNOSIS — Z5189 Encounter for other specified aftercare: Secondary | ICD-10-CM

## 2023-07-01 DIAGNOSIS — N189 Chronic kidney disease, unspecified: Secondary | ICD-10-CM | POA: Insufficient documentation

## 2023-07-01 DIAGNOSIS — I4891 Unspecified atrial fibrillation: Secondary | ICD-10-CM | POA: Insufficient documentation

## 2023-07-01 HISTORY — DX: Cystoid macular degeneration, bilateral: H35.353

## 2023-07-01 HISTORY — PX: OLECRANON BURSECTOMY: SHX2097

## 2023-07-01 HISTORY — DX: Chronic kidney disease, unspecified: N18.9

## 2023-07-01 HISTORY — DX: Dependence on supplemental oxygen: Z99.81

## 2023-07-01 LAB — CBC WITH DIFFERENTIAL/PLATELET
Abs Immature Granulocytes: 0.03 10*3/uL (ref 0.00–0.07)
Basophils Absolute: 0 10*3/uL (ref 0.0–0.1)
Basophils Relative: 0 %
Eosinophils Absolute: 0 10*3/uL (ref 0.0–0.5)
Eosinophils Relative: 0 %
HCT: 40.4 % (ref 39.0–52.0)
Hemoglobin: 13.3 g/dL (ref 13.0–17.0)
Immature Granulocytes: 1 %
Lymphocytes Relative: 14 %
Lymphs Abs: 0.7 10*3/uL (ref 0.7–4.0)
MCH: 32 pg (ref 26.0–34.0)
MCHC: 32.9 g/dL (ref 30.0–36.0)
MCV: 97.1 fL (ref 80.0–100.0)
Monocytes Absolute: 0.1 10*3/uL (ref 0.1–1.0)
Monocytes Relative: 1 %
Neutro Abs: 4.3 10*3/uL (ref 1.7–7.7)
Neutrophils Relative %: 84 %
Platelets: 252 10*3/uL (ref 150–400)
RBC: 4.16 MIL/uL — ABNORMAL LOW (ref 4.22–5.81)
RDW: 13.5 % (ref 11.5–15.5)
WBC: 5.1 10*3/uL (ref 4.0–10.5)
nRBC: 0 % (ref 0.0–0.2)

## 2023-07-01 LAB — BASIC METABOLIC PANEL
Anion gap: 11 (ref 5–15)
BUN: 18 mg/dL (ref 8–23)
CO2: 23 mmol/L (ref 22–32)
Calcium: 9.3 mg/dL (ref 8.9–10.3)
Chloride: 104 mmol/L (ref 98–111)
Creatinine, Ser: 1.15 mg/dL (ref 0.61–1.24)
GFR, Estimated: >60 mL/min (ref >60)
Glucose, Bld: 118 mg/dL — ABNORMAL HIGH (ref 70–99)
Potassium: 4.2 mmol/L (ref 3.5–5.1)
Sodium: 138 mmol/L (ref 135–145)

## 2023-07-01 LAB — CBC
HCT: 41.2 % (ref 39.0–52.0)
Hemoglobin: 13.8 g/dL (ref 13.0–17.0)
MCH: 32.1 pg (ref 26.0–34.0)
MCHC: 33.5 g/dL (ref 30.0–36.0)
MCV: 95.8 fL (ref 80.0–100.0)
Platelets: 201 10*3/uL (ref 150–400)
RBC: 4.3 MIL/uL (ref 4.22–5.81)
RDW: 13.7 % (ref 11.5–15.5)
WBC: 6.5 10*3/uL (ref 4.0–10.5)
nRBC: 0 % (ref 0.0–0.2)

## 2023-07-01 SURGERY — BURSECTOMY, ELBOW
Anesthesia: General | Site: Elbow | Laterality: Left

## 2023-07-01 MED ORDER — DEXAMETHASONE SODIUM PHOSPHATE 10 MG/ML IJ SOLN
INTRAMUSCULAR | Status: DC | PRN
  Administered 2023-07-01: 5 mg via INTRAVENOUS

## 2023-07-01 MED ORDER — PROPOFOL 10 MG/ML IV BOLUS
INTRAVENOUS | Status: DC | PRN
  Administered 2023-07-01 (×2): 100 mg via INTRAVENOUS

## 2023-07-01 MED ORDER — 0.9 % SODIUM CHLORIDE (POUR BTL) OPTIME
TOPICAL | Status: DC | PRN
  Administered 2023-07-01: 1000 mL

## 2023-07-01 MED ORDER — ACETAMINOPHEN 500 MG PO TABS
1000.0000 mg | ORAL_TABLET | Freq: Once | ORAL | Status: DC
  Filled 2023-07-01: qty 2

## 2023-07-01 MED ORDER — LIDOCAINE 2% (20 MG/ML) 5 ML SYRINGE
INTRAMUSCULAR | Status: AC
  Filled 2023-07-01: qty 5

## 2023-07-01 MED ORDER — PROPOFOL 10 MG/ML IV BOLUS
INTRAVENOUS | Status: AC
  Filled 2023-07-01: qty 20

## 2023-07-01 MED ORDER — ORAL CARE MOUTH RINSE: 15.0000 mL | Freq: Once | OROMUCOSAL | Status: AC

## 2023-07-01 MED ORDER — CEFAZOLIN SODIUM-DEXTROSE 2-4 GM/100ML-% IV SOLN
2.0000 g | INTRAVENOUS | Status: AC
  Administered 2023-07-01: 2 g via INTRAVENOUS
  Filled 2023-07-01: qty 100

## 2023-07-01 MED ORDER — CHLORHEXIDINE GLUCONATE 0.12 % MT SOLN
15.0000 mL | Freq: Once | OROMUCOSAL | Status: AC
  Administered 2023-07-01: 15 mL via OROMUCOSAL
  Filled 2023-07-01: qty 15

## 2023-07-01 MED ORDER — FENTANYL CITRATE (PF) 100 MCG/2ML IJ SOLN
INTRAMUSCULAR | Status: AC
  Filled 2023-07-01: qty 2

## 2023-07-01 MED ORDER — FENTANYL CITRATE (PF) 250 MCG/5ML IJ SOLN
INTRAMUSCULAR | Status: AC
  Filled 2023-07-01: qty 5

## 2023-07-01 MED ORDER — ONDANSETRON HCL 4 MG/2ML IJ SOLN
INTRAMUSCULAR | Status: AC
  Filled 2023-07-01: qty 2

## 2023-07-01 MED ORDER — OXYCODONE-ACETAMINOPHEN 5-325 MG PO TABS: 1.0000 | ORAL_TABLET | ORAL | 0 refills | Status: DC | PRN

## 2023-07-01 MED ORDER — ONDANSETRON HCL 4 MG/2ML IJ SOLN
INTRAMUSCULAR | Status: DC | PRN
  Administered 2023-07-01: 4 mg via INTRAVENOUS

## 2023-07-01 MED ORDER — LIDOCAINE 2% (20 MG/ML) 5 ML SYRINGE
INTRAMUSCULAR | Status: DC | PRN
  Administered 2023-07-01: 60 mg via INTRAVENOUS

## 2023-07-01 MED ORDER — VASHE WOUND IRRIGATION OPTIME
TOPICAL | Status: DC | PRN
  Administered 2023-07-01: 34 [oz_av]

## 2023-07-01 MED ORDER — FENTANYL CITRATE (PF) 250 MCG/5ML IJ SOLN
INTRAMUSCULAR | Status: DC | PRN
  Administered 2023-07-01 (×2): 25 ug via INTRAVENOUS

## 2023-07-01 MED ORDER — FENTANYL CITRATE (PF) 100 MCG/2ML IJ SOLN
25.0000 ug | INTRAMUSCULAR | Status: DC | PRN
  Administered 2023-07-01: 25 ug via INTRAVENOUS

## 2023-07-01 MED ORDER — LACTATED RINGERS IV SOLN: INTRAVENOUS | Status: DC

## 2023-07-01 MED ORDER — DEXAMETHASONE SODIUM PHOSPHATE 10 MG/ML IJ SOLN
INTRAMUSCULAR | Status: AC
  Filled 2023-07-01: qty 1

## 2023-07-01 SURGICAL SUPPLY — 46 items
BAG COUNTER SPONGE SURGICOUNT (BAG) ×1 IMPLANT
BLADE AVERAGE 25X9 (BLADE) IMPLANT
BLADE MINI RND TIP GREEN BEAV (BLADE) IMPLANT
BNDG COHESIVE 1X5 TAN STRL LF (GAUZE/BANDAGES/DRESSINGS) IMPLANT
BNDG COHESIVE 6X5 TAN NS LF (GAUZE/BANDAGES/DRESSINGS) IMPLANT
BNDG COHESIVE 6X5 TAN ST LF (GAUZE/BANDAGES/DRESSINGS) IMPLANT
BNDG ESMARK 4X9 LF (GAUZE/BANDAGES/DRESSINGS) ×1 IMPLANT
BNDG GAUZE DERMACEA FLUFF 4 (GAUZE/BANDAGES/DRESSINGS) IMPLANT
CORD BIPOLAR FORCEPS 12FT (ELECTRODE) ×1 IMPLANT
COTTON STERILE ROLL (GAUZE/BANDAGES/DRESSINGS) IMPLANT
COVER SURGICAL LIGHT HANDLE (MISCELLANEOUS) ×2 IMPLANT
CUFF TOURN SGL QUICK 18X4 (TOURNIQUET CUFF) IMPLANT
CUFF TRNQT CYL 24X4X16.5-23 (TOURNIQUET CUFF) IMPLANT
DRAPE OEC MINIVIEW 54X84 (DRAPES) IMPLANT
DRAPE U-SHAPE 47X51 STRL (DRAPES) ×1 IMPLANT
DRSG ADAPTIC 3X8 NADH LF (GAUZE/BANDAGES/DRESSINGS) IMPLANT
DURAPREP 26ML APPLICATOR (WOUND CARE) ×1 IMPLANT
ELECT REM PT RETURN 9FT ADLT (ELECTROSURGICAL) ×1
ELECTRODE REM PT RTRN 9FT ADLT (ELECTROSURGICAL) ×1 IMPLANT
GAUZE PAD ABD 8X10 STRL (GAUZE/BANDAGES/DRESSINGS) IMPLANT
GAUZE SPONGE 2X2 8PLY STRL LF (GAUZE/BANDAGES/DRESSINGS) IMPLANT
GAUZE SPONGE 4X4 12PLY STRL (GAUZE/BANDAGES/DRESSINGS) IMPLANT
GAUZE SPONGE 4X4 12PLY STRL LF (GAUZE/BANDAGES/DRESSINGS) IMPLANT
GLOVE BIOGEL PI IND STRL 9 (GLOVE) ×1 IMPLANT
GLOVE SURG ORTHO 9.0 STRL STRW (GLOVE) ×1 IMPLANT
GOWN STRL REUS W/ TWL XL LVL3 (GOWN DISPOSABLE) ×2 IMPLANT
KIT BASIN OR (CUSTOM PROCEDURE TRAY) ×1 IMPLANT
KIT TURNOVER KIT B (KITS) ×1 IMPLANT
MANIFOLD NEPTUNE II (INSTRUMENTS) ×1 IMPLANT
NDL HYPO 25GX1X1/2 BEV (NEEDLE) IMPLANT
NEEDLE HYPO 25GX1X1/2 BEV (NEEDLE)
NS IRRIG 1000ML POUR BTL (IV SOLUTION) ×1 IMPLANT
PACK ORTHO EXTREMITY (CUSTOM PROCEDURE TRAY) ×1 IMPLANT
PAD ARMBOARD 7.5X6 YLW CONV (MISCELLANEOUS) ×2 IMPLANT
PAD CAST 4YDX4 CTTN HI CHSV (CAST SUPPLIES) IMPLANT
SPECIMEN JAR SMALL (MISCELLANEOUS) ×1 IMPLANT
SPLINT FIBERGLASS 3X35 (CAST SUPPLIES) IMPLANT
SUCTION TUBE FRAZIER 10FR DISP (SUCTIONS) IMPLANT
SUT ETHILON 2 0 FS 18 (SUTURE) IMPLANT
SUT ETHILON 2 0 PSLX (SUTURE) IMPLANT
SUT VIC AB 2-0 FS1 27 (SUTURE) IMPLANT
SYR CONTROL 10ML LL (SYRINGE) IMPLANT
TOWEL GREEN STERILE (TOWEL DISPOSABLE) ×1 IMPLANT
TOWEL GREEN STERILE FF (TOWEL DISPOSABLE) ×1 IMPLANT
TUBE CONNECTING 12X1/4 (SUCTIONS) IMPLANT
WATER STERILE IRR 1000ML POUR (IV SOLUTION) ×1 IMPLANT

## 2023-07-01 NOTE — Addendum Note (Signed)
 Addendum  created 07/01/23 1133 by Jana Mcgregor, CRNA   Attestation recorded in [place], Flowsheet accepted, [employer] filed

## 2023-07-01 NOTE — Anesthesia Postprocedure Evaluation (Signed)
 Anesthesia Post Note  Patient: Brendan Hopkins  Procedure(s) Performed: EXCISION LEFT ELBOW OLECRANON (ELBOW) BURSA (Left: Elbow)     Patient location during evaluation: PACU Anesthesia Type: General Level of consciousness: awake and alert Pain management: pain level controlled Vital Signs Assessment: post-procedure vital signs reviewed and stable Respiratory status: spontaneous breathing, nonlabored ventilation and respiratory function stable Cardiovascular status: blood pressure returned to baseline and stable Postop Assessment: no apparent nausea or vomiting Anesthetic complications: no  No notable events documented.  Last Vitals:  Vitals:   07/01/23 1045 07/01/23 1053  BP: (!) 157/79 (!) 151/85  Pulse: 69 68  Resp: 13 18  Temp:  36.6 C  SpO2: 98% 97%    Last Pain:  Vitals:   07/01/23 1053  TempSrc:   PainSc: 5                  Chennel Olivos,W. EDMOND

## 2023-07-01 NOTE — Telephone Encounter (Signed)
 Patient called the triage phone this afternoon reporting when he woke up from lying down for nap he went to use restroom and noticed that blood was pouring from his bandage. His sling was covered in blood. I advised him that we have no providers in the office this afternoon and it would be best if he went to ED for eval to make sure his incision is intact.  S/p exicision of left elbow 07/01/23

## 2023-07-01 NOTE — ED Triage Notes (Addendum)
 Pt from home with reports of bleeding from left elbow. Pt reports having surgery on elbow this morning. Pt bleeding through multiple abd pads. Pt  taking Xarelto .

## 2023-07-01 NOTE — H&P (Signed)
 Brendan Hopkins is an 88 y.o. male.   Chief Complaint: Painful left elbow bursitis HPI: Patient is a 88 year old gentleman who is seen for initial evaluation for olecranon bursitis left elbow. Patient states he has tried conservative options with aspirations without resolution. Patient states it affects his activities of daily living.   Past Medical History:  Diagnosis Date   A-fib [facility])    Acute deep vein thrombosis (DVT) of tibial vein of right lower extremity (HCC)    after ankle fracture/surgery (2/21)   Arthritis    RIGHT HIP, HANDS, BACK   Cancer (HCC)    Skin cancer - face, neck and back   Cataract    bilateral - removed   Cervical radiculopathy    severe C3-4 neuroforaminal stenosis, C5-6 left synovial cyst compressing left nerve root.   Chronic kidney disease    stage 3a   Colon polyps    2005   COPD (chronic obstructive pulmonary disease) (HCC)    SMOKED FOR 41 YRS   Cystoid macular degeneration of both eyes    Diverticulosis    2005   Fatty liver    GERD (gastroesophageal reflux disease)    Hard of hearing    has hearing aids   History of kidney stones    Hypertension    Leg fracture, right    Oxygen  dependent    2L via [place].  Patient wears oxygen  at night and when need during day   Pain    RIGHT HIP AND BACK   Personal history of colonic polyps-adenoma and sessile serrated polyp 07/15/2010   Pneumonia 03/2019   x several   RBBB    HX AFIB IN PAST- PAC'S   Shortness of breath    WITH EXERTION ONLY   Stroke (HCC)    TIA hx   Wears glasses     Past Surgical History:  Procedure Laterality Date   COLONOSCOPY     maybe 3    COLONOSCOPY W/ POLYPECTOMY     CYSTOSCOPY WITH RETROGRADE PYELOGRAM, URETEROSCOPY AND STENT PLACEMENT Left 12/11/2019   Procedure: CYSTOSCOPY WITH LEFT RETROGRADE LEFT  URETEROSCOPY WITH HOLMIUM LASER AND STENT PLACEMENT;  Surgeon: Watt Rush, MD;  Location: [facility];  Service: Urology;  Laterality: Left;   EYE SURGERY Bilateral    x 5    ORIF ANKLE FRACTURE Right 07/11/2019   Procedure: OPEN REDUCTION INTERNAL FIXATION (ORIF) ANKLE FRACTURE;  Surgeon: Kendal Franky SQUIBB, MD;  Location: [facility];  Service: Orthopedics;  Laterality: Right;   skin cancers removed      TOTAL HIP ARTHROPLASTY Right 01/09/2013   Procedure: RIGHT TOTAL HIP ARTHROPLASTY ANTERIOR APPROACH;  Surgeon: Donnice JONETTA Car, MD;  Location: [facility];  Service: Orthopedics;  Laterality: Right;   TOTAL HIP ARTHROPLASTY Left 02/24/2021   Procedure: TOTAL HIP ARTHROPLASTY ANTERIOR APPROACH;  Surgeon: Car Donnice, MD;  Location: [facility];  Service: Orthopedics;  Laterality: Left;    Family History  Problem Relation Age of Onset   Cancer Mother        died more of old age at age 35   Cancer Father    Colon cancer Neg Hx    Esophageal cancer Neg Hx    Prostate cancer Neg Hx    Rectal cancer Neg Hx    Stomach cancer Neg Hx    Social History:  reports that he quit smoking about 16 years ago. His smoking use included cigarettes. He started smoking about 73 years ago. He has a 114  pack-year smoking history. He has never used smokeless tobacco. He reports that he does not drink alcohol and does not use drugs.  Allergies: No Known Allergies  Medications Prior to Admission  Medication Sig Dispense Refill   acetaminophen  (TYLENOL ) 650 MG CR tablet Take 650 mg by mouth 3 (three) times daily as needed for pain.     albuterol  (VENTOLIN  HFA) 108 (90 Base) MCG/ACT inhaler Inhale 1 to 2 puffs by mouth every 6 hours as needed for shortness of breath (Patient taking differently: Inhale 2 puffs into the lungs at bedtime. Additional puff if needed of during the day) 6.7 each 2   amLODipine  (NORVASC ) 5 MG tablet Take 1 tablet by mouth at bedtime 30 tablet 11   Fluticasone -Umeclidin-Vilant (TRELEGY ELLIPTA ) 200-62.5-25 MCG/ACT AEPB Inhale 1 puff into the lungs daily. 60 each 5   furosemide  (LASIX ) 40 MG tablet Take 1 tablet by mouth once daily 30 tablet 11   gabapentin  (NEURONTIN ) 300  MG capsule Take 1 capsule (300 mg total) by mouth 3 (three) times daily. TAKE 1 CAPSULE BY MOUTH THREE TIMES DAILY AS NEEDED FOR  SCIATICA 270 capsule 1   Iron , Ferrous Sulfate , 325 (65 Fe) MG TABS Take 325 mg by mouth daily. 30 tablet 1   meclizine  (ANTIVERT ) 25 MG tablet TAKE 1 TABLET BY MOUTH THREE TIMES DAILY AS NEEDED FOR DIZZINESS (Patient taking differently: Take 25 mg by mouth daily.) 30 tablet 0   metoprolol  succinate (TOPROL -XL) 25 MG 24 hr tablet Take 1 tablet by mouth once daily 90 tablet 0   Multiple Vitamin (MULTIVITAMIN WITH MINERALS) TABS Take 1 tablet by mouth daily.     Omega-3 Fatty Acids (FISH OIL) 1200 MG CAPS Take 1,200 mg by mouth every evening.     OXYGEN  Inhale 2 L into the lungs daily as needed (Shortness of breath).     pantoprazole  (PROTONIX ) 40 MG tablet Take 1 tablet by mouth every day 30 tablet 11   rivaroxaban  (XARELTO ) 20 MG TABS tablet Take 1 tablet (20 mg total) by mouth daily. 90 tablet 1   Specialty Vitamins Products (MEMORY COMPLEX BRAIN HEALTH PO) Take 1 tablet by mouth daily.     zolpidem  (AMBIEN ) 10 MG tablet Take 1 tablet (10 mg total) by mouth at bedtime as needed. for sleep (Patient taking differently: Take 10 mg by mouth at bedtime. for sleep) 30 tablet 5   colchicine  0.6 MG tablet Take 1 tablet (0.6 mg total) by mouth daily. 30 tablet 2    No results found for this or any previous visit (from the past 48 hours). No results found.  Review of Systems  All other systems reviewed and are negative.   Height 5' 11 (1.803 m), weight 115.4 kg. Physical Exam  Patient is alert, oriented, no adenopathy, well-dressed, normal affect, normal respiratory effort. Examination patient has good range of motion of the left elbow full active extension.  He has a fluid-filled olecranon bursa that is nontender to palpation there is no cellulitis no drainage.  The bursa is 8 cm in diameter. Assessment/Plan 1. Pain in left elbow   2. Olecranon bursitis, left elbow        Plan: Will plan for surgical excision of the olecranon bursa as an outpatient.  Risk and benefits were discussed including infection bleeding recurrence of the bursa.  Patient states he understands wished to proceed at this time.  Jerona LULLA Sage, MD 07/01/2023, 6:45 AM

## 2023-07-01 NOTE — ED Notes (Signed)
 Assumed pt care.

## 2023-07-01 NOTE — Op Note (Addendum)
 07/01/2023  9:56 AM  PATIENT:  Brendan Hopkins    PRE-OPERATIVE DIAGNOSIS:  Olecranon Bursitis Left Elbow  POST-OPERATIVE DIAGNOSIS:  Same  PROCEDURE:  EXCISION LEFT ELBOW OLECRANON (ELBOW) BURSA Local tissue transfer for wound closure 10 x 3 cm.  SURGEON:  Jerona LULLA Sage, MD  PHYSICIAN ASSISTANT: Magdalene Fireman ANESTHESIA:   General  PREOPERATIVE INDICATIONS:  Brendan Hopkins is a  88 y.o. male with a diagnosis of Olecranon Bursitis Left Elbow who failed conservative measures and elected for surgical management.    The risks benefits and alternatives were discussed with the patient preoperatively including but not limited to the risks of infection, bleeding, nerve injury, cardiopulmonary complications, the need for revision surgery, among others, and the patient was willing to proceed.  OPERATIVE IMPLANTS:   * No implants in log *  @ENCIMAGES @  OPERATIVE FINDINGS: Clear bursal fluid with multiple small loose bodies with the appearance of grains of rice.  Tissue was sent for pathology.  OPERATIVE PROCEDURE: Patient was brought the operating room and underwent a general anesthetic.  After adequate levels anesthesia obtained patient's left upper extremity was prepped using DuraPrep draped into a sterile field a timeout was called.  Elliptical incision was made over the olecranon bursa.  This was carried down to the bursa and the bursa was resected in 1 block of tissue.  The tissue within the bursa had clear bursal fluid and multiple small grains of loose bodies that appear to have the consistency of rice.  Electrocautery was used hemostasis.  The wound was irrigated with Vashe.  The wound edges were undermined for allow for local tissue transfer for wound closure.  Local tissue rearrangement was used to close the wound 10 x 3 cm.  Sterile dressing was applied with a posterior splint patient was extubated taken the PACU in stable condition.   DISCHARGE PLANNING:  Antibiotic duration:  Preoperative antibiotics  Weightbearing: Not applicable  Pain medication: Prescription for Percocet  Dressing care/ Wound VAC: Dry dressing and splint change and follow-up in the office.  Ambulatory devices: Not applicable  Discharge to: Home.  Follow-up: In the office 1 week post operative.

## 2023-07-01 NOTE — Progress Notes (Signed)
 Orthopedic Tech Progress Note Patient Details:  Brendan Hopkins [birth year] [id number]  Ortho Devices Type of Ortho Device: Long arm splint Ortho Device/Splint Location: LUE/brace with flexion Ortho Device/Splint Interventions: Ordered, Application, Adjustment   Post Interventions Patient Tolerated: Well Instructions Provided: Care of device  [name] 07/01/2023, 8:09 PM

## 2023-07-01 NOTE — Transfer of Care (Signed)
 Immediate Anesthesia Transfer of Care Note  Patient: Brendan Hopkins  Procedure(s) Performed: EXCISION LEFT ELBOW OLECRANON (ELBOW) BURSA (Left: Elbow)  Patient Location: PACU  Anesthesia Type:General  Level of Consciousness: awake, alert , and oriented  Airway & Oxygen  Therapy: Patient Spontanous Breathing  Post-op Assessment: Report given to RN  Post vital signs: Reviewed and stable  Last Vitals:  Vitals Value Taken Time  BP 153/87 07/01/23 1009  Temp 36.6 C 07/01/23 1009  Pulse 78 07/01/23 1009  Resp 18 07/01/23 1009  SpO2 90 % 07/01/23 1009    Last Pain:  Vitals:   07/01/23 0657  TempSrc: Oral  PainSc: 0-No pain         Complications: No notable events documented.

## 2023-07-01 NOTE — ED Notes (Signed)
 Combat gauze applied at this time and Ortho tech at bedside placing splint at this time.

## 2023-07-01 NOTE — Discharge Instructions (Signed)
 You had bleeding from your surgical wound today but after dressing and splinting the bleeding has improved.  I spoke to orthopedics who feels you are safe for discharge.  Please follow-up with them in clinic and if any symptoms change or worsen acutely, return to the nearest emergency department.  Please give it elevated and try not to bend it.

## 2023-07-01 NOTE — Interval H&P Note (Signed)
 History and Physical Interval Note:  07/01/2023 7:58 AM  Brendan Hopkins  has presented today for surgery, with the diagnosis of Olecranon Bursitis Left Elbow.  The various methods of treatment have been discussed with the patient and family. After consideration of risks, benefits and other options for treatment, the patient has consented to  Procedure(s): EXCISION LEFT ELBOW OLECRANON (ELBOW) BURSA (Left) as a surgical intervention.  The patient's history has been reviewed, patient examined, no change in status, stable for surgery.  I have reviewed the patient's chart and labs.  Questions were answered to the patient's satisfaction.     Lexa Coronado V Zakee Deerman

## 2023-07-01 NOTE — ED Provider Notes (Signed)
 [place] EMERGENCY DEPARTMENT AT [facility] Provider Note   CSN: [id number] Arrival date & time: 07/01/23  1529     History  Chief Complaint  Patient presents with   Post-op Problem    Brendan Hopkins is a 88 y.o. male.  The history is provided by the patient and medical records. No language interpreter was used.  Wound Check This is a new problem. The current episode started 6 to 12 hours ago. The problem occurs constantly. The problem has not changed since onset.Pertinent negatives include no chest pain, no abdominal pain, no headaches and no shortness of breath. Nothing aggravates the symptoms. Nothing relieves the symptoms. He has tried nothing for the symptoms. The treatment provided no relief.       Home Medications Prior to Admission medications   Medication Sig Start Date End Date Taking? Authorizing Provider  acetaminophen  (TYLENOL ) 650 MG CR tablet Take 650 mg by mouth 3 (three) times daily as needed for pain.    [provider]  albuterol  (VENTOLIN  HFA) 108 (90 Base) MCG/ACT inhaler Inhale 1 to 2 puffs by mouth every 6 hours as needed for shortness of breath Patient taking differently: Inhale 2 puffs into the lungs at bedtime. Additional puff if needed of during the day 06/07/23   Duanne Butler DASEN, MD  amLODipine  (NORVASC ) 5 MG tablet Take 1 tablet by mouth at bedtime 03/30/23   Duanne Butler DASEN, MD  colchicine  0.6 MG tablet Take 1 tablet (0.6 mg total) by mouth daily. 06/30/23   Harden Jerona GAILS, MD  Fluticasone -Umeclidin-Vilant (TRELEGY ELLIPTA ) 200-62.5-25 MCG/ACT AEPB Inhale 1 puff into the lungs daily. 06/09/23   Kassie Acquanetta Bradley, MD  furosemide  (LASIX ) 40 MG tablet Take 1 tablet by mouth once daily 05/30/23   Duanne Butler DASEN, MD  gabapentin  (NEURONTIN ) 300 MG capsule Take 1 capsule (300 mg total) by mouth 3 (three) times daily. TAKE 1 CAPSULE BY MOUTH THREE TIMES DAILY AS NEEDED FOR  SCIATICA 03/17/23   Duanne Butler DASEN, MD  Iron , Ferrous  Sulfate, 325 (65 Fe) MG TABS Take 325 mg by mouth daily. 08/05/22   Duanne Butler DASEN, MD  meclizine  (ANTIVERT ) 25 MG tablet TAKE 1 TABLET BY MOUTH THREE TIMES DAILY AS NEEDED FOR DIZZINESS Patient taking differently: Take 25 mg by mouth daily. 06/16/23   Duanne Butler DASEN, MD  metoprolol  succinate (TOPROL -XL) 25 MG 24 hr tablet Take 1 tablet by mouth once daily 04/13/23   Duanne Butler DASEN, MD  Multiple Vitamin (MULTIVITAMIN WITH MINERALS) TABS Take 1 tablet by mouth daily.    [provider]  Omega-3 Fatty Acids (FISH OIL) 1200 MG CAPS Take 1,200 mg by mouth every evening.    [provider]  oxyCODONE -acetaminophen  (PERCOCET/ROXICET) 5-325 MG tablet Take 1 tablet by mouth every 4 (four) hours as needed. 07/01/23   Harden Jerona GAILS, MD  OXYGEN  Inhale 2 L into the lungs daily as needed (Shortness of breath).    [provider]  pantoprazole  (PROTONIX ) 40 MG tablet Take 1 tablet by mouth every day 04/05/23   Duanne Butler DASEN, MD  rivaroxaban  (XARELTO ) 20 MG TABS tablet Take 1 tablet (20 mg total) by mouth daily. 11/15/22   Duanne Butler DASEN, MD  Specialty Vitamins Products (MEMORY COMPLEX BRAIN HEALTH PO) Take 1 tablet by mouth daily.    [provider]  zolpidem  (AMBIEN ) 10 MG tablet Take 1 tablet (10 mg total) by mouth at bedtime as needed. for sleep Patient taking differently: Take  10 mg by mouth at bedtime. for sleep 05/09/23   Duanne Butler DASEN, MD      Allergies    Patient has no known allergies.    Review of Systems   Review of Systems  Constitutional:  Negative for chills, fatigue and fever.  HENT:  Negative for congestion.   Respiratory:  Negative for cough, chest tightness, shortness of breath and wheezing.   Cardiovascular:  Negative for chest pain, palpitations and leg swelling.  Gastrointestinal:  Negative for abdominal pain, constipation, diarrhea, nausea and vomiting.  Genitourinary:  Negative for dysuria.  Musculoskeletal:  Negative for back  pain and neck pain.  Skin:  Positive for wound.  Neurological:  Negative for weakness, light-headedness and headaches.  Psychiatric/Behavioral:  Negative for agitation.   All other systems reviewed and are negative.   Physical Exam Updated Vital Signs BP 127/86 (BP Location: Right Arm)   Pulse (!) 108   Temp 98.5 F (36.9 C) (Oral)   Resp 15   SpO2 91%  Physical Exam Vitals and nursing note reviewed.  Constitutional:      General: He is not in acute distress.    Appearance: He is well-developed. He is not ill-appearing.  HENT:     Head: Normocephalic and atraumatic.  Eyes:     Conjunctiva/sclera: Conjunctivae normal.  Cardiovascular:     Rate and Rhythm: Normal rate and regular rhythm.     Heart sounds: No murmur heard. Pulmonary:     Effort: Pulmonary effort is normal. No respiratory distress.     Breath sounds: Normal breath sounds. No wheezing, rhonchi or rales.  Chest:     Chest wall: No tenderness.  Abdominal:     Palpations: Abdomen is soft.     Tenderness: There is no abdominal tenderness.  Musculoskeletal:        General: Tenderness present. No swelling.     Right elbow: Swelling present. Lacerations: surgical wound presenmt.Tenderness present.     Cervical back: Neck supple.     Comments: Patient has surgical wound on left elbow that had some oozing of bleeding.  No pulsatile bleeding or bright bleeding seen.  Distally he had intact sensation, strength, and pulses.  Surgical wound appears intact.  No crepitance or significant fluctuance.  Skin:    General: Skin is warm and dry.     Capillary Refill: Capillary refill takes less than 2 seconds.     Findings: Bruising present. No erythema.  Neurological:     General: No focal deficit present.     Mental Status: He is alert.  Psychiatric:        Mood and Affect: Mood normal.     ED Results / Procedures / Treatments   Labs (all labs ordered are listed, but only abnormal results are displayed) Labs Reviewed   CBC WITH DIFFERENTIAL/PLATELET - Abnormal; Notable for the following components:      Result Value   RBC 4.16 (*)    All other components within normal limits    EKG None  Radiology No results found.  Procedures Procedures    Medications Ordered in ED Medications - No data to display  ED Course/ Medical Decision Making/ A&P                                 Medical Decision Making   Brendan Hopkins is a 88 y.o. male with a past medical history significant for DVT and  atrial fibrillation on Xarelto  therapy, previous stroke, kidney stones, previous skin cancer, and CKD who just had elbow surgery earlier today with Dr. Harden who presents with bleeding.  According to patient, he had surgery this morning and while at home, noticed that he had bleeding that had soaked through his bandage, dressing, and splint.  He denied any complaints of symptomatic anemia with no chest pain, palpitations, shortness of breath or lightheadedness.  He called the orthopedics team who told him to come to the emergency department for wound management and evaluation.  Otherwise he denies new pain or any other symptoms.  On my exam, the splint had fallen off and was covered in blood.  By the time I saw the patient, the bleeding had significantly slowed and had a small mild oozing if any active bleeding.  Distally he had good sensation, strength, and pulses.  I cleaned off the wound and there was no significant bleeding at this time.  Suspect oozing due to his blood thinner use.  He was told to continue on his blood thinners.  Otherwise lungs clear and chest nontender.  Abdomen nontender.  Patient well-appearing.  Good grip strength as well.  I called and spoke to Dr. Harden who recommended using combat gauze and wrapping of the wound and then resplinting with slight flexion.  We had a hemoglobin checked in triage and was normal.  Will have the wound dressed resplinted and will plan for discharge home for Ortho  follow-up.  Patient agrees and will keep it elevated.  Anticipate discharge after wound management.  8:37 PM Patient's wound was bandaged with the combat gauze and splinted and there is no further bleeding.  Patient would like to go home.  Patient was observed with no further bleeding.  Will discharge per plan with orthopedics.  Patient will call for follow-up and keep it elevated.          Final Clinical Impression(s) / ED Diagnoses Final diagnoses:  Visit for wound check    Rx / DC Orders ED Discharge Orders     None       Clinical Impression: 1. Visit for wound check     Disposition: Discharge  Condition: Good  I have discussed the results, Dx and Tx plan with the pt(& family if present). He/she/they expressed understanding and agree(s) with the plan. Discharge instructions discussed at great length. Strict return precautions discussed and pt &/or family have verbalized understanding of the instructions. No further questions at time of discharge.    New Prescriptions   No medications on file    Follow Up: Harden Jerona GAILS, MD [address] [place] [address] [phone]     [facility] Emergency Department at [facility] [address] [place]  [address] [phone]        Iolani Twilley, Lonni PARAS, MD 07/01/23 2039

## 2023-07-02 ENCOUNTER — Encounter (HOSPITAL_COMMUNITY): Payer: Self-pay | Admitting: Orthopedic Surgery

## 2023-07-05 ENCOUNTER — Ambulatory Visit: Payer: Medicare Other | Admitting: Family

## 2023-07-05 ENCOUNTER — Encounter: Payer: Self-pay | Admitting: Family

## 2023-07-05 DIAGNOSIS — M7022 Olecranon bursitis, left elbow: Secondary | ICD-10-CM

## 2023-07-05 DIAGNOSIS — Z7901 Long term (current) use of anticoagulants: Secondary | ICD-10-CM

## 2023-07-05 LAB — SURGICAL PATHOLOGY
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
## 2023-07-07 ENCOUNTER — Ambulatory Visit: Payer: Medicare Other | Admitting: Orthopedic Surgery

## 2023-07-07 DIAGNOSIS — M7022 Olecranon bursitis, left elbow: Secondary | ICD-10-CM

## 2023-07-07 DIAGNOSIS — Z7901 Long term (current) use of anticoagulants: Secondary | ICD-10-CM

## 2023-07-08 ENCOUNTER — Encounter: Payer: Self-pay | Admitting: Orthopedic Surgery
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
## 2023-07-11 ENCOUNTER — Encounter: Payer: Self-pay | Admitting: Orthopedic Surgery

## 2023-07-11 ENCOUNTER — Ambulatory Visit (INDEPENDENT_AMBULATORY_CARE_PROVIDER_SITE_OTHER): Payer: Medicare Other | Admitting: Orthopedic Surgery

## 2023-07-11 DIAGNOSIS — M7022 Olecranon bursitis, left elbow: Secondary | ICD-10-CM

## 2023-07-11 DIAGNOSIS — Z7901 Long term (current) use of anticoagulants: Secondary | ICD-10-CM

## 2023-07-11 DIAGNOSIS — S51002A Unspecified open wound of left elbow, initial encounter: Secondary | ICD-10-CM
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
## 2023-07-12 ENCOUNTER — Other Ambulatory Visit: Payer: Self-pay | Admitting: Family Medicine

## 2023-07-14 ENCOUNTER — Ambulatory Visit (INDEPENDENT_AMBULATORY_CARE_PROVIDER_SITE_OTHER): Payer: Medicare Other | Admitting: Orthopedic Surgery

## 2023-07-14 ENCOUNTER — Encounter: Payer: Self-pay | Admitting: Orthopedic Surgery

## 2023-07-14 DIAGNOSIS — Z7901 Long term (current) use of anticoagulants: Secondary | ICD-10-CM

## 2023-07-14 DIAGNOSIS — M7022 Olecranon bursitis, left elbow: Secondary | ICD-10-CM

## 2023-07-14 DIAGNOSIS — S51002A Unspecified open wound of left elbow, initial encounter: Secondary | ICD-10-CM
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
## 2023-07-15 ENCOUNTER — Telehealth: Payer: Self-pay

## 2023-07-15 NOTE — Telephone Encounter (Signed)
Patient states the keracis came off his wound when changing guaze He wants to know what you need him to do? What to cover? Put that back? Autumn/Brittany/Erin all off this afternoon and I am not sure what to tell him to do? Can you give him a quick call?

## 2023-07-18 ENCOUNTER — Ambulatory Visit (INDEPENDENT_AMBULATORY_CARE_PROVIDER_SITE_OTHER): Payer: Medicare Other | Admitting: Orthopedic Surgery

## 2023-07-18 ENCOUNTER — Encounter: Payer: Self-pay | Admitting: Orthopedic Surgery

## 2023-07-18 DIAGNOSIS — S51002A Unspecified open wound of left elbow, initial encounter: Secondary | ICD-10-CM

## 2023-07-18 DIAGNOSIS — Z7901 Long term (current) use of anticoagulants: Secondary | ICD-10-CM

## 2023-07-18 DIAGNOSIS — M7022 Olecranon bursitis, left elbow: Secondary | ICD-10-CM
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
## 2023-07-18 NOTE — Telephone Encounter (Signed)
Note, thank you 

## 2023-07-19 ENCOUNTER — Encounter (HOSPITAL_COMMUNITY): Payer: Self-pay | Admitting: Orthopedic Surgery

## 2023-07-19 ENCOUNTER — Other Ambulatory Visit: Payer: Self-pay

## 2023-07-19 NOTE — Anesthesia Preprocedure Evaluation (Signed)
 Anesthesia Evaluation  Patient identified by MRN, date of birth, ID band Patient awake    Reviewed: Allergy & Precautions, H&P , NPO status , Patient's Chart, lab work & pertinent test results  Airway Mallampati: II  TM Distance: >3 FB Neck ROM: Full    Dental no notable dental hx.    Pulmonary COPD, former smoker   Pulmonary exam normal breath sounds clear to auscultation       Cardiovascular hypertension, Normal cardiovascular exam+ dysrhythmias Atrial Fibrillation  Rhythm:Regular Rate:Normal  Hx of dvt   Neuro/Psych Cervical radiculopathy CVA  negative psych ROS   GI/Hepatic Neg liver ROS,GERD  ,,  Endo/Other  negative endocrine ROS    Renal/GU Renal disease  negative genitourinary   Musculoskeletal negative musculoskeletal ROS (+)    Abdominal   Peds negative pediatric ROS (+)  Hematology  (+) Blood dyscrasia, anemia   Anesthesia Other Findings   Reproductive/Obstetrics negative OB ROS                             Anesthesia Physical Anesthesia Plan  ASA: 3  Anesthesia Plan: General   Post-op Pain Management:    Induction: Intravenous  PONV Risk Score and Plan: Ondansetron and Dexamethasone  Airway Management Planned: LMA  Additional Equipment:   Intra-op Plan:   Post-operative Plan: Extubation in OR  Informed Consent: I have reviewed the patients History and Physical, chart, labs and discussed the procedure including the risks, benefits and alternatives for the proposed anesthesia with the patient or authorized representative who has indicated his/her understanding and acceptance.     Dental advisory given  Plan Discussed with: CRNA  Anesthesia Plan Comments: (PAT note written 07/19/2023 by Shonna Chock, PA-C.  See my previous Anesthesia APP note from Date of Service 06/30/2023. He is s/p excision of left elbow olecranon bursa on 07/01/23. He is on Xarelto for PAF  and also with history of post-operative RLE DVT in 2021. He was discharged home following left elbow bursa excision but returned to the ED that evening after the dressing and splint fell off and was covered in blood. Bleeding had significantly improved by the time he was evaluated in the ED. Neurovascular exam intact. H/H 13.3/40.4 (preoperatively was 13.8/41.2). Dr. Lajoyce Corners notified. Incision redressed and resplinted. He has had serial follow-up with Dr. Lajoyce Corners since then. Xarelto held for a week for serosanguineous drainage and areas of skin breakdown. Hematoma debrided on 07/11/23 with underlying 4 cm x 1 cm wound. Wound packing initiated. AT 07/14/23 visit he wished to enroll in Isocod study. Kerecis micro graft applied. At 07/18/23 visit, he reported biologic tissue graft accidentally removed with one of his home dressing changes. Planned debridement with local tissue rearrangement for wound closure and splinting of the elbow in the OR on 07/20/23.     History includes former smoker (quit 2009), HTN, COPD (home O2), PAF (August 2023, started on Xarelto & b-blocker), exertional dyspnea, fatty liver, GERD, CKD, TIA, osteoarthritis (right THA 01/09/13; left THA 02/24/21), DVT (RLE DVT 08/03/19 in setting post-op ORIF right trimalleolar fracture 07/11/19, s/p Xarelto x 6 months), skin cancer, gout.   Last visit with pulmonologist Dr. [name] was on 06/09/23. Feeling well at that time, on Trelegy. Dyspnea unchanged. Coughing throughout the day but better after able to expel excretions. Albuterol before bedtime recommended to help with nighttime coughing. He completed pulmonary rehab in Fall 2024.  Counseled on compliance with home O2.  She recommended wearing  pulsed 3 L O2 with activity and continuous 3 L with sleep.   Last cardiology visit was on 04/27/23 with Azalee Course, PA. In SR at that time with PACs. Continue metoprolol. Elevate legs and use compression stockings as able for LE edema. Echo 03/24/23 showed the EF 60 to  65%, severe concentric LVH, grade 1 diastolic dysfunction, normal RV systolic function, mild RVH, trivial MR, mild aortic stenosis.  Normal/low risk stress test 07/23/2021.  4 months cardiology follow-up planned at last visit.   )       Anesthesia Quick Evaluation

## 2023-07-19 NOTE — Progress Notes (Signed)
 Anesthesia Chart Review: Brendan Hopkins  Case: [id number] Date/Time: 07/20/23 1201   Procedure: LEFT ELBOW DEBRIDEMENT AND CLOSURE (Left)   Anesthesia type: Choice   Pre-op diagnosis: Left Elbow Wound Dehiscence   [facility] RO[facility] / [facility]   Surgeons: Nadara Mustard, MD       DISCUSSION: See my previous Anesthesia APP note from Date of Service 06/30/2023. He is s/p excision of left elbow olecranon bursa on 07/01/23. He is on Xarelto for PAF and also with history of post-operative RLE DVT in 2021. He was discharged home following left elbow bursa excision but returned to the ED that evening after the dressing and splint fell off and was covered in blood. Bleeding had significantly improved by the time he was evaluated in the ED. Neurovascular exam intact. H/H 13.3/40.4 (preoperatively was 13.8/41.2). Dr. Lajoyce Corners notified. Incision redressed and resplinted. He has had serial follow-up with Dr. Lajoyce Corners since then. Xarelto held for a week for serosanguineous drainage and areas of skin breakdown. Hematoma debrided on 07/11/23 with underlying 4 cm x 1 cm wound. Wound packing initiated. AT 07/14/23 visit he wished to enroll in Isocod study. Kerecis micro graft applied. At 07/18/23 visit, he reported biologic tissue graft accidentally removed with one of his home dressing changes. Planned debridement with local tissue rearrangement for wound closure and splinting of the elbow in the OR on 07/20/23.    History includes former smoker (quit 2009), HTN, COPD (home O2), PAF (August 2023, started on Xarelto & b-blocker), exertional dyspnea, fatty liver, GERD, CKD, TIA, osteoarthritis (right THA 01/09/13; left THA 02/24/21), DVT (RLE DVT 08/03/19 in setting post-op ORIF right trimalleolar fracture 07/11/19, s/p Xarelto x 6 months), skin cancer, gout.   Last visit with pulmonologist Dr. [name] was on 06/09/23. Feeling well at that time, on Trelegy. Dyspnea unchanged. Coughing throughout the day but better after able to expel  excretions. Albuterol before bedtime recommended to help with nighttime coughing. He completed pulmonary rehab in Fall 2024.  Counseled on compliance with home O2.  She recommended wearing pulsed 3 L O2 with activity and continuous 3 L with sleep.   Last cardiology visit was on 04/27/23 with Azalee Course, PA. In SR at that time with PACs. Continue metoprolol. Elevate legs and use compression stockings as able for LE edema. Echo 03/24/23 showed the EF 60 to 65%, severe concentric LVH, grade 1 diastolic dysfunction, normal RV systolic function, mild RVH, trivial MR, mild aortic stenosis.  Normal/low risk stress test 07/23/2021.  4 months cardiology follow-up planned at last visit.    Anesthesia team to evaluate on the day of surgery. He reported Xarelto has still been on hold.    PROVIDERS: Donita Brooks, MD is PCP  Luciano Cutter, MD is pulmonologist Olga Millers, MD is cardiologist   LABS: Most recent lab results include: Lab Results  Component Value Date   WBC 5.1 07/01/2023   HGB 13.3 07/01/2023   HCT 40.4 07/01/2023   PLT 252 07/01/2023   GLUCOSE 118 (H) 07/01/2023   ALT 17 08/27/2022   AST 25 08/27/2022   NA 138 07/01/2023   K 4.2 07/01/2023   CL 104 07/01/2023   CREATININE 1.15 07/01/2023   BUN 18 07/01/2023   CO2 23 07/01/2023   TSH 2.675 07/03/2022    IMAGES: CT Chest 10/08/22: IMPRESSION: 1. Mild atelectasis at the lung bases. No acute abnormality is seen. 2. 6 mm pulmonary nodules in the right lung, unchanged from the previous exam.  If the nodules are stable at time of repeat CT, then future CT at 18-24 months (from today's scan) is considered optional for low-risk patients, but is recommended for high-risk patients. This recommendation follows the consensus statement: Guidelines for Management of Incidental Pulmonary Nodules Detected on CT Images: From the Fleischner Society 2017; Radiology 2017; 284:228-243. 3. Severe emphysema. 4. Aortic atherosclerosis and  coronary artery calcifications.     EKG: Sinus rhythm with Premature supraventricular complexes Right bundle branch block Left anterior fascicular block Bifascicular block When compared with ECG of 01-Mar-2023 09:23, Premature supraventricular complexes are now Present Confirmed by Azalee Course [phone]) on 04/27/2023 12:51:03 PM     CV: Echo 03/24/23: IMPRESSIONS   1. Left ventricular ejection fraction, by estimation, is 60 to 65%. The  left ventricle has normal function. There is severe concentric left  ventricular hypertrophy. Left ventricular diastolic parameters are  consistent with Grade I diastolic dysfunction  (impaired relaxation).   2. Right ventricular systolic function is normal. The right ventricular  size is mildly enlarged.   3. Left atrial size was mild to moderately dilated.   4. Trivial mitral valve regurgitation.   5. AV is thickened, calcified with restricted motion. Peak and mean  gradients are 31 and 15 mm Hg respectively. AVA (VTI) is 1.91 cm2.  Dimensionless index is 0.42 Overall consistent with mild aortic stenosis.  Compared to echo from 2022, gradients are  increased. . The aortic valve is tricuspid. Aortic valve regurgitation is  mild.     Nuclear stress test 07/23/21:   The study is normal. The study is low risk.   No ST deviation was noted.   LV perfusion is normal.   Left ventricular function is normal. Nuclear stress EF: 60 %. The left ventricular ejection fraction is normal (55-65%). End diastolic cavity size is normal.   Prior study not available for comparison.    US Carotid 12/02/11: Summary:  Bilateral: mild soft palque origin ICA. No significant ICA  stenosis. Vertebral artery flow is antegrade.    Past Medical History:  Diagnosis Date   A-fib [facility])    Acute deep vein thrombosis (DVT) of tibial vein of right lower extremity (HCC)    after ankle fracture/surgery (2/21)   Arthritis    RIGHT HIP, HANDS, BACK   Cancer (HCC)    Skin  cancer - face, neck and back   Cataract    bilateral - removed   Cervical radiculopathy    severe C3-4 neuroforaminal stenosis, C5-6 left synovial cyst compressing left nerve root.   Chronic kidney disease    stage 3a   Colon polyps    2005   COPD (chronic obstructive pulmonary disease) (HCC)    SMOKED FOR 69 YRS   Cystoid macular degeneration of both eyes    Diverticulosis    2005   Fatty liver    GERD (gastroesophageal reflux disease)    Hard of hearing    has hearing aids   History of kidney stones    Hypertension    Leg fracture, right    Oxygen dependent    2L via [place].  Patient wears oxygen at night and when need during day   Pain    RIGHT HIP AND BACK   Personal history of colonic polyps-adenoma and sessile serrated polyp 07/15/2010   Pneumonia 03/2019   x several   RBBB    HX AFIB IN PAST- PAC'S   Shortness of breath    WITH EXERTION ONLY   Stroke (  HCC)    TIA hx   Wears glasses     Past Surgical History:  Procedure Laterality Date   COLONOSCOPY     maybe 3    COLONOSCOPY W/ POLYPECTOMY     CYSTOSCOPY WITH RETROGRADE PYELOGRAM, URETEROSCOPY AND STENT PLACEMENT Left 12/11/2019   Procedure: CYSTOSCOPY WITH LEFT RETROGRADE LEFT  URETEROSCOPY WITH HOLMIUM LASER AND STENT PLACEMENT;  Surgeon: Bjorn Pippin, MD;  Location: [facility] ORS;  Service: Urology;  Laterality: Left;   EYE SURGERY Bilateral    x 5   OLECRANON BURSECTOMY Left 07/01/2023   Procedure: EXCISION LEFT ELBOW OLECRANON (ELBOW) BURSA;  Surgeon: Nadara Mustard, MD;  Location: [facility] OR;  Service: Orthopedics;  Laterality: Left;   ORIF ANKLE FRACTURE Right 07/11/2019   Procedure: OPEN REDUCTION INTERNAL FIXATION (ORIF) ANKLE FRACTURE;  Surgeon: Roby Lofts, MD;  Location: [facility] OR;  Service: Orthopedics;  Laterality: Right;   skin cancers removed      TOTAL HIP ARTHROPLASTY Right 01/09/2013   Procedure: RIGHT TOTAL HIP ARTHROPLASTY ANTERIOR APPROACH;  Surgeon: Shelda Pal, MD;  Location: [facility]S;  Service:  Orthopedics;  Laterality: Right;   TOTAL HIP ARTHROPLASTY Left 02/24/2021   Procedure: TOTAL HIP ARTHROPLASTY ANTERIOR APPROACH;  Surgeon: Durene Romans, MD;  Location: [facility];  Service: Orthopedics;  Laterality: Left;    MEDICATIONS: No current facility-administered medications for this encounter.    acetaminophen (TYLENOL) 650 MG CR tablet   albuterol (VENTOLIN HFA) 108 (90 Base) MCG/ACT inhaler   amLODipine (NORVASC) 5 MG tablet   colchicine 0.6 MG tablet   Fluticasone-Umeclidin-Vilant (TRELEGY ELLIPTA) 200-62.5-25 MCG/ACT AEPB   furosemide (LASIX) 40 MG tablet   gabapentin (NEURONTIN) 300 MG capsule   Iron, Ferrous Sulfate, 325 (65 Fe) MG TABS   meclizine (ANTIVERT) 25 MG tablet   metoprolol succinate (TOPROL-XL) 25 MG 24 hr tablet   Multiple Vitamin (MULTIVITAMIN WITH MINERALS) TABS   Omega-3 Fatty Acids (FISH OIL) 1200 MG CAPS   oxyCODONE-acetaminophen (PERCOCET/ROXICET) 5-325 MG tablet   OXYGEN   pantoprazole (PROTONIX) 40 MG tablet   rivaroxaban (XARELTO) 20 MG TABS tablet   Specialty Vitamins Products (MEMORY COMPLEX BRAIN HEALTH PO)   zolpidem (AMBIEN) 10 MG tablet    Shonna Chock, PA-C Surgical Short Stay/Anesthesiology [facility] Phone [phone] [facility] Phone [phone] 07/19/2023 3:34 PM

## 2023-07-19 NOTE — Progress Notes (Addendum)
 SDW CALL  Patient was given pre-op instructions over the phone. The opportunity was given for the patient to ask questions. No further questions asked. Patient verbalized understanding of instructions given.   PCP - Broadus John Pickard,MD Cardiologist - Ripley Fraise  PPM/ICD - denies Device Orders -  Rep Notified -   Chest x-ray - 08/27/22 EKG - 04/27/23 Stress Test - 07/23/21 ECHO - 03/24/23 Cardiac Cath - denies  Sleep Study - denies CPAP -   Fasting Blood Sugar - na Checks Blood Sugar _____ times a day  Blood Thinner Instructions:Pt states he has not taken Xarelto since his last surgery which was on 07/01/23.  I told him he should speak to his cardiologist about holding his Xarelto. He agreed that he would  inform the doctor. Pt states his last dose was February 6. Aspirin Instructions:na  ERAS Protcol -clears until 0915 PRE-SURGERY Ensure or G2-   COVID TEST- na   Anesthesia review: yes - pt on home O2 2liters,HX afib.  Patient denies shortness of breath, fever, cough and chest pain over the phone call    Surgical Instructions    Your procedure is scheduled on February 26  Report to [facility] Main Entrance "A" at 0945 A.M., then check in with the Admitting office.  Call this number if you have problems the morning of surgery:  [phone]    Remember:  Do not eat after midnight the night before your surgery  You may drink clear liquids until 0915am the morning of your surgery.   Clear liquids allowed are: Water, Non-Citrus Juices (without pulp), Carbonated Beverages, Clear Tea, Black Coffee ONLY (NO MILK, CREAM OR POWDERED CREAMER of any kind), and Gatorade   Take these medicines the morning of surgery with A SIP OF WATER: Amlodipine,colchicine,Trelegy Ellipta,Gaba[entin,Meclizine,Metoprolol,Protonix. PRN- Percocet or Tylenol.    As of today, STOP taking any Aspirin (unless otherwise instructed by your surgeon) Aleve, Naproxen, Ibuprofen, Motrin, Advil,  Goody's, BC's, all herbal medications, fish oil, and all vitamins.  [facility] is not responsible for any belongings or valuables. .   Do NOT Smoke (Tobacco/Vaping)  24 hours prior to your procedure  If you use a CPAP at night, you may bring your mask for your overnight stay.   Contacts, glasses, hearing aids, dentures or partials may not be worn into surgery, please bring cases for these belongings   Patients discharged the day of surgery will not be allowed to drive home, and someone needs to stay with them for 24 hours.    Special instructions:    Oral Hygiene is also important to reduce your risk of infection.  Remember - BRUSH YOUR TEETH THE MORNING OF SURGERY WITH YOUR REGULAR TOOTHPASTE   Day of Surgery:  Take a shower the day of or night before with antibacterial soap. Wear Clean/Comfortable clothing the morning of surgery Do not apply any deodorants/lotions.   Do not wear jewelry or makeup Do not wear lotions, powders, perfumes/colognes, or deodorant. Do not shave 48 hours prior to surgery.  Men may shave face and neck. Do not bring valuables to the hospital. Do not wear nail polish, gel polish, artificial nails, or any other type of covering on natural nails (fingers and toes) If you have artificial nails or gel coating that need to be removed by a nail salon, please have this removed prior to surgery. Artificial nails or gel coating may interfere with anesthesia's ability to adequately monitor your vital signs. Remember to brush your teeth WITH YOUR REGULAR  TOOTHPASTE.

## 2023-07-20 ENCOUNTER — Encounter (HOSPITAL_COMMUNITY)
Admission: RE | Disposition: A | Discharge status: Home / Self Care | Payer: Self-pay | Source: Home / Self Care | Attending: Orthopedic Surgery

## 2023-07-20 ENCOUNTER — Ambulatory Visit (HOSPITAL_BASED_OUTPATIENT_CLINIC_OR_DEPARTMENT_OTHER): Payer: Medicare Other | Admitting: Physician Assistant

## 2023-07-20 ENCOUNTER — Observation Stay (HOSPITAL_COMMUNITY)
Admission: RE | Admit: 2023-07-20 | Discharge: 2023-07-21 | Disposition: A | Discharge status: Home / Self Care | Payer: Medicare Other | Attending: Orthopedic Surgery | Admitting: Orthopedic Surgery

## 2023-07-20 ENCOUNTER — Encounter (HOSPITAL_COMMUNITY): Payer: Self-pay | Admitting: Orthopedic Surgery

## 2023-07-20 ENCOUNTER — Ambulatory Visit (HOSPITAL_COMMUNITY): Payer: Medicare Other | Admitting: Physician Assistant

## 2023-07-20 DIAGNOSIS — Z87891 Personal history of nicotine dependence: Secondary | ICD-10-CM | POA: Diagnosis not present

## 2023-07-20 DIAGNOSIS — Z7901 Long term (current) use of anticoagulants: Secondary | ICD-10-CM | POA: Diagnosis not present

## 2023-07-20 DIAGNOSIS — I4891 Unspecified atrial fibrillation: Secondary | ICD-10-CM | POA: Diagnosis not present

## 2023-07-20 DIAGNOSIS — I10 Essential (primary) hypertension: Secondary | ICD-10-CM

## 2023-07-20 DIAGNOSIS — N1831 Chronic kidney disease, stage 3a: Secondary | ICD-10-CM | POA: Insufficient documentation

## 2023-07-20 DIAGNOSIS — S51002A Unspecified open wound of left elbow, initial encounter: Secondary | ICD-10-CM | POA: Diagnosis not present

## 2023-07-20 DIAGNOSIS — Z85828 Personal history of other malignant neoplasm of skin: Secondary | ICD-10-CM | POA: Insufficient documentation

## 2023-07-20 DIAGNOSIS — Y9389 Activity, other specified: Secondary | ICD-10-CM | POA: Diagnosis not present

## 2023-07-20 DIAGNOSIS — Z96643 Presence of artificial hip joint, bilateral: Secondary | ICD-10-CM | POA: Insufficient documentation

## 2023-07-20 DIAGNOSIS — Z86718 Personal history of other venous thrombosis and embolism: Secondary | ICD-10-CM | POA: Diagnosis not present

## 2023-07-20 DIAGNOSIS — J449 Chronic obstructive pulmonary disease, unspecified: Secondary | ICD-10-CM | POA: Insufficient documentation

## 2023-07-20 DIAGNOSIS — M7022 Olecranon bursitis, left elbow: Secondary | ICD-10-CM | POA: Insufficient documentation

## 2023-07-20 DIAGNOSIS — T8131XA Disruption of external operation (surgical) wound, not elsewhere classified, initial encounter: Secondary | ICD-10-CM | POA: Diagnosis not present

## 2023-07-20 DIAGNOSIS — Z8673 Personal history of transient ischemic attack (TIA), and cerebral infarction without residual deficits: Secondary | ICD-10-CM | POA: Insufficient documentation

## 2023-07-20 DIAGNOSIS — Y848 Other medical procedures as the cause of abnormal reaction of the patient, or of later complication, without mention of misadventure at the time of the procedure: Secondary | ICD-10-CM | POA: Insufficient documentation

## 2023-07-20 DIAGNOSIS — I129 Hypertensive chronic kidney disease with stage 1 through stage 4 chronic kidney disease, or unspecified chronic kidney disease: Secondary | ICD-10-CM | POA: Diagnosis not present

## 2023-07-20 DIAGNOSIS — I480 Paroxysmal atrial fibrillation: Secondary | ICD-10-CM | POA: Diagnosis not present

## 2023-07-20 HISTORY — PX: I & D EXTREMITY: SHX5045

## 2023-07-20 SURGERY — IRRIGATION AND DEBRIDEMENT EXTREMITY
Anesthesia: General | Site: Elbow | Laterality: Left

## 2023-07-20 MED ORDER — CHLORHEXIDINE GLUCONATE 0.12 % MT SOLN
15.0000 mL | Freq: Once | OROMUCOSAL | Status: AC
  Administered 2023-07-20: 15 mL via OROMUCOSAL
  Filled 2023-07-20: qty 15

## 2023-07-20 MED ORDER — ALBUTEROL SULFATE HFA 108 (90 BASE) MCG/ACT IN AERS
2.0000 | INHALATION_SPRAY | Freq: Every day | RESPIRATORY_TRACT | Status: DC
  Filled 2023-07-20: qty 6.7

## 2023-07-20 MED ORDER — PROPOFOL 10 MG/ML IV BOLUS
INTRAVENOUS | Status: AC
  Filled 2023-07-20: qty 20

## 2023-07-20 MED ORDER — METOCLOPRAMIDE HCL 5 MG/ML IJ SOLN: 5.0000 mg | Freq: Three times a day (TID) | INTRAMUSCULAR | Status: DC | PRN

## 2023-07-20 MED ORDER — VANCOMYCIN HCL 1000 MG IV SOLR
INTRAVENOUS | Status: DC | PRN
  Administered 2023-07-20: 1000 mg

## 2023-07-20 MED ORDER — OXYCODONE HCL 5 MG PO TABS
5.0000 mg | ORAL_TABLET | ORAL | Status: DC | PRN
Start: 2023-07-20 — End: 2023-07-21
  Administered 2023-07-21: 10 mg via ORAL
  Filled 2023-07-20: qty 2

## 2023-07-20 MED ORDER — OXYCODONE HCL 5 MG/5ML PO SOLN: 5.0000 mg | Freq: Once | ORAL | Status: DC | PRN

## 2023-07-20 MED ORDER — PROPOFOL 10 MG/ML IV BOLUS
INTRAVENOUS | Status: DC | PRN
  Administered 2023-07-20: 40 mg via INTRAVENOUS
  Administered 2023-07-20: 100 mg via INTRAVENOUS

## 2023-07-20 MED ORDER — ACETAMINOPHEN 10 MG/ML IV SOLN
1000.0000 mg | Freq: Once | INTRAVENOUS | Status: DC | PRN
  Administered 2023-07-20: 1000 mg via INTRAVENOUS

## 2023-07-20 MED ORDER — SODIUM CHLORIDE 0.9% FLUSH
3.0000 mL | Freq: Two times a day (BID) | INTRAVENOUS | Status: DC
  Administered 2023-07-20 (×2): 10 mL via INTRAVENOUS
  Administered 2023-07-21: 3 mL via INTRAVENOUS

## 2023-07-20 MED ORDER — AMLODIPINE BESYLATE 5 MG PO TABS: 5.0000 mg | ORAL_TABLET | Freq: Every day | ORAL | Status: DC

## 2023-07-20 MED ORDER — METOCLOPRAMIDE HCL 5 MG PO TABS: 5.0000 mg | ORAL_TABLET | Freq: Three times a day (TID) | ORAL | Status: DC | PRN

## 2023-07-20 MED ORDER — ONDANSETRON HCL 4 MG PO TABS: 4.0000 mg | ORAL_TABLET | Freq: Four times a day (QID) | ORAL | Status: DC | PRN

## 2023-07-20 MED ORDER — PHENYLEPHRINE 80 MCG/ML (10ML) SYRINGE FOR IV PUSH (FOR BLOOD PRESSURE SUPPORT)
PREFILLED_SYRINGE | INTRAVENOUS | Status: DC | PRN
  Administered 2023-07-20: 160 ug via INTRAVENOUS
  Administered 2023-07-20: 80 ug via INTRAVENOUS

## 2023-07-20 MED ORDER — CEFAZOLIN SODIUM-DEXTROSE 2-4 GM/100ML-% IV SOLN
2.0000 g | Freq: Four times a day (QID) | INTRAVENOUS | Status: AC
  Administered 2023-07-20 (×2): 2 g via INTRAVENOUS
  Filled 2023-07-20 (×2): qty 100

## 2023-07-20 MED ORDER — POLYETHYLENE GLYCOL 3350 17 G PO PACK: 17.0000 g | PACK | Freq: Every day | ORAL | Status: DC | PRN

## 2023-07-20 MED ORDER — GABAPENTIN 300 MG PO CAPS
300.0000 mg | ORAL_CAPSULE | Freq: Three times a day (TID) | ORAL | Status: DC
  Administered 2023-07-20 – 2023-07-21 (×3): 300 mg via ORAL
  Filled 2023-07-20 (×3): qty 1

## 2023-07-20 MED ORDER — FENTANYL CITRATE (PF) 100 MCG/2ML IJ SOLN
INTRAMUSCULAR | Status: AC
  Filled 2023-07-20: qty 2

## 2023-07-20 MED ORDER — PANTOPRAZOLE SODIUM 40 MG PO TBEC
40.0000 mg | DELAYED_RELEASE_TABLET | Freq: Every day | ORAL | Status: DC
  Administered 2023-07-21: 40 mg via ORAL
  Filled 2023-07-20: qty 1

## 2023-07-20 MED ORDER — BISACODYL 10 MG RE SUPP: 10.0000 mg | Freq: Every day | RECTAL | Status: DC | PRN

## 2023-07-20 MED ORDER — SODIUM CHLORIDE 0.9% FLUSH: 3.0000 mL | INTRAVENOUS | Status: DC | PRN

## 2023-07-20 MED ORDER — HYDROMORPHONE HCL 1 MG/ML IJ SOLN: 0.5000 mg | INTRAMUSCULAR | Status: DC | PRN

## 2023-07-20 MED ORDER — COLCHICINE 0.6 MG PO TABS
0.6000 mg | ORAL_TABLET | Freq: Every day | ORAL | Status: DC
  Administered 2023-07-21: 0.6 mg via ORAL
  Filled 2023-07-20: qty 1

## 2023-07-20 MED ORDER — METHOCARBAMOL 1000 MG/10ML IJ SOLN: 500.0000 mg | Freq: Four times a day (QID) | INTRAMUSCULAR | Status: DC | PRN

## 2023-07-20 MED ORDER — FENTANYL CITRATE (PF) 100 MCG/2ML IJ SOLN
25.0000 ug | INTRAMUSCULAR | Status: DC | PRN
Start: 2023-07-20 — End: 2023-07-20
  Administered 2023-07-20 (×4): 50 ug via INTRAVENOUS

## 2023-07-20 MED ORDER — ACETAMINOPHEN 10 MG/ML IV SOLN
INTRAVENOUS | Status: AC
  Filled 2023-07-20: qty 100

## 2023-07-20 MED ORDER — ORAL CARE MOUTH RINSE: 15.0000 mL | Freq: Once | OROMUCOSAL | Status: AC

## 2023-07-20 MED ORDER — HYDROMORPHONE HCL 1 MG/ML IJ SOLN
INTRAMUSCULAR | Status: DC | PRN
  Administered 2023-07-20: .5 mg via INTRAVENOUS

## 2023-07-20 MED ORDER — DROPERIDOL 2.5 MG/ML IJ SOLN: 0.6250 mg | Freq: Once | INTRAMUSCULAR | Status: DC | PRN

## 2023-07-20 MED ORDER — CEFAZOLIN SODIUM-DEXTROSE 2-4 GM/100ML-% IV SOLN
2.0000 g | INTRAVENOUS | Status: AC
  Administered 2023-07-20: 2 g via INTRAVENOUS
  Filled 2023-07-20: qty 100

## 2023-07-20 MED ORDER — ACETAMINOPHEN 325 MG PO TABS: 325.0000 mg | ORAL_TABLET | Freq: Four times a day (QID) | ORAL | Status: DC | PRN

## 2023-07-20 MED ORDER — METHOCARBAMOL 500 MG PO TABS
500.0000 mg | ORAL_TABLET | Freq: Four times a day (QID) | ORAL | Status: DC | PRN
  Administered 2023-07-20: 500 mg via ORAL
  Filled 2023-07-20: qty 1

## 2023-07-20 MED ORDER — ACETAMINOPHEN 500 MG PO TABS
1000.0000 mg | ORAL_TABLET | Freq: Once | ORAL | Status: DC
  Filled 2023-07-20: qty 2

## 2023-07-20 MED ORDER — FUROSEMIDE 40 MG PO TABS
40.0000 mg | ORAL_TABLET | Freq: Every day | ORAL | Status: DC
  Administered 2023-07-20 – 2023-07-21 (×2): 40 mg via ORAL
  Filled 2023-07-20 (×2): qty 1

## 2023-07-20 MED ORDER — FLUTICASONE FUROATE-VILANTEROL 200-25 MCG/ACT IN AEPB: 1.0000 | INHALATION_SPRAY | Freq: Every day | RESPIRATORY_TRACT | Status: DC

## 2023-07-20 MED ORDER — 0.9 % SODIUM CHLORIDE (POUR BTL) OPTIME
TOPICAL | Status: DC | PRN
  Administered 2023-07-20: 1000 mL

## 2023-07-20 MED ORDER — METOPROLOL SUCCINATE ER 25 MG PO TB24
25.0000 mg | ORAL_TABLET | Freq: Every day | ORAL | Status: DC
  Administered 2023-07-21: 25 mg via ORAL
  Filled 2023-07-20: qty 1

## 2023-07-20 MED ORDER — MAGNESIUM CITRATE PO SOLN: 1.0000 | Freq: Once | ORAL | Status: DC | PRN

## 2023-07-20 MED ORDER — LACTATED RINGERS IV SOLN: INTRAVENOUS | Status: DC

## 2023-07-20 MED ORDER — OXYCODONE HCL 5 MG PO TABS
10.0000 mg | ORAL_TABLET | ORAL | Status: DC | PRN
  Administered 2023-07-20: 15 mg via ORAL
  Filled 2023-07-20: qty 3

## 2023-07-20 MED ORDER — UMECLIDINIUM BROMIDE 62.5 MCG/ACT IN AEPB
1.0000 | INHALATION_SPRAY | Freq: Every day | RESPIRATORY_TRACT | Status: DC
  Filled 2023-07-20: qty 7

## 2023-07-20 MED ORDER — DOCUSATE SODIUM 100 MG PO CAPS
100.0000 mg | ORAL_CAPSULE | Freq: Two times a day (BID) | ORAL | Status: DC
  Administered 2023-07-20 – 2023-07-21 (×3): 100 mg via ORAL
  Filled 2023-07-20 (×3): qty 1

## 2023-07-20 MED ORDER — ZOLPIDEM TARTRATE 5 MG PO TABS
10.0000 mg | ORAL_TABLET | Freq: Every evening | ORAL | Status: DC | PRN
  Administered 2023-07-20: 10 mg via ORAL
  Filled 2023-07-20: qty 2

## 2023-07-20 MED ORDER — ONDANSETRON HCL 4 MG/2ML IJ SOLN: 4.0000 mg | Freq: Four times a day (QID) | INTRAMUSCULAR | Status: DC | PRN

## 2023-07-20 MED ORDER — OXYCODONE HCL 5 MG PO TABS: 5.0000 mg | ORAL_TABLET | Freq: Once | ORAL | Status: DC | PRN

## 2023-07-20 MED ORDER — HYDROMORPHONE HCL 1 MG/ML IJ SOLN
INTRAMUSCULAR | Status: AC
  Filled 2023-07-20: qty 0.5

## 2023-07-20 MED ORDER — VASHE WOUND IRRIGATION OPTIME
TOPICAL | Status: DC | PRN
  Administered 2023-07-20: 34 [oz_av]

## 2023-07-20 MED ORDER — VANCOMYCIN HCL 1000 MG IV SOLR
INTRAVENOUS | Status: AC
Start: 2023-07-20 — End: ?
  Filled 2023-07-20: qty 20

## 2023-07-20 SURGICAL SUPPLY — 35 items
BAG COUNTER SPONGE SURGICOUNT (BAG) IMPLANT
BLADE SURG 21 STRL SS (BLADE) ×1 IMPLANT
BNDG COHESIVE 1X5 TAN STRL LF (GAUZE/BANDAGES/DRESSINGS) IMPLANT
BNDG COHESIVE 6X5 TAN ST LF (GAUZE/BANDAGES/DRESSINGS) IMPLANT
BNDG GAUZE DERMACEA FLUFF 4 (GAUZE/BANDAGES/DRESSINGS) ×2 IMPLANT
CANISTER WOUND CARE 500ML ATS (WOUND CARE) IMPLANT
COVER SURGICAL LIGHT HANDLE (MISCELLANEOUS) ×2 IMPLANT
DRAPE DERMATAC (DRAPES) IMPLANT
DRAPE INCISE IOBAN 66X45 STRL (DRAPES) IMPLANT
DRAPE U-SHAPE 47X51 STRL (DRAPES) ×1 IMPLANT
DRESSING PEEL AND PLAC PRVNA20 (GAUZE/BANDAGES/DRESSINGS) IMPLANT
DRSG ADAPTIC 3X8 NADH LF (GAUZE/BANDAGES/DRESSINGS) ×1 IMPLANT
DRSG PEEL AND PLACE PREVENA 20 (GAUZE/BANDAGES/DRESSINGS) ×1 IMPLANT
DURAPREP 26ML APPLICATOR (WOUND CARE) ×1 IMPLANT
ELECT REM PT RETURN 9FT ADLT (ELECTROSURGICAL) IMPLANT
ELECTRODE REM PT RTRN 9FT ADLT (ELECTROSURGICAL) IMPLANT
GAUZE SPONGE 4X4 12PLY STRL (GAUZE/BANDAGES/DRESSINGS) ×1 IMPLANT
GLOVE BIOGEL PI IND STRL 9 (GLOVE) ×1 IMPLANT
GLOVE SURG ORTHO 9.0 STRL STRW (GLOVE) ×1 IMPLANT
GOWN STRL REUS W/ TWL XL LVL3 (GOWN DISPOSABLE) ×2 IMPLANT
KIT BASIN OR (CUSTOM PROCEDURE TRAY) ×1 IMPLANT
KIT TURNOVER KIT B (KITS) ×1 IMPLANT
MANIFOLD NEPTUNE II (INSTRUMENTS) ×1 IMPLANT
NS IRRIG 1000ML POUR BTL (IV SOLUTION) ×1 IMPLANT
PACK ORTHO EXTREMITY (CUSTOM PROCEDURE TRAY) ×1 IMPLANT
PAD ARMBOARD 7.5X6 YLW CONV (MISCELLANEOUS) ×2 IMPLANT
SET HNDPC FAN SPRY TIP SCT (DISPOSABLE) IMPLANT
SPLINT FIBERGLASS 3X12 (CAST SUPPLIES) IMPLANT
STOCKINETTE IMPERVIOUS 9X36 MD (GAUZE/BANDAGES/DRESSINGS) IMPLANT
SUT ETHILON 2 0 PSLX (SUTURE) ×1 IMPLANT
SWAB COLLECTION DEVICE MRSA (MISCELLANEOUS) ×1 IMPLANT
SWAB CULTURE ESWAB REG 1ML (MISCELLANEOUS) IMPLANT
TOWEL GREEN STERILE (TOWEL DISPOSABLE) ×1 IMPLANT
TUBE CONNECTING 12X1/4 (SUCTIONS) ×1 IMPLANT
YANKAUER SUCT BULB TIP NO VENT (SUCTIONS) ×1 IMPLANT

## 2023-07-20 NOTE — Interval H&P Note (Signed)
 History and Physical Interval Note:  07/20/2023 9:51 AM  [name]  has presented today for surgery, with the diagnosis of Left Elbow Wound Dehiscence.  The various methods of treatment have been discussed with the patient and family. After consideration of risks, benefits and other options for treatment, the patient has consented to  Procedure(s): LEFT ELBOW DEBRIDEMENT AND CLOSURE (Left) as a surgical intervention.  The patient's history has been reviewed, patient examined, no change in status, stable for surgery.  I have reviewed the patient's chart and labs.  Questions were answered to the patient's satisfaction.     Brendan Hopkins

## 2023-07-20 NOTE — H&P (Signed)
 Brendan Hopkins is an 88 y.o. male.   Chief Complaint: Dehiscence left elbow wound HPI: Patient is an 88 year old gentleman status post excisional debridement of a large olecranon bursa. Postoperatively patient developed bleeding secondary to his anticoagulation therapy with Xarelto. Patient has begun dressing changes. After wound dehiscence and debridement biologic tissue graft was applied. Patient states we changed the dressing he accidentally remove the biologic tissue graft.   Past Medical History:  Diagnosis Date   A-fib [facility])    Acute deep vein thrombosis (DVT) of tibial vein of right lower extremity (HCC)    after ankle fracture/surgery (2/21)   Arthritis    RIGHT HIP, HANDS, BACK   Cancer (HCC)    Skin cancer - face, neck and back   Cataract    bilateral - removed   Cervical radiculopathy    severe C3-4 neuroforaminal stenosis, C5-6 left synovial cyst compressing left nerve root.   Chronic kidney disease    stage 3a   Colon polyps    2005   COPD (chronic obstructive pulmonary disease) (HCC)    SMOKED FOR 10 YRS   Cystoid macular degeneration of both eyes    Diverticulosis    2005   Fatty liver    GERD (gastroesophageal reflux disease)    Hard of hearing    has hearing aids   History of kidney stones    Hypertension    Leg fracture, right    Oxygen dependent    2L via [place].  Patient wears oxygen at night and when need during day   Pain    RIGHT HIP AND BACK   Personal history of colonic polyps-adenoma and sessile serrated polyp 07/15/2010   Pneumonia 03/2019   x several   RBBB    HX AFIB IN PAST- PAC'S   Shortness of breath    WITH EXERTION ONLY   Stroke (HCC)    TIA hx   Wears glasses     Past Surgical History:  Procedure Laterality Date   COLONOSCOPY     maybe 3    COLONOSCOPY W/ POLYPECTOMY     CYSTOSCOPY WITH RETROGRADE PYELOGRAM, URETEROSCOPY AND STENT PLACEMENT Left 12/11/2019   Procedure: CYSTOSCOPY WITH LEFT RETROGRADE LEFT  URETEROSCOPY WITH  HOLMIUM LASER AND STENT PLACEMENT;  Surgeon: Bjorn Pippin, MD;  Location: [facility];  Service: Urology;  Laterality: Left;   EYE SURGERY Bilateral    x 5   OLECRANON BURSECTOMY Left 07/01/2023   Procedure: EXCISION LEFT ELBOW OLECRANON (ELBOW) BURSA;  Surgeon: Nadara Mustard, MD;  Location[facility] OR;  Service: Orthopedics;  Laterality: Left;   ORIF ANKLE FRACTURE Right 07/11/2019   Procedure: OPEN REDUCTION INTERNAL FIXATION (ORIF) ANKLE FRACTURE;  Surgeon: Roby Lofts, MD;  Location: [facility] OR;  Service: Orthopedics;  Laterality: Right;   skin cancers removed      TOTAL HIP ARTHROPLASTY Right 01/09/2013   Procedure: RIGHT TOTAL HIP ARTHROPLASTY ANTERIOR APPROACH;  Surgeon: Shelda Pal, MD;  Location: [facility];  Service: Orthopedics;  Laterality: Right;   TOTAL HIP ARTHROPLASTY Left 02/24/2021   Procedure: TOTAL HIP ARTHROPLASTY ANTERIOR APPROACH;  Surgeon: Durene Romans, MD;  Location: [facility];  Service: Orthopedics;  Laterality: Left;    Family History  Problem Relation Age of Onset   Cancer Mother        died more of old age at age 21   Cancer Father    Colon cancer Neg Hx    Esophageal cancer Neg Hx    Prostate  cancer Neg Hx    Rectal cancer Neg Hx    Stomach cancer Neg Hx    Social History:  reports that he quit smoking about 16 years ago. His smoking use included cigarettes. He started smoking about 73 years ago. He has a 114 pack-year smoking history. He has never used smokeless tobacco. He reports that he does not drink alcohol and does not use drugs.  Allergies: No Known Allergies  No medications prior to admission.    No results found for this or any previous visit (from the past 48 hours). No results found.  Review of Systems  All other systems reviewed and are negative.   There were no vitals taken for this visit. Physical Exam  Patient is alert, oriented, no adenopathy, well-dressed, normal affect, normal respiratory effort. Examination the wound bed has healthy  granulation tissue there is no surrounding cellulitis.  The open wound measures 2 x 4 cm.  The distal aspect of the incision has healed well.  Patient has epiboly around the wound edges with a stalled wound. Assessment/Plan 1. Chronic anticoagulation   2. Elbow wound, left, initial encounter   3. Olecranon bursitis, left elbow       Plan: With the persistent wound and elbow motion feel that it would be quickest for patient to undergo debridement with local tissue rearrangement for wound closure and splinting of the elbow.  Risk and benefits were discussed including infection nonhealing of the wound and need for additional surgery.  Patient states he understands wished to proceed at this time plan for surgery  Nadara Mustard, MD 07/20/2023, 6:44 AM

## 2023-07-20 NOTE — Progress Notes (Signed)
 Patient called this am to say he was unable to find someone to stay with him for 24 hours after surgery. I told him he needed to call Dr. Audrie Lia office before coming to the hospital to let Dr. Lajoyce Corners know since he might require admission after surgery. I informed Doug Sou RN of pt's call. I will also contact Santiago Glad in Dr. Linard Millers office. Patient also asked about his taking his Protonix and Furosemide which come in a packet together. He was unclear which pill was which and asked me the colors of the medicines. I said I could not tell him over the phone and if he did not know, he should not take either one. He agreed that he would not.

## 2023-07-20 NOTE — Op Note (Signed)
 07/20/2023  12:31 PM  PATIENT:  Brendan Hopkins    PRE-OPERATIVE DIAGNOSIS:  Left Elbow Wound Dehiscence  POST-OPERATIVE DIAGNOSIS:  Same  PROCEDURE:  LEFT ELBOW EXCISIONAL DEBRIDEMENT WITH EXCISION SKIN SOFT TISSUE MUSCLE AND FASCIA. LOCAL TISSUE REARRANGEMENT FOR WOUND CLOSURE 15 X 4 CM. TISSUE SENT FOR CULTURES. APPLICATION 21 CM WOUND VAC. Application vancomycin powder 1 g.  SURGEON:  Nadara Mustard, MD  PHYSICIAN ASSISTANT:None ANESTHESIA:   General  PREOPERATIVE INDICATIONS:  Brendan Hopkins is a  88 y.o. male with a diagnosis of Left Elbow Wound Dehiscence who failed conservative measures and elected for surgical management.    The risks benefits and alternatives were discussed with the patient preoperatively including but not limited to the risks of infection, bleeding, nerve injury, cardiopulmonary complications, the need for revision surgery, among others, and the patient was willing to proceed.  OPERATIVE IMPLANTS:   * No implants in log *  @ENCIMAGES @  OPERATIVE FINDINGS: Tissue margins were clear.  Tissue sent for cultures.  OPERATIVE PROCEDURE: Patient brought the operating room and underwent a general anesthetic.  After adequate levels anesthesia obtained patient's left lower extremity was prepped using DuraPrep draped into a sterile field a timeout was called.  Elliptical incision was made around the area of wound dehiscence.  This left a wound that was 15 x 4 cm.  The fibrinous tissue and nonviable tissue was excised with a 21 blade knife as well as a rondure.  The tissue margins were clear.  The wound was irrigated with Vashe.  Electrocautery was used for hemostasis.  The tissue margins were undermined to allow for local tissue transfer for wound closure 15 x 4 cm with 2-0 nylon.  A 21 cm Prevena wound VAC was applied this had a good suction fit.  To splints were applied to maintain the elbow in  extension.  A circumferential compression wrap was applied for the  metacarpal heads to the axilla.  Patient was extubated taken the PACU in stable condition.   DISCHARGE PLANNING:  Antibiotic duration: Continue antibiotics for 24 hours then resume oral antibiotics  Weightbearing: Not applicable  Pain medication: Opioid pathway  Dressing care/ Wound VAC: Wound VAC discharge with the Prevena plus portable wound VAC pump.  Ambulatory devices: As needed  Discharge to: Anticipate discharge to home tomorrow.  Follow-up: In the office 1 week post operative.

## 2023-07-20 NOTE — Plan of Care (Signed)

## 2023-07-20 NOTE — Anesthesia Procedure Notes (Signed)
 Procedure Name: LMA Insertion Date/Time: 07/20/2023 11:28 AM  Performed by: Shary Decamp, CRNAPre-anesthesia Checklist: Patient identified, Emergency Drugs available, Suction available and Patient being monitored Patient Re-evaluated:Patient Re-evaluated prior to induction Oxygen Delivery Method: Circle system utilized Preoxygenation: Pre-oxygenation with 100% oxygen Induction Type: IV induction Ventilation: Mask ventilation without difficulty LMA: LMA flexible inserted LMA Size: 5.0 Number of attempts: 1 Placement Confirmation: positive ETCO2 and breath sounds checked- equal and bilateral Tube secured with: Tape Dental Injury: Teeth and Oropharynx as per pre-operative assessment

## 2023-07-20 NOTE — Interval H&P Note (Signed)
 History and Physical Interval Note:  07/20/2023 10:51 AM  Brendan Hopkins  has presented today for surgery, with the diagnosis of Left Elbow Wound Dehiscence.  The various methods of treatment have been discussed with the patient and family. After consideration of risks, benefits and other options for treatment, the patient has consented to  Procedure(s): LEFT ELBOW DEBRIDEMENT AND CLOSURE (Left) as a surgical intervention.  The patient's history has been reviewed, patient examined, no change in status, stable for surgery.  I have reviewed the patient's chart and labs.  Questions were answered to the patient's satisfaction.     Brendan Hopkins

## 2023-07-20 NOTE — Transfer of Care (Signed)
 Immediate Anesthesia Transfer of Care Note  Patient: Brendan Hopkins  Procedure(s) Performed: LEFT ELBOW DEBRIDEMENT AND CLOSURE (Left: Elbow)  Patient Location: PACU  Anesthesia Type:General  Level of Consciousness: awake, alert , and oriented  Airway & Oxygen Therapy: Patient Spontanous Breathing and Patient connected to nasal cannula oxygen  Post-op Assessment: Report given to RN and Post -op Vital signs reviewed and stable  Post vital signs: Reviewed and stable  Last Vitals:  Vitals Value Taken Time  BP 154/75 07/20/23 1216  Temp    Pulse 66 07/20/23 1218  Resp 16 07/20/23 1218  SpO2 97 % 07/20/23 1218  Vitals shown include unfiled device data.  Last Pain:  Vitals:   07/20/23 1034  TempSrc:   PainSc: 0-No pain         Complications: No notable events documented.

## 2023-07-20 NOTE — Anesthesia Postprocedure Evaluation (Signed)
 Anesthesia Post Note  Patient: Brendan Hopkins  Procedure(s) Performed: LEFT ELBOW DEBRIDEMENT AND CLOSURE (Left: Elbow)     Patient location during evaluation: PACU Anesthesia Type: General Level of consciousness: awake and alert Pain management: pain level controlled Vital Signs Assessment: post-procedure vital signs reviewed and stable Respiratory status: spontaneous breathing, nonlabored ventilation, respiratory function stable and patient connected to nasal cannula oxygen Cardiovascular status: blood pressure returned to baseline and stable Postop Assessment: no apparent nausea or vomiting Anesthetic complications: no   No notable events documented.  Last Vitals:  Vitals:   07/20/23 1415 07/20/23 1442  BP: 127/72 135/63  Pulse: 65 68  Resp: 18   Temp:    SpO2: 93% 97%    Last Pain:  Vitals:   07/20/23 1442  TempSrc:   PainSc: 3                  Hamlin Nation

## 2023-07-21 ENCOUNTER — Encounter (HOSPITAL_COMMUNITY): Payer: Self-pay | Admitting: Orthopedic Surgery

## 2023-07-21 ENCOUNTER — Encounter: Payer: Medicare Other | Admitting: Orthopedic Surgery

## 2023-07-21 DIAGNOSIS — T8131XA Disruption of external operation (surgical) wound, not elsewhere classified, initial encounter: Secondary | ICD-10-CM | POA: Diagnosis not present

## 2023-07-21 DIAGNOSIS — Z85828 Personal history of other malignant neoplasm of skin: Secondary | ICD-10-CM | POA: Diagnosis not present

## 2023-07-21 DIAGNOSIS — J449 Chronic obstructive pulmonary disease, unspecified: Secondary | ICD-10-CM | POA: Diagnosis not present

## 2023-07-21 DIAGNOSIS — Z96643 Presence of artificial hip joint, bilateral: Secondary | ICD-10-CM | POA: Diagnosis not present

## 2023-07-21 DIAGNOSIS — Z8673 Personal history of transient ischemic attack (TIA), and cerebral infarction without residual deficits: Secondary | ICD-10-CM | POA: Diagnosis not present

## 2023-07-21 DIAGNOSIS — I129 Hypertensive chronic kidney disease with stage 1 through stage 4 chronic kidney disease, or unspecified chronic kidney disease: Secondary | ICD-10-CM | POA: Diagnosis not present

## 2023-07-21 DIAGNOSIS — Z86718 Personal history of other venous thrombosis and embolism: Secondary | ICD-10-CM | POA: Diagnosis not present

## 2023-07-21 DIAGNOSIS — Z87891 Personal history of nicotine dependence: Secondary | ICD-10-CM | POA: Diagnosis not present

## 2023-07-21 DIAGNOSIS — S51002A Unspecified open wound of left elbow, initial encounter: Secondary | ICD-10-CM | POA: Diagnosis not present

## 2023-07-21 DIAGNOSIS — N1831 Chronic kidney disease, stage 3a: Secondary | ICD-10-CM | POA: Diagnosis not present

## 2023-07-21 DIAGNOSIS — I4891 Unspecified atrial fibrillation: Secondary | ICD-10-CM | POA: Diagnosis not present

## 2023-07-21 DIAGNOSIS — Z7901 Long term (current) use of anticoagulants: Secondary | ICD-10-CM | POA: Diagnosis not present

## 2023-07-21 DIAGNOSIS — M7022 Olecranon bursitis, left elbow: Secondary | ICD-10-CM | POA: Diagnosis not present

## 2023-07-21 MED ORDER — OXYCODONE-ACETAMINOPHEN 5-325 MG PO TABS: 1.0000 | ORAL_TABLET | ORAL | 0 refills | Status: DC | PRN

## 2023-07-21 NOTE — Evaluation (Signed)
 Physical Therapy Brief Evaluation and Discharge Note Patient Details Name: Brendan Hopkins MRN: [id number] DOB: 1936/02/10 Today's Date: 07/21/2023   History of Present Illness  88 year old gentleman presented 07/20/23 for I&D of left elbow wound due to dehiscence. PMH excisional debridement of a large olecranon bursa; afib, DVT RLE, cervical radiculopathy, CKD, COPD (O2 at night), HOH, HTN, Stroke  Clinical Impression   Patient evaluated by Physical Therapy with no further acute PT needs identified. All education has been completed and the patient has no further questions. Patient mobilizing indedpendently including ambulating 200 ft on RA with sats 92%.  PT is signing off. Thank you for this referral.        PT Assessment Patient does not need any further PT services  Assistance Needed at Discharge  None    Equipment Recommendations None recommended by PT  Recommendations for Other Services       Precautions/Restrictions Precautions Precautions: None Restrictions Weight Bearing Restrictions Per Provider Order: No        Mobility  Bed Mobility   Supine/Sidelying to sit: Independent      Transfers Overall transfer level: Independent Equipment used: None                    Ambulation/Gait Ambulation/Gait assistance: Independent Gait Distance (Feet): 200 Feet Assistive device: None Gait Pattern/deviations: WFL(Within Functional Limits) Gait Speed: Pace WFL    Home Activity Instructions    Stairs            Modified Rankin (Stroke Patients Only)        Balance Overall balance assessment: Independent                        Pertinent Vitals/Pain PT - Brief Vital Signs All Vital Signs Stable: Yes (sats 92% on RA) Pain Assessment Pain Assessment: No/denies pain     Home Living Family/patient expects to be discharged to:: Private residence Living Arrangements: Alone Available Help at Discharge: Friend(s);Family;Available  PRN/intermittently Home Environment: Stairs to enter;Rail - right;Rail - left  Stairs-Number of Steps: 2 Home Equipment: Rolling Walker (2 wheels);Other (comment);Ship broker trike you ride if you break your leg", home O2 for at night)        Prior Function Level of Independence: Independent      UE/LE Assessment   UE ROM/Strength/Tone/Coordination: Impaired UE ROM/Strength/Tone/Coordination Deficits: LUE wrapped from hand to above elbow; reports he cannot bend it  LE ROM/Strength/Tone/Coordination: [facility]      Communication   Communication Communication: Impaired Factors Affecting Communication: Hearing impaired     Cognition Overall Cognitive Status: Appears within functional limits for tasks assessed/performed       General Comments      Exercises     Assessment/Plan    PT Problem List         PT Visit Diagnosis Difficulty in walking, not elsewhere classified (R26.2)    No Skilled PT Patient at baseline level of functioning;Patient is independent with all acitivity/mobility   Co-evaluation                AMPAC 6 Clicks Help needed turning from your back to your side while in a flat bed without using bedrails?: None Help needed moving from lying on your back to sitting on the side of a flat bed without using bedrails?: None Help needed moving to and from a bed to a chair (including a wheelchair)?: None Help needed standing up from a chair using your  arms (e.g., wheelchair or bedside chair)?: None Help needed to walk in hospital room?: None Help needed climbing 3-5 steps with a railing? : None 6 Click Score: 24      End of Session   Activity Tolerance: Patient tolerated treatment well Patient left: in bed;with call bell/phone within reach Nurse Communication: Mobility status;Other (comment) (Ok to dc; no DME or PT needs) PT Visit Diagnosis: Difficulty in walking, not elsewhere classified (R26.2)     Time: 7829-5621 PT Time  Calculation (min) (ACUTE ONLY): 18 min  Charges:   PT Evaluation $PT Eval Low Complexity: 1 Low       Jerolyn Center, PT Acute Rehabilitation Services  Office [phone]   Zena Amos  07/21/2023, 9:37 AM

## 2023-07-21 NOTE — Discharge Summary (Signed)
 Physician Discharge Summary  Patient ID: Brendan Hopkins MRN: [id number] DOB/AGE: [birth year] 88 y.o.  Admit date: 07/20/2023 Discharge date: 07/21/2023  Admission Diagnoses:  Principal Problem:   Wound dehiscence, surgical, initial encounter   Discharge Diagnoses:  Same  Past Medical History:  Diagnosis Date   A-fib [facility])    Acute deep vein thrombosis (DVT) of tibial vein of right lower extremity (HCC)    after ankle fracture/surgery (2/21)   Arthritis    RIGHT HIP, HANDS, BACK   Cancer (HCC)    Skin cancer - face, neck and back   Cataract    bilateral - removed   Cervical radiculopathy    severe C3-4 neuroforaminal stenosis, C5-6 left synovial cyst compressing left nerve root.   Chronic kidney disease    stage 3a   Colon polyps    2005   COPD (chronic obstructive pulmonary disease) (HCC)    SMOKED FOR 39 YRS   Cystoid macular degeneration of both eyes    Diverticulosis    2005   Fatty liver    GERD (gastroesophageal reflux disease)    Hard of hearing    has hearing aids   History of kidney stones    Hypertension    Leg fracture, right    Oxygen dependent    2L via [place].  Patient wears oxygen at night and when need during day   Pain    RIGHT HIP AND BACK   Personal history of colonic polyps-adenoma and sessile serrated polyp 07/15/2010   Pneumonia 03/2019   x several   RBBB    HX AFIB IN PAST- PAC'S   Shortness of breath    WITH EXERTION ONLY   Stroke [facility])    TIA hx   Wears glasses     Surgeries: Procedure(s): LEFT ELBOW DEBRIDEMENT AND CLOSURE on 07/20/2023   Consultants:   Discharged Condition: Improved  Hospital Course: Brendan Hopkins is an 88 y.o. male who was admitted 07/20/2023 with a chief complaint of No chief complaint on file. , and found to have a diagnosis of Wound dehiscence, surgical, initial encounter.  They were brought to the operating room on 07/20/2023 and underwent the above named procedures.    They were given perioperative  antibiotics:  Anti-infectives (From admission, onward)    Start     Dose/Rate Route Frequency Ordered Stop   07/20/23 1730  ceFAZolin (ANCEF) IVPB 2g/100 mL premix        2 g 200 mL/hr over 30 Minutes Intravenous Every 6 hours 07/20/23 1431 07/20/23 2224   07/20/23 1152  vancomycin (VANCOCIN) powder  Status:  Discontinued          As needed 07/20/23 1153 07/20/23 1208   07/20/23 1000  ceFAZolin (ANCEF) IVPB 2g/100 mL premix        2 g 200 mL/hr over 30 Minutes Intravenous On call to O.R. 07/20/23 0957 07/20/23 1200     .  They were given compression stockings, early ambulation, and chemoprophylaxis for DVT prophylaxis.  They benefited maximally from their hospital stay and there were no complications.    Recent vital signs:  Vitals:   07/21/23 0050 07/21/23 0526  BP: 125/86 (!) 141/76  Pulse: 79 68  Resp: 18 19  Temp: 97.8 F (36.6 C) 98 F (36.7 C)  SpO2: 92% 95%    Recent laboratory studies:  Results for orders placed or performed during the hospital encounter of 07/20/23  Aerobic/Anaerobic Culture w Gram Stain (surgical/deep wound)   Collection  Time: 07/20/23 11:21 AM   Specimen: Soft Tissue, Other  Result Value Ref Range   Specimen Description TISSUE    Special Requests NONE    Gram Stain      FEW WBC PRESENT, PREDOMINANTLY MONONUCLEAR NO ORGANISMS SEEN Performed at [facility], [address], [place], [place] [address]    Culture PENDING    Report Status PENDING     Discharge Medications:   Allergies as of 07/21/2023   No Known Allergies      Medication List     TAKE these medications    acetaminophen 650 MG CR tablet Commonly known as: TYLENOL Take 650 mg by mouth 3 (three) times daily as needed for pain.   albuterol 108 (90 Base) MCG/ACT inhaler Commonly known as: VENTOLIN HFA Inhale 1 to 2 puffs by mouth every 6 hours as needed for shortness of breath What changed: See the new instructions.   amLODipine 5 MG tablet Commonly known  as: NORVASC Take 1 tablet by mouth at bedtime   colchicine 0.6 MG tablet Take 1 tablet (0.6 mg total) by mouth daily.   Fish Oil 1200 MG Caps Take 1,200 mg by mouth every evening.   furosemide 40 MG tablet Commonly known as: LASIX Take 1 tablet by mouth once daily   gabapentin 300 MG capsule Commonly known as: NEURONTIN Take 1 capsule (300 mg total) by mouth 3 (three) times daily. TAKE 1 CAPSULE BY MOUTH THREE TIMES DAILY AS NEEDED FOR  SCIATICA   Iron (Ferrous Sulfate) 325 (65 Fe) MG Tabs Take 325 mg by mouth daily.   meclizine 25 MG tablet Commonly known as: ANTIVERT TAKE 1 TABLET BY MOUTH THREE TIMES DAILY AS NEEDED FOR DIZZINESS What changed: See the new instructions.   MEMORY COMPLEX BRAIN HEALTH PO Take 1 tablet by mouth daily.   metoprolol succinate 25 MG 24 hr tablet Commonly known as: TOPROL-XL Take 1 tablet by mouth once daily   multivitamin with minerals Tabs tablet Take 1 tablet by mouth daily.   oxyCODONE-acetaminophen 5-325 MG tablet Commonly known as: PERCOCET/ROXICET Take 1 tablet by mouth every 4 (four) hours as needed.   OXYGEN Inhale 2 L into the lungs daily as needed (Shortness of breath).   pantoprazole 40 MG tablet Commonly known as: PROTONIX Take 1 tablet by mouth every day   rivaroxaban 20 MG Tabs tablet Commonly known as: Xarelto Take 1 tablet (20 mg total) by mouth daily.   Trelegy Ellipta 200-62.5-25 MCG/ACT Aepb Generic drug: Fluticasone-Umeclidin-Vilant Inhale 1 puff into the lungs daily.   zolpidem 10 MG tablet Commonly known as: AMBIEN Take 1 tablet (10 mg total) by mouth at bedtime as needed. for sleep What changed: when to take this        Diagnostic Studies: No results found.  Disposition: Discharge disposition: 01-Home or Self Care       Discharge Instructions     Call MD / Call 911   Complete by: As directed    If you experience chest pain or shortness of breath, CALL 911 and be transported to the hospital  emergency room.  If you develope a fever above 101 F, pus (white drainage) or increased drainage or redness at the wound, or calf pain, call your surgeon's office.   Constipation Prevention   Complete by: As directed    Drink plenty of fluids.  Prune juice may be helpful.  You may use a stool softener, such as Colace (over the counter) 100 mg twice  a day.  Use MiraLax (over the counter) for constipation as needed.   Diet - low sodium heart healthy   Complete by: As directed    Increase activity slowly as tolerated   Complete by: As directed    Post-operative opioid taper instructions:   Complete by: As directed    POST-OPERATIVE OPIOID TAPER INSTRUCTIONS: It is important to wean off of your opioid medication as soon as possible. If you do not need pain medication after your surgery it is ok to stop day one. Opioids include: Codeine, Hydrocodone(Norco, Vicodin), Oxycodone(Percocet, oxycontin) and hydromorphone amongst others.  Long term and even short term use of opiods can cause: Increased pain response Dependence Constipation Depression Respiratory depression And more.  Withdrawal symptoms can include Flu like symptoms Nausea, vomiting And more Techniques to manage these symptoms Hydrate well Eat regular healthy meals Stay active Use relaxation techniques(deep breathing, meditating, yoga) Do Not substitute Alcohol to help with tapering If you have been on opioids for less than two weeks and do not have pain than it is ok to stop all together.  Plan to wean off of opioids This plan should start within one week post op of your joint replacement. Maintain the same interval or time between taking each dose and first decrease the dose.  Cut the total daily intake of opioids by one tablet each day Next start to increase the time between doses. The last dose that should be eliminated is the evening dose.           Follow-up Information     Nadara Mustard, MD Follow up in 1  week(s).   Specialty: Orthopedic Surgery Contact information: [address] [place] [address] [phone]                  Signed: Nadara Mustard 07/21/2023, 7:53 AM

## 2023-07-21 NOTE — Discharge Summary (Signed)
 Physician Discharge Summary  Patient ID: Brendan Hopkins MRN: [id number] DOB/AGE: Sep 19, 1935 88 y.o.  Admit date: 07/20/2023 Discharge date: 07/21/2023  Admission Diagnoses:  Principal Problem:   Wound dehiscence, surgical, initial encounter   Discharge Diagnoses:  Same  Past Medical History:  Diagnosis Date   A-fib [facility])    Acute deep vein thrombosis (DVT) of tibial vein of right lower extremity (HCC)    after ankle fracture/surgery (2/21)   Arthritis    RIGHT HIP, HANDS, BACK   Cancer (HCC)    Skin cancer - face, neck and back   Cataract    bilateral - removed   Cervical radiculopathy    severe C3-4 neuroforaminal stenosis, C5-6 left synovial cyst compressing left nerve root.   Chronic kidney disease    stage 3a   Colon polyps    2005   COPD (chronic obstructive pulmonary disease) (HCC)    SMOKED FOR 35 YRS   Cystoid macular degeneration of both eyes    Diverticulosis    2005   Fatty liver    GERD (gastroesophageal reflux disease)    Hard of hearing    has hearing aids   History of kidney stones    Hypertension    Leg fracture, right    Oxygen dependent    2L via [place].  Patient wears oxygen at night and when need during day   Pain    RIGHT HIP AND BACK   Personal history of colonic polyps-adenoma and sessile serrated polyp 07/15/2010   Pneumonia 03/2019   x several   RBBB    HX AFIB IN PAST- PAC'S   Shortness of breath    WITH EXERTION ONLY   Stroke [facility])    TIA hx   Wears glasses     Surgeries: Procedure(s): LEFT ELBOW DEBRIDEMENT AND CLOSURE on 07/20/2023   Consultants:   Discharged Condition: Improved  Hospital Course: Brendan Hopkins is an 88 y.o. male who was admitted 07/20/2023 with a chief complaint of No chief complaint on file. , and found to have a diagnosis of Wound dehiscence, surgical, initial encounter.  They were brought to the operating room on 07/20/2023 and underwent the above named procedures.    They were given perioperative  antibiotics:  Anti-infectives (From admission, onward)    Start     Dose/Rate Route Frequency Ordered Stop   07/20/23 1730  ceFAZolin (ANCEF) IVPB 2g/100 mL premix        2 g 200 mL/hr over 30 Minutes Intravenous Every 6 hours 07/20/23 1431 07/20/23 2224   07/20/23 1152  vancomycin (VANCOCIN) powder  Status:  Discontinued          As needed 07/20/23 1153 07/20/23 1208   07/20/23 1000  ceFAZolin (ANCEF) IVPB 2g/100 mL premix        2 g 200 mL/hr over 30 Minutes Intravenous On call to O.R. 07/20/23 0957 07/20/23 1200     .  They were given compression stockings, early ambulation, and chemoprophylaxis for DVT prophylaxis.  They benefited maximally from their hospital stay and there were no complications.    Recent vital signs:  Vitals:   07/21/23 0526 07/21/23 0840  BP: (!) 141/76 (!) 141/75  Pulse: 68 87  Resp: 19 18  Temp: 98 F (36.7 C) 98.5 F (36.9 C)  SpO2: 95% 93%    Recent laboratory studies:  Results for orders placed or performed during the hospital encounter of 07/20/23  Aerobic/Anaerobic Culture w Gram Stain (surgical/deep wound)  Collection Time: 07/20/23 11:21 AM   Specimen: Soft Tissue, Other  Result Value Ref Range   Specimen Description TISSUE    Special Requests NONE    Gram Stain      FEW WBC PRESENT, PREDOMINANTLY MONONUCLEAR NO ORGANISMS SEEN Performed at [facility], [address] [place], [place] [address]    Culture PENDING    Report Status PENDING     Discharge Medications:   Allergies as of 07/21/2023   No Known Allergies      Medication List     TAKE these medications    acetaminophen 650 MG CR tablet Commonly known as: TYLENOL Take 650 mg by mouth 3 (three) times daily as needed for pain.   albuterol 108 (90 Base) MCG/ACT inhaler Commonly known as: VENTOLIN HFA Inhale 1 to 2 puffs by mouth every 6 hours as needed for shortness of breath What changed: See the new instructions.   amLODipine 5 MG tablet Commonly  known as: NORVASC Take 1 tablet by mouth at bedtime   colchicine 0.6 MG tablet Take 1 tablet (0.6 mg total) by mouth daily.   Fish Oil 1200 MG Caps Take 1,200 mg by mouth every evening.   furosemide 40 MG tablet Commonly known as: LASIX Take 1 tablet by mouth once daily   gabapentin 300 MG capsule Commonly known as: NEURONTIN Take 1 capsule (300 mg total) by mouth 3 (three) times daily. TAKE 1 CAPSULE BY MOUTH THREE TIMES DAILY AS NEEDED FOR  SCIATICA   Iron (Ferrous Sulfate) 325 (65 Fe) MG Tabs Take 325 mg by mouth daily.   meclizine 25 MG tablet Commonly known as: ANTIVERT TAKE 1 TABLET BY MOUTH THREE TIMES DAILY AS NEEDED FOR DIZZINESS What changed: See the new instructions.   MEMORY COMPLEX BRAIN HEALTH PO Take 1 tablet by mouth daily.   metoprolol succinate 25 MG 24 hr tablet Commonly known as: TOPROL-XL Take 1 tablet by mouth once daily   multivitamin with minerals Tabs tablet Take 1 tablet by mouth daily.   oxyCODONE-acetaminophen 5-325 MG tablet Commonly known as: PERCOCET/ROXICET Take 1 tablet by mouth every 4 (four) hours as needed.   OXYGEN Inhale 2 L into the lungs daily as needed (Shortness of breath).   pantoprazole 40 MG tablet Commonly known as: PROTONIX Take 1 tablet by mouth every day   rivaroxaban 20 MG Tabs tablet Commonly known as: Xarelto Take 1 tablet (20 mg total) by mouth daily.   Trelegy Ellipta 200-62.5-25 MCG/ACT Aepb Generic drug: Fluticasone-Umeclidin-Vilant Inhale 1 puff into the lungs daily.   zolpidem 10 MG tablet Commonly known as: AMBIEN Take 1 tablet (10 mg total) by mouth at bedtime as needed. for sleep What changed: when to take this        Diagnostic Studies: No results found.  Disposition: Discharge disposition: 01-Home or Self Care       Discharge Instructions     Call MD / Call 911   Complete by: As directed    If you experience chest pain or shortness of breath, CALL 911 and be transported to the  hospital emergency room.  If you develope a fever above 101 F, pus (white drainage) or increased drainage or redness at the wound, or calf pain, call your surgeon's office.   Call MD / Call 911   Complete by: As directed    If you experience chest pain or shortness of breath, CALL 911 and be transported to the hospital emergency room.  If you  develope a fever above 101 F, pus (white drainage) or increased drainage or redness at the wound, or calf pain, call your surgeon's office.   Constipation Prevention   Complete by: As directed    Drink plenty of fluids.  Prune juice may be helpful.  You may use a stool softener, such as Colace (over the counter) 100 mg twice a day.  Use MiraLax (over the counter) for constipation as needed.   Constipation Prevention   Complete by: As directed    Drink plenty of fluids.  Prune juice may be helpful.  You may use a stool softener, such as Colace (over the counter) 100 mg twice a day.  Use MiraLax (over the counter) for constipation as needed.   Diet - low sodium heart healthy   Complete by: As directed    Diet - low sodium heart healthy   Complete by: As directed    Increase activity slowly as tolerated   Complete by: As directed    Increase activity slowly as tolerated   Complete by: As directed    Negative Pressure Wound Therapy - Incisional   Complete by: As directed    Attached the wound VAC dressing to a Prevena plus portable wound VAC pump for discharge.   Post-operative opioid taper instructions:   Complete by: As directed    POST-OPERATIVE OPIOID TAPER INSTRUCTIONS: It is important to wean off of your opioid medication as soon as possible. If you do not need pain medication after your surgery it is ok to stop day one. Opioids include: Codeine, Hydrocodone(Norco, Vicodin), Oxycodone(Percocet, oxycontin) and hydromorphone amongst others.  Long term and even short term use of opiods can cause: Increased pain  response Dependence Constipation Depression Respiratory depression And more.  Withdrawal symptoms can include Flu like symptoms Nausea, vomiting And more Techniques to manage these symptoms Hydrate well Eat regular healthy meals Stay active Use relaxation techniques(deep breathing, meditating, yoga) Do Not substitute Alcohol to help with tapering If you have been on opioids for less than two weeks and do not have pain than it is ok to stop all together.  Plan to wean off of opioids This plan should start within one week post op of your joint replacement. Maintain the same interval or time between taking each dose and first decrease the dose.  Cut the total daily intake of opioids by one tablet each day Next start to increase the time between doses. The last dose that should be eliminated is the evening dose.      Post-operative opioid taper instructions:   Complete by: As directed    POST-OPERATIVE OPIOID TAPER INSTRUCTIONS: It is important to wean off of your opioid medication as soon as possible. If you do not need pain medication after your surgery it is ok to stop day one. Opioids include: Codeine, Hydrocodone(Norco, Vicodin), Oxycodone(Percocet, oxycontin) and hydromorphone amongst others.  Long term and even short term use of opiods can cause: Increased pain response Dependence Constipation Depression Respiratory depression And more.  Withdrawal symptoms can include Flu like symptoms Nausea, vomiting And more Techniques to manage these symptoms Hydrate well Eat regular healthy meals Stay active Use relaxation techniques(deep breathing, meditating, yoga) Do Not substitute Alcohol to help with tapering If you have been on opioids for less than two weeks and do not have pain than it is ok to stop all together.  Plan to wean off of opioids This plan should start within one week post op of your joint replacement. Maintain  the same interval or time between taking  each dose and first decrease the dose.  Cut the total daily intake of opioids by one tablet each day Next start to increase the time between doses. The last dose that should be eliminated is the evening dose.           Follow-up Information     Nadara Mustard, MD Follow up in 1 week(s).   Specialty: Orthopedic Surgery Contact information: [address] [place] [address] [phone]         Donita Brooks, MD Follow up.   Specialty: Family Medicine Contact information: [address] [place] [address] [phone]                  Signed: Nadara Mustard 07/21/2023, 11:28 AM

## 2023-07-21 NOTE — Care Management Obs Status (Signed)
 MEDICARE OBSERVATION STATUS NOTIFICATION   Patient Details  Name: Brendan Hopkins MRN: [id number] Date of Birth: June 05, 1935   Medicare Observation Status Notification Given:  Yes    Lorri Frederick, LCSW 07/21/2023, 8:45 AM

## 2023-07-21 NOTE — Progress Notes (Signed)
 Patient ID: Brendan Hopkins, male   DOB: 1935-11-16, 88 y.o.   MRN: [id number] Patient is up ambulating in the room independently this morning.  The dressing is clean and dry.  Plan for discharge to home today.

## 2023-07-25 ENCOUNTER — Other Ambulatory Visit: Payer: Self-pay | Admitting: Family Medicine

## 2023-07-25 ENCOUNTER — Telehealth: Payer: Self-pay

## 2023-07-25 ENCOUNTER — Other Ambulatory Visit: Payer: Self-pay | Admitting: Orthopedic Surgery

## 2023-07-25 LAB — AEROBIC/ANAEROBIC CULTURE W GRAM STAIN (SURGICAL/DEEP WOUND)

## 2023-07-25 MED ORDER — CIPROFLOXACIN HCL 500 MG PO TABS: 500.0000 mg | ORAL_TABLET | Freq: Two times a day (BID) | ORAL | 0 refills | Status: DC

## 2023-07-25 NOTE — Telephone Encounter (Signed)
 Called pt to advise of culture results. Advised Dr. Lajoyce Corners sent in ABX for him. Pt states that he will pick this up today and keep his appt as sch on Thursday.

## 2023-07-26 ENCOUNTER — Telehealth: Payer: Self-pay

## 2023-07-26 NOTE — Telephone Encounter (Signed)
 Patient left VM on triage phone. States that he had surgery 07/20/23-Left elbow deb & closure and has a wound vac . He states the [facility] is showing a "blockage/leak" has tried to fix it but is unable to. States that he will call the company about issue.  CB [phone]

## 2023-07-26 NOTE — Telephone Encounter (Signed)
 I SW pt, he said customer service # helped him trouble shoot the machine. Everything is working fine and he will see [place] Thursday.

## 2023-07-28 ENCOUNTER — Encounter: Payer: Self-pay | Admitting: Orthopedic Surgery

## 2023-07-28 ENCOUNTER — Ambulatory Visit: Payer: Medicare Other | Admitting: Orthopedic Surgery

## 2023-07-28 ENCOUNTER — Other Ambulatory Visit: Payer: Self-pay | Admitting: Family Medicine

## 2023-07-28 DIAGNOSIS — S51002A Unspecified open wound of left elbow, initial encounter: Secondary | ICD-10-CM

## 2023-07-28 DIAGNOSIS — M7022 Olecranon bursitis, left elbow: Secondary | ICD-10-CM
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
## 2023-08-10 ENCOUNTER — Encounter: Payer: Self-pay | Admitting: Family

## 2023-08-10 ENCOUNTER — Ambulatory Visit: Admitting: Family

## 2023-08-10 DIAGNOSIS — T8131XA Disruption of external operation (surgical) wound, not elsewhere classified, initial encounter: Secondary | ICD-10-CM

## 2023-08-10 DIAGNOSIS — M7022 Olecranon bursitis, left elbow: Secondary | ICD-10-CM
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
## 2023-08-24 ENCOUNTER — Encounter: Payer: Self-pay | Admitting: Physician Assistant

## 2023-08-24 ENCOUNTER — Ambulatory Visit: Attending: Physician Assistant | Admitting: Physician Assistant

## 2023-08-24 VITALS — BP 127/80 | HR 76 | Ht 70.0 in | Wt 248.6 lb

## 2023-08-24 DIAGNOSIS — I10 Essential (primary) hypertension: Secondary | ICD-10-CM

## 2023-08-24 DIAGNOSIS — I480 Paroxysmal atrial fibrillation: Secondary | ICD-10-CM | POA: Diagnosis not present

## 2023-08-24 DIAGNOSIS — J449 Chronic obstructive pulmonary disease, unspecified: Secondary | ICD-10-CM | POA: Diagnosis not present

## 2023-08-24 NOTE — Progress Notes (Unsigned)
 Cardiology Office Note:  .   Date:  08/25/2023  ID:  [name], DOB 1935/10/30, MRN [id number] PCP: Donita Brooks, MD  [facility] Providers Cardiologist:  Olga Millers, MD     History of Present Illness: .   Brendan Hopkins is a 88 y.o. male with past medical history of paroxysmal atrial fibrillation, chronic dyspnea, hypertension, TIA, DVT, COPD, former tobacco use, kidney stone and GERD. Echocardiogram in July 2022 showed normal EF, grade 1 DD, trace AI, mildly dilated aortic root. Lower extremity venous Doppler in October 2022 showed no evidence of DVT. He does have a prior history of DVT in postop setting. He has chronic dyspnea on exertion following COVID infection in 2021. He has chronic mild bilateral lower extremity edema. Myoview in March 2023 was low risk, no evidence of ischemia, EF 55 to 60%. He was diagnosed with A-fib by his PCP in August 2023 and was started on Xarelto and metoprolol. He has no cardiac awareness of A-fib. He was seen by Bernadene Person NP on 02/01/2022. EKG at the time showed that he was holding sinus rhythm with underlying right bundle branch block. It was offered for the patient to wear a 30-day heart monitor to assess the A-fib burden, however patient declined at the time. He was hospitalized in April 2024 with pneumonia and has been placed on home oxygen. He was at pulmonary rehab on 02/24/2023 and was noted to be in A-fib with RVR with heart rate of 140. EKG was personally reviewed which showed atrial flutter.  By the time I saw the patient in the office on 03/01/2023, he already self converted back to sinus rhythm.   I last saw the patient in December 2024 at which time he was still holding sinus rhythm.  He is on oxygen at night and as needed during the day.  He gets short of breath with moderate activity due to significant COPD.  I was hesitant to further increase beta-blocker dosage due to underlying COPD.  He presents today for follow-up.  He  underwent left elbow surgery in February and another debridement on 07/20/2023.  Recent olecranon bursitis surgery was complicated by wound dehiscence and significant bleeding.  He says he bled for 2 weeks.  This prompted him to stop the Xarelto.  He has been off of Xarelto for the past 4 weeks.  His elbow is finally healing up.  He plan to restart the Xarelto as soon as his elbow finish healing in the next week or two.  On physical exam, he appears to be holding sinus rhythm.  He denies any recent chest pain or shortness of breath.  He has chronic lower extremity edema that is worse by the end of the day and a consistent with a venous insufficiency.  I previously recommended leg elevation.  He can follow-up with Dr. Jens Som in 6 months.  ROS:   He denies chest pain, palpitations, dyspnea, pnd, orthopnea, n, v, dizziness, syncope, edema, weight gain, or early satiety. All other systems reviewed and are otherwise negative except as noted above.    Studies Reviewed: .        Cardiac Studies & Procedures   ______________________________________________________________________________________________   STRESS TESTS  MYOCARDIAL PERFUSION IMAGING 07/23/2021  Narrative   The study is normal. The study is low risk.   No ST deviation was noted.   LV perfusion is normal.   Left ventricular function is normal. Nuclear stress EF: 60 %. The left ventricular ejection fraction  is normal (55-65%). End diastolic cavity size is normal.   Prior study not available for comparison.   ECHOCARDIOGRAM  ECHOCARDIOGRAM COMPLETE 03/24/2023  Narrative ECHOCARDIOGRAM REPORT    Patient Name:   Brendan Hopkins Date of Exam: 03/24/2023 Medical Rec #:  [id number]         Height:       71.0 in Accession #:    [id number]        Weight:       250.0 lb Date of Birth:  1936-04-18        BSA:          2.318 m Patient Age:    86 years          BP:           114/78 mmHg Patient Gender: M                 HR:            71 bpm. Exam Location:  [address]  Procedure: 2D Echo, Cardiac Doppler and Color Doppler  Indications:    I48.0 Paroxysmal atrial fibrillation  History:        Patient has prior history of Echocardiogram examinations, most recent 12/17/2020. COPD, Arrythmias:Atrial Fibrillation and RBBB; Risk Factors:Obesity and Hypertension.  Sonographer:    Samule Ohm RDCS Referring Phys: [id number] Lizza Huffaker   Sonographer Comments: Technically difficult study due to poor echo windows and patient is obese. Image acquisition challenging due to respiratory motion, Image acquisition challenging due to COPD and Image acquisition challenging due to patient body habitus. IMPRESSIONS   1. Left ventricular ejection fraction, by estimation, is 60 to 65%. The left ventricle has normal function. There is severe concentric left ventricular hypertrophy. Left ventricular diastolic parameters are consistent with Grade I diastolic dysfunction (impaired relaxation). 2. Right ventricular systolic function is normal. The right ventricular size is mildly enlarged. 3. Left atrial size was mild to moderately dilated. 4. Trivial mitral valve regurgitation. 5. AV is thickened, calcified with restricted motion. Peak and mean gradients are 31 and 15 mm Hg respectively. AVA (VTI) is 1.91 cm2. Dimensionless index is 0.42 Overall consistent with mild aortic stenosis. Compared to echo from 2022, gradients are increased. . The aortic valve is tricuspid. Aortic valve regurgitation is mild.  FINDINGS Left Ventricle: Left ventricular ejection fraction, by estimation, is 60 to 65%. The left ventricle has normal function. The left ventricular internal cavity size was normal in size. There is severe concentric left ventricular hypertrophy. Left ventricular diastolic parameters are consistent with Grade I diastolic dysfunction (impaired relaxation).  Right Ventricle: The right ventricular size is mildly enlarged. Right vetricular  wall thickness was not assessed. Right ventricular systolic function is normal.  Left Atrium: Left atrial size was mild to moderately dilated.  Right Atrium: Right atrial size was normal in size.  Pericardium: There is no evidence of pericardial effusion.  Mitral Valve: There is mild thickening of the mitral valve leaflet(s). Trivial mitral valve regurgitation.  Tricuspid Valve: The tricuspid valve is normal in structure. Tricuspid valve regurgitation is trivial.  Aortic Valve: AV is thickened, calcified with restricted motion. Peak and mean gradients are 31 and 15 mm Hg respectively. AVA (VTI) is 1.91 cm2. Dimensionless index is 0.42 Overall consistent with mild aortic stenosis. Compared to echo from 2022, gradients are increased. The aortic valve is tricuspid. Aortic valve regurgitation is mild. Aortic regurgitation PHT measures 458 msec. Aortic valve mean gradient measures 12.8 mmHg. Aortic valve peak  gradient measures 26.4 mmHg. Aortic valve area, by VTI measures 1.74 cm.  Pulmonic Valve: The pulmonic valve was normal in structure. Pulmonic valve regurgitation is not visualized.  Aorta: The aortic root and ascending aorta are structurally normal, with no evidence of dilitation.  IAS/Shunts: No atrial level shunt detected by color flow Doppler.   LEFT VENTRICLE PLAX 2D LVIDd:         3.90 cm   Diastology LVIDs:         2.80 cm   LV e' medial:    7.76 cm/s LV PW:         1.60 cm   LV E/e' medial:  10.0 LV IVS:        2.20 cm   LV e' lateral:   9.17 cm/s LVOT diam:     2.30 cm   LV E/e' lateral: 8.4 LV SV:         83 LV SV Index:   36 LVOT Area:     4.15 cm   RIGHT VENTRICLE            IVC RVSP:           31.9 mmHg  IVC diam: 1.90 cm  LEFT ATRIUM             Index        RIGHT ATRIUM           Index LA diam:        5.20 cm 2.24 cm/m   RA Pressure: 3.00 mmHg LA Vol (A2C):   77.5 ml 33.43 ml/m  RA Area:     24.90 cm LA Vol (A4C):   92.1 ml 39.73 ml/m  RA Volume:   73.10  ml  31.53 ml/m LA Biplane Vol: 90.6 ml 39.08 ml/m AORTIC VALVE AV Area (Vmax):    1.66 cm AV Area (Vmean):   1.84 cm AV Area (VTI):     1.74 cm AV Vmax:           256.80 cm/s AV Vmean:          160.800 cm/s AV VTI:            0.475 m AV Peak Grad:      26.4 mmHg AV Mean Grad:      12.8 mmHg LVOT Vmax:         102.72 cm/s LVOT Vmean:        71.380 cm/s LVOT VTI:          0.199 m LVOT/AV VTI ratio: 0.42 AI PHT:            458 msec  AORTA Ao Root diam: 4.00 cm Ao Asc diam:  3.70 cm  MITRAL VALVE                TRICUSPID VALVE MV Area (PHT): 2.25 cm     TR Peak grad:   28.9 mmHg MV Decel Time: 338 msec     TR Vmax:        269.00 cm/s MV E velocity: 77.44 cm/s   Estimated RAP:  3.00 mmHg MV A velocity: 108.20 cm/s  RVSP:           31.9 mmHg MV E/A ratio:  0.72 SHUNTS Systemic VTI:  0.20 m Systemic Diam: 2.30 cm  Dietrich Pates MD Electronically signed by Dietrich Pates MD Signature Date/Time: 03/24/2023/5:01:08 PM    Final          ______________________________________________________________________________________________      Risk  Assessment/Calculations:    CHA2DS2-VASc Score = 3   This indicates a 3.2% annual risk of stroke. The patient's score is based upon: CHF History: 0 HTN History: 1 Diabetes History: 0 Stroke History: 0 Vascular Disease History: 0 Age Score: 2 Gender Score: 0            Physical Exam:   VS:  BP 127/80 (BP Location: Left Arm, Patient Position: Sitting, Cuff Size: Large)   Pulse 76   Ht 5\' 10"  (1.778 m)   Wt 248 lb 9.6 oz (112.8 kg)   SpO2 91%   BMI 35.67 kg/m    Wt Readings from Last 3 Encounters:  08/24/23 248 lb 9.6 oz (112.8 kg)  07/20/23 245 lb (111.1 kg)  07/01/23 254 lb 6.6 oz (115.4 kg)    GEN: Well nourished, well developed in no acute distress NECK: No JVD; No carotid bruits CARDIAC: RRR, no murmurs, rubs, gallops RESPIRATORY:  Clear to auscultation without rales, wheezing or rhonchi  ABDOMEN: Soft,  non-tender, non-distended EXTREMITIES:  No edema; No deformity   ASSESSMENT AND PLAN: .    Atrial Fibrillation Atrial fibrillation in normal sinus rhythm. Xarelto discontinued due to bleeding post-surgery. - Restart Xarelto once surgical wound healed. - Educated on anticoagulation importance and stroke vs. bleeding risks.  Hypertension: Blood pressure well-controlled  Chronic Obstructive Pulmonary Disease (COPD) Significant COPD limits activity with dyspnea on exertion. Uses home oxygen at night and as needed. - Continue home oxygen at 2 liters at night. - Encourage pulmonary rehabilitation as tolerated.        Dispo: Follow-up with Dr. Jens Som in 6 months  Signed, Azalee Course, [place]

## 2023-08-24 NOTE — Patient Instructions (Addendum)
 Medication Instructions:  NO CHANGES *If you need a refill on your cardiac medications before your next appointment, please call your pharmacy*  Lab Work: NO LABS If you have labs (blood work) drawn today and your tests are completely normal, you will receive your results only by: MyChart Message (if you have MyChart) OR A paper copy in the mail If you have any lab test that is abnormal or we need to change your treatment, we will call you to review the results.  Testing/Procedures: NO TESTING  Follow-Up: At [facility], you and your health needs are our priority.  As part of our continuing mission to provide you with exceptional heart care, our providers are all part of one team.  This team includes your primary Cardiologist (physician) and Advanced Practice Providers or APPs (Physician Assistants and Nurse Practitioners) who all work together to provide you with the care you need, when you need it.  Your next appointment:   6 month(s)  Provider:   Olga Millers, MD   We recommend signing up for the patient portal called "MyChart".  Sign up information is provided on this After Visit Summary.  MyChart is used to connect with patients for Virtual Visits (Telemedicine).  Patients are able to view lab/test results, encounter notes, upcoming appointments, etc.  Non-urgent messages can be sent to your provider as well.   To learn more about what you can do with MyChart, go to [email or web address].   Other Instructions   1st Floor: - Lobby - Registration  - Pharmacy  - Lab - Cafe  2nd Floor: - PV Lab - Diagnostic Testing (echo, CT, nuclear med)  3rd Floor: - Vacant  4th Floor: - TCTS (cardiothoracic surgery) - AFib Clinic - Structural Heart Clinic - Vascular Surgery  - Vascular Ultrasound  5th Floor: - [facility]Care Cardiology (general and EP) - Clinical Pharmacy for coumadin, hypertension, lipid, weight-loss medications, and med management  appointments    Valet parking services will be available as well.

## 2023-08-25 ENCOUNTER — Ambulatory Visit: Payer: Self-pay

## 2023-08-25 ENCOUNTER — Ambulatory Visit: Payer: Self-pay | Admitting: Family Medicine

## 2023-08-25 NOTE — Telephone Encounter (Signed)
 1st attempt to call patient, left voicemail message to call back.   Copied from CRM [id number]. Topic: Clinical - Medical Advice >> Aug 25, 2023  9:19 AM Higinio Roger wrote: Reason for CRM: Patient is requesting a callback to discuss Measles outbreak. He is wondering if he needs a booster shot. He also stated he believes he had one when he was younger.  Callback #: [phone]

## 2023-08-25 NOTE — Telephone Encounter (Signed)
 Pt states he saw on the news last night how bad the measles outbreak is getting. Pt states he saw where if you were born between 1960-65 you may need a booster. Pt is wondering if he needs a measles booster vaccine. This RN let pt know a message will be sent to his PCP and someone from the clinic will call him back on his provider's recommendation.   This RN confirmed best call back number is [phone]   Copied from CRM [id number]. Topic: Clinical - Medical Advice >> Aug 25, 2023  9:19 AM Higinio Roger wrote: Reason for CRM: Patient is requesting a callback to discuss Measles outbreak. He is wondering if he needs a booster shot. He also stated he believes he had one when he was younger.  Callback #: [phone] Reason for Disposition  [1] Caller requesting NON-URGENT health information AND [2] PCP's office is the best resource  Answer Assessment - Initial Assessment Questions 1. REASON FOR CALL or QUESTION: "What is your reason for calling today?" or "How can I best help you?" or "What question do you have that I can help answer?"     Recommendation on measles booster vaccine  Protocols used: Information Only Call - No Triage-A-AH

## 2023-08-25 NOTE — Telephone Encounter (Signed)
 3rd attempt to contact patient, call was connected but no caller on the line, loud music heard. Routing for follow up.

## 2023-08-25 NOTE — Telephone Encounter (Signed)
 2nd attempt, voicemail left to return call.

## 2023-08-26 ENCOUNTER — Other Ambulatory Visit: Payer: Self-pay | Admitting: Family Medicine

## 2023-08-26 ENCOUNTER — Telehealth: Payer: Self-pay

## 2023-08-26 NOTE — Telephone Encounter (Signed)
 Medication already requested by pharmacy in a separate refill encounter.

## 2023-08-26 NOTE — Telephone Encounter (Signed)
 Requested medication (s) are due for refill today: Yes  Requested medication (s) are on the active medication list: Yes  Last refill:  07/25/23  Future visit scheduled: No  Notes to clinic:  Unable to refill per protocol, cannot delegate.      Requested Prescriptions  Pending Prescriptions Disp Refills   meclizine (ANTIVERT) 25 MG tablet [Pharmacy Med Name: Meclizine HCl 25 MG Oral Tablet] 30 tablet 0    Sig: Take 1 tablet by mouth once daily     Not Delegated - Gastroenterology: Antiemetics Failed - 08/26/2023  3:30 PM      Failed - This refill cannot be delegated      Passed - Valid encounter within last 6 months    Recent Outpatient Visits           5 months ago Chronic obstructive pulmonary disease, unspecified COPD type (HCC)   [facility] Pickard, Priscille Heidelberg, MD   11 months ago Bilateral impacted cerumen   [facility] Donita Brooks, MD   11 months ago Recurrent pneumonia   [place] [facility] Tanya Nones, Priscille Heidelberg, MD   1 year ago Burning mouth syndrome   [facility] Park Meo, FNP   1 year ago Acute cough    [facility] Pickard, Priscille Heidelberg, MD

## 2023-08-26 NOTE — Telephone Encounter (Signed)
 Prescription Request  08/26/2023  LOV:03/21/23  What is the name of the medication or equipment? meclizine (ANTIVERT) 25 MG tablet [[id number]]   Have you contacted your pharmacy to request a refill? Yes   Which pharmacy would you like this sent to?  [id number] - [place] ([place] - [address] [place]) [place] [address] Phone: [phone] Fax: [phone]    Patient notified that their request is being sent to the clinical staff for review and that they should receive a response within 2 business days.   Please advise at [facility] [phone]

## 2023-08-29 ENCOUNTER — Other Ambulatory Visit

## 2023-08-29 ENCOUNTER — Telehealth: Payer: Self-pay

## 2023-08-29 ENCOUNTER — Telehealth: Payer: Self-pay | Admitting: Family Medicine

## 2023-08-29 DIAGNOSIS — Z1159 Encounter for screening for other viral diseases: Secondary | ICD-10-CM | POA: Diagnosis not present

## 2023-08-29 NOTE — Telephone Encounter (Signed)
 Copied from CRM [id number]. Topic: Clinical - Medical Advice >> Aug 25, 2023  9:19 AM Higinio Roger wrote: Reason for CRM: Patient is requesting a callback to discuss Measles outbreak. He is wondering if he needs a booster shot. He also stated he believes he had one when he was younger.  Callback #: [phone]

## 2023-08-29 NOTE — Telephone Encounter (Signed)
 Copied from CRM [id number]. Topic: Clinical - Medical Advice >> Aug 25, 2023  9:19 AM Higinio Roger wrote: Reason for CRM: Patient is requesting a callback to discuss Measles outbreak. He is wondering if he needs a booster shot. He also stated he believes he had one when he was younger.  Callback #: [phone] >> Aug 29, 2023  9:01 AM Lilyan Gilford wrote: Routed message to patient's nurse.  >> Aug 26, 2023 11:53 AM Emylou G wrote: Patient missed call.. pls return call back about measles?  [phone]

## 2023-08-30 LAB — MEASLES/MUMPS/RUBELLA IMMUNITY
Mumps IgG: 36.8 [AU]/ml
Rubella: 6.62 {index}
Rubeola IgG: >300 [AU]/ml

## 2023-08-31 DIAGNOSIS — J449 Chronic obstructive pulmonary disease, unspecified: Secondary | ICD-10-CM | POA: Diagnosis not present

## 2023-08-31 DIAGNOSIS — N1831 Chronic kidney disease, stage 3a: Secondary | ICD-10-CM | POA: Diagnosis not present

## 2023-09-02 DIAGNOSIS — J449 Chronic obstructive pulmonary disease, unspecified: Secondary | ICD-10-CM | POA: Diagnosis not present

## 2023-09-05 ENCOUNTER — Other Ambulatory Visit: Payer: Self-pay | Admitting: Family Medicine

## 2023-09-05 NOTE — Telephone Encounter (Unsigned)
 Copied from CRM [id number]. Topic: Clinical - Medication Refill >> Sep 05, 2023 11:07 AM Alpha Arts wrote: Most Recent Primary Care Visit:  Provider: [facility]  Department: [facility]  Visit Type: LAB  Date: 08/29/2023  Medication: albuterol (VENTOLIN HFA) 108 (90 Base) MCG/ACT inhaler   Has the patient contacted their pharmacy? Yes (Agent: If no, request that the patient contact the pharmacy for the refill. If patient does not wish to contact the pharmacy document the reason why and proceed with request.) (Agent: If yes, when and what did the pharmacy advise?) [facility] called to request refill  Is this the correct pharmacy for this prescription? Yes If no, delete pharmacy and type the correct one.  This is the patient's preferred pharmacy:   [facility] - [place], [place] - [address] [place] [address] Phone: [phone] Fax: [phone]  Has the prescription been filled recently? Yes  Is the patient out of the medication? Yes  Has the patient been seen for an appointment in the last year OR does the patient have an upcoming appointment? Yes  Can we respond through MyChart? No  Agent: Please be advised that Rx refills may take up to 3 business days. We ask that you follow-up with your pharmacy.

## 2023-09-06 MED ORDER — ALBUTEROL SULFATE HFA 108 (90 BASE) MCG/ACT IN AERS: INHALATION_SPRAY | RESPIRATORY_TRACT | 2 refills | Status: DC

## 2023-09-06 NOTE — Telephone Encounter (Signed)
 Requested by patient . Protocols met. Requested Prescriptions  Pending Prescriptions Disp Refills   albuterol (VENTOLIN HFA) 108 (90 Base) MCG/ACT inhaler 6.7 each 2    Sig: Inhale 1 to 2 puffs by mouth every 6 hours as needed for shortness of breath     Pulmonology:  Beta Agonists 2 Passed - 09/06/2023  1:25 PM      Passed - Last BP in normal range    BP Readings from Last 1 Encounters:  08/24/23 127/80         Passed - Last Heart Rate in normal range    Pulse Readings from Last 1 Encounters:  08/24/23 76         Passed - Valid encounter within last 12 months    Recent Outpatient Visits           5 months ago Chronic obstructive pulmonary disease, unspecified COPD type (HCC)   [place] [facility] Pickard, Cisco Crest, MD   11 months ago Bilateral impacted cerumen   [place] [facility] Austine Lefort, MD   12 months ago Recurrent pneumonia   [facility] Cheril Cork, Cisco Crest, MD   1 year ago Burning mouth syndrome   [place] [facility] Jenelle Mis, FNP   1 year ago Acute cough    [facility] Pickard, Cisco Crest, MD

## 2023-09-09 ENCOUNTER — Ambulatory Visit: Payer: Self-pay

## 2023-09-09 NOTE — Telephone Encounter (Signed)
 Chief Complaint: Arm Injury Symptoms: Open wound Frequency: Today Pertinent Negatives: Patient denies fever, pain, head injury Disposition: [] ED /[x] Urgent Care (no appt availability in office) / [] Appointment(In office/virtual)/ []  [facility]/ [] Home Care/ [] Refused Recommended Disposition /[]  Mobile Bus/ []  Follow-up with PCP Additional Notes: Pt reports he was trimming a tree this morning and when he cut a branch loose it came down too quickly and knocked him down. Pt reports open wound to left arm/right hand. Notes his neighbor helped him clean it and get the bleeding stopped. No OV availability. Pt to be seen at [facility]. This RN educated pt on home care, new-worsening symptoms, when to call back/seek emergent care. Pt verbalized understanding and agrees to plan.    Copied from CRM [id number]. Topic: Clinical - Red Word Triage >> Sep 09, 2023 11:01 AM DeAngela L wrote: Red Word that prompted transfer to Nurse Triage: Patient tree fell on him and knocked him and his chain saw down and the patient said he got the bleeding to stop but needs a professional to look at it Reason for Disposition  [1] No prior tetanus shots (or is not fully vaccinated) AND [2] any wound (e.g., cut or scrape)  Answer Assessment - Initial Assessment Questions 1. MECHANISM: "How did the fall happen?"     Fall while trimming a tree, branch knocked him down 3. ONSET: "When did the fall happen?" (e.g., minutes, hours, or days ago)     Today 4. LOCATION: "What part of the body hit the ground?" (e.g., back, buttocks, head, hips, knees, hands, head, stomach)     Left side 5. INJURY: "Did you hurt (injure) yourself when you fell?" If Yes, ask: "What did you injure? Tell me more about this?" (e.g., body area; type of injury; pain severity)"     Yes, left lower arm/right hand open wounds 6. PAIN: "Is there any pain?" If Yes, ask: "How bad is the pain?" (e.g., Scale 1-10; or mild,  moderate, severe)   -  NONE (0): No pain   - MILD (1-3): Doesn't interfere with normal activities    - MODERATE (4-7): Interferes with normal activities or awakens from sleep    - SEVERE (8-10): Excruciating pain, unable to do any normal activities      None 9. OTHER SYMPTOMS: "Do you have any other symptoms?" (e.g., dizziness, fever, weakness; new onset or worsening).      None 10. CAUSE: "What do you think caused the fall (or falling)?" (e.g., tripped, dizzy spell)       Tree came down too fast  Protocols used: Falls and [facility]

## 2023-09-10 DIAGNOSIS — N1831 Chronic kidney disease, stage 3a: Secondary | ICD-10-CM | POA: Diagnosis not present

## 2023-09-10 DIAGNOSIS — J449 Chronic obstructive pulmonary disease, unspecified: Secondary | ICD-10-CM | POA: Diagnosis not present

## 2023-09-16 ENCOUNTER — Other Ambulatory Visit: Payer: Self-pay

## 2023-09-16 ENCOUNTER — Other Ambulatory Visit: Payer: Self-pay | Admitting: Family Medicine

## 2023-09-16 DIAGNOSIS — M5432 Sciatica, left side: Secondary | ICD-10-CM

## 2023-09-16 MED ORDER — GABAPENTIN 300 MG PO CAPS: 300.0000 mg | ORAL_CAPSULE | Freq: Three times a day (TID) | ORAL | 1 refills | Status: DC

## 2023-09-16 NOTE — Telephone Encounter (Signed)
 Copied from CRM [id number]. Topic: Clinical - Medication Refill >> Sep 16, 2023  2:02 PM Lotus Round B wrote: Most Recent Primary Care Visit:  Provider: [facility]  [facility]: [facility]  Visit Type: LAB  Date: 08/29/2023  Medication: gabapentin  (NEURONTIN ) 300 MG capsule  Has the patient contacted their pharmacy? Yes (Agent: If no, request that the patient contact the pharmacy for the refill. If patient does not wish to contact the pharmacy document the reason why and proceed with request.) (Agent: If yes, when and what did the pharmacy advise?)  Is this the correct pharmacy for this prescription? Yes If no, delete pharmacy and type the correct one.  This is the patient's preferred pharmacy:  [id number] - [place] - [address] [place])  [address] Phone: [phone] Fax: [phone]    Has the prescription been filled recently? No  Is the patient out of the medication? No  Has the patient been seen for an appointment in the last year OR does the patient have an upcoming appointment? Yes  Can we respond through MyChart? No  Agent: Please be advised that Rx refills may take up to 3 business days. We ask that you follow-up with your pharmacy.

## 2023-09-25 ENCOUNTER — Other Ambulatory Visit: Payer: Self-pay | Admitting: Family Medicine

## 2023-09-28 ENCOUNTER — Ambulatory Visit (HOSPITAL_BASED_OUTPATIENT_CLINIC_OR_DEPARTMENT_OTHER): Admitting: Pulmonary Disease

## 2023-09-29 ENCOUNTER — Other Ambulatory Visit: Payer: Self-pay | Admitting: Family Medicine

## 2023-09-30 DIAGNOSIS — N1831 Chronic kidney disease, stage 3a: Secondary | ICD-10-CM | POA: Diagnosis not present

## 2023-09-30 DIAGNOSIS — J449 Chronic obstructive pulmonary disease, unspecified: Secondary | ICD-10-CM | POA: Diagnosis not present

## 2023-10-02 DIAGNOSIS — N1831 Chronic kidney disease, stage 3a: Secondary | ICD-10-CM | POA: Diagnosis not present

## 2023-10-02 DIAGNOSIS — J449 Chronic obstructive pulmonary disease, unspecified: Secondary | ICD-10-CM | POA: Diagnosis not present

## 2023-10-03 NOTE — Progress Notes (Unsigned)
 @Patient  ID: Brendan Hopkins, male    DOB: 10-Aug-1935, 88 y.o.   MRN: [id number]  No chief complaint on file.   Referring provider: Austine Lefort, MD  HPI: 88 year old male, former smoker followed for COPD and chronic respiratory failure. He is a patient of Dr. Jacqualyn Mates and last seen in office 06/09/2023. Past medical history significant for hx of CVA, hx of DVT, PAF on Xarelto , HTN, GERD, CKD, HLD, insomnia.   TEST/EVENTS:   06/09/2023: OV with Dr. Washington Hacker. Compliant with Trelegy. Feels it has done well. SOB unchanged. Coughs throughout the day and once he is able to produce sputum, feels better. Nighttime cough worse. Will use albuterol . Prior to bedtime. Compliant with oxygen  at night but does not consistently wear the oxygen  during the day. Completed pulm rehab fall 2024. Recommend wearing 3 lpm POC O2 with activity. Ambulatory O2 at follow up.     No Known Allergies  Immunization History  Administered Date(s) Administered   Fluad Quad(high Dose 65+) 01/25/2019, 02/21/2020, 02/10/2021   Influenza Split 02/15/2011   Influenza, High Dose Seasonal PF 02/17/2018   Influenza,inj,Quad PF,6+ Mos 04/09/2013, 02/12/2014, 03/14/2015, 03/04/2016, 03/02/2017   Influenza-Unspecified 01/27/2023   PFIZER(Purple Top)SARS-COV-2 Vaccination 08/16/2019, 09/06/2019, 03/17/2020, 11/07/2020   PNEUMOCOCCAL CONJUGATE-20 10/17/2022   Pfizer Covid-19 Vaccine Bivalent Booster 63yrs & up 03/24/2021   Pneumococcal Conjugate-13 04/09/2013   Pneumococcal Polysaccharide-23 12/19/2014   Td 09/10/2003   Zoster Recombinant(Shingrix) 11/07/2020    Past Medical History:  Diagnosis Date   A-fib (HCC)    Acute deep vein thrombosis (DVT) of tibial vein of right lower extremity (HCC)    after ankle fracture/surgery (2/21)   Arthritis    RIGHT HIP, HANDS, BACK   Cancer (HCC)    Skin cancer - face, neck and back   Cataract    bilateral - removed   Cervical radiculopathy    severe C3-4 neuroforaminal  stenosis, C5-6 left synovial cyst compressing left nerve root.   Chronic kidney disease    stage 3a   Colon polyps    2005   COPD (chronic obstructive pulmonary disease) (HCC)    SMOKED FOR 38 YRS   Cystoid macular degeneration of both eyes    Diverticulosis    2005   Fatty liver    GERD (gastroesophageal reflux disease)    Hard of hearing    has hearing aids   History of kidney stones    Hypertension    Leg fracture, right    Oxygen  dependent    2L via [place].  Patient wears oxygen  at night and when need during day   Pain    RIGHT HIP AND BACK   Personal history of colonic polyps-adenoma and sessile serrated polyp 07/15/2010   Pneumonia 03/2019   x several   RBBB    HX AFIB IN PAST- PAC'S   Shortness of breath    WITH EXERTION ONLY   Stroke (HCC)    TIA hx   Wears glasses     Tobacco History: Social History   Tobacco Use  Smoking Status Former   Current packs/day: 0.00   Average packs/day: 2.0 packs/day for 57.0 years (114.0 ttl pk-yrs)   Types: Cigarettes   Start date: 05/24/1950   Quit date: 05/25/2007   Years since quitting: 16.3  Smokeless Tobacco Never  Tobacco Comments   Does not qualify due to age.    Counseling given: Not Answered Tobacco comments: Does not qualify due to age.    Outpatient  Medications Prior to Visit  Medication Sig Dispense Refill   acetaminophen  (TYLENOL ) 650 MG CR tablet Take 650 mg by mouth 3 (three) times daily as needed for pain.     albuterol  (VENTOLIN  HFA) 108 (90 Base) MCG/ACT inhaler Inhale 1 to 2 puffs by mouth every 6 hours as needed for shortness of breath 6.7 each 2   amLODipine  (NORVASC ) 5 MG tablet Take 1 tablet by mouth at bedtime 30 tablet 11   ciprofloxacin  (CIPRO ) 500 MG tablet Take 1 tablet (500 mg total) by mouth 2 (two) times daily. (Patient not taking: Reported on 08/24/2023) 30 tablet 0   colchicine  0.6 MG tablet Take 1 tablet (0.6 mg total) by mouth daily. 30 tablet 2   Fluticasone -Umeclidin-Vilant (TRELEGY ELLIPTA )  200-62.5-25 MCG/ACT AEPB Inhale 1 puff into the lungs daily. 60 each 5   furosemide  (LASIX ) 40 MG tablet Take 1 tablet by mouth once daily 30 tablet 11   gabapentin  (NEURONTIN ) 300 MG capsule Take 1 capsule (300 mg total) by mouth 3 (three) times daily. TAKE 1 CAPSULE BY MOUTH THREE TIMES DAILY AS NEEDED FOR  SCIATICA 270 capsule 1   Iron , Ferrous Sulfate , 325 (65 Fe) MG TABS Take 325 mg by mouth daily. 30 tablet 1   meclizine  (ANTIVERT ) 25 MG tablet Take 1 tablet by mouth once daily 30 tablet 0   metoprolol  succinate (TOPROL -XL) 25 MG 24 hr tablet Take 1 tablet by mouth once daily 90 tablet 0   Multiple Vitamin (MULTIVITAMIN WITH MINERALS) TABS Take 1 tablet by mouth daily.     Omega-3 Fatty Acids (FISH OIL) 1200 MG CAPS Take 1,200 mg by mouth every evening.     oxyCODONE -acetaminophen  (PERCOCET/ROXICET) 5-325 MG tablet Take 1 tablet by mouth every 4 (four) hours as needed. 30 tablet 0   OXYGEN  Inhale 2 L into the lungs daily as needed (Shortness of breath).     pantoprazole  (PROTONIX ) 40 MG tablet Take 1 tablet by mouth every day 30 tablet 11   rivaroxaban  (XARELTO ) 20 MG TABS tablet Take 1 tablet (20 mg total) by mouth daily. 90 tablet 1   Specialty Vitamins Products (MEMORY COMPLEX BRAIN HEALTH PO) Take 1 tablet by mouth daily.     zolpidem  (AMBIEN ) 10 MG tablet Take 1 tablet by mouth at bedtime as needed for sleep 30 tablet 5   No facility-administered medications prior to visit.     Review of Systems:   Constitutional: No weight loss or gain, night sweats, fevers, chills, fatigue, or lassitude. HEENT: No headaches, difficulty swallowing, tooth/dental problems, or sore throat. No sneezing, itching, ear ache, nasal congestion, or post nasal drip CV:  No chest pain, orthopnea, PND, swelling in lower extremities, anasarca, dizziness, palpitations, syncope Resp: No shortness of breath with exertion or at rest. No excess mucus or change in color of mucus. No productive or non-productive. No  hemoptysis. No wheezing.  No chest wall deformity GI:  No heartburn, indigestion, abdominal pain, nausea, vomiting, diarrhea, change in bowel habits, loss of appetite, bloody stools.  GU: No dysuria, change in color of urine, urgency or frequency.  No flank pain, no hematuria  Skin: No rash, lesions, ulcerations MSK:  No joint pain or swelling.  No decreased range of motion.  No back pain. Neuro: No dizziness or lightheadedness.  Psych: No depression or anxiety. Mood stable.     Physical Exam:  There were no vitals taken for this visit.  GEN: Pleasant, interactive, well-nourished/chronically-ill appearing/acutely-ill appearing/poorly-nourished/morbidly obese; in no acute distress.****** HEENT:  Normocephalic and atraumatic. EACs patent bilaterally. TM pearly gray with present light reflex bilaterally. PERRLA. Sclera white. Nasal turbinates pink, moist and patent bilaterally. No rhinorrhea present. Oropharynx pink and moist, without exudate or edema. No lesions, ulcerations, or postnasal drip.  NECK:  Supple w/ fair ROM. No JVD present. Normal carotid impulses w/o bruits. Thyroid  symmetrical with no goiter or nodules palpated. No lymphadenopathy.   CV: RRR, no m/r/g, no peripheral edema. Pulses intact, +2 bilaterally. No cyanosis, pallor or clubbing. PULMONARY:  Unlabored, regular breathing. Clear bilaterally A&P w/o wheezes/rales/rhonchi. No accessory muscle use.  GI: BS present and normoactive. Soft, non-tender to palpation. No organomegaly or masses detected. No CVA tenderness. MSK: No erythema, warmth or tenderness. Cap refil <2 sec all extrem. No deformities or joint swelling noted.  Neuro: A/Ox3. No focal deficits noted.   Skin: Warm, no lesions or rashe Psych: Normal affect and behavior. Judgement and thought content appropriate.     Lab Results:  CBC    Component Value Date/Time   WBC 5.1 07/01/2023 1724   RBC 4.16 (L) 07/01/2023 1724   HGB 13.3 07/01/2023 1724   HCT 40.4  07/01/2023 1724   PLT 252 07/01/2023 1724   MCV 97.1 07/01/2023 1724   MCH 32.0 07/01/2023 1724   MCHC 32.9 07/01/2023 1724   RDW 13.5 07/01/2023 1724   LYMPHSABS 0.7 07/01/2023 1724   MONOABS 0.1 07/01/2023 1724   EOSABS 0.0 07/01/2023 1724   BASOSABS 0.0 07/01/2023 1724    BMET    Component Value Date/Time   NA 138 07/01/2023 0642   NA 140 02/01/2022 1422   K 4.2 07/01/2023 0642   CL 104 07/01/2023 0642   CO2 23 07/01/2023 0642   GLUCOSE 118 (H) 07/01/2023 0642   BUN 18 07/01/2023 0642   BUN 18 02/01/2022 1422   CREATININE 1.15 07/01/2023 0642   CREATININE 1.36 (H) 06/22/2022 1038   CALCIUM 9.3 07/01/2023 0642   GFRNONAA >60 07/01/2023 0642   GFRNONAA 67 05/06/2020 1246   GFRAA 77 05/06/2020 1246    BNP    Component Value Date/Time   BNP 259.0 (H) 08/27/2022 2036   BNP 59 05/06/2020 1246     Imaging:  No results found.  Administration History     None          Latest Ref Rng & Units 12/13/2022    8:03 AM  PFT Results  FVC-Pre L 2.73   FVC-Predicted Pre % 77   FVC-Post L 2.82   FVC-Predicted Post % 80   Pre FEV1/FVC % % 76   Post FEV1/FCV % % 75   FEV1-Pre L 2.08   FEV1-Predicted Pre % 84   FEV1-Post L 2.10   DLCO uncorrected ml/min/mmHg 9.90   DLCO UNC% % 44   DLVA Predicted % 60   TLC L 4.81   TLC % Predicted % 72   RV % Predicted % 58     No results found for: "NITRICOXIDE"      Assessment & Plan:   No problem-specific Assessment & Plan notes found for this encounter.   Advised if symptoms do not improve or worsen, to please contact office for sooner follow up or seek emergency care.   I spent *** minutes of dedicated to the care of this patient on the date of this encounter to include pre-visit review of records, face-to-face time with the patient discussing conditions above, post visit ordering of testing, clinical documentation with the electronic health record, making appropriate referrals  as documented, and communicating  necessary findings to members of the patients care team.  Roetta Clarke, NP 10/03/2023  Pt aware and understands NP's role.

## 2023-10-04 ENCOUNTER — Encounter (HOSPITAL_BASED_OUTPATIENT_CLINIC_OR_DEPARTMENT_OTHER): Payer: Self-pay | Admitting: Nurse Practitioner

## 2023-10-04 ENCOUNTER — Ambulatory Visit (HOSPITAL_BASED_OUTPATIENT_CLINIC_OR_DEPARTMENT_OTHER): Admitting: Nurse Practitioner

## 2023-10-04 VITALS — BP 140/69 | HR 58 | Ht 70.0 in | Wt 242.0 lb

## 2023-10-04 DIAGNOSIS — J439 Emphysema, unspecified: Secondary | ICD-10-CM | POA: Diagnosis not present

## 2023-10-04 DIAGNOSIS — I480 Paroxysmal atrial fibrillation: Secondary | ICD-10-CM | POA: Diagnosis not present

## 2023-10-04 DIAGNOSIS — J411 Mucopurulent chronic bronchitis: Secondary | ICD-10-CM

## 2023-10-04 DIAGNOSIS — H6120 Impacted cerumen, unspecified ear: Secondary | ICD-10-CM | POA: Insufficient documentation

## 2023-10-04 DIAGNOSIS — R918 Other nonspecific abnormal finding of lung field: Secondary | ICD-10-CM | POA: Diagnosis not present

## 2023-10-04 DIAGNOSIS — J9611 Chronic respiratory failure with hypoxia: Secondary | ICD-10-CM | POA: Diagnosis not present

## 2023-10-04 DIAGNOSIS — Z87891 Personal history of nicotine dependence: Secondary | ICD-10-CM

## 2023-10-04 DIAGNOSIS — J4489 Other specified chronic obstructive pulmonary disease: Secondary | ICD-10-CM

## 2023-10-04 DIAGNOSIS — R29898 Other symptoms and signs involving the musculoskeletal system: Secondary | ICD-10-CM | POA: Diagnosis not present

## 2023-10-04 DIAGNOSIS — H6122 Impacted cerumen, left ear: Secondary | ICD-10-CM | POA: Diagnosis not present

## 2023-10-04 NOTE — Assessment & Plan Note (Signed)
 Suboptimal compliance with oxygen  for exertional hypoxia. He does wear his oxygen  nightly. Suspect a component of his LE weakness and difficulties with stamina are related to hypoxia. Educated on importance of treating with supplemental oxygen  and risks of untreated hypoxia. Encouraged him to monitor oxygen  levels for goal >88-90% and utilize POC to maintain above this. Will reassess at follow up. Stable today on room air with minimal exertion.

## 2023-10-04 NOTE — Patient Instructions (Signed)
 Continue Albuterol  inhaler 2 puffs every 6 hours as needed for shortness of breath or wheezing. Notify if symptoms persist despite rescue inhaler/neb use.  Continue Trelegy 1 puff daily. Brush tongue and rinse mouth afterwards Continue supplemental oxygen  3 lpm POC with activity and 2 lpm at night. Goal >88-90%. Try to be more consistent with oxygen  therapy, especially with exercise and longer distances. This will help with your stamina   Use guaifenesin (mucinex or similar) over the counter [phone] mg Twice daily for cough/congestion. This should help loosen up your cough some  I also ordered a flutter valve that you can use twice a day, 10 times if needed to help clear out the cough  Repeat CT chest in 6 months for 18 month follow up  Follow up in 4-6 months with Dr. Washington Hacker. If symptoms do not improve or worsen, please contact office for sooner follow up or seek emergency care.

## 2023-10-04 NOTE — Assessment & Plan Note (Signed)
 Stable on prior CT 09/2022. Former heavy smoker with 114 pack year history. Does not qualify for lung cancer screening program due to age. Order placed for CT chest in 6 months for 18 month follow up based on recommendations.

## 2023-10-04 NOTE — Assessment & Plan Note (Signed)
 COPD with chronic bronchitis. Not in acute exacerbation. Difficulties expectorating sputum at times. Advised on mucociliary clearance therapies. If he continues to have difficulties, could consider inhaled phosphodiesterase inhibitor with Ohtuvayre . Continue triple therapy regimen. Action plan in place.  Patient Instructions  Continue Albuterol  inhaler 2 puffs every 6 hours as needed for shortness of breath or wheezing. Notify if symptoms persist despite rescue inhaler/neb use.  Continue Trelegy 1 puff daily. Brush tongue and rinse mouth afterwards Continue supplemental oxygen  3 lpm POC with activity and 2 lpm at night. Goal >88-90%. Try to be more consistent with oxygen  therapy, especially with exercise and longer distances. This will help with your stamina   Use guaifenesin (mucinex or similar) over the counter [phone] mg Twice daily for cough/congestion. This should help loosen up your cough some  I also ordered a flutter valve that you can use twice a day, 10 times if needed to help clear out the cough  Repeat CT chest in 6 months for 18 month follow up  Follow up in 4-6 months with Dr. Washington Hacker. If symptoms do not improve or worsen, please contact office for sooner follow up or seek emergency care.

## 2023-10-04 NOTE — Assessment & Plan Note (Signed)
 Sinus rhythm today. Continue management with cardiology

## 2023-10-04 NOTE — Assessment & Plan Note (Signed)
 Advised to use OTC ear wax removal drops and follow up with PCP if problem persists

## 2023-10-04 NOTE — Assessment & Plan Note (Signed)
 Bilateral but L>R. Likely multifactorial. Chronic in nature. No acute worsening. See above. Encouraged him to discuss with his orthopedist as well. Fall precautions reviewed.

## 2023-10-05 DIAGNOSIS — J4489 Other specified chronic obstructive pulmonary disease: Secondary | ICD-10-CM | POA: Diagnosis not present

## 2023-10-10 DIAGNOSIS — J449 Chronic obstructive pulmonary disease, unspecified: Secondary | ICD-10-CM | POA: Diagnosis not present

## 2023-10-10 DIAGNOSIS — N1831 Chronic kidney disease, stage 3a: Secondary | ICD-10-CM | POA: Diagnosis not present

## 2023-10-12 ENCOUNTER — Other Ambulatory Visit: Payer: Self-pay | Admitting: Family Medicine

## 2023-10-13 NOTE — Telephone Encounter (Signed)
 Requested Prescriptions  Pending Prescriptions Disp Refills   metoprolol  succinate (TOPROL -XL) 25 MG 24 hr tablet [Pharmacy Med Name: Metoprolol  Succinate ER 25 MG Oral Tablet Extended Release 24 Hour] 90 tablet 0    Sig: Take 1 tablet (25 mg total) by mouth daily. OFFICE VISIT NEEDED FOR ADDITIONAL REFILLS     Cardiovascular:  Beta Blockers Failed - 10/13/2023  3:51 PM      Failed - Last BP in normal range    BP Readings from Last 1 Encounters:  10/04/23 (!) 140/69         Failed - Valid encounter within last 6 months    Recent Outpatient Visits           6 months ago Chronic obstructive pulmonary disease, unspecified COPD type (HCC)   [place] [facility] Pickard, Cisco Crest, MD   1 year ago Bilateral impacted cerumen   [facility] Austine Lefort, MD   1 year ago Recurrent pneumonia    [facility] Austine Lefort, MD   1 year ago Burning mouth syndrome    [facility] Jenelle Mis, FNP   1 year ago Acute cough    [facility] Austine Lefort, MD              Passed - Last Heart Rate in normal range    Pulse Readings from Last 1 Encounters:  10/04/23 (!) 58

## 2023-10-13 NOTE — Telephone Encounter (Signed)
 Need to call patient to schedule appt

## 2023-10-17 ENCOUNTER — Other Ambulatory Visit: Payer: Self-pay | Admitting: Family Medicine

## 2023-10-18 DIAGNOSIS — L578 Other skin changes due to chronic exposure to nonionizing radiation: Secondary | ICD-10-CM | POA: Diagnosis not present

## 2023-10-18 DIAGNOSIS — D225 Melanocytic nevi of trunk: Secondary | ICD-10-CM | POA: Diagnosis not present

## 2023-10-18 DIAGNOSIS — L821 Other seborrheic keratosis: Secondary | ICD-10-CM | POA: Diagnosis not present

## 2023-10-18 DIAGNOSIS — L570 Actinic keratosis: Secondary | ICD-10-CM | POA: Diagnosis not present

## 2023-10-18 DIAGNOSIS — Z85828 Personal history of other malignant neoplasm of skin: Secondary | ICD-10-CM | POA: Diagnosis not present

## 2023-10-18 DIAGNOSIS — L814 Other melanin hyperpigmentation: Secondary | ICD-10-CM | POA: Diagnosis not present

## 2023-10-18 DIAGNOSIS — Z08 Encounter for follow-up examination after completed treatment for malignant neoplasm: Secondary | ICD-10-CM | POA: Diagnosis not present

## 2023-10-23 ENCOUNTER — Other Ambulatory Visit: Payer: Self-pay | Admitting: Family Medicine

## 2023-10-24 ENCOUNTER — Telehealth: Payer: Self-pay | Admitting: Family Medicine

## 2023-10-24 NOTE — Telephone Encounter (Signed)
 Copied from CRM [id number]. Topic: General - Other >> Oct 24, 2023 10:01 AM Alpha Arts wrote: Reason for CRM: Mat from [employer] would like to know if the orders for oxygen  equipment  was received via fax on 10/21/23  Callback #: [phone] Fax #: [phone]

## 2023-10-25 ENCOUNTER — Other Ambulatory Visit: Payer: Self-pay | Admitting: Family Medicine

## 2023-10-31 DIAGNOSIS — N1831 Chronic kidney disease, stage 3a: Secondary | ICD-10-CM | POA: Diagnosis not present

## 2023-10-31 DIAGNOSIS — J449 Chronic obstructive pulmonary disease, unspecified: Secondary | ICD-10-CM | POA: Diagnosis not present

## 2023-11-02 DIAGNOSIS — J449 Chronic obstructive pulmonary disease, unspecified: Secondary | ICD-10-CM | POA: Diagnosis not present

## 2023-11-02 DIAGNOSIS — N1831 Chronic kidney disease, stage 3a: Secondary | ICD-10-CM | POA: Diagnosis not present

## 2023-11-09 ENCOUNTER — Ambulatory Visit: Admitting: Family Medicine

## 2023-11-09 ENCOUNTER — Encounter: Payer: Self-pay | Admitting: Family Medicine

## 2023-11-09 VITALS — BP 126/88 | HR 74 | Temp 98.5°F | Ht 70.0 in | Wt 238.2 lb

## 2023-11-09 DIAGNOSIS — H938X3 Other specified disorders of ear, bilateral: Secondary | ICD-10-CM | POA: Diagnosis not present

## 2023-11-09 DIAGNOSIS — H6123 Impacted cerumen, bilateral: Secondary | ICD-10-CM | POA: Diagnosis not present

## 2023-11-09 NOTE — Progress Notes (Addendum)
 Patient Office Visit  Assessment & Plan:  Sensation of plugged ear on both sides -     Ear Lavage  Bilateral impacted cerumen -     Ear Lavage   Assessment and Plan    Cerumen impaction Clogged ears, predominantly left ear. Decreased hearing, inconsistent hearing aid use due to sweat concerns. More cerumen in the left ear. - Perform ear irrigation for both ears, focusing on the left ear. TM visualized after lavage Patient will use OTC Debrox as needed.   Hearing loss Hearing loss with inconsistent hearing aid use due to sweat concerns. Difficulty hearing, increased TV volume, and requiring louder speech. - Recommend returning to the hearing aid provider for adjustment and guidance on proper use.        Return if symptoms worsen or fail to improve.   Subjective:    Patient ID: Brendan Hopkins, male    DOB: 02/23/1936  Age: 88 y.o. MRN: [id number]  Chief Complaint  Patient presents with   Cerumen Impaction    HPI Discussed the use of AI scribe software for clinical note transcription with the patient, who gave verbal consent to proceed.  History of Present Illness        Brendan Hopkins is an 88 year old male with COPD who presents with clogged ears and decreased hearing. Left ear maybe worse  He has experienced clogged ears for approximately one and a half to two weeks, with the left ear more affected than the right. He has noticed a decrease in hearing, although he is already hard of hearing and uses hearing aids. No ear pain or recent swimming activities are reported.  He has a history of COPD, primarily affecting his right lung, which developed after a COVID episode a few years ago. He quit smoking about fifteen years ago.  He has had hearing aids for almost a year but stopped using them for three to four months due to concerns about damaging them while working on house renovations. He has since struggled to readjust to using them, noting issues with the  controls. He turns up the TV volume at home and occasionally needs to speak louder to communicate. Physical Exam HEENT: Cerumen present in right ear. Cerumen impaction in left ear. Results Procedure: Ear irrigation Description: Ear irrigation performed on both ears. The left ear had more cerumen buildup compared to the right ear. Assessment & Plan Cerumen impaction Clogged ears, predominantly left ear. Decreased hearing, inconsistent hearing aid use due to sweat concerns. More cerumen in the left ear. - Perform ear irrigation for both ears, focusing on the left ear. TM visualized after lavage Patient will use OTC Debrox as needed.   Hearing loss Hearing loss with inconsistent hearing aid use due to sweat concerns. Difficulty hearing, increased TV volume, and requiring louder speech. - Recommend returning to the hearing aid provider for adjustment and guidance on proper use.     The ASCVD Risk score (Arnett DK, et al., 2019) failed to calculate for the following reasons:   The 2019 ASCVD risk score is only valid for ages 97 to 11   Risk score cannot be calculated because patient has a medical history suggesting prior/existing ASCVD  Past Medical History:  Diagnosis Date   A-fib [facility])    Acute deep vein thrombosis (DVT) of tibial vein of right lower extremity (HCC)    after ankle fracture/surgery (2/21)   Arthritis    RIGHT HIP, HANDS, BACK   Cancer (HCC)  Skin cancer - face, neck and back   Cataract    bilateral - removed   Cervical radiculopathy    severe C3-4 neuroforaminal stenosis, C5-6 left synovial cyst compressing left nerve root.   Chronic kidney disease    stage 3a   Colon polyps    2005   COPD (chronic obstructive pulmonary disease) (HCC)    SMOKED FOR 45 YRS   Cystoid macular degeneration of both eyes    Diverticulosis    2005   Fatty liver    GERD (gastroesophageal reflux disease)    Hard of hearing    has hearing aids   History of kidney stones     Hypertension    Leg fracture, right    Oxygen  dependent    2L via [place].  Patient wears oxygen  at night and when need during day   Pain    RIGHT HIP AND BACK   Personal history of colonic polyps-adenoma and sessile serrated polyp 07/15/2010   Pneumonia 03/2019   x several   RBBB    HX AFIB IN PAST- PAC'S   Shortness of breath    WITH EXERTION ONLY   Stroke (HCC)    TIA hx   Wears glasses    Past Surgical History:  Procedure Laterality Date   COLONOSCOPY     maybe 3    COLONOSCOPY W/ POLYPECTOMY     CYSTOSCOPY WITH RETROGRADE PYELOGRAM, URETEROSCOPY AND STENT PLACEMENT Left 12/11/2019   Procedure: CYSTOSCOPY WITH LEFT RETROGRADE LEFT  URETEROSCOPY WITH HOLMIUM LASER AND STENT PLACEMENT;  Surgeon: Homero Luster, MD;  Location: [facility];  Service: Urology;  Laterality: Left;   EYE SURGERY Bilateral    x 5   I & D EXTREMITY Left 07/20/2023   Procedure: LEFT ELBOW DEBRIDEMENT AND CLOSURE;  Surgeon: Timothy Ford, MD;  Location: [facility] OR;  Service: Orthopedics;  Laterality: Left;   OLECRANON BURSECTOMY Left 07/01/2023   Procedure: EXCISION LEFT ELBOW OLECRANON (ELBOW) BURSA;  Surgeon: Timothy Ford, MD;  [facility];  Service: Orthopedics;  Laterality: Left;   ORIF ANKLE FRACTURE Right 07/11/2019   Procedure: OPEN REDUCTION INTERNAL FIXATION (ORIF) ANKLE FRACTURE;  Surgeon: Laneta Pintos, MD;  Location: [facility] OR;  Service: Orthopedics;  Laterality: Right;   skin cancers removed      TOTAL HIP ARTHROPLASTY Right 01/09/2013   Procedure: RIGHT TOTAL HIP ARTHROPLASTY ANTERIOR APPROACH;  Surgeon: Bevin Bucks, MD;  Location: [facility];  Service: Orthopedics;  Laterality: Right;   TOTAL HIP ARTHROPLASTY Left 02/24/2021   Procedure: TOTAL HIP ARTHROPLASTY ANTERIOR APPROACH;  Surgeon: Claiborne Crew, MD;  Location: [facility];  Service: Orthopedics;  Laterality: Left;   Social History   Tobacco Use   Smoking status: Former    Current packs/day: 0.00    Average packs/day: 2.0 packs/day for 57.0 years  (114.0 ttl pk-yrs)    Types: Cigarettes    Start date: 05/24/1950    Quit date: 05/25/2007    Years since quitting: 16.4   Smokeless tobacco: Never   Tobacco comments:    Does not qualify due to age.   Vaping Use   Vaping status: Never Used  Substance Use Topics   Alcohol use: No    Alcohol/week: 0.0 standard drinks of alcohol   Drug use: No   Family History  Problem Relation Age of Onset   Cancer Mother        died more of old age at age 58   Cancer Father  Colon cancer Neg Hx    Esophageal cancer Neg Hx    Prostate cancer Neg Hx    Rectal cancer Neg Hx    Stomach cancer Neg Hx    No Known Allergies  ROS    Objective:    BP 126/88   Pulse 74   Temp 98.5 F (36.9 C)   Ht 5' 10 (1.778 m)   Wt 238 lb 4 oz (108.1 kg)   SpO2 95%   BMI 34.19 kg/m  BP Readings from Last 3 Encounters:  11/09/23 126/88  10/04/23 (!) 140/69  08/24/23 127/80   Wt Readings from Last 3 Encounters:  11/09/23 238 lb 4 oz (108.1 kg)  10/04/23 242 lb (109.8 kg)  08/24/23 248 lb 9.6 oz (112.8 kg)    Physical Exam Vitals and nursing note reviewed.  Constitutional:      Appearance: Normal appearance.  HENT:     Head: Normocephalic.     Right Ear: There is impacted cerumen.     Left Ear: There is impacted cerumen.     Ears:     Comments: After lavage- Right TM visualized after lavagae. Manually removed cerumen after lavage left ear. Patient tolerated the procedure without difficulty.   Cardiovascular:     Rate and Rhythm: Normal rate and regular rhythm.     Heart sounds: Normal heart sounds.  Pulmonary:     Effort: Pulmonary effort is normal. No respiratory distress.     Breath sounds: No wheezing.     Comments: Diminished breath sounds bilaterally.   Neurological:     General: No focal deficit present.     Mental Status: He is alert.      Procedure- Ceruminosis is noted.  Wax is removed by syringing and manual debridement. Instructions for home care to prevent wax buildup  are given.  No results found for any visits on 11/09/23.

## 2023-11-10 DIAGNOSIS — N1831 Chronic kidney disease, stage 3a: Secondary | ICD-10-CM | POA: Diagnosis not present

## 2023-11-10 DIAGNOSIS — J449 Chronic obstructive pulmonary disease, unspecified: Secondary | ICD-10-CM | POA: Diagnosis not present

## 2023-11-28 ENCOUNTER — Other Ambulatory Visit: Payer: Self-pay | Admitting: Family Medicine

## 2023-11-30 DIAGNOSIS — J449 Chronic obstructive pulmonary disease, unspecified: Secondary | ICD-10-CM | POA: Diagnosis not present

## 2023-11-30 DIAGNOSIS — N1831 Chronic kidney disease, stage 3a: Secondary | ICD-10-CM | POA: Diagnosis not present

## 2023-12-02 DIAGNOSIS — N1831 Chronic kidney disease, stage 3a: Secondary | ICD-10-CM | POA: Diagnosis not present

## 2023-12-02 DIAGNOSIS — J449 Chronic obstructive pulmonary disease, unspecified: Secondary | ICD-10-CM | POA: Diagnosis not present

## 2023-12-10 DIAGNOSIS — N1831 Chronic kidney disease, stage 3a: Secondary | ICD-10-CM | POA: Diagnosis not present

## 2023-12-10 DIAGNOSIS — J449 Chronic obstructive pulmonary disease, unspecified: Secondary | ICD-10-CM | POA: Diagnosis not present

## 2023-12-14 ENCOUNTER — Other Ambulatory Visit (HOSPITAL_BASED_OUTPATIENT_CLINIC_OR_DEPARTMENT_OTHER): Payer: Self-pay | Admitting: Pulmonary Disease

## 2023-12-15 ENCOUNTER — Telehealth: Payer: Self-pay

## 2023-12-15 NOTE — Telephone Encounter (Signed)
 Copied from CRM [id number]. Topic: Clinical - Medication Question >> Dec 15, 2023  3:56 PM Delon T wrote: Reason for CRM: equilibrium has been giving him issues so will be taking two of the meclizine  (ANTIVERT ) 25 MG tablet- will be needing a refill sooner, will be taking 2 every day- [phone]

## 2023-12-16 ENCOUNTER — Other Ambulatory Visit: Payer: Self-pay | Admitting: Family Medicine

## 2023-12-16 MED ORDER — MECLIZINE HCL 25 MG PO TABS: 25.0000 mg | ORAL_TABLET | Freq: Three times a day (TID) | ORAL | 0 refills | Status: DC | PRN

## 2023-12-22 ENCOUNTER — Ambulatory Visit

## 2023-12-22 VITALS — BP 126/88 | Ht 70.0 in | Wt 230.0 lb

## 2023-12-22 DIAGNOSIS — Z0000 Encounter for general adult medical examination without abnormal findings: Secondary | ICD-10-CM | POA: Diagnosis not present

## 2023-12-22 DIAGNOSIS — Z2821 Immunization not carried out because of patient refusal: Secondary | ICD-10-CM

## 2023-12-22 NOTE — Patient Instructions (Signed)
 Mr. Temme , Thank you for taking time out of your busy schedule to complete your Annual Wellness Visit with me. I enjoyed our conversation and look forward to speaking with you again next year. I, as well as your care team,  appreciate your ongoing commitment to your health goals. Please review the following plan we discussed and let me know if I can assist you in the future. Your Game plan/ To Do List    Referrals: If you haven't heard from the office you've been referred to, please reach out to them at the phone provided.  none Follow up Visits: Next Medicare AWV with our clinical staff: 12/27/2024   Have you seen your provider in the last 6 months (3 months if uncontrolled diabetes)? No Next Office Visit with your provider: n/a  Clinician Recommendations:  Aim for 30 minutes of exercise or brisk walking, 6-8 glasses of water , and 5 servings of fruits and vegetables each day.       This is a list of the screening recommended for you and due dates:  Health Maintenance  Topic Date Due   Zoster (Shingles) Vaccine (2 of 2) 01/02/2021   COVID-19 Vaccine (6 - 2024-25 season) 01/23/2023   Flu Shot  12/23/2023   Medicare Annual Wellness Visit  12/21/2024   Colon Cancer Screening  11/17/2027   Pneumococcal Vaccine for age over 79  Completed   Hepatitis B Vaccine  Aged Out   HPV Vaccine  Aged Out   Meningitis B Vaccine  Aged Out   DTaP/Tdap/Td vaccine  Discontinued    Advanced directives: (Copy Requested) Please bring a copy of your health care power of attorney and living will to the office to be added to your chart at your convenience. You can mail to [facility] [address] [place], [place] [address] or email to [email or web address] Advance Care Planning is important because it:  [x]  Makes sure you receive the medical care that is consistent with your values, goals, and preferences  [x]  It provides guidance to your family and loved ones and reduces their  decisional burden about whether or not they are making the right decisions based on your wishes.  Follow the link provided in your after visit summary or read over the paperwork we have mailed to you to help you started getting your Advance Directives in place. If you need assistance in completing these, please reach out to us  so that we can help you!  See attachments for Preventive Care and Fall Prevention Tips.

## 2023-12-22 NOTE — Progress Notes (Signed)
 Because this visit was a virtual/telehealth visit,  certain criteria was not obtained, such a blood pressure, CBG if applicable, and timed get up and go. Any medications not marked as taking were not mentioned during the medication reconciliation part of the visit. Any vitals not documented were not able to be obtained due to this being a telehealth visit or patient was unable to self-report a recent blood pressure reading due to a lack of equipment at home via telehealth. Vitals that have been documented are verbally provided by the patient.  This visit was performed by a medical professional under my direct supervision. I was immediately available for consultation/collaboration. I have reviewed and agree with the Annual Wellness Visit documentation.  Subjective:   Brendan Hopkins is a 88 y.o. who presents for a Medicare Wellness preventive visit.  As a reminder, Annual Wellness Visits don't include a physical exam, and some assessments may be limited, especially if this visit is performed virtually. We may recommend an in-person follow-up visit with your provider if needed.  Visit Complete: Virtual I connected with  Brendan Hopkins on 12/22/23 by a audio enabled telemedicine application and verified that I am speaking with the correct person using two identifiers.  Patient Location: Home  Provider Location: Home Office  I discussed the limitations of evaluation and management by telemedicine. The patient expressed understanding and agreed to proceed.  Vital Signs: Because this visit was a virtual/telehealth visit, some criteria may be missing or patient reported. Any vitals not documented were not able to be obtained and vitals that have been documented are patient reported.  VideoDeclined- This patient declined Librarian, academic. Therefore the visit was completed with audio only.  Persons Participating in Visit: Patient.  AWV Questionnaire: No: Patient  Medicare AWV questionnaire was not completed prior to this visit.  Cardiac Risk Factors include: advanced age (>62men, >13 women);male gender;obesity (BMI >30kg/m2);hypertension     Objective:    Today's Vitals   12/22/23 0932  BP: 126/88  Weight: 230 lb (104.3 kg)  Height: 5' 10 (1.778 m)   Body mass index is 33 kg/m.     12/22/2023    9:43 AM 07/01/2023    7:04 AM 12/10/2022    9:28 AM 08/28/2022    3:00 PM 07/03/2022    5:33 AM 03/26/2022    9:12 AM 03/26/2021   12:09 PM  Advanced Directives  Does Patient Have a Medical Advance Directive? Yes No No No No No No  Type of Estate agent of [place];Living will        Does patient want to make changes to medical advance directive? No - Patient declined        Copy of Healthcare Power of Attorney in Chart? No - copy requested        Would patient like information on creating a medical advance directive?  No - Patient declined No - Patient declined No - Patient declined   No - Patient declined    Current Medications (verified) Outpatient Encounter Medications as of 12/22/2023  Medication Sig   acetaminophen  (TYLENOL ) 650 MG CR tablet Take 650 mg by mouth 3 (three) times daily as needed for pain.   albuterol  (VENTOLIN  HFA) 108 (90 Base) MCG/ACT inhaler Inhale 1 to 2 puffs by mouth every 6 hours as needed for shortness of breath   amLODipine  (NORVASC ) 5 MG tablet Take 1 tablet by mouth at bedtime   furosemide  (LASIX ) 40 MG tablet Take 1 tablet  by mouth once daily   gabapentin  (NEURONTIN ) 300 MG capsule Take 1 capsule (300 mg total) by mouth 3 (three) times daily. TAKE 1 CAPSULE BY MOUTH THREE TIMES DAILY AS NEEDED FOR  SCIATICA   Iron , Ferrous Sulfate , 325 (65 Fe) MG TABS Take 325 mg by mouth daily.   meclizine  (ANTIVERT ) 25 MG tablet Take 1 tablet (25 mg total) by mouth 3 (three) times daily as needed for dizziness.   metoprolol  succinate (TOPROL -XL) 25 MG 24 hr tablet Take 1 tablet (25 mg total) by mouth daily.  OFFICE VISIT NEEDED FOR ADDITIONAL REFILLS   oxyCODONE -acetaminophen  (PERCOCET/ROXICET) 5-325 MG tablet Take 1 tablet by mouth every 4 (four) hours as needed.   OXYGEN  Inhale 2 L into the lungs daily as needed (Shortness of breath).   pantoprazole  (PROTONIX ) 40 MG tablet Take 1 tablet by mouth every day   Specialty Vitamins Products (MEMORY COMPLEX BRAIN HEALTH PO) Take 1 tablet by mouth daily.   TRELEGY ELLIPTA  200-62.5-25 MCG/ACT AEPB INHALE 1 PUFF INTO THE LUNGS ONCE DAILY   zolpidem  (AMBIEN ) 10 MG tablet Take 1 tablet by mouth at bedtime as needed for sleep   ciprofloxacin  (CIPRO ) 500 MG tablet Take 1 tablet (500 mg total) by mouth 2 (two) times daily. (Patient not taking: Reported on 12/22/2023)   colchicine  0.6 MG tablet Take 1 tablet (0.6 mg total) by mouth daily. (Patient not taking: Reported on 12/22/2023)   Multiple Vitamin (MULTIVITAMIN WITH MINERALS) TABS Take 1 tablet by mouth daily. (Patient not taking: Reported on 12/22/2023)   Omega-3 Fatty Acids (FISH OIL) 1200 MG CAPS Take 1,200 mg by mouth every evening. (Patient not taking: Reported on 12/22/2023)   rivaroxaban  (XARELTO ) 20 MG TABS tablet Take 1 tablet (20 mg total) by mouth daily. (Patient not taking: Reported on 12/22/2023)   No facility-administered encounter medications on file as of 12/22/2023.    Allergies (verified) Patient has no known allergies.   History: Past Medical History:  Diagnosis Date   A-fib (HCC)    Acute deep vein thrombosis (DVT) of tibial vein of right lower extremity (HCC)    after ankle fracture/surgery (2/21)   Arthritis    RIGHT HIP, HANDS, BACK   Cancer (HCC)    Skin cancer - face, neck and back   Cataract    bilateral - removed   Cervical radiculopathy    severe C3-4 neuroforaminal stenosis, C5-6 left synovial cyst compressing left nerve root.   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  Patient wears oxygen  at night and when need during day   Pain    RIGHT HIP AND BACK   Personal history of colonic polyps-adenoma and sessile serrated polyp 07/15/2010   Pneumonia 03/2019   x several   RBBB    HX AFIB IN PAST- PAC'S   Shortness of breath    WITH EXERTION ONLY   Stroke (HCC)    TIA hx   Wears glasses    Past Surgical History:  Procedure Laterality Date   COLONOSCOPY     maybe 3    COLONOSCOPY W/ POLYPECTOMY     CYSTOSCOPY WITH RETROGRADE PYELOGRAM, URETEROSCOPY AND STENT PLACEMENT Left 12/11/2019   Procedure: CYSTOSCOPY WITH LEFT RETROGRADE LEFT  URETEROSCOPY WITH HOLMIUM LASER AND STENT PLACEMENT;  Surgeon: Watt Rush, MD;  Location: [facility];  Service: Urology;  Laterality: Left;   EYE SURGERY Bilateral    x 5   I & D EXTREMITY Left 07/20/2023   Procedure: LEFT ELBOW DEBRIDEMENT AND CLOSURE;  Surgeon: Harden Jerona GAILS, MD;  Location: [facility] OR;  Service: Orthopedics;  Laterality: Left;   OLECRANON BURSECTOMY Left 07/01/2023   Procedure: EXCISION LEFT ELBOW OLECRANON (ELBOW) BURSA;  Surgeon: Harden Jerona GAILS, MD;  Location: [facility] OR;  Service: Orthopedics;  Laterality: Left;   ORIF ANKLE FRACTURE Right 07/11/2019   Procedure: OPEN REDUCTION INTERNAL FIXATION (ORIF) ANKLE FRACTURE;  Surgeon: Kendal Franky SQUIBB, MD;  Location: [facility] OR;  Service: Orthopedics;  Laterality: Right;   skin cancers removed      TOTAL HIP ARTHROPLASTY Right 01/09/2013   Procedure: RIGHT TOTAL HIP ARTHROPLASTY ANTERIOR APPROACH;  Surgeon: Donnice JONETTA Car, MD;  Location: [facility];  Service: Orthopedics;  Laterality: Right;   TOTAL HIP ARTHROPLASTY Left 02/24/2021   Procedure: TOTAL HIP ARTHROPLASTY ANTERIOR APPROACH;  Surgeon: Car Donnice, MD;  Location:  [facility];  Service: Orthopedics;  Laterality: Left;   Family History  Problem Relation Age of Onset   Cancer Mother        died more of old age at age 77   Cancer Father    Colon cancer Neg Hx    Esophageal cancer Neg Hx    Prostate cancer Neg Hx    Rectal cancer Neg Hx    Stomach cancer Neg Hx    Social History   Socioeconomic History   Marital status: Widowed    Spouse name: Not on file   Number of children: 3   Years of education: 9   Highest education level: Not on file  Occupational History   Occupation: Retired  Tobacco Use   Smoking status: Former    Current packs/day: 0.00    Average packs/day: 2.0 packs/day for 57.0 years (114.0 ttl pk-yrs)    Types: Cigarettes    Start date: 05/24/1950    Quit date: 05/25/2007    Years since quitting: 16.5   Smokeless tobacco: Never   Tobacco comments:    Does not qualify due to age.   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   Social Drivers of Corporate investment banker Strain: Low Risk  (12/22/2023)   Overall Financial Resource Strain (CARDIA)    Difficulty of Paying Living Expenses: Not very hard  Food Insecurity: No Food Insecurity (12/22/2023)   Hunger Vital Sign    Worried About Running Out of Food in the Last Year: Never true    Ran Out of Food in the Last Year: Never true  Transportation Needs: No Transportation Needs (12/22/2023)   PRAPARE - Administrator, Civil Service (Medical): No    Lack of Transportation (Non-Medical): No  Physical Activity: Sufficiently Active (12/22/2023)  Exercise Vital Sign    Days of Exercise per Week: 5 days    Minutes of Exercise per Session: 40 min  Stress: No Stress Concern Present (12/22/2023)    [employer] of Occupational Health - Occupational Stress Questionnaire    Feeling of Stress: Not at all  Social Connections: Patient Declined (12/22/2023)   Social Connection and Isolation Panel    Frequency of Communication with Friends and Family: Patient declined    Frequency of Social Gatherings with Friends and Family: Patient declined    Attends Religious Services: Patient declined    Database administrator or Organizations: Patient declined    Attends Banker Meetings: Patient declined    Marital Status: Patient declined    Tobacco Counseling Counseling given: Not Answered Tobacco comments: Does not qualify due to age.     Clinical Intake:  Pre-visit preparation completed: Yes  Pain : No/denies pain     BMI - recorded: 33 Nutritional Status: BMI > 30  Obese Nutritional Risks: None Diabetes: No  Lab Results  Component Value Date   HGBA1C 6.0 (H) 01/26/2019   HGBA1C 6.1 (H) 12/20/2017   HGBA1C 5.7 (H) 12/03/2011     How often do you need to have someone help you when you read instructions, pamphlets, or other written materials from your doctor or pharmacy?: 1 - Never  Interpreter Needed?: No  Information entered by :: Lyle Anderson LATHER   Activities of Daily Living     12/22/2023    9:38 AM 07/20/2023   10:38 AM  In your present state of health, do you have any difficulty performing the following activities:  Hearing? 0   Vision? 0   Difficulty concentrating or making decisions? 0   Walking or climbing stairs? 0   Dressing or bathing? 0   Doing errands, shopping? 0 0  Preparing Food and eating ? N   Using the Toilet? N   In the past six months, have you accidently leaked urine? N   Do you have problems with loss of bowel control? N   Managing your Medications? N   Managing your Finances? N   Housekeeping or managing your Housekeeping? N     Patient Care Team: Duanne Butler DASEN, MD as PCP - General (Family Medicine) Pietro  Redell RAMAN, MD as PCP - Cardiology (Cardiology) Nicholaus Sherlean CROME, [facility] (Inactive) as Pharmacist (Pharmacist)  I have updated your Care Teams any recent Medical Services you may have received from other providers in the past year.     Assessment:   This is a routine wellness examination for Brendan Hopkins.  Hearing/Vision screen Hearing Screening - Comments:: Wears hearing aids  Vision Screening - Comments:: Patient wears glasses    Goals Addressed             This Visit's Progress    Patient Stated       To get better and take care of his self       Depression Screen     12/22/2023    9:44 AM 11/09/2023    9:07 AM 03/21/2023   12:33 PM 03/08/2023    3:48 PM 12/10/2022   10:02 AM 09/07/2022    2:21 PM 06/22/2022   12:25 PM  PHQ 2/9 Scores  PHQ - 2 Score 0 0 0 0 0 0 0  PHQ- 9 Score 0  1 1 2  9     Fall Risk     12/22/2023    9:43 AM 11/09/2023  9:06 AM 03/21/2023   12:33 PM 03/10/2023   11:12 AM 03/08/2023   11:21 AM  Fall Risk   Falls in the past year? 0 0 0 0 0  Number falls in past yr: 0 0 0 0 0  Injury with Fall? 0 0 0  0  Risk for fall due to : No Fall Risks   Impaired balance/gait;Impaired mobility Impaired balance/gait;Impaired mobility  Follow up Falls evaluation completed   Falls evaluation completed;Falls prevention discussed Falls evaluation completed;Falls prevention discussed    MEDICARE RISK AT HOME:  Medicare Risk at Home Any stairs in or around the home?: Yes If so, are there any without handrails?: No Home free of loose throw rugs in walkways, pet beds, electrical cords, etc?: Yes Adequate lighting in your home to reduce risk of falls?: Yes Life alert?: Yes Use of a cane, walker or w/c?: No Grab bars in the bathroom?: Yes Shower chair or bench in shower?: Yes Elevated toilet seat or a handicapped toilet?: Yes  TIMED UP AND GO:  Was the test performed?  No  Cognitive Function: 6CIT completed        12/22/2023    9:35 AM 03/26/2022    9:17 AM  03/26/2021   12:13 PM  6CIT Screen  What Year? 0 points 0 points 0 points  What month? 0 points 0 points 0 points  What time? 0 points 3 points 0 points  Count back from 20 0 points 0 points 0 points  Months in reverse 0 points 0 points 0 points  Repeat phrase 0 points 0 points 0 points  Total Score 0 points 3 points 0 points    Immunizations Immunization History  Administered Date(s) Administered   Fluad Quad(high Dose 65+) 01/25/2019, 02/21/2020, 02/10/2021   Influenza Split 02/15/2011   Influenza, High Dose Seasonal PF 02/17/2018   Influenza,inj,Quad PF,6+ Mos 04/09/2013, 02/12/2014, 03/14/2015, 03/04/2016, 03/02/2017   Influenza-Unspecified 01/27/2023   PFIZER(Purple Top)SARS-COV-2 Vaccination 08/16/2019, 09/06/2019, 03/17/2020, 11/07/2020   PNEUMOCOCCAL CONJUGATE-20 10/17/2022   Pfizer Covid-19 Vaccine Bivalent Booster 67yrs & up 03/24/2021   Pneumococcal Conjugate-13 04/09/2013   Pneumococcal Polysaccharide-23 12/19/2014   Td 09/10/2003   Zoster Recombinant(Shingrix) 11/07/2020    Screening Tests Health Maintenance  Topic Date Due   Zoster Vaccines- Shingrix (2 of 2) 01/02/2021   COVID-19 Vaccine (6 - 2024-25 season) 01/23/2023   INFLUENZA VACCINE  12/23/2023   Medicare Annual Wellness (AWV)  12/21/2024   Colonoscopy  11/17/2027   Pneumococcal Vaccine: 50+ Years  Completed   Hepatitis B Vaccines  Aged Out   HPV VACCINES  Aged Out   Meningococcal B Vaccine  Aged Out   DTaP/Tdap/Td  Discontinued    Health Maintenance  Health Maintenance Due  Topic Date Due   Zoster Vaccines- Shingrix (2 of 2) 01/02/2021   COVID-19 Vaccine (6 - 2024-25 season) 01/23/2023   Health Maintenance Items Addressed:   Additional Screening:  Vision Screening: Recommended annual ophthalmology exams for early detection of glaucoma and other disorders of the eye. Would you like a referral to an eye doctor? No    Dental Screening: Recommended annual dental exams for proper oral  hygiene  Community Resource Referral / Chronic Care Management: CRR required this visit?  No   CCM required this visit?  No   Plan:    I have personally reviewed and noted the following in the patient's chart:   Medical and social history Use of alcohol, tobacco or illicit drugs  Current medications and supplements including  opioid prescriptions. Patient is not currently taking opioid prescriptions. Functional ability and status Nutritional status Physical activity Advanced directives List of other physicians Hospitalizations, surgeries, and ER visits in previous 12 months Vitals Screenings to include cognitive, depression, and falls Referrals and appointments  In addition, I have reviewed and discussed with patient certain preventive protocols, quality metrics, and best practice recommendations. A written personalized care plan for preventive services as well as general preventive health recommendations were provided to patient.   Lyle MARLA Right, [place]   12/22/2023   After Visit Summary: (MyChart) Due to this being a telephonic visit, the after visit summary with patients personalized plan was offered to patient via MyChart   Notes: Nothing significant to report at this time.

## 2023-12-31 DIAGNOSIS — J449 Chronic obstructive pulmonary disease, unspecified: Secondary | ICD-10-CM | POA: Diagnosis not present

## 2023-12-31 DIAGNOSIS — N1831 Chronic kidney disease, stage 3a: Secondary | ICD-10-CM | POA: Diagnosis not present

## 2024-01-02 DIAGNOSIS — J449 Chronic obstructive pulmonary disease, unspecified: Secondary | ICD-10-CM | POA: Diagnosis not present

## 2024-01-04 ENCOUNTER — Other Ambulatory Visit: Payer: Self-pay | Admitting: Family Medicine

## 2024-01-10 DIAGNOSIS — J449 Chronic obstructive pulmonary disease, unspecified: Secondary | ICD-10-CM | POA: Diagnosis not present

## 2024-01-10 DIAGNOSIS — N1831 Chronic kidney disease, stage 3a: Secondary | ICD-10-CM | POA: Diagnosis not present

## 2024-01-11 ENCOUNTER — Other Ambulatory Visit: Payer: Self-pay | Admitting: Family Medicine

## 2024-01-12 NOTE — Telephone Encounter (Signed)
 Requested Prescriptions  Pending Prescriptions Disp Refills   metoprolol  succinate (TOPROL -XL) 25 MG 24 hr tablet [Pharmacy Med Name: Metoprolol  Succinate ER 25 MG Oral Tablet Extended Release 24 Hour] 90 tablet 0    Sig: TAKE 1 TABLET BY MOUTH ONCE DAILY . APPOINTMENT REQUIRED FOR FUTURE REFILLS     Cardiovascular:  Beta Blockers Passed - 01/12/2024  1:55 PM      Passed - Last BP in normal range    BP Readings from Last 1 Encounters:  12/22/23 126/88         Passed - Last Heart Rate in normal range    Pulse Readings from Last 1 Encounters:  11/09/23 74         Passed - Valid encounter within last 6 months    Recent Outpatient Visits           2 months ago Sensation of plugged ear on both sides   [facility] Aletha Bene, MD   9 months ago Chronic obstructive pulmonary disease, unspecified COPD type [facility])   [place] [facility] Duanne Butler DASEN, MD   1 year ago Bilateral impacted cerumen   [facility] Duanne Butler DASEN, MD   1 year ago Recurrent pneumonia   [place] [facility] Medicine Duanne Butler DASEN, MD   1 year ago Burning mouth syndrome   [place] [facility] Kayla Jeoffrey RAMAN, [place]

## 2024-01-16 ENCOUNTER — Other Ambulatory Visit: Payer: Self-pay | Admitting: Family Medicine

## 2024-01-17 NOTE — Telephone Encounter (Signed)
 Duplicate request, [facility] 01/12/24.  Requested Prescriptions  Pending Prescriptions Disp Refills   metoprolol  succinate (TOPROL -XL) 25 MG 24 hr tablet [Pharmacy Med Name: Metoprolol  Succinate ER 25 MG Oral Tablet Extended Release 24 Hour] 90 tablet 0    Sig: TAKE 1 TABLET BY MOUTH ONCE DAILY . APPOINTMENT REQUIRED FOR FUTURE REFILLS     Cardiovascular:  Beta Blockers Passed - 01/17/2024  1:38 PM      Passed - Last BP in normal range    BP Readings from Last 1 Encounters:  12/22/23 126/88         Passed - Last Heart Rate in normal range    Pulse Readings from Last 1 Encounters:  11/09/23 74         Passed - Valid encounter within last 6 months    Recent Outpatient Visits           2 months ago Sensation of plugged ear on both sides   [place] [facility] Medicine Aletha Bene, MD   10 months ago Chronic obstructive pulmonary disease, unspecified COPD type [facility])   [place] [facility] Duanne Butler DASEN, MD   1 year ago Bilateral impacted cerumen   [place] [facility] Medicine Duanne Butler DASEN, MD   1 year ago Recurrent pneumonia   [place] [facility] Medicine Duanne Butler DASEN, MD   1 year ago Burning mouth syndrome   [place] [facility] Medicine Kayla Jeoffrey RAMAN, [place]

## 2024-01-19 ENCOUNTER — Ambulatory Visit: Payer: Self-pay

## 2024-01-19 NOTE — Telephone Encounter (Signed)
 FYI Only or Action Required?: FYI only for provider.  Patient was last seen in primary care on 11/09/2023 by Aletha Bene, MD.  [facility] Nurse Triage reporting Triage.  Symptoms began several days ago.  Interventions attempted: Rest, hydration, or home remedies.  Symptoms are: gradually worsening.  Triage Disposition: See PCP When Office is Open (Within 3 Days)  Patient/caregiver understands and will follow disposition?: Yes Reason for Disposition  [1] MODERATE back pain (e.g., interferes with normal activities) AND [2] present > 3 days  Answer Assessment - Initial Assessment Questions 1. ONSET: When did the pain begin? (e.g., minutes, hours, days)     Chronic, worse x 3 days  2. LOCATION: Where does it hurt? (upper, mid or lower back)     Mid-left  3. SEVERITY: How bad is the pain?  (e.g., Scale 1-10; mild, moderate, or severe)     Moderate to severe  4. RADIATION: Does the pain shoot into your legs or somewhere else?     Denies  5. CAUSE:  What do you think is causing the back pain?      Unknown  Protocols used: Back Pain-A-AH Copied from CRM [id number]. Topic: Clinical - Red Word Triage >> Jan 19, 2024 11:02 AM Tiffini S wrote: [facility] that prompted transfer to Nurse Triage: Patient is having back pains on the left side- have been very bad for the last three days.

## 2024-01-20 ENCOUNTER — Other Ambulatory Visit (HOSPITAL_BASED_OUTPATIENT_CLINIC_OR_DEPARTMENT_OTHER): Payer: Self-pay | Admitting: Pulmonary Disease

## 2024-01-20 ENCOUNTER — Ambulatory Visit: Admitting: Family Medicine

## 2024-01-20 ENCOUNTER — Encounter: Payer: Self-pay | Admitting: Family Medicine

## 2024-01-20 VITALS — BP 122/72 | HR 86 | Temp 98.5°F | Ht 70.0 in | Wt 234.3 lb

## 2024-01-20 DIAGNOSIS — Z96642 Presence of left artificial hip joint: Secondary | ICD-10-CM | POA: Diagnosis not present

## 2024-01-20 DIAGNOSIS — Z7901 Long term (current) use of anticoagulants: Secondary | ICD-10-CM

## 2024-01-20 DIAGNOSIS — M545 Low back pain, unspecified: Secondary | ICD-10-CM | POA: Diagnosis not present

## 2024-01-20 DIAGNOSIS — M1990 Unspecified osteoarthritis, unspecified site: Secondary | ICD-10-CM

## 2024-01-20 MED ORDER — PREDNISONE 10 MG (21) PO TBPK: ORAL_TABLET | ORAL | 0 refills | Status: DC

## 2024-01-20 MED ORDER — TIZANIDINE HCL 4 MG PO TABS: 4.0000 mg | ORAL_TABLET | Freq: Four times a day (QID) | ORAL | 0 refills | Status: DC | PRN

## 2024-01-20 MED ORDER — LIDOCAINE 5 % EX PTCH: 1.0000 | MEDICATED_PATCH | CUTANEOUS | 0 refills | Status: DC

## 2024-01-20 NOTE — Progress Notes (Signed)
 Patient Office Visit  Assessment & Plan:  Acute bilateral low back pain without sciatica -     Lidocaine ; Place 1 patch onto the skin daily. Remove & Discard patch within 12 hours or as directed by MD  Dispense: 30 patch; Refill: 0 -     tiZANidine  HCl; Take 1 tablet (4 mg total) by mouth every 6 (six) hours as needed for muscle spasms.  Dispense: 30 tablet; Refill: 0 -     predniSONE ; Use as directed.  Dispense: 21 each; Refill: 0  Hx of long term use of blood thinners  Arthritis -     predniSONE ; Use as directed.  Dispense: 21 each; Refill: 0  S/P total left hip arthroplasty   Assessment and Plan    Chronic low back pain with lumbar spondylosis and lumbar spinal stenosis Pain most severe at L5-S1, exacerbated by standing, relieved by sitting. Previous x-rays show advanced arthritis and multilevel stenosis. Gabapentin  and Tylenol  provide some relief, but pain persists. - Prescribed prednisone  taper for 6 days, starting with 6 tablets and tapering down to 1 tablet per day. Take with food. Short-term use acceptable despite potential interaction with Xarelto . - Prescribed Lidoderm  patches for localized pain relief. - Prescribed Zanaflex  as a muscle relaxant, optional for him to pick up. - Discussed potential referral to a spine specialist if pain persists. - Advised against increasing gabapentin  dosage due to risk of dizziness and falls.  Atrial fibrillation Managed with Xarelto . Recently resumed Xarelto  after discontinuation due to bleeding concerns post-surgery.  Chronic obstructive pulmonary disease (COPD) Exacerbated post-COVID-19 infection in 2020. Previous use of prednisone  for exacerbations without adverse effects.          No follow-ups on file.   Subjective:    Patient ID: Brendan Hopkins, male    DOB: July 25, 1935  Age: 88 y.o. MRN: [id number]  Chief Complaint  Patient presents with   Back Pain    X 1.5 weeks. Pain is radiating across lower back.     Back  Pain   Discussed the use of AI scribe software for clinical note transcription with the patient, who gave verbal consent to proceed.  History of Present Illness        Brendan Hopkins is an 88 year old male with advanced lumbar arthritis who presents with severe lower back pain, left sided mostly  He experiences severe lower back pain, rated as ten out of ten at its worst, though not currently at that level. The pain is localized to the lower back and slightly radiates to the left hip without extending down the legs. The pain worsens with prolonged standing and improves when sitting or lying down. He has been experiencing this pain for several days, with the past few days being particularly severe, making it difficult to walk. patient has not done any unusual activities. Patient was able to drive to our office.   He has been taking gabapentin  300mg  up to three times per day for a couple of years for his lower back pain, which he finds helpful. He also takes Tylenol  every morning, which eases the pain temporarily but does not provide lasting relief. He mentions that taking gabapentin  and two Tylenols at a time helps, but he is cautious about the dosage due to his age.  His medical history includes arthritis, and he recalls being told he has had x-rays in the past for his back and hips. He has had both hips replaced and recalls having a hip x-ray  in 2022. He reports that a lumbar spine x-ray was performed in 2023 due to pain on the left side, which is where he experiences the most pain.  He lives alone in a one-story house and manages his daily activities independently, although he acknowledges limitations due to his age and COPD. He drives himself and is able to perform tasks like mowing the lawn using a riding mower. He has not experienced any falls recently. He does not feel like he needs a walker or a cane.   He takes Xarelto  for atrial fibrillation and a history of blood clots, having resumed  it a week ago after a temporary cessation due to a surgical procedure on his elbow. He also has a history of COPD, which developed after a COVID-19 infection in 2020.  No urinary issues such as burning or difficulty urinating. He reports sleeping well most nights, although occasionally waking up in the early morning hours. No recent falls and he has not tried walking on his tiptoes without assistance. Physical Exam HEENT: Ears normal no cerumen MUSCULOSKELETAL: Pain on forward flexion of the spine. Pain on hip flexion and external rotation. Results RADIOLOGY Lumbar spine X-ray: Advanced arthritis, multilevel stenosis, hypertrophy, most severe at L5-S1 (2023) Hip X-ray: Normal post-hip replacement (2022) Assessment & Plan Chronic low back pain with lumbar spondylosis and lumbar spinal stenosis Pain most severe at L5-S1, exacerbated by standing, relieved by sitting. Previous x-rays show advanced arthritis and multilevel stenosis. Gabapentin  and Tylenol  provide some relief, but pain persists. - Prescribed prednisone  taper for 6 days, starting with 6 tablets and tapering down to 1 tablet per day. Take with food. Short-term use acceptable despite potential interaction with Xarelto . - Prescribed Lidoderm  patches for localized pain relief. - Prescribed Zanaflex  as a muscle relaxant, optional for him to pick up. - Discussed potential referral to a spine specialist if pain persists. Patient declines at this time. Patient also does not want to do physical therapy at this time.  - Advised against increasing gabapentin  dosage due to risk of dizziness and falls.  Atrial fibrillation Managed with Xarelto . Recently resumed Xarelto  after discontinuation due to bleeding concerns post-surgery of elbow  Chronic obstructive pulmonary disease (COPD) Exacerbated post-COVID-19 infection in 2020. Previous use of prednisone  for exacerbations without adverse effects.    The ASCVD Risk score (Arnett DK, et al.,  2019) failed to calculate for the following reasons:   The 2019 ASCVD risk score is only valid for ages 25 to 90   Risk score cannot be calculated because patient has a medical history suggesting prior/existing ASCVD  Past Medical History:  Diagnosis Date   A-fib (HCC)    Acute deep vein thrombosis (DVT) of tibial vein of right lower extremity (HCC)    after ankle fracture/surgery (2/21)   Arthritis    RIGHT HIP, HANDS, BACK   Cancer (HCC)    Skin cancer - face, neck and back   Cataract    bilateral - removed   Cervical radiculopathy    severe C3-4 neuroforaminal stenosis, C5-6 left synovial cyst compressing left nerve root.   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  Patient wears oxygen  at night and when need during day   Pain    RIGHT HIP AND BACK   Personal history of colonic polyps-adenoma and sessile serrated polyp 07/15/2010   Pneumonia 03/2019   x several   RBBB    HX AFIB IN PAST- PAC'S   Shortness of breath    WITH EXERTION ONLY   Stroke (HCC)    TIA hx   Wears glasses    Past Surgical History:  Procedure Laterality Date   COLONOSCOPY     maybe 3    COLONOSCOPY W/ POLYPECTOMY     CYSTOSCOPY WITH RETROGRADE PYELOGRAM, URETEROSCOPY AND STENT PLACEMENT Left 12/11/2019   Procedure: CYSTOSCOPY WITH LEFT RETROGRADE LEFT  URETEROSCOPY WITH HOLMIUM LASER AND STENT PLACEMENT;  Surgeon: Watt Rush, MD;  Location: [facility];  Service: Urology;  Laterality: Left;   EYE SURGERY Bilateral    x 5   I & D EXTREMITY Left 07/20/2023   Procedure: LEFT ELBOW DEBRIDEMENT AND CLOSURE;  Surgeon: Harden Jerona GAILS, MD;  Location:  [facility] OR;  Service: Orthopedics;  Laterality: Left;   OLECRANON BURSECTOMY Left 07/01/2023   Procedure: EXCISION LEFT ELBOW OLECRANON (ELBOW) BURSA;  Surgeon: Harden Jerona GAILS, MD;  Location: [facility] OR;  Service: Orthopedics;  Laterality: Left;   ORIF ANKLE FRACTURE Right 07/11/2019   Procedure: OPEN REDUCTION INTERNAL FIXATION (ORIF) ANKLE FRACTURE;  Surgeon: Kendal Franky SQUIBB, MD;  Location: [facility] OR;  Service: Orthopedics;  Laterality: Right;   skin cancers removed      TOTAL HIP ARTHROPLASTY Right 01/09/2013   Procedure: RIGHT TOTAL HIP ARTHROPLASTY ANTERIOR APPROACH;  Surgeon: Donnice JONETTA Car, MD;  Location: [facility];  Service: Orthopedics;  Laterality: Right;   TOTAL HIP ARTHROPLASTY Left 02/24/2021   Procedure: TOTAL HIP ARTHROPLASTY ANTERIOR APPROACH;  Surgeon: Car Donnice, MD;  Location: [facility];  Service: Orthopedics;  Laterality: Left;   Social History   Tobacco Use   Smoking status: Former    Current packs/day: 0.00    Average packs/day: 2.0 packs/day for 57.0 years (114.0 ttl pk-yrs)    Types: Cigarettes    Start date: 05/24/1950    Quit date: 05/25/2007    Years since quitting: 16.6   Smokeless tobacco: Never   Tobacco comments:    Does not qualify due to age.   Vaping Use   Vaping status: Never Used  Substance Use Topics   Alcohol use: No    Alcohol/week: 0.0 standard drinks of alcohol   Drug use: No   Family History  Problem Relation Age of Onset   Cancer Mother        died more of old age at age 71   Cancer Father    Colon cancer Neg Hx    Esophageal cancer Neg Hx    Prostate cancer Neg Hx    Rectal cancer Neg Hx    Stomach cancer Neg Hx    No Known Allergies  Review of Systems  Musculoskeletal:  Positive for back pain.      Objective:    BP 122/72   Pulse 86   Temp 98.5 F (36.9 C)   Ht 5' 10 (1.778 m)   Wt 234 lb 5 oz (106.3 kg)   SpO2 98%   BMI 33.62 kg/m  BP Readings from Last 3 Encounters:  01/20/24 122/72  12/22/23 126/88  11/09/23 126/88   Wt  Readings from Last 3 Encounters:  01/20/24 234 lb 5 oz (106.3 kg)  12/22/23 230 lb (104.3 kg)  11/09/23 238 lb 4 oz (108.1 kg)    Physical Exam Vitals and nursing note reviewed.  Constitutional:      Appearance: Normal appearance.  HENT:     Head: Normocephalic.     Right Ear: Tympanic membrane, ear canal and external ear normal. There is no impacted cerumen.     Left Ear: Tympanic membrane, ear canal and external ear normal. There is no impacted cerumen.  Eyes:     Extraocular Movements: Extraocular movements intact.     Pupils: Pupils are equal, round, and reactive to light.  Cardiovascular:     Rate and Rhythm: Normal rate and regular rhythm.     Heart sounds: Normal heart sounds.  Pulmonary:     Effort: Pulmonary effort is normal.     Breath sounds: Normal breath sounds. No wheezing.  Musculoskeletal:     Lumbar back: Tenderness and bony tenderness present. Decreased range of motion. Positive left straight leg raise test. Negative right straight leg raise test.     Right hip: Normal.     Left hip: Decreased range of motion.     Right lower leg: No edema.     Left lower leg: No edema.     Comments: Back- decrease range of motion with forward flexion.  Left hip- does not have tenderness over the greater trochanter or groin to palpation. Patient can get on exam table with assistance.   Neurological:     General: No focal deficit present.     Mental Status: He is alert and oriented to person, place, and time.     Motor: No weakness.  Psychiatric:        Mood and Affect: Mood normal.        Behavior: Behavior normal.      No results found for any visits on 01/20/24.

## 2024-01-25 ENCOUNTER — Other Ambulatory Visit (HOSPITAL_COMMUNITY): Payer: Self-pay

## 2024-01-25 ENCOUNTER — Telehealth: Payer: Self-pay | Admitting: Pharmacy Technician

## 2024-01-25 NOTE — Telephone Encounter (Signed)
 Pharmacy Patient Advocate Encounter   Received notification from Onbase that prior authorization for Lidocaine  5% patches  is required/requested.   Insurance verification completed.   The patient is insured through [facility] .   Per test claim: PA required; PA submitted to above mentioned insurance via Latent Key/confirmation #/EOC [id number] Status is pending

## 2024-01-25 NOTE — Telephone Encounter (Signed)
 Pharmacy Patient Advocate Encounter  Received notification from [employer] that Prior Authorization for Lidocaine  5% patches  has been APPROVED from 01/25/24 to 05/23/24. Ran test claim, Copay is $0.00. This test claim was processed through [facility]- copay amounts may vary at other pharmacies due to pharmacy/plan contracts, or as the patient moves through the different stages of their insurance plan.   PA #/Case ID/Reference #: [id number]

## 2024-01-26 ENCOUNTER — Other Ambulatory Visit: Payer: Self-pay | Admitting: Family Medicine

## 2024-01-29 ENCOUNTER — Other Ambulatory Visit: Payer: Self-pay | Admitting: Family Medicine

## 2024-01-30 NOTE — Telephone Encounter (Signed)
 Requested Prescriptions  Refused Prescriptions Disp Refills   amLODipine  (NORVASC ) 5 MG tablet [Pharmacy Med Name: Amlodipine  Besylate 5mg  Tablet] 30 tablet 11    Sig: Take 1 tablet by mouth at bedtime     Cardiovascular: Calcium Channel Blockers 2 Passed - 01/30/2024  3:13 PM      Passed - Last BP in normal range    BP Readings from Last 1 Encounters:  01/20/24 122/72         Passed - Last Heart Rate in normal range    Pulse Readings from Last 1 Encounters:  01/20/24 86         Passed - Valid encounter within last 6 months    Recent Outpatient Visits           1 week ago Acute bilateral low back pain without sciatica   [place] [facility] Aletha Bene, MD   2 months ago Sensation of plugged ear on both sides   [place] [facility] Aletha Bene, MD   10 months ago Chronic obstructive pulmonary disease, unspecified COPD type [facility])   [place] [facility] Duanne Butler DASEN, MD   1 year ago Bilateral impacted cerumen   [place] [facility] Duanne Butler DASEN, MD   1 year ago Recurrent pneumonia   [place] [facility] Pickard, Butler DASEN, MD

## 2024-01-31 DIAGNOSIS — N1831 Chronic kidney disease, stage 3a: Secondary | ICD-10-CM | POA: Diagnosis not present

## 2024-01-31 DIAGNOSIS — J449 Chronic obstructive pulmonary disease, unspecified: Secondary | ICD-10-CM | POA: Diagnosis not present

## 2024-02-02 DIAGNOSIS — J449 Chronic obstructive pulmonary disease, unspecified: Secondary | ICD-10-CM | POA: Diagnosis not present

## 2024-02-04 ENCOUNTER — Other Ambulatory Visit: Payer: Self-pay | Admitting: Family Medicine

## 2024-02-04 DIAGNOSIS — K219 Gastro-esophageal reflux disease without esophagitis: Secondary | ICD-10-CM

## 2024-02-06 ENCOUNTER — Ambulatory Visit: Payer: Self-pay | Admitting: *Deleted

## 2024-02-06 NOTE — Telephone Encounter (Signed)
 Rx 04/05/23 #30 11RF- 1 year Rx- too soon Requested Prescriptions  Pending Prescriptions Disp Refills   pantoprazole  (PROTONIX ) 40 MG tablet [Pharmacy Med Name: Pantoprazole  Sodium 40mg  Delayed-Release Tablet] 30 tablet 11    Sig: Take 1 tablet by mouth every day     Gastroenterology: Proton Pump Inhibitors Passed - 02/06/2024  1:40 PM      Passed - Valid encounter within last 12 months    Recent Outpatient Visits           2 weeks ago Acute bilateral low back pain without sciatica   [place] [facility] Aletha Bene, MD   2 months ago Sensation of plugged ear on both sides   [place] [facility] Aletha Bene, MD   10 months ago Chronic obstructive pulmonary disease, unspecified COPD type [facility])   [place] [facility] Duanne Butler DASEN, MD   1 year ago Bilateral impacted cerumen   [place] [facility] Duanne Butler DASEN, MD   1 year ago Recurrent pneumonia    [facility] Pickard, Butler DASEN, MD

## 2024-02-06 NOTE — Telephone Encounter (Signed)
 FYI Only or Action Required?: Action required by provider: referral request and if OV needed or go to [facility].  Patient was last seen in primary care on 01/20/2024 by Aletha Bene, MD.  Called Nurse Triage reporting Back Pain.  Symptoms began several weeks ago.  Interventions attempted: Other: unknown.  Symptoms are: gradually worsening.  Triage Disposition: See PCP When Office is Open (Within 3 Days)  Patient/caregiver understands and will follow disposition?: No, wishes to speak with PCP              Copied from CRM #[id number]. Topic: Clinical - Red Word Triage >> Feb 06, 2024  2:29 PM Brendan Hopkins wrote: Patient has back pain. Pain level is 8-10. He is requesting a referral for Acupuncture Reason for Disposition  [1] MODERATE back pain (e.g., interferes with normal activities) AND [2] present > 3 days  Answer Assessment - Initial Assessment Questions Patient requesting referral for acupunture for back pain or recommendation if patient should go to [facility]. Recommended PCP may need to evaluate back pain in OV. Patient reports PCP already knows arthritis in back. Please advise. Recommended if pain worsens he can go to [facility].        1. ONSET: When did the pain begin? (e.g., minutes, hours, days)     3 weeks  2. LOCATION: Where does it hurt? (upper, mid or lower back)     Low back  3. SEVERITY: How bad is the pain?  (e.g., Scale 1-10; mild, moderate, or severe)     8/10 4. PATTERN: Is the pain constant? (e.g., yes, no; constant, intermittent)      Pain with standing  5. RADIATION: Does the pain shoot into your legs or somewhere else?     na 6. CAUSE:  What do you think is causing the back pain?      arthritis 7. BACK OVERUSE:  Any recent lifting of heavy objects, strenuous work or exercise?     Na  8. MEDICINES: What have you taken so far for the pain? (e.g., nothing, acetaminophen , NSAIDS)     Na  9. NEUROLOGIC SYMPTOMS: Do  you have any weakness, numbness, or problems with bowel/bladder control?     na 10. OTHER SYMPTOMS: Do you have any other symptoms? (e.g., fever, abdomen pain, burning with urination, blood in urine)       Low back pain worsening x 3 weeks ago as long as sitting no pain , requesting referral  11. PREGNANCY: Is there any chance you are pregnant? When was your last menstrual period?       na  Protocols used: Back Pain-A-AH

## 2024-02-07 ENCOUNTER — Telehealth: Payer: Self-pay

## 2024-02-07 NOTE — Telephone Encounter (Signed)
 Copied from CRM [id number]. Topic: General - Other >> Feb 07, 2024 12:00 PM [place] MATSU wrote: Reason for CRM: Patient called back to speak with Ronal Bradley. I called office to advise patient is returning her call.

## 2024-02-08 ENCOUNTER — Telehealth: Payer: Self-pay

## 2024-02-08 NOTE — Telephone Encounter (Signed)
 Received CMN from [facility] for oxygen  concerns.  CMN signed and faxed.  Received confirmation.

## 2024-02-09 ENCOUNTER — Encounter: Payer: Self-pay | Admitting: Family Medicine

## 2024-02-09 ENCOUNTER — Ambulatory Visit: Admitting: Family Medicine

## 2024-02-09 VITALS — BP 120/72 | HR 75 | Temp 97.6°F | Ht 70.0 in | Wt 231.0 lb

## 2024-02-09 DIAGNOSIS — M51362 Other intervertebral disc degeneration, lumbar region with discogenic back pain and lower extremity pain: Secondary | ICD-10-CM

## 2024-02-09 MED ORDER — HYDROCODONE-ACETAMINOPHEN 5-325 MG PO TABS: 1.0000 | ORAL_TABLET | Freq: Four times a day (QID) | ORAL | 0 refills | Status: DC | PRN

## 2024-02-09 NOTE — Progress Notes (Signed)
 Subjective:    Patient ID: Brendan Hopkins, male    DOB: June 26, 1935, 88 y.o.   MRN: [id number]  Patient recently saw my partner for low back pain.  She gave him tizanidine  and prednisone .  The pain is just as bad as before.  He saw no improvement.  The pain is located in a linear fashion roughly around the level of L4.  It radiates above his gluteus muscle on both sides.  He denies any numbness or tingling in his legs but he does report weakness in his legs.  Prolonged standing only exacerbates the pain.  He feels like his legs are getting weaker.  X-ray in 2023 showed severe levoscoliosis along with degenerative disc disease.  Patient is unable to take NSAIDs due to the fact he is on anticoagulation.  He has been taking Tylenol  and gabapentin  with minimal relief.  Past Medical History:  Diagnosis Date   A-fib [facility])    Acute deep vein thrombosis (DVT) of tibial vein of right lower extremity (HCC)    after ankle fracture/surgery (2/21)   Arthritis    RIGHT HIP, HANDS, BACK   Cancer (HCC)    Skin cancer - face, neck and back   Cataract    bilateral - removed   Cervical radiculopathy    severe C3-4 neuroforaminal stenosis, C5-6 left synovial cyst compressing left nerve root.   Chronic kidney disease    stage 3a   Colon polyps    2005   COPD (chronic obstructive pulmonary disease) (HCC)    SMOKED FOR 72 YRS   Cystoid macular degeneration of both eyes    Diverticulosis    2005   Fatty liver    GERD (gastroesophageal reflux disease)    Hard of hearing    has hearing aids   History of kidney stones    Hypertension    Leg fracture, right    Oxygen  dependent    2L via [place].  Patient wears oxygen  at night and when need during day   Pain    RIGHT HIP AND BACK   Personal history of colonic polyps-adenoma and sessile serrated polyp 07/15/2010   Pneumonia 03/2019   x several   RBBB    HX AFIB IN PAST- PAC'S   Shortness of breath    WITH EXERTION ONLY   Stroke (HCC)    TIA hx    Wears glasses    Past Surgical History:  Procedure Laterality Date   COLONOSCOPY     maybe 3    COLONOSCOPY W/ POLYPECTOMY     CYSTOSCOPY WITH RETROGRADE PYELOGRAM, URETEROSCOPY AND STENT PLACEMENT Left 12/11/2019   Procedure: CYSTOSCOPY WITH LEFT RETROGRADE LEFT  URETEROSCOPY WITH HOLMIUM LASER AND STENT PLACEMENT;  Surgeon: Watt Rush, MD;  Location: [facility];  Service: Urology;  Laterality: Left;   EYE SURGERY Bilateral    x 5   I & D EXTREMITY Left 07/20/2023   Procedure: LEFT ELBOW DEBRIDEMENT AND CLOSURE;  Surgeon: Harden Jerona GAILS, MD;  Location: [facility] OR;  Service: Orthopedics;  Laterality: Left;   OLECRANON BURSECTOMY Left 07/01/2023   Procedure: EXCISION LEFT ELBOW OLECRANON (ELBOW) BURSA;  Surgeon: Harden Jerona GAILS, MD;  Location: [facility] OR;  Service: Orthopedics;  Laterality: Left;   ORIF ANKLE FRACTURE Right 07/11/2019   Procedure: OPEN REDUCTION INTERNAL FIXATION (ORIF) ANKLE FRACTURE;  Surgeon: Kendal Franky SQUIBB, MD;  Location: [facility] OR;  Service: Orthopedics;  Laterality: Right;   skin cancers removed      TOTAL  HIP ARTHROPLASTY Right 01/09/2013   Procedure: RIGHT TOTAL HIP ARTHROPLASTY ANTERIOR APPROACH;  Surgeon: Donnice JONETTA Car, MD;  Location: [facility];  Service: Orthopedics;  Laterality: Right;   TOTAL HIP ARTHROPLASTY Left 02/24/2021   Procedure: TOTAL HIP ARTHROPLASTY ANTERIOR APPROACH;  Surgeon: Car Donnice, MD;  Location: [facility];  Service: Orthopedics;  Laterality: Left;       No Known Allergies Social History   Socioeconomic History   Marital status: Widowed    Spouse name: Not on file   Number of children: 3   Years of education: 55   Highest education level: Not on file  Occupational History   Occupation: Retired  Tobacco Use   Smoking status: Former    Current packs/day: 0.00    Average packs/day: 2.0 packs/day for 57.0 years (114.0 ttl pk-yrs)    Types: Cigarettes    Start date: 05/24/1950    Quit date: 05/25/2007    Years since quitting: 16.7   Smokeless tobacco:  Never   Tobacco comments:    Does not qualify due to age.   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   Social Drivers of Corporate investment banker Strain: Low Risk  (12/22/2023)   Overall Financial Resource Strain (CARDIA)    Difficulty of Paying Living Expenses: Not very hard  Food Insecurity: No Food Insecurity (12/22/2023)   Hunger Vital Sign    Worried About Running Out of Food in the Last Year: Never true    Ran Out of Food in the Last Year: Never true  Transportation Needs: No Transportation Needs (12/22/2023)   PRAPARE - Administrator, Civil Service (Medical): No    Lack of Transportation (Non-Medical): No  Physical Activity: Sufficiently Active (12/22/2023)   Exercise Vital Sign    Days of Exercise per Week: 5 days    Minutes of Exercise per Session: 40 min  Stress: No Stress Concern Present (12/22/2023)   [employer] of Occupational Health - Occupational Stress Questionnaire    Feeling of Stress: Not at all  Social Connections: Patient Declined (12/22/2023)   Social Connection and Isolation Panel    Frequency of Communication with Friends and Family: Patient declined    Frequency of Social Gatherings with Friends and Family: Patient declined    Attends Religious Services: Patient declined    Database administrator or Organizations: Patient declined    Attends Banker Meetings: Patient declined    Marital Status: Patient declined  Intimate Partner Violence: Not At Risk (12/22/2023)   Humiliation, Afraid, Rape, and Kick questionnaire    Fear of Current or Ex-Partner: No    Emotionally Abused: No    Physically Abused: No    Sexually Abused: No    Review of Systems      Objective:   Physical Exam Vitals reviewed.  Constitutional:      General: He is not in acute distress.    Appearance: Normal appearance. He is normal weight. He is not ill-appearing or toxic-appearing.  Eyes:     General: No visual field deficit. Cardiovascular:     Rate and Rhythm: Normal rate. Rhythm irregularly irregular.  Heart sounds: Normal heart sounds.  Pulmonary:     Effort: Pulmonary effort is normal. No respiratory distress.     Breath sounds: Decreased air movement present. No stridor. Decreased breath sounds and rales present. No wheezing or rhonchi.  Chest:     Chest wall: No tenderness.  Musculoskeletal:     Lumbar back: Spasms, tenderness and bony tenderness present. Decreased range of motion. Scoliosis present.       Back:     Right lower leg: No edema.     Left lower leg: No edema.  Skin:    Findings: No erythema or rash.  Neurological:     Mental Status: He is alert and oriented to person, place, and time.     Cranial Nerves: Cranial nerves 2-12 are intact. No cranial nerve deficit, dysarthria or facial asymmetry.     Sensory: Sensation is intact.     Motor: No weakness.          Assessment & Plan:  Degeneration of intervertebral disc of lumbar region with discogenic back pain and lower extremity pain I suspect the patient likely has spinal stenosis due to severe degenerative disc disease.  I believe some of the weakness in his legs could be neurogenic claudication.  He has an appointment to see orthopedics tomorrow to discuss epidural steroid injections which I believe would be beneficial.  He is a poor surgical candidate due to age and comorbidities.  He has already tried muscle relaxers, steroids, gabapentin , Tylenol .  He is unable to take NSAIDs.  Therefore I gave the patient a prescription for hydrocodone  5/325 1 every 6 hours as needed for pain.  I cautioned him to be careful taking this medication due to dizziness and falls and sedation.  I also  warned him about constipation

## 2024-02-09 NOTE — Addendum Note (Signed)
 Addended by: ANGELENA RONAL BRADLEY K on: 02/09/2024 04:29 PM   Modules accepted: Orders

## 2024-02-10 ENCOUNTER — Telehealth (HOSPITAL_BASED_OUTPATIENT_CLINIC_OR_DEPARTMENT_OTHER): Payer: Self-pay

## 2024-02-10 DIAGNOSIS — N1831 Chronic kidney disease, stage 3a: Secondary | ICD-10-CM | POA: Diagnosis not present

## 2024-02-10 DIAGNOSIS — M545 Low back pain, unspecified: Secondary | ICD-10-CM | POA: Diagnosis not present

## 2024-02-10 NOTE — Telephone Encounter (Signed)
 CMN received for oxygen  signed by provider and faxed confirmation received

## 2024-02-18 DIAGNOSIS — M545 Low back pain, unspecified: Secondary | ICD-10-CM | POA: Diagnosis not present

## 2024-02-21 ENCOUNTER — Ambulatory Visit (INDEPENDENT_AMBULATORY_CARE_PROVIDER_SITE_OTHER): Admitting: Family Medicine

## 2024-02-21 ENCOUNTER — Encounter: Payer: Self-pay | Admitting: Family Medicine

## 2024-02-21 VITALS — BP 118/62 | HR 68 | Temp 98.1°F | Ht 70.0 in | Wt 232.0 lb

## 2024-02-21 DIAGNOSIS — I4891 Unspecified atrial fibrillation: Secondary | ICD-10-CM | POA: Diagnosis not present

## 2024-02-21 DIAGNOSIS — I10 Essential (primary) hypertension: Secondary | ICD-10-CM | POA: Diagnosis not present

## 2024-02-21 DIAGNOSIS — Z7901 Long term (current) use of anticoagulants: Secondary | ICD-10-CM | POA: Diagnosis not present

## 2024-02-21 DIAGNOSIS — Z0001 Encounter for general adult medical examination with abnormal findings: Secondary | ICD-10-CM

## 2024-02-21 DIAGNOSIS — R7303 Prediabetes: Secondary | ICD-10-CM | POA: Diagnosis not present

## 2024-02-21 DIAGNOSIS — Z86718 Personal history of other venous thrombosis and embolism: Secondary | ICD-10-CM | POA: Diagnosis not present

## 2024-02-21 DIAGNOSIS — J449 Chronic obstructive pulmonary disease, unspecified: Secondary | ICD-10-CM

## 2024-02-21 DIAGNOSIS — Z0000 Encounter for general adult medical examination without abnormal findings: Secondary | ICD-10-CM

## 2024-02-21 MED ORDER — LOSARTAN POTASSIUM 50 MG PO TABS: 50.0000 mg | ORAL_TABLET | Freq: Every day | ORAL | 3 refills | Status: AC

## 2024-02-21 NOTE — Progress Notes (Signed)
 Subjective:    Patient ID: Brendan Hopkins, male    DOB: 11/15/1935, 88 y.o.   MRN: [id number]  Patient is here today for complete physical exam.  He has a history of atrial fibrillation, COPD, hypertension, history of DVT in his legs, chronic venous insufficiency, and chronic low back pain due to degenerative disc disease.  He continues to have pain in his lower back.  He recently had an MRI of his lower back and is scheduled to see his orthopedist on the 15th to discuss options for treatment.  He is currently taking Eliquis for stroke prevention.  Today on physical exam however he is in normal sinus rhythm.  He has some mild edema in both legs due to chronic venous insufficiency however he is also taking amlodipine  which is likely exacerbating that.  Due to his age she does not require prostate cancer screening or colon cancer screening.  He is due for the second shingles vaccine as well as COVID-vaccine.  Patient quit smoking 15 years ago.  I performed a CT scan of his lungs last year which showed a 6 mm pulmonary nodule.  The patient has an appointment to see his pulmonologist in November and defers to them to repeat any imaging of his lung. Past Medical History:  Diagnosis Date   A-fib [facility])    Acute deep vein thrombosis (DVT) of tibial vein of right lower extremity (HCC)    after ankle fracture/surgery (2/21)   Arthritis    RIGHT HIP, HANDS, BACK   Cancer (HCC)    Skin cancer - face, neck and back   Cataract    bilateral - removed   Cervical radiculopathy    severe C3-4 neuroforaminal stenosis, C5-6 left synovial cyst compressing left nerve root.   Chronic kidney disease    stage 3a   Colon polyps    2005   COPD (chronic obstructive pulmonary disease) (HCC)    SMOKED FOR 66 YRS   Cystoid macular degeneration of both eyes    Diverticulosis    2005   Fatty liver    GERD (gastroesophageal reflux disease)    Hard of hearing    has hearing aids   History of kidney stones     Hypertension    Leg fracture, right    Oxygen  dependent    2L via [place].  Patient wears oxygen  at night and when need during day   Pain    RIGHT HIP AND BACK   Personal history of colonic polyps-adenoma and sessile serrated polyp 07/15/2010   Pneumonia 03/2019   x several   RBBB    HX AFIB IN PAST- PAC'S   Shortness of breath    WITH EXERTION ONLY   Stroke (HCC)    TIA hx   Wears glasses    Past Surgical History:  Procedure Laterality Date   COLONOSCOPY     maybe 3    COLONOSCOPY W/ POLYPECTOMY     CYSTOSCOPY WITH RETROGRADE PYELOGRAM, URETEROSCOPY AND STENT PLACEMENT Left 12/11/2019   Procedure: CYSTOSCOPY WITH LEFT RETROGRADE LEFT  URETEROSCOPY WITH HOLMIUM LASER AND STENT PLACEMENT;  Surgeon: Watt Rush, MD;  [facility];  Service: Urology;  Laterality: Left;   EYE SURGERY Bilateral    x 5   I & D EXTREMITY Left 07/20/2023   Procedure: LEFT ELBOW DEBRIDEMENT AND CLOSURE;  Surgeon: Harden Jerona GAILS, MD;  Location: [facility] OR;  Service: Orthopedics;  Laterality: Left;   OLECRANON BURSECTOMY Left 07/01/2023  Procedure: EXCISION LEFT ELBOW OLECRANON (ELBOW) BURSA;  Surgeon: Harden Jerona GAILS, MD;  Location: [facility] OR;  Service: Orthopedics;  Laterality: Left;   ORIF ANKLE FRACTURE Right 07/11/2019   Procedure: OPEN REDUCTION INTERNAL FIXATION (ORIF) ANKLE FRACTURE;  Surgeon: Kendal Franky SQUIBB, MD;  Location[facility] OR;  Service: Orthopedics;  Laterality: Right;   skin cancers removed      TOTAL HIP ARTHROPLASTY Right 01/09/2013   Procedure: RIGHT TOTAL HIP ARTHROPLASTY ANTERIOR APPROACH;  Surgeon: Donnice JONETTA Car, MD;  [facility]: [facility];  Service: Orthopedics;  Laterality: Right;   TOTAL HIP ARTHROPLASTY Left 02/24/2021   Procedure: TOTAL HIP ARTHROPLASTY ANTERIOR APPROACH;  Surgeon: Car Donnice, MD;  Location: [facility];  Service: Orthopedics;  Laterality: Left;       No Known Allergies Social History   Socioeconomic History   Marital status: Widowed    Spouse name: Not on file   Number of  children: 3   Years of education: 45   Highest education level: Not on file  Occupational History   Occupation: Retired  Tobacco Use   Smoking status: Former    Current packs/day: 0.00    Average packs/day: 2.0 packs/day for 57.0 years (114.0 ttl pk-yrs)    Types: Cigarettes    Start date: 05/24/1950    Quit date: 05/25/2007    Years since quitting: 16.7   Smokeless tobacco: Never   Tobacco comments:    Does not qualify due to age.   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   Social Drivers of Corporate investment banker Strain: Low Risk  (12/22/2023)   Overall Financial Resource Strain (CARDIA)    Difficulty of Paying Living Expenses: Not very hard  Food Insecurity: No Food Insecurity (12/22/2023)   Hunger Vital Sign    Worried About Running Out of Food in the Last Year: Never true    Ran Out of Food in the Last Year: Never true  Transportation Needs: No Transportation Needs (12/22/2023)   PRAPARE - Administrator, Civil Service (Medical): No    Lack of Transportation (Non-Medical): No  Physical Activity: Sufficiently Active (12/22/2023)   Exercise Vital Sign    Days of Exercise per Week: 5 days    Minutes of Exercise per Session: 40 min  Stress: No Stress Concern Present (12/22/2023)   [employer] of Occupational Health - Occupational Stress Questionnaire    Feeling of Stress: Not at all  Social Connections: Patient Declined (12/22/2023)   Social Connection and Isolation Panel    Frequency of Communication with Friends and Family: Patient declined    Frequency of Social Gatherings with Friends and Family: Patient declined    Attends Religious Services: Patient declined    Doctor, general practice or Organizations: Patient declined    Attends Banker Meetings: Patient declined    Marital Status: Patient declined  Intimate Partner Violence: Not At Risk (12/22/2023)   Humiliation, Afraid, Rape, and Kick questionnaire    Fear of Current or Ex-Partner: No    Emotionally Abused: No    Physically Abused: No    Sexually Abused: No    Review of  Systems     Objective:   Physical Exam Vitals reviewed.  Constitutional:      General: He is not in acute distress.    Appearance: Normal appearance. He is normal weight. He is not ill-appearing or toxic-appearing.  HENT:     Right Ear: Tympanic membrane and ear canal normal.     Left Ear: Tympanic membrane and ear canal normal.     Nose: No congestion or rhinorrhea.     Mouth/Throat:     Pharynx: No oropharyngeal exudate or posterior oropharyngeal erythema.  Eyes:     General: No visual field deficit.    Extraocular Movements: Extraocular movements intact.     Conjunctiva/sclera: Conjunctivae normal.     Pupils: Pupils are equal, round, and reactive to light.  Neck:     Vascular: No carotid bruit.  Cardiovascular:     Rate and Rhythm: Normal rate and regular rhythm.     Heart sounds: Murmur heard.  Pulmonary:     Effort: Pulmonary effort is normal. No respiratory distress.     Breath sounds: Decreased air movement present. No stridor. Decreased breath sounds and rales present. No wheezing or rhonchi.  Chest:     Chest wall: No tenderness.  Abdominal:     General: Abdomen is flat. Bowel sounds are normal. There is no distension.     Palpations: Abdomen is soft.     Tenderness: There is no abdominal tenderness. There is no guarding or rebound.  Musculoskeletal:     Lumbar back: Spasms, tenderness and bony tenderness present. Decreased range of motion. Scoliosis present.       Back:     Right lower leg: No edema.     Left lower leg: No edema.  Lymphadenopathy:     Cervical: No cervical adenopathy.  Skin:     Coloration: Skin is not jaundiced.     Findings: No bruising, erythema, lesion or rash.  Neurological:     Mental Status: He is alert and oriented to person, place, and time.     Cranial Nerves: Cranial nerves 2-12 are intact. No cranial nerve deficit, dysarthria or facial asymmetry.     Sensory: Sensation is intact.     Motor: No weakness.          Assessment & Plan:  Primary hypertension - Plan: CBC with Differential/Platelet, Comprehensive metabolic panel with GFR, Lipid panel, TSH  Chronic obstructive pulmonary disease, unspecified COPD type (HCC) - Plan: CBC with Differential/Platelet, Comprehensive metabolic panel with GFR, Lipid panel, TSH  Hx of long term use of blood thinners - Plan: CBC with Differential/Platelet, Comprehensive metabolic panel with GFR, Lipid panel, TSH  Atrial fibrillation, unspecified type (HCC) - Plan: CBC with Differential/Platelet, Comprehensive metabolic panel with GFR, Lipid panel, TSH  History of DVT (deep vein thrombosis) - Plan: CBC with Differential/Platelet, Comprehensive metabolic panel with GFR, Lipid panel, TSH  General medical exam - Plan: CBC with Differential/Platelet, Comprehensive metabolic panel with GFR, Lipid panel, TSH Patient is appropriately anticoagulated with Eliquis.  He is in normal sinus rhythm today.  His blood pressure is well-controlled.  I recommended discontinuation of amlodipine  due to peripheral edema and replacing it with losartan 50 mg a day.  I encouraged him to continue to wear compression hose.  The patient received his second shingles vaccine.  He plans to get the COVID vaccination.  I will check his CBC, CMP, lipid panel, and TSH.

## 2024-02-23 ENCOUNTER — Ambulatory Visit: Payer: Self-pay | Admitting: Family Medicine

## 2024-02-23 NOTE — Progress Notes (Signed)
 Sent to [facility] to add on A1c.

## 2024-02-24 ENCOUNTER — Telehealth: Payer: Self-pay

## 2024-02-24 ENCOUNTER — Telehealth (HOSPITAL_BASED_OUTPATIENT_CLINIC_OR_DEPARTMENT_OTHER): Payer: Self-pay | Admitting: *Deleted

## 2024-02-24 DIAGNOSIS — M48062 Spinal stenosis, lumbar region with neurogenic claudication: Secondary | ICD-10-CM | POA: Diagnosis not present

## 2024-02-24 NOTE — Telephone Encounter (Signed)
   Pre-operative Risk Assessment    Patient Name: Brendan Hopkins  DOB: 1936-04-27 MRN: [id number]   Date of last office visit: 08/24/23 HAO MENG, PAC Date of next office visit: NONE   Request for Surgical Clearance    Procedure:  IL -EPIDURAL   Date of Surgery:  Clearance TBD                                Surgeon:  DR. SCOT FLATTEN Surgeon's Group or Practice Name:  [facility] Phone number:  [phone] Fax number:  [phone]; ANNITTA DARNEL, MA   Type of Clearance Requested:   - Medical  - Pharmacy:  Hold Rivaroxaban  (Xarelto )     Type of Anesthesia:  Not Indicated   Additional requests/questions:    Bonney Niels Jest   02/24/2024, 4:08 PM

## 2024-02-24 NOTE — Telephone Encounter (Signed)
 Copied from CRM [id number]. Topic: Clinical - Medication Prior Auth >> Feb 24, 2024  9:37 AM Har[facility] ORN wrote: Reason for CRM: Patient called. Requesting PA for injections in his back from Dr. Darra in [facility]. Please send in form request today so he can set up an appointment as soon as possible.

## 2024-02-24 NOTE — Telephone Encounter (Signed)
 Pharmacy please advise on holding Xarelto  prior to IL Epidural scheduled for TBD. Last labs 02/21/2024. Thank you.

## 2024-02-25 LAB — COMPREHENSIVE METABOLIC PANEL WITH GFR
AG Ratio: 1.5 (calc) (ref 1.0–2.5)
ALT: 15 U/L (ref 9–46)
AST: 19 U/L (ref 10–35)
Albumin: 4.1 g/dL (ref 3.6–5.1)
Alkaline phosphatase (APISO): 85 U/L (ref 35–144)
BUN: 20 mg/dL (ref 7–25)
CO2: 28 mmol/L (ref 20–32)
Calcium: 9.6 mg/dL (ref 8.6–10.3)
Chloride: 102 mmol/L (ref 98–110)
Creat: 1.06 mg/dL (ref 0.70–1.22)
Globulin: 2.8 g/dL (ref 1.9–3.7)
Glucose, Bld: 119 mg/dL — ABNORMAL HIGH (ref 65–99)
Potassium: 4.5 mmol/L (ref 3.5–5.3)
Sodium: 139 mmol/L (ref 135–146)
Total Bilirubin: 0.6 mg/dL (ref 0.2–1.2)
Total Protein: 6.9 g/dL (ref 6.1–8.1)
eGFR: 69 mL/min/1.73m2 (ref >=60)

## 2024-02-25 LAB — CBC WITH DIFFERENTIAL/PLATELET
Absolute Lymphocytes: 1934 {cells}/uL (ref 850–3900)
Absolute Monocytes: 632 {cells}/uL (ref 200–950)
Basophils Absolute: 62 {cells}/uL (ref 0–200)
Basophils Relative: 0.8 %
Eosinophils Absolute: 211 {cells}/uL (ref 15–500)
Eosinophils Relative: 2.7 %
HCT: 49.6 % (ref 38.5–50.0)
Hemoglobin: 16.8 g/dL (ref 13.2–17.1)
MCH: 33.5 pg — ABNORMAL HIGH (ref 27.0–33.0)
MCHC: 33.9 g/dL (ref 32.0–36.0)
MCV: 98.8 fL (ref 80.0–100.0)
MPV: 10.7 fL (ref 7.5–12.5)
Monocytes Relative: 8.1 %
Neutro Abs: 4961 {cells}/uL (ref 1500–7800)
Neutrophils Relative %: 63.6 %
Platelets: 244 Thousand/uL (ref 140–400)
RBC: 5.02 Million/uL (ref 4.20–5.80)
RDW: 12.8 % (ref 11.0–15.0)
Total Lymphocyte: 24.8 %
WBC: 7.8 Thousand/uL (ref 3.8–10.8)

## 2024-02-25 LAB — TEST AUTHORIZATION

## 2024-02-25 LAB — LIPID PANEL
Cholesterol: 188 mg/dL (ref <200)
HDL: 50 mg/dL (ref >=40)
LDL Cholesterol (Calc): 122 mg/dL — ABNORMAL HIGH
Non-HDL Cholesterol (Calc): 138 mg/dL — ABNORMAL HIGH (ref <130)
Total CHOL/HDL Ratio: 3.8 (calc) (ref <5.0)
Triglycerides: 72 mg/dL (ref <150)

## 2024-02-25 LAB — TSH: TSH: 3.95 m[IU]/L (ref 0.40–4.50)

## 2024-02-25 LAB — HEMOGLOBIN A1C
Hgb A1c MFr Bld: 5.9 % — ABNORMAL HIGH (ref <5.7)
Mean Plasma Glucose: 123 mg/dL
eAG (mmol/L): 6.8 mmol/L

## 2024-02-28 ENCOUNTER — Telehealth: Payer: Self-pay | Admitting: Family Medicine

## 2024-02-28 ENCOUNTER — Telehealth: Payer: Self-pay

## 2024-02-28 ENCOUNTER — Other Ambulatory Visit: Payer: Self-pay | Admitting: Family Medicine

## 2024-02-28 NOTE — Telephone Encounter (Signed)
 Appointment scheduled for 03/02/2024 @ 11:20am Med req and consent are complete. Call patient [phone].

## 2024-02-28 NOTE — Telephone Encounter (Signed)
   Name: Brendan Hopkins  DOB: 12/25/1935  MRN: [id number]  Primary Cardiologist: Redell Shallow, MD   Preoperative team, please contact this patient and set up a phone call appointment for further preoperative risk assessment. Please obtain consent and complete medication review. Thank you for your help.  I confirm that guidance regarding antiplatelet and oral anticoagulation therapy has been completed and, if necessary, noted below.  Patient has not had an Afib/aflutter ablation in the last 3 months, DCCV within the last 4 weeks or a watchman implanted in the last 45 days    Per office protocol, patient can hold Xarelto  for 3 days prior to procedure.   Patient will not need bridging with Lovenox  (enoxaparin ) around procedure.  I also confirmed the patient resides in the state of [place] . As per [facility] Medical Board telemedicine laws, the patient must reside in the state in which the provider is licensed.    Barnie Hila, NP 02/28/2024, 12:35 PM [facility]

## 2024-02-28 NOTE — Telephone Encounter (Signed)
 Copied from CRM [id number]. Topic: Clinical - Medication Prior Auth >> Feb 24, 2024  9:37 AM Har[facility] ORN wrote: Reason for CRM: Patient called. Requesting PA for injections in his back from Dr. Darra in [facility]. Please send in form request today so he can set up an appointment as soon as possible.

## 2024-02-28 NOTE — Telephone Encounter (Signed)
  Patient Consent for Virtual Visit         Brendan Hopkins has provided verbal consent on 02/28/2024 for a virtual visit (video or telephone). Appointment scheduled for 03/02/2024 @ 11:20am Med req and consent are complete. Call patient [phone].    CONSENT FOR VIRTUAL VISIT FOR:  Brendan Hopkins Elam  By participating in this virtual visit I agree to the following:  I hereby voluntarily request, consent and authorize [facility] and its employed or contracted physicians, physician assistants, nurse practitioners or other licensed health care professionals (the Practitioner), to provide me with telemedicine health care services (the "Services) as deemed necessary by the treating Practitioner. I acknowledge and consent to receive the Services by the Practitioner via telemedicine. I understand that the telemedicine visit will involve communicating with the Practitioner through live audiovisual communication technology and the disclosure of certain medical information by electronic transmission. I acknowledge that I have been given the opportunity to request an in-person assessment or other available alternative prior to the telemedicine visit and am voluntarily participating in the telemedicine visit.  I understand that I have the right to withhold or withdraw my consent to the use of telemedicine in the course of my care at any time, without affecting my right to future care or treatment, and that the Practitioner or I may terminate the telemedicine visit at any time. I understand that I have the right to inspect all information obtained and/or recorded in the course of the telemedicine visit and may receive copies of available information for a reasonable fee.  I understand that some of the potential risks of receiving the Services via telemedicine include:  Delay or interruption in medical evaluation due to technological equipment failure or disruption; Information transmitted may  not be sufficient (e.g. poor resolution of images) to allow for appropriate medical decision making by the Practitioner; and/or  In rare instances, security protocols could fail, causing a breach of personal health information.  Furthermore, I acknowledge that it is my responsibility to provide information about my medical history, conditions and care that is complete and accurate to the best of my ability. I acknowledge that Practitioner's advice, recommendations, and/or decision may be based on factors not within their control, such as incomplete or inaccurate data provided by me or distortions of diagnostic images or specimens that may result from electronic transmissions. I understand that the practice of medicine is not an exact science and that Practitioner makes no warranties or guarantees regarding treatment outcomes. I acknowledge that a copy of this consent can be made available to me via my patient portal [facility] MyChart), or I can request a printed copy by calling the office of [facility].    I understand that my insurance will be billed for this visit.   I have read or had this consent read to me. I understand the contents of this consent, which adequately explains the benefits and risks of the Services being provided via telemedicine.  I have been provided ample opportunity to ask questions regarding this consent and the Services and have had my questions answered to my satisfaction. I give my informed consent for the services to be provided through the use of telemedicine in my medical care

## 2024-02-28 NOTE — Telephone Encounter (Signed)
 Patient with diagnosis of atrial fibrillation on Xarelto  for anticoagulation.    Procedure:  IL -EPIDURAL    Date of Surgery:  Clearance TBD      CHA2DS2-VASc Score = 3   This indicates a 3.2% annual risk of stroke. The patient's score is based upon: CHF History: 0 HTN History: 1 Diabetes History: 0 Stroke History: 0 Vascular Disease History: 0 Age Score: 2 Gender Score: 0   Chart indicates history of Acute deep vein thrombosis (DVT) of tibial vein of right lower extremity (HCC) after ankle fracture/surgery (2/21)   CrCl 76 Platelet count 244  Patient has not had an Afib/aflutter ablation in the last 3 months, DCCV within the last 4 weeks or a watchman implanted in the last 45 days   Per office protocol, patient can hold Xarelto  for 3 days prior to procedure.   Patient will not need bridging with Lovenox  (enoxaparin ) around procedure.  **This guidance is not considered finalized until pre-operative APP has relayed final recommendations.**

## 2024-03-01 DIAGNOSIS — N1831 Chronic kidney disease, stage 3a: Secondary | ICD-10-CM | POA: Diagnosis not present

## 2024-03-01 DIAGNOSIS — J449 Chronic obstructive pulmonary disease, unspecified: Secondary | ICD-10-CM | POA: Diagnosis not present

## 2024-03-01 NOTE — Progress Notes (Unsigned)
 Virtual Visit via Telephone Note   Because of Brendan Hopkins co-morbid illnesses, he is at least at moderate risk for complications without adequate follow up.  This format is felt to be most appropriate for this patient at this time.  Due to technical limitations with video connection (technology), today's appointment will be conducted as an audio only telehealth visit, and Brendan Hopkins verbally agreed to proceed in this manner.   All issues noted in this document were discussed and addressed.  No physical exam could be performed with this format.  Evaluation Performed:  Preoperative cardiovascular risk assessment _____________   Date:  03/01/2024   Patient ID:  Brendan Hopkins, DOB [birth year], MRN [id number] Patient Location:  Home Provider location:   Office  Primary Care Provider:  Duanne Butler DASEN, MD Primary Cardiologist:  Redell Shallow, MD  Chief Complaint / Patient Profile   88 y.o. y/o male with a h/o cerebrovascular disease, HTN, DVT, paroxysmal atrial fibrillation who is pending IL-epidural and presents today for telephonic preoperative cardiovascular risk assessment.  History of Present Illness    Brendan Hopkins is a 88 y.o. male who presents via audio/video conferencing for a telehealth visit today.  Pt was last seen in cardiology clinic on 08/24/2023 by Scot Ford PA-C.  At that time Brendan Hopkins was doing well .  The patient is now pending procedure as outlined above. Since his last visit, he continues to be stable from a cardiac standpoint.  Today he denies chest pain, shortness of breath, lower extremity edema, fatigue, palpitations, melena, hematuria, hemoptysis, diaphoresis, weakness, presyncope, syncope, orthopnea, and PND.   Past Medical History    Past Medical History:  Diagnosis Date   A-fib [facility])    Acute deep vein thrombosis (DVT) of tibial vein of right lower extremity (HCC)    after ankle fracture/surgery (2/21)   Arthritis    RIGHT  HIP, HANDS, BACK   Cancer (HCC)    Skin cancer - face, neck and back   Cataract    bilateral - removed   Cervical radiculopathy    severe C3-4 neuroforaminal stenosis, C5-6 left synovial cyst compressing left nerve root.   Chronic kidney disease    stage 3a   Colon polyps    2005   COPD (chronic obstructive pulmonary disease) (HCC)    SMOKED FOR 82 YRS   Cystoid macular degeneration of both eyes    Diverticulosis    2005   Fatty liver    GERD (gastroesophageal reflux disease)    Hard of hearing    has hearing aids   History of kidney stones    Hypertension    Leg fracture, right    Oxygen  dependent    2L via [place].  Patient wears oxygen  at night and when need during day   Pain    RIGHT HIP AND BACK   Personal history of colonic polyps-adenoma and sessile serrated polyp 07/15/2010   Pneumonia 03/2019   x several   RBBB    HX AFIB IN PAST- PAC'S   Shortness of breath    WITH EXERTION ONLY   Stroke (HCC)    TIA hx   Wears glasses    Past Surgical History:  Procedure Laterality Date   COLONOSCOPY     maybe 3    COLONOSCOPY W/ POLYPECTOMY     CYSTOSCOPY WITH RETROGRADE PYELOGRAM, URETEROSCOPY AND STENT PLACEMENT Left 12/11/2019   Procedure: CYSTOSCOPY WITH LEFT RETROGRADE LEFT  URETEROSCOPY WITH HOLMIUM LASER  AND STENT PLACEMENT;  Surgeon: Watt Rush, MD;  Location: [facility];  Service: Urology;  Laterality: Left;   EYE SURGERY Bilateral    x 5   I & D EXTREMITY Left 07/20/2023   Procedure: LEFT ELBOW DEBRIDEMENT AND CLOSURE;  Surgeon: Harden Jerona GAILS, MD;  Location: [facility] OR;  Service: Orthopedics;  Laterality: Left;   OLECRANON BURSECTOMY Left 07/01/2023   Procedure: EXCISION LEFT ELBOW OLECRANON (ELBOW) BURSA;  Surgeon: Harden Jerona GAILS, MD;  Location: [facility] OR;  Service: Orthopedics;  Laterality: Left;   ORIF ANKLE FRACTURE Right 07/11/2019   Procedure: OPEN REDUCTION INTERNAL FIXATION (ORIF) ANKLE FRACTURE;  Surgeon: Kendal Franky SQUIBB, MD;  Location: [facility] OR;  Service: Orthopedics;   Laterality: Right;   skin cancers removed      TOTAL HIP ARTHROPLASTY Right 01/09/2013   Procedure: RIGHT TOTAL HIP ARTHROPLASTY ANTERIOR APPROACH;  Surgeon: Donnice JONETTA Car, MD;  Location: [facility];  Service: Orthopedics;  Laterality: Right;   TOTAL HIP ARTHROPLASTY Left 02/24/2021   Procedure: TOTAL HIP ARTHROPLASTY ANTERIOR APPROACH;  Surgeon: Car Donnice, MD;  Location: [facility];  Service: Orthopedics;  Laterality: Left;    Allergies  No Known Allergies  Home Medications    Prior to Admission medications   Medication Sig Start Date End Date Taking? Authorizing Provider  acetaminophen  (TYLENOL ) 650 MG CR tablet Take 650 mg by mouth 3 (three) times daily as needed for pain.    [provider]  albuterol  (VENTOLIN  HFA) 108 (90 Base) MCG/ACT inhaler Inhale 1 to 2 puffs by mouth every 6 hours as needed for shortness of breath 10/24/23   Duanne Butler DASEN, MD  Fluticasone -Umeclidin-Vilant (TRELEGY ELLIPTA ) 200-62.5-25 MCG/ACT AEPB INHALE 1 PUFF INTO LUNGS ONCE DAILY 01/20/24   Kassie Acquanetta Bradley, MD  furosemide  (LASIX ) 40 MG tablet Take 1 tablet by mouth once daily 05/30/23   Duanne Butler DASEN, MD  gabapentin  (NEURONTIN ) 300 MG capsule Take 1 capsule (300 mg total) by mouth 3 (three) times daily. TAKE 1 CAPSULE BY MOUTH THREE TIMES DAILY AS NEEDED FOR  SCIATICA 09/16/23   Duanne Butler DASEN, MD  HYDROcodone -acetaminophen  (NORCO/VICODIN) 5-325 MG tablet Take 1 tablet by mouth every 6 (six) hours as needed. 02/09/24   Duanne Butler DASEN, MD  Iron , Ferrous Sulfate , 325 (65 Fe) MG TABS Take 325 mg by mouth daily. 08/05/22   Duanne Butler DASEN, MD  losartan (COZAAR) 50 MG tablet Take 1 tablet (50 mg total) by mouth daily. 02/21/24   Duanne Butler DASEN, MD  meclizine  (ANTIVERT ) 25 MG tablet TAKE 1 TABLET BY MOUTH THREE TIMES DAILY AS NEEDED FOR DIZZINESS 01/26/24   Duanne Butler DASEN, MD  metoprolol  succinate (TOPROL -XL) 25 MG 24 hr tablet TAKE 1 TABLET BY MOUTH ONCE DAILY . APPOINTMENT REQUIRED FOR FUTURE  REFILLS 01/12/24   Duanne Butler DASEN, MD  Omega-3 Fatty Acids (FISH OIL) 1200 MG CAPS Take 1,200 mg by mouth every evening. Patient not taking: Reported on 02/21/2024    [provider]  oxyCODONE -acetaminophen  (PERCOCET/ROXICET) 5-325 MG tablet Take 1 tablet by mouth every 4 (four) hours as needed. 07/21/23   Harden Jerona GAILS, MD  OXYGEN  Inhale 2 L into the lungs daily as needed (Shortness of breath).    [provider]  pantoprazole  (PROTONIX ) 40 MG tablet Take 1 tablet by mouth every day 04/05/23   Duanne Butler DASEN, MD  predniSONE  (STERAPRED UNI-PAK 21 TAB) 10 MG (21) TBPK tablet Use as directed. Patient not taking: Reported on 02/21/2024 01/20/24   Aletha,  Rafaela, MD  Specialty Vitamins Products (MEMORY COMPLEX BRAIN HEALTH PO) Take 1 tablet by mouth daily.    [provider]  tiZANidine  (ZANAFLEX ) 4 MG tablet Take 1 tablet (4 mg total) by mouth every 6 (six) hours as needed for muscle spasms. 01/20/24   Aletha Bene, MD  XARELTO  20 MG TABS tablet Take 1 tablet by mouth once daily 01/05/24   Duanne Butler DASEN, MD  zolpidem  (AMBIEN ) 10 MG tablet Take 1 tablet by mouth at bedtime as needed for sleep 02/28/24   Duanne Butler DASEN, MD    Physical Exam    Vital Signs:  Brendan Hopkins does not have vital signs available for review today.  Given telephonic nature of communication, physical exam is limited. AAOx3. NAD. Normal affect.  Speech and respirations are unlabored.  Accessory Clinical Findings    None  Assessment & Plan    1.  Preoperative Cardiovascular Risk Assessment: IL -EPIDURAL    Date of Surgery:  Clearance TBD                                  Surgeon:  DR. SCOT FLATTEN Surgeon's Group or Practice Name:  [facility] Phone number:  [phone] Fax number:  [phone]; Brendan DARNEL, MA   Type of Clearance Requested:   - Medical  - Pharmacy:  Hold Rivaroxaban  (Xarelto )        Primary Cardiologist: Redell Shallow, MD  Chart reviewed as  part of pre-operative protocol coverage. Given past medical history and time since last visit, based on ACC/AHA guidelines, Brendan Hopkins would be at acceptable risk for the planned procedure without further cardiovascular testing.   Patient was advised that if he develops new symptoms prior to surgery to contact our office to arrange a follow-up appointment.  He verbalized understanding.  Patient has not had an Afib/aflutter ablation in the last 3 months, DCCV within the last 4 weeks or a watchman implanted in the last 45 days    Per office protocol, patient can hold Xarelto  for 3 days prior to procedure.   Patient will not need bridging with Lovenox  (enoxaparin ) around procedure.  I will route this recommendation to the requesting party via Epic fax function and remove from pre-op pool.       Time:   Today, I have spent 6 minutes with the patient with telehealth technology discussing medical history, symptoms, and management plan.  I spent 10 minutes reviewing patient's past cardiac history and cardiac medications.    Brendan CHRISTELLA Beauvais, NP  03/01/2024, 4:14 PM

## 2024-03-02 ENCOUNTER — Ambulatory Visit

## 2024-03-02 DIAGNOSIS — Z01810 Encounter for preprocedural cardiovascular examination: Secondary | ICD-10-CM

## 2024-03-03 DIAGNOSIS — J449 Chronic obstructive pulmonary disease, unspecified: Secondary | ICD-10-CM | POA: Diagnosis not present

## 2024-03-05 ENCOUNTER — Telehealth: Payer: Self-pay

## 2024-03-05 NOTE — Telephone Encounter (Signed)
 Copied from CRM [id number]. Topic: Clinical - Prescription Issue >> Mar 05, 2024 10:43 AM Darshell M wrote: Reason for CRM: Annitta from [facility] CB# [phone]. Sent request via fax last week to see if patient could discontinue Plavix. Has not received follow up.

## 2024-03-13 DIAGNOSIS — M48062 Spinal stenosis, lumbar region with neurogenic claudication: Secondary | ICD-10-CM | POA: Diagnosis not present

## 2024-03-14 ENCOUNTER — Other Ambulatory Visit: Payer: Self-pay | Admitting: Family Medicine

## 2024-03-15 ENCOUNTER — Other Ambulatory Visit: Payer: Self-pay | Admitting: Family Medicine

## 2024-03-15 DIAGNOSIS — K219 Gastro-esophageal reflux disease without esophagitis: Secondary | ICD-10-CM

## 2024-03-15 NOTE — Telephone Encounter (Unsigned)
 Copied from CRM #[id number]. Topic: Clinical - Medication Refill >> Mar 15, 2024 12:16 PM Joesph B wrote: Medication:  pantoprazole  (PROTONIX ) 40 MG tablet   Has the patient contacted their pharmacy? Yes (Agent: If no, request that the patient contact the pharmacy for the refill. If patient does not wish to contact the pharmacy document the reason why and proceed with request.) (Agent: If yes, when and what did the pharmacy advise?)  This is the patient's preferred pharmacy:   [facility] - [place], [place] - [address] [place] [address] Phone: [phone] Fax: [phone]  Is this the correct pharmacy for this prescription? Yes If no, delete pharmacy and type the correct one.   Has the prescription been filled recently? Yes  Is the patient out of the medication? No  Has the patient been seen for an appointment in the last year OR does the patient have an upcoming appointment? Yes  Can we respond through MyChart? No  Agent: Please be advised that Rx refills may take up to 3 business days. We ask that you follow-up with your pharmacy.

## 2024-03-16 MED ORDER — PANTOPRAZOLE SODIUM 40 MG PO TBEC: 40.0000 mg | DELAYED_RELEASE_TABLET | Freq: Every day | ORAL | 11 refills | Status: AC

## 2024-03-16 NOTE — Telephone Encounter (Signed)
 Requested Prescriptions  Pending Prescriptions Disp Refills   pantoprazole  (PROTONIX ) 40 MG tablet 30 tablet 11    Sig: Take 1 tablet (40 mg total) by mouth daily.     Gastroenterology: Proton Pump Inhibitors Passed - 03/16/2024  4:14 PM      Passed - Valid encounter within last 12 months    Recent Outpatient Visits           3 weeks ago Primary hypertension   [place] [facility] Duanne Butler DASEN, MD   1 month ago Degeneration of intervertebral disc of lumbar region with discogenic back pain and lower extremity pain   [place] [facility] Duanne Butler DASEN, MD   1 month ago Acute bilateral low back pain without sciatica   [place] [facility] [facility] Aletha Bene, MD   4 months ago Sensation of plugged ear on both sides   [place] [facility] Aletha Bene, MD   12 months ago Chronic obstructive pulmonary disease, unspecified COPD type [facility])   [place] [facility] Pickard, Butler DASEN, MD

## 2024-03-30 ENCOUNTER — Other Ambulatory Visit: Payer: Self-pay | Admitting: Family Medicine

## 2024-03-30 NOTE — Telephone Encounter (Signed)
 Requested Prescriptions  Refused Prescriptions Disp Refills   furosemide  (LASIX ) 40 MG tablet [Pharmacy Med Name: Furosemide  40mg  Tablet] 30 tablet 11    Sig: Take 1 tablet by mouth once daily     Cardiovascular:  Diuretics - Loop Failed - 03/30/2024  5:31 PM      Failed - Mg Level in normal range and within 180 days    Magnesium   Date Value Ref Range Status  08/27/2022 1.9 1.7 - 2.4 mg/dL Final    Comment:    Performed at [facility], [address] [place], [place] [address]         Passed - K in normal range and within 180 days    Potassium  Date Value Ref Range Status  02/21/2024 4.5 3.5 - 5.3 mmol/L Final         Passed - Ca in normal range and within 180 days    Calcium  Date Value Ref Range Status  02/21/2024 9.6 8.6 - 10.3 mg/dL Final   Calcium, Ion  Date Value Ref Range Status  07/03/2022 1.23 1.15 - 1.40 mmol/L Final         Passed - Na in normal range and within 180 days    Sodium  Date Value Ref Range Status  02/21/2024 139 135 - 146 mmol/L Final  02/01/2022 140 134 - 144 mmol/L Final         Passed - Cr in normal range and within 180 days    Creat  Date Value Ref Range Status  02/21/2024 1.06 0.70 - 1.22 mg/dL Final         Passed - Cl in normal range and within 180 days    Chloride  Date Value Ref Range Status  02/21/2024 102 98 - 110 mmol/L Final         Passed - Last BP in normal range    BP Readings from Last 1 Encounters:  02/21/24 118/62         Passed - Valid encounter within last 6 months    Recent Outpatient Visits           1 month ago Primary hypertension   [place] [facility] Duanne Butler DASEN, MD   1 month ago Degeneration of intervertebral disc of lumbar region with discogenic back pain and lower extremity pain   [place] [facility] Duanne Butler DASEN, MD   2 months ago Acute bilateral low back pain without sciatica   [place] [facility] Aletha Bene, MD    4 months ago Sensation of plugged ear on both sides   [place] [facility] Aletha Bene, MD   1 year ago Chronic obstructive pulmonary disease, unspecified COPD type [facility])   [place] [facility] Pickard, Butler DASEN, MD

## 2024-04-02 ENCOUNTER — Other Ambulatory Visit: Payer: Self-pay | Admitting: Family Medicine

## 2024-04-04 ENCOUNTER — Ambulatory Visit (HOSPITAL_BASED_OUTPATIENT_CLINIC_OR_DEPARTMENT_OTHER)
Admission: RE | Admit: 2024-04-04 | Discharge: 2024-04-04 | Disposition: A | Discharge status: Home / Self Care | Source: Ambulatory Visit | Attending: Nurse Practitioner | Admitting: Nurse Practitioner

## 2024-04-04 DIAGNOSIS — R918 Other nonspecific abnormal finding of lung field: Secondary | ICD-10-CM | POA: Insufficient documentation

## 2024-04-04 NOTE — Telephone Encounter (Signed)
 Requested medication (s) are due for refill today - yes  Requested medication (s) are on the active medication list -yes  Future visit scheduled -no  Last refill: 03/14/24 #30  Notes to clinic: non delegated Rx  Requested Prescriptions  Pending Prescriptions Disp Refills   meclizine  (ANTIVERT ) 25 MG tablet [Pharmacy Med Name: Meclizine  HCl 25 MG Oral Tablet] 30 tablet 0    Sig: TAKE 1 TABLET BY MOUTH THREE TIMES DAILY AS NEEDED FOR DIZZINESS     Not Delegated - Gastroenterology: Antiemetics Failed - 04/04/2024  2:48 PM      Failed - This refill cannot be delegated      Passed - Valid encounter within last 6 months    Recent Outpatient Visits           1 month ago Primary hypertension   [facility] Duanne Butler DASEN, MD   1 month ago Degeneration of intervertebral disc of lumbar region with discogenic back pain and lower extremity pain   [place] [facility] Duanne Butler DASEN, MD   2 months ago Acute bilateral low back pain without sciatica   [place] [facility] Aletha Bene, MD   4 months ago Sensation of plugged ear on both sides   [place] [facility] Aletha Bene, MD   1 year ago Chronic obstructive pulmonary disease, unspecified COPD type [facility])   [place] [facility] Pickard, Butler DASEN, MD              Signed Prescriptions Disp Refills   XARELTO  20 MG TABS tablet 90 tablet 0    Sig: Take 1 tablet by mouth once daily     Hematology: Anticoagulants - rivaroxaban  Passed - 04/04/2024  2:48 PM      Passed - ALT in normal range and within 360 days    ALT  Date Value Ref Range Status  02/21/2024 15 9 - 46 U/L Final         Passed - AST in normal range and within 360 days    AST  Date Value Ref Range Status  02/21/2024 19 10 - 35 U/L Final         Passed - Cr in normal range and within 360 days    Creat  Date Value Ref Range Status  02/21/2024  1.06 0.70 - 1.22 mg/dL Final         Passed - HCT in normal range and within 360 days    HCT  Date Value Ref Range Status  02/21/2024 49.6 38.5 - 50.0 % Final         Passed - HGB in normal range and within 360 days    Hemoglobin  Date Value Ref Range Status  02/21/2024 16.8 13.2 - 17.1 g/dL Final   Total hemoglobin  Date Value Ref Range Status  07/03/2022 8.9 (L) 12.0 - 16.0 g/dL Final         Passed - PLT in normal range and within 360 days    Platelets  Date Value Ref Range Status  02/21/2024 244 140 - 400 Thousand/uL Final         Passed - eGFR is 15 or above and within 360 days    GFR, Est African American  Date Value Ref Range Status  05/06/2020 77 > OR = 60 mL/min/1.36m2 Final   GFR, Est Non African American  Date Value Ref Range Status  05/06/2020 67 >  OR = 60 mL/min/1.98m2 Final   GFR, Estimated  Date Value Ref Range Status  07/01/2023 >60 >60 mL/min Final    Comment:    (NOTE) Calculated using the CKD-EPI Creatinine Equation (2021)    GFR  Date Value Ref Range Status  08/25/2022 58.51 (L) >60.00 mL/min Final    Comment:    Calculated using the CKD-EPI Creatinine Equation (2021)   eGFR  Date Value Ref Range Status  02/21/2024 69 > OR = 60 mL/min/1.36m2 Final  02/01/2022 61 >59 mL/min/1.73 Final         Passed - Patient is not pregnant      Passed - Valid encounter within last 12 months    Recent Outpatient Visits           1 month ago Primary hypertension   [place] [facility] Duanne Butler DASEN, MD   1 month ago Degeneration of intervertebral disc of lumbar region with discogenic back pain and lower extremity pain   [place] [facility] Duanne Butler DASEN, MD   2 months ago Acute bilateral low back pain without sciatica   [facility] Aletha Bene, MD   4 months ago Sensation of plugged ear on both sides   [place] [facility] Aletha Bene, MD   1  year ago Chronic obstructive pulmonary disease, unspecified COPD type [facility])   [place] [facility] Pickard, Butler DASEN, MD                 Requested Prescriptions  Pending Prescriptions Disp Refills   meclizine  (ANTIVERT ) 25 MG tablet [Pharmacy Med Name: Meclizine  HCl 25 MG Oral Tablet] 30 tablet 0    Sig: TAKE 1 TABLET BY MOUTH THREE TIMES DAILY AS NEEDED FOR DIZZINESS     Not Delegated - Gastroenterology: Antiemetics Failed - 04/04/2024  2:48 PM      Failed - This refill cannot be delegated      Passed - Valid encounter within last 6 months    Recent Outpatient Visits           1 month ago Primary hypertension   [place] [facility] Duanne Butler DASEN, MD   1 month ago Degeneration of intervertebral disc of lumbar region with discogenic back pain and lower extremity pain   [facility] Duanne Butler DASEN, MD   2 months ago Acute bilateral low back pain without sciatica   [place] [facility] Aletha Bene, MD   4 months ago Sensation of plugged ear on both sides   [place] [facility] Aletha Bene, MD   1 year ago Chronic obstructive pulmonary disease, unspecified COPD type [facility])   [place] [facility] Pickard, Butler DASEN, MD              Signed Prescriptions Disp Refills   XARELTO  20 MG TABS tablet 90 tablet 0    Sig: Take 1 tablet by mouth once daily     Hematology: Anticoagulants - rivaroxaban  Passed - 04/04/2024  2:48 PM      Passed - ALT in normal range and within 360 days    ALT  Date Value Ref Range Status  02/21/2024 15 9 - 46 U/L Final         Passed - AST in normal range and within 360 days    AST  Date Value Ref  Range Status  02/21/2024 19 10 - 35 U/L Final         Passed - Cr in normal range and within 360 days    Creat  Date Value Ref Range Status  02/21/2024 1.06 0.70 - 1.22 mg/dL Final         Passed - HCT in  normal range and within 360 days    HCT  Date Value Ref Range Status  02/21/2024 49.6 38.5 - 50.0 % Final         Passed - HGB in normal range and within 360 days    Hemoglobin  Date Value Ref Range Status  02/21/2024 16.8 13.2 - 17.1 g/dL Final   Total hemoglobin  Date Value Ref Range Status  07/03/2022 8.9 (L) 12.0 - 16.0 g/dL Final         Passed - PLT in normal range and within 360 days    Platelets  Date Value Ref Range Status  02/21/2024 244 140 - 400 Thousand/uL Final         Passed - eGFR is 15 or above and within 360 days    GFR, Est African American  Date Value Ref Range Status  05/06/2020 77 > OR = 60 mL/min/1.40m2 Final   GFR, Est Non African American  Date Value Ref Range Status  05/06/2020 67 > OR = 60 mL/min/1.88m2 Final   GFR, Estimated  Date Value Ref Range Status  07/01/2023 >60 >60 mL/min Final    Comment:    (NOTE) Calculated using the CKD-EPI Creatinine Equation (2021)    GFR  Date Value Ref Range Status  08/25/2022 58.51 (L) >60.00 mL/min Final    Comment:    Calculated using the CKD-EPI Creatinine Equation (2021)   eGFR  Date Value Ref Range Status  02/21/2024 69 > OR = 60 mL/min/1.90m2 Final  02/01/2022 61 >59 mL/min/1.73 Final         Passed - Patient is not pregnant      Passed - Valid encounter within last 12 months    Recent Outpatient Visits           1 month ago Primary hypertension   [place] [facility] Duanne Butler DASEN, MD   1 month ago Degeneration of intervertebral disc of lumbar region with discogenic back pain and lower extremity pain   [facility] Duanne Butler DASEN, MD   2 months ago Acute bilateral low back pain without sciatica   [place] [facility] Aletha Bene, MD   4 months ago Sensation of plugged ear on both sides   [place] [facility] Aletha Bene, MD   1 year ago Chronic obstructive pulmonary disease,  unspecified COPD type [facility])   [place] [facility] Pickard, Butler DASEN, MD

## 2024-04-04 NOTE — Telephone Encounter (Signed)
 Requested Prescriptions  Pending Prescriptions Disp Refills   meclizine  (ANTIVERT ) 25 MG tablet [Pharmacy Med Name: Meclizine  HCl 25 MG Oral Tablet] 30 tablet 0    Sig: TAKE 1 TABLET BY MOUTH THREE TIMES DAILY AS NEEDED FOR DIZZINESS     Not Delegated - Gastroenterology: Antiemetics Failed - 04/04/2024  2:48 PM      Failed - This refill cannot be delegated      Passed - Valid encounter within last 6 months    Recent Outpatient Visits           1 month ago Primary hypertension   [place] [facility] Duanne Butler DASEN, MD   1 month ago Degeneration of intervertebral disc of lumbar region with discogenic back pain and lower extremity pain   [place] [facility] Duanne Butler DASEN, MD   2 months ago Acute bilateral low back pain without sciatica   [place] [facility] Aletha Bene, MD   4 months ago Sensation of plugged ear on both sides   [place] [facility] Aletha Bene, MD   1 year ago Chronic obstructive pulmonary disease, unspecified COPD type [facility])   [place] [facility] Pickard, Butler DASEN, MD               XARELTO  20 MG TABS tablet [Pharmacy Med Name: Xarelto  20 MG Oral Tablet] 90 tablet 0    Sig: Take 1 tablet by mouth once daily     Hematology: Anticoagulants - rivaroxaban  Passed - 04/04/2024  2:48 PM      Passed - ALT in normal range and within 360 days    ALT  Date Value Ref Range Status  02/21/2024 15 9 - 46 U/L Final         Passed - AST in normal range and within 360 days    AST  Date Value Ref Range Status  02/21/2024 19 10 - 35 U/L Final         Passed - Cr in normal range and within 360 days    Creat  Date Value Ref Range Status  02/21/2024 1.06 0.70 - 1.22 mg/dL Final         Passed - HCT in normal range and within 360 days    HCT  Date Value Ref Range Status  02/21/2024 49.6 38.5 - 50.0 % Final         Passed - HGB in normal range and  within 360 days    Hemoglobin  Date Value Ref Range Status  02/21/2024 16.8 13.2 - 17.1 g/dL Final   Total hemoglobin  Date Value Ref Range Status  07/03/2022 8.9 (L) 12.0 - 16.0 g/dL Final         Passed - PLT in normal range and within 360 days    Platelets  Date Value Ref Range Status  02/21/2024 244 140 - 400 Thousand/uL Final         Passed - eGFR is 15 or above and within 360 days    GFR, Est African American  Date Value Ref Range Status  05/06/2020 77 > OR = 60 mL/min/1.60m2 Final   GFR, Est Non African American  Date Value Ref Range Status  05/06/2020 67 > OR = 60 mL/min/1.65m2 Final   GFR, Estimated  Date Value Ref Range Status  07/01/2023 >60 >60 mL/min Final    Comment:    (NOTE) Calculated using the CKD-EPI Creatinine Equation (2021)  GFR  Date Value Ref Range Status  08/25/2022 58.51 (L) >60.00 mL/min Final    Comment:    Calculated using the CKD-EPI Creatinine Equation (2021)   eGFR  Date Value Ref Range Status  02/21/2024 69 > OR = 60 mL/min/1.14m2 Final  02/01/2022 61 >59 mL/min/1.73 Final         Passed - Patient is not pregnant      Passed - Valid encounter within last 12 months    Recent Outpatient Visits           1 month ago Primary hypertension   [place] [facility] Duanne Butler DASEN, MD   1 month ago Degeneration of intervertebral disc of lumbar region with discogenic back pain and lower extremity pain   [place] [facility] Duanne Butler DASEN, MD   2 months ago Acute bilateral low back pain without sciatica   [place] [facility] Aletha Bene, MD   4 months ago Sensation of plugged ear on both sides   [place] [facility] Aletha Bene, MD   1 year ago Chronic obstructive pulmonary disease, unspecified COPD type [facility])   [place] [facility] Pickard, Butler DASEN, MD

## 2024-04-06 ENCOUNTER — Ambulatory Visit: Payer: Self-pay | Admitting: Nurse Practitioner

## 2024-04-06 NOTE — Progress Notes (Signed)
 Routing to Candis Dandy, [place] as FYI as well  CT chest showed stable chronic changes with mild heart enlargement and atherosclerosis, emphysema, calcified lymph nodes in the chest, and some stable nodules in the lungs. He does have a new 7 mm irregular RUL nodule and a new 3 mm right apical lung nodule. With the 7 mm nodule and hx of heavy smoking (114 pack yr hx), recommend repeat CT chest wo contrast in 3-4 months. Keep follow up as scheduled. Thanks!

## 2024-04-15 ENCOUNTER — Other Ambulatory Visit: Payer: Self-pay | Admitting: Family Medicine

## 2024-04-16 NOTE — Telephone Encounter (Signed)
 Requested Prescriptions  Pending Prescriptions Disp Refills   metoprolol  succinate (TOPROL -XL) 25 MG 24 hr tablet [Pharmacy Med Name: Metoprolol  Succinate ER 25 MG Oral Tablet Extended Release 24 Hour] 90 tablet 0    Sig: TAKE 1 TABLET BY MOUTH ONCE DAILY . APPOINTMENT REQUIRED FOR FUTURE REFILLS     Cardiovascular:  Beta Blockers Passed - 04/16/2024  4:07 PM      Passed - Last BP in normal range    BP Readings from Last 1 Encounters:  02/21/24 118/62         Passed - Last Heart Rate in normal range    Pulse Readings from Last 1 Encounters:  02/21/24 68         Passed - Valid encounter within last 6 months    Recent Outpatient Visits           1 month ago Primary hypertension   [place] [facility] Duanne Butler DASEN, MD   2 months ago Degeneration of intervertebral disc of lumbar region with discogenic back pain and lower extremity pain   [place] [facility] Duanne Butler DASEN, MD   2 months ago Acute bilateral low back pain without sciatica   [place] [facility] Aletha Bene, MD   5 months ago Sensation of plugged ear on both sides   [place] [facility] Aletha Bene, MD   1 year ago Chronic obstructive pulmonary disease, unspecified COPD type [facility])   [place] [facility] Pickard, Butler DASEN, MD

## 2024-04-18 ENCOUNTER — Telehealth: Payer: Self-pay | Admitting: Family Medicine

## 2024-04-18 ENCOUNTER — Other Ambulatory Visit: Payer: Self-pay

## 2024-04-18 DIAGNOSIS — M5432 Sciatica, left side: Secondary | ICD-10-CM

## 2024-04-18 MED ORDER — GABAPENTIN 300 MG PO CAPS: 300.0000 mg | ORAL_CAPSULE | Freq: Four times a day (QID) | ORAL | 5 refills | Status: AC

## 2024-04-18 NOTE — Telephone Encounter (Signed)
 Copied from CRM #[id number]. Topic: Clinical - Medication Question >> Apr 18, 2024 10:45 AM Brendan Hopkins wrote: Reason for CRM: Patient asking if he can increase the medications to 4 times a day for his back- gabapentin  (NEURONTIN ) 300 MG capsule  and Tylenol - please call [phone]- if he does not hear back he will increase

## 2024-04-20 ENCOUNTER — Other Ambulatory Visit: Payer: Self-pay | Admitting: Family Medicine

## 2024-04-30 ENCOUNTER — Other Ambulatory Visit: Payer: Self-pay | Admitting: Family Medicine

## 2024-05-02 ENCOUNTER — Ambulatory Visit (HOSPITAL_BASED_OUTPATIENT_CLINIC_OR_DEPARTMENT_OTHER)

## 2024-05-02 ENCOUNTER — Encounter (HOSPITAL_BASED_OUTPATIENT_CLINIC_OR_DEPARTMENT_OTHER): Payer: Self-pay

## 2024-05-02 ENCOUNTER — Ambulatory Visit (HOSPITAL_BASED_OUTPATIENT_CLINIC_OR_DEPARTMENT_OTHER): Admitting: Pulmonary Disease

## 2024-05-02 VITALS — BP 144/74 | HR 84 | Ht 70.0 in | Wt 234.0 lb

## 2024-05-02 DIAGNOSIS — Z87891 Personal history of nicotine dependence: Secondary | ICD-10-CM

## 2024-05-02 DIAGNOSIS — R918 Other nonspecific abnormal finding of lung field: Secondary | ICD-10-CM

## 2024-05-02 DIAGNOSIS — J9611 Chronic respiratory failure with hypoxia: Secondary | ICD-10-CM

## 2024-05-02 DIAGNOSIS — J439 Emphysema, unspecified: Secondary | ICD-10-CM

## 2024-05-02 NOTE — Progress Notes (Signed)
 @Patient  ID: Brendan Hopkins, male    DOB: Apr 06, 1936, 88 y.o.   MRN: [id number]  Chief Complaint  Patient presents with   COPD    Referring provider: Duanne Butler DASEN, MD  HPI: Discussed the use of AI scribe software for clinical note transcription with the patient, who gave verbal consent to proceed.  History of Present Illness Brendan Hopkins is an 88 year old male with COPD who presents for follow-up regarding his lung condition.  He has a history of moderate to severe chronic obstructive pulmonary disease (COPD). He uses albuterol  as a rescue inhaler, typically at night before bed, and Trelegy once daily in the morning. Oxygen  therapy is used at night. His shortness of breath has not worsened over the past couple of years, but he experiences two to three episodes of hard coughing during the day, producing yellowish-gray sputum. He forces himself to cough before bed to clear his airways, which takes about fifteen minutes.  A recent CT scan in November showed stable nodules in his lungs with one new small nodule in the right lung. He has not noticed any significant changes in his lung function or daily activities over the past year.  He sleeps with his head elevated on two pillows due to difficulty breathing when lying flat. He has a history of smoking, which he associates with his lung condition.  He recently received four injections in his lower back for pain management, which he has been experiencing for months. His ability to walk is sometimes limited by both his back pain and breathing difficulties. He has lost 22 pounds and aims to lose an additional 20 pounds to help with his breathing.  No issues with appetite, fever, chills, night sweats, or chest pain.  Last OV 10/04/2023:  [name] is an 88 year old male with chronic obstructive pulmonary disease (COPD) who presents for follow up.   His breathing has remained stable since the last visit. He continues to use  Trelegy daily and albuterol  at night before bed. He uses oxygen  at night but not consistently during the day.   He experiences a persistent cough, more pronounced in the morning and at night, with sputum described as 'yellow and grayish'. No hemoptysis. No worse than usual. He forces himself to cough at night to clear his lungs, which can take 10-15 minutes. Sometimes it can take longer. No fevers, chills.    He has leg problems, particularly with his left leg, following bilateral hip replacement surgeries. He reports difficulty walking normally sometimes and also his legs feel weaker with heavy exertion, which has been ongoing for a long time. No new worsening. He's not discussed this with his orthopedist.    He engages in exercise at the [facility] a few times a week. He does have to stop during aerobic exercises to catch his breath but is able to resume. He does not take his oxygen  with him. He's not checking his oxygen  levels either. He does note that his heart rate usually is higher during these episodes but most of the time recovers on his own. No CP, lightheadedness, dizziness.   A CT scan a year ago showed stable lung nodules, with a recommendation to repeat the scan in 18-24 months. He does have a significant smoking history. He's not sure he would be interested in any treatment, if there was a suspicious nodule/mass; however, he'd like to obtain follow up still and then decide if that issue arises.    He  mentions having hearing aids and inquires about ear wax buildup in his ears. No hearing changes.   87 year old male, former smoker followed for COPD and chronic respiratory failure. He is a patient of Dr. Laymond and last seen in office 06/09/2023. Past medical history significant for hx of CVA, hx of DVT, PAF on Xarelto , HTN, GERD, CKD, HLD, insomnia.    TEST/EVENTS:  Chest CT 03/2024: IMPRESSION: 1. New 3 mm right apical lung nodule and new irregular morphology right upper lobe  nodule measuring 7 mm. These are possibly inflammatory or infectious, six-month CT follow-up is recommended. Other small scattered pulmonary nodules are stable. 2. Emphysema. 3. Aortic atherosclerosis.  10/08/2022 CT chest: atherosclerosis/CAD. Emphysema. Pleural thickening. 6 mm nodular opacity in RUL, unchanged. 6 mm RLL nodule, unchanged. Atelectasis lung basis R>L. Kidney cyst. Stable 1.4 cm nodule left adrenal adenoma.   12/13/2022 PFT: FVC 77, FEV1 84, ratio 75, TLC 72, DLCO 44. No BD  No Known Allergies  Immunization History  Administered Date(s) Administered   Fluad Quad(high Dose 65+) 01/25/2019, 02/21/2020, 02/10/2021   INFLUENZA, HIGH DOSE SEASONAL PF 02/17/2018, 01/19/2023, 02/03/2024   Influenza Split 02/15/2011   Influenza, Seasonal, Injecte, Preservative Fre 02/15/2011, 02/06/2022   Influenza,inj,Quad PF,6+ Mos 04/09/2013, 02/12/2014, 03/14/2015, 03/04/2016, 03/02/2017   Influenza-Unspecified 04/23/2003, 03/20/2004, 03/10/2005, 03/24/2006, 03/10/2007, 01/27/2023   Moderna Covid-19 Fall Seasonal Vaccine 35yrs & older 03/08/2022   PFIZER Comirnaty(Gray Top)Covid-19 Tri-Sucrose Vaccine 11/07/2020   PFIZER(Purple Top)SARS-COV-2 Vaccination 08/16/2019, 09/06/2019, 03/17/2020, 11/07/2020   PNEUMOCOCCAL CONJUGATE-20 10/17/2022   Pfizer Covid-19 Vaccine Bivalent Booster 26yrs & up 03/24/2021   Pfizer(Comirnaty)Fall Seasonal Vaccine 12 years and older 02/14/2023   Pneumococcal Conjugate-13 04/09/2013   Pneumococcal Polysaccharide-23 12/19/2014   Respiratory Syncytial Virus Vaccine,Recomb Aduvanted(Arexvy) 02/06/2022   Td 09/10/2003   Td (Adult),unspecified 09/10/2003   Tdap 10/17/2022   Zoster Recombinant(Shingrix) 11/07/2020, 02/02/2021    Past Medical History:  Diagnosis Date   A-fib (HCC)    Acute deep vein thrombosis (DVT) of tibial vein of right lower extremity (HCC)    after ankle fracture/surgery (2/21)   Arthritis    RIGHT HIP, HANDS, BACK   Cancer (HCC)    Skin  cancer - face, neck and back   Cataract    bilateral - removed   Cervical radiculopathy    severe C3-4 neuroforaminal stenosis, C5-6 left synovial cyst compressing left nerve root.   Chronic kidney disease    stage 3a   Colon polyps    2005   COPD (chronic obstructive pulmonary disease) (HCC)    SMOKED FOR 62 YRS   Cystoid macular degeneration of both eyes    Diverticulosis    2005   Fatty liver    GERD (gastroesophageal reflux disease)    Hard of hearing    has hearing aids   History of kidney stones    Hypertension    Leg fracture, right    Oxygen  dependent    2L via [place].  Patient wears oxygen  at night and when need during day   Pain    RIGHT HIP AND BACK   Personal history of colonic polyps-adenoma and sessile serrated polyp 07/15/2010   Pneumonia 03/2019   x several   RBBB    HX AFIB IN PAST- PAC'S   Shortness of breath    WITH EXERTION ONLY   Stroke (HCC)    TIA hx   Wears glasses     Tobacco History: Social History   Tobacco Use  Smoking Status Former  Current packs/day: 0.00   Average packs/day: 2.0 packs/day for 57.0 years (114.0 ttl pk-yrs)   Types: Cigarettes   Start date: 05/24/1950   Quit date: 05/25/2007   Years since quitting: 16.9  Smokeless Tobacco Never  Tobacco Comments   Does not qualify due to age.    Counseling given: Not Answered Tobacco comments: Does not qualify due to age.    Outpatient Medications Prior to Visit  Medication Sig Dispense Refill   acetaminophen  (TYLENOL ) 650 MG CR tablet Take 650 mg by mouth 3 (three) times daily as needed for pain.     albuterol  (VENTOLIN  HFA) 108 (90 Base) MCG/ACT inhaler Inhale 1 to 2 puffs by mouth every 6 hours as needed for shortness of breath 6.7 each 11   Fluticasone -Umeclidin-Vilant (TRELEGY ELLIPTA ) 200-62.5-25 MCG/ACT AEPB INHALE 1 PUFF INTO LUNGS ONCE DAILY 60 each 3   furosemide  (LASIX ) 40 MG tablet Take 1 tablet by mouth once daily 30 tablet 11   gabapentin  (NEURONTIN ) 300 MG capsule Take  1 capsule (300 mg total) by mouth 4 (four) times daily. AS NEEDED FOR  SCIATICA 120 capsule 5   Iron , Ferrous Sulfate , 325 (65 Fe) MG TABS Take 325 mg by mouth daily. 30 tablet 1   losartan  (COZAAR ) 50 MG tablet Take 1 tablet (50 mg total) by mouth daily. 90 tablet 3   meclizine  (ANTIVERT ) 25 MG tablet TAKE 1 TABLET BY MOUTH THREE TIMES DAILY AS NEEDED FOR DIZZINESS 30 tablet 0   metoprolol  succinate (TOPROL -XL) 25 MG 24 hr tablet TAKE 1 TABLET BY MOUTH ONCE DAILY . APPOINTMENT REQUIRED FOR FUTURE REFILLS 90 tablet 0   oxyCODONE -acetaminophen  (PERCOCET/ROXICET) 5-325 MG tablet Take 1 tablet by mouth every 4 (four) hours as needed. 30 tablet 0   OXYGEN  Inhale 2 L into the lungs daily as needed (Shortness of breath).     pantoprazole  (PROTONIX ) 40 MG tablet Take 1 tablet (40 mg total) by mouth daily. 30 tablet 11   Specialty Vitamins Products (MEMORY COMPLEX BRAIN HEALTH PO) Take 1 tablet by mouth daily.     XARELTO  20 MG TABS tablet Take 1 tablet by mouth once daily 90 tablet 0   zolpidem  (AMBIEN ) 10 MG tablet Take 1 tablet by mouth at bedtime as needed for sleep 30 tablet 5   HYDROcodone -acetaminophen  (NORCO/VICODIN) 5-325 MG tablet Take 1 tablet by mouth every 6 (six) hours as needed. 30 tablet 0   Omega-3 Fatty Acids (FISH OIL) 1200 MG CAPS Take 1,200 mg by mouth every evening. (Patient not taking: Reported on 02/21/2024)     predniSONE  (STERAPRED UNI-PAK 21 TAB) 10 MG (21) TBPK tablet Use as directed. (Patient not taking: Reported on 02/21/2024) 21 each 0   tiZANidine  (ZANAFLEX ) 4 MG tablet Take 1 tablet (4 mg total) by mouth every 6 (six) hours as needed for muscle spasms. 30 tablet 0   No facility-administered medications prior to visit.     Review of Systems: as per hpi  Constitutional:   No  weight loss, night sweats,  Fevers, chills, fatigue, or  lassitude.  HEENT:   No headaches,  Difficulty swallowing,  Tooth/dental problems, or  Sore throat,                No sneezing, itching, ear  ache, nasal congestion, post nasal drip,   CV:  No chest pain,  Orthopnea, PND, swelling in lower extremities, anasarca, dizziness, palpitations, syncope.   GI  No heartburn, indigestion, abdominal pain, nausea, vomiting, diarrhea, change in bowel habits,  loss of appetite, bloody stools.   Resp: No shortness of breath with exertion or at rest.  No excess mucus, no productive cough,  No non-productive cough,  No coughing up of blood.  No change in color of mucus.  No wheezing.  No chest wall deformity  Skin: no rash or lesions.  GU: no dysuria, change in color of urine, no urgency or frequency.  No flank pain, no hematuria   MS:  No joint pain or swelling.  No decreased range of motion.  No back pain.    Physical Exam  BP (!) 144/74   Pulse 84   Ht 5' 10 (1.778 m)   Wt 234 lb (106.1 kg)   SpO2 90%   BMI 33.58 kg/m   GEN: A/Ox3; pleasant , NAD, well nourished.  Speaks in full sentences.   HEENT:  [place]/AT,  EACs-clear, TMs-wnl, NOSE-clear, THROAT-clear, no lesions, no postnasal drip or exudate noted.   NECK:  Supple w/ fair ROM; no JVD; normal carotid impulses w/o bruits; no thyromegaly or nodules palpated; no lymphadenopathy.    RESP  Clear  P & A; w/o, wheezes/ rales/ or rhonchi. no accessory muscle use, no dullness to percussion  CARD:  RRR, no m/r/g, no peripheral edema, pulses intact, no cyanosis or clubbing.  GI:   Soft & nt; nml bowel sounds; no organomegaly or masses detected.   Musco: Warm bil, no deformities or joint swelling noted.   Neuro: alert, no focal deficits noted.    Skin: Warm, no lesions or rashes    Lab Results:  CBC    Component Value Date/Time   WBC 7.8 02/21/2024 1140   RBC 5.02 02/21/2024 1140   HGB 16.8 02/21/2024 1140   HCT 49.6 02/21/2024 1140   PLT 244 02/21/2024 1140   MCV 98.8 02/21/2024 1140   MCH 33.5 (H) 02/21/2024 1140   MCHC 33.9 02/21/2024 1140   RDW 12.8 02/21/2024 1140   LYMPHSABS 0.7 07/01/2023 1724   MONOABS 0.1  07/01/2023 1724   EOSABS 211 02/21/2024 1140   BASOSABS 62 02/21/2024 1140    BMET    Component Value Date/Time   NA 139 02/21/2024 1140   NA 140 02/01/2022 1422   K 4.5 02/21/2024 1140   CL 102 02/21/2024 1140   CO2 28 02/21/2024 1140   GLUCOSE 119 (H) 02/21/2024 1140   BUN 20 02/21/2024 1140   BUN 18 02/01/2022 1422   CREATININE 1.06 02/21/2024 1140   CALCIUM 9.6 02/21/2024 1140   GFRNONAA >60 07/01/2023 0642   GFRNONAA 67 05/06/2020 1246   GFRAA 77 05/06/2020 1246    BNP    Component Value Date/Time   BNP 259.0 (H) 08/27/2022 2036   BNP 59 05/06/2020 1246    ProBNP No results found for: PROBNP  Imaging: CT Chest Wo Contrast Result Date: 04/05/2024 CLINICAL DATA:  Follow-up lung nodule EXAM: CT CHEST WITHOUT CONTRAST TECHNIQUE: Multidetector CT imaging of the chest was performed following the standard protocol without IV contrast. RADIATION DOSE REDUCTION: This exam was performed according to the departmental dose-optimization program which includes automated exposure control, adjustment of the mA and/or kV according to patient size and/or use of iterative reconstruction technique. COMPARISON:  CT 10/08/2022, 08/27/2022, 07/03/2022 FINDINGS: Cardiovascular: Limited evaluation without intravenous contrast. Moderate aortic atherosclerosis. No aneurysm. Coronary vascular calcification. Mild cardiomegaly. No pericardial effusion. Aortic valvular calcification. Mediastinum/Nodes: Patent trachea. No thyroid  mass. No suspicious lymph nodes. Few calcified lymph nodes consistent with prior granulomatous disease. Esophagus within normal limits  Lungs/Pleura: Emphysema. Partial atelectasis at the left base as before. No pleural effusion. 6 mm right lower lobe pulmonary nodule on series 3, image 78 is stable with multiple prior exams and consistent with benign nodule, no imaging follow-up is recommended. New 3 mm right apical lung nodule on series 3, image 27. New irregular morphology  right upper lobe nodule measuring 7 mm on series 3, image 35. Other scattered small pulmonary nodules are stable Upper Abdomen: No acute finding. Cortical scarring in the left kidney. Kidney stones. Stable nodularity of left adrenal gland. Musculoskeletal: No acute osseous abnormality. Multilevel degenerative changes IMPRESSION: 1. New 3 mm right apical lung nodule and new irregular morphology right upper lobe nodule measuring 7 mm. These are possibly inflammatory or infectious, six-month CT follow-up is recommended. Other small scattered pulmonary nodules are stable. 2. Emphysema. 3. Aortic atherosclerosis. Aortic Atherosclerosis (ICD10-I70.0) and Emphysema (ICD10-J43.9). Electronically Signed   By: Luke Bun M.D.   On: 04/05/2024 23:42    Administration History     None          Latest Ref Rng & Units 12/13/2022    8:03 AM  PFT Results  FVC-Pre L 2.73   FVC-Predicted Pre % 77   FVC-Post L 2.82   FVC-Predicted Post % 80   Pre FEV1/FVC % % 76   Post FEV1/FCV % % 75   FEV1-Pre L 2.08   FEV1-Predicted Pre % 84   FEV1-Post L 2.10   DLCO uncorrected ml/min/mmHg 9.90   DLCO UNC% % 44   DLVA Predicted % 60   TLC L 4.81   TLC % Predicted % 72   RV % Predicted % 58     No results found for: NITRICOXIDE   Assessment & Plan:   Assessment & Plan COPD with chronic bronchitis and emphysema (HCC)  Lung nodules  Chronic hypoxemic respiratory failure (HCC)  Assessment and Plan Assessment & Plan Chronic obstructive pulmonary disease with chronic bronchitis and emphysema Moderate to severe COPD with stable symptoms and functional status. Imaging shows a new small nodule in the right lung. - Continue Trelegy daily. - Use albuterol  as needed for dyspnea. - Continue nocturnal oxygen  therapy. - Encouraged deep breathing exercises and coughing to clear secretions. - Ordered follow-up CT scan in 3 months to monitor lung nodules.  Chronic hypoxemic respiratory failure Stable with  nocturnal oxygen  therapy. - Continue nocturnal oxygen  therapy.  Pulmonary nodules Multiple nodules, some stable, with a new 3 mm nodule in the right lung. All 7 mm or less. - Ordered follow-up CT scan in 3 months to monitor nodules.   Return in about 4 months (around 08/31/2024) for CT results.  Candis Dandy, PA-C 05/02/2024

## 2024-05-02 NOTE — Patient Instructions (Signed)
 Complete Chest CT as ordered in 3 months; follow up after completed.  Continue Trelegy 200 daily.  Continue Albuterol  every 4-6 hours as needed.  Continue cough/deep breathing exercises as you have been doing.

## 2024-05-03 ENCOUNTER — Encounter (HOSPITAL_COMMUNITY): Payer: Self-pay

## 2024-05-03 ENCOUNTER — Ambulatory Visit (HOSPITAL_COMMUNITY): Admission: EM | Admit: 2024-05-03 | Discharge: 2024-05-03 | Disposition: A | Discharge status: Home / Self Care

## 2024-05-03 ENCOUNTER — Emergency Department (HOSPITAL_COMMUNITY)
Admission: EM | Admit: 2024-05-03 | Discharge: 2024-05-04 | Disposition: A | Discharge status: Home / Self Care | Attending: Emergency Medicine | Admitting: Emergency Medicine

## 2024-05-03 DIAGNOSIS — Y9339 Activity, other involving climbing, rappelling and jumping off: Secondary | ICD-10-CM | POA: Insufficient documentation

## 2024-05-03 DIAGNOSIS — Z7901 Long term (current) use of anticoagulants: Secondary | ICD-10-CM | POA: Insufficient documentation

## 2024-05-03 DIAGNOSIS — W11XXXA Fall on and from ladder, initial encounter: Secondary | ICD-10-CM | POA: Insufficient documentation

## 2024-05-03 DIAGNOSIS — S81811A Laceration without foreign body, right lower leg, initial encounter: Secondary | ICD-10-CM

## 2024-05-03 NOTE — Discharge Instructions (Signed)
 Please keep a pressure bandage on the laceration until it stops bleeding.  Take the bandage off at least twice daily and clean the wound with soap and water .  You can apply a thin layer of bacitracin ointment over the wound and nonadherent gauze so it does not stick to the wound.  Seek care if bleeding worsens or if you develop signs or symptoms of infection including redness, swelling around the wound, drainage from the wound.
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
## 2024-05-03 NOTE — ED Triage Notes (Signed)
 Pt states on a ladder and rt leg gave out and cut his RLE. Active bleeding noted, pt on blood thinners. Pressure dressing applied.

## 2024-05-03 NOTE — ED Triage Notes (Addendum)
 Pt states he was climbing up a ladder in his home and his hip gave out and he fell and slammed leg against the ladder and the aluminum  ladder cut his leg, he has been bleeding since about 130 PM.States he went to urgent care first and they sent him here. Pt is on Xarelto .

## 2024-05-04 LAB — BASIC METABOLIC PANEL WITH GFR
Anion gap: 12 (ref 5–15)
BUN: 29 mg/dL — ABNORMAL HIGH (ref 8–23)
CO2: 23 mmol/L (ref 22–32)
Calcium: 8.8 mg/dL — ABNORMAL LOW (ref 8.9–10.3)
Chloride: 103 mmol/L (ref 98–111)
Creatinine, Ser: 1.2 mg/dL (ref 0.61–1.24)
GFR, Estimated: 59 mL/min — ABNORMAL LOW (ref >60)
Glucose, Bld: 132 mg/dL — ABNORMAL HIGH (ref 70–99)
Potassium: 4.4 mmol/L (ref 3.5–5.1)
Sodium: 138 mmol/L (ref 135–145)

## 2024-05-04 LAB — CBC WITH DIFFERENTIAL/PLATELET
Abs Immature Granulocytes: 0.06 K/uL (ref 0.00–0.07)
Basophils Absolute: 0 K/uL (ref 0.0–0.1)
Basophils Relative: 0 %
Eosinophils Absolute: 0 K/uL (ref 0.0–0.5)
Eosinophils Relative: 0 %
HCT: 38.7 % — ABNORMAL LOW (ref 39.0–52.0)
Hemoglobin: 13 g/dL (ref 13.0–17.0)
Immature Granulocytes: 1 %
Lymphocytes Relative: 15 %
Lymphs Abs: 1.8 K/uL (ref 0.7–4.0)
MCH: 33.8 pg (ref 26.0–34.0)
MCHC: 33.6 g/dL (ref 30.0–36.0)
MCV: 100.5 fL — ABNORMAL HIGH (ref 80.0–100.0)
Monocytes Absolute: 1 K/uL (ref 0.1–1.0)
Monocytes Relative: 8 %
Neutro Abs: 9.7 K/uL — ABNORMAL HIGH (ref 1.7–7.7)
Neutrophils Relative %: 76 %
Platelets: 223 K/uL (ref 150–400)
RBC: 3.85 MIL/uL — ABNORMAL LOW (ref 4.22–5.81)
RDW: 13.3 % (ref 11.5–15.5)
WBC: 12.7 K/uL — ABNORMAL HIGH (ref 4.0–10.5)
nRBC: 0 % (ref 0.0–0.2)

## 2024-05-04 LAB — PROTIME-INR
INR: 1.4 — ABNORMAL HIGH (ref 0.8–1.2)
Prothrombin Time: 18 s — ABNORMAL HIGH (ref 11.4–15.2)

## 2024-05-04 MED ORDER — LIDOCAINE-EPINEPHRINE 1 %-1:100000 IJ SOLN
30.0000 mL | Freq: Once | INTRAMUSCULAR | Status: AC
  Administered 2024-05-04: 30 mL via INTRADERMAL
  Filled 2024-05-04: qty 1

## 2024-05-04 MED ORDER — HYDROMORPHONE HCL 1 MG/ML IJ SOLN
0.5000 mg | Freq: Once | INTRAMUSCULAR | Status: AC
  Administered 2024-05-04: 0.5 mg via INTRAVENOUS
  Filled 2024-05-04: qty 1

## 2024-05-04 MED ORDER — TRANEXAMIC ACID 1000 MG/10ML IV SOLN
500.0000 mg | Freq: Once | INTRAVENOUS | Status: AC
  Administered 2024-05-04: 500 mg via TOPICAL
  Filled 2024-05-04: qty 10

## 2024-05-04 NOTE — ED Notes (Signed)
 Pt was given cab voucher due to leaving his wallet at home and needing a safe way to get home

## 2024-05-04 NOTE — ED Notes (Signed)
 Pt was able to ambulate for 79ft with no assistance

## 2024-05-04 NOTE — ED Provider Notes (Signed)
 [place] [facility] Provider Note   CSN: [id number] Arrival date & time: 05/03/24  2338     History Chief Complaint  Patient presents with   Laceration    Lac to the right lower leg. Bleeding uncontrollably     HPI Brendan Hopkins is a 88 y.o. male presenting for chief complaint of left leg laceration.  He states that he fell down steps on a ladder earlier today.  Large right lower leg laceration.  Has been bleeding for 7 hours.  Was starting to get a little bit lightheaded so comes in for further care and management.  Has changed the bandage 4 times.  Denies fevers chills nausea vomit syncope or shortness of breath..   Patient's recorded medical, surgical, social, medication list and allergies were reviewed in the Snapshot window as part of the initial history.   Review of Systems   Review of Systems  Constitutional:  Negative for chills and fever.  HENT:  Negative for ear pain and sore throat.   Eyes:  Negative for pain and visual disturbance.  Respiratory:  Negative for cough and shortness of breath.   Cardiovascular:  Negative for chest pain and palpitations.  Gastrointestinal:  Negative for abdominal pain and vomiting.  Genitourinary:  Negative for dysuria and hematuria.  Musculoskeletal:  Negative for arthralgias and back pain.  Skin:  Negative for color change and rash.  Neurological:  Negative for seizures and syncope.  All other systems reviewed and are negative.   Physical Exam Updated Vital Signs BP (!) 127/90   Pulse 84   Temp 98.7 F (37.1 C) (Oral)   Resp 18   Ht 5' 10 (1.778 m)   Wt 106.1 kg   SpO2 97%   BMI 33.57 kg/m  Physical Exam Vitals and nursing note reviewed.  Constitutional:      General: He is not in acute distress.    Appearance: He is well-developed.  HENT:     Head: Normocephalic and atraumatic.  Eyes:     Conjunctiva/sclera: Conjunctivae normal.  Cardiovascular:     Rate and Rhythm: Normal  rate and regular rhythm.  Pulmonary:     Effort: Pulmonary effort is normal. No respiratory distress.  Abdominal:     General: Abdomen is flat. There is no distension.  Musculoskeletal:        General: Tenderness and signs of injury present. No swelling or deformity.  Skin:    General: Skin is warm and dry.     Capillary Refill: Capillary refill takes less than 2 seconds.  Neurological:     Mental Status: He is alert and oriented to person, place, and time. Mental status is at baseline.      ED Course/ Medical Decision Making/ A&P    Procedures Procedures   Medications Ordered in ED Medications  HYDROmorphone  (DILAUDID ) injection 0.5 mg (0.5 mg Intravenous Given 05/04/24 0130)  tranexamic acid  (CYKLOKAPRON ) injection 500 mg (500 mg Topical Given 05/04/24 0246)  lidocaine -EPINEPHrine (XYLOCAINE  W/EPI) 1 %-1:100000 (with pres) injection 30 mL (30 mLs Intradermal Given 05/04/24 0245)    Medical Decision Making:   88 year old male with complicated leg laceration on blood thinners.  Lab work performed shows no focal pathology.  Hemoglobin stable.  And epinephrine soaked dressing was placed under pressure and left for 2 hours.  Patient had normal pulses in the distal extremity. Dressing was reevaluated and wound is now hemostatic.  A second epinephrine soaked dressing was placed to the site  and patient ambulated on this dressing with no breakthrough bleeding. Given establishment of hemostasis with medical therapies, patient stable for outpatient care and management.  He is ambulatory tolerating p.o. intake and feels comfortable discharge at this time.  No development of hypotension or anemia on lab work.  Patient discharged.  Stated his tetanus was up-to-date. Clinical Impression:  1. Laceration of right lower extremity, initial encounter      Discharge   Final Clinical Impression(s) / ED Diagnoses Final diagnoses:  Laceration of right lower extremity, initial encounter    Rx /  DC Orders ED Discharge Orders     None         Jerral Meth, MD 05/04/24 [phone]

## 2024-05-09 ENCOUNTER — Telehealth: Payer: Self-pay

## 2024-05-09 ENCOUNTER — Other Ambulatory Visit: Payer: Self-pay

## 2024-05-09 NOTE — Telephone Encounter (Signed)
 Copied from CRM #[id number]. Topic: Clinical - Prescription Issue >> May 09, 2024 11:03 AM Rea ORN wrote: Reason for CRM: Leslee with [facility] called to see if clinic received fax for pt Furosemide  rx. When verifying fax number, I let her know the number she sent it to was incorrect and gave correct fax number. Leslee said she will refax the request again and would like clinic to be on the look out for the fax.

## 2024-05-10 ENCOUNTER — Ambulatory Visit: Payer: Self-pay

## 2024-05-10 ENCOUNTER — Ambulatory Visit: Admitting: Family Medicine

## 2024-05-10 VITALS — BP 94/65 | HR 76 | Temp 97.5°F | Ht 70.0 in | Wt 232.0 lb

## 2024-05-10 DIAGNOSIS — S81811D Laceration without foreign body, right lower leg, subsequent encounter: Secondary | ICD-10-CM | POA: Diagnosis not present

## 2024-05-10 NOTE — Telephone Encounter (Signed)
 FYI Only or Action Required?: FYI only for provider: appointment scheduled on 05/10/2024 at 4:15 PM.  Patient was last seen in primary care on 02/21/2024 by Duanne Butler DASEN, MD.  Called Nurse Triage reporting Leg Pain (D/t pressure dressing from complicated laceration).  Symptoms began 05/03/2024.  Interventions attempted: Rest, hydration, or home remedies.  Symptoms are: unchanged.  Triage Disposition: See HCP Within 4 Hours (Or PCP Triage)  Patient/caregiver understands and will follow disposition?: Yes  Copied from CRM #[id number]. Topic: Clinical - Red Word Triage >> May 10, 2024  8:43 AM Brendan Hopkins wrote: Red Word that prompted transfer to Nurse Triage: Last Thursday, the patient was working on a house and while on a  ladder his hip gave out. The patient struck his right leg on the ladder , resulting in a deep laceration with significant bleeding. The patient reports ongoing swelling and pain . Reason for Disposition  [1] SEVERE pain (e.g., excruciating, unable to do any normal activities) AND [2] not improved after 2 hours of pain medicine  Answer Assessment - Initial Assessment Questions Patient with a complicated laceration to right leg. Patient ended up hit his leg on a ladder on 05/03/2024 and needing pressure dressings for the laceration.   1. ONSET: When did the pain start?      Pain is due to pressure dressing to right leg 2. LOCATION: Where is the pain located?      Right leg where laceration is located 3. PAIN: How bad is the pain?    (Scale 1-10; or mild, moderate, severe)     2 4. WORK OR EXERCISE: Has there been any recent work or exercise that involved this part of the body?      Patient struck his leg on a ladder and cut his leg.  5. CAUSE: What do you think is causing the leg pain?     Pressure dressing for complicated laceration 6. OTHER SYMPTOMS: Do you have any other symptoms? (e.g., chest pain, back pain, breathing difficulty, swelling, rash,  fever, numbness, weakness)     Swelling to leg  Protocols used: Leg Pain-A-AH

## 2024-05-10 NOTE — Progress Notes (Unsigned)
 Acute Office Visit  Patient ID: GRASON BRAILSFORD, male    DOB: 28-May-1935, 88 y.o.   MRN: [id number]  PCP: Duanne Butler DASEN, MD  Chief Complaint  Patient presents with   Acute Visit    Needs bandage change on rt leg cut      Subjective:     HPI  Discussed the use of AI scribe software for clinical note transcription with the patient, who gave verbal consent to proceed.  History of Present Illness    ROS  Past Medical History:  Diagnosis Date   A-fib (HCC)    Acute deep vein thrombosis (DVT) of tibial vein of right lower extremity (HCC)    after ankle fracture/surgery (2/21)   Arthritis    RIGHT HIP, HANDS, BACK   Cancer (HCC)    Skin cancer - face, neck and back   Cataract    bilateral - removed   Cervical radiculopathy    severe C3-4 neuroforaminal stenosis, C5-6 left synovial cyst compressing left nerve root.   Chronic kidney disease    stage 3a   Colon polyps    2005   COPD (chronic obstructive pulmonary disease) (HCC)    SMOKED FOR 49 YRS   Cystoid macular degeneration of both eyes    Diverticulosis    2005   Fatty liver    GERD (gastroesophageal reflux disease)    Hard of hearing    has hearing aids   History of kidney stones    Hypertension    Leg fracture, right    Oxygen  dependent    2L via [place].  Patient wears oxygen  at night and when need during day   Pain    RIGHT HIP AND BACK   Personal history of colonic polyps-adenoma and sessile serrated polyp 07/15/2010   Pneumonia 03/2019   x several   RBBB    HX AFIB IN PAST- PAC'S   Shortness of breath    WITH EXERTION ONLY   Stroke (HCC)    TIA hx   Wears glasses     Past Surgical History:  Procedure Laterality Date   COLONOSCOPY     maybe 3    COLONOSCOPY W/ POLYPECTOMY     CYSTOSCOPY WITH RETROGRADE PYELOGRAM, URETEROSCOPY AND STENT PLACEMENT Left 12/11/2019   Procedure: CYSTOSCOPY WITH LEFT RETROGRADE LEFT  URETEROSCOPY WITH HOLMIUM LASER AND STENT PLACEMENT;  Surgeon: Watt Rush,  MD;  Location: [facility];  Service: Urology;  Laterality: Left;   EYE SURGERY Bilateral    x 5   I & D EXTREMITY Left 07/20/2023   Procedure: LEFT ELBOW DEBRIDEMENT AND CLOSURE;  Surgeon: Harden Jerona GAILS, MD;  Location: [facility] OR;  Service: Orthopedics;  Laterality: Left;   OLECRANON BURSECTOMY Left 07/01/2023   Procedure: EXCISION LEFT ELBOW OLECRANON (ELBOW) BURSA;  Surgeon: Harden Jerona GAILS, MD;  Location: [facility] OR;  Service: Orthopedics;  Laterality: Left;   ORIF ANKLE FRACTURE Right 07/11/2019   Procedure: OPEN REDUCTION INTERNAL FIXATION (ORIF) ANKLE FRACTURE;  Surgeon: Kendal Franky SQUIBB, MD;  Location: [facility] OR;  Service: Orthopedics;  Laterality: Right;   skin cancers removed      TOTAL HIP ARTHROPLASTY Right 01/09/2013   Procedure: RIGHT TOTAL HIP ARTHROPLASTY ANTERIOR APPROACH;  Surgeon: Donnice JONETTA Car, MD;  Location: [facility];  Service: Orthopedics;  Laterality: Right;   TOTAL HIP ARTHROPLASTY Left 02/24/2021   Procedure: TOTAL HIP ARTHROPLASTY ANTERIOR APPROACH;  Surgeon: Car Donnice, MD;  Location: [facility];  Service: Orthopedics;  Laterality: Left;  Outpatient Medications Prior to Visit  Medication Sig Dispense Refill   acetaminophen  (TYLENOL ) 650 MG CR tablet Take 650 mg by mouth 3 (three) times daily as needed for pain.     albuterol  (VENTOLIN  HFA) 108 (90 Base) MCG/ACT inhaler Inhale 1 to 2 puffs by mouth every 6 hours as needed for shortness of breath 6.7 each 11   Fluticasone -Umeclidin-Vilant (TRELEGY ELLIPTA ) 200-62.5-25 MCG/ACT AEPB INHALE 1 PUFF INTO LUNGS ONCE DAILY 60 each 3   furosemide  (LASIX ) 40 MG tablet Take 1 tablet by mouth once daily 30 tablet 11   gabapentin  (NEURONTIN ) 300 MG capsule Take 1 capsule (300 mg total) by mouth 4 (four) times daily. AS NEEDED FOR  SCIATICA 120 capsule 5   Iron , Ferrous Sulfate , 325 (65 Fe) MG TABS Take 325 mg by mouth daily. 30 tablet 1   losartan  (COZAAR ) 50 MG tablet Take 1 tablet (50 mg total) by mouth daily. 90 tablet 3   meclizine  (ANTIVERT ) 25  MG tablet TAKE 1 TABLET BY MOUTH THREE TIMES DAILY AS NEEDED FOR DIZZINESS 30 tablet 0   metoprolol  succinate (TOPROL -XL) 25 MG 24 hr tablet TAKE 1 TABLET BY MOUTH ONCE DAILY . APPOINTMENT REQUIRED FOR FUTURE REFILLS 90 tablet 0   OXYGEN  Inhale 2 L into the lungs daily as needed (Shortness of breath).     pantoprazole  (PROTONIX ) 40 MG tablet Take 1 tablet (40 mg total) by mouth daily. 30 tablet 11   Specialty Vitamins Products (MEMORY COMPLEX BRAIN HEALTH PO) Take 1 tablet by mouth daily.     XARELTO  20 MG TABS tablet Take 1 tablet by mouth once daily 90 tablet 0   zolpidem  (AMBIEN ) 10 MG tablet Take 1 tablet by mouth at bedtime as needed for sleep 30 tablet 5   No facility-administered medications prior to visit.    Allergies[1]     Objective:    BP 94/65   Pulse 76   Temp (!) 97.5 F (36.4 C)   Ht 5' 10 (1.778 m)   Wt 232 lb (105.2 kg)   SpO2 96%   BMI 33.29 kg/m  {Vitals History (Optional):23777}  Physical Exam  {PhysExam Abridge (Optional):210964309}  No results found for any visits on 05/10/24.     Assessment & Plan:   Problem List Items Addressed This Visit   None   Assessment and Plan Assessment & Plan     No orders of the defined types were placed in this encounter.   No follow-ups on file.  Jeoffrey GORMAN Barrio, FNP [facility]      [1] No Known Allergies

## 2024-05-11 ENCOUNTER — Encounter: Payer: Self-pay | Admitting: Family Medicine

## 2024-05-11 ENCOUNTER — Other Ambulatory Visit: Payer: Self-pay | Admitting: Family Medicine

## 2024-05-11 DIAGNOSIS — S81811A Laceration without foreign body, right lower leg, initial encounter: Secondary | ICD-10-CM | POA: Insufficient documentation

## 2024-05-11 NOTE — Telephone Encounter (Unsigned)
 Copied from CRM #[id number]. Topic: Clinical - Medication Refill >> May 11, 2024 12:26 PM Fonda T wrote: Medication: furosemide  (LASIX ) 40 MG tablet  Has the patient contacted their pharmacy? Yes, pharmacy calling to request refill on behalf of pt   This is the patient's preferred pharmacy:   [facility] - [place], [place] - [address] [place] [address] Phone: [phone] Fax: [phone]  Is this the correct pharmacy for this prescription? Yes If no, delete pharmacy and type the correct one.   Has the prescription been filled recently? Yes  Is the patient out of the medication? No  Has the patient been seen for an appointment in the last year OR does the patient have an upcoming appointment? Yes  Can we respond through MyChart? No  Agent: Please be advised that Rx refills may take up to 3 business days. We ask that you follow-up with your pharmacy.

## 2024-05-14 ENCOUNTER — Other Ambulatory Visit: Payer: Self-pay | Admitting: Family Medicine

## 2024-05-14 ENCOUNTER — Ambulatory Visit

## 2024-05-15 MED ORDER — FUROSEMIDE 40 MG PO TABS: 40.0000 mg | ORAL_TABLET | Freq: Every day | ORAL | 2 refills | Status: DC

## 2024-05-15 NOTE — Telephone Encounter (Signed)
 OV 02/21/24 Requested Prescriptions  Pending Prescriptions Disp Refills   furosemide  (LASIX ) 40 MG tablet 30 tablet 3    Sig: Take 1 tablet (40 mg total) by mouth daily.     Cardiovascular:  Diuretics - Loop Failed - 05/15/2024  2:26 PM      Failed - Ca in normal range and within 180 days    Calcium  Date Value Ref Range Status  05/04/2024 8.8 (L) 8.9 - 10.3 mg/dL Final   Calcium, Ion  Date Value Ref Range Status  07/03/2022 1.23 1.15 - 1.40 mmol/L Final         Failed - Mg Level in normal range and within 180 days    Magnesium   Date Value Ref Range Status  08/27/2022 1.9 1.7 - 2.4 mg/dL Final    Comment:    Performed at [facility], [address], [place], [place] [address]         Passed - K in normal range and within 180 days    Potassium  Date Value Ref Range Status  05/04/2024 4.4 3.5 - 5.1 mmol/L Final         Passed - Na in normal range and within 180 days    Sodium  Date Value Ref Range Status  05/04/2024 138 135 - 145 mmol/L Final  02/01/2022 140 134 - 144 mmol/L Final         Passed - Cr in normal range and within 180 days    Creat  Date Value Ref Range Status  02/21/2024 1.06 0.70 - 1.22 mg/dL Final   Creatinine, Ser  Date Value Ref Range Status  05/04/2024 1.20 0.61 - 1.24 mg/dL Final         Passed - Cl in normal range and within 180 days    Chloride  Date Value Ref Range Status  05/04/2024 103 98 - 111 mmol/L Final         Passed - Last BP in normal range    BP Readings from Last 1 Encounters:  05/10/24 94/65         Passed - Valid encounter within last 6 months    Recent Outpatient Visits           5 days ago Skin tear of right lower leg without complication, subsequent encounter   [place] [facility] Kayla Jeoffrey RAMAN, FNP   2 months ago Primary hypertension   [place] [facility] Duanne Butler DASEN, MD   3 months ago Degeneration of intervertebral disc of lumbar region with discogenic  back pain and lower extremity pain   [place] [facility] Duanne Butler DASEN, MD   3 months ago Acute bilateral low back pain without sciatica   [place] [facility] Aletha Bene, MD   6 months ago Sensation of plugged ear on both sides   [place] [facility] Aletha Bene, MD

## 2024-05-16 ENCOUNTER — Other Ambulatory Visit: Payer: Self-pay | Admitting: Family Medicine

## 2024-05-21 ENCOUNTER — Other Ambulatory Visit: Payer: Self-pay | Admitting: Family Medicine

## 2024-05-21 MED ORDER — FUROSEMIDE 40 MG PO TABS: 40.0000 mg | ORAL_TABLET | Freq: Every day | ORAL | 3 refills | Status: AC

## 2024-05-23 ENCOUNTER — Other Ambulatory Visit: Payer: Self-pay | Admitting: Family Medicine

## 2024-05-23 NOTE — Telephone Encounter (Signed)
 Requested medication (s) are due for refill today: Yes  Requested medication (s) are on the active medication list: Yes  Last refill:  04/30/24  Future visit scheduled: No  Notes to clinic:  Unable to refill per protocol, cannot delegate.      Requested Prescriptions  Pending Prescriptions Disp Refills   meclizine  (ANTIVERT ) 25 MG tablet [Pharmacy Med Name: Meclizine  HCl 25 MG Oral Tablet] 30 tablet 0    Sig: TAKE 1 TABLET BY MOUTH THREE TIMES DAILY AS NEEDED FOR DIZZINESS     Not Delegated - Gastroenterology: Antiemetics Failed - 05/23/2024  4:56 PM      Failed - This refill cannot be delegated      Passed - Valid encounter within last 6 months    Recent Outpatient Visits           1 week ago Skin tear of right lower leg without complication, subsequent encounter   [place] [facility] Kayla Jeoffrey RAMAN, FNP   3 months ago Primary hypertension   [place] [facility] Duanne Butler DASEN, MD   3 months ago Degeneration of intervertebral disc of lumbar region with discogenic back pain and lower extremity pain   [place] [facility] Duanne Butler DASEN, MD   4 months ago Acute bilateral low back pain without sciatica   [facility] Aletha Bene, MD   6 months ago Sensation of plugged ear on both sides   [place] [facility] Aletha Bene, MD

## 2024-05-27 ENCOUNTER — Other Ambulatory Visit (HOSPITAL_BASED_OUTPATIENT_CLINIC_OR_DEPARTMENT_OTHER): Payer: Self-pay | Admitting: Pulmonary Disease

## 2024-06-01 ENCOUNTER — Other Ambulatory Visit: Payer: Self-pay | Admitting: Family Medicine

## 2024-06-01 ENCOUNTER — Ambulatory Visit: Payer: Self-pay

## 2024-06-01 MED ORDER — PROMETHAZINE HCL 25 MG PO TABS: 25.0000 mg | ORAL_TABLET | Freq: Four times a day (QID) | ORAL | 0 refills | Status: AC | PRN

## 2024-06-01 NOTE — Telephone Encounter (Signed)
 FYI Only or Action Required?: FYI only for provider: ED advised.  Patient was last seen in primary care on 05/10/2024 by Kayla Jeoffrey RAMAN, FNP.  Called Nurse Triage reporting Vomiting.  Symptoms began today.  Interventions attempted: Nothing.  Symptoms are: unchanged.  Triage Disposition: Go to ED Now (or PCP Triage)  Patient/caregiver understands and will follow disposition?: No  Reason for Disposition  [1] MODERATE to SEVERE vomiting (e.g., 3 or more times/day) AND [2] age > 60 years  Answer Assessment - Initial Assessment Questions Pt reports throwing up a lot and noticed a big chunk of blood. Advised ED. Patient stated he will see if he throws up 1 more time and if he does and it has blood in it he will go. Educated patient on importance of being evaluated in ED. Patient stated he is going to lay down for awhile and then see if he gets better, if not he will go. Stated he will not ride in an ambulance due to the cost.  1. VOMITING SEVERITY: How many times have you vomited in the past 24 hours?      Started at 4am, thrown up more than 3 times.  2. ONSET: When did the vomiting begin?      This morning  3. FLUIDS: What fluids or food have you vomited up today? Have you been able to keep any fluids down?     Tried drinking Alka-seltzer and threw that up   4. ABDOMEN PAIN: Are your having any abdomen pain? If Yes : How bad is it and what does it feel like? (e.g., crampy, dull, intermittent, constant)      *No Answer* 5. DIARRHEA: Is there any diarrhea? If Yes, ask: How many times today?      Yes   6. CONTACTS: Is there anyone else in the family with the same symptoms?      *No Answer* 7. CAUSE: What do you think is causing your vomiting?     *No Answer* 8. HYDRATION STATUS: Any signs of dehydration? (e.g., dry mouth [not only dry lips], too weak to stand) When did you last urinate?     *No Answer* 9. OTHER SYMPTOMS: Do you have any other symptoms?  (e.g., fever, headache, vertigo, vomiting blood or coffee grounds, recent head injury)     *No Answer*  Protocols used: Vomiting-A-AH  Copied from CRM #[id number]. Topic: Clinical - Red Word Triage >> Jun 01, 2024 12:57 PM Brendan Hopkins wrote: Woke up at 4am with body aches throwing up and diarrhea. He threw up again says it was really bad not long ago. Had hunk a blood in it.

## 2024-06-11 ENCOUNTER — Other Ambulatory Visit: Payer: Self-pay | Admitting: Family Medicine

## 2024-06-13 ENCOUNTER — Ambulatory Visit (HOSPITAL_BASED_OUTPATIENT_CLINIC_OR_DEPARTMENT_OTHER)
Admission: RE | Admit: 2024-06-13 | Discharge: 2024-06-13 | Disposition: A | Discharge status: Home / Self Care | Source: Ambulatory Visit

## 2024-06-13 DIAGNOSIS — R918 Other nonspecific abnormal finding of lung field: Secondary | ICD-10-CM | POA: Diagnosis present

## 2024-06-28 ENCOUNTER — Other Ambulatory Visit: Payer: Self-pay | Admitting: Family Medicine

## 2024-06-29 ENCOUNTER — Telehealth: Payer: Self-pay

## 2024-06-29 NOTE — Telephone Encounter (Signed)
 Copied from CRM #[id number]. Topic: Clinical - Medical Advice >> Jun 29, 2024  2:58 PM Ivette P wrote: Reason for CRM: pt been hearing that the beet stuff is helping with the heart.   Pt would like for Ronal Bradley to call him to see if he should take the Beet stuff.

## 2024-08-20 ENCOUNTER — Ambulatory Visit (HOSPITAL_BASED_OUTPATIENT_CLINIC_OR_DEPARTMENT_OTHER)

## 2024-12-27 ENCOUNTER — Ambulatory Visit
